# Patient Record
Sex: Female | Born: 1965
Health system: Southern US, Community
[De-identification: ages and names within clinical notes are randomized; demographics above are authoritative.]

## PROBLEM LIST (undated history)

## (undated) DIAGNOSIS — I10 Essential (primary) hypertension: Secondary | ICD-10-CM

## (undated) DIAGNOSIS — R7989 Other specified abnormal findings of blood chemistry: Secondary | ICD-10-CM

## (undated) DIAGNOSIS — J45909 Unspecified asthma, uncomplicated: Secondary | ICD-10-CM

## (undated) DIAGNOSIS — E7800 Pure hypercholesterolemia, unspecified: Secondary | ICD-10-CM

## (undated) DIAGNOSIS — I351 Nonrheumatic aortic (valve) insufficiency: Secondary | ICD-10-CM

## (undated) DIAGNOSIS — E119 Type 2 diabetes mellitus without complications: Secondary | ICD-10-CM

## (undated) DIAGNOSIS — Z95810 Presence of automatic (implantable) cardiac defibrillator: Secondary | ICD-10-CM

## (undated) DIAGNOSIS — Z950 Presence of cardiac pacemaker: Secondary | ICD-10-CM

## (undated) DIAGNOSIS — R778 Other specified abnormalities of plasma proteins: Secondary | ICD-10-CM

## (undated) HISTORY — PX: INSERT / REPLACE / REMOVE PACEMAKER: SUR710

## (undated) HISTORY — DX: Other specified abnormal findings of blood chemistry: R79.89

## (undated) HISTORY — DX: Other specified abnormalities of plasma proteins: R77.8

## (undated) HISTORY — PX: APPENDECTOMY: SHX54

## (undated) HISTORY — PX: PACEMAKER IMPLANT: EP1218

## (undated) HISTORY — PX: SINUS EXPLORATION: SHX5214

## (undated) HISTORY — PX: TUBAL LIGATION: SHX77

---
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
## 2006-09-13 DIAGNOSIS — I10 Essential (primary) hypertension: Secondary | ICD-10-CM | POA: Insufficient documentation

## 2006-09-13 DIAGNOSIS — J45909 Unspecified asthma, uncomplicated: Secondary | ICD-10-CM

## 2006-09-13 HISTORY — DX: Unspecified asthma, uncomplicated: J45.909

## 2012-10-10 ENCOUNTER — Emergency Department (HOSPITAL_COMMUNITY): Payer: Medicaid - Out of State

## 2012-10-10 ENCOUNTER — Emergency Department (HOSPITAL_COMMUNITY)
Admission: EM | Admit: 2012-10-10 | Discharge: 2012-10-10 | Disposition: A | Discharge status: Home / Self Care | Payer: Medicaid - Out of State | Attending: Emergency Medicine | Admitting: Emergency Medicine

## 2012-10-10 ENCOUNTER — Encounter (HOSPITAL_COMMUNITY): Payer: Self-pay | Admitting: Emergency Medicine

## 2012-10-10 DIAGNOSIS — IMO0002 Reserved for concepts with insufficient information to code with codable children: Secondary | ICD-10-CM

## 2012-10-10 DIAGNOSIS — Y9289 Other specified places as the place of occurrence of the external cause: Secondary | ICD-10-CM | POA: Insufficient documentation

## 2012-10-10 DIAGNOSIS — Z79899 Other long term (current) drug therapy: Secondary | ICD-10-CM | POA: Insufficient documentation

## 2012-10-10 DIAGNOSIS — Z8679 Personal history of other diseases of the circulatory system: Secondary | ICD-10-CM | POA: Insufficient documentation

## 2012-10-10 DIAGNOSIS — E7800 Pure hypercholesterolemia, unspecified: Secondary | ICD-10-CM | POA: Insufficient documentation

## 2012-10-10 DIAGNOSIS — I10 Essential (primary) hypertension: Secondary | ICD-10-CM | POA: Insufficient documentation

## 2012-10-10 DIAGNOSIS — Z791 Long term (current) use of non-steroidal anti-inflammatories (NSAID): Secondary | ICD-10-CM | POA: Insufficient documentation

## 2012-10-10 DIAGNOSIS — Z7982 Long term (current) use of aspirin: Secondary | ICD-10-CM | POA: Insufficient documentation

## 2012-10-10 DIAGNOSIS — Y93G1 Activity, food preparation and clean up: Secondary | ICD-10-CM | POA: Insufficient documentation

## 2012-10-10 DIAGNOSIS — I351 Nonrheumatic aortic (valve) insufficiency: Secondary | ICD-10-CM | POA: Insufficient documentation

## 2012-10-10 DIAGNOSIS — S61409A Unspecified open wound of unspecified hand, initial encounter: Secondary | ICD-10-CM | POA: Insufficient documentation

## 2012-10-10 DIAGNOSIS — W268XXA Contact with other sharp object(s), not elsewhere classified, initial encounter: Secondary | ICD-10-CM | POA: Insufficient documentation

## 2012-10-10 DIAGNOSIS — E119 Type 2 diabetes mellitus without complications: Secondary | ICD-10-CM | POA: Insufficient documentation

## 2012-10-10 HISTORY — DX: Type 2 diabetes mellitus without complications: E11.9

## 2012-10-10 HISTORY — DX: Nonrheumatic aortic (valve) insufficiency: I35.1

## 2012-10-10 HISTORY — DX: Pure hypercholesterolemia, unspecified: E78.00

## 2012-10-10 HISTORY — DX: Essential (primary) hypertension: I10

## 2012-10-10 NOTE — ED Provider Notes (Signed)
History    This chart was scribed for Jamse Mead, PA working with Janice Norrie, MD by ED Scribe, Leeroy Cha. This patient was seen in room TR11C/TR11C and the patient's care was started at 7:00 PM.   CSN: [id number]  Arrival date & time 10/10/12  1622   None     Chief Complaint  Patient presents with  . Extremity Laceration    (Consider location/radiation/quality/duration/timing/severity/associated sxs/prior treatment) HPI HPI Comments: Lisa Poole is a 47 y.o. female with h/o DM, HTN, and aortic insufficiency who presents to the Emergency Department complaining of moderate constant pain due to a laceration earlier today. Pt states that she was washing dishes in the sink and pulled her hand out and noticed a laceration on her left hand that she received from a broken glass. Bleeding is currently controlled in the ED. She reports a mild tingling sensation with throbbing pain. She gives the pain a 8/10 as of now. Pt takes aspirin (81mg ) daily. Pt had a previous surgery on her right hand and is left handed. Pt denies numbness in her left hand, fever, chills, cough, nausea, vomiting, diarrhea, SOB, weakness, and any other associated symptoms. Pt's Tetanus is utd and she is originally is from [place]. Here due to a sick family member.   Past Medical History  Diagnosis Date  . Hypertension   . Diabetes mellitus without complication   . Hypercholesteremia   . Aortic insufficiency     History reviewed. No pertinent past surgical history.  History reviewed. No pertinent family history.  History  Substance Use Topics  . Smoking status: Never Smoker   . Smokeless tobacco: Not on file  . Alcohol Use: Yes     Comment: occ    OB History   Grav Para Term Preterm Abortions TAB SAB Ect Mult Living                  Review of Systems  Constitutional: Negative for fever and chills.  Musculoskeletal: Negative for arthralgias.  Skin: Positive for wound.  Neurological: Negative for  dizziness and numbness.  All other systems reviewed and are negative.    Allergies  Lisinopril  Home Medications   Current Outpatient Rx  Name  Route  Sig  Dispense  Refill  . amitriptyline (ELAVIL) 50 MG tablet   Oral   Take 50 mg by mouth at bedtime.         Marland Kitchen aspirin EC 81 MG tablet   Oral   Take 81 mg by mouth daily.         . hydrochlorothiazide (HYDRODIURIL) 25 MG tablet   Oral   Take 25 mg by mouth daily.         Marland Kitchen losartan (COZAAR) 25 MG tablet   Oral   Take 25 mg by mouth daily.         Marland Kitchen lovastatin (MEVACOR) 20 MG tablet   Oral   Take 20 mg by mouth at bedtime.         . meloxicam (MOBIC) 15 MG tablet   Oral   Take 15 mg by mouth daily.         . metoprolol (LOPRESSOR) 50 MG tablet   Oral   Take 50 mg by mouth 2 (two) times daily.         Marland Kitchen venlafaxine (EFFEXOR) 100 MG tablet   Oral   Take 300 mg by mouth every morning.  BP 122/92  Pulse 76  Temp(Src) 98 F (36.7 C) (Oral)  Resp 18  SpO2 96%  Physical Exam  Nursing note and vitals reviewed. Constitutional: She is oriented to person, place, and time. She appears well-developed and well-nourished. No distress.  HENT:  Head: Normocephalic and atraumatic.  Mouth/Throat: Oropharynx is clear and moist. No oropharyngeal exudate.  Eyes: Conjunctivae and EOM are normal. Pupils are equal, round, and reactive to light.  Neck: Normal range of motion. Neck supple. No tracheal deviation present.  Cardiovascular: Normal rate, regular rhythm, normal heart sounds and intact distal pulses.  Exam reveals no gallop.   No murmur heard. Pulmonary/Chest: Effort normal and breath sounds normal. No respiratory distress. She has no wheezes. She has no rales.  Abdominal: Soft. Bowel sounds are normal. There is no tenderness.  Musculoskeletal: Normal range of motion. She exhibits tenderness.  Lymphadenopathy:    She has no cervical adenopathy.  Neurological: She is alert and oriented to  person, place, and time. She has normal reflexes. No cranial nerve deficit. She exhibits normal muscle tone. Coordination normal.  Sensation intact with dull and sharp sensations.  Skin: Skin is warm and dry.  U-shaped laceration to the base of left pinky finger on dorsal aspect of left hand  Psychiatric: She has a normal mood and affect. Her behavior is normal.    ED Course  Procedures (including critical care time) DIAGNOSTIC STUDIES: Oxygen Saturation is 96% on room air, adequate by my interpretation.    COORDINATION OF CARE: 2:20 AM Discussed ED treatment with pt and pt agrees.   LACERATION REPAIR Performed by: Jamse Mead Authorized by: Jamse Mead Consent: Verbal consent obtained. Risks and benefits: risks, benefits and alternatives were discussed Consent given by: patient Patient identity confirmed: provided demographic data Prepped and Draped in normal sterile fashion Wound explored  Laceration Location: lateral aspect of left hand bas eof pinky  Laceration Length: 2cm U-shaped  No Foreign Bodies seen or palpated  Anesthesia: local infiltration  Local anesthetic: lidocaine 2% without epinephrine  Anesthetic total: 6 ml  Irrigation method: syringe Amount of cleaning: standard  Skin closure: approximate  Number of sutures: 7  Technique: single interrupted, 4-0 ethilon  Patient tolerance: Patient tolerated the procedure well with no immediate complications.   Labs Reviewed - No data to display Dg Hand Complete Left  10/10/2012   *RADIOLOGY REPORT*  Clinical Data: Laceration of the medial surface of the hand.  LEFT HAND - COMPLETE 3+ VIEW  Comparison: None.  Findings: No radiopaque foreign body is identified.  No fracture. Alignment of the left hand is anatomic.  Visualized carpal bones appear normal.  Bandages present over the ulnar aspect of the fifth MCP joint.  IMPRESSION: No acute osseous abnormality or radiopaque foreign body.   Original Report  Authenticated By: Dereck Ligas, M.D.     1. Laceration       MDM  I personally performed the services described in this documentation, which was scribed in my presence. The recorded information has been reviewed and is accurate.  No neurovascular damage noted. Strength 5/5 to left hand and fingers. Full ROM to the left hand - mild discomfort with motion to the left pinky secondary to pain. Patient tolerate procedure well. Good hemostasis. Negative foreign bodies noted. Tendon and deep tendon involvement not noted. Patient stable, afebrile. Discharged patient. Discussed wound care. Discussed removal of sutures in 7-8 days. Referred to hand surgeon. Discussed with patient to monitor symptoms and if symptoms are to worsen  or change to report back to the ED. Patient agreed to plan of care, understood, all questions answered.    [employer], PA-C 10/11/12 0221

## 2012-10-10 NOTE — ED Notes (Signed)
Pt c/o left hand laceration from glass; bleeding controlled

## 2012-10-11 NOTE — ED Provider Notes (Signed)
Medical screening examination/treatment/procedure(s) were performed by non-physician practitioner and as supervising physician I was immediately available for consultation/collaboration. Rolland Porter, MD, Abram Sander   Janice Norrie, MD 10/11/12 1240

## 2012-10-15 ENCOUNTER — Encounter (HOSPITAL_COMMUNITY): Payer: Self-pay | Admitting: Adult Health

## 2012-10-15 ENCOUNTER — Emergency Department (HOSPITAL_COMMUNITY)
Admission: EM | Admit: 2012-10-15 | Discharge: 2012-10-15 | Disposition: A | Discharge status: Home / Self Care | Payer: Medicaid - Out of State | Attending: Emergency Medicine | Admitting: Emergency Medicine

## 2012-10-15 DIAGNOSIS — E119 Type 2 diabetes mellitus without complications: Secondary | ICD-10-CM | POA: Insufficient documentation

## 2012-10-15 DIAGNOSIS — Z79899 Other long term (current) drug therapy: Secondary | ICD-10-CM | POA: Insufficient documentation

## 2012-10-15 DIAGNOSIS — R209 Unspecified disturbances of skin sensation: Secondary | ICD-10-CM | POA: Insufficient documentation

## 2012-10-15 DIAGNOSIS — Z4802 Encounter for removal of sutures: Secondary | ICD-10-CM

## 2012-10-15 DIAGNOSIS — Z7982 Long term (current) use of aspirin: Secondary | ICD-10-CM | POA: Insufficient documentation

## 2012-10-15 DIAGNOSIS — Z8679 Personal history of other diseases of the circulatory system: Secondary | ICD-10-CM | POA: Insufficient documentation

## 2012-10-15 DIAGNOSIS — Z4801 Encounter for change or removal of surgical wound dressing: Secondary | ICD-10-CM | POA: Insufficient documentation

## 2012-10-15 DIAGNOSIS — I10 Essential (primary) hypertension: Secondary | ICD-10-CM | POA: Insufficient documentation

## 2012-10-15 DIAGNOSIS — E7800 Pure hypercholesterolemia, unspecified: Secondary | ICD-10-CM | POA: Insufficient documentation

## 2012-10-15 DIAGNOSIS — Z791 Long term (current) use of non-steroidal anti-inflammatories (NSAID): Secondary | ICD-10-CM | POA: Insufficient documentation

## 2012-10-15 NOTE — ED Provider Notes (Signed)
History  This chart was scribed for non-physician practitioner Abigail Butts, PA-C working with Johnna Acosta, MD, by Truddie Coco, ED Scribe. This patient was seen in room TR08C/TR08C and the patient's care was started at 5:30 PM   CSN: [id number]  Arrival date & time 10/15/12  1648   First MD Initiated Contact with Patient 10/15/12 1715      Chief Complaint  Patient presents with  . Hand Pain     The history is provided by the patient. No language interpreter was used.  HPI Comments: Lisa Poole is a 47 y.o. female who presents to the Emergency Department complaining of left fifth finger numbness and pain that started two days ago which she reports she feels when touching the finger and bending the finger.  Pt had sutures put in place to the base of the left fifth finger five days ago after she cut the hand on a piece of glass.  She denies constant tingling.  Pt has not followed up with a hand specialist because she is visiting from out of town.    Past Medical History  Diagnosis Date  . Hypertension   . Diabetes mellitus without complication   . Hypercholesteremia   . Aortic insufficiency     History reviewed. No pertinent past surgical history.  History reviewed. No pertinent family history.  History  Substance Use Topics  . Smoking status: Never Smoker   . Smokeless tobacco: Not on file  . Alcohol Use: Yes     Comment: occ    OB History   Grav Para Term Preterm Abortions TAB SAB Ect Mult Living                  Review of Systems  Skin:       Sutures in place to dorsal left hand, base of fifth finger.   Neurological: Positive for numbness (left fifth finger).  All other systems reviewed and are negative.    Allergies  Lisinopril  Home Medications   Current Outpatient Rx  Name  Route  Sig  Dispense  Refill  . amitriptyline (ELAVIL) 50 MG tablet   Oral   Take 50 mg by mouth at bedtime.         Marland Kitchen aspirin EC 81 MG tablet   Oral   Take  81 mg by mouth daily.         . hydrochlorothiazide (HYDRODIURIL) 25 MG tablet   Oral   Take 25 mg by mouth daily.         Marland Kitchen losartan (COZAAR) 25 MG tablet   Oral   Take 25 mg by mouth daily.         Marland Kitchen lovastatin (MEVACOR) 20 MG tablet   Oral   Take 20 mg by mouth at bedtime.         . meloxicam (MOBIC) 15 MG tablet   Oral   Take 15 mg by mouth daily.         . metoprolol (LOPRESSOR) 50 MG tablet   Oral   Take 50 mg by mouth 2 (two) times daily.         Marland Kitchen venlafaxine (EFFEXOR) 100 MG tablet   Oral   Take 300 mg by mouth every morning.           BP 143/92  Pulse 77  Temp(Src) 97.8 F (36.6 C) (Oral)  Resp 16  SpO2 98%  Physical Exam  Nursing note and vitals reviewed. Constitutional: She appears  well-developed and well-nourished. No distress.  HENT:  Head: Normocephalic and atraumatic.  Mouth/Throat: Oropharynx is clear and moist. No oropharyngeal exudate.  Eyes: Conjunctivae are normal. No scleral icterus.  Neck: Normal range of motion. Neck supple.  Cardiovascular: Normal rate, regular rhythm and intact distal pulses.   Pulmonary/Chest: Effort normal and breath sounds normal. No respiratory distress. She has no wheezes.  Abdominal: Soft. Bowel sounds are normal. She exhibits no mass. There is no tenderness. There is no rebound and no guarding.  Musculoskeletal: Normal range of motion. She exhibits no edema.  Neurological: She is alert.  Speech is clear and goal oriented Moves extremities without ataxia Sensation intact to the distal left pinky finger.   Skin: Skin is warm and dry. She is not diaphoretic.  Well healed 2 cm U-shaped laceration.  No erythema or drainage.  Minimal pain to palpation.    Psychiatric: She has a normal mood and affect.    ED Course  Procedures  DIAGNOSTIC STUDIES: Oxygen Saturation is 98% on room air, normal by my interpretation.    COORDINATION OF CARE:  5:33 PM Discussed course of care with pt which includes  suture removal.  Pt understands and agrees.   SUTURE REMOVAL Performed by: Abigail Butts, PA-C Authorized by: Abigail Butts, PA-C Consent: Verbal consent obtained. Consent given by: patient Required items: required blood products, implants, devices, and special equipment available  Time out: Immediately prior to procedure a "time out" was called to verify the correct patient, procedure, equipment, support staff and site/side marked as required. Location: dorsal left hand base of fifth finger Wound Appearance: well healing  Sutures Removed: 7 sutures  Post-removal: no erythema or drainage.  Patient tolerance: Patient tolerated the procedure well with no immediate complications.   Labs Reviewed - No data to display  No results found.   1. Visit for suture removal       MDM  Lisa Poole presents for staple/suture removal and wound check as above. Procedure tolerated well. Vitals normal, no signs of infection. Scar minimization & return precautions given at dc. I have also discussed reasons to return immediately to the ER.  Patient expresses understanding and agrees with plan.  I personally performed the services described in this documentation, which was scribed in my presence. The recorded information has been reviewed and is accurate.        Jarrett Soho Kasyn Stouffer, PA-C 10/15/12 1736

## 2012-10-15 NOTE — ED Notes (Signed)
Presents with left pinky numbness/pain. Pt had stitches placed on June 2nd from injury on left knuckle of pinky. The finger pain began 2 days ago. Pain is described as shooting and numbness and began Tuesday. Touch makes pain worse. CMS intact

## 2012-10-16 NOTE — ED Provider Notes (Signed)
  Medical screening examination/treatment/procedure(s) were performed by non-physician practitioner and as supervising physician I was immediately available for consultation/collaboration.    Johnna Acosta, MD 10/16/12 2013

## 2013-08-01 DIAGNOSIS — M19019 Primary osteoarthritis, unspecified shoulder: Secondary | ICD-10-CM | POA: Insufficient documentation

## 2014-05-10 ENCOUNTER — Encounter (HOSPITAL_COMMUNITY): Payer: Self-pay | Admitting: *Deleted

## 2014-05-10 ENCOUNTER — Emergency Department (HOSPITAL_COMMUNITY)
Admission: EM | Admit: 2014-05-10 | Discharge: 2014-05-10 | Disposition: A | Discharge status: Home / Self Care | Payer: Medicare Other | Attending: Emergency Medicine | Admitting: Emergency Medicine

## 2014-05-10 DIAGNOSIS — Z7982 Long term (current) use of aspirin: Secondary | ICD-10-CM | POA: Diagnosis not present

## 2014-05-10 DIAGNOSIS — Z79899 Other long term (current) drug therapy: Secondary | ICD-10-CM | POA: Diagnosis not present

## 2014-05-10 DIAGNOSIS — E119 Type 2 diabetes mellitus without complications: Secondary | ICD-10-CM | POA: Diagnosis not present

## 2014-05-10 DIAGNOSIS — Y9389 Activity, other specified: Secondary | ICD-10-CM | POA: Diagnosis not present

## 2014-05-10 DIAGNOSIS — Z791 Long term (current) use of non-steroidal anti-inflammatories (NSAID): Secondary | ICD-10-CM | POA: Insufficient documentation

## 2014-05-10 DIAGNOSIS — E780 Pure hypercholesterolemia: Secondary | ICD-10-CM | POA: Diagnosis not present

## 2014-05-10 DIAGNOSIS — Y9289 Other specified places as the place of occurrence of the external cause: Secondary | ICD-10-CM | POA: Diagnosis not present

## 2014-05-10 DIAGNOSIS — R600 Localized edema: Secondary | ICD-10-CM | POA: Diagnosis present

## 2014-05-10 DIAGNOSIS — S030XXA Dislocation of jaw, initial encounter: Secondary | ICD-10-CM | POA: Diagnosis not present

## 2014-05-10 DIAGNOSIS — S0300XA Dislocation of jaw, unspecified side, initial encounter: Secondary | ICD-10-CM

## 2014-05-10 DIAGNOSIS — Y998 Other external cause status: Secondary | ICD-10-CM | POA: Insufficient documentation

## 2014-05-10 DIAGNOSIS — I10 Essential (primary) hypertension: Secondary | ICD-10-CM | POA: Diagnosis not present

## 2014-05-10 DIAGNOSIS — X58XXXA Exposure to other specified factors, initial encounter: Secondary | ICD-10-CM | POA: Insufficient documentation

## 2014-05-10 NOTE — ED Notes (Signed)
Pt states that she has had rt sided jaw swelling radiating to her ear for 7 days; pt c/o tenderness to palpation but denies any other discomfort; pt states that she thought something may have bitten her but has not seen a bite mark; pt concerned that has persisted for 7 days

## 2014-05-10 NOTE — ED Provider Notes (Signed)
CSN: [id number]     Arrival date & time 05/10/14  1955 History  This chart was scribed for non-physician practitioner, Charlann Lange, PA-C,working with Mirna Mires, MD, by Marlowe Kays, ED Scribe. This patient was seen in room [place] and the patient's care was started at 8:33 PM.  Chief Complaint  Patient presents with  . Facial Swelling   The history is provided by the patient. No language interpreter was used.    HPI Comments:  Lisa Poole is a 48 y.o. obese female who presents to the Emergency Department complaining of right-sided facial swelling that began approximately 7 days ago. She reports associated swelling around her right ear as well. She states opening her jaw causes moderate pain but is unpainful other than that. Pt has not done anything to treat her symptoms besides her normal Meloxicam she takes for joint pain. Denies alleviating factors. Denies fever, chills, dental problems, nausea or vomiting. Denies trauma, injury or fall. PMH of HTN, DM and hypercholesteremia.  Past Medical History  Diagnosis Date  . Hypertension   . Diabetes mellitus without complication   . Hypercholesteremia   . Aortic insufficiency    Past Surgical History  Procedure Laterality Date  . Tubal ligation    . Appendectomy    . Sinus exploration     No family history on file. History  Substance Use Topics  . Smoking status: Never Smoker   . Smokeless tobacco: Not on file  . Alcohol Use: Yes     Comment: occ   OB History    No data available     Review of Systems  Constitutional: Negative for fever and chills.  HENT: Positive for facial swelling. Negative for dental problem.   Gastrointestinal: Negative for nausea and vomiting.  All other systems reviewed and are negative.   Allergies  Lisinopril and Lotensin  Home Medications   Prior to Admission medications   Medication Sig Start Date End Date Taking? Authorizing Provider  amitriptyline (ELAVIL) 50 MG tablet Take  50 mg by mouth at bedtime.    Historical Provider, MD  aspirin EC 81 MG tablet Take 81 mg by mouth daily.    Historical Provider, MD  hydrochlorothiazide (HYDRODIURIL) 25 MG tablet Take 25 mg by mouth daily.    Historical Provider, MD  losartan (COZAAR) 25 MG tablet Take 25 mg by mouth daily.    Historical Provider, MD  lovastatin (MEVACOR) 20 MG tablet Take 20 mg by mouth at bedtime.    Historical Provider, MD  meloxicam (MOBIC) 15 MG tablet Take 15 mg by mouth daily.    Historical Provider, MD  metoprolol (LOPRESSOR) 50 MG tablet Take 50 mg by mouth 2 (two) times daily.    Historical Provider, MD  venlafaxine (EFFEXOR) 100 MG tablet Take 300 mg by mouth every morning.    Historical Provider, MD   Triage Vitals: BP 142/89 mmHg  Pulse 78  Temp(Src) 98.3 F (36.8 C) (Oral)  Resp 18  Ht 5' 0.5" (1.537 m)  Wt 214 lb (97.07 kg)  BMI 41.09 kg/m2  SpO2 96%  LMP 04/29/2014 Physical Exam  Constitutional: She is oriented to person, place, and time. She appears well-developed and well-nourished.  HENT:  Head: Normocephalic and atraumatic.  Right TMJ swelling without redness. Joint tender. No malocclusion. Right upper and lower molars absent, no intraoral tenderness. TM's clear bilaterally.   Eyes: EOM are normal.  Neck: Normal range of motion.  Cardiovascular: Normal rate.   Pulmonary/Chest: Effort normal.  Musculoskeletal: Normal range of motion.  Lymphadenopathy:    She has no cervical adenopathy.  Neurological: She is alert and oriented to person, place, and time.  Skin: Skin is warm and dry.  Psychiatric: She has a normal mood and affect. Her behavior is normal.  Nursing note and vitals reviewed.   ED Course  Procedures (including critical care time) DIAGNOSTIC STUDIES: Oxygen Saturation is 96% on RA, adequate by my interpretation.   COORDINATION OF CARE: 8:36 PM- Advised pt to apply warm compresses and continue Meloxicam. Will give referral to oral surgeon. Pt verbalizes  understanding and agrees to plan.  Medications - No data to display  Labs Review Labs Reviewed - No data to display  Imaging Review No results found.   EKG Interpretation None      MDM   Final diagnoses:  None    1. TMJ syndrome  Uncomplicated TMJ. No evidence of infection. No dental issues. Already taking max dose Mobic. Encouraged oral surgery follow up, warm compresses.   I personally performed the services described in this documentation, which was scribed in my presence. The recorded information has been reviewed and is accurate.    Dewaine Oats, PA-C 05/10/14 2051  Mirna Mires, MD 05/10/14 2142

## 2014-05-10 NOTE — Discharge Instructions (Signed)
Heat Therapy Heat therapy can help ease sore, stiff, injured, and tight muscles and joints. Heat relaxes your muscles, which may help ease your pain.  RISKS AND COMPLICATIONS If you have any of the following conditions, do not use heat therapy unless your health care provider has approved:  Poor circulation.  Healing wounds or scarred skin in the area being treated.  Diabetes, heart disease, or high blood pressure.  Not being able to feel (numbness) the area being treated.  Unusual swelling of the area being treated.  Active infections.  Blood clots.  Cancer.  Inability to communicate pain. This may include young children and people who have problems with their brain function (dementia).  Pregnancy. Heat therapy should only be used on old, pre-existing, or long-lasting (chronic) injuries. Do not use heat therapy on new injuries unless directed by your health care provider. HOW TO USE HEAT THERAPY There are several different kinds of heat therapy, including:  Moist heat pack.  Warm water bath.  Hot water bottle.  Electric heating pad.  Heated gel pack.  Heated wrap.  Electric heating pad. Use the heat therapy method suggested by your health care provider. Follow your health care provider's instructions on when and how to use heat therapy. GENERAL HEAT THERAPY RECOMMENDATIONS  Do not sleep while using heat therapy. Only use heat therapy while you are awake.  Your skin may turn pink while using heat therapy. Do not use heat therapy if your skin turns red.  Do not use heat therapy if you have new pain.  High heat or long exposure to heat can cause burns. Be careful when using heat therapy to avoid burning your skin.  Do not use heat therapy on areas of your skin that are already irritated, such as with a rash or sunburn. SEEK MEDICAL CARE IF:  You have blisters, redness, swelling, or numbness.  You have new pain.  Your pain is worse. MAKE SURE  YOU:  Understand these instructions.  Will watch your condition.  Will get help right away if you are not doing well or get worse. Document Released: 07/20/2011 Document Revised: 09/11/2013 Document Reviewed: 06/20/2013 [facility] Patient Information 2015 [place], [place]. This information is not intended to replace advice given to you by your health care provider. Make sure you discuss any questions you have with your health care provider. Temporomandibular Problems  Temporomandibular joint (TMJ) dysfunction means there are problems with the joint between your jaw and your skull. This is a joint lined by cartilage like other joints in your body but also has a small disc in the joint which keeps the bones from rubbing on each other. These joints are like other joints and can get inflamed (sore) from arthritis and other problems. When this joint gets sore, it can cause headaches and pain in the jaw and the face. CAUSES  Usually the arthritic types of problems are caused by soreness in the joint. Soreness in the joint can also be caused by overuse. This may come from grinding your teeth. It may also come from mis-alignment in the joint. DIAGNOSIS Diagnosis of this condition can often be made by history and exam. Sometimes your caregiver may need X-rays or an MRI scan to determine the exact cause. It may be necessary to see your dentist to determine if your teeth and jaws are lined up correctly. TREATMENT  Most of the time this problem is not serious; however, sometimes it can persist (become chronic). When this happens medications that will cut  down on inflammation (soreness) help. Sometimes a shot of cortisone into the joint will be helpful. If your teeth are not aligned it may help for your dentist to make a splint for your mouth that can help this problem. If no physical problems can be found, the problem may come from tension. If tension is found to be the cause, biofeedback or relaxation techniques  may be helpful. HOME CARE INSTRUCTIONS   Later in the day, applications of ice packs may be helpful. Ice can be used in a plastic bag with a towel around it to prevent frostbite to skin. This may be used about every 2 hours for 20 to 30 minutes, as needed while awake, or as directed by your caregiver.  Only take over-the-counter or prescription medicines for pain, discomfort, or fever as directed by your caregiver.  If physical therapy was prescribed, follow your caregiver's directions.  Wear mouth appliances as directed if they were given. Document Released: 01/20/2001 Document Revised: 07/20/2011 Document Reviewed: 04/29/2008 [facility] Patient Information 2015 [place], [place]. This information is not intended to replace advice given to you by your health care provider. Make sure you discuss any questions you have with your health care provider.

## 2014-12-04 ENCOUNTER — Other Ambulatory Visit: Payer: Self-pay

## 2014-12-04 DIAGNOSIS — Z1231 Encounter for screening mammogram for malignant neoplasm of breast: Secondary | ICD-10-CM

## 2014-12-06 ENCOUNTER — Other Ambulatory Visit: Payer: Self-pay | Admitting: Internal Medicine

## 2014-12-06 DIAGNOSIS — E2839 Other primary ovarian failure: Secondary | ICD-10-CM

## 2014-12-13 ENCOUNTER — Ambulatory Visit
Admission: RE | Admit: 2014-12-13 | Discharge: 2014-12-13 | Disposition: A | Discharge status: Home / Self Care | Payer: Medicare Other | Source: Ambulatory Visit

## 2014-12-13 DIAGNOSIS — Z1231 Encounter for screening mammogram for malignant neoplasm of breast: Secondary | ICD-10-CM

## 2015-05-12 HISTORY — PX: ROTATOR CUFF REPAIR: SHX139

## 2015-12-30 ENCOUNTER — Emergency Department (HOSPITAL_COMMUNITY)
Admission: EM | Admit: 2015-12-30 | Discharge: 2015-12-30 | Disposition: A | Discharge status: Home / Self Care | Payer: Medicare Other | Attending: Emergency Medicine | Admitting: Emergency Medicine

## 2015-12-30 ENCOUNTER — Encounter (HOSPITAL_COMMUNITY): Payer: Self-pay | Admitting: *Deleted

## 2015-12-30 DIAGNOSIS — I10 Essential (primary) hypertension: Secondary | ICD-10-CM | POA: Insufficient documentation

## 2015-12-30 DIAGNOSIS — N760 Acute vaginitis: Secondary | ICD-10-CM | POA: Insufficient documentation

## 2015-12-30 DIAGNOSIS — Z79899 Other long term (current) drug therapy: Secondary | ICD-10-CM | POA: Diagnosis not present

## 2015-12-30 DIAGNOSIS — E119 Type 2 diabetes mellitus without complications: Secondary | ICD-10-CM | POA: Insufficient documentation

## 2015-12-30 DIAGNOSIS — N898 Other specified noninflammatory disorders of vagina: Secondary | ICD-10-CM | POA: Diagnosis present

## 2015-12-30 DIAGNOSIS — Z7982 Long term (current) use of aspirin: Secondary | ICD-10-CM | POA: Insufficient documentation

## 2015-12-30 LAB — URINALYSIS, ROUTINE W REFLEX MICROSCOPIC
Bilirubin Urine: NEGATIVE
Glucose, UA: >1000 mg/dL — AB
KETONES UR: NEGATIVE mg/dL
NITRITE: NEGATIVE
PH: 5.5 (ref 5.0–8.0)
PROTEIN: NEGATIVE mg/dL
Specific Gravity, Urine: 1.036 — ABNORMAL HIGH (ref 1.005–1.030)

## 2015-12-30 LAB — URINE MICROSCOPIC-ADD ON

## 2015-12-30 LAB — WET PREP, GENITAL
Clue Cells Wet Prep HPF POC: NONE SEEN
Sperm: NONE SEEN
Trich, Wet Prep: NONE SEEN
Yeast Wet Prep HPF POC: NONE SEEN

## 2015-12-30 MED ORDER — FLUCONAZOLE 100 MG PO TABS
150.0000 mg | ORAL_TABLET | Freq: Once | ORAL | Status: AC
  Administered 2015-12-30: 150 mg via ORAL
  Filled 2015-12-30: qty 2

## 2015-12-30 MED ORDER — FLUCONAZOLE 150 MG PO TABS: 150.0000 mg | ORAL_TABLET | ORAL | 0 refills | Status: AC

## 2015-12-30 NOTE — Discharge Instructions (Signed)
Please take the medicine prescribed on Thursday and Sunday if not better. See the [facility] doctor as requested for optimal care - [facility].

## 2015-12-30 NOTE — ED Provider Notes (Signed)
[facility] DEPT Provider Note   CSN: [id number] Arrival date & time: 12/30/15  1303     History   Chief Complaint Chief Complaint  Patient presents with  . Vaginal Itching    HPI Lisa Poole is a 50 y.o. female.  HPI Pt with DM hx comes in with soreness in the vaginal region. Reports that she has constant discomfort in the vaginal region with burning with urination. Pt denies any vaginal discharge, bleeding. No hx of similar pain in the past. No one is accepting her insurance, so she hasnt been able to see a Gyne. Symptoms present for 1 week. No n/v/f/c.  Past Medical History:  Diagnosis Date  . Aortic insufficiency   . Diabetes mellitus without complication ([place])   . Hypercholesteremia   . Hypertension     Patient Active Problem List   Diagnosis Date Noted  . Aortic insufficiency     Past Surgical History:  Procedure Laterality Date  . APPENDECTOMY    . SINUS EXPLORATION    . TUBAL LIGATION      OB History    No data available       Home Medications    Prior to Admission medications   Medication Sig Start Date End Date Taking? Authorizing Provider  aspirin EC 81 MG tablet Take 81 mg by mouth every evening.    Yes Historical Provider, MD  gabapentin (NEURONTIN) 300 MG capsule Take 300 mg by mouth 2 (two) times daily. 10/29/15  Yes Historical Provider, MD  INVOKAMET XR 50-500 MG TB24 Take 1 tablet by mouth 2 (two) times daily. 12/12/15  Yes Historical Provider, MD  losartan-hydrochlorothiazide (HYZAAR) 100-12.5 MG tablet Take 1 tablet by mouth every morning. 12/23/15  Yes Historical Provider, MD  lovastatin (MEVACOR) 20 MG tablet Take 20 mg by mouth at bedtime.   Yes Historical Provider, MD  meloxicam (MOBIC) 15 MG tablet Take 15 mg by mouth daily.   Yes Historical Provider, MD  metoprolol (LOPRESSOR) 50 MG tablet Take 50 mg by mouth 2 (two) times daily.   Yes Historical Provider, MD  OVER THE COUNTER MEDICATION Take 2 tablets by mouth daily. Amberen for  menopause symptoms   Yes Historical Provider, MD  venlafaxine XR (EFFEXOR-XR) 150 MG 24 hr capsule Take 300 mg by mouth every morning. 12/12/15  Yes Historical Provider, MD  fluconazole (DIFLUCAN) 150 MG tablet Take 1 tablet (150 mg total) by mouth every 3 (three) days. 12/30/15 01/01/16  Varney Biles, MD    Family History History reviewed. No pertinent family history.  Social History Social History  Substance Use Topics  . Smoking status: Never Smoker  . Smokeless tobacco: Not on file  . Alcohol use Yes     Comment: occ     Allergies   Ace inhibitors; Benazepril; Lisinopril; Lotensin [benazepril hcl]; and Sulfa antibiotics   Review of Systems Review of Systems  ROS 10 Systems reviewed and are negative for acute change except as noted in the HPI.     Physical Exam Updated Vital Signs BP 110/72   Pulse 64   Temp 97.6 F (36.4 C) (Oral)   Resp 18   SpO2 99%   Physical Exam  Constitutional: She is oriented to person, place, and time. She appears well-developed.  HENT:  Head: Normocephalic and atraumatic.  Eyes: EOM are normal.  Neck: Normal range of motion. Neck supple.  Cardiovascular: Normal rate.   Pulmonary/Chest: Effort normal.  Abdominal: Bowel sounds are normal.  Genitourinary:  Genitourinary Comments:  Pt has vaginal irritation/erythema and tenderness to examination. There is some white discharge appreciated.  Neurological: She is alert and oriented to person, place, and time.  Skin: Skin is warm and dry.  Nursing note and vitals reviewed.    ED Treatments / Results  Labs (all labs ordered are listed, but only abnormal results are displayed) Labs Reviewed  WET PREP, GENITAL - Abnormal; Notable for the following:       Result Value   WBC, Wet Prep HPF POC MANY (*)    All other components within normal limits  URINALYSIS, ROUTINE W REFLEX MICROSCOPIC (NOT AT [facility]) - Abnormal; Notable for the following:    APPearance HAZY (*)    Specific Gravity,  Urine 1.036 (*)    Glucose, UA >1000 (*)    Hgb urine dipstick TRACE (*)    Leukocytes, UA SMALL (*)    All other components within normal limits  URINE MICROSCOPIC-ADD ON - Abnormal; Notable for the following:    Squamous Epithelial / LPF 6-30 (*)    Bacteria, UA FEW (*)    All other components within normal limits  URINE CULTURE    EKG  EKG Interpretation None       Radiology No results found.  Procedures Procedures (including critical care time)  Medications Ordered in ED Medications  fluconazole (DIFLUCAN) tablet 150 mg (not administered)     Initial Impression / Assessment and Plan / ED Course  I have reviewed the triage vital signs and the nursing notes.  Pertinent labs & imaging results that were available during my care of the patient were reviewed by me and considered in my medical decision making (see chart for details).  Clinical Course   Pt with vaginal itching and soreness. Likely candida vaginitis given the aggressive manifestation and discharge. Atrophic vaginitis also possible. Will tx with diflucan and advise Gyne f/u for optimal care.  Final Clinical Impressions(s) / ED Diagnoses   Final diagnoses:  Vaginitis and vulvovaginitis    New Prescriptions New Prescriptions   FLUCONAZOLE (DIFLUCAN) 150 MG TABLET    Take 1 tablet (150 mg total) by mouth every 3 (three) days.     Varney Biles, MD 12/30/15 [phone]

## 2015-12-30 NOTE — ED Triage Notes (Signed)
Pt reports vaginal itching and irritation for several days.

## 2015-12-31 LAB — URINE CULTURE

## 2016-01-08 ENCOUNTER — Other Ambulatory Visit: Payer: Self-pay | Admitting: Nurse Practitioner

## 2016-01-08 DIAGNOSIS — Z1231 Encounter for screening mammogram for malignant neoplasm of breast: Secondary | ICD-10-CM

## 2016-01-24 ENCOUNTER — Ambulatory Visit
Admission: RE | Admit: 2016-01-24 | Discharge: 2016-01-24 | Disposition: A | Discharge status: Home / Self Care | Payer: Medicare Other | Source: Ambulatory Visit | Attending: Nurse Practitioner | Admitting: Nurse Practitioner

## 2016-01-24 DIAGNOSIS — Z1231 Encounter for screening mammogram for malignant neoplasm of breast: Secondary | ICD-10-CM

## 2016-03-02 ENCOUNTER — Encounter: Payer: Self-pay | Admitting: Obstetrics & Gynecology

## 2016-03-02 ENCOUNTER — Ambulatory Visit (INDEPENDENT_AMBULATORY_CARE_PROVIDER_SITE_OTHER): Payer: Medicare Other | Admitting: Obstetrics & Gynecology

## 2016-03-02 ENCOUNTER — Other Ambulatory Visit (HOSPITAL_COMMUNITY)
Admission: RE | Admit: 2016-03-02 | Discharge: 2016-03-02 | Disposition: A | Discharge status: Home / Self Care | Payer: Medicare Other | Source: Ambulatory Visit | Attending: Obstetrics & Gynecology | Admitting: Obstetrics & Gynecology

## 2016-03-02 VITALS — BP 124/77 | HR 67 | Ht 60.0 in | Wt 211.0 lb

## 2016-03-02 DIAGNOSIS — Z23 Encounter for immunization: Secondary | ICD-10-CM

## 2016-03-02 DIAGNOSIS — R300 Dysuria: Secondary | ICD-10-CM | POA: Diagnosis not present

## 2016-03-02 DIAGNOSIS — Z124 Encounter for screening for malignant neoplasm of cervix: Secondary | ICD-10-CM

## 2016-03-02 DIAGNOSIS — Z01419 Encounter for gynecological examination (general) (routine) without abnormal findings: Secondary | ICD-10-CM | POA: Diagnosis present

## 2016-03-02 DIAGNOSIS — Z113 Encounter for screening for infections with a predominantly sexual mode of transmission: Secondary | ICD-10-CM | POA: Diagnosis present

## 2016-03-02 DIAGNOSIS — Z1151 Encounter for screening for human papillomavirus (HPV): Secondary | ICD-10-CM | POA: Insufficient documentation

## 2016-03-02 DIAGNOSIS — N852 Hypertrophy of uterus: Secondary | ICD-10-CM

## 2016-03-02 LAB — HEPATITIS C ANTIBODY: HCV AB: NEGATIVE

## 2016-03-02 LAB — HIV ANTIBODY (ROUTINE TESTING W REFLEX): HIV 1&2 Ab, 4th Generation: NONREACTIVE

## 2016-03-02 LAB — HEPATITIS B SURFACE ANTIGEN: HEP B S AG: NEGATIVE

## 2016-03-02 NOTE — Progress Notes (Signed)
Subjective:     KAYLI BEAL is a 50 y.o. female here for a routine exam.  LMP Mar 2017.  Current complaints: Pt reports c/o itching and burning in the vaginal area.  .     Gynecologic History Patient's last menstrual period was 07/25/2015 (exact date). Contraception: tubal ligation Last Pap: 4 years prev. Results were: normal. H/o abnormal PAP with HPV >20 years prev. Pt s/p cryo. No problems since that time Last mammogram: 9/15/. Results were: normal  Obstetric History OB History  Gravida Para Term Preterm AB Living  5 4 1 3 1 4   SAB TAB Ectopic Multiple Live Births  1 0 0 0 4    # Outcome Date GA Lbr Len/2nd Weight Sex Delivery Anes PTL Lv  5 SAB           4 Preterm           3 Preterm           2 Preterm           1 Term              The following portions of the patient's history were reviewed and updated as appropriate: allergies, current medications, past family history, past medical history, past social history, past surgical history and problem list.  Review of Systems Pertinent items are noted in HPI.    Objective:   BP 124/77   Pulse 67   Ht 5' (1.524 m)   Wt 211 lb (95.7 kg)   LMP 07/25/2015 (Exact Date)   BMI 41.21 kg/m  General Appearance:    Alert, cooperative, no distress, appears stated age  Head:    Normocephalic, without obvious abnormality, atraumatic  Eyes:    conjunctiva/corneas clear, EOM's intact, both eyes  Ears:    Normal external ear canals, both ears  Nose:   Nares normal, septum midline, mucosa normal, no drainage    or sinus tenderness  Throat:   Lips, mucosa, and tongue normal; teeth and gums normal  Neck:   Supple, symmetrical, trachea midline, no adenopathy;    thyroid:  no enlargement/tenderness/nodules  Back:     Symmetric, no curvature, ROM normal, no CVA tenderness  Lungs:     Clear to auscultation bilaterally, respirations unlabored  Chest Wall:    No tenderness or deformity   Heart:    Regular rate and rhythm, S1 and S2  normal, no murmur, rub   or gallop  Breast Exam:    No tenderness, masses, or nipple abnormality  Abdomen:     Soft, non-tender, bowel sounds active all four quadrants,    no masses, no organomegaly  Genitalia:    Normal female without lesion, discharge or tenderness     Extremities:   Extremities normal, atraumatic, no cyanosis or edema  Pulses:   2+ and symmetric all extremities  Skin:   Skin color, texture, turgor normal, no rashes or lesions     Assessment:    Healthy female exam.   NO GYN complaints. Requests STI screen   Plan:    Follow up in: 1 year.   f/u sooner prn F/u PAP with hrHPV and cx Labs: HIV, RPR, Hep B & C  Colin Ellers L. Harraway-Smith, M.D., Cherlynn June

## 2016-03-02 NOTE — Patient Instructions (Signed)
Name of Product Description Perfume- Free Paraben-Free Glycerin-Free PH-balanced Cost per month  Hyalo-GYN Gel in Tampon applicator containing Hydeal-D, a natural source of moisture. Http://www.hyalogyn.com Yes No Yes No $25  Replens Gel in tampon applicator, Clings to vaginal lining to keep it moist Yes Yes No Yes $15-32  K-Y Liquibeads Suppository that melts in the vagina Yes Yes No No $18-36  Neogyn Cream Cream to soothe vulvar dryness and pain, contains cutaneous lysate, a healing ingredient; not internal moisturizer but may help with irritation on the vulvar  LinkMoves.fr Yes Yes No No $39   Vaginal Moisturizers

## 2016-03-02 NOTE — Progress Notes (Signed)
Gyn

## 2016-03-03 LAB — CYTOLOGY - PAP
Adequacy: ABSENT
Diagnosis: NEGATIVE
HPV: NOT DETECTED

## 2016-03-03 LAB — URINE CULTURE

## 2016-03-03 LAB — RPR

## 2016-03-03 LAB — GC/CHLAMYDIA PROBE AMP (~~LOC~~) NOT AT ARMC
Chlamydia: NEGATIVE
NEISSERIA GONORRHEA: NEGATIVE

## 2016-03-12 ENCOUNTER — Ambulatory Visit (HOSPITAL_COMMUNITY): Payer: Medicare Other | Attending: Obstetrics & Gynecology

## 2016-03-20 ENCOUNTER — Ambulatory Visit (HOSPITAL_COMMUNITY)
Admission: RE | Admit: 2016-03-20 | Discharge: 2016-03-20 | Disposition: A | Discharge status: Home / Self Care | Payer: Medicare Other | Source: Ambulatory Visit | Attending: Obstetrics & Gynecology | Admitting: Obstetrics & Gynecology

## 2016-03-20 DIAGNOSIS — N852 Hypertrophy of uterus: Secondary | ICD-10-CM | POA: Diagnosis not present

## 2016-03-20 DIAGNOSIS — Z01419 Encounter for gynecological examination (general) (routine) without abnormal findings: Secondary | ICD-10-CM

## 2016-06-08 ENCOUNTER — Other Ambulatory Visit (HOSPITAL_COMMUNITY)
Admission: RE | Admit: 2016-06-08 | Discharge: 2016-06-08 | Disposition: A | Discharge status: Home / Self Care | Payer: Medicare Other | Source: Ambulatory Visit | Attending: Family Medicine | Admitting: Family Medicine

## 2016-06-08 ENCOUNTER — Ambulatory Visit: Payer: Medicare Other | Admitting: *Deleted

## 2016-06-08 DIAGNOSIS — N898 Other specified noninflammatory disorders of vagina: Secondary | ICD-10-CM

## 2016-06-08 MED ORDER — FLUCONAZOLE 150 MG PO TABS: 150.0000 mg | ORAL_TABLET | Freq: Once | ORAL | 0 refills | Status: AC

## 2016-06-09 LAB — CERVICOVAGINAL ANCILLARY ONLY
BACTERIAL VAGINITIS: NEGATIVE
CANDIDA VAGINITIS: POSITIVE — AB
TRICH (WINDOWPATH): NEGATIVE

## 2016-06-11 ENCOUNTER — Telehealth: Payer: Self-pay | Admitting: *Deleted

## 2016-06-11 MED ORDER — FLUCONAZOLE 150 MG PO TABS: 150.0000 mg | ORAL_TABLET | Freq: Once | ORAL | 0 refills | Status: AC

## 2016-06-11 NOTE — Telephone Encounter (Signed)
Message received from Dr. Gala Romney to treat pt with Diflucan per protocol. (wet prep results showed + yeast)  Rx e-prescribed. Message left on pt's voice mail stating that a prescription has been sent top her pharmacy. She may call back if she has questions.

## 2017-05-20 DIAGNOSIS — M75101 Unspecified rotator cuff tear or rupture of right shoulder, not specified as traumatic: Secondary | ICD-10-CM | POA: Insufficient documentation

## 2017-06-04 ENCOUNTER — Emergency Department (HOSPITAL_COMMUNITY): Payer: Medicare Other

## 2017-06-04 ENCOUNTER — Other Ambulatory Visit: Payer: Self-pay

## 2017-06-04 ENCOUNTER — Encounter (HOSPITAL_COMMUNITY): Payer: Self-pay

## 2017-06-04 ENCOUNTER — Emergency Department (HOSPITAL_COMMUNITY)
Admission: EM | Admit: 2017-06-04 | Discharge: 2017-06-05 | Disposition: A | Discharge status: Home / Self Care | Payer: Medicare Other | Attending: Emergency Medicine | Admitting: Emergency Medicine

## 2017-06-04 DIAGNOSIS — I10 Essential (primary) hypertension: Secondary | ICD-10-CM | POA: Diagnosis not present

## 2017-06-04 DIAGNOSIS — E119 Type 2 diabetes mellitus without complications: Secondary | ICD-10-CM | POA: Insufficient documentation

## 2017-06-04 DIAGNOSIS — Z7982 Long term (current) use of aspirin: Secondary | ICD-10-CM | POA: Diagnosis not present

## 2017-06-04 DIAGNOSIS — R1032 Left lower quadrant pain: Secondary | ICD-10-CM | POA: Diagnosis not present

## 2017-06-04 DIAGNOSIS — Z79899 Other long term (current) drug therapy: Secondary | ICD-10-CM | POA: Diagnosis not present

## 2017-06-04 LAB — CBC
HCT: 48.6 % — ABNORMAL HIGH (ref 36.0–46.0)
Hemoglobin: 16.1 g/dL — ABNORMAL HIGH (ref 12.0–15.0)
MCH: 28.1 pg (ref 26.0–34.0)
MCHC: 33.1 g/dL (ref 30.0–36.0)
MCV: 84.8 fL (ref 78.0–100.0)
Platelets: 335 10*3/uL (ref 150–400)
RBC: 5.73 MIL/uL — ABNORMAL HIGH (ref 3.87–5.11)
RDW: 13.7 % (ref 11.5–15.5)
WBC: 13.5 10*3/uL — ABNORMAL HIGH (ref 4.0–10.5)

## 2017-06-04 LAB — COMPREHENSIVE METABOLIC PANEL
ALBUMIN: 3.6 g/dL (ref 3.5–5.0)
ALT: 21 U/L (ref 14–54)
AST: 42 U/L — AB (ref 15–41)
Alkaline Phosphatase: 38 U/L (ref 38–126)
Anion gap: 12 (ref 5–15)
BILIRUBIN TOTAL: 2.4 mg/dL — AB (ref 0.3–1.2)
BUN: 18 mg/dL (ref 6–20)
CO2: 25 mmol/L (ref 22–32)
Calcium: 8.9 mg/dL (ref 8.9–10.3)
Chloride: 98 mmol/L — ABNORMAL LOW (ref 101–111)
Creatinine, Ser: 0.81 mg/dL (ref 0.44–1.00)
GFR calc Af Amer: >60 mL/min (ref >60)
GFR calc non Af Amer: >60 mL/min (ref >60)
GLUCOSE: 132 mg/dL — AB (ref 65–99)
POTASSIUM: 4.6 mmol/L (ref 3.5–5.1)
Sodium: 135 mmol/L (ref 135–145)
Total Protein: 5.7 g/dL — ABNORMAL LOW (ref 6.5–8.1)

## 2017-06-04 MED ORDER — ONDANSETRON HCL 4 MG/2ML IJ SOLN
4.0000 mg | Freq: Once | INTRAMUSCULAR | Status: AC
  Administered 2017-06-04: 4 mg via INTRAVENOUS
  Filled 2017-06-04: qty 2

## 2017-06-04 MED ORDER — HYDROMORPHONE HCL 1 MG/ML IJ SOLN
1.0000 mg | Freq: Once | INTRAMUSCULAR | Status: AC
  Administered 2017-06-04: 1 mg via INTRAVENOUS
  Filled 2017-06-04: qty 1

## 2017-06-04 MED ORDER — IOPAMIDOL (ISOVUE-300) INJECTION 61%
INTRAVENOUS | Status: AC
  Administered 2017-06-05: 100 mL
  Filled 2017-06-04: qty 100

## 2017-06-04 NOTE — ED Notes (Signed)
Pt remains in waiting room. Updated on wait for treatment room. 

## 2017-06-04 NOTE — ED Notes (Signed)
cHARGE rn AWARE
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
## 2017-06-04 NOTE — ED Notes (Signed)
ED Provider at bedside. 
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
## 2017-06-04 NOTE — ED Notes (Signed)
Provider at bedside
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
## 2017-06-04 NOTE — ED Notes (Addendum)
CT asking about pregnancy test. Pt listed as post menopausal in chart, CT notified

## 2017-06-05 ENCOUNTER — Emergency Department (HOSPITAL_COMMUNITY): Payer: Medicare Other

## 2017-06-05 DIAGNOSIS — R1032 Left lower quadrant pain: Secondary | ICD-10-CM | POA: Diagnosis not present

## 2017-06-05 MED ORDER — HYDROMORPHONE HCL 1 MG/ML IJ SOLN
1.0000 mg | Freq: Once | INTRAMUSCULAR | Status: AC
  Administered 2017-06-05: 1 mg via INTRAVENOUS
  Filled 2017-06-05: qty 1

## 2017-06-05 MED ORDER — CYCLOBENZAPRINE HCL 10 MG PO TABS: 10.0000 mg | ORAL_TABLET | Freq: Two times a day (BID) | ORAL | 0 refills | Status: DC | PRN

## 2017-06-05 NOTE — ED Notes (Signed)
Unable to obtain e-sign d/t equipment malfunction.  D/c instructions, medications, follow up and return precautions reviewed w/ pt.  Pt verbalized understanding.

## 2017-10-20 DIAGNOSIS — M25562 Pain in left knee: Secondary | ICD-10-CM | POA: Insufficient documentation

## 2017-11-10 ENCOUNTER — Ambulatory Visit: Payer: Self-pay | Admitting: Orthopedic Surgery

## 2017-11-14 ENCOUNTER — Emergency Department (HOSPITAL_COMMUNITY)
Admission: EM | Admit: 2017-11-14 | Discharge: 2017-11-14 | Disposition: A | Discharge status: Home / Self Care | Payer: Medicare Other | Attending: Emergency Medicine | Admitting: Emergency Medicine

## 2017-11-14 ENCOUNTER — Encounter (HOSPITAL_COMMUNITY): Payer: Self-pay | Admitting: Emergency Medicine

## 2017-11-14 DIAGNOSIS — E119 Type 2 diabetes mellitus without complications: Secondary | ICD-10-CM | POA: Diagnosis not present

## 2017-11-14 DIAGNOSIS — N6310 Unspecified lump in the right breast, unspecified quadrant: Secondary | ICD-10-CM | POA: Diagnosis not present

## 2017-11-14 DIAGNOSIS — Z7982 Long term (current) use of aspirin: Secondary | ICD-10-CM | POA: Insufficient documentation

## 2017-11-14 DIAGNOSIS — I10 Essential (primary) hypertension: Secondary | ICD-10-CM | POA: Diagnosis not present

## 2017-11-14 DIAGNOSIS — Z79899 Other long term (current) drug therapy: Secondary | ICD-10-CM | POA: Insufficient documentation

## 2017-11-14 NOTE — ED Provider Notes (Signed)
[facility] DEPT Provider Note   CSN: [id number] Arrival date & time: 11/14/17  1248     History   Chief Complaint Chief Complaint  Patient presents with  . breast knot    HPI Lisa Poole is a 52 y.o. female.  The history is provided by the patient. No language interpreter was used.     52 year old female with history of diabetes presenting for evaluation of breast nodule.  Patient noticed a painful lump to her right breast 4 days ago while she was showering.  She described the pain as a constant sharp sensation worse with palpation, nonradiating and rates it 6 out of 10.  She denies any associated fever, chills, shortness of breath, productive cough, nipple discharge, rash or any recent injury.  No prior history of cancer.  No abnormal weight changes, night sweats.  No history of tobacco abuse.  No specific treatment tried.  Past Medical History:  Diagnosis Date  . Aortic insufficiency   . Diabetes mellitus without complication ([place])   . Hypercholesteremia   . Hypertension     Patient Active Problem List   Diagnosis Date Noted  . Aortic insufficiency     Past Surgical History:  Procedure Laterality Date  . APPENDECTOMY    . SINUS EXPLORATION    . TUBAL LIGATION       OB History    Gravida  5   Para  4   Term  1   Preterm  3   AB  1   Living  4     SAB  1   TAB  0   Ectopic  0   Multiple  0   Live Births  4            Home Medications    Prior to Admission medications   Medication Sig Start Date End Date Taking? Authorizing Provider  aspirin EC 81 MG tablet Take 81 mg by mouth every evening.     [provider]  cyclobenzaprine (FLEXERIL) 10 MG tablet Take 1 tablet (10 mg total) by mouth 2 (two) times daily as needed for muscle spasms. 06/05/17   Montine Circle, PA-C  DULoxetine (CYMBALTA) 30 MG capsule Take 30 mg by mouth daily. 05/15/17   [provider]  HYDROcodone-acetaminophen  (NORCO) 10-325 MG tablet Take 1 tablet by mouth 4 (four) times daily. 05/15/17   [provider]  INVOKAMET XR 50-500 MG TB24 Take 1 tablet by mouth 2 (two) times daily. 12/12/15   [provider]  losartan-hydrochlorothiazide (HYZAAR) 100-12.5 MG tablet Take 1 tablet by mouth every morning. 12/23/15   [provider]  lovastatin (MEVACOR) 20 MG tablet Take 20 mg by mouth at bedtime.    [provider]  metoprolol (LOPRESSOR) 50 MG tablet Take 50 mg by mouth 2 (two) times daily.    [provider]    Family History No family history on file.  Social History Social History   Tobacco Use  . Smoking status: Never Smoker  . Smokeless tobacco: Never Used  Substance Use Topics  . Alcohol use: No  . Drug use: No     Allergies   Ace inhibitors; Benazepril; Lisinopril; Lotensin [benazepril hcl]; and Sulfa antibiotics   Review of Systems Review of Systems  Constitutional: Negative for fever.  Skin: Negative for rash.  Neurological: Negative for numbness.     Physical Exam Updated Vital Signs BP 139/81 (BP Location: Left Arm)   Pulse 77  Temp 98.5 F (36.9 C) (Oral)   Resp 17   LMP 07/25/2015 (Exact Date)   SpO2 98%   Physical Exam  Constitutional: She appears well-developed and well-nourished. No distress.  HENT:  Head: Atraumatic.  Eyes: Conjunctivae are normal.  Neck: Neck supple.  Cardiovascular: Normal rate and regular rhythm.  Pulmonary/Chest: Effort normal and breath sounds normal.    Neurological: She is alert.  Skin: No rash noted.  Psychiatric: She has a normal mood and affect.  Nursing note and vitals reviewed.    ED Treatments / Results  Labs (all labs ordered are listed, but only abnormal results are displayed) Labs Reviewed - No data to display  EKG None  Radiology No results found.  Procedures Procedures (including critical care time)  Medications Ordered in ED Medications - No data to  display   Initial Impression / Assessment and Plan / ED Course  I have reviewed the triage vital signs and the nursing notes.  Pertinent labs & imaging results that were available during my care of the patient were reviewed by me and considered in my medical decision making (see chart for details).     BP 139/81 (BP Location: Left Arm)   Pulse 77   Temp 98.5 F (36.9 C) (Oral)   Resp 17   LMP 07/25/2015 (Exact Date)   SpO2 98%    Final Clinical Impressions(s) / ED Diagnoses   Final diagnoses:  Mass of breast, right    ED Discharge Orders    None     1:10 PM Patient noticed a painful lump to her right breast.  There is a roughly 1 cm subcutaneous mobile nodule noted to the right breast.  No evidence of abscess.  Differential diagnosis include fibrocystic disease, cyst, fibroadenoma, fat necrosis, breast cancer.  Patient without any systemic complaint.  Will give referral to outpatient breast ultrasound for further evaluation.  Return precautions discussed.   Domenic Moras, PA-C 11/14/17 [place], MD 11/15/17 [phone]

## 2017-11-14 NOTE — Discharge Instructions (Addendum)
Please call and follow up at the [facility] tomorrow for an Ultrasound of your right breast for further evaluation of your symptom.

## 2017-11-14 NOTE — ED Triage Notes (Signed)
Pt reports that 4 days ago she noticed knot I right breast that is painful. Denies any drainage. Reports hasnt notified her PCP.

## 2017-11-19 ENCOUNTER — Other Ambulatory Visit: Payer: Self-pay | Admitting: Physician Assistant

## 2017-11-19 DIAGNOSIS — N63 Unspecified lump in unspecified breast: Secondary | ICD-10-CM

## 2017-11-24 NOTE — H&P (Signed)
TOTAL KNEE ADMISSION H&P  Patient is being admitted for left total knee arthroplasty.  Subjective:  Chief Complaint:left knee pain.  HPI: Lisa Poole, 52 y.o. female, has a history of pain and functional disability in the left knee due to arthritis and has failed non-surgical conservative treatments for greater than 12 weeks to includecorticosteriod injections, viscosupplementation injections, use of assistive devices and weight reduction as appropriate.  Onset of symptoms was gradual, starting 2 years ago with gradually worsening course since that time. The patient noted no past surgery on the left knee(s).  Patient currently rates pain in the left knee(s) at 7 out of 10 with activity. Patient has worsening of pain with activity and weight bearing, pain with passive range of motion and joint swelling.  Patient has evidence of subchondral sclerosis, periarticular osteophytes and joint space narrowing by imaging studies. There is no active infection.  Patient Active Problem List   Diagnosis Date Noted  . Aortic insufficiency    Past Medical History:  Diagnosis Date  . Aortic insufficiency   . Diabetes mellitus without complication ([place])   . Hypercholesteremia   . Hypertension     Past Surgical History:  Procedure Laterality Date  . APPENDECTOMY    . SINUS EXPLORATION    . TUBAL LIGATION      No current facility-administered medications for this encounter.    Current Outpatient Medications  Medication Sig Dispense Refill Last Dose  . aspirin EC 81 MG tablet Take 81 mg by mouth every evening.    06/04/2017 at Unknown time  . cyclobenzaprine (FLEXERIL) 10 MG tablet Take 1 tablet (10 mg total) by mouth 2 (two) times daily as needed for muscle spasms. 20 tablet 0   . DULoxetine (CYMBALTA) 30 MG capsule Take 30 mg by mouth daily.  1 06/04/2017 at Unknown time  . HYDROcodone-acetaminophen (NORCO) 10-325 MG tablet Take 1 tablet by mouth 4 (four) times daily.  0 06/04/2017 at Unknown time  .  INVOKAMET XR 50-500 MG TB24 Take 1 tablet by mouth 2 (two) times daily.   06/04/2017 at Unknown time  . losartan-hydrochlorothiazide (HYZAAR) 100-12.5 MG tablet Take 1 tablet by mouth every morning.   06/04/2017 at Unknown time  . lovastatin (MEVACOR) 20 MG tablet Take 20 mg by mouth at bedtime.   06/04/2017 at Unknown time  . metoprolol (LOPRESSOR) 50 MG tablet Take 50 mg by mouth 2 (two) times daily.   06/04/2017 at 0800   Allergies  Allergen Reactions  . Ace Inhibitors Other (See Comments)    Flu like symptoms  . Benazepril Other (See Comments)    Flu like symptoms  . Lisinopril Other (See Comments)    Flu like symptoms  . Lotensin [Benazepril Hcl] Other (See Comments)    Flu like symptoms  . Sulfa Antibiotics Nausea And Vomiting    Social History   Tobacco Use  . Smoking status: Never Smoker  . Smokeless tobacco: Never Used  Substance Use Topics  . Alcohol use: No    No family history on file.   Review of Systems  Constitutional: Negative.   HENT: Negative.   Eyes: Negative.   Respiratory: Negative.   Cardiovascular: Negative.   Gastrointestinal: Negative.   Genitourinary: Negative.   Musculoskeletal: Positive for joint pain.  Skin: Negative.   Neurological: Negative.   Endo/Heme/Allergies: Negative.   Psychiatric/Behavioral: Negative.     Objective:  Physical Exam  Constitutional: She is oriented to person, place, and time. She appears well-developed.  HENT:  Head: Normocephalic.  Eyes: EOM are normal.  Neck: Normal range of motion.  Cardiovascular: Normal rate and intact distal pulses.  Respiratory: Effort normal.  GI: Soft.  Genitourinary:  Genitourinary Comments: Deferred  Musculoskeletal:  Knee pain. Knee is stable. LLE grossly n/v intact.   Neurological: She is alert and oriented to person, place, and time.  Skin: Skin is warm and dry.  Psychiatric: Her behavior is normal.    Vital signs in last 24 hours: BP: ()/()  Arterial Line BP: ()/()    Labs:   Estimated body mass index is 41.4 kg/m as calculated from the following:   Height as of 06/04/17: 5' (1.524 m).   Weight as of 06/04/17: 96.2 kg (212 lb).   Imaging Review Plain radiographs demonstrate moderate degenerative joint disease of the left knee(s). The overall alignment ismild varus. The bone quality appears to be excellent for age and reported activity level.   Preoperative templating of the joint replacement has been completed, documented, and submitted to the Operating Room personnel in order to optimize intra-operative equipment management.   Anticipated LOS equal to or greater than 2 midnights due to - Age 22 and older with one or more of the following:  - Obesity  - Expected need for hospital services (PT, OT, Nursing) required for safe  discharge  - Anticipated need for postoperative skilled nursing care or inpatient rehab  - Active co-morbidities: None OR   - Unanticipated findings during/Post Surgery: None  - Patient is a high risk of re-admission due to: None     Assessment/Plan:  End stage arthritis, left knee   The patient history, physical examination, clinical judgment of the provider and imaging studies are consistent with end stage degenerative joint disease of the left knee(s) and total knee arthroplasty is deemed medically necessary. The treatment options including medical management, injection therapy arthroscopy and arthroplasty were discussed at length. The risks and benefits of total knee arthroplasty were presented and reviewed. The risks due to aseptic loosening, infection, stiffness, patella tracking problems, thromboembolic complications and other imponderables were discussed. The patient acknowledged the explanation, agreed to proceed with the plan and consent was signed. Patient is being admitted for inpatient treatment for surgery, pain control, PT, OT, prophylactic antibiotics, VTE prophylaxis, progressive ambulation and ADL's and  discharge planning. The patient is planning to be discharged home with home health services

## 2017-11-25 ENCOUNTER — Ambulatory Visit
Admission: RE | Admit: 2017-11-25 | Discharge: 2017-11-25 | Disposition: A | Discharge status: Home / Self Care | Payer: Medicare Other | Source: Ambulatory Visit | Attending: Physician Assistant | Admitting: Physician Assistant

## 2017-11-25 DIAGNOSIS — N63 Unspecified lump in unspecified breast: Secondary | ICD-10-CM

## 2017-12-03 NOTE — Progress Notes (Signed)
06-06-17 (Epic) EKG  04-28-17 ECHO on chart

## 2017-12-03 NOTE — Patient Instructions (Addendum)
Lisa Poole  12/03/2017   Your procedure is scheduled on: 12-10-17   Report to [facility] Main  Entrance    Report to Admitting at 10:45 AM    Call this number if you have problems the morning of surgery [phone]   Remember: Do not eat food or drink liquids :After Midnight. You may have a Clear Liquid Diet from Midnight until 7:15 AM. After 7:15 AM, nothing until after surgery.      CLEAR LIQUID DIET   Foods Allowed                                                                     Foods Excluded  Coffee and tea, regular and decaf                             liquids that you cannot  Plain Jell-O in any flavor                                             see through such as: Fruit ices (not with fruit pulp)                                     milk, soups, orange juice  Iced Popsicles                                    All solid food Carbonated beverages, regular and diet                                    Cranberry, grape and apple juices Sports drinks like Gatorade Lightly seasoned clear broth or consume(fat free) Sugar, honey syrup  Sample Menu Breakfast                                Lunch                                     Supper Cranberry juice                    Beef broth                            Chicken broth Jell-O                                     Grape juice  Apple juice Coffee or tea                        Jell-O                                      Popsicle                                                Coffee or tea                        Coffee or tea  _____________________________________________________________________    Take these medicines the morning of surgery with A SIP OF WATER: Gabapentin (Neurontin), and Metoprolol (Lopressor)                                You may not have any metal on your body including hair pins and              piercings  Do not wear jewelry, make-up, lotions, powders  or perfumes, deodorant             Do not wear nail polish.  Do not shave  48 hours prior to surgery.                 Do not bring valuables to the hospital. [place].  Contacts, dentures or bridgework may not be worn into surgery.  Leave suitcase in the car. After surgery it may be brought to your room.      Special Instructions: N/A              Please read over the following fact sheets you were given: _____________________________________________________________________  How to Manage Your Diabetes Before and After Surgery  Why is it important to control my blood sugar before and after surgery? . Improving blood sugar levels before and after surgery helps healing and can limit problems. . A way of improving blood sugar control is eating a healthy diet by: o  Eating less sugar and carbohydrates o  Increasing activity/exercise o  Talking with your doctor about reaching your blood sugar goals . High blood sugars (greater than 180 mg/dL) can raise your risk of infections and slow your recovery, so you will need to focus on controlling your diabetes during the weeks before surgery. . Make sure that the doctor who takes care of your diabetes knows about your planned surgery including the date and location.  How do I manage my blood sugar before surgery? . Check your blood sugar at least 4 times a day, starting 2 days before surgery, to make sure that the level is not too high or low. o Check your blood sugar the morning of your surgery when you wake up and every 2 hours until you get to the Short Stay unit. . If your blood sugar is less than 70 mg/dL, you will need to treat for low blood sugar: o Do not take insulin. o Treat a low blood sugar (less than 70 mg/dL) with  cup of clear juice (cranberry  or apple), 4 glucose tablets, OR glucose gel. o Recheck blood sugar in 15 minutes after treatment (to make sure it is greater than 70  mg/dL). If your blood sugar is not greater than 70 mg/dL on recheck, call [phone] for further instructions. . Report your blood sugar to the short stay nurse when you get to Short Stay.  . If you are admitted to the hospital after surgery: o Your blood sugar will be checked by the staff and you will probably be given insulin after surgery (instead of oral diabetes medicines) to make sure you have good blood sugar levels. o The goal for blood sugar control after surgery is 80-180 mg/dL.   WHAT DO I DO ABOUT MY DIABETES MEDICATION?  Marland Kitchen Do not take oral diabetes medicines (pills) the morning of surgery.  . THE DAY BEFORE SURGERY, take you usual dose of Iv[place]amet XR                    [place] - Preparing for Surgery Before surgery, you can play an important role.  Because skin is not sterile, your skin needs to be as free of germs as possible.  You can reduce the number of germs on your skin by washing with CHG (chlorahexidine gluconate) soap before surgery.  CHG is an antiseptic cleaner which kills germs and bonds with the skin to continue killing germs even after washing. Please DO NOT use if you have an allergy to CHG or antibacterial soaps.  If your skin becomes reddened/irritated stop using the CHG and inform your nurse when you arrive at Short Stay. Do not shave (including legs and underarms) for at least 48 hours prior to the first CHG shower.  You may shave your face/neck. Please follow these instructions carefully:  1.  Shower with CHG Soap the night before surgery and the  morning of Surgery.  2.  If you choose to wash your hair, wash your hair first as usual with your  normal  shampoo.  3.  After you shampoo, rinse your hair and body thoroughly to remove the  shampoo.                           4.  Use CHG as you would any other liquid soap.  You can apply chg directly  to the skin and wash                       Gently with a scrungie or clean washcloth.  5.  Apply the CHG  Soap to your body ONLY FROM THE NECK DOWN.   Do not use on face/ open                           Wound or open sores. Avoid contact with eyes, ears mouth and genitals (private parts).                       Wash face,  Genitals (private parts) with your normal soap.             6.  Wash thoroughly, paying special attention to the area where your surgery  will be performed.  7.  Thoroughly rinse your body with warm water from the neck down.  8.  DO NOT shower/wash with your normal soap after using and rinsing off  the CHG Soap.  9.  Pat yourself dry with a clean towel.            10.  Wear clean pajamas.            11.  Place clean sheets on your bed the night of your first shower and do not  sleep with pets. Day of Surgery : Do not apply any lotions/deodorants the morning of surgery.  Please wear clean clothes to the hospital/surgery center.  FAILURE TO FOLLOW THESE INSTRUCTIONS MAY RESULT IN THE CANCELLATION OF YOUR SURGERY PATIENT SIGNATURE_________________________________  NURSE SIGNATURE__________________________________  ________________________________________________________________________   Adam Phenix  An incentive spirometer is a tool that can help keep your lungs clear and active. This tool measures how well you are filling your lungs with each breath. Taking long deep breaths may help reverse or decrease the chance of developing breathing (pulmonary) problems (especially infection) following:  A long period of time when you are unable to move or be active. BEFORE THE PROCEDURE   If the spirometer includes an indicator to show your best effort, your nurse or respiratory therapist will set it to a desired goal.  If possible, sit up straight or lean slightly forward. Try not to slouch.  Hold the incentive spirometer in an upright position. INSTRUCTIONS FOR USE  1. Sit on the edge of your bed if possible, or sit up as far as you can in bed or on a  chair. 2. Hold the incentive spirometer in an upright position. 3. Breathe out normally. 4. Place the mouthpiece in your mouth and seal your lips tightly around it. 5. Breathe in slowly and as deeply as possible, raising the piston or the ball toward the top of the column. 6. Hold your breath for 3-5 seconds or for as long as possible. Allow the piston or ball to fall to the bottom of the column. 7. Remove the mouthpiece from your mouth and breathe out normally. 8. Rest for a few seconds and repeat Steps 1 through 7 at least 10 times every 1-2 hours when you are awake. Take your time and take a few normal breaths between deep breaths. 9. The spirometer may include an indicator to show your best effort. Use the indicator as a goal to work toward during each repetition. 10. After each set of 10 deep breaths, practice coughing to be sure your lungs are clear. If you have an incision (the cut made at the time of surgery), support your incision when coughing by placing a pillow or rolled up towels firmly against it. Once you are able to get out of bed, walk around indoors and cough well. You may stop using the incentive spirometer when instructed by your caregiver.  RISKS AND COMPLICATIONS  Take your time so you do not get dizzy or light-headed.  If you are in pain, you may need to take or ask for pain medication before doing incentive spirometry. It is harder to take a deep breath if you are having pain. AFTER USE  Rest and breathe slowly and easily.  It can be helpful to keep track of a log of your progress. Your caregiver can provide you with a simple table to help with this. If you are using the spirometer at home, follow these instructions: [place] IF:   You are having difficultly using the spirometer.  You have trouble using the spirometer as often as instructed.  Your pain medication is not giving enough relief while using the spirometer.  You develop  fever of 100.5 F  (38.1 C) or higher. SEEK IMMEDIATE MEDICAL CARE IF:   You cough up bloody sputum that had not been present before.  You develop fever of 102 F (38.9 C) or greater.  You develop worsening pain at or near the incision site. MAKE SURE YOU:   Understand these instructions.  Will watch your condition.  Will get help right away if you are not doing well or get worse. Document Released: 09/07/2006 Document Revised: 07/20/2011 Document Reviewed: 11/08/2006 Exit[employer] Patient Information 2014 Exit[employer], [place].   ________________________________________________________________________  WHAT IS A BLOOD TRANSFUSION? Blood Transfusion Information  A transfusion is the replacement of blood or some of its parts. Blood is made up of multiple cells which provide different functions.  Red blood cells carry oxygen and are used for blood loss replacement.  White blood cells fight against infection.  Platelets control bleeding.  Plasma helps clot blood.  Other blood products are available for specialized needs, such as hemophilia or other clotting disorders. BEFORE THE TRANSFUSION  Who gives blood for transfusions?   Healthy volunteers who are fully evaluated to make sure their blood is safe. This is blood bank blood. Transfusion therapy is the safest it has ever been in the practice of medicine. Before blood is taken from a donor, a complete history is taken to make sure that person has no history of diseases nor engages in risky social behavior (examples are intravenous drug use or sexual activity with multiple partners). The donor's travel history is screened to minimize risk of transmitting infections, such as malaria. The donated blood is tested for signs of infectious diseases, such as HIV and hepatitis. The blood is then tested to be sure it is compatible with you in order to minimize the chance of a transfusion reaction. If you or a relative donates blood, this is often done in anticipation  of surgery and is not appropriate for emergency situations. It takes many days to process the donated blood. RISKS AND COMPLICATIONS Although transfusion therapy is very safe and saves many lives, the main dangers of transfusion include:   Getting an infectious disease.  Developing a transfusion reaction. This is an allergic reaction to something in the blood you were given. Every precaution is taken to prevent this. The decision to have a blood transfusion has been considered carefully by your caregiver before blood is given. Blood is not given unless the benefits outweigh the risks. AFTER THE TRANSFUSION  Right after receiving a blood transfusion, you will usually feel much better and more energetic. This is especially true if your red blood cells have gotten low (anemic). The transfusion raises the level of the red blood cells which carry oxygen, and this usually causes an energy increase.  The nurse administering the transfusion will monitor you carefully for complications. HOME CARE INSTRUCTIONS  No special instructions are needed after a transfusion. You may find your energy is better. Speak with your caregiver about any limitations on activity for underlying diseases you may have. SEEK MEDICAL CARE IF:   Your condition is not improving after your transfusion.  You develop redness or irritation at the intravenous (IV) site. SEEK IMMEDIATE MEDICAL CARE IF:  Any of the following symptoms occur over the next 12 hours:  Shaking chills.  You have a temperature by mouth above 102 F (38.9 C), not controlled by medicine.  Chest, back, or muscle pain.  People around you feel you are not acting correctly or are confused.  Shortness of breath  or difficulty breathing.  Dizziness and fainting.  You get a rash or develop hives.  You have a decrease in urine output.  Your urine turns a dark color or changes to pink, red, or brown. Any of the following symptoms occur over the next 10  days:  You have a temperature by mouth above 102 F (38.9 C), not controlled by medicine.  Shortness of breath.  Weakness after normal activity.  The white part of the eye turns yellow (jaundice).  You have a decrease in the amount of urine or are urinating less often.  Your urine turns a dark color or changes to pink, red, or brown. Document Released: 04/24/2000 Document Revised: 07/20/2011 Document Reviewed: 12/12/2007 [facility] Patient Information 2014 [place], [place].  _______________________________________________________________________

## 2017-12-07 ENCOUNTER — Encounter (HOSPITAL_COMMUNITY): Payer: Self-pay

## 2017-12-07 ENCOUNTER — Other Ambulatory Visit: Payer: Self-pay

## 2017-12-07 ENCOUNTER — Encounter (HOSPITAL_COMMUNITY)
Admission: RE | Admit: 2017-12-07 | Discharge: 2017-12-07 | Disposition: A | Discharge status: Home / Self Care | Payer: Medicare Other | Source: Ambulatory Visit | Attending: Specialist | Admitting: Specialist

## 2017-12-07 DIAGNOSIS — Z01812 Encounter for preprocedural laboratory examination: Secondary | ICD-10-CM | POA: Insufficient documentation

## 2017-12-07 DIAGNOSIS — M1712 Unilateral primary osteoarthritis, left knee: Secondary | ICD-10-CM | POA: Insufficient documentation

## 2017-12-07 LAB — CBC
HEMATOCRIT: 42.9 % (ref 36.0–46.0)
Hemoglobin: 13.6 g/dL (ref 12.0–15.0)
MCH: 27.6 pg (ref 26.0–34.0)
MCHC: 31.7 g/dL (ref 30.0–36.0)
MCV: 87.2 fL (ref 78.0–100.0)
PLATELETS: 372 10*3/uL (ref 150–400)
RBC: 4.92 MIL/uL (ref 3.87–5.11)
RDW: 13.5 % (ref 11.5–15.5)
WBC: 6.4 10*3/uL (ref 4.0–10.5)

## 2017-12-07 LAB — SURGICAL PCR SCREEN
MRSA, PCR: NEGATIVE
Staphylococcus aureus: NEGATIVE

## 2017-12-07 LAB — GLUCOSE, CAPILLARY: GLUCOSE-CAPILLARY: 120 mg/dL — AB (ref 70–99)

## 2017-12-07 LAB — BASIC METABOLIC PANEL
Anion gap: 8 (ref 5–15)
BUN: 20 mg/dL (ref 6–20)
CO2: 32 mmol/L (ref 22–32)
CREATININE: 0.87 mg/dL (ref 0.44–1.00)
Calcium: 9.8 mg/dL (ref 8.9–10.3)
Chloride: 104 mmol/L (ref 98–111)
GFR calc Af Amer: >60 mL/min (ref >60)
GLUCOSE: 130 mg/dL — AB (ref 70–99)
POTASSIUM: 4.2 mmol/L (ref 3.5–5.1)
Sodium: 144 mmol/L (ref 135–145)

## 2017-12-07 LAB — URINALYSIS, ROUTINE W REFLEX MICROSCOPIC
BILIRUBIN URINE: NEGATIVE
Glucose, UA: >=500 mg/dL — AB
Hgb urine dipstick: NEGATIVE
KETONES UR: NEGATIVE mg/dL
Nitrite: NEGATIVE
PH: 5 (ref 5.0–8.0)
Protein, ur: NEGATIVE mg/dL
Specific Gravity, Urine: 1.024 (ref 1.005–1.030)

## 2017-12-07 LAB — HEMOGLOBIN A1C
Hgb A1c MFr Bld: 6.4 % — ABNORMAL HIGH (ref 4.8–5.6)
Mean Plasma Glucose: 136.98 mg/dL

## 2017-12-07 LAB — APTT: APTT: 27 s (ref 24–36)

## 2017-12-07 LAB — ABO/RH: ABO/RH(D): AB POS

## 2017-12-07 LAB — PROTIME-INR
INR: 1.02
Prothrombin Time: 13.3 seconds (ref 11.4–15.2)

## 2017-12-07 NOTE — Progress Notes (Signed)
12-07-17 UA result routed to Dr. Theda Sers for review.

## 2017-12-10 ENCOUNTER — Inpatient Hospital Stay: Admit: 2017-12-10 | Payer: Medicare Other | Admitting: Specialist

## 2017-12-10 ENCOUNTER — Encounter (HOSPITAL_COMMUNITY)
Admission: RE | Disposition: A | Discharge status: Home / Self Care | Payer: Self-pay | Source: Ambulatory Visit | Attending: Specialist

## 2017-12-10 ENCOUNTER — Inpatient Hospital Stay (HOSPITAL_COMMUNITY): Payer: Medicare Other | Admitting: Anesthesiology

## 2017-12-10 ENCOUNTER — Other Ambulatory Visit: Payer: Self-pay

## 2017-12-10 ENCOUNTER — Encounter (HOSPITAL_COMMUNITY): Payer: Self-pay | Admitting: Anesthesiology

## 2017-12-10 ENCOUNTER — Inpatient Hospital Stay (HOSPITAL_COMMUNITY)
Admission: RE | Admit: 2017-12-10 | Discharge: 2017-12-13 | DRG: 470 | Disposition: A | Discharge status: Home / Self Care | Payer: Medicare Other | Source: Ambulatory Visit | Attending: Specialist | Admitting: Specialist

## 2017-12-10 DIAGNOSIS — E7800 Pure hypercholesterolemia, unspecified: Secondary | ICD-10-CM | POA: Diagnosis present

## 2017-12-10 DIAGNOSIS — M1712 Unilateral primary osteoarthritis, left knee: Secondary | ICD-10-CM

## 2017-12-10 DIAGNOSIS — Z882 Allergy status to sulfonamides status: Secondary | ICD-10-CM | POA: Diagnosis not present

## 2017-12-10 DIAGNOSIS — I351 Nonrheumatic aortic (valve) insufficiency: Secondary | ICD-10-CM | POA: Diagnosis present

## 2017-12-10 DIAGNOSIS — Z7982 Long term (current) use of aspirin: Secondary | ICD-10-CM | POA: Diagnosis not present

## 2017-12-10 DIAGNOSIS — Z9851 Tubal ligation status: Secondary | ICD-10-CM | POA: Diagnosis not present

## 2017-12-10 DIAGNOSIS — Z888 Allergy status to other drugs, medicaments and biological substances status: Secondary | ICD-10-CM

## 2017-12-10 DIAGNOSIS — E119 Type 2 diabetes mellitus without complications: Secondary | ICD-10-CM | POA: Diagnosis present

## 2017-12-10 DIAGNOSIS — I10 Essential (primary) hypertension: Secondary | ICD-10-CM | POA: Diagnosis present

## 2017-12-10 DIAGNOSIS — Z96659 Presence of unspecified artificial knee joint: Secondary | ICD-10-CM

## 2017-12-10 HISTORY — PX: TOTAL KNEE ARTHROPLASTY: SHX125

## 2017-12-10 HISTORY — DX: Unilateral primary osteoarthritis, left knee: M17.12

## 2017-12-10 LAB — TYPE AND SCREEN
ABO/RH(D): AB POS
Antibody Screen: NEGATIVE

## 2017-12-10 LAB — GLUCOSE, CAPILLARY
GLUCOSE-CAPILLARY: 101 mg/dL — AB (ref 70–99)
Glucose-Capillary: 133 mg/dL — ABNORMAL HIGH (ref 70–99)
Glucose-Capillary: 221 mg/dL — ABNORMAL HIGH (ref 70–99)

## 2017-12-10 SURGERY — ARTHROPLASTY, KNEE, TOTAL
Anesthesia: Spinal | Site: Knee | Laterality: Left

## 2017-12-10 MED ORDER — PROPOFOL 10 MG/ML IV BOLUS
INTRAVENOUS | Status: AC
  Filled 2017-12-10: qty 20

## 2017-12-10 MED ORDER — LOSARTAN POTASSIUM 50 MG PO TABS
100.0000 mg | ORAL_TABLET | Freq: Every day | ORAL | Status: DC
  Administered 2017-12-10 – 2017-12-13 (×4): 100 mg via ORAL
  Filled 2017-12-10 (×4): qty 2

## 2017-12-10 MED ORDER — HYDROMORPHONE HCL 1 MG/ML IJ SOLN: 0.2500 mg | INTRAMUSCULAR | Status: DC | PRN

## 2017-12-10 MED ORDER — ASPIRIN EC 325 MG PO TBEC: 325.0000 mg | DELAYED_RELEASE_TABLET | Freq: Two times a day (BID) | ORAL | 0 refills | Status: AC

## 2017-12-10 MED ORDER — LOSARTAN POTASSIUM-HCTZ 100-12.5 MG PO TABS: 1.0000 | ORAL_TABLET | ORAL | Status: DC

## 2017-12-10 MED ORDER — TRANEXAMIC ACID 1000 MG/10ML IV SOLN
1000.0000 mg | INTRAVENOUS | Status: AC
  Administered 2017-12-10: 1000 mg via INTRAVENOUS

## 2017-12-10 MED ORDER — ONDANSETRON HCL 4 MG/2ML IJ SOLN
INTRAMUSCULAR | Status: DC | PRN
  Administered 2017-12-10: 4 mg via INTRAVENOUS

## 2017-12-10 MED ORDER — ESMOLOL HCL 100 MG/10ML IV SOLN
INTRAVENOUS | Status: AC
  Filled 2017-12-10: qty 10

## 2017-12-10 MED ORDER — SODIUM CHLORIDE 0.9 % IR SOLN
Status: DC | PRN
  Administered 2017-12-10: 2000 mL

## 2017-12-10 MED ORDER — METOCLOPRAMIDE HCL 5 MG/ML IJ SOLN: 10.0000 mg | Freq: Once | INTRAMUSCULAR | Status: DC | PRN

## 2017-12-10 MED ORDER — ENOXAPARIN SODIUM 30 MG/0.3ML ~~LOC~~ SOLN
30.0000 mg | Freq: Two times a day (BID) | SUBCUTANEOUS | Status: DC
  Administered 2017-12-11 – 2017-12-13 (×5): 30 mg via SUBCUTANEOUS
  Filled 2017-12-10 (×5): qty 0.3

## 2017-12-10 MED ORDER — DEXAMETHASONE SODIUM PHOSPHATE 10 MG/ML IJ SOLN
10.0000 mg | Freq: Once | INTRAMUSCULAR | Status: AC
  Administered 2017-12-11: 10 mg via INTRAVENOUS
  Filled 2017-12-10: qty 1

## 2017-12-10 MED ORDER — CHLORHEXIDINE GLUCONATE 4 % EX LIQD: 60.0000 mL | Freq: Once | CUTANEOUS | Status: DC

## 2017-12-10 MED ORDER — METHOCARBAMOL 500 MG PO TABS
500.0000 mg | ORAL_TABLET | Freq: Four times a day (QID) | ORAL | Status: DC | PRN
  Administered 2017-12-10 – 2017-12-13 (×8): 500 mg via ORAL
  Filled 2017-12-10 (×8): qty 1

## 2017-12-10 MED ORDER — PHENOL 1.4 % MT LIQD
1.0000 | OROMUCOSAL | Status: DC | PRN
  Filled 2017-12-10: qty 177

## 2017-12-10 MED ORDER — DOCUSATE SODIUM 100 MG PO CAPS
100.0000 mg | ORAL_CAPSULE | Freq: Two times a day (BID) | ORAL | Status: DC
  Administered 2017-12-10 – 2017-12-13 (×4): 100 mg via ORAL
  Filled 2017-12-10 (×5): qty 1

## 2017-12-10 MED ORDER — ONDANSETRON HCL 4 MG/2ML IJ SOLN: 4.0000 mg | Freq: Four times a day (QID) | INTRAMUSCULAR | Status: DC | PRN

## 2017-12-10 MED ORDER — PROPOFOL 10 MG/ML IV BOLUS
INTRAVENOUS | Status: AC
  Filled 2017-12-10: qty 60

## 2017-12-10 MED ORDER — HYDROCHLOROTHIAZIDE 12.5 MG PO CAPS
12.5000 mg | ORAL_CAPSULE | Freq: Every day | ORAL | Status: DC
  Administered 2017-12-10 – 2017-12-13 (×4): 12.5 mg via ORAL
  Filled 2017-12-10 (×4): qty 1

## 2017-12-10 MED ORDER — MAGNESIUM CITRATE PO SOLN: 1.0000 | Freq: Once | ORAL | Status: DC | PRN

## 2017-12-10 MED ORDER — ACETAMINOPHEN 325 MG PO TABS
325.0000 mg | ORAL_TABLET | Freq: Four times a day (QID) | ORAL | Status: DC | PRN
  Administered 2017-12-13: 650 mg via ORAL
  Filled 2017-12-10: qty 2

## 2017-12-10 MED ORDER — ROPIVACAINE HCL 7.5 MG/ML IJ SOLN
INTRAMUSCULAR | Status: DC | PRN
  Administered 2017-12-10: 20 mL via PERINEURAL

## 2017-12-10 MED ORDER — DEXTROSE 5 % IV SOLN
500.0000 mg | Freq: Four times a day (QID) | INTRAVENOUS | Status: DC | PRN
  Filled 2017-12-10: qty 5

## 2017-12-10 MED ORDER — OXYCODONE HCL 5 MG PO TABS
10.0000 mg | ORAL_TABLET | ORAL | Status: DC | PRN
  Administered 2017-12-11 (×3): 10 mg via ORAL
  Administered 2017-12-12: 15 mg via ORAL
  Administered 2017-12-12: 10 mg via ORAL
  Administered 2017-12-12 – 2017-12-13 (×4): 15 mg via ORAL
  Filled 2017-12-10: qty 2
  Filled 2017-12-10 (×3): qty 3
  Filled 2017-12-10: qty 2
  Filled 2017-12-10 (×2): qty 3

## 2017-12-10 MED ORDER — ONDANSETRON HCL 4 MG PO TABS: 4.0000 mg | ORAL_TABLET | Freq: Four times a day (QID) | ORAL | Status: DC | PRN

## 2017-12-10 MED ORDER — OXYCODONE HCL 5 MG PO TABS
5.0000 mg | ORAL_TABLET | ORAL | Status: DC | PRN
  Administered 2017-12-10: 5 mg via ORAL
  Administered 2017-12-11: 10 mg via ORAL
  Filled 2017-12-10: qty 1
  Filled 2017-12-10 (×3): qty 2

## 2017-12-10 MED ORDER — STERILE WATER FOR IRRIGATION IR SOLN
Status: DC | PRN
  Administered 2017-12-10: 2000 mL

## 2017-12-10 MED ORDER — BUPIVACAINE IN DEXTROSE 0.75-8.25 % IT SOLN
INTRATHECAL | Status: DC | PRN
  Administered 2017-12-10: 1.6 mL via INTRATHECAL

## 2017-12-10 MED ORDER — DIPHENHYDRAMINE HCL 12.5 MG/5ML PO ELIX: 12.5000 mg | ORAL_SOLUTION | ORAL | Status: DC | PRN

## 2017-12-10 MED ORDER — HYDROMORPHONE HCL 1 MG/ML IJ SOLN
0.5000 mg | INTRAMUSCULAR | Status: DC | PRN
  Administered 2017-12-10 – 2017-12-11 (×4): 1 mg via INTRAVENOUS
  Filled 2017-12-10 (×4): qty 1

## 2017-12-10 MED ORDER — FENTANYL CITRATE (PF) 100 MCG/2ML IJ SOLN
50.0000 ug | INTRAMUSCULAR | Status: DC | PRN
  Administered 2017-12-10: 100 ug via INTRAVENOUS
  Filled 2017-12-10: qty 2

## 2017-12-10 MED ORDER — DEXAMETHASONE SODIUM PHOSPHATE 10 MG/ML IJ SOLN
INTRAMUSCULAR | Status: DC | PRN
  Administered 2017-12-10: 10 mg via INTRAVENOUS

## 2017-12-10 MED ORDER — METFORMIN HCL ER 500 MG PO TB24
500.0000 mg | ORAL_TABLET | Freq: Two times a day (BID) | ORAL | Status: DC
  Administered 2017-12-11: 500 mg via ORAL
  Filled 2017-12-10: qty 1

## 2017-12-10 MED ORDER — SODIUM CHLORIDE 0.9 % IV SOLN
INTRAVENOUS | Status: DC | PRN
  Administered 2017-12-10: 25 ug/min via INTRAVENOUS

## 2017-12-10 MED ORDER — ALUM & MAG HYDROXIDE-SIMETH 200-200-20 MG/5ML PO SUSP
30.0000 mL | ORAL | Status: DC | PRN
Start: 2017-12-10 — End: 2017-12-13

## 2017-12-10 MED ORDER — PRAVASTATIN SODIUM 20 MG PO TABS
10.0000 mg | ORAL_TABLET | Freq: Every day | ORAL | Status: DC
  Administered 2017-12-11 – 2017-12-12 (×2): 10 mg via ORAL
  Filled 2017-12-10 (×4): qty 1

## 2017-12-10 MED ORDER — KETOROLAC TROMETHAMINE 30 MG/ML IJ SOLN
INTRAMUSCULAR | Status: AC
  Filled 2017-12-10: qty 1

## 2017-12-10 MED ORDER — BUPIVACAINE-EPINEPHRINE 0.25% -1:200000 IJ SOLN
INTRAMUSCULAR | Status: DC | PRN
  Administered 2017-12-10: 30 mL

## 2017-12-10 MED ORDER — CANAGLIFLOZIN 100 MG PO TABS
50.0000 mg | ORAL_TABLET | Freq: Two times a day (BID) | ORAL | Status: DC
  Administered 2017-12-11: 100 mg via ORAL
  Filled 2017-12-10 (×2): qty 1

## 2017-12-10 MED ORDER — SODIUM CHLORIDE 0.9 % IJ SOLN
INTRAMUSCULAR | Status: DC | PRN
  Administered 2017-12-10: 29 mL

## 2017-12-10 MED ORDER — TRAZODONE HCL 100 MG PO TABS
100.0000 mg | ORAL_TABLET | Freq: Every day | ORAL | Status: DC
  Administered 2017-12-10 – 2017-12-12 (×3): 100 mg via ORAL
  Filled 2017-12-10 (×3): qty 1

## 2017-12-10 MED ORDER — ACETAMINOPHEN 500 MG PO TABS
1000.0000 mg | ORAL_TABLET | Freq: Four times a day (QID) | ORAL | Status: AC
  Administered 2017-12-10 – 2017-12-11 (×4): 1000 mg via ORAL
  Filled 2017-12-10 (×4): qty 2

## 2017-12-10 MED ORDER — METOCLOPRAMIDE HCL 5 MG PO TABS: 5.0000 mg | ORAL_TABLET | Freq: Three times a day (TID) | ORAL | Status: DC | PRN

## 2017-12-10 MED ORDER — PROPOFOL 10 MG/ML IV BOLUS
INTRAVENOUS | Status: AC
  Filled 2017-12-10: qty 40

## 2017-12-10 MED ORDER — BISACODYL 5 MG PO TBEC: 5.0000 mg | DELAYED_RELEASE_TABLET | Freq: Every day | ORAL | Status: DC | PRN

## 2017-12-10 MED ORDER — PROPOFOL 10 MG/ML IV BOLUS
INTRAVENOUS | Status: DC | PRN
  Administered 2017-12-10 (×2): 10 mg via INTRAVENOUS
  Administered 2017-12-10 (×2): 20 mg via INTRAVENOUS

## 2017-12-10 MED ORDER — CEFAZOLIN SODIUM-DEXTROSE 2-4 GM/100ML-% IV SOLN
2.0000 g | Freq: Four times a day (QID) | INTRAVENOUS | Status: AC
  Administered 2017-12-10 – 2017-12-11 (×2): 2 g via INTRAVENOUS
  Filled 2017-12-10 (×2): qty 100

## 2017-12-10 MED ORDER — MEPERIDINE HCL 50 MG/ML IJ SOLN: 6.2500 mg | INTRAMUSCULAR | Status: DC | PRN

## 2017-12-10 MED ORDER — MENTHOL 3 MG MT LOZG: 1.0000 | LOZENGE | OROMUCOSAL | Status: DC | PRN

## 2017-12-10 MED ORDER — KETOROLAC TROMETHAMINE 30 MG/ML IJ SOLN
INTRAMUSCULAR | Status: DC | PRN
  Administered 2017-12-10: 30 mg

## 2017-12-10 MED ORDER — POLYETHYLENE GLYCOL 3350 17 G PO PACK: 17.0000 g | PACK | Freq: Every day | ORAL | Status: DC | PRN

## 2017-12-10 MED ORDER — MIDAZOLAM HCL 2 MG/2ML IJ SOLN
1.0000 mg | INTRAMUSCULAR | Status: DC | PRN
  Administered 2017-12-10: 2 mg via INTRAVENOUS
  Filled 2017-12-10: qty 2

## 2017-12-10 MED ORDER — METOCLOPRAMIDE HCL 5 MG/ML IJ SOLN: 5.0000 mg | Freq: Three times a day (TID) | INTRAMUSCULAR | Status: DC | PRN

## 2017-12-10 MED ORDER — BUPIVACAINE-EPINEPHRINE (PF) 0.25% -1:200000 IJ SOLN
INTRAMUSCULAR | Status: AC
  Filled 2017-12-10: qty 30

## 2017-12-10 MED ORDER — SUGAMMADEX SODIUM 500 MG/5ML IV SOLN
INTRAVENOUS | Status: AC
  Filled 2017-12-10: qty 5

## 2017-12-10 MED ORDER — SODIUM CHLORIDE 0.9 % IV SOLN
INTRAVENOUS | Status: DC
  Administered 2017-12-10: 19:00:00 via INTRAVENOUS

## 2017-12-10 MED ORDER — LACTATED RINGERS IV SOLN
INTRAVENOUS | Status: AC
  Administered 2017-12-10: 1000 mL via INTRAVENOUS

## 2017-12-10 MED ORDER — CEFAZOLIN SODIUM-DEXTROSE 2-4 GM/100ML-% IV SOLN
2.0000 g | INTRAVENOUS | Status: AC
  Administered 2017-12-10: 2 g via INTRAVENOUS
  Filled 2017-12-10: qty 100

## 2017-12-10 MED ORDER — GABAPENTIN 300 MG PO CAPS
300.0000 mg | ORAL_CAPSULE | Freq: Two times a day (BID) | ORAL | Status: DC
  Administered 2017-12-10 – 2017-12-13 (×6): 300 mg via ORAL
  Filled 2017-12-10 (×6): qty 1

## 2017-12-10 MED ORDER — OXYCODONE HCL 5 MG PO TABS: 5.0000 mg | ORAL_TABLET | ORAL | 0 refills | Status: DC | PRN

## 2017-12-10 MED ORDER — METOPROLOL TARTRATE 50 MG PO TABS
50.0000 mg | ORAL_TABLET | Freq: Two times a day (BID) | ORAL | Status: DC
  Administered 2017-12-10 – 2017-12-13 (×5): 50 mg via ORAL
  Filled 2017-12-10 (×6): qty 1

## 2017-12-10 MED ORDER — 0.9 % SODIUM CHLORIDE (POUR BTL) OPTIME
TOPICAL | Status: DC | PRN
  Administered 2017-12-10: 1000 mL

## 2017-12-10 MED ORDER — SODIUM CHLORIDE 0.9 % IJ SOLN
INTRAMUSCULAR | Status: AC
  Filled 2017-12-10: qty 50

## 2017-12-10 MED ORDER — FERROUS SULFATE 325 (65 FE) MG PO TABS
325.0000 mg | ORAL_TABLET | Freq: Three times a day (TID) | ORAL | Status: DC
  Administered 2017-12-10 – 2017-12-13 (×7): 325 mg via ORAL
  Filled 2017-12-10 (×7): qty 1

## 2017-12-10 MED ORDER — PROPOFOL 500 MG/50ML IV EMUL
INTRAVENOUS | Status: DC | PRN
  Administered 2017-12-10: 125 ug/kg/min via INTRAVENOUS

## 2017-12-10 MED ORDER — TRANEXAMIC ACID 1000 MG/10ML IV SOLN
INTRAVENOUS | Status: AC
  Filled 2017-12-10: qty 10

## 2017-12-10 MED ORDER — METHOCARBAMOL 500 MG PO TABS: 500.0000 mg | ORAL_TABLET | Freq: Three times a day (TID) | ORAL | 1 refills | Status: DC | PRN

## 2017-12-10 MED ORDER — CANAGLIFLOZIN-METFORMIN HCL ER 50-500 MG PO TB24: 1.0000 | ORAL_TABLET | Freq: Two times a day (BID) | ORAL | Status: DC

## 2017-12-10 SURGICAL SUPPLY — 61 items
BAG ZIPLOCK 12X15 (MISCELLANEOUS) ×6 IMPLANT
BANDAGE ACE 4X5 VEL STRL LF (GAUZE/BANDAGES/DRESSINGS) ×3 IMPLANT
BANDAGE ACE 6X5 VEL STRL LF (GAUZE/BANDAGES/DRESSINGS) ×3 IMPLANT
BLADE SAG 18X100X1.27 (BLADE) ×3 IMPLANT
BLADE SAW SGTL 13.0X1.19X90.0M (BLADE) ×3 IMPLANT
BOWL SMART MIX CTS (DISPOSABLE) ×3 IMPLANT
CEMENT HV SMART SET (Cement) ×6 IMPLANT
CEMENT TIBIA MBT SIZE 2.5 (Knees) ×1 IMPLANT
COVER SURGICAL LIGHT HANDLE (MISCELLANEOUS) ×3 IMPLANT
CUFF TOURN SGL QUICK 34 (TOURNIQUET CUFF) ×2
CUFF TRNQT CYL 34X4X40X1 (TOURNIQUET CUFF) ×1 IMPLANT
DECANTER SPIKE VIAL GLASS SM (MISCELLANEOUS) ×3 IMPLANT
DERMABOND ADVANCED (GAUZE/BANDAGES/DRESSINGS) ×2
DERMABOND ADVANCED .7 DNX12 (GAUZE/BANDAGES/DRESSINGS) ×1 IMPLANT
DRAPE U-SHAPE 47X51 STRL (DRAPES) ×3 IMPLANT
DRESSING AQUACEL AG SP 3.5X10 (GAUZE/BANDAGES/DRESSINGS) ×1 IMPLANT
DRSG AQUACEL AG SP 3.5X10 (GAUZE/BANDAGES/DRESSINGS) ×3
DRSG TEGADERM 4X4.75 (GAUZE/BANDAGES/DRESSINGS) ×3 IMPLANT
DURAPREP 26ML APPLICATOR (WOUND CARE) ×6 IMPLANT
ELECT REM PT RETURN 15FT ADLT (MISCELLANEOUS) ×3 IMPLANT
EVACUATOR 1/8 PVC DRAIN (DRAIN) ×3 IMPLANT
GAUZE SPONGE 2X2 8PLY STRL LF (GAUZE/BANDAGES/DRESSINGS) ×1 IMPLANT
GLOVE BIO SURGEON STRL SZ7.5 (GLOVE) ×6 IMPLANT
GLOVE BIOGEL PI IND STRL 7.5 (GLOVE) ×5 IMPLANT
GLOVE BIOGEL PI IND STRL 8 (GLOVE) ×2 IMPLANT
GLOVE BIOGEL PI INDICATOR 7.5 (GLOVE) ×10
GLOVE BIOGEL PI INDICATOR 8 (GLOVE) ×4
GLOVE ECLIPSE 8.0 STRL XLNG CF (GLOVE) ×6 IMPLANT
GLOVE SURG ORTHO 9.0 STRL STRW (GLOVE) ×3 IMPLANT
GLOVE SURG SS PI 7.0 STRL IVOR (GLOVE) ×3 IMPLANT
GOWN STRL REUS W/ TWL XL LVL3 (GOWN DISPOSABLE) ×2 IMPLANT
GOWN STRL REUS W/TWL XL LVL3 (GOWN DISPOSABLE) ×10 IMPLANT
HANDPIECE INTERPULSE COAX TIP (DISPOSABLE) ×2
HOLDER FOLEY CATH W/STRAP (MISCELLANEOUS) ×3 IMPLANT
IMMOBILIZER KNEE 20 (SOFTGOODS) ×3
IMMOBILIZER KNEE 20 THIGH 36 (SOFTGOODS) ×1 IMPLANT
IMPL FEMUR SIGMA LT PS SZ 3 (Knees) ×1 IMPLANT
IMPLANT FEMUR SIGMA LT PS SZ 3 (Knees) ×3 IMPLANT
INSERT TIBIAL PFC SIG SZ3 10MM (Knees) ×3 IMPLANT
PACK TOTAL KNEE CUSTOM (KITS) ×3 IMPLANT
PATELLA DOME PFC 32MM (Knees) ×3 IMPLANT
POSITIONER SURGICAL ARM (MISCELLANEOUS) ×3 IMPLANT
SET HNDPC FAN SPRY TIP SCT (DISPOSABLE) ×1 IMPLANT
SET PAD KNEE POSITIONER (MISCELLANEOUS) ×3 IMPLANT
SPONGE GAUZE 2X2 STER 10/PKG (GAUZE/BANDAGES/DRESSINGS) ×2
SPONGE LAP 18X18 RF (DISPOSABLE) IMPLANT
SPONGE SURGIFOAM ABS GEL 100 (HEMOSTASIS) ×3 IMPLANT
STOCKINETTE 6  STRL (DRAPES) ×2
STOCKINETTE 6 STRL (DRAPES) ×1 IMPLANT
SUT MNCRL AB 3-0 PS2 18 (SUTURE) ×3 IMPLANT
SUT VIC AB 1 CT1 27 (SUTURE) ×8
SUT VIC AB 1 CT1 27XBRD ANTBC (SUTURE) ×4 IMPLANT
SUT VIC AB 2-0 CT1 27 (SUTURE) ×4
SUT VIC AB 2-0 CT1 TAPERPNT 27 (SUTURE) ×2 IMPLANT
SUT VLOC 180 0 24IN GS25 (SUTURE) ×3 IMPLANT
SYR 50ML LL SCALE MARK (SYRINGE) ×3 IMPLANT
TAPE STRIPS DRAPE STRL (GAUZE/BANDAGES/DRESSINGS) ×3 IMPLANT
TIBIA MBT CEMENT SIZE 2.5 (Knees) ×3 IMPLANT
TRAY FOLEY MTR SLVR 14FR STAT (SET/KITS/TRAYS/PACK) ×3 IMPLANT
WRAP KNEE MAXI GEL POST OP (GAUZE/BANDAGES/DRESSINGS) ×3 IMPLANT
YANKAUER SUCT BULB TIP 10FT TU (MISCELLANEOUS) ×3 IMPLANT

## 2017-12-10 NOTE — Transfer of Care (Signed)
Immediate Anesthesia Transfer of Care Note  Patient: Lisa Poole  Procedure(s) Performed: LEFT TOTAL KNEE ARTHROPLASTY (Left Knee)  Patient Location: PACU  Anesthesia Type:Spinal  Level of Consciousness: awake and alert   Airway & Oxygen Therapy: Patient Spontanous Breathing and Patient connected to nasal cannula oxygen  Post-op Assessment: Report given to RN and Post -op Vital signs reviewed and stable  Post vital signs: Reviewed and stable  Last Vitals:  Vitals Value Taken Time  BP 88/57 12/10/2017  4:39 PM  Temp    Pulse 86 12/10/2017  4:41 PM  Resp 18 12/10/2017  4:41 PM  SpO2 93 % 12/10/2017  4:41 PM  Vitals shown include unvalidated device data.  Last Pain:  Vitals:   12/10/17 1215  TempSrc:   PainSc: 0-No pain      Patients Stated Pain Goal: 5 (35/39/12 2583)  Complications: No apparent anesthesia complications

## 2017-12-10 NOTE — Interval H&P Note (Signed)
History and Physical Interval Note:  12/10/2017 1:22 PM  Lisa Poole  has presented today for surgery, with the diagnosis of Left knee osteoarthritis  The various methods of treatment have been discussed with the patient and family. After consideration of risks, benefits and other options for treatment, the patient has consented to  Procedure(s): LEFT TOTAL KNEE ARTHROPLASTY (Left) as a surgical intervention .  The patient's history has been reviewed, patient examined, no change in status, stable for surgery.  I have reviewed the patient's chart and labs.  Questions were answered to the patient's satisfaction.     Emmanuelle Hibbitts ANDREW

## 2017-12-10 NOTE — Anesthesia Procedure Notes (Signed)
Anesthesia Regional Block: Adductor canal block   Pre-Anesthetic Checklist: ,, timeout performed, Correct Patient, Correct Site, Correct Laterality, Correct Procedure, Correct Position, site marked, Risks and benefits discussed,  Surgical consent,  Pre-op evaluation,  At surgeon's request and post-op pain management  Laterality: Left  Prep: chloraprep       Needles:  Injection technique: Single-shot  Needle Type: Echogenic Stimulator Needle     Needle Length: 9cm  Needle Gauge: 21   Needle insertion depth: 8 cm   Additional Needles:   Procedures:,,,, ultrasound used (permanent image in chart),,,,  Narrative:  Start time: 12/10/2017 12:05 PM End time: 12/10/2017 12:10 PM Injection made incrementally with aspirations every 5 mL.  Performed by: Personally  Anesthesiologist: Josephine Igo, MD  Additional Notes: Timeout performed. Patient sedated. Relevant anatomy ID'd using [place]. Incremental 2-29ml injection of LA with frequent aspiration. Patient tolerated procedure well.       Left Adductor Canal Block

## 2017-12-10 NOTE — Op Note (Signed)
DATE OF SURGERY:  12/10/2017  TIME: 4:22 PM  PATIENT NAME:  Lisa Poole    AGE: 52 y.o.   PRE-OPERATIVE DIAGNOSIS:  Left knee osteoarthritis  POST-OPERATIVE DIAGNOSIS:  Left knee osteoarthritis  PROCEDURE:  Procedure(s): LEFT TOTAL KNEE ARTHROPLASTY  SURGEON:  Evalynne Locurto ANDREW  ASSISTANT:  Bryson Stilwell, PA-C, present and scrubbed throughout the case, critical for assistance with exposure, retraction, instrumentation, and closure.  OPERATIVE IMPLANTS: Depuy PFC Sigma Rotating Platform.  Femur size 3, Tibia size 21/2, Patella size 32 3-peg oval button, with a 10 mm polyethylene insert.   PREOPERATIVE INDICATIONS:   Lisa Poole is a 52 y.o. year old female with end stage bone on bone arthritis of the knee who failed conservative treatment and elected for Total Knee Arthroplasty.   The risks, benefits, and alternatives were discussed at length including but not limited to the risks of infection, bleeding, nerve injury, stiffness, blood clots, the need for revision surgery, cardiopulmonary complications, among others, and they were willing to proceed.  OPERATIVE DESCRIPTION:  The patient was brought to the operative room and placed in a supine position.  Spinal anesthesia was administered.  IV antibiotics were given.  The lower extremity was prepped and draped in the usual sterile fashion.  Time out was performed.  The leg was elevated and exsanguinated and the tourniquet was inflated.  Anterior quadriceps tendon splitting approach was performed.  The patella was retracted and osteophytes were removed.  The anterior horn of the medial and lateral meniscus was removed and cruciate ligaments resected.   The distal femur was opened with the drill and the intramedullary distal femoral cutting jig was utilized, set at 5 degrees resecting 10 mm off the distal femur.  Care was taken to protect the collateral ligaments.  The distal femoral sizing jig was applied, taking care to  avoid notching.  Then the 4-in-1 cutting jig was applied and the anterior and posterior femur was cut, along with the chamfer cuts.    Then the extramedullary tibial cutting jig was utilized making the appropriate cut using the anterior tibial crest as a reference building in appropriate posterior slope.  Care was taken during the cut to protect the medial and collateral ligaments.  The proximal tibia was removed along with the posterior horns of the menisci.   The posterior medial femoral osteophytes and posterior lateral femoral osteophytes were removed.    The flexion gap was then measured and was symmetric with the extension gap, measured at 10.  I completed the distal femoral preparation using the appropriate jig to prepare the box.  The patella was then measured, and cut with the saw.    The proximal tibia sized and prepared accordingly with the reamer and the punch, and then all components were trialed with the trial insert.  The knee was found to have excellent balance and full motion.    The above named components were then cemented into place and all excess cement was removed.  The trial polyethylene component was in place during cementation, and then was exchanged for the real polyethylene component.    The knee was easily taken through a range of motion and the patella tracked well and the knee irrigated copiously and the parapatellar and subcutaneous tissue closed with vicryl, and monocryl with steri strips for the skin.  The arthrotomy was closed at 90 of flexion. The wounds were dressed with sterile gauze and the tourniquet released and the patient was awakened and returned to the PACU  in stable and satisfactory condition.  There were no complications.  Total tourniquet time was 90 minutes.

## 2017-12-10 NOTE — Progress Notes (Signed)
AssistedDr. Foster with left, ultrasound guided, adductor canal block. Side rails up, monitors on throughout procedure. See vital signs in flow sheet. Tolerated Procedure well.  

## 2017-12-10 NOTE — Anesthesia Procedure Notes (Signed)
Spinal  Patient location during procedure: OR Start time: 12/10/2017 2:21 PM Staffing Anesthesiologist: Josephine Igo, MD Performed: anesthesiologist  Preanesthetic Checklist Completed: patient identified, site marked, surgical consent, pre-op evaluation, timeout performed, IV checked, risks and benefits discussed and monitors and equipment checked Spinal Block Patient position: sitting Prep: site prepped and draped and DuraPrep Patient monitoring: heart rate, cardiac monitor, continuous pulse ox and blood pressure Approach: midline Location: L3-4 Injection technique: single-shot Needle Needle type: Pencan  Needle gauge: 24 G Needle length: 9 cm Needle insertion depth: 7 cm Assessment Sensory level: T4 Additional Notes Patient tolerated procedure well. Adequate sensory level.

## 2017-12-10 NOTE — Anesthesia Preprocedure Evaluation (Signed)
Anesthesia Evaluation  Patient identified by MRN, date of birth, ID band Patient awake    Reviewed: Allergy & Precautions, NPO status , Patient's Chart, lab work & pertinent test results, reviewed documented beta blocker date and time   Airway Mallampati: III  TM Distance: >3 FB Neck ROM: Full    Dental no notable dental hx. (+) Teeth Intact   Pulmonary neg pulmonary ROS,    Pulmonary exam normal breath sounds clear to auscultation       Cardiovascular hypertension, Pt. on medications and Pt. on home beta blockers + Valvular Problems/Murmurs AI  Rhythm:Regular Rate:Bradycardia + Diastolic murmurs    Neuro/Psych negative neurological ROS  negative psych ROS   GI/Hepatic   Endo/Other  diabetes, Well Controlled, Type 2, Oral Hypoglycemic AgentsMorbid obesity  Renal/GU   negative genitourinary   Musculoskeletal  (+) Arthritis , Osteoarthritis,  OA left knee   Abdominal   Peds  Hematology   Anesthesia Other Findings   Reproductive/Obstetrics                             Anesthesia Physical Anesthesia Plan  ASA: III  Anesthesia Plan: Spinal   Post-op Pain Management:  Regional for Post-op pain   Induction:   PONV Risk Score and Plan: 3 and Midazolam, Scopolamine patch - Pre-op, Ondansetron, Propofol infusion and Treatment may vary due to age or medical condition  Airway Management Planned: Natural Airway, Nasal Cannula and Simple Face Mask  Additional Equipment:   Intra-op Plan:   Post-operative Plan:   Informed Consent: I have reviewed the patients History and Physical, chart, labs and discussed the procedure including the risks, benefits and alternatives for the proposed anesthesia with the patient or authorized representative who has indicated his/her understanding and acceptance.   Dental advisory given  Plan Discussed with: CRNA and Surgeon  Anesthesia Plan Comments:          Anesthesia Quick Evaluation

## 2017-12-10 NOTE — Anesthesia Postprocedure Evaluation (Signed)
Anesthesia Post Note  Patient: Lisa Poole  Procedure(s) Performed: LEFT TOTAL KNEE ARTHROPLASTY (Left Knee)     Patient location during evaluation: PACU Anesthesia Type: Spinal Level of consciousness: oriented and awake and alert Pain management: pain level controlled Vital Signs Assessment: post-procedure vital signs reviewed and stable Respiratory status: spontaneous breathing, respiratory function stable, patient connected to nasal cannula oxygen and nonlabored ventilation Cardiovascular status: blood pressure returned to baseline and stable Postop Assessment: no headache, no backache, no apparent nausea or vomiting, patient able to bend at knees and spinal receding Anesthetic complications: no    Last Vitals:  Vitals:   12/10/17 1818 12/10/17 1930  BP: (!) 105/58 116/72  Pulse: 75 67  Resp: 14 16  Temp: 36.8 C 36.5 C  SpO2: 98% 100%    Last Pain:  Vitals:   12/10/17 1930  TempSrc: Oral  PainSc:                  Gotti Alwin A.

## 2017-12-11 LAB — CBC
HCT: 42.8 % (ref 36.0–46.0)
Hemoglobin: 13.8 g/dL (ref 12.0–15.0)
MCH: 27.4 pg (ref 26.0–34.0)
MCHC: 32.2 g/dL (ref 30.0–36.0)
MCV: 85.1 fL (ref 78.0–100.0)
PLATELETS: 345 10*3/uL (ref 150–400)
RBC: 5.03 MIL/uL (ref 3.87–5.11)
RDW: 13.2 % (ref 11.5–15.5)
WBC: 11.2 10*3/uL — AB (ref 4.0–10.5)

## 2017-12-11 LAB — BASIC METABOLIC PANEL
ANION GAP: 10 (ref 5–15)
BUN: 18 mg/dL (ref 6–20)
CALCIUM: 9.3 mg/dL (ref 8.9–10.3)
CO2: 27 mmol/L (ref 22–32)
CREATININE: 0.78 mg/dL (ref 0.44–1.00)
Chloride: 103 mmol/L (ref 98–111)
Glucose, Bld: 203 mg/dL — ABNORMAL HIGH (ref 70–99)
Potassium: 4.5 mmol/L (ref 3.5–5.1)
Sodium: 140 mmol/L (ref 135–145)

## 2017-12-11 LAB — GLUCOSE, CAPILLARY
GLUCOSE-CAPILLARY: 170 mg/dL — AB (ref 70–99)
Glucose-Capillary: 209 mg/dL — ABNORMAL HIGH (ref 70–99)

## 2017-12-11 MED ORDER — HYDROMORPHONE HCL 2 MG PO TABS
2.0000 mg | ORAL_TABLET | ORAL | Status: AC | PRN
  Administered 2017-12-11 – 2017-12-12 (×3): 2 mg via ORAL
  Filled 2017-12-11 (×3): qty 1

## 2017-12-11 MED ORDER — METFORMIN HCL ER 500 MG PO TB24
500.0000 mg | ORAL_TABLET | Freq: Two times a day (BID) | ORAL | Status: DC
  Administered 2017-12-11 – 2017-12-13 (×4): 500 mg via ORAL
  Filled 2017-12-11 (×4): qty 1

## 2017-12-11 MED ORDER — CANAGLIFLOZIN 100 MG PO TABS
100.0000 mg | ORAL_TABLET | Freq: Every day | ORAL | Status: DC
  Administered 2017-12-12 – 2017-12-13 (×2): 100 mg via ORAL
  Filled 2017-12-11 (×2): qty 1

## 2017-12-11 NOTE — Evaluation (Signed)
Physical Therapy Evaluation Patient Details Name: Lisa Poole MRN: [id number] DOB: 1966/04/20 Today's Date: 12/11/2017   History of Present Illness  Patient is a 52 y/o female admitted for L TKA.  Clinical Impression  Patient presents with decreased mobility due to pain and limited AROM L knee.  Feel she will benefit from skilled PT in the acute setting and follow up PT at d/c.      Follow Up Recommendations Follow surgeon's recommendation for DC plan and follow-up therapies    Equipment Recommendations  Rolling walker with 5" wheels    Recommendations for Other Services       Precautions / Restrictions Precautions Precautions: Fall;Knee Required Braces or Orthoses: Knee Immobilizer - Left Restrictions Weight Bearing Restrictions: No      Mobility  Bed Mobility               General bed mobility comments: up OOB  Transfers Overall transfer level: Needs assistance Equipment used: Rolling walker (2 wheeled) Transfers: Sit to/from Stand Sit to Stand: Min guard            Ambulation/Gait Ambulation/Gait assistance: Supervision;Min guard Gait Distance (Feet): 180 Feet Assistive device: Rolling walker (2 wheeled) Gait Pattern/deviations: Step-through pattern;Decreased stride length     General Gait Details: steady with KI stiff leg gait and mild antalgia  Stairs            Wheelchair Mobility    Modified Rankin (Stroke Patients Only)       Balance Overall balance assessment: Mild deficits observed, not formally tested             Standing balance comment: washed hands no UE support at sink                             Pertinent Vitals/Pain Pain Assessment: 0-10 Pain Score: 8  Pain Location: L knee Pain Descriptors / Indicators: Discomfort;Sore Pain Intervention(s): Monitored during session;Repositioned;Ice applied    Home Living Family/patient expects to be discharged to:: Private residence Living Arrangements:  Spouse/significant other Available Help at Discharge: Family Type of Home: House Home Access: Stairs to enter Entrance Stairs-Rails: Right Entrance Stairs-Number of Steps: 3 Home Layout: One level Home Equipment: None      Prior Function Level of Independence: Independent         Comments: working as a Restaurant manager, fast food        Extremity/Trunk Assessment   Upper Extremity Assessment Upper Extremity Assessment: Overall WFL for tasks assessed    Lower Extremity Assessment Lower Extremity Assessment: LLE deficits/detail LLE Deficits / Details: AAROM about -15 to 31, positive SLR with quad lag       Communication   Communication: No difficulties  Cognition Arousal/Alertness: Awake/alert Behavior During Therapy: WFL for tasks assessed/performed Overall Cognitive Status: Within Functional Limits for tasks assessed                                        General Comments      Exercises Total Joint Exercises Ankle Circles/Pumps: AROM;10 reps;Both;Supine Quad Sets: AROM;10 reps;Left;Supine Short Arc Quad: AROM;10 reps;Left;Supine Heel Slides: AAROM;10 reps;Left;Supine Hip ABduction/ADduction: AROM;10 reps;Left;Supine Straight Leg Raises: AROM;10 reps;Left;Supine   Assessment/Plan    PT Assessment Patient needs continued PT services  PT Problem List Decreased strength;Decreased range of motion;Decreased balance;Pain;Decreased knowledge of use of  DME;Decreased mobility       PT Treatment Interventions DME instruction;Therapeutic activities;Gait training;Therapeutic exercise;Patient/family education;Functional mobility training;Stair training    PT Goals (Current goals can be found in the Care Plan section)  Acute Rehab PT Goals Patient Stated Goal: to return to independent PT Goal Formulation: With patient Time For Goal Achievement: 12/15/17 Potential to Achieve Goals: Good    Frequency 7X/week   Barriers to discharge         Co-evaluation               AM-PAC PT "6 Clicks" Daily Activity  Outcome Measure Difficulty turning over in bed (including adjusting bedclothes, sheets and blankets)?: A Little Difficulty moving from lying on back to sitting on the side of the bed? : A Little Difficulty sitting down on and standing up from a chair with arms (e.g., wheelchair, bedside commode, etc,.)?: A Little Help needed moving to and from a bed to chair (including a wheelchair)?: A Little Help needed walking in hospital room?: A Little Help needed climbing 3-5 steps with a railing? : A Little 6 Click Score: 18    End of Session Equipment Utilized During Treatment: Gait belt;Left knee immobilizer Activity Tolerance: Patient tolerated treatment well Patient left: in bed;with call bell/phone within reach   PT Visit Diagnosis: Difficulty in walking, not elsewhere classified (R26.2);Pain Pain - Right/Left: Left Pain - part of body: Knee    Time: 6004-5997 PT Time Calculation (min) (ACUTE ONLY): 29 min   Charges:   PT Evaluation $PT Eval Low Complexity: 1 Low PT Treatments $Gait Training: 8-22 mins        [place], [place] [phone] 12/11/2017   Reginia Naas 12/11/2017, 11:05 AM

## 2017-12-11 NOTE — Plan of Care (Signed)
Patient struggling with pain management throughout night - Oxycodone 10 mg and Dilaudid IV 1.0 mg

## 2017-12-11 NOTE — Progress Notes (Signed)
   Subjective: 1 Day Post-Op Procedure(s) (LRB): LEFT TOTAL KNEE ARTHROPLASTY (Left)  Recheck left knee s/p total knee athroplasty C/o mild soreness/pain this morning Otherwise doing fairly well Denies any new symptoms or issues Patient reports pain as mild.  Objective:   VITALS:   Vitals:   12/11/17 0200 12/11/17 0631  BP: (!) 100/57 107/65  Pulse: 77 64  Resp: 16 16  Temp: 97.7 F (36.5 C) (!) 97.5 F (36.4 C)  SpO2: 96% 97%    Left knee dressing intact Drain pulled nv intact distally No rashes or edema distally  LABS Recent Labs    12/11/17 0554  HGB 13.8  HCT 42.8  WBC 11.2*  PLT 345    Recent Labs    12/11/17 0554  NA 140  K 4.5  BUN 18  CREATININE 0.78  GLUCOSE 203*     Assessment/Plan: 1 Day Post-Op Procedure(s) (LRB): LEFT TOTAL KNEE ARTHROPLASTY (Left) PT/OT Pain management Possible d/c tomorrow depending on therapy Pulmonary toilet    Brad Luna [place], MPAS [facility] is now [employer]  [place] [address], [place], [place], [place] [address] Phone: [phone] [email or web address] Facebook  [employer]

## 2017-12-11 NOTE — Progress Notes (Signed)
Physical Therapy Treatment Patient Details Name: Lisa Poole MRN: [id number] DOB: 01/10/1966 Today's Date: 12/11/2017    History of Present Illness Patient is a 52 y/o female admitted for L TKA.    PT Comments    Patient progressing with mobility and therex.  Able to negotiate steps appropriate for home entry.  Issued HEP for home and reviewed with patient.  Will need follow up PT at d/c.  Follow Up Recommendations  Follow surgeon's recommendation for DC plan and follow-up therapies     Equipment Recommendations  Rolling walker with 5" wheels    Recommendations for Other Services       Precautions / Restrictions Precautions Precautions: Fall;Knee Required Braces or Orthoses: Knee Immobilizer - Left    Mobility  Bed Mobility Overal bed mobility: Modified Independent             General bed mobility comments: up OOB  Transfers Overall transfer level: Needs assistance Equipment used: Rolling walker (2 wheeled) Transfers: Sit to/from Stand Sit to Stand: Supervision            Ambulation/Gait Ambulation/Gait assistance: Supervision;Min guard Gait Distance (Feet): 150 Feet Assistive device: Rolling walker (2 wheeled) Gait Pattern/deviations: Step-through pattern     General Gait Details: cues for heel strike and step through pattern without KI   Stairs Stairs: Yes Stairs assistance: Min assist Stair Management: Step to pattern;Sideways;One rail Left Number of Stairs: 3 General stair comments: cues for technique, sequence   Wheelchair Mobility    Modified Rankin (Stroke Patients Only)       Balance Overall balance assessment: Mild deficits observed, not formally tested             Standing balance comment: washed hands no UE support at sink                            Cognition Arousal/Alertness: Awake/alert Behavior During Therapy: WFL for tasks assessed/performed Overall Cognitive Status: Within Functional Limits for tasks  assessed                                        Exercises Total Joint Exercises Ankle Circles/Pumps: AROM;10 reps;Both;Supine Quad Sets: AROM;10 reps;Left;Supine Short Arc Quad: AROM;10 reps;Left;Supine Heel Slides: AAROM;10 reps;Left;Supine Hip ABduction/ADduction: AROM;10 reps;Left;Supine Straight Leg Raises: AROM;10 reps;Left;Supine    General Comments        Pertinent Vitals/Pain Pain Assessment: Faces Pain Score: 8  Faces Pain Scale: Hurts little more Pain Location: L knee Pain Descriptors / Indicators: Discomfort;Sore Pain Intervention(s): Monitored during session;Repositioned;Ice applied    Home Living Family/patient expects to be discharged to:: Private residence Living Arrangements: Spouse/significant other Available Help at Discharge: Family Type of Home: House Home Access: Stairs to enter Entrance Stairs-Rails: Right Home Layout: One level Home Equipment: None      Prior Function Level of Independence: Independent      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 : A Little Difficulty sitting down on and standing up from a chair with arms (e.g., wheelchair, bedside commode, etc,.)?: A Little Help needed moving to and from a bed to chair (including a wheelchair)?: A Little Help needed walking in hospital room?: A Little Help  needed climbing 3-5 steps with a railing? : A Little 6 Click Score: 18    End of Session Equipment Utilized During Treatment: Gait belt Activity Tolerance: Patient tolerated treatment well Patient left: with call bell/phone within reach;in bed;with family/visitor present   PT Visit Diagnosis: Difficulty in walking, not elsewhere classified (R26.2);Pain Pain - Right/Left: Left Pain - part of body: Knee     Time: 1350-1413 PT Time Calculation (min) (ACUTE ONLY): 23 min  Charges:  $Gait Training: 8-22 mins $Therapeutic Exercise: 8-22 mins                     [place], [place] [phone] 12/11/2017    Reginia Naas 12/11/2017, 2:52 PM

## 2017-12-11 NOTE — Care Management Note (Signed)
Case Management Note  Patient Details  Name: Lisa Poole MRN: [id number] Date of Birth: 1965/06/11  Subjective/Objective:  52 yo F s/p L TKA                 Action/Plan: HH and DME   Expected Discharge Date:  12/10/17               Expected Discharge Plan:  [place]  In-House Referral:     Discharge planning Services  CM Consult  Post Acute Care Choice:  Home Health Choice offered to:  Patient, Spouse  DME Arranged:  3-N-1, Walker rolling DME Agency:  [place]:  RN [place] Agency:  [facility] (formerly [facility])  Status of Service:  Completed, signed off  If discussed at [employer], dates discussed:    Additional Comments: Met with pt and husband. D/C plan is to return home with the support of her husband. She needs a RW and a 3-in-1 BSC. Referral made to [facility] prior to surgery and pt agrees with agency. Contacted Reggie at [facility] for DME referral.  Norina Buzzard, RN 12/11/2017, 1:58 PM

## 2017-12-12 LAB — CBC
HCT: 37.8 % (ref 36.0–46.0)
Hemoglobin: 12.2 g/dL (ref 12.0–15.0)
MCH: 27.7 pg (ref 26.0–34.0)
MCHC: 32.3 g/dL (ref 30.0–36.0)
MCV: 85.9 fL (ref 78.0–100.0)
PLATELETS: 344 10*3/uL (ref 150–400)
RBC: 4.4 MIL/uL (ref 3.87–5.11)
RDW: 13.5 % (ref 11.5–15.5)
WBC: 12.1 10*3/uL — ABNORMAL HIGH (ref 4.0–10.5)

## 2017-12-12 LAB — GLUCOSE, CAPILLARY
GLUCOSE-CAPILLARY: 120 mg/dL — AB (ref 70–99)
GLUCOSE-CAPILLARY: 131 mg/dL — AB (ref 70–99)
GLUCOSE-CAPILLARY: 130 mg/dL — AB (ref 70–99)
Glucose-Capillary: 121 mg/dL — ABNORMAL HIGH (ref 70–99)

## 2017-12-12 MED ORDER — HYDROMORPHONE HCL 1 MG/ML IJ SOLN
1.0000 mg | INTRAMUSCULAR | Status: DC | PRN
  Administered 2017-12-12 – 2017-12-13 (×4): 2 mg via INTRAVENOUS
  Filled 2017-12-12 (×5): qty 2

## 2017-12-12 NOTE — Progress Notes (Signed)
PT Cancellation Note  Patient Details Name: Lisa Poole MRN: [id number] DOB: 1966-01-05   Cancelled Treatment:    Reason Eval/Treat Not Completed: Patient declined, no reason specified; more comfortable and resting this pm.  States she will walk later on her own (family in room).  Reviewed activity progression and need for walker until progressing to cane with HHPT.     Reginia Naas 12/12/2017, 2:48 PM Magda Kiel, [place] 12/12/2017

## 2017-12-12 NOTE — Progress Notes (Signed)
   Subjective: 2 Days Post-Op Procedure(s) (LRB): LEFT TOTAL KNEE ARTHROPLASTY (Left)  Pt had a rough day with pain yesterday but has improved Ready for d/c home today Denies any new symptoms or issues Patient reports pain as moderate.  Objective:   VITALS:   Vitals:   12/11/17 2142 12/12/17 0546  BP: 118/84 112/67  Pulse: 67 65  Resp:  16  Temp: 97.9 F (36.6 C) 97.7 F (36.5 C)  SpO2: 98% 99%    Left knee incision healing well nv intact distally No rashes or edema  Good rom   LABS Recent Labs    12/11/17 0554 12/12/17 0408  HGB 13.8 12.2  HCT 42.8 37.8  WBC 11.2* 12.1*  PLT 345 344    Recent Labs    12/11/17 0554  NA 140  K 4.5  BUN 18  CREATININE 0.78  GLUCOSE 203*     Assessment/Plan: 2 Days Post-Op Procedure(s) (LRB): LEFT TOTAL KNEE ARTHROPLASTY (Left) D/c home today after therapy F/u in the office in 2 weeks Continue PT/OT Pain management    Kathrynn Speed, [place] is now [facility]  [place] [address], [place], [place], [place] [address] Phone: [phone] [email or web address] Facebook  [employer]

## 2017-12-12 NOTE — Progress Notes (Signed)
Physical Therapy Treatment Patient Details Name: Lisa Poole MRN: [id number] DOB: [birth year] Today's Date: 12/12/2017    History of Present Illness Patient is a 52 y/o female admitted for L TKA.    PT Comments    Educated in using stool for car transfer into SUV.  Feel she is continuing to progress despite significant increase in pain this morning.  Will check back later today for continued progress if tolerates.   Follow Up Recommendations  Follow surgeon's recommendation for DC plan and follow-up therapies;Home health PT     Equipment Recommendations  Rolling walker with 5" wheels    Recommendations for Other Services       Precautions / Restrictions Precautions Precautions: Fall;Knee Required Braces or Orthoses: Knee Immobilizer - Left    Mobility  Bed Mobility Overal bed mobility: Modified Independent                Transfers Overall transfer level: Modified independent Equipment used: Rolling walker (2 wheeled) Transfers: Sit to/from Stand Sit to Stand: Modified independent (Device/Increase time)            Ambulation/Gait Ambulation/Gait assistance: Supervision;Min guard Gait Distance (Feet): 100 Feet Assistive device: Rolling walker (2 wheeled) Gait Pattern/deviations: Decreased stride length;Step-to pattern;Antalgic     General Gait Details: heavy UE support due to painful knee   Stairs             Wheelchair Mobility    Modified Rankin (Stroke Patients Only)       Balance Overall balance assessment: Mild deficits observed, not formally tested                                          Cognition Arousal/Alertness: Awake/alert Behavior During Therapy: WFL for tasks assessed/performed Overall Cognitive Status: Within Functional Limits for tasks assessed                                        Exercises Total Joint Exercises Heel Slides: AAROM;10 reps;Left;Seated Long Arc Quad: AROM;10  reps;Left;Seated Goniometric ROM: 15-60 grossly    General Comments        Pertinent Vitals/Pain Faces Pain Scale: Hurts whole lot Pain Location: L knee Pain Descriptors / Indicators: Sore;Crying;Guarding;Grimacing Pain Intervention(s): Monitored during session;Repositioned;Ice applied;Premedicated before session    Home Living                      Prior Function            PT Goals (current goals can now be found in the care plan section) Progress towards PT goals: Progressing toward goals    Frequency    7X/week      PT Plan Current plan remains appropriate    Co-evaluation              AM-PAC PT "6 Clicks" Daily Activity  Outcome Measure  Difficulty turning over in bed (including adjusting bedclothes, sheets and blankets)?: A Little Difficulty moving from lying on back to sitting on the side of the bed? : A Little Difficulty sitting down on and standing up from a chair with arms (e.g., wheelchair, bedside commode, etc,.)?: A Little Help needed moving to and from a bed to chair (including a wheelchair)?: A Little Help needed walking in hospital room?: A Little  Help needed climbing 3-5 steps with a railing? : A Little 6 Click Score: 18    End of Session Equipment Utilized During Treatment: Gait belt Activity Tolerance: Patient limited by pain Patient left: with call bell/phone within reach;in bed   PT Visit Diagnosis: Difficulty in walking, not elsewhere classified (R26.2);Pain Pain - Right/Left: Left Pain - part of body: Knee     Time: 0940-1000 PT Time Calculation (min) (ACUTE ONLY): 20 min  Charges:  $Gait Training: 8-22 mins                     [place], Lisa Poole [phone] 12/12/2017    Lisa Poole 12/12/2017, 11:55 AM

## 2017-12-12 NOTE — Plan of Care (Signed)
Plan of care discussed with patient. Patient having uncontrollable pain today. Multimodal pain approach discussed, as patient was refusing ice. ACE wrap removed. Old drainage noted on mepilex.

## 2017-12-12 NOTE — Discharge Summary (Signed)
Orthopedic Discharge Summary        Physician Discharge Summary  Patient ID: Lisa Poole MRN: [id number] DOB/AGE: 08/20/1965 52 y.o.  Admit date: 12/10/2017 Discharge date: 12/12/2017   Procedures:  Procedure(s) (LRB): LEFT TOTAL KNEE ARTHROPLASTY (Left)  Attending Physician:  Dr. Theda Sers  Admission Diagnoses:   Left knee end stage osteoarthritis  Discharge Diagnoses:  Left knee end stage osteoarthritis   Past Medical History:  Diagnosis Date  . Aortic insufficiency   . Diabetes mellitus without complication ([place])   . Hypercholesteremia   . Hypertension     PCP: Milford Cage, PA   Discharged Condition: good  Hospital Course:  Patient underwent the above stated procedure on 12/10/2017. Patient tolerated the procedure well and brought to the recovery room in good condition and subsequently to the floor. Patient had an uncomplicated hospital course and was stable for discharge.   Disposition: Discharge disposition: 01-Home or Self Care      with follow up in 2 weeks     Discharge Instructions    Call MD / Call 911   Complete by:  As directed    If you experience chest pain or shortness of breath, CALL 911 and be transported to the hospital emergency room.  If you develope a fever above 101 F, pus (white drainage) or increased drainage or redness at the wound, or calf pain, call your surgeon's office.   Call MD / Call 911   Complete by:  As directed    If you experience chest pain or shortness of breath, CALL 911 and be transported to the hospital emergency room.  If you develope a fever above 101 F, pus (white drainage) or increased drainage or redness at the wound, or calf pain, call your surgeon's office.   Constipation Prevention   Complete by:  As directed    Drink plenty of fluids.  Prune juice may be helpful.  You may use a stool softener, such as Colace (over the counter) 100 mg twice a day.  Use MiraLax (over the counter) for constipation as needed.     Constipation Prevention   Complete by:  As directed    Drink plenty of fluids.  Prune juice may be helpful.  You may use a stool softener, such as Colace (over the counter) 100 mg twice a day.  Use MiraLax (over the counter) for constipation as needed.   Diet - low sodium heart healthy   Complete by:  As directed    Diet - low sodium heart healthy   Complete by:  As directed    Discharge instructions   Complete by:  As directed    INSTRUCTIONS AFTER JOINT REPLACEMENT   Remove items at home which could result in a fall. This includes throw rugs or furniture in walking pathways ICE to the affected joint every three hours while awake for 30 minutes at a time, for at least the first 3-5 days, and then as needed for pain and swelling.  Continue to use ice for pain and swelling. You may notice swelling that will progress down to the foot and ankle.  This is normal after surgery.  Elevate your leg when you are not up walking on it.   Continue to use the breathing machine you got in the hospital (incentive spirometer) which will help keep your temperature down.  It is common for your temperature to cycle up and down following surgery, especially at night when you are not up moving around and  exerting yourself.  The breathing machine keeps your lungs expanded and your temperature down.   DIET:  As you were doing prior to hospitalization, we recommend a well-balanced diet.  DRESSING / WOUND CARE / SHOWERING  Keep the surgical dressing until follow up.  The dressing is water proof, so you can shower without any extra covering.  IF THE DRESSING FALLS OFF or the wound gets wet inside, change the dressing with sterile gauze.  Please use good hand washing techniques before changing the dressing.  Do not use any lotions or creams on the incision until instructed by your surgeon.    ACTIVITY  Increase activity slowly as tolerated, but follow the weight bearing instructions below.   No driving for 6 weeks  or until further direction given by your physician.  You cannot drive while taking narcotics.  No lifting or carrying greater than 10 lbs. until further directed by your surgeon. Avoid periods of inactivity such as sitting longer than an hour when not asleep. This helps prevent blood clots.  You may return to work once you are authorized by your doctor.     WEIGHT BEARING   Weight bearing as tolerated with assist device (walker, cane, etc) as directed, use it as long as suggested by your surgeon or therapist, typically at least 4-6 weeks.   EXERCISES  Results after joint replacement surgery are often greatly improved when you follow the exercise, range of motion and muscle strengthening exercises prescribed by your doctor. Safety measures are also important to protect the joint from further injury. Any time any of these exercises cause you to have increased pain or swelling, decrease what you are doing until you are comfortable again and then slowly increase them. If you have problems or questions, call your caregiver or physical therapist for advice.   Rehabilitation is important following a joint replacement. After just a few days of immobilization, the muscles of the leg can become weakened and shrink (atrophy).  These exercises are designed to build up the tone and strength of the thigh and leg muscles and to improve motion. Often times heat used for twenty to thirty minutes before working out will loosen up your tissues and help with improving the range of motion but do not use heat for the first two weeks following surgery (sometimes heat can increase post-operative swelling).   These exercises can be done on a training (exercise) mat, on the floor, on a table or on a bed. Use whatever works the best and is most comfortable for you.    Use music or television while you are exercising so that the exercises are a pleasant break in your day. This will make your life better with the exercises  acting as a break in your routine that you can look forward to.   Perform all exercises about fifteen times, three times per day or as directed.  You should exercise both the operative leg and the other leg as well.   Exercises include:   Quad Sets - Tighten up the muscle on the front of the thigh (Quad) and hold for 5-10 seconds.   Straight Leg Raises - With your knee straight (if you were given a brace, keep it on), lift the leg to 60 degrees, hold for 3 seconds, and slowly lower the leg.  Perform this exercise against resistance later as your leg gets stronger.  Leg Slides: Lying on your back, slowly slide your foot toward your buttocks, bending your knee up off  the floor (only go as far as is comfortable). Then slowly slide your foot back down until your leg is flat on the floor again.  Angel Wings: Lying on your back spread your legs to the side as far apart as you can without causing discomfort.  Hamstring Strength:  Lying on your back, push your heel against the floor with your leg straight by tightening up the muscles of your buttocks.  Repeat, but this time bend your knee to a comfortable angle, and push your heel against the floor.  You may put a pillow under the heel to make it more comfortable if necessary.   A rehabilitation program following joint replacement surgery can speed recovery and prevent re-injury in the future due to weakened muscles. Contact your doctor or a physical therapist for more information on knee rehabilitation.    CONSTIPATION  Constipation is defined medically as fewer than three stools per week and severe constipation as less than one stool per week.  Even if you have a regular bowel pattern at home, your normal regimen is likely to be disrupted due to multiple reasons following surgery.  Combination of anesthesia, postoperative narcotics, change in appetite and fluid intake all can affect your bowels.   YOU MUST use at least one of the following options; they  are listed in order of increasing strength to get the job done.  They are all available over the counter, and you may need to use some, POSSIBLY even all of these options:    Drink plenty of fluids (prune juice may be helpful) and high fiber foods Colace 100 mg by mouth twice a day  Senokot for constipation as directed and as needed Dulcolax (bisacodyl), take with full glass of water  Miralax (polyethylene glycol) once or twice a day as needed.  If you have tried all these things and are unable to have a bowel movement in the first 3-4 days after surgery call either your surgeon or your primary doctor.    If you experience loose stools or diarrhea, hold the medications until you stool forms back up.  If your symptoms do not get better within 1 week or if they get worse, check with your doctor.  If you experience "the worst abdominal pain ever" or develop nausea or vomiting, please contact the office immediately for further recommendations for treatment.   ITCHING:  If you experience itching with your medications, try taking only a single pain pill, or even half a pain pill at a time.  You can also use Benadryl over the counter for itching or also to help with sleep.   TED HOSE STOCKINGS:  Use stockings on both legs until for at least 2 weeks or as directed by physician office. They may be removed at night for sleeping.  MEDICATIONS:  See your medication summary on the "After Visit Summary" that nursing will review with you.  You may have some home medications which will be placed on hold until you complete the course of blood thinner medication.  It is important for you to complete the blood thinner medication as prescribed.  PRECAUTIONS:  If you experience chest pain or shortness of breath - call 911 immediately for transfer to the hospital emergency department.   If you develop a fever greater that 101 F, purulent drainage from wound, increased redness or drainage from wound, foul odor from the  wound/dressing, or calf pain - CONTACT YOUR SURGEON.  FOLLOW-UP APPOINTMENTS:  If you do not already have a post-op appointment, please call the office for an appointment to be seen by your surgeon.  Guidelines for how soon to be seen are listed in your "After Visit Summary", but are typically between 1-4 weeks after surgery.  OTHER INSTRUCTIONS:   Knee Replacement:  Do not place pillow under knee, focus on keeping the knee straight while resting. CPM instructions: 0-90 degrees, 2 hours in the morning, 2 hours in the afternoon, and 2 hours in the evening. Place foam block, curve side up under heel at all times except when in CPM or when walking.  DO NOT modify, tear, cut, or change the foam block in any way.  MAKE SURE YOU:  Understand these instructions.  Get help right away if you are not doing well or get worse.    Thank you for letting us be a part of your medical care team.  It is a privilege we respect greatly.  We hope these instructions will help you stay on track for a fast and full recovery!   Increase activity slowly as tolerated   Complete by:  As directed    Increase activity slowly as tolerated   Complete by:  As directed       Allergies as of 12/12/2017      Reactions   Ace Inhibitors Other (See Comments)   Flu like symptoms   Benazepril Other (See Comments)   Flu like symptoms   Lisinopril Other (See Comments)   Flu like symptoms   Lotensin [benazepril Hcl] Other (See Comments)   Flu like symptoms   Sulfa Antibiotics Nausea And Vomiting      Medication List    TAKE these medications   aspirin EC 325 MG tablet Take 1 tablet (325 mg total) by mouth 2 (two) times daily. What changed:    medication strength  how much to take  when to take this   gabapentin 300 MG capsule Commonly known as:  NEURONTIN Take 300 mg by mouth 2 (two) times daily.   HYDROcodone-acetaminophen 10-325 MG tablet Commonly known as:   NORCO Take 1 tablet by mouth every 6 (six) hours.   INVOKAMET XR 50-500 MG Tb24 Generic drug:  Canagliflozin-metFORMIN HCl ER Take 1 tablet by mouth 2 (two) times daily.   losartan-hydrochlorothiazide 100-12.5 MG tablet Commonly known as:  HYZAAR Take 1 tablet by mouth every morning.   lovastatin 10 MG tablet Commonly known as:  MEVACOR Take 10 mg by mouth at bedtime.   methocarbamol 500 MG tablet Commonly known as:  ROBAXIN Take 1 tablet (500 mg total) by mouth every 8 (eight) hours as needed for muscle spasms.   metoprolol tartrate 50 MG tablet Commonly known as:  LOPRESSOR Take 50 mg by mouth 2 (two) times daily.   oxyCODONE 5 MG immediate release tablet Commonly known as:  ROXICODONE Take 1 tablet (5 mg total) by mouth every 4 (four) hours as needed for breakthrough pain.   traZODone 100 MG tablet Commonly known as:  DESYREL Take 100 mg by mouth at bedtime.            Durable Medical Equipment  (From admission, onward)        Start     Ordered   12/11/17 1105  For home use only DME Walker rolling  Once    Question:  Patient needs a walker to treat with the following condition  Answer:  Surgery, elective   12/11/17 1107  12/11/17 1105  For home use only DME Bedside commode  Once    Question:  Patient needs a bedside commode to treat with the following condition  Answer:  Surgery, elective   12/11/17 1107        Signed: Ventura Bruns 12/12/2017, 7:05 AM  [facility] now [employer] [facility] [address], [place], [place], [place] [address] Phone: [facility]

## 2017-12-13 ENCOUNTER — Encounter (HOSPITAL_COMMUNITY): Payer: Self-pay | Admitting: Specialist

## 2017-12-13 LAB — CBC
HCT: 40 % (ref 36.0–46.0)
HEMOGLOBIN: 12.8 g/dL (ref 12.0–15.0)
MCH: 27.5 pg (ref 26.0–34.0)
MCHC: 32 g/dL (ref 30.0–36.0)
MCV: 86 fL (ref 78.0–100.0)
PLATELETS: 346 10*3/uL (ref 150–400)
RBC: 4.65 MIL/uL (ref 3.87–5.11)
RDW: 13.9 % (ref 11.5–15.5)
WBC: 10.7 10*3/uL — ABNORMAL HIGH (ref 4.0–10.5)

## 2017-12-13 LAB — GLUCOSE, CAPILLARY: GLUCOSE-CAPILLARY: 148 mg/dL — AB (ref 70–99)

## 2017-12-13 NOTE — Progress Notes (Signed)
Physical Therapy Treatment Patient Details Name: Lisa Poole MRN: [id number] DOB: 08/03/1965 Today's Date: 12/13/2017    History of Present Illness Patient is a 52 y/o female admitted for L TKA.    PT Comments    POD # 3 Spouse present during session   Assisted with getting OOB, amb in hallway, stairs and TE's following HEP handout.     Follow Up Recommendations  Follow surgeon's recommendation for DC plan and follow-up therapies;Home health PT     Equipment Recommendations  Rolling walker with 5" wheels    Recommendations for Other Services       Precautions / Restrictions Precautions Precautions: Fall;Knee Precaution Comments: did not use KI this session Restrictions Weight Bearing Restrictions: No Other Position/Activity Restrictions: WBAT    Mobility  Bed Mobility Overal bed mobility: Needs Assistance Bed Mobility: Supine to Sit;Sit to Supine       Sit to supine: Min assist   General bed mobility comments: assist for L LE  Transfers Overall transfer level: Needs assistance Equipment used: Rolling walker (2 wheeled) Transfers: Sit to/from [facility] Sit to Stand: Supervision;Min guard Stand pivot transfers: Supervision;Min guard       General transfer comment: instructed spouse on safe handling and how to assist L LE   Ambulation/Gait Ambulation/Gait assistance: Supervision Gait Distance (Feet): 145 Feet Assistive device: Rolling walker (2 wheeled) Gait Pattern/deviations: Decreased stride length;Step-to pattern;Antalgic Gait velocity: decreased   General Gait Details: safety with turns   Stairs Stairs: Yes   Stair Management: Step to pattern;Sideways;One rail Left Number of Stairs: 3 General stair comments: <25% VC's on proper tech performed with spouse   Wheelchair Mobility    Modified Rankin (Stroke Patients Only)       Balance                                            Cognition  Arousal/Alertness: Awake/alert Behavior During Therapy: WFL for tasks assessed/performed Overall Cognitive Status: Within Functional Limits for tasks assessed                                        Exercises   Total Knee Replacement TE's 10 reps B LE ankle pumps 10 reps towel squeezes 10 reps knee presses 10 reps heel slides  10 reps SAQ's 10 reps SLR's 10 reps ABD Followed by ICE     General Comments        Pertinent Vitals/Pain Pain Assessment: Faces Pain Score: 6  Pain Location: L knee Pain Descriptors / Indicators: Sore;Tender;Operative site guarding Pain Intervention(s): Monitored during session;Patient requesting pain meds-RN notified;Premedicated before session;Ice applied    Home Living                      Prior Function            PT Goals (current goals can now be found in the care plan section)      Frequency    7X/week      PT Plan Current plan remains appropriate    Co-evaluation              AM-PAC PT "6 Clicks" Daily Activity  Outcome Measure  Difficulty turning over in bed (including adjusting bedclothes, sheets and blankets)?: A Little Difficulty  moving from lying on back to sitting on the side of the bed? : A Little Difficulty sitting down on and standing up from a chair with arms (e.g., wheelchair, bedside commode, etc,.)?: A Little Help needed moving to and from a bed to chair (including a wheelchair)?: A Little Help needed walking in hospital room?: A Little Help needed climbing 3-5 steps with a railing? : A Little 6 Click Score: 18    End of Session   Activity Tolerance: Patient tolerated treatment well Patient left: in bed Nurse Communication: (pt ready for D/C to home) PT Visit Diagnosis: Difficulty in walking, not elsewhere classified (R26.2);Pain Pain - Right/Left: Left Pain - part of body: Knee     Time: 6606-3016 PT Time Calculation (min) (ACUTE ONLY): 38 min  Charges:  $Gait  Training: 8-22 mins $Therapeutic Exercise: 8-22 mins $Therapeutic Activity: 8-22 mins                     Rica Koyanagi  PTA [facility]  Acute  Rehab Pager      [phone]

## 2017-12-13 NOTE — Progress Notes (Signed)
Subjective: 3 Days Post-Op Procedure(s) (LRB): LEFT TOTAL KNEE ARTHROPLASTY (Left) Patient reports pain as improved from yesterday. Tolerating PO's well. Positive BM. Progress with PT.Denies SOB, CP, or claf pain. Reports she is ready for D/c.  Objective: Vital signs in last 24 hours: Temp:  [98.3 F (36.8 C)-100.1 F (37.8 C)] 99.6 F (37.6 C) (08/05 0550) Pulse Rate:  [72-90] 90 (08/05 0550) Resp:  [15-17] 17 (08/05 0550) BP: (107-133)/(63-83) 120/65 (08/05 0550) SpO2:  [91 %-99 %] 91 % (08/05 0550)  Intake/Output from previous day: 08/04 0701 - 08/05 0700 In: 740 [P.O.:720; I.V.:20] Out: 3200 [Urine:3200] Intake/Output this shift: No intake/output data recorded.  Recent Labs    12/11/17 0554 12/12/17 0408 12/13/17 0416  HGB 13.8 12.2 12.8   Recent Labs    12/12/17 0408 12/13/17 0416  WBC 12.1* 10.7*  RBC 4.40 4.65  HCT 37.8 40.0  PLT 344 346   Recent Labs    12/11/17 0554  NA 140  K 4.5  CL 103  CO2 27  BUN 18  CREATININE 0.78  GLUCOSE 203*  CALCIUM 9.3   No results for input(s): LABPT, INR in the last 72 hours.  Alert and oriented x3. RRR, Lungs clear, BS x4. Left Calf soft and non tender. L knee dressing C/D/I. No DVT signs. No signs of infection or compartment syndrome. LLE grossly neurovascularly intact.   Anticipated LOS equal to or greater than 2 midnights due to - Age 8 and older with one or more of the following:  - Obesity  - Expected need for hospital services (PT, OT, Nursing) required for safe  discharge  - Anticipated need for postoperative skilled nursing care or inpatient rehab  - Active co-morbidities: Chronic pain requiring opiods OR   - Unanticipated findings during/Post Surgery: None  - Patient is a high risk of re-admission due to: None   Assessment/Plan: 3 Days Post-Op Procedure(s) (LRB): LEFT TOTAL KNEE ARTHROPLASTY (Left) Advance diet Up with therapy Discharge home with home health  Change Aquacel dressing F/u in  office.  Follow Instruction ASA 325mg  BID     Mairim Bade L 12/13/2017, 7:47 AM

## 2018-07-30 ENCOUNTER — Emergency Department (HOSPITAL_COMMUNITY): Payer: Medicare Other

## 2018-07-30 ENCOUNTER — Encounter (HOSPITAL_COMMUNITY): Payer: Self-pay

## 2018-07-30 ENCOUNTER — Emergency Department (HOSPITAL_COMMUNITY)
Admission: EM | Admit: 2018-07-30 | Discharge: 2018-07-30 | Disposition: A | Discharge status: Home / Self Care | Payer: Medicare Other | Attending: Emergency Medicine | Admitting: Emergency Medicine

## 2018-07-30 ENCOUNTER — Other Ambulatory Visit: Payer: Self-pay

## 2018-07-30 DIAGNOSIS — R0789 Other chest pain: Secondary | ICD-10-CM | POA: Insufficient documentation

## 2018-07-30 DIAGNOSIS — E119 Type 2 diabetes mellitus without complications: Secondary | ICD-10-CM | POA: Insufficient documentation

## 2018-07-30 DIAGNOSIS — R0602 Shortness of breath: Secondary | ICD-10-CM

## 2018-07-30 DIAGNOSIS — J40 Bronchitis, not specified as acute or chronic: Secondary | ICD-10-CM | POA: Diagnosis not present

## 2018-07-30 DIAGNOSIS — Z96652 Presence of left artificial knee joint: Secondary | ICD-10-CM | POA: Diagnosis not present

## 2018-07-30 DIAGNOSIS — I10 Essential (primary) hypertension: Secondary | ICD-10-CM | POA: Insufficient documentation

## 2018-07-30 DIAGNOSIS — Z7984 Long term (current) use of oral hypoglycemic drugs: Secondary | ICD-10-CM | POA: Insufficient documentation

## 2018-07-30 DIAGNOSIS — Z79899 Other long term (current) drug therapy: Secondary | ICD-10-CM | POA: Diagnosis not present

## 2018-07-30 DIAGNOSIS — E876 Hypokalemia: Secondary | ICD-10-CM | POA: Insufficient documentation

## 2018-07-30 LAB — BASIC METABOLIC PANEL
Anion gap: 10 (ref 5–15)
BUN: 21 mg/dL — AB (ref 6–20)
CO2: 22 mmol/L (ref 22–32)
Calcium: 9.9 mg/dL (ref 8.9–10.3)
Chloride: 109 mmol/L (ref 98–111)
Creatinine, Ser: 0.73 mg/dL (ref 0.44–1.00)
GFR calc Af Amer: >60 mL/min (ref >60)
Glucose, Bld: 151 mg/dL — ABNORMAL HIGH (ref 70–99)
Potassium: 3 mmol/L — ABNORMAL LOW (ref 3.5–5.1)
Sodium: 141 mmol/L (ref 135–145)

## 2018-07-30 LAB — CBC
HCT: 44.8 % (ref 36.0–46.0)
Hemoglobin: 14.1 g/dL (ref 12.0–15.0)
MCH: 26.6 pg (ref 26.0–34.0)
MCHC: 31.5 g/dL (ref 30.0–36.0)
MCV: 84.4 fL (ref 80.0–100.0)
Platelets: 371 10*3/uL (ref 150–400)
RBC: 5.31 MIL/uL — ABNORMAL HIGH (ref 3.87–5.11)
RDW: 13.2 % (ref 11.5–15.5)
WBC: 12.2 10*3/uL — ABNORMAL HIGH (ref 4.0–10.5)
nRBC: 0 % (ref 0.0–0.2)

## 2018-07-30 LAB — I-STAT TROPONIN, ED
Troponin i, poc: 0.05 ng/mL (ref 0.00–0.08)
Troponin i, poc: 0.07 ng/mL (ref 0.00–0.08)

## 2018-07-30 LAB — I-STAT BETA HCG BLOOD, ED (MC, WL, AP ONLY): I-stat hCG, quantitative: <5 m[IU]/mL (ref <5)

## 2018-07-30 LAB — CBG MONITORING, ED: Glucose-Capillary: 87 mg/dL (ref 70–99)

## 2018-07-30 MED ORDER — LORAZEPAM 2 MG/ML IJ SOLN
1.0000 mg | Freq: Once | INTRAMUSCULAR | Status: AC
  Administered 2018-07-30: 1 mg via INTRAVENOUS
  Filled 2018-07-30: qty 1

## 2018-07-30 MED ORDER — IOHEXOL 350 MG/ML SOLN
100.0000 mL | Freq: Once | INTRAVENOUS | Status: AC | PRN
  Administered 2018-07-30: 100 mL via INTRAVENOUS

## 2018-07-30 MED ORDER — ALBUTEROL SULFATE HFA 108 (90 BASE) MCG/ACT IN AERS: 1.0000 | INHALATION_SPRAY | Freq: Four times a day (QID) | RESPIRATORY_TRACT | 0 refills | Status: AC | PRN

## 2018-07-30 MED ORDER — SODIUM CHLORIDE 0.9% FLUSH
3.0000 mL | Freq: Once | INTRAVENOUS | Status: AC
  Administered 2018-07-30: 3 mL via INTRAVENOUS

## 2018-07-30 MED ORDER — POTASSIUM CHLORIDE CRYS ER 20 MEQ PO TBCR
40.0000 meq | EXTENDED_RELEASE_TABLET | Freq: Once | ORAL | Status: AC
  Administered 2018-07-30: 40 meq via ORAL
  Filled 2018-07-30: qty 2

## 2018-07-30 MED ORDER — SODIUM CHLORIDE (PF) 0.9 % IJ SOLN
INTRAMUSCULAR | Status: AC
  Filled 2018-07-30: qty 50

## 2018-07-30 MED ORDER — ALBUTEROL SULFATE (2.5 MG/3ML) 0.083% IN NEBU
5.0000 mg | INHALATION_SOLUTION | Freq: Once | RESPIRATORY_TRACT | Status: AC
  Administered 2018-07-30: 5 mg via RESPIRATORY_TRACT
  Filled 2018-07-30: qty 6

## 2018-07-30 NOTE — ED Provider Notes (Signed)
[facility] DEPT Provider Note   CSN: [id number] Arrival date & time: 07/30/18  1228    History   Chief Complaint Chief Complaint  Patient presents with  . Chest Pain  . Shortness of Breath    HPI Lisa Poole is a 53 y.o. female.     Patient with a history of aortic insufficiency.  No history of any coronary artery disease.  Patient states that she has been having kind of left-sided chest pain intermittently and shortness of breath but the 2 do not necessarily go hand-in-hand.  Been ongoing for 4 days.  The chest pain is left-sided chest and definitely less than 15 minutes in duration.  Actually more like just a few minutes to seconds.  Shortness of breath is also intermittent.  No leg swelling.  She states that when she gets the shortness of breath can as a flushing feeling up into her neck area.  Patient is been told that the aortic insufficiency is mild.  She also has a history of diabetes high cholesterol and hypertension but no family history of premature coronary disease.  Patient without any upper respiratory symptoms or any flulike symptoms or fever.  No known coronavirus contacts or any travel to any high risk areas.     Past Medical History:  Diagnosis Date  . Aortic insufficiency   . Diabetes mellitus without complication ([place])   . Hypercholesteremia   . Hypertension     Patient Active Problem List   Diagnosis Date Noted  . Primary osteoarthritis of left knee 12/10/2017  . S/P knee replacement 12/10/2017  . Aortic insufficiency     Past Surgical History:  Procedure Laterality Date  . APPENDECTOMY    . ROTATOR CUFF REPAIR Bilateral 2017  . SINUS EXPLORATION    . TOTAL KNEE ARTHROPLASTY Left 12/10/2017   Procedure: LEFT TOTAL KNEE ARTHROPLASTY;  Surgeon: Sydnee Cabal, MD;  Location: [facility];  Service: Orthopedics;  Laterality: Left;  Adductor Block  . TUBAL LIGATION       OB History    Gravida  5   Para  4   Term  1   Preterm  3   AB  1   Living  4     SAB  1   TAB  0   Ectopic  0   Multiple  0   Live Births  4            Home Medications    Prior to Admission medications   Medication Sig Start Date End Date Taking? Authorizing Provider  gabapentin (NEURONTIN) 300 MG capsule Take 300 mg by mouth 2 (two) times daily.   Yes [provider]  HYDROcodone-acetaminophen (NORCO) 10-325 MG tablet Take 1 tablet by mouth every 6 (six) hours as needed for moderate pain or severe pain.    Yes [provider]  INVOKAMET XR 50-500 MG TB24 Take 1 tablet by mouth 2 (two) times daily. 12/12/15  Yes [provider]  losartan-hydrochlorothiazide (HYZAAR) 100-12.5 MG tablet Take 1 tablet by mouth every morning. 12/23/15  Yes [provider]  lovastatin (MEVACOR) 10 MG tablet Take 10 mg by mouth at bedtime.    Yes [provider]  metoprolol (LOPRESSOR) 50 MG tablet Take 50 mg by mouth 2 (two) times daily.   Yes [provider]  naproxen (NAPROSYN) 500 MG tablet Take 500 mg by mouth 3 (three) times daily.   Yes [provider]  methocarbamol (ROBAXIN) 500 MG  tablet Take 1 tablet (500 mg total) by mouth every 8 (eight) hours as needed for muscle spasms. Patient not taking: Reported on 07/30/2018 12/10/17   Wyatt Portela L, PA-C  oxyCODONE (ROXICODONE) 5 MG immediate release tablet Take 1 tablet (5 mg total) by mouth every 4 (four) hours as needed for breakthrough pain. Patient not taking: Reported on 07/30/2018 12/10/17 12/10/18  Lajean Manes, PA-C    Family History No family history on file.  Social History Social History   Tobacco Use  . Smoking status: Never Smoker  . Smokeless tobacco: Never Used  Substance Use Topics  . Alcohol use: No  . Drug use: No     Allergies   Ace inhibitors; Benazepril; Lisinopril; Lotensin [benazepril hcl]; and Sulfa antibiotics   Review of Systems Review of Systems  Constitutional: Negative for  chills and fever.  HENT: Negative for rhinorrhea and sore throat.   Eyes: Negative for visual disturbance.  Respiratory: Positive for shortness of breath. Negative for cough.   Cardiovascular: Positive for chest pain. Negative for leg swelling.  Gastrointestinal: Negative for abdominal pain, diarrhea, nausea and vomiting.  Genitourinary: Negative for dysuria.  Musculoskeletal: Negative for back pain and neck pain.  Skin: Negative for rash.  Neurological: Negative for dizziness, light-headedness and headaches.  Hematological: Does not bruise/bleed easily.  Psychiatric/Behavioral: Negative for confusion.     Physical Exam Updated Vital Signs BP 105/89   Pulse 61   Temp 97.6 F (36.4 C) (Oral)   Resp 20   Ht 1.524 m (5')   Wt 79.4 kg   LMP 07/25/2015 (Exact Date)   SpO2 98%   BMI 34.18 kg/m   Physical Exam Vitals signs and nursing note reviewed.  Constitutional:      General: She is not in acute distress.    Appearance: Normal appearance. She is well-developed.  HENT:     Head: Normocephalic and atraumatic.     Mouth/Throat:     Mouth: Mucous membranes are moist.  Eyes:     Extraocular Movements: Extraocular movements intact.     Conjunctiva/sclera: Conjunctivae normal.     Pupils: Pupils are equal, round, and reactive to light.  Neck:     Musculoskeletal: Neck supple.  Cardiovascular:     Rate and Rhythm: Normal rate and regular rhythm.     Heart sounds: No murmur.  Pulmonary:     Effort: Pulmonary effort is normal. No respiratory distress.     Breath sounds: Normal breath sounds.  Abdominal:     General: Bowel sounds are normal.     Palpations: Abdomen is soft.     Tenderness: There is no abdominal tenderness.  Musculoskeletal: Normal range of motion.        General: No swelling.  Skin:    General: Skin is warm and dry.     Capillary Refill: Capillary refill takes less than 2 seconds.  Neurological:     General: No focal deficit present.     Mental Status:  She is alert and oriented to person, place, and time.      ED Treatments / Results  Labs (all labs ordered are listed, but only abnormal results are displayed) Labs Reviewed  BASIC METABOLIC PANEL - Abnormal; Notable for the following components:      Result Value   Potassium 3.0 (*)    Glucose, Bld 151 (*)    BUN 21 (*)    All other components within normal limits  CBC - Abnormal; Notable for the following  components:   WBC 12.2 (*)    RBC 5.31 (*)    All other components within normal limits  I-STAT TROPONIN, ED  I-STAT BETA HCG BLOOD, ED ([facility], [facility], [facility] ONLY)  I-STAT TROPONIN, ED    EKG EKG Interpretation  Date/Time:  Saturday July 30 2018 12:34:03 EDT Ventricular Rate:  68 PR Interval:    QRS Duration: 131 QT Interval:  431 QTC Calculation: 459 R Axis:   20 Text Interpretation:  Sinus rhythm Right bundle branch block Confirmed by Fredia Sorrow [phone]) on 07/30/2018 1:13:48 PM   Radiology Dg Chest 2 View  Result Date: 07/30/2018 CLINICAL DATA:  Left-sided chest pain and shortness-of-breath. Aortic insufficiency. EXAM: CHEST - 2 VIEW COMPARISON:  Chest CT 06/05/2017 FINDINGS: Lungs are adequately inflated without consolidation or effusion. Cardiomediastinal silhouette, bones and soft tissues are normal. IMPRESSION: No active cardiopulmonary disease. Electronically Signed   By: Marin Olp M.D.   On: 07/30/2018 13:52    Procedures Procedures (including critical care time)  Medications Ordered in ED Medications  sodium chloride flush (NS) 0.9 % injection 3 mL (3 mLs Intravenous Given 07/30/18 1604)     Initial Impression / Assessment and Plan / ED Course  I have reviewed the triage vital signs and the nursing notes.  Pertinent labs & imaging results that were available during my care of the patient were reviewed by me and considered in my medical decision making (see chart for details).        Patient chest pattern clinically is atypical.  Initial troponin  was negative.  Repeat troponin being done just to make sure since she has been having recurrent chest pain although brief is not an unstable angina picture.  If second troponin is negative then patient can follow back up with her cardiologist at [facility] who follows her for the aortic insufficiency.  Patient does have a lot of cardiac risk factors but since the pain is so brief and has been going on for 4 days I feel if 2 troponins are negative she can be discharged home with precautions to return for chest pain starting to last 15 minutes or longer.  In addition she has had these spells or shortness of breath not hypoxic not tachycardic did give patient option for d-dimer but she wanted to go right to the CT Angie of chest so it has been ordered.  This is to rule out pulmonary embolism.  If negative and repeat troponin is negative patient can be discharged home with follow-up with her cardiologist.  Final Clinical Impressions(s) / ED Diagnoses   Final diagnoses:  Atypical chest pain  Shortness of breath    ED Discharge Orders    None       Fredia Sorrow, MD 07/30/18 [phone]

## 2018-07-30 NOTE — ED Notes (Signed)
RN notified that patient's ISTAT troponin value has increased from 0.05 at 13:19

## 2018-07-30 NOTE — ED Notes (Signed)
Pt reported that she was starting to get a headache, feeling like her blood sugar may be low.  This RN checked CBG, 87.  Pt encouraged to let staff know if she began to feel further symptoms such as jittery, diaphoretic, light headed.  Will continue to monitor.

## 2018-07-30 NOTE — ED Notes (Signed)
Patient given discharge teaching and verbalized understanding. Patient ambulated out of ED with a steady gait. 

## 2018-07-30 NOTE — ED Notes (Signed)
Pt provided ham sandwich, apple juice per her request.  MD aware.

## 2018-07-30 NOTE — ED Provider Notes (Signed)
  Physical Exam  BP 113/71   Pulse 66   Temp 97.6 F (36.4 C) (Oral)   Resp 13   Ht 5' (1.524 m)   Wt 79.4 kg   LMP 07/25/2015 (Exact Date)   SpO2 100%   BMI 34.18 kg/m   Physical Exam  ED Course/Procedures     Procedures  MDM  Care assumed at 4 pm for subjective SOB. No recent travel. Sign out pending CTA chest, delta trop.   7:21 PM Delta trop neg. CTA unremarkable. K 3.0, supplemented. Given ativan for anxiety and albuterol and felt better. Likely bronchitis vs anxiety. Will dc home with albuterol prn.      Drenda Freeze, MD 07/30/18 [place]

## 2018-07-30 NOTE — ED Triage Notes (Signed)
Pt states that she has a hx of aortic insufficiency. Pt states that she has been having left sided chest pain and can't catch her breath. Pt states this has been happening for 4 days, from 5-35 seconds.

## 2018-07-30 NOTE — Discharge Instructions (Addendum)
Take albuterol as needed for shortness of breath.   Also make an appointment to follow-up with your cardiologist at [facility].  Today's work-up without any acute cardiac findings or acute pulmonary findings.  Return for new or worse symptoms to include worse shortness of breath or for chest pain lasting 15 minutes or longer.

## 2018-09-26 ENCOUNTER — Ambulatory Visit: Payer: Medicare Other | Admitting: Obstetrics & Gynecology

## 2018-09-27 ENCOUNTER — Inpatient Hospital Stay (HOSPITAL_COMMUNITY)
Admission: EM | Admit: 2018-09-27 | Discharge: 2018-09-28 | DRG: 282 | Disposition: A | Discharge status: Home / Self Care | Payer: Medicare Other | Attending: Cardiovascular Disease | Admitting: Cardiovascular Disease

## 2018-09-27 ENCOUNTER — Other Ambulatory Visit (HOSPITAL_COMMUNITY): Payer: Medicare Other

## 2018-09-27 ENCOUNTER — Encounter (HOSPITAL_COMMUNITY): Payer: Self-pay | Admitting: Emergency Medicine

## 2018-09-27 ENCOUNTER — Other Ambulatory Visit: Payer: Self-pay

## 2018-09-27 ENCOUNTER — Emergency Department (HOSPITAL_COMMUNITY): Payer: Medicare Other

## 2018-09-27 DIAGNOSIS — E785 Hyperlipidemia, unspecified: Secondary | ICD-10-CM | POA: Diagnosis not present

## 2018-09-27 DIAGNOSIS — Z79899 Other long term (current) drug therapy: Secondary | ICD-10-CM

## 2018-09-27 DIAGNOSIS — E7800 Pure hypercholesterolemia, unspecified: Secondary | ICD-10-CM | POA: Diagnosis present

## 2018-09-27 DIAGNOSIS — Z888 Allergy status to other drugs, medicaments and biological substances status: Secondary | ICD-10-CM | POA: Diagnosis not present

## 2018-09-27 DIAGNOSIS — R0600 Dyspnea, unspecified: Secondary | ICD-10-CM | POA: Diagnosis present

## 2018-09-27 DIAGNOSIS — Z87891 Personal history of nicotine dependence: Secondary | ICD-10-CM | POA: Diagnosis not present

## 2018-09-27 DIAGNOSIS — Z96652 Presence of left artificial knee joint: Secondary | ICD-10-CM | POA: Diagnosis present

## 2018-09-27 DIAGNOSIS — E119 Type 2 diabetes mellitus without complications: Secondary | ICD-10-CM | POA: Diagnosis present

## 2018-09-27 DIAGNOSIS — I361 Nonrheumatic tricuspid (valve) insufficiency: Secondary | ICD-10-CM | POA: Diagnosis not present

## 2018-09-27 DIAGNOSIS — I21A1 Myocardial infarction type 2: Secondary | ICD-10-CM | POA: Diagnosis not present

## 2018-09-27 DIAGNOSIS — I351 Nonrheumatic aortic (valve) insufficiency: Secondary | ICD-10-CM | POA: Diagnosis not present

## 2018-09-27 DIAGNOSIS — E786 Lipoprotein deficiency: Secondary | ICD-10-CM | POA: Diagnosis not present

## 2018-09-27 DIAGNOSIS — R7989 Other specified abnormal findings of blood chemistry: Secondary | ICD-10-CM

## 2018-09-27 DIAGNOSIS — Z1159 Encounter for screening for other viral diseases: Secondary | ICD-10-CM | POA: Diagnosis not present

## 2018-09-27 DIAGNOSIS — I10 Essential (primary) hypertension: Secondary | ICD-10-CM | POA: Diagnosis present

## 2018-09-27 DIAGNOSIS — Z7982 Long term (current) use of aspirin: Secondary | ICD-10-CM

## 2018-09-27 DIAGNOSIS — F419 Anxiety disorder, unspecified: Secondary | ICD-10-CM | POA: Diagnosis not present

## 2018-09-27 DIAGNOSIS — I4510 Unspecified right bundle-branch block: Secondary | ICD-10-CM | POA: Diagnosis present

## 2018-09-27 DIAGNOSIS — I214 Non-ST elevation (NSTEMI) myocardial infarction: Secondary | ICD-10-CM | POA: Diagnosis present

## 2018-09-27 DIAGNOSIS — E1169 Type 2 diabetes mellitus with other specified complication: Secondary | ICD-10-CM | POA: Diagnosis not present

## 2018-09-27 DIAGNOSIS — Z882 Allergy status to sulfonamides status: Secondary | ICD-10-CM | POA: Diagnosis not present

## 2018-09-27 DIAGNOSIS — R0789 Other chest pain: Secondary | ICD-10-CM

## 2018-09-27 DIAGNOSIS — Z7984 Long term (current) use of oral hypoglycemic drugs: Secondary | ICD-10-CM | POA: Diagnosis not present

## 2018-09-27 DIAGNOSIS — R778 Other specified abnormalities of plasma proteins: Secondary | ICD-10-CM

## 2018-09-27 LAB — BASIC METABOLIC PANEL
Anion gap: 9 (ref 5–15)
BUN: 17 mg/dL (ref 6–20)
CO2: 24 mmol/L (ref 22–32)
Calcium: 9.5 mg/dL (ref 8.9–10.3)
Chloride: 105 mmol/L (ref 98–111)
Creatinine, Ser: 0.68 mg/dL (ref 0.44–1.00)
GFR calc Af Amer: >60 mL/min (ref >60)
GFR calc non Af Amer: >60 mL/min (ref >60)
Glucose, Bld: 125 mg/dL — ABNORMAL HIGH (ref 70–99)
Potassium: 3.9 mmol/L (ref 3.5–5.1)
Sodium: 138 mmol/L (ref 135–145)

## 2018-09-27 LAB — TROPONIN I
Troponin I: 0.09 ng/mL (ref <0.03)
Troponin I: 0.1 ng/mL (ref <0.03)
Troponin I: 0.12 ng/mL (ref <0.03)

## 2018-09-27 LAB — PROTIME-INR
INR: 1 (ref 0.8–1.2)
Prothrombin Time: 13.4 seconds (ref 11.4–15.2)

## 2018-09-27 LAB — CBC WITH DIFFERENTIAL/PLATELET
Abs Immature Granulocytes: 0.01 10*3/uL (ref 0.00–0.07)
Basophils Absolute: 0 10*3/uL (ref 0.0–0.1)
Basophils Relative: 1 %
Eosinophils Absolute: 0.2 10*3/uL (ref 0.0–0.5)
Eosinophils Relative: 3 %
HCT: 45.8 % (ref 36.0–46.0)
Hemoglobin: 14.7 g/dL (ref 12.0–15.0)
Immature Granulocytes: 0 %
Lymphocytes Relative: 27 %
Lymphs Abs: 1.5 10*3/uL (ref 0.7–4.0)
MCH: 27.5 pg (ref 26.0–34.0)
MCHC: 32.1 g/dL (ref 30.0–36.0)
MCV: 85.6 fL (ref 80.0–100.0)
Monocytes Absolute: 0.5 10*3/uL (ref 0.1–1.0)
Monocytes Relative: 8 %
Neutro Abs: 3.5 10*3/uL (ref 1.7–7.7)
Neutrophils Relative %: 61 %
Platelets: 354 10*3/uL (ref 150–400)
RBC: 5.35 MIL/uL — ABNORMAL HIGH (ref 3.87–5.11)
RDW: 13.6 % (ref 11.5–15.5)
WBC: 5.7 10*3/uL (ref 4.0–10.5)
nRBC: 0 % (ref 0.0–0.2)

## 2018-09-27 LAB — HEMOGLOBIN A1C
Hgb A1c MFr Bld: 6.2 % — ABNORMAL HIGH (ref 4.8–5.6)
Mean Plasma Glucose: 131.24 mg/dL

## 2018-09-27 LAB — SARS CORONAVIRUS 2 BY RT PCR (HOSPITAL ORDER, PERFORMED IN ~~LOC~~ HOSPITAL LAB): SARS Coronavirus 2: NEGATIVE

## 2018-09-27 LAB — APTT: aPTT: 26 seconds (ref 24–36)

## 2018-09-27 LAB — D-DIMER, QUANTITATIVE: D-Dimer, Quant: 0.47 ug/mL-FEU (ref 0.00–0.50)

## 2018-09-27 LAB — GLUCOSE, CAPILLARY
Glucose-Capillary: 97 mg/dL (ref 70–99)
Glucose-Capillary: 85 mg/dL (ref 70–99)
Glucose-Capillary: 111 mg/dL — ABNORMAL HIGH (ref 70–99)

## 2018-09-27 LAB — BRAIN NATRIURETIC PEPTIDE: B Natriuretic Peptide: 36.2 pg/mL (ref 0.0–100.0)

## 2018-09-27 MED ORDER — HYDROCODONE-ACETAMINOPHEN 10-325 MG PO TABS
1.0000 | ORAL_TABLET | Freq: Four times a day (QID) | ORAL | Status: DC | PRN
  Administered 2018-09-27 – 2018-09-28 (×2): 1 via ORAL
  Filled 2018-09-27 (×2): qty 1

## 2018-09-27 MED ORDER — ACETAMINOPHEN 325 MG PO TABS: 650.0000 mg | ORAL_TABLET | ORAL | Status: DC | PRN

## 2018-09-27 MED ORDER — METOPROLOL TARTRATE 50 MG PO TABS
50.0000 mg | ORAL_TABLET | Freq: Two times a day (BID) | ORAL | Status: DC
  Administered 2018-09-27 (×2): 50 mg via ORAL
  Filled 2018-09-27: qty 1
  Filled 2018-09-27 (×2): qty 2

## 2018-09-27 MED ORDER — SODIUM CHLORIDE 0.9% FLUSH
3.0000 mL | Freq: Two times a day (BID) | INTRAVENOUS | Status: DC
  Administered 2018-09-28: 3 mL via INTRAVENOUS

## 2018-09-27 MED ORDER — ASPIRIN 81 MG PO CHEW
324.0000 mg | CHEWABLE_TABLET | Freq: Once | ORAL | Status: AC
  Administered 2018-09-27: 12:00:00 324 mg via ORAL
  Filled 2018-09-27: qty 4

## 2018-09-27 MED ORDER — HEPARIN (PORCINE) 25000 UT/250ML-% IV SOLN
950.0000 [IU]/h | INTRAVENOUS | Status: DC
  Administered 2018-09-27 (×2): 750 [IU]/h via INTRAVENOUS
  Filled 2018-09-27: qty 250

## 2018-09-27 MED ORDER — SODIUM CHLORIDE 0.9 % IV SOLN: 250.0000 mL | INTRAVENOUS | Status: DC | PRN

## 2018-09-27 MED ORDER — NITROGLYCERIN 0.4 MG SL SUBL: 0.4000 mg | SUBLINGUAL_TABLET | SUBLINGUAL | Status: DC | PRN

## 2018-09-27 MED ORDER — ATORVASTATIN CALCIUM 40 MG PO TABS
40.0000 mg | ORAL_TABLET | Freq: Every day | ORAL | Status: DC
  Administered 2018-09-27 – 2018-09-28 (×2): 40 mg via ORAL
  Filled 2018-09-27 (×2): qty 1

## 2018-09-27 MED ORDER — ASPIRIN EC 81 MG PO TBEC: 81.0000 mg | DELAYED_RELEASE_TABLET | Freq: Every day | ORAL | Status: DC

## 2018-09-27 MED ORDER — SODIUM CHLORIDE 0.9 % WEIGHT BASED INFUSION
3.0000 mL/kg/h | INTRAVENOUS | Status: DC
  Administered 2018-09-28: 3 mL/kg/h via INTRAVENOUS

## 2018-09-27 MED ORDER — HEPARIN BOLUS VIA INFUSION
2500.0000 [IU] | Freq: Once | INTRAVENOUS | Status: AC
  Administered 2018-09-27: 2500 [IU] via INTRAVENOUS
  Filled 2018-09-27: qty 2500

## 2018-09-27 MED ORDER — ASPIRIN 81 MG PO CHEW
81.0000 mg | CHEWABLE_TABLET | ORAL | Status: AC
  Administered 2018-09-28: 81 mg via ORAL
  Filled 2018-09-27: qty 1

## 2018-09-27 MED ORDER — ONDANSETRON HCL 4 MG/2ML IJ SOLN: 4.0000 mg | Freq: Four times a day (QID) | INTRAMUSCULAR | Status: DC | PRN

## 2018-09-27 MED ORDER — SODIUM CHLORIDE 0.9% FLUSH: 3.0000 mL | INTRAVENOUS | Status: DC | PRN

## 2018-09-27 MED ORDER — NITROGLYCERIN 0.4 MG SL SUBL
0.4000 mg | SUBLINGUAL_TABLET | SUBLINGUAL | Status: DC | PRN
  Administered 2018-09-27: 0.4 mg via SUBLINGUAL
  Filled 2018-09-27: qty 1

## 2018-09-27 MED ORDER — ASPIRIN 300 MG RE SUPP: 300.0000 mg | RECTAL | Status: DC

## 2018-09-27 MED ORDER — SODIUM CHLORIDE 0.9 % WEIGHT BASED INFUSION: 1.0000 mL/kg/h | INTRAVENOUS | Status: DC

## 2018-09-27 MED ORDER — ASPIRIN 81 MG PO CHEW: 324.0000 mg | CHEWABLE_TABLET | ORAL | Status: DC

## 2018-09-27 NOTE — Progress Notes (Signed)
Patient scheduled for Cath tomorrow, I asked the RN Clair Gulling to have the night shift place IV team consult for tonight or first thing in the morning prior to the cath so the second IV can be placed.

## 2018-09-27 NOTE — ED Notes (Signed)
CRITICAL VALUE STICKER  CRITICAL VALUE: Troponin 0.09  RECEIVER (on-site recipient of call): Bartholome Bill, RN  [place] NOTIFIED: 09/27/18 1127  MESSENGER (representative from lab): Murvin Donning  MD NOTIFIED: MD Tyrone Nine  TIME OF NOTIFICATION: 1130  RESPONSE: Awaiting orders

## 2018-09-27 NOTE — ED Provider Notes (Signed)
[facility] DEPT Provider Note   CSN: [id number] Arrival date & time: 09/27/18  0950    History   Chief Complaint Chief Complaint  Patient presents with   Shortness of Breath    HPI Lisa Poole is a 53 y.o. female.     53 yo F with a chief complaint of shortness of breath.  This is been an ongoing issue for her.  Going on for the last few months at least.  She states that it is typically on exertion but now has occurred even at rest.  She denies chest pain or pressure with this.  Has been seen in the ED prior for this and had negative troponins and was discharged home.  She is followed up with a cardiologist and it was deemed to not be cardiac in etiology.  She denies history of MI has a history of hypertension hyperlipidemia and diabetes denies smoking or family history of MI.  She denies history of PE or DVT denies unilateral lower extremity edema denies hemoptysis denies recent surgery or immobilization denies recent prolonged travel denies estrogen use denies history of cancer.  The history is provided by the patient.  Shortness of Breath  Severity:  Moderate Onset quality:  Gradual Duration:  4 months Timing:  Constant Progression:  Worsening Chronicity:  New Relieved by:  Nothing Worsened by:  Nothing Ineffective treatments:  None tried Associated symptoms: no chest pain, no cough, no fever, no headaches, no vomiting and no wheezing     Past Medical History:  Diagnosis Date   Aortic insufficiency    Diabetes mellitus without complication ([place])    Hypercholesteremia    Hypertension     Patient Active Problem List   Diagnosis Date Noted   NSTEMI (non-ST elevated myocardial infarction) ([place]) 09/27/2018   Primary osteoarthritis of left knee 12/10/2017   S/P knee replacement 12/10/2017   Aortic insufficiency     Past Surgical History:  Procedure Laterality Date   APPENDECTOMY     ROTATOR CUFF REPAIR Bilateral 2017    SINUS EXPLORATION     TOTAL KNEE ARTHROPLASTY Left 12/10/2017   Procedure: LEFT TOTAL KNEE ARTHROPLASTY;  Surgeon: Sydnee Cabal, MD;  Location: [facility];  Service: Orthopedics;  Laterality: Left;  Adductor Block   TUBAL LIGATION       OB History    Gravida  5   Para  4   Term  1   Preterm  3   AB  1   Living  4     SAB  1   TAB  0   Ectopic  0   Multiple  0   Live Births  4            Home Medications    Prior to Admission medications   Medication Sig Start Date End Date Taking? Authorizing Provider  aspirin 81 MG chewable tablet Chew 81 mg by mouth daily.   Yes [provider]  gabapentin (NEURONTIN) 300 MG capsule Take 300 mg by mouth 2 (two) times daily.   Yes [provider]  HYDROcodone-acetaminophen (NORCO) 10-325 MG tablet Take 1 tablet by mouth every 6 (six) hours as needed for moderate pain or severe pain.    Yes [provider]  INVOKAMET XR 50-500 MG TB24 Take 1 tablet by mouth 2 (two) times daily. 12/12/15  Yes [provider]  losartan-hydrochlorothiazide (HYZAAR) 100-12.5 MG tablet Take 1 tablet by mouth every morning. 12/23/15  Yes [provider]  lovastatin (MEVACOR) 10 MG tablet Take 10 mg by mouth at bedtime.    Yes [provider]  metoprolol (LOPRESSOR) 50 MG tablet Take 50 mg by mouth 2 (two) times daily.   Yes [provider]  VITAMIN D PO Take 1 tablet by mouth daily.   Yes [provider]  albuterol (PROVENTIL HFA;VENTOLIN HFA) 108 (90 Base) MCG/ACT inhaler Inhale 1-2 puffs into the lungs every 6 (six) hours as needed for wheezing or shortness of breath. Patient not taking: Reported on 09/27/2018 07/30/18   Drenda Freeze, MD  methocarbamol (ROBAXIN) 500 MG tablet Take 1 tablet (500 mg total) by mouth every 8 (eight) hours as needed for muscle spasms. Patient not taking: Reported on 07/30/2018 12/10/17   Wyatt Portela L, PA-C  oxyCODONE (ROXICODONE) 5 MG immediate  release tablet Take 1 tablet (5 mg total) by mouth every 4 (four) hours as needed for breakthrough pain. Patient not taking: Reported on 07/30/2018 12/10/17 12/10/18  Lajean Manes, PA-C    Family History No family history on file.  Social History Social History   Tobacco Use   Smoking status: Never Smoker   Smokeless tobacco: Never Used  Substance Use Topics   Alcohol use: No   Drug use: No     Allergies   Ace inhibitors; Benazepril; Lisinopril; Lotensin [benazepril hcl]; and Sulfa antibiotics   Review of Systems Review of Systems  Constitutional: Negative for chills and fever.  HENT: Negative for congestion and rhinorrhea.   Eyes: Negative for redness and visual disturbance.  Respiratory: Positive for shortness of breath. Negative for cough and wheezing.   Cardiovascular: Negative for chest pain and palpitations.  Gastrointestinal: Negative for nausea and vomiting.  Genitourinary: Negative for dysuria and urgency.  Musculoskeletal: Negative for arthralgias and myalgias.  Skin: Negative for pallor and wound.  Neurological: Negative for dizziness and headaches.     Physical Exam Updated Vital Signs BP 115/69    Pulse 67    Temp 98.1 F (36.7 C) (Oral)    Resp 13    Ht 5' (1.524 m)    Wt 78.9 kg    LMP 07/25/2015 (Exact Date)    SpO2 100%    BMI 33.98 kg/m   Physical Exam Vitals signs and nursing note reviewed.  Constitutional:      General: She is not in acute distress.    Appearance: She is well-developed. She is not diaphoretic.  HENT:     Head: Normocephalic and atraumatic.     Comments: Swollen turbinates, posterior nasal drip, no noted sinus ttp, tm normal bilaterally.   Eyes:     Pupils: Pupils are equal, round, and reactive to light.  Neck:     Musculoskeletal: Normal range of motion and neck supple.  Cardiovascular:     Rate and Rhythm: Normal rate and regular rhythm.     Heart sounds: No murmur. No friction rub. No gallop.   Pulmonary:      Effort: Pulmonary effort is normal.     Breath sounds: No wheezing or rales.  Abdominal:     General: There is no distension.     Palpations: Abdomen is soft.     Tenderness: There is no abdominal tenderness.  Musculoskeletal:        General: No tenderness.  Skin:    General: Skin is warm and dry.  Neurological:     Mental Status: She is alert and oriented to person, place, and time.  Psychiatric:  Behavior: Behavior normal.      ED Treatments / Results  Labs (all labs ordered are listed, but only abnormal results are displayed) Labs Reviewed  TROPONIN I - Abnormal; Notable for the following components:      Result Value   Troponin I 0.09 (*)    All other components within normal limits  CBC WITH DIFFERENTIAL/PLATELET - Abnormal; Notable for the following components:   RBC 5.35 (*)    All other components within normal limits  BASIC METABOLIC PANEL - Abnormal; Notable for the following components:   Glucose, Bld 125 (*)    All other components within normal limits  SARS CORONAVIRUS 2 (HOSPITAL ORDER, [facility])  D-DIMER, QUANTITATIVE (NOT AT [facility])  PROTIME-INR  APTT    EKG EKG Interpretation  Date/Time:  Tuesday Sep 27 2018 10:00:43 EDT Ventricular Rate:  67 PR Interval:    QRS Duration: 126 QT Interval:  445 QTC Calculation: 470 R Axis:   58 Text Interpretation:  Sinus rhythm Right bundle branch block Baseline wander in lead(s) V1 No significant change since last tracing Confirmed by Deno Etienne [phone]) on 09/27/2018 10:04:04 AM   Radiology Dg Chest 2 View  Result Date: 09/27/2018 CLINICAL DATA:  Shortness of breath EXAM: CHEST - 2 VIEW COMPARISON:  07/30/2018 FINDINGS: Cardiac shadows within normal limits. The lungs are well aerated bilaterally. No focal infiltrate or sizable effusion is seen. No acute bony abnormality is noted. IMPRESSION: No acute abnormality noted. Electronically Signed   By: Inez Catalina M.D.   On: 09/27/2018  11:00    Procedures Procedures (including critical care time)  Medications Ordered in ED Medications  nitroGLYCERIN (NITROSTAT) SL tablet 0.4 mg (0.4 mg Sublingual Given 09/27/18 1139)  heparin bolus via infusion 2,500 Units (has no administration in time range)  heparin ADULT infusion 100 units/mL (25000 units/244mL sodium chloride 0.45%) (has no administration in time range)  aspirin chewable tablet 324 mg (324 mg Oral Given 09/27/18 1139)     Initial Impression / Assessment and Plan / ED Course  I have reviewed the triage vital signs and the nursing notes.  Pertinent labs & imaging results that were available during my care of the patient were reviewed by me and considered in my medical decision making (see chart for details).        53 yo F with a chief complaint of shortness of breath.  On my exam the patient is well-appearing and nontoxic she has had no difficulty breathing and satting percent on room air.  She is lying in complete sentences.  She is clear lung sounds bilaterally.  She has some signs of a URI.  I wonder if this is URI associated shortness of breath.  Will obtain a laboratory evaluation chest x-ray EKG.  A  Ddimer negative.  Trop + at 0.09.  The patient had seen her cardiologist at [facility], last had a stress test in 2018 that was reportedly unremarkable but had some mild aortic insufficiency.  To clarify the patient's symptoms she states that she has had no chest pressure or pain but feels a flushing feeling in her chest when she exerts herself that goes up into her face.  Associated with some increased shortness of breath.  Is having some symptoms at rest.  I will start the patient on heparin give aspirin.  Will discuss with cardiologist.  I discussed the case with Dr. Angelena Form.  He recommended that the patient be admitted over to [facility]  and will be evaluated by the cardiologist at that time.  CRITICAL CARE Performed by: Cecilio Asper   Total critical care time: 35 minutes  Critical care time was exclusive of separately billable procedures and treating other patients.  Critical care was necessary to treat or prevent imminent or life-threatening deterioration.  Critical care was time spent personally by me on the following activities: development of treatment plan with patient and/or surrogate as well as nursing, discussions with consultants, evaluation of patient's response to treatment, examination of patient, obtaining history from patient or surrogate, ordering and performing treatments and interventions, ordering and review of laboratory studies, ordering and review of radiographic studies, pulse oximetry and re-evaluation of patient's condition.  The patients results and plan were reviewed and discussed.   Any x-rays performed were independently reviewed by myself.   Differential diagnosis were considered with the presenting HPI.  Medications  nitroGLYCERIN (NITROSTAT) SL tablet 0.4 mg (0.4 mg Sublingual Given 09/27/18 1139)  heparin bolus via infusion 2,500 Units (has no administration in time range)  heparin ADULT infusion 100 units/mL (25000 units/270mL sodium chloride 0.45%) (has no administration in time range)  aspirin chewable tablet 324 mg (324 mg Oral Given 09/27/18 1139)    Vitals:   09/27/18 1001 09/27/18 1100 09/27/18 1139 09/27/18 1146  BP: 113/74 115/69    Pulse: 66 67    Resp: 17 13    Temp:      TempSrc:      SpO2: 100% 100%    Weight:   78.9 kg 78.9 kg  Height:   5' (1.524 m) 5' (1.524 m)    Final diagnoses:  NSTEMI (non-ST elevated myocardial infarction) [facility])    Admission/ observation were discussed with the admitting physician, patient and/or family and they are comfortable with the plan.   Final Clinical Impressions(s) / ED Diagnoses   Final diagnoses:  NSTEMI (non-ST elevated myocardial infarction) [facility])    ED Discharge Orders    None       Deno Etienne, DO 09/27/18  1211

## 2018-09-27 NOTE — H&P (Signed)
The patient has been seen in conjunction with Kathyrn Drown, NP-C. All aspects of care have been considered and discussed. The patient has been personally interviewed, examined, and all clinical data has been reviewed.   53 year old female with a several month history of chest discomfort and dyspnea.  Symptoms initially started as exertional chest tightness and dyspnea.  She was seen in the emergency room evaluated and found not to have a myocardial infarction.  She was referred back to her primary physician/cardiologist Dr. Sydnee Levans where a repeat echocardiogram was done about which she has no details.  Since March she has been suffering more with a near continuous sensation of tightness in the chest and dyspnea.  Came to the emergency room on this occasion at the instruction of her primary physician.  Risk factors include hyperlipidemia, type 2 diabetes mellitus, hypertension.  Has a history of aortic insufficiency without much detail available to [place].  Exam does not reveal a clinically audible aortic regurgitation murmur that I can hear.  Pulse pressure is certainly not suggestive of significant AR.  There is no rub on physical exam.  ECG is abnormal with poor R wave progression/incomplete right bundle.  This was present in March 2020.  Cardiac troponin I markers are mildly elevated.  We will perform coronary angiography tomorrow assuming no surprises are found on 2D Doppler echocardiography that would explain her current presentation.    Cardiology History and Physical:   Patient ID: Lisa Poole; [id number]; 1965/11/24   Admit date: 09/27/2018 Date of Consult: 09/27/2018  Primary Care Provider: Milford Cage, PA Primary Cardiologist: New to [facility]   Patient Profile:   Lisa Poole is a 53 y.o. female with a hx of HTN, HLD, DMII and recent dx of aortic insufficiency who is being seen today for the evaluation of SOB and NSTEMI.  History of Present Illness:   Lisa Poole is a  53yo F with a hx as stated above who initally presented to [facility] on 09/27/2018 with complaints of worsening exertional and resting SOB which has been progressive for the last several months. She denies chest pain or other anginal symptoms. She denies palpitations, LE swelling, abnormal weight gain or syncope. She reports orthopnea symptoms and mild dizziness. She has no personal or family hx of CAD. She has a remote hx of tobacco use and has risk factors that include DMII, HTN and HLD . She was previously seen in the ED on 07/30/2018 for similar symptoms of SOB and atypical chest pain. Troponin levels were negative and CTA was unremarkable. She was given ativan for anxiety and albuterol with symptoms improvement and she was discharged home. Her symptoms were thought to be related to bronchitis and/or anxiety. She was followed up with Dr. Claudie Leach with [facility] in which an echocardiogram was performed and she was told that her AI was stable and no other concerns were discussed. I am unable to find this record in Epic.   On ED arrival today, EKG showed NSR with RBBB and non-specific T wave abnormalities with no acute changes, similar to prior tracings. Troponin levels were found to be elevated at 0.05>0.07>0.09. She was placed on Hep gtt per EDP. CXR with no acute abnormality. Plan was for the patient to be transferred to [facility] for possible further ischemic evaluation.   Per chart review, care was previously obtained at [facility] in [place], [place]. It appears that she underwent a stress echocardiogram 05/30/2007 which showed no myocardial ischemia. Otherwise most of  her follow up care seems to be for osteoarthritis, DMII, and HTN.   Past Medical History:  Diagnosis Date  . Aortic insufficiency   . Diabetes mellitus without complication ([place])   . Hypercholesteremia   . Hypertension    Past Surgical History:  Procedure Laterality Date  . APPENDECTOMY    . ROTATOR CUFF REPAIR Bilateral 2017   . SINUS EXPLORATION    . TOTAL KNEE ARTHROPLASTY Left 12/10/2017   Procedure: LEFT TOTAL KNEE ARTHROPLASTY;  Surgeon: Sydnee Cabal, MD;  Location: [facility];  Service: Orthopedics;  Laterality: Left;  Adductor Block  . TUBAL LIGATION       Prior to Admission medications   Medication Sig Start Date End Date Taking? Authorizing Provider  aspirin 81 MG chewable tablet Chew 81 mg by mouth daily.   Yes [provider]  gabapentin (NEURONTIN) 300 MG capsule Take 300 mg by mouth 2 (two) times daily.   Yes [provider]  HYDROcodone-acetaminophen (NORCO) 10-325 MG tablet Take 1 tablet by mouth every 6 (six) hours as needed for moderate pain or severe pain.    Yes [provider]  INVOKAMET XR 50-500 MG TB24 Take 1 tablet by mouth 2 (two) times daily. 12/12/15  Yes [provider]  losartan-hydrochlorothiazide (HYZAAR) 100-12.5 MG tablet Take 1 tablet by mouth every morning. 12/23/15  Yes [provider]  lovastatin (MEVACOR) 10 MG tablet Take 10 mg by mouth at bedtime.    Yes [provider]  metoprolol (LOPRESSOR) 50 MG tablet Take 50 mg by mouth 2 (two) times daily.   Yes [provider]  VITAMIN D PO Take 1 tablet by mouth daily.   Yes [provider]  albuterol (PROVENTIL HFA;VENTOLIN HFA) 108 (90 Base) MCG/ACT inhaler Inhale 1-2 puffs into the lungs every 6 (six) hours as needed for wheezing or shortness of breath. Patient not taking: Reported on 09/27/2018 07/30/18   Drenda Freeze, MD  methocarbamol (ROBAXIN) 500 MG tablet Take 1 tablet (500 mg total) by mouth every 8 (eight) hours as needed for muscle spasms. Patient not taking: Reported on 07/30/2018 12/10/17   Wyatt Portela L, PA-C  oxyCODONE (ROXICODONE) 5 MG immediate release tablet Take 1 tablet (5 mg total) by mouth every 4 (four) hours as needed for breakthrough pain. Patient not taking: Reported on 07/30/2018 12/10/17 12/10/18  Lajean Manes, PA-C    Inpatient  Medications: Scheduled Meds:  Continuous Infusions: . heparin 750 Units/hr (09/27/18 1222)   PRN Meds: nitroGLYCERIN  Allergies:    Allergies  Allergen Reactions  . Ace Inhibitors Other (See Comments)    Flu like symptoms  . Benazepril Other (See Comments)    Flu like symptoms  . Lisinopril Other (See Comments)    Flu like symptoms  . Lotensin [Benazepril Hcl] Other (See Comments)    Flu like symptoms  . Sulfa Antibiotics Nausea And Vomiting    Social History:   Social History   Socioeconomic History  . Marital status: Married    Spouse name: Not on file  . Number of children: Not on file  . Years of education: Not on file  . Highest education level: Not on file  Occupational History  . Not on file  Social Needs  . Financial resource strain: Not on file  . Food insecurity:    Worry: Not on file    Inability: Not on file  . Transportation needs:    Medical: Not on file    Non-medical: Not on  file  Tobacco Use  . Smoking status: Never Smoker  . Smokeless tobacco: Never Used  Substance and Sexual Activity  . Alcohol use: No  . Drug use: No  . Sexual activity: Yes    Birth control/protection: Post-menopausal  Lifestyle  . Physical activity:    Days per week: Not on file    Minutes per session: Not on file  . Stress: Not on file  Relationships  . Social connections:    Talks on phone: Not on file    Gets together: Not on file    Attends religious service: Not on file    Active member of club or organization: Not on file    Attends meetings of clubs or organizations: Not on file    Relationship status: Not on file  . Intimate partner violence:    Fear of current or ex partner: Not on file    Emotionally abused: Not on file    Physically abused: Not on file    Forced sexual activity: Not on file  Other Topics Concern  . Not on file  Social History Narrative  . Not on file    Family History:   No family history on file. Family Status:  No family  status information on file.   ROS:  Please see the history of present illness.  All other ROS reviewed and negative.     Physical Exam/Data:   Vitals:   09/27/18 1100 09/27/18 1139 09/27/18 1146 09/27/18 1200  BP: 115/69   112/75  Pulse: 67   91  Resp: 13   (!) 27  Temp:      TempSrc:      SpO2: 100%   98%  Weight:  78.9 kg 78.9 kg   Height:  5' (1.524 m) 5' (1.524 m)    No intake or output data in the 24 hours ending 09/27/18 1239 Filed Weights   09/27/18 1139 09/27/18 1146  Weight: 78.9 kg 78.9 kg   Body mass index is 33.98 kg/m.   General:  Well nourished, well developed, in no acute distress HEENT: normal Lymph: no adenopathy Neck: no JVD Endocrine:  No thryomegaly Vascular: No carotid bruits; 4/4 extremity pulses 2+, without bruits  Cardiac:  normal S1, S2; RRR; no murmur  Lungs:  clear to auscultation bilaterally, no wheezing, rhonchi or rales  Abd: soft, nontender, no hepatomegaly  Ext: no edema Musculoskeletal:  No deformities, BUE and BLE strength normal and equal Skin: warm and dry  Neuro:  CNs 2-12 intact, no focal abnormalities noted Psych:  Normal affect   EKG:  The EKG was personally reviewed and demonstrates:  09/27/2018 NSR with RBBB and non-specific T wave abnormalities with no acute ischemic changes  Telemetry:  Telemetry was personally reviewed and demonstrates:  NSR  Relevant CV Studies:  Stress echocardiogram 05/2007:  RESTING DATA- The patient's baseline electrocardiogram showed normal sinus rhythm. The patients resting blood pressure was normal.  STRESS DATA- Patients target heart rate is 177 beats per minute. The patient exercised for 7 minutes using the Bruce protocol. The patient reached a maximum heart rate of 155 beats per minute. The patient achieved 88 % of maximum predicted heart rate. The METS achieved was 10.1. Exercise was stopped due to fatigue. The patient's blood pressure response to stress was normal. No significant  ST segment changes with exercise. No significant arrhythmias were seen. The patient did not have chest pain. No arrhythmias seen on electrocardiogram during recovery.  ELECTROCARDIOGRAPHIC SUMMARY- This was a  negative electrocardiogram for myocardial ischemia. The patient's fitness classification was above average. Typical angina was not provoked. No arrhythmias were noted.  Laboratory Data:  Chemistry Recent Labs  Lab 09/27/18 1005  NA 138  K 3.9  CL 105  CO2 24  GLUCOSE 125*  BUN 17  CREATININE 0.68  CALCIUM 9.5  GFRNONAA >60  GFRAA >60  ANIONGAP 9    Total Protein  Date Value Ref Range Status  06/04/2017 5.7 (L) 6.5 - 8.1 g/dL Final   Albumin  Date Value Ref Range Status  06/04/2017 3.6 3.5 - 5.0 g/dL Final   AST  Date Value Ref Range Status  06/04/2017 42 (H) 15 - 41 U/L Final   ALT  Date Value Ref Range Status  06/04/2017 21 14 - 54 U/L Final   Alkaline Phosphatase  Date Value Ref Range Status  06/04/2017 38 38 - 126 U/L Final   Total Bilirubin  Date Value Ref Range Status  06/04/2017 2.4 (H) 0.3 - 1.2 mg/dL Final   Hematology Recent Labs  Lab 09/27/18 1005  WBC 5.7  RBC 5.35*  HGB 14.7  HCT 45.8  MCV 85.6  MCH 27.5  MCHC 32.1  RDW 13.6  PLT 354   Cardiac Enzymes Recent Labs  Lab 09/27/18 1005  TROPONINI 0.09*   No results for input(s): TROPIPOC in the last 168 hours.  BNPNo results for input(s): BNP, PROBNP in the last 168 hours.  DDimer  Recent Labs  Lab 09/27/18 1016  DDIMER 0.47   TSH: No results found for: TSH Lipids:No results found for: CHOL, HDL, LDLCALC, LDLDIRECT, TRIG, CHOLHDL HgbA1c: Lab Results  Component Value Date   HGBA1C 6.4 (H) 12/07/2017   Radiology/Studies:  Dg Chest 2 View  Result Date: 09/27/2018 CLINICAL DATA:  Shortness of breath EXAM: CHEST - 2 VIEW COMPARISON:  07/30/2018 FINDINGS: Cardiac shadows within normal limits. The lungs are well aerated bilaterally. No focal infiltrate or sizable  effusion is seen. No acute bony abnormality is noted. IMPRESSION: No acute abnormality noted. Electronically Signed   By: Inez Catalina M.D.   On: 09/27/2018 11:00   Assessment and Plan:   1. NSTEMI: -Pt presented to with complaints of worsening exertional and resting SOB which has been progressive for the last several months. She denies chest pain or other anginal symptoms>>has reports of orthopnea symptoms and mild dizziness. She has no personal or family hx of CAD. She has a remote hx of tobacco use and has risk factors for CAD that include DMII, HTN and HLD  -Previously seen in the ED on 07/30/2018 for similar symptoms of SOB and atypical chest pain. Troponin levels were negative and CTA was unremarkable. She was given ativan for anxiety and albuterol with symptoms improvement and she was discharged home. Her symptoms were thought to be related to bronchitis and/or anxiety. She was followed up with Dr. Claudie Leach with [facility] in which an echocardiogram was performed and she was told that her AI was stable and no other concerns were discussed. I am unable to find this record in Epic.  -Troponin found to be elevated at 0.05>0.07>0.09 -EKG with NSR and RBBB without acute changes -CXR without acute cardiopulmonary changes -Hep gtt per pharmacy, ASA 324 given  -Will obtain echocardiogram given presenting symptoms to assess LV function and aortic valve -Further testing based on echo results. Likely will need further ischemic work up with coronary CT given stable creatinine and baseline HR in the mid 60's  -Would also obtain  CTA to r/o PE  -Continue ASA -On home losartan-HCTZ 100-12.5>>will hold in the setting of low normal BP -Continue metoprolol 50mg  BID, atorvastatin  -Will obtain more recent HbA1c and lipid panel for risk stratification -Continue to trend troponin, strict I&O -NPO for now   2. HTN: -Stable, 111/89>112/75>115/69 -Continue metoprolol 50 mg BID, hold home  losartan/HCTZ 100/12.5 in the setting of low normal BP -Creatinine stable   3. HLD: -No recent lab value>>will obtain lipid panel  -Continue statin given elevated risk for CAD  4. DM2: -Managed per PCP  -Last HbA1c, 6.4 on 12/07/2017 -Will obtain more recent lab value  -SSI for glucose control while inpatient status  5. Aortic insufficiency: -Per patient report, she has known AI dating back to 2009 -Most recent echocardiogram was performed after ED presentation and discharge for atypical chest pain>>pt was told that it was stable -Unable to find records -Obtain echocardiogram    For questions or updates, please contact [place] Please consult [email or web address] for contact info under Cardiology/STEMI.   SignedKathyrn Drown NP-C [place] Pager: [phone] 09/27/2018 12:39 PM

## 2018-09-27 NOTE — ED Triage Notes (Addendum)
Pt c/o SOB for past couple months. Reports that having it all the time now.

## 2018-09-27 NOTE — Progress Notes (Signed)
ANTICOAGULATION CONSULT NOTE - Initial Consult  Pharmacy Consult for heparin Indication: chest pain/ACS  Allergies  Allergen Reactions  . Ace Inhibitors Other (See Comments)    Flu like symptoms  . Benazepril Other (See Comments)    Flu like symptoms  . Lisinopril Other (See Comments)    Flu like symptoms  . Lotensin [Benazepril Hcl] Other (See Comments)    Flu like symptoms  . Sulfa Antibiotics Nausea And Vomiting    Patient Measurements: Height: 5' (152.4 cm) Weight: 174 lb (78.9 kg) IBW/kg (Calculated) : 45.5 Heparin Dosing Weight: 63 kg  Vital Signs: Temp: 98.1 F (36.7 C) (05/19 0959) Temp Source: Oral (05/19 0959) BP: 112/75 (05/19 1200) Pulse Rate: 91 (05/19 1200)  Labs: Recent Labs    09/27/18 1005 09/27/18 1016  HGB 14.7  --   HCT 45.8  --   PLT 354  --   APTT  --  26  LABPROT  --  13.4  INR  --  1.0  CREATININE 0.68  --   TROPONINI 0.09*  --     Estimated Creatinine Clearance: 74.7 mL/min (by C-G formula based on SCr of 0.68 mg/dL).   Medical History: Past Medical History:  Diagnosis Date  . Aortic insufficiency   . Diabetes mellitus without complication ([place])   . Hypercholesteremia   . Hypertension     Assessment: Pharmacy consulted to dose/monitor heparin drip for ACS in this 53 year old female presenting with SOB. Per med rec, patient was not taking anticoagulants PTA.   Baseline labs: -INR 1 -Hgb 14.7, Plt 354 -aPTT 26 -Troponin 0.09 -SCr 0.7  Goal of Therapy:  Heparin level 0.3-0.7 units/ml Monitor platelets by anticoagulation protocol: Yes   Plan:  -Give 2500 units bolus x 1 -Start heparin infusion at 750 units/hr (12 units/kg/hr) -Check anti-Xa level in 6 hours and daily while on heparin -Continue to monitor H&H and platelets -Monitor for signs/symptoms of bleeding  Lenis Noon, PharmD 09/27/2018,12:22 PM

## 2018-09-27 NOTE — ED Notes (Signed)
ED TO INPATIENT HANDOFF REPORT  ED Nurse Name and Phone #: Tana Coast Name/Age/Gender Lisa Poole 53 y.o. female Room/Bed: WA18/WA18  Code Status   Code Status: Prior  Home/SNF/Other Home Patient oriented to: self, place, time and situation Is this baseline? Yes   Triage Complete: Triage complete  Chief Complaint short of breath   Triage Note Pt c/o SOB for pas couple months. Reports that having it all the time now.    Allergies Allergies  Allergen Reactions  . Ace Inhibitors Other (See Comments)    Flu like symptoms  . Benazepril Other (See Comments)    Flu like symptoms  . Lisinopril Other (See Comments)    Flu like symptoms  . Lotensin [Benazepril Hcl] Other (See Comments)    Flu like symptoms  . Sulfa Antibiotics Nausea And Vomiting    Level of Care/Admitting Diagnosis ED Disposition    ED Disposition Condition Comment   Admit  Hospital Area: [facility] [[id number]]  Level of Care: Telemetry Cardiac [[id number]]  Covid Evaluation: Screening Protocol (No Symptoms)  Diagnosis: NSTEMI (non-ST elevated myocardial infarction) [facility]) [[id number]]  Admitting Physician: Burnell Blanks [[id number]]  Attending Physician: Lauree Chandler D [[id number]]  Estimated length of stay: past midnight tomorrow  Certification:: I certify this patient will need inpatient services for at least 2 midnights  PT Class (Do Not Modify): Inpatient [101]  PT Acc Code (Do Not Modify): Private [1]       B Medical/Surgery History Past Medical History:  Diagnosis Date  . Aortic insufficiency   . Diabetes mellitus without complication ([place])   . Hypercholesteremia   . Hypertension    Past Surgical History:  Procedure Laterality Date  . APPENDECTOMY    . ROTATOR CUFF REPAIR Bilateral 2017  . SINUS EXPLORATION    . TOTAL KNEE ARTHROPLASTY Left 12/10/2017   Procedure: LEFT TOTAL KNEE ARTHROPLASTY;  Surgeon: Sydnee Cabal, MD;  Location: [facility];  Service: Orthopedics;   Laterality: Left;  Adductor Block  . TUBAL LIGATION       A IV Location/Drains/Wounds Patient Lines/Drains/Airways Status   Active Line/Drains/Airways    Name:   Placement date:   Placement time:   Site:   Days:   Peripheral IV 09/27/18 Right Antecubital   09/27/18    1220    Antecubital   less than 1   Incision (Closed) 12/10/17 Knee Left   12/10/17    1617     291   Wound 10/15/12 Laceration Finger (Comment which one) Left   10/15/12    1702    Finger (Comment which one)   2173          Intake/Output Last 24 hours No intake or output data in the 24 hours ending 09/27/18 1242  Labs/Imaging Results for orders placed or performed during the hospital encounter of 09/27/18 (from the past 48 hour(s))  Troponin I - ONCE - STAT     Status: Abnormal   Collection Time: 09/27/18 10:05 AM  Result Value Ref Range   Troponin I 0.09 (HH) <0.03 ng/mL    Comment: CRITICAL RESULT CALLED TO, READ BACK BY AND VERIFIED WITH: K.SIMPSON RN AT 8841 ON 09/27/2018 BY S.VANHOORNE Performed at [facility], [place] [address], [place], [place] [address]   CBC with Differential     Status: Abnormal   Collection Time: 09/27/18 10:05 AM  Result Value Ref Range   WBC 5.7 4.0 - 10.5 K/uL   RBC 5.35 (H) 3.87 -  5.11 MIL/uL   Hemoglobin 14.7 12.0 - 15.0 g/dL   HCT 45.8 36.0 - 46.0 %   MCV 85.6 80.0 - 100.0 fL   MCH 27.5 26.0 - 34.0 pg   MCHC 32.1 30.0 - 36.0 g/dL   RDW 13.6 11.5 - 15.5 %   Platelets 354 150 - 400 K/uL   nRBC 0.0 0.0 - 0.2 %   Neutrophils Relative % 61 %   Neutro Abs 3.5 1.7 - 7.7 K/uL   Lymphocytes Relative 27 %   Lymphs Abs 1.5 0.7 - 4.0 K/uL   Monocytes Relative 8 %   Monocytes Absolute 0.5 0.1 - 1.0 K/uL   Eosinophils Relative 3 %   Eosinophils Absolute 0.2 0.0 - 0.5 K/uL   Basophils Relative 1 %   Basophils Absolute 0.0 0.0 - 0.1 K/uL   Immature Granulocytes 0 %   Abs Immature Granulocytes 0.01 0.00 - 0.07 K/uL    Comment: Performed at [facility], [place] [address], [place], [place] [address]  Basic metabolic panel     Status: Abnormal   Collection Time: 09/27/18 10:05 AM  Result Value Ref Range   Sodium 138 135 - 145 mmol/L   Potassium 3.9 3.5 - 5.1 mmol/L   Chloride 105 98 - 111 mmol/L   CO2 24 22 - 32 mmol/L   Glucose, Bld 125 (H) 70 - 99 mg/dL   BUN 17 6 - 20 mg/dL   Creatinine, Ser 0.68 0.44 - 1.00 mg/dL   Calcium 9.5 8.9 - 10.3 mg/dL   GFR calc non Af Amer >60 >60 mL/min   GFR calc Af Amer >60 >60 mL/min   Anion gap 9 5 - 15    Comment: Performed at [facility], [place] [address] [place], [place] [address]  D-dimer, quantitative (not at [facility])     Status: None   Collection Time: 09/27/18 10:16 AM  Result Value Ref Range   D-Dimer, Quant 0.47 0.00 - 0.50 ug/mL-FEU    Comment: (NOTE) At the manufacturer cut-off of 0.50 ug/mL FEU, this assay has been documented to exclude PE with a sensitivity and negative predictive value of 97 to 99%.  At this time, this assay has not been approved by the FDA to exclude DVT/VTE. Results should be correlated with clinical presentation. Performed at [facility], [place] [address], [place], [place] [address]   Protime-INR     Status: None   Collection Time: 09/27/18 10:16 AM  Result Value Ref Range   Prothrombin Time 13.4 11.4 - 15.2 seconds   INR 1.0 0.8 - 1.2    Comment: (NOTE) INR goal varies based on device and disease states. Performed at [facility], [place] [address], [place], [place] [address]   APTT     Status: None   Collection Time: 09/27/18 10:16 AM  Result Value Ref Range   aPTT 26 24 - 36 seconds    Comment: Performed at [facility], [place] [address] [place], [place] [address]   Dg Chest 2 View  Result Date: 09/27/2018 CLINICAL DATA:  Shortness of breath EXAM: CHEST - 2 VIEW COMPARISON:  07/30/2018 FINDINGS: Cardiac shadows within normal limits. The lungs are well aerated bilaterally. No  focal infiltrate or sizable effusion is seen. No acute bony abnormality is noted. IMPRESSION: No acute abnormality noted. Electronically Signed   By: Inez Catalina M.D.   On: 09/27/2018 11:00    Pending Labs Unresulted Labs (From admission, onward)  Start     Ordered   09/28/18 0500  CBC  Daily,   R     09/27/18 1226   09/27/18 1830  Heparin level (unfractionated)  Once-Timed,   R     09/27/18 1226   09/27/18 1135  SARS Coronavirus 2 (CEPHEID - Performed in [place] hospital lab), Hosp Order  (Asymptomatic Patients Labs)  Once,   R    Question:  Rule Out  Answer:  Yes   09/27/18 1134          Vitals/Pain Today's Vitals   09/27/18 1100 09/27/18 1139 09/27/18 1146 09/27/18 1200  BP: 115/69   112/75  Pulse: 67   91  Resp: 13   (!) 27  Temp:      TempSrc:      SpO2: 100%   98%  Weight:  78.9 kg 78.9 kg   Height:  5' (1.524 m) 5' (1.524 m)   PainSc:        Isolation Precautions No active isolations  Medications Medications  nitroGLYCERIN (NITROSTAT) SL tablet 0.4 mg (0.4 mg Sublingual Given 09/27/18 1139)  heparin ADULT infusion 100 units/mL (25000 units/225mL sodium chloride 0.45%) (750 Units/hr Intravenous New Bag/Given 09/27/18 1222)  aspirin chewable tablet 324 mg (324 mg Oral Given 09/27/18 1139)  heparin bolus via infusion 2,500 Units (2,500 Units Intravenous Bolus from Bag 09/27/18 1221)    Mobility walks Low fall risk   Focused Assessments Elevated Troponin   R Recommendations: See Admitting Provider Note  Report given to:   Additional Notes: .

## 2018-09-28 ENCOUNTER — Inpatient Hospital Stay (HOSPITAL_COMMUNITY): Payer: Medicare Other

## 2018-09-28 ENCOUNTER — Encounter (HOSPITAL_COMMUNITY)
Admission: EM | Disposition: A | Discharge status: Home / Self Care | Payer: Self-pay | Source: Home / Self Care | Attending: Cardiovascular Disease

## 2018-09-28 ENCOUNTER — Encounter (HOSPITAL_COMMUNITY): Payer: Self-pay | Admitting: Cardiovascular Disease

## 2018-09-28 DIAGNOSIS — I361 Nonrheumatic tricuspid (valve) insufficiency: Secondary | ICD-10-CM

## 2018-09-28 DIAGNOSIS — I351 Nonrheumatic aortic (valve) insufficiency: Secondary | ICD-10-CM

## 2018-09-28 DIAGNOSIS — R7989 Other specified abnormal findings of blood chemistry: Secondary | ICD-10-CM

## 2018-09-28 DIAGNOSIS — R778 Other specified abnormalities of plasma proteins: Secondary | ICD-10-CM

## 2018-09-28 DIAGNOSIS — R0789 Other chest pain: Secondary | ICD-10-CM

## 2018-09-28 HISTORY — PX: LEFT HEART CATH AND CORONARY ANGIOGRAPHY: CATH118249

## 2018-09-28 LAB — GLUCOSE, CAPILLARY
Glucose-Capillary: 127 mg/dL — ABNORMAL HIGH (ref 70–99)
Glucose-Capillary: 105 mg/dL — ABNORMAL HIGH (ref 70–99)
Glucose-Capillary: 170 mg/dL — ABNORMAL HIGH (ref 70–99)
Glucose-Capillary: 143 mg/dL — ABNORMAL HIGH (ref 70–99)

## 2018-09-28 LAB — CREATININE, SERUM
Creatinine, Ser: 0.7 mg/dL (ref 0.44–1.00)
GFR calc Af Amer: >60 mL/min
GFR calc non Af Amer: >60 mL/min

## 2018-09-28 LAB — CBC
HCT: 42.6 % (ref 36.0–46.0)
Hemoglobin: 13.8 g/dL (ref 12.0–15.0)
MCH: 27 pg (ref 26.0–34.0)
MCHC: 32.4 g/dL (ref 30.0–36.0)
MCV: 83.4 fL (ref 80.0–100.0)
Platelets: 305 10*3/uL (ref 150–400)
RBC: 5.11 MIL/uL (ref 3.87–5.11)
RDW: 13.7 % (ref 11.5–15.5)
WBC: 6.3 10*3/uL (ref 4.0–10.5)
nRBC: 0 % (ref 0.0–0.2)

## 2018-09-28 LAB — LIPID PANEL
Cholesterol: 178 mg/dL (ref 0–200)
HDL: 71 mg/dL (ref >40)
LDL Cholesterol: 89 mg/dL (ref 0–99)
Total CHOL/HDL Ratio: 2.5 RATIO
Triglycerides: 91 mg/dL (ref <150)
VLDL: 18 mg/dL (ref 0–40)

## 2018-09-28 LAB — TROPONIN I: Troponin I: 0.15 ng/mL (ref <0.03)

## 2018-09-28 LAB — ECHOCARDIOGRAM COMPLETE
Height: 60 in
Weight: 2816 oz

## 2018-09-28 LAB — HEPARIN LEVEL (UNFRACTIONATED)
Heparin Unfractionated: <0.1 IU/mL — ABNORMAL LOW (ref 0.30–0.70)
Heparin Unfractionated: 0.5 IU/mL (ref 0.30–0.70)

## 2018-09-28 LAB — HIV ANTIBODY (ROUTINE TESTING W REFLEX): HIV Screen 4th Generation wRfx: NONREACTIVE

## 2018-09-28 SURGERY — LEFT HEART CATH AND CORONARY ANGIOGRAPHY
Anesthesia: LOCAL

## 2018-09-28 MED ORDER — ONDANSETRON HCL 4 MG/2ML IJ SOLN: 4.0000 mg | Freq: Four times a day (QID) | INTRAMUSCULAR | Status: DC | PRN

## 2018-09-28 MED ORDER — IOHEXOL 350 MG/ML SOLN
INTRAVENOUS | Status: DC | PRN
  Administered 2018-09-28: 80 mL via INTRA_ARTERIAL

## 2018-09-28 MED ORDER — METOPROLOL TARTRATE 25 MG PO TABS
25.0000 mg | ORAL_TABLET | Freq: Two times a day (BID) | ORAL | Status: DC
  Administered 2018-09-28: 15:00:00 25 mg via ORAL
  Filled 2018-09-28: qty 1

## 2018-09-28 MED ORDER — SODIUM CHLORIDE 0.9% FLUSH: 3.0000 mL | Freq: Two times a day (BID) | INTRAVENOUS | Status: DC

## 2018-09-28 MED ORDER — MIDAZOLAM HCL 2 MG/2ML IJ SOLN
INTRAMUSCULAR | Status: AC
  Filled 2018-09-28: qty 2

## 2018-09-28 MED ORDER — FENTANYL CITRATE (PF) 100 MCG/2ML IJ SOLN
INTRAMUSCULAR | Status: AC
  Filled 2018-09-28: qty 2

## 2018-09-28 MED ORDER — FENTANYL CITRATE (PF) 100 MCG/2ML IJ SOLN
INTRAMUSCULAR | Status: DC | PRN
  Administered 2018-09-28: 25 ug via INTRAVENOUS
  Administered 2018-09-28: 50 ug via INTRAVENOUS

## 2018-09-28 MED ORDER — LABETALOL HCL 5 MG/ML IV SOLN: 10.0000 mg | INTRAVENOUS | Status: AC | PRN

## 2018-09-28 MED ORDER — MIDAZOLAM HCL 2 MG/2ML IJ SOLN
INTRAMUSCULAR | Status: DC | PRN
  Administered 2018-09-28: 2 mg via INTRAVENOUS

## 2018-09-28 MED ORDER — SODIUM CHLORIDE 0.9% FLUSH: 3.0000 mL | INTRAVENOUS | Status: DC | PRN

## 2018-09-28 MED ORDER — LOSARTAN POTASSIUM 25 MG PO TABS
12.5000 mg | ORAL_TABLET | Freq: Every day | ORAL | Status: DC
  Administered 2018-09-28: 12.5 mg via ORAL
  Filled 2018-09-28: qty 1

## 2018-09-28 MED ORDER — METOPROLOL TARTRATE 50 MG PO TABS: 25.0000 mg | ORAL_TABLET | Freq: Two times a day (BID) | ORAL | Status: DC

## 2018-09-28 MED ORDER — METOPROLOL TARTRATE 25 MG PO TABS: 25.0000 mg | ORAL_TABLET | Freq: Two times a day (BID) | ORAL | Status: DC

## 2018-09-28 MED ORDER — HEPARIN (PORCINE) IN NACL 1000-0.9 UT/500ML-% IV SOLN
INTRAVENOUS | Status: DC | PRN
  Administered 2018-09-28 (×2): 500 mL

## 2018-09-28 MED ORDER — HEPARIN SODIUM (PORCINE) 5000 UNIT/ML IJ SOLN: 5000.0000 [IU] | Freq: Three times a day (TID) | INTRAMUSCULAR | Status: DC

## 2018-09-28 MED ORDER — SODIUM CHLORIDE 0.9 % IV SOLN: INTRAVENOUS | Status: AC

## 2018-09-28 MED ORDER — HYDRALAZINE HCL 20 MG/ML IJ SOLN: 10.0000 mg | INTRAMUSCULAR | Status: AC | PRN

## 2018-09-28 MED ORDER — LIDOCAINE HCL (PF) 1 % IJ SOLN
INTRAMUSCULAR | Status: DC | PRN
  Administered 2018-09-28: 10 mL

## 2018-09-28 MED ORDER — HEPARIN (PORCINE) IN NACL 1000-0.9 UT/500ML-% IV SOLN
INTRAVENOUS | Status: AC
  Filled 2018-09-28: qty 1000

## 2018-09-28 MED ORDER — ACETAMINOPHEN 325 MG PO TABS: 650.0000 mg | ORAL_TABLET | ORAL | Status: DC | PRN

## 2018-09-28 MED ORDER — LOSARTAN POTASSIUM 25 MG PO TABS: 12.5000 mg | ORAL_TABLET | Freq: Every day | ORAL | 1 refills | Status: DC

## 2018-09-28 MED ORDER — LIDOCAINE HCL (PF) 1 % IJ SOLN
INTRAMUSCULAR | Status: AC
  Filled 2018-09-28: qty 30

## 2018-09-28 MED ORDER — SODIUM CHLORIDE 0.9 % IV SOLN: 250.0000 mL | INTRAVENOUS | Status: DC | PRN

## 2018-09-28 MED FILL — LOSARTAN POTASSIUM 25 MG TA: 25 | 30 days supply | Qty: 15 | Fill #0 | Status: TO

## 2018-09-28 SURGICAL SUPPLY — 10 items
CATH INFINITI 5FR MULTPACK ANG (CATHETERS) ×2 IMPLANT
COVER DOME SNAP 22 D (MISCELLANEOUS) ×2 IMPLANT
KIT HEART LEFT (KITS) ×2 IMPLANT
PACK CARDIAC CATHETERIZATION (CUSTOM PROCEDURE TRAY) ×2 IMPLANT
SHEATH PINNACLE 5F 10CM (SHEATH) ×2 IMPLANT
SHEATH PROBE COVER 6X72 (BAG) ×2 IMPLANT
SYR MEDRAD MARK 7 150ML (SYRINGE) ×2 IMPLANT
TRANSDUCER W/STOPCOCK (MISCELLANEOUS) ×2 IMPLANT
TUBING CIL FLEX 10 FLL-RA (TUBING) ×2 IMPLANT
WIRE EMERALD 3MM-J .035X150CM (WIRE) ×2 IMPLANT

## 2018-09-28 NOTE — Progress Notes (Signed)
  Echocardiogram 2D Echocardiogram has been performed.  Lisa Poole 09/28/2018, 2:48 PM

## 2018-09-28 NOTE — Discharge Summary (Addendum)
The patient has been seen in conjunction with Lisa Bellis, NP. All aspects of care have been considered and discussed. The patient has been personally interviewed, examined, and all clinical data has been reviewed.   Please see my earlier note.  Plan to wean and DC beta blocker. Continue ARB. May need amlodipine if BP increases.  Changes in therapy are to accomodate for the possibility of microvascular dysfunction.   Discharge Summary    Patient ID: Lisa Poole,  MRN: [id number], DOB/AGE: 54-Dec-1967 54 y.o.  Admit date: 09/27/2018 Discharge date: 09/28/2018  Primary Care Provider: [place] Cage Primary Cardiologist: Dr. Tamala Julian  Discharge Diagnoses    Active Problems:   NSTEMI (non-ST elevated myocardial infarction) [facility])   Elevated troponin level   Chest discomfort   Allergies Allergies  Allergen Reactions  . Ace Inhibitors Other (See Comments)    Flu like symptoms  . Benazepril Other (See Comments)    Flu like symptoms  . Lisinopril Other (See Comments)    Flu like symptoms  . Lotensin [Benazepril Hcl] Other (See Comments)    Flu like symptoms  . Sulfa Antibiotics Nausea And Vomiting    Diagnostic Studies/Procedures    Cath: 09/28/18   Prox LAD to Mid LAD lesion is 5% stenosed.  The left ventricular ejection fraction is 50-55% by visual estimate.  The left ventricular systolic function is normal.   Normal global LV contractility without focal segmental wall motion abnormalities.  Estimated EF 50 to 55%.  LVEDP 12 mmHg.  No significant coronary obstructive disease.  There is minimal systolic bridging of 5 to 10% in the mid LAD.  The left circumflex vessel is normal.  The RCA is a dominant vessel with a superior takeoff and is normal.  RECOMMENDATION: Optimal medical therapy for the patient with a history of hypertension, hyperlipidemia, and diabetes mellitus.  Ideal blood pressure less than 120/80 with stage I hypertension beginning at 130/80.   The patient is scheduled to undergo an echo Doppler study for further evaluation of reported aortic insufficiency.  TTE: 09/28/18  IMPRESSIONS    1. The left ventricle has normal systolic function with an ejection fraction of 60-65%. The cavity size was normal. There is mild concentric left ventricular hypertrophy. Left ventricular diastolic Doppler parameters are consistent with  pseudonormalization. Indeterminate filling pressures.  2. The right ventricle has normal systolic function. The cavity was normal. There is no increase in right ventricular wall thickness.  3. No evidence of mitral valve stenosis.  4. The aortic valve is tricuspid. Mild thickening of the aortic valve. Mild calcification of the aortic valve. Aortic valve regurgitation is mild to moderate by color flow Doppler. No stenosis of the aortic valve.  5. The ascending aorta is normal in size and structure. _____________   History of Present Illness     53 y.o. female with a hx of HTN, HLD, DMII and recent dx of aortic insufficiency who was seen for the evaluation of SOB and NSTEMI.  Lisa Poole is a 53yo F with a hx as stated above who initally presented to [facility] on 09/27/2018 with complaints of worsening exertional and resting SOB which has been progressive for the last several months. She denied chest pain or other anginal symptoms. She denied palpitations, LE swelling, abnormal weight gain or syncope. She reported orthopnea symptoms and mild dizziness. She has no personal or family hx of CAD. She has a remote hx of tobacco use and had risk factors that included DMII,  HTN and HLD . She was previously seen in the ED on 07/30/2018 for similar symptoms of SOB and atypical chest pain. Troponin levels were negative and CTA was unremarkable. She was given ativan for anxiety and albuterol with symptoms improvement and she was discharged home. Her symptoms were thought to be related to bronchitis and/or anxiety. She was followed up with  Dr. Claudie Leach with [facility] in which an echocardiogram was performed and she was told that her AI was stable and no other concerns were discussed. Unable to find this record in Epic.   On ED arrival, EKG showed NSR with RBBB and non-specific T wave abnormalities with no acute changes, similar to prior tracings. Troponin levels were found to be elevated at 0.05>0.07>0.09. She was placed on Hep gtt per EDP. CXR with no acute abnormality. Plan was for the patient to be transferred to [facility] for possible further ischemic evaluation.   Per chart review, care was previously obtained at [facility] in [place], [place]. It appearred that she underwent a stress echocardiogram 05/30/2007 which showed no myocardial ischemia. Otherwise most of her follow up care seems to be for osteoarthritis, DMII, and HTN.   Given symptoms and risk factors, she was admitted with plans to undergo cardiac cath. On admission her ARB/HCTZ combo was held 2/2 to hypotension and continued on BB therapy.   Hospital Course     Consultants: none  Troponin peaked at 0.15. Underwent cardiac cath noted above with no significant CAD, normal LV function without segmental WMA. EF estimated at 50-55%. LVEDP 78mmHg. LDL noted at 89. Follow up echo showed normal EF with no significant valvular dysfunction. Decision was made to wean from her metoprolol over the next several weeks and restart losartan at lower dose at 12.5mg  daily without HCTZ. Right groin site stable and able to ambulate prior to discharge.    Lisa Poole was seen by Dr. Tamala Julian and determined stable for discharge home. Follow up in the office has been arranged. Medications are listed below.   _____________  Discharge Vitals Blood pressure 106/68, pulse 65, temperature 98.6 F (37 C), temperature source Oral, resp. rate 18, height 5' (1.524 m), weight 79.8 kg, last menstrual period 07/25/2015, SpO2 99 %.  Filed Weights   09/27/18 1146 09/27/18 1435 09/28/18  0539  Weight: 78.9 kg 79.8 kg 79.8 kg    Labs & Radiologic Studies    CBC Recent Labs    09/27/18 1005 09/28/18 0228  WBC 5.7 6.3  NEUTROABS 3.5  --   HGB 14.7 13.8  HCT 45.8 42.6  MCV 85.6 83.4  PLT 354 161   Basic Metabolic Panel Recent Labs    09/27/18 1005 09/28/18 0616  NA 138  --   K 3.9  --   CL 105  --   CO2 24  --   GLUCOSE 125*  --   BUN 17  --   CREATININE 0.68 0.70  CALCIUM 9.5  --    Liver Function Tests No results for input(s): AST, ALT, ALKPHOS, BILITOT, PROT, ALBUMIN in the last 72 hours. No results for input(s): LIPASE, AMYLASE in the last 72 hours. Cardiac Enzymes Recent Labs    09/27/18 1431 09/27/18 2001 09/28/18 0228  TROPONINI 0.10* 0.12* 0.15*   BNP Invalid input(s): POCBNP D-Dimer Recent Labs    09/27/18 1016  DDIMER 0.47   Hemoglobin A1C Recent Labs    09/27/18 1431  HGBA1C 6.2*   Fasting Lipid Panel Recent Labs    09/28/18  0228  CHOL 178  HDL 71  LDLCALC 89  TRIG 91  CHOLHDL 2.5   Thyroid Function Tests No results for input(s): TSH, T4TOTAL, T3FREE, THYROIDAB in the last 72 hours.  Invalid input(s): FREET3 _____________  Dg Chest 2 View  Result Date: 09/27/2018 CLINICAL DATA:  Shortness of breath EXAM: CHEST - 2 VIEW COMPARISON:  07/30/2018 FINDINGS: Cardiac shadows within normal limits. The lungs are well aerated bilaterally. No focal infiltrate or sizable effusion is seen. No acute bony abnormality is noted. IMPRESSION: No acute abnormality noted. Electronically Signed   By: Inez Catalina M.D.   On: 09/27/2018 11:00   Disposition   Pt is being discharged home today in good condition.  Follow-up Plans & Appointments    Follow-up Information    Franchot Mimes, MD Follow up.   Specialty:  Family Medicine Why:  appt within 2-3 weeks Contact information: [address] [facility] [place] [address] [phone]          Discharge Instructions    Call MD for:  redness, tenderness,  or signs of infection (pain, swelling, redness, odor or green/yellow discharge around incision site)   Complete by:  As directed    Diet - low sodium heart healthy   Complete by:  As directed    Discharge instructions   Complete by:  As directed    Groin Site Care Refer to this sheet in the next few weeks. These instructions provide you with information on caring for yourself after your procedure. Your caregiver may also give you more specific instructions. Your treatment has been planned according to current medical practices, but problems sometimes occur. Call your caregiver if you have any problems or questions after your procedure. HOME CARE INSTRUCTIONS You may shower 24 hours after the procedure. Remove the bandage (dressing) and gently wash the site with plain soap and water. Gently pat the site dry.  Do not apply powder or lotion to the site.  Do not sit in a bathtub, swimming pool, or whirlpool for 5 to 7 days.  No bending, squatting, or lifting anything over 10 pounds (4.5 kg) as directed by your caregiver.  Inspect the site at least twice daily.  Do not drive home if you are discharged the same day of the procedure. Have someone else drive you.  You may drive 24 hours after the procedure unless otherwise instructed by your caregiver.  What to expect: Any bruising will usually fade within 1 to 2 weeks.  Blood that collects in the tissue (hematoma) may be painful to the touch. It should usually decrease in size and tenderness within 1 to 2 weeks.  SEEK IMMEDIATE MEDICAL CARE IF: You have unusual pain at the groin site or down the affected leg.  You have redness, warmth, swelling, or pain at the groin site.  You have drainage (other than a small amount of blood on the dressing).  You have chills.  You have a fever or persistent symptoms for more than 72 hours.  You have a fever and your symptoms suddenly get worse.  Your leg becomes pale, cool, tingly, or numb.  You have heavy  bleeding from the site. Hold pressure on the site. .  Please slowly reduce your metoprolol dose as instructed by MD. We have also reduced your losartan dose to 12.5mg  daily. You will need to stop your losartan/HCTZ combination pill.   Increase activity slowly   Complete by:  As directed  Discharge Medications     Medication List    STOP taking these medications   losartan-hydrochlorothiazide 100-12.5 MG tablet Commonly known as:  HYZAAR   oxyCODONE 5 MG immediate release tablet Commonly known as:  Roxicodone     TAKE these medications   albuterol 108 (90 Base) MCG/ACT inhaler Commonly known as:  VENTOLIN HFA Inhale 1-2 puffs into the lungs every 6 (six) hours as needed for wheezing or shortness of breath.   aspirin 81 MG chewable tablet Chew 81 mg by mouth daily.   gabapentin 300 MG capsule Commonly known as:  NEURONTIN Take 300 mg by mouth 2 (two) times daily.   HYDROcodone-acetaminophen 10-325 MG tablet Commonly known as:  NORCO Take 1 tablet by mouth every 6 (six) hours as needed for moderate pain or severe pain.   Invokamet XR 50-500 MG Tb24 Generic drug:  Canagliflozin-metFORMIN HCl ER Take 1 tablet by mouth 2 (two) times daily.   losartan 25 MG tablet Commonly known as:  COZAAR Take 0.5 tablets (12.5 mg total) by mouth daily. Start taking on:  Sep 29, 2018   lovastatin 10 MG tablet Commonly known as:  MEVACOR Take 10 mg by mouth at bedtime.   methocarbamol 500 MG tablet Commonly known as:  Robaxin Take 1 tablet (500 mg total) by mouth every 8 (eight) hours as needed for muscle spasms.   metoprolol tartrate 50 MG tablet Commonly known as:  LOPRESSOR Take 0.5 tablets (25 mg total) by mouth 2 (two) times daily. What changed:  how much to take   VITAMIN D PO Take 1 tablet by mouth daily.        Acute coronary syndrome (MI, NSTEMI, STEMI, etc) this admission?:  No.  The elevated Troponin was due to the acute medical illness or demand ischemia.      Outstanding Labs/Studies   N/a  Duration of Discharge Encounter   Greater than 30 minutes including physician time.  Signed, Lisa Bellis NP-C 09/28/2018, 5:05 PM

## 2018-09-28 NOTE — Progress Notes (Addendum)
Progress Note  Patient Name: Lisa Poole Date of Encounter: 09/28/2018  Primary Cardiologist: No primary care provider on file.   Subjective   No chest pain overnight.  Inpatient Medications    Scheduled Meds: . [MAR Hold] aspirin EC  81 mg Oral Daily  . [MAR Hold] atorvastatin  40 mg Oral q1800  . [MAR Hold] metoprolol tartrate  50 mg Oral BID  . [MAR Hold] sodium chloride flush  3 mL Intravenous Q12H   Continuous Infusions: . sodium chloride    . sodium chloride 1 mL/kg/hr (09/28/18 0521)  . heparin Stopped (09/28/18 0757)   PRN Meds: sodium chloride, [MAR Hold] acetaminophen, [MAR Hold] HYDROcodone-acetaminophen, [MAR Hold] nitroGLYCERIN, [MAR Hold] ondansetron (ZOFRAN) IV, sodium chloride flush   Vital Signs    Vitals:   09/27/18 2118 09/27/18 2130 09/28/18 0524 09/28/18 0539  BP: 113/72 112/78 (!) 110/58   Pulse: (!) 57 63 (!) 59   Resp:      Temp:      TempSrc:      SpO2: 100%  99%   Weight:    79.8 kg  Height:        Intake/Output Summary (Last 24 hours) at 09/28/2018 0807 Last data filed at 09/28/2018 0600 Gross per 24 hour  Intake 647.39 ml  Output -  Net 647.39 ml   Last 3 Weights 09/28/2018 09/27/2018 09/27/2018  Weight (lbs) 176 lb 175 lb 14.4 oz 174 lb  Weight (kg) 79.833 kg 79.788 kg 78.926 kg      Telemetry    NSR - Personally Reviewed  ECG    SB and RBBB with poor RWP - Personally Reviewed  Physical Exam  Stable GEN: No acute distress.   Neck: No JVD Cardiac: RRR, no murmurs, rubs, or gallops. Decreased radial pulses. Femoral cath site okay. Respiratory: Clear to auscultation bilaterally. GI: Soft, nontender, non-distended  MS: No edema; No deformity. Neuro:  Nonfocal  Psych: Normal affect   Labs    Chemistry Recent Labs  Lab 09/27/18 1005  NA 138  K 3.9  CL 105  CO2 24  GLUCOSE 125*  BUN 17  CREATININE 0.68  CALCIUM 9.5  GFRNONAA >60  GFRAA >60  ANIONGAP 9     Hematology Recent Labs  Lab 09/27/18 1005  09/28/18 0228  WBC 5.7 6.3  RBC 5.35* 5.11  HGB 14.7 13.8  HCT 45.8 42.6  MCV 85.6 83.4  MCH 27.5 27.0  MCHC 32.1 32.4  RDW 13.6 13.7  PLT 354 305    Cardiac Enzymes Recent Labs  Lab 09/27/18 1005 09/27/18 1431 09/27/18 2001 09/28/18 0228  TROPONINI 0.09* 0.10* 0.12* 0.15*   No results for input(s): TROPIPOC in the last 168 hours.   BNP Recent Labs  Lab 09/27/18 1431  BNP 36.2     DDimer  Recent Labs  Lab 09/27/18 1016  DDIMER 0.47     Radiology    Dg Chest 2 View  Result Date: 09/27/2018 CLINICAL DATA:  Shortness of breath EXAM: CHEST - 2 VIEW COMPARISON:  07/30/2018 FINDINGS: Cardiac shadows within normal limits. The lungs are well aerated bilaterally. No focal infiltrate or sizable effusion is seen. No acute bony abnormality is noted. IMPRESSION: No acute abnormality noted. Electronically Signed   By: Inez Catalina M.D.   On: 09/27/2018 11:00    Cardiac Studies   ECHOCARDIOGRAM: not yet performed.There is a question of Aortic regurgitation CARDIAC CATH 09/28/18: Cath demonstrated absence of focal disease. Vessel caliber is generally smaller  than normal. RCA arises anterior from the right sinus.   Patient Profile     53 y.o. female with a hx of HTN, HLD, DMII and recent dx of aortic insufficiency who is being seen today for the evaluation of SOB and NSTEMI.  Assessment & Plan    1. NSTEMI: Slow upward trending TI. No obvious dissection, WMA, or embolus to account for discomfort or TI elevation. Could have microvascular disease/MINOCA. And/or INOCA. Recommend we wean bb off. Start ARB since diabetic and observational data suggest benefit in Minoca/Inoca  Syndromes. 2. Probably home late today if groin okay. 3. Aortic regurgitation: Needs echo today to assess AV and LV size. 4. Hyperlipidemia: Achieve preventive target 5. DM II: Achieve Hgb A1C < 7  Likely home later today after echo if no surprises.  For questions or updates, please contact [place] Please consult [email or web address] for contact info under        Signed, Sinclair Grooms, MD  09/28/2018, 8:07 AM

## 2018-09-28 NOTE — Discharge Instructions (Signed)
Femoral Site Care °This sheet gives you information about how to care for yourself after your procedure. Your health care provider may also give you more specific instructions. If you have problems or questions, contact your health care provider. °What can I expect after the procedure? °After the procedure, it is common to have: °· Bruising that usually fades within 1-2 weeks. °· Tenderness at the site. °Follow these instructions at home: °Wound care °· Follow instructions from your health care provider about how to take care of your insertion site. Make sure you: °? Wash your hands with soap and water before you change your bandage (dressing). If soap and water are not available, use hand sanitizer. °? Change your dressing as told by your health care provider. °? Leave stitches (sutures), skin glue, or adhesive strips in place. These skin closures may need to stay in place for 2 weeks or longer. If adhesive strip edges start to loosen and curl up, you may trim the loose edges. Do not remove adhesive strips completely unless your health care provider tells you to do that. °· Do not take baths, swim, or use a hot tub until your health care provider approves. °· You may shower 24-48 hours after the procedure or as told by your health care provider. °? Gently wash the site with plain soap and water. °? Pat the area dry with a clean towel. °? Do not rub the site. This may cause bleeding. °· Do not apply powder or lotion to the site. Keep the site clean and dry. °· Check your femoral site every day for signs of infection. Check for: °? Redness, swelling, or pain. °? Fluid or blood. °? Warmth. °? Pus or a bad smell. °Activity °· For the first 2-3 days after your procedure, or as long as directed: °? Avoid climbing stairs as much as possible. °? Do not squat. °· Do not lift anything that is heavier than 10 lb (4.5 kg), or the limit that you are told, until your health care provider says that it is safe. °· Rest as  directed. °? Avoid sitting for a long time without moving. Get up to take short walks every 1-2 hours. °· Do not drive for 24 hours if you were given a medicine to help you relax (sedative). °General instructions °· Take over-the-counter and prescription medicines only as told by your health care provider. °· Keep all follow-up visits as told by your health care provider. This is important. °Contact a health care provider if you have: °· A fever or chills. °· You have redness, swelling, or pain around your insertion site. °Get help right away if: °· The catheter insertion area swells very fast. °· You pass out. °· You suddenly start to sweat or your skin gets clammy. °· The catheter insertion area is bleeding, and the bleeding does not stop when you hold steady pressure on the area. °· The area near or just beyond the catheter insertion site becomes pale, cool, tingly, or numb. °These symptoms may represent a serious problem that is an emergency. Do not wait to see if the symptoms will go away. Get medical help right away. Call your local emergency services (911 in the [place] Do not drive yourself to the hospital. °Summary °· After the procedure, it is common to have bruising that usually fades within 1-2 weeks. °· Check your femoral site every day for signs of infection. °· Do not lift anything that is heavier than 10 lb (4.5 kg), or the   limit that you are told, until your health care provider says that it is safe. This information is not intended to replace advice given to you by your health care provider. Make sure you discuss any questions you have with your health care provider. Document Released: 12/29/2013 Document Revised: 05/10/2017 Document Reviewed: 05/10/2017 Elsevier Interactive Patient Education  2019 [employer].    Heart-Healthy Eating Plan Heart-healthy meal planning includes:  Eating less unhealthy fats.  Eating more healthy fats.  Making other changes in your diet. Talk with your  doctor or a diet specialist (dietitian) to create an eating plan that is right for you. What is my plan? Your doctor may recommend an eating plan that includes:  Total fat: ______% or less of total calories a day.  Saturated fat: ______% or less of total calories a day.  Cholesterol: less than _________mg a day. What are tips for following this plan? Cooking Avoid frying your food. Try to bake, boil, grill, or broil it instead. You can also reduce fat by:  Removing the skin from poultry.  Removing all visible fats from meats.  Steaming vegetables in water or broth. Meal planning   At meals, divide your plate into four equal parts: ? Fill one-half of your plate with vegetables and green salads. ? Fill one-fourth of your plate with whole grains. ? Fill one-fourth of your plate with lean protein foods.  Eat 4-5 servings of vegetables per day. A serving of vegetables is: ? 1 cup of raw or cooked vegetables. ? 2 cups of raw leafy greens.  Eat 4-5 servings of fruit per day. A serving of fruit is: ? 1 medium whole fruit. ?  cup of dried fruit. ?  cup of fresh, frozen, or canned fruit. ?  cup of 100% fruit juice.  Eat more foods that have soluble fiber. These are apples, broccoli, carrots, beans, peas, and barley. Try to get 20-30 g of fiber per day.  Eat 4-5 servings of nuts, legumes, and seeds per week: ? 1 serving of dried beans or legumes equals  cup after being cooked. ? 1 serving of nuts is  cup. ? 1 serving of seeds equals 1 tablespoon. General information  Eat more home-cooked food. Eat less restaurant, buffet, and fast food.  Limit or avoid alcohol.  Limit foods that are high in starch and sugar.  Avoid fried foods.  Lose weight if you are overweight.  Keep track of how much salt (sodium) you eat. This is important if you have high blood pressure. Ask your doctor to tell you more about this.  Try to add vegetarian meals each week. Fats  Choose healthy  fats. These include olive oil and canola oil, flaxseeds, walnuts, almonds, and seeds.  Eat more omega-3 fats. These include salmon, mackerel, sardines, tuna, flaxseed oil, and ground flaxseeds. Try to eat fish at least 2 times each week.  Check food labels. Avoid foods with trans fats or high amounts of saturated fat.  Limit saturated fats. ? These are often found in animal products, such as meats, butter, and cream. ? These are also found in plant foods, such as palm oil, palm kernel oil, and coconut oil.  Avoid foods with partially hydrogenated oils in them. These have trans fats. Examples are stick margarine, some tub margarines, cookies, crackers, and other baked goods. What foods can I eat? Fruits All fresh, canned (in natural juice), or frozen fruits. Vegetables Fresh or frozen vegetables (raw, steamed, roasted, or grilled). Green salads. Grains  Most grains. Choose whole wheat and whole grains most of the time. Rice and pasta, including brown rice and pastas made with whole wheat. Meats and other proteins Lean, well-trimmed beef, veal, pork, and lamb. Chicken and [place] without skin. All fish and shellfish. Wild duck, rabbit, pheasant, and venison. Egg whites or low-cholesterol egg substitutes. Dried beans, peas, lentils, and tofu. Seeds and most nuts. Dairy Low-fat or nonfat cheeses, including ricotta and mozzarella. Skim or 1% milk that is liquid, powdered, or evaporated. Buttermilk that is made with low-fat milk. Nonfat or low-fat yogurt. Fats and oils Non-hydrogenated (trans-free) margarines. Vegetable oils, including soybean, sesame, sunflower, olive, peanut, safflower, corn, canola, and cottonseed. Salad dressings or mayonnaise made with a vegetable oil. Beverages Mineral water. Coffee and tea. Diet carbonated beverages. Sweets and desserts Sherbet, gelatin, and fruit ice. Small amounts of dark chocolate. Limit all sweets and desserts. Seasonings and condiments All  seasonings and condiments. The items listed above may not be a complete list of foods and drinks you can eat. Contact a dietitian for more options. What foods should I avoid? Fruits Canned fruit in heavy syrup. Fruit in cream or butter sauce. Fried fruit. Limit coconut. Vegetables Vegetables cooked in cheese, cream, or butter sauce. Fried vegetables. Grains Breads that are made with saturated or trans fats, oils, or whole milk. Croissants. Sweet rolls. Donuts. High-fat crackers, such as cheese crackers. Meats and other proteins Fatty meats, such as hot dogs, ribs, sausage, bacon, rib-eye roast or steak. High-fat deli meats, such as salami and bologna. Caviar. Domestic duck and goose. Organ meats, such as liver. Dairy Cream, sour cream, cream cheese, and creamed cottage cheese. Whole-milk cheeses. Whole or 2% milk that is liquid, evaporated, or condensed. Whole buttermilk. Cream sauce or high-fat cheese sauce. Yogurt that is made from whole milk. Fats and oils Meat fat, or shortening. Cocoa butter, hydrogenated oils, palm oil, coconut oil, palm kernel oil. Solid fats and shortenings, including bacon fat, salt pork, lard, and butter. Nondairy cream substitutes. Salad dressings with cheese or sour cream. Beverages Regular sodas and juice drinks with added sugar. Sweets and desserts Frosting. Pudding. Cookies. Cakes. Pies. Milk chocolate or white chocolate. Buttered syrups. Full-fat ice cream or ice cream drinks. The items listed above may not be a complete list of foods and drinks to avoid. Contact a dietitian for more information. Summary  Heart-healthy meal planning includes eating less unhealthy fats, eating more healthy fats, and making other changes in your diet.  Eat a balanced diet. This includes fruits and vegetables, low-fat or nonfat dairy, lean protein, nuts and legumes, whole grains, and heart-healthy oils and fats. This information is not intended to replace advice given to you by  your health care provider. Make sure you discuss any questions you have with your health care provider. Document Released: 10/27/2011 Document Revised: 06/04/2017 Document Reviewed: 06/04/2017 Elsevier Interactive Patient Education  2019 [employer].  Diabetes Mellitus and Nutrition, Adult When you have diabetes (diabetes mellitus), it is very important to have healthy eating habits because your blood sugar (glucose) levels are greatly affected by what you eat and drink. Eating healthy foods in the appropriate amounts, at about the same times every day, can help you:  Control your blood glucose.  Lower your risk of heart disease.  Improve your blood pressure.  Reach or maintain a healthy weight. Every person with diabetes is different, and each person has different needs for a meal plan. Your health care provider may recommend that you work with  a diet and nutrition specialist (dietitian) to make a meal plan that is best for you. Your meal plan may vary depending on factors such as:  The calories you need.  The medicines you take.  Your weight.  Your blood glucose, blood pressure, and cholesterol levels.  Your activity level.  Other health conditions you have, such as heart or kidney disease. How do carbohydrates affect me? Carbohydrates, also called carbs, affect your blood glucose level more than any other type of food. Eating carbs naturally raises the amount of glucose in your blood. Carb counting is a method for keeping track of how many carbs you eat. Counting carbs is important to keep your blood glucose at a healthy level, especially if you use insulin or take certain oral diabetes medicines. It is important to know how many carbs you can safely have in each meal. This is different for every person. Your dietitian can help you calculate how many carbs you should have at each meal and for each snack. Foods that contain carbs include:  Bread, cereal, rice, pasta, and  crackers.  Potatoes and corn.  Peas, beans, and lentils.  Milk and yogurt.  Fruit and juice.  Desserts, such as cakes, cookies, ice cream, and candy. How does alcohol affect me? Alcohol can cause a sudden decrease in blood glucose (hypoglycemia), especially if you use insulin or take certain oral diabetes medicines. Hypoglycemia can be a life-threatening condition. Symptoms of hypoglycemia (sleepiness, dizziness, and confusion) are similar to symptoms of having too much alcohol. If your health care provider says that alcohol is safe for you, follow these guidelines:  Limit alcohol intake to no more than 1 drink per day for nonpregnant women and 2 drinks per day for men. One drink equals 12 oz of beer, 5 oz of wine, or 1 oz of hard liquor.  Do not drink on an empty stomach.  Keep yourself hydrated with water, diet soda, or unsweetened iced tea.  Keep in mind that regular soda, juice, and other mixers may contain a lot of sugar and must be counted as carbs. What are tips for following this plan?  Reading food labels  Start by checking the serving size on the "Nutrition Facts" label of packaged foods and drinks. The amount of calories, carbs, fats, and other nutrients listed on the label is based on one serving of the item. Many items contain more than one serving per package.  Check the total grams (g) of carbs in one serving. You can calculate the number of servings of carbs in one serving by dividing the total carbs by 15. For example, if a food has 30 g of total carbs, it would be equal to 2 servings of carbs.  Check the number of grams (g) of saturated and trans fats in one serving. Choose foods that have low or no amount of these fats.  Check the number of milligrams (mg) of salt (sodium) in one serving. Most people should limit total sodium intake to less than 2,300 mg per day.  Always check the nutrition information of foods labeled as "low-fat" or "nonfat". These foods may be  higher in added sugar or refined carbs and should be avoided.  Talk to your dietitian to identify your daily goals for nutrients listed on the label. Shopping  Avoid buying canned, premade, or processed foods. These foods tend to be high in fat, sodium, and added sugar.  Shop around the outside edge of the grocery store. This includes fresh fruits  and vegetables, bulk grains, fresh meats, and fresh dairy. Cooking  Use low-heat cooking methods, such as baking, instead of high-heat cooking methods like deep frying.  Cook using healthy oils, such as olive, canola, or sunflower oil.  Avoid cooking with butter, cream, or high-fat meats. Meal planning  Eat meals and snacks regularly, preferably at the same times every day. Avoid going long periods of time without eating.  Eat foods high in fiber, such as fresh fruits, vegetables, beans, and whole grains. Talk to your dietitian about how many servings of carbs you can eat at each meal.  Eat 4-6 ounces (oz) of lean protein each day, such as lean meat, chicken, fish, eggs, or tofu. One oz of lean protein is equal to: ? 1 oz of meat, chicken, or fish. ? 1 egg. ?  cup of tofu.  Eat some foods each day that contain healthy fats, such as avocado, nuts, seeds, and fish. Lifestyle  Check your blood glucose regularly.  Exercise regularly as told by your health care provider. This may include: ? 150 minutes of moderate-intensity or vigorous-intensity exercise each week. This could be brisk walking, biking, or water aerobics. ? Stretching and doing strength exercises, such as yoga or weightlifting, at least 2 times a week.  Take medicines as told by your health care provider.  Do not use any products that contain nicotine or tobacco, such as cigarettes and e-cigarettes. If you need help quitting, ask your health care provider.  Work with a Social worker or diabetes educator to identify strategies to manage stress and any emotional and social  challenges. Questions to ask a health care provider  Do I need to meet with a diabetes educator?  Do I need to meet with a dietitian?  What number can I call if I have questions?  When are the best times to check my blood glucose? Where to find more information:  American Diabetes Association: diabetes.org  Academy of Nutrition and Dietetics: www.eatright.[employer] of Diabetes and Digestive and Kidney Diseases (NIH): [employer] Summary  A healthy meal plan will help you control your blood glucose and maintain a healthy lifestyle.  Working with a diet and nutrition specialist (dietitian) can help you make a meal plan that is best for you.  Keep in mind that carbohydrates (carbs) and alcohol have immediate effects on your blood glucose levels. It is important to count carbs and to use alcohol carefully. This information is not intended to replace advice given to you by your health care provider. Make sure you discuss any questions you have with your health care provider. Document Released: 01/22/2005 Document Revised: 11/25/2016 Document Reviewed: 06/01/2016 Elsevier Interactive Patient Education  2019 [employer].

## 2018-09-28 NOTE — Interval H&P Note (Signed)
Cath Lab Visit (complete for each Cath Lab visit)  Clinical Evaluation Leading to the Procedure:   ACS: No.  Non-ACS:    Anginal Classification: CCS III  Anti-ischemic medical therapy: Minimal Therapy (1 class of medications)  Non-Invasive Test Results: No non-invasive testing performed  Prior CABG: No previous CABG      History and Physical Interval Note:  09/28/2018 8:36 AM  Lisa Poole  has presented today for surgery, with the diagnosis of n stemi.  The various methods of treatment have been discussed with the patient and family. After consideration of risks, benefits and other options for treatment, the patient has consented to  Procedure(s): LEFT HEART CATH AND CORONARY ANGIOGRAPHY (N/A) as a surgical intervention.  The patient's history has been reviewed, patient examined, no change in status, stable for surgery.  I have reviewed the patient's chart and labs.  Questions were answered to the patient's satisfaction.     Shelva Majestic

## 2018-09-28 NOTE — Progress Notes (Signed)
[place] for heparin Indication: chest pain/ACS  Allergies  Allergen Reactions  . Ace Inhibitors Other (See Comments)    Flu like symptoms  . Benazepril Other (See Comments)    Flu like symptoms  . Lisinopril Other (See Comments)    Flu like symptoms  . Lotensin [Benazepril Hcl] Other (See Comments)    Flu like symptoms  . Sulfa Antibiotics Nausea And Vomiting    Patient Measurements: Height: 5' (152.4 cm) Weight: 175 lb 14.4 oz (79.8 kg) IBW/kg (Calculated) : 45.5 Heparin Dosing Weight: 63 kg  Vital Signs: Temp: 98 F (36.7 C) (05/19 2117) Temp Source: Oral (05/19 2117) BP: 112/78 (05/19 2130) Pulse Rate: 63 (05/19 2130)  Labs: Recent Labs    09/27/18 1005 09/27/18 1016 09/27/18 1431 09/27/18 2001 09/27/18 2140  HGB 14.7  --   --   --   --   HCT 45.8  --   --   --   --   PLT 354  --   --   --   --   APTT  --  26  --   --   --   LABPROT  --  13.4  --   --   --   INR  --  1.0  --   --   --   HEPARINUNFRC  --   --   --   --  <0.10*  CREATININE 0.68  --   --   --   --   TROPONINI 0.09*  --  0.10* 0.12*  --     Estimated Creatinine Clearance: 75.1 mL/min (by C-G formula based on SCr of 0.68 mg/dL).   Medical History: Past Medical History:  Diagnosis Date  . Aortic insufficiency   . Diabetes mellitus without complication ([place])   . Hypercholesteremia   . Hypertension     Assessment: Pharmacy consulted to dose/monitor heparin drip for ACS in this 53 year old female presenting with SOB. Per med rec, patient was not taking anticoagulants PTA.   Initial heparin level undetectable, RN confirmed rate and no infusion issues  Goal of Therapy:  Heparin level 0.3-0.7 units/ml Monitor platelets by anticoagulation protocol: Yes   Plan:  Increase heparin gtt to 950 units/hr F/u 6 hour heparin level  Bertis Ruddy, PharmD Clinical Pharmacist Please check AMION for all [place] numbers 09/28/2018 12:11 AM

## 2018-09-28 NOTE — H&P (View-Only) (Signed)
Progress Note  Patient Name: Lisa Poole Date of Encounter: 09/28/2018  Primary Cardiologist: No primary care provider on file.   Subjective   No chest pain overnight.  Inpatient Medications    Scheduled Meds: . [MAR Hold] aspirin EC  81 mg Oral Daily  . [MAR Hold] atorvastatin  40 mg Oral q1800  . [MAR Hold] metoprolol tartrate  50 mg Oral BID  . [MAR Hold] sodium chloride flush  3 mL Intravenous Q12H   Continuous Infusions: . sodium chloride    . sodium chloride 1 mL/kg/hr (09/28/18 0521)  . heparin Stopped (09/28/18 0757)   PRN Meds: sodium chloride, [MAR Hold] acetaminophen, [MAR Hold] HYDROcodone-acetaminophen, [MAR Hold] nitroGLYCERIN, [MAR Hold] ondansetron (ZOFRAN) IV, sodium chloride flush   Vital Signs    Vitals:   09/27/18 2118 09/27/18 2130 09/28/18 0524 09/28/18 0539  BP: 113/72 112/78 (!) 110/58   Pulse: (!) 57 63 (!) 59   Resp:      Temp:      TempSrc:      SpO2: 100%  99%   Weight:    79.8 kg  Height:        Intake/Output Summary (Last 24 hours) at 09/28/2018 0807 Last data filed at 09/28/2018 0600 Gross per 24 hour  Intake 647.39 ml  Output -  Net 647.39 ml   Last 3 Weights 09/28/2018 09/27/2018 09/27/2018  Weight (lbs) 176 lb 175 lb 14.4 oz 174 lb  Weight (kg) 79.833 kg 79.788 kg 78.926 kg      Telemetry    NSR - Personally Reviewed  ECG    SB and RBBB with poor RWP - Personally Reviewed  Physical Exam  Stable GEN: No acute distress.   Neck: No JVD Cardiac: RRR, no murmurs, rubs, or gallops.  Respiratory: Clear to auscultation bilaterally. GI: Soft, nontender, non-distended  MS: No edema; No deformity. Neuro:  Nonfocal  Psych: Normal affect   Labs    Chemistry Recent Labs  Lab 09/27/18 1005  NA 138  K 3.9  CL 105  CO2 24  GLUCOSE 125*  BUN 17  CREATININE 0.68  CALCIUM 9.5  GFRNONAA >60  GFRAA >60  ANIONGAP 9     Hematology Recent Labs  Lab 09/27/18 1005 09/28/18 0228  WBC 5.7 6.3  RBC 5.35* 5.11  HGB  14.7 13.8  HCT 45.8 42.6  MCV 85.6 83.4  MCH 27.5 27.0  MCHC 32.1 32.4  RDW 13.6 13.7  PLT 354 305    Cardiac Enzymes Recent Labs  Lab 09/27/18 1005 09/27/18 1431 09/27/18 2001 09/28/18 0228  TROPONINI 0.09* 0.10* 0.12* 0.15*   No results for input(s): TROPIPOC in the last 168 hours.   BNP Recent Labs  Lab 09/27/18 1431  BNP 36.2     DDimer  Recent Labs  Lab 09/27/18 1016  DDIMER 0.47     Radiology    Dg Chest 2 View  Result Date: 09/27/2018 CLINICAL DATA:  Shortness of breath EXAM: CHEST - 2 VIEW COMPARISON:  07/30/2018 FINDINGS: Cardiac shadows within normal limits. The lungs are well aerated bilaterally. No focal infiltrate or sizable effusion is seen. No acute bony abnormality is noted. IMPRESSION: No acute abnormality noted. Electronically Signed   By: Inez Catalina M.D.   On: 09/27/2018 11:00    Cardiac Studies   ECHOCARDIOGRAM: not yet performed.There is a question of Aortic regurgitation  Patient Profile     53 y.o. female with a hx of HTN, HLD, DMII and recent dx of  aortic insufficiency who is being seen today for the evaluation of SOB and NSTEMI.  Assessment & Plan    1. NSTEMI: Slow upward trending TI, Coronary angio today to r/o CAD 2. Aortic regurgitation: Needs echo today to assess AV and LV size. 3. Hyperlipidemia: Achieve preventive target 4. DM II: Achieve Hgb A1C < 7  For questions or updates, please contact [place] Please consult [email or web address] for contact info under        Signed, Sinclair Grooms, MD  09/28/2018, 8:07 AM

## 2018-09-28 NOTE — Progress Notes (Signed)
Site area: Right groin a 5 french sheath was removed by Luberta Mutter RN  Site Prior to Removal:  Level 0  Pressure Applied For 20 MINUTES    Bedrest Beginning at 1005am  Manual:   Yes.    Patient Status During Pull: Stable  Post Pull Groin Site:  Level 0  Post Pull Instructions Given:  Yes.    Post Pull Pulses Present:  Yes.    Dressing Applied:  Yes.    Comments:  VS remain stable

## 2018-11-15 ENCOUNTER — Encounter: Payer: Self-pay | Admitting: *Deleted

## 2018-12-06 ENCOUNTER — Ambulatory Visit: Payer: Medicare Other | Admitting: Family Medicine

## 2018-12-12 ENCOUNTER — Other Ambulatory Visit (HOSPITAL_COMMUNITY)
Admission: RE | Admit: 2018-12-12 | Discharge: 2018-12-12 | Disposition: A | Discharge status: Home / Self Care | Payer: Medicare Other | Source: Ambulatory Visit | Attending: Family Medicine | Admitting: Family Medicine

## 2018-12-12 ENCOUNTER — Encounter: Payer: Self-pay | Admitting: Family Medicine

## 2018-12-12 ENCOUNTER — Ambulatory Visit (INDEPENDENT_AMBULATORY_CARE_PROVIDER_SITE_OTHER): Payer: Medicare Other | Admitting: Family Medicine

## 2018-12-12 ENCOUNTER — Other Ambulatory Visit: Payer: Self-pay

## 2018-12-12 VITALS — BP 128/84 | HR 84 | Wt 176.0 lb

## 2018-12-12 DIAGNOSIS — Z01419 Encounter for gynecological examination (general) (routine) without abnormal findings: Secondary | ICD-10-CM | POA: Insufficient documentation

## 2018-12-12 DIAGNOSIS — N8110 Cystocele, unspecified: Secondary | ICD-10-CM

## 2018-12-12 DIAGNOSIS — Z124 Encounter for screening for malignant neoplasm of cervix: Secondary | ICD-10-CM

## 2018-12-12 HISTORY — DX: Cystocele, unspecified: N81.10

## 2018-12-12 NOTE — Progress Notes (Signed)
  Subjective:     Lisa Poole is a 53 y.o. female and is here for a comprehensive physical exam. The patient reports problems - bulge between her legs.. Needs pap, and feels like something is coming out. Feels it when it is down. Has lost 150 lbs without trying. Unclear etiology. SVD x 4 largest baby 8 lb 10 oz. Denies incontinence. Menopause x 4 years. No bleeding noted Patient admitted with NSTEMI 09/2018 though her cath was essentially normal, w/ normal EF, moderate AR without Stenosis.  The following portions of the patient's history were reviewed and updated as appropriate: allergies, current medications, past family history, past medical history, past social history, past surgical history and problem list.  Review of Systems Pertinent items noted in HPI and remainder of comprehensive ROS otherwise negative.   Objective:    BP 128/84   Pulse 84   Wt 176 lb (79.8 kg)   LMP 07/25/2015 (Exact Date)   BMI 34.37 kg/m  General appearance: alert, cooperative and appears stated age Head: Normocephalic, without obvious abnormality, atraumatic Neck: no adenopathy, supple, symmetrical, trachea midline and thyroid not enlarged, symmetric, no tenderness/mass/nodules Lungs: clear to auscultation bilaterally Breasts: normal appearance, no masses or tenderness Heart: regular rate and rhythm, S1, S2 normal, no murmur, click, rub or gallop Abdomen: soft, non-tender; bowel sounds normal; no masses,  no organomegaly Pelvic: cervix normal in appearance, external genitalia normal, no adnexal masses or tenderness, no cervical motion tenderness, uterus normal size, shape, and consistency and good uterine support, large cystocele noted Extremities: Homans sign is negative, no sign of DVT Pulses: 2+ and symmetric Skin: Skin color, texture, turgor normal. No rashes or lesions Lymph nodes: Cervical, supraclavicular, and axillary nodes normal. Neurologic: Grossly normal    Assessment:     GYN female  exam.  Cystocele     Plan:   Problem List Items Addressed This Visit      Unprioritized   Cystocele without uterine prolapse    Given other medical history, will refer to UroGYN. Discussed her heart as possible contraindication for surgical treatment. She is still sexually active, and does not want a pessary.      Relevant Orders   Ambulatory referral to Urogynecology    Other Visit Diagnoses    Encounter for gynecological examination without abnormal finding    -  Primary   Relevant Orders   Cytology - PAP( [place])   Screening for malignant neoplasm of cervix         Return in 1 year (on 12/12/2019) for a CPE, in person.    See After Visit Summary for Counseling Recommendations

## 2018-12-12 NOTE — Assessment & Plan Note (Signed)
Given other medical history, will refer to Lisa Poole. Discussed her heart as possible contraindication for surgical treatment. She is still sexually active, and does not want a pessary.

## 2018-12-12 NOTE — Patient Instructions (Signed)

## 2018-12-15 LAB — CYTOLOGY - PAP
Adequacy: ABSENT
Diagnosis: NEGATIVE
HPV: NOT DETECTED

## 2018-12-23 ENCOUNTER — Emergency Department (HOSPITAL_COMMUNITY): Payer: Medicare Other

## 2018-12-23 ENCOUNTER — Other Ambulatory Visit: Payer: Self-pay

## 2018-12-23 ENCOUNTER — Emergency Department (HOSPITAL_COMMUNITY)
Admission: EM | Admit: 2018-12-23 | Discharge: 2018-12-23 | Disposition: A | Discharge status: Home / Self Care | Payer: Medicare Other | Attending: Emergency Medicine | Admitting: Emergency Medicine

## 2018-12-23 DIAGNOSIS — Z882 Allergy status to sulfonamides status: Secondary | ICD-10-CM | POA: Insufficient documentation

## 2018-12-23 DIAGNOSIS — K5732 Diverticulitis of large intestine without perforation or abscess without bleeding: Secondary | ICD-10-CM | POA: Diagnosis not present

## 2018-12-23 DIAGNOSIS — Z7984 Long term (current) use of oral hypoglycemic drugs: Secondary | ICD-10-CM | POA: Insufficient documentation

## 2018-12-23 DIAGNOSIS — E782 Mixed hyperlipidemia: Secondary | ICD-10-CM | POA: Insufficient documentation

## 2018-12-23 DIAGNOSIS — Z9861 Coronary angioplasty status: Secondary | ICD-10-CM | POA: Insufficient documentation

## 2018-12-23 DIAGNOSIS — Z881 Allergy status to other antibiotic agents status: Secondary | ICD-10-CM | POA: Insufficient documentation

## 2018-12-23 DIAGNOSIS — J45909 Unspecified asthma, uncomplicated: Secondary | ICD-10-CM | POA: Insufficient documentation

## 2018-12-23 DIAGNOSIS — Z79899 Other long term (current) drug therapy: Secondary | ICD-10-CM | POA: Insufficient documentation

## 2018-12-23 DIAGNOSIS — I10 Essential (primary) hypertension: Secondary | ICD-10-CM | POA: Insufficient documentation

## 2018-12-23 DIAGNOSIS — E119 Type 2 diabetes mellitus without complications: Secondary | ICD-10-CM | POA: Insufficient documentation

## 2018-12-23 DIAGNOSIS — Z7982 Long term (current) use of aspirin: Secondary | ICD-10-CM | POA: Diagnosis not present

## 2018-12-23 DIAGNOSIS — R1032 Left lower quadrant pain: Secondary | ICD-10-CM | POA: Insufficient documentation

## 2018-12-23 DIAGNOSIS — I252 Old myocardial infarction: Secondary | ICD-10-CM | POA: Diagnosis not present

## 2018-12-23 DIAGNOSIS — R1031 Right lower quadrant pain: Secondary | ICD-10-CM | POA: Diagnosis present

## 2018-12-23 LAB — COMPREHENSIVE METABOLIC PANEL
ALT: 18 U/L (ref 0–44)
AST: 23 U/L (ref 15–41)
Albumin: 3.8 g/dL (ref 3.5–5.0)
Alkaline Phosphatase: 56 U/L (ref 38–126)
Anion gap: 12 (ref 5–15)
BUN: 13 mg/dL (ref 6–20)
CO2: 26 mmol/L (ref 22–32)
Calcium: 9.8 mg/dL (ref 8.9–10.3)
Chloride: 102 mmol/L (ref 98–111)
Creatinine, Ser: 1.08 mg/dL — ABNORMAL HIGH (ref 0.44–1.00)
GFR calc Af Amer: >60 mL/min (ref >60)
GFR calc non Af Amer: 58 mL/min — ABNORMAL LOW (ref >60)
Glucose, Bld: 212 mg/dL — ABNORMAL HIGH (ref 70–99)
Potassium: 3.9 mmol/L (ref 3.5–5.1)
Sodium: 140 mmol/L (ref 135–145)
Total Bilirubin: 1.1 mg/dL (ref 0.3–1.2)
Total Protein: 7 g/dL (ref 6.5–8.1)

## 2018-12-23 LAB — URINALYSIS, ROUTINE W REFLEX MICROSCOPIC
Bacteria, UA: NONE SEEN
Bilirubin Urine: NEGATIVE
Glucose, UA: >=500 mg/dL — AB
Hgb urine dipstick: NEGATIVE
Ketones, ur: NEGATIVE mg/dL
Leukocytes,Ua: NEGATIVE
Nitrite: NEGATIVE
Protein, ur: NEGATIVE mg/dL
Specific Gravity, Urine: 1.028 (ref 1.005–1.030)
pH: 6 (ref 5.0–8.0)

## 2018-12-23 LAB — CBC
HCT: 48.5 % — ABNORMAL HIGH (ref 36.0–46.0)
Hemoglobin: 15.2 g/dL — ABNORMAL HIGH (ref 12.0–15.0)
MCH: 26.5 pg (ref 26.0–34.0)
MCHC: 31.3 g/dL (ref 30.0–36.0)
MCV: 84.5 fL (ref 80.0–100.0)
Platelets: 395 10*3/uL (ref 150–400)
RBC: 5.74 MIL/uL — ABNORMAL HIGH (ref 3.87–5.11)
RDW: 13.2 % (ref 11.5–15.5)
WBC: 7 10*3/uL (ref 4.0–10.5)
nRBC: 0 % (ref 0.0–0.2)

## 2018-12-23 LAB — LIPASE, BLOOD: Lipase: 39 U/L (ref 11–51)

## 2018-12-23 MED ORDER — METRONIDAZOLE 500 MG PO TABS
500.0000 mg | ORAL_TABLET | Freq: Once | ORAL | Status: AC
  Administered 2018-12-23: 500 mg via ORAL
  Filled 2018-12-23: qty 1

## 2018-12-23 MED ORDER — CIPROFLOXACIN HCL 500 MG PO TABS
500.0000 mg | ORAL_TABLET | Freq: Once | ORAL | Status: AC
  Administered 2018-12-23: 500 mg via ORAL
  Filled 2018-12-23: qty 1

## 2018-12-23 MED ORDER — OXYCODONE-ACETAMINOPHEN 5-325 MG PO TABS
1.0000 | ORAL_TABLET | Freq: Once | ORAL | Status: AC
  Administered 2018-12-23: 1 via ORAL
  Filled 2018-12-23: qty 1

## 2018-12-23 MED ORDER — CIPROFLOXACIN HCL 500 MG PO TABS: 500.0000 mg | ORAL_TABLET | Freq: Two times a day (BID) | ORAL | 0 refills | Status: DC

## 2018-12-23 MED ORDER — METRONIDAZOLE 500 MG PO TABS: 500.0000 mg | ORAL_TABLET | Freq: Two times a day (BID) | ORAL | 0 refills | Status: DC

## 2018-12-23 MED ORDER — IOHEXOL 300 MG/ML  SOLN
100.0000 mL | Freq: Once | INTRAMUSCULAR | Status: AC | PRN
  Administered 2018-12-23: 100 mL via INTRAVENOUS

## 2018-12-23 MED ORDER — MORPHINE SULFATE (PF) 4 MG/ML IV SOLN
4.0000 mg | Freq: Once | INTRAVENOUS | Status: AC
  Administered 2018-12-23: 20:00:00 4 mg via INTRAVENOUS
  Filled 2018-12-23: qty 1

## 2018-12-23 MED ORDER — ONDANSETRON HCL 4 MG/2ML IJ SOLN
4.0000 mg | Freq: Once | INTRAMUSCULAR | Status: AC
  Administered 2018-12-23: 4 mg via INTRAVENOUS
  Filled 2018-12-23: qty 2

## 2018-12-23 MED ORDER — SODIUM CHLORIDE 0.9% FLUSH: 3.0000 mL | Freq: Once | INTRAVENOUS | Status: DC

## 2018-12-23 MED ORDER — MORPHINE SULFATE (PF) 4 MG/ML IV SOLN
4.0000 mg | Freq: Once | INTRAVENOUS | Status: AC
  Administered 2018-12-23: 4 mg via INTRAVENOUS
  Filled 2018-12-23: qty 1

## 2018-12-23 NOTE — ED Notes (Signed)
Called CT to inquire about long wait for CT scan

## 2018-12-23 NOTE — ED Triage Notes (Signed)
Pt reports sudden onset of of lower abd pain with radiation into pelvic area. No n/v/d. Pt has hx of bladder prolapse.

## 2018-12-23 NOTE — ED Provider Notes (Signed)
Care assumed from PA [place] at shift change, please see his note for full details, but in brief Lisa Poole is a 53 y.o. female with a history of diverticulitis who presents to the ED for evaluation of left lower quadrant abdominal pain.  Labs show mild hyperglycemia of 212, which should improve with IV fluids otherwise reassuring with no leukocytosis.  Suspect diverticulitis episode again, vitals are stable.  Plan: Follow-up on CT results, if uncomplicated diverticulitis anticipate discharge with antibiotics and pain medication.  Labs Reviewed  COMPREHENSIVE METABOLIC PANEL - Abnormal; Notable for the following components:      Result Value   Glucose, Bld 212 (*)    Creatinine, Ser 1.08 (*)    GFR calc non Af Amer 58 (*)    All other components within normal limits  CBC - Abnormal; Notable for the following components:   RBC 5.74 (*)    Hemoglobin 15.2 (*)    HCT 48.5 (*)    All other components within normal limits  URINALYSIS, ROUTINE W REFLEX MICROSCOPIC - Abnormal; Notable for the following components:   Glucose, UA >=500 (*)    All other components within normal limits  LIPASE, BLOOD   Ct Abdomen Pelvis W Contrast  Result Date: 12/23/2018 CLINICAL DATA:  Abdominal pain lower abdominal pain EXAM: CT ABDOMEN AND PELVIS WITH CONTRAST TECHNIQUE: Multidetector CT imaging of the abdomen and pelvis was performed using the standard protocol following bolus administration of intravenous contrast. CONTRAST:  173mL OMNIPAQUE IOHEXOL 300 MG/ML  SOLN COMPARISON:  CT 06/05/2017 FINDINGS: Lower chest: Lung bases demonstrate no acute consolidation or pleural effusion. Heart size is normal Hepatobiliary: Hepatic steatosis. No calcified gallstone or biliary dilatation Pancreas: Unremarkable. No pancreatic ductal dilatation or surrounding inflammatory changes. Spleen: Normal in size without focal abnormality. Adrenals/Urinary Tract: Adrenal glands are unremarkable. Kidneys are normal, without renal  calculi, focal lesion, or hydronephrosis. Bladder is unremarkable. Stomach/Bowel: The stomach is nonenlarged. No dilated small bowel. Prior appendectomy. Wall thickening with adjacent edema at the sigmoid colon, consistent with acute diverticular disease. Scattered diverticula elsewhere throughout the colon. Vascular/Lymphatic: Mild aortic atherosclerosis. No aneurysm. No significantly enlarged lymph nodes Reproductive: Uterus and bilateral adnexa are unremarkable. Other: Negative for free air or free fluid Musculoskeletal: Prominent right greater than left SI joint sclerosis. No fracture IMPRESSION: 1. Findings consistent with acute sigmoid colon diverticulitis. Negative for perforation or abscess 2. Hepatic steatosis Electronically Signed   By: Donavan Foil M.D.   On: 12/23/2018 22:43    MDM  53 year old female with CT showing uncomplicated acute sigmoid diverticulitis, no perforation or abscess.  Pain has been well controlled here in the ED, vital stable, will give first p.o. doses of antibiotics here in the ED and discharge home.  Patient is already prescribed hydrocodone which she is comfortable using at home for pain.  Strict return precautions discussed.  PCP follow-up recommended.  Patient expresses understanding and agreement with plan.  Discharged home in good condition.  Final diagnoses:  Sigmoid diverticulitis    ED Discharge Orders         Ordered    ciprofloxacin (CIPRO) 500 MG tablet  2 times daily     12/23/18 2307    metroNIDAZOLE (FLAGYL) 500 MG tablet  2 times daily     12/23/18 2307            Jacqlyn Larsen, PA-C 12/23/18 2309    Maudie Flakes, MD 12/24/18 [phone]

## 2018-12-23 NOTE — ED Provider Notes (Signed)
[facility] EMERGENCY DEPARTMENT Provider Note   CSN: [id number] Arrival date & time: 12/23/18  1203     History   Chief Complaint Chief Complaint  Patient presents with  . Abdominal Pain    HPI Lisa Poole is a 54 y.o. female.     The history is provided by the patient and medical records. No language interpreter was used.  Abdominal Pain    53 year old female with significant history of CAD, diabetes, hypertension, asthma presenting for evaluation of abdominal pain.  Patient reports since last night she developed gradual onset of progressive worsening lower abdominal pain.  Described pain as an achy sensation that wraps around her lower abdomen, persistent, worsening with sitting or with movement.  Pain is moderate in severity, keeping her up.  She notes mild nausea.  No report of fever chills no chest pain shortness of breath no dysuria hematuria no constipation or diarrhea.  She has remote history of diverticulitis.  She has had prior appendectomy.  She currently rates the pain as 8 out of 10.  She has had a colonoscopy in the past.  Past Medical History:  Diagnosis Date  . Aortic insufficiency   . Asthma 09/13/2006  . Diabetes mellitus without complication ([place])   . Hypercholesteremia   . Hypertension     Patient Active Problem List   Diagnosis Date Noted  . Cystocele without uterine prolapse 12/12/2018  . Elevated troponin level   . Chest discomfort   . NSTEMI (non-ST elevated myocardial infarction) ([place]) 09/27/2018  . Primary osteoarthritis of left knee 12/10/2017  . S/P knee replacement 12/10/2017  . Acromioclavicular arthrosis 08/01/2013  . Aortic insufficiency   . Dry eye syndrome 11/25/2007  . Asthma 09/13/2006  . Diabetes mellitus ([place]) 09/13/2006  . HTN (hypertension) 09/13/2006    Past Surgical History:  Procedure Laterality Date  . APPENDECTOMY    . LEFT HEART CATH AND CORONARY ANGIOGRAPHY N/A 09/28/2018   Procedure: LEFT HEART  CATH AND CORONARY ANGIOGRAPHY;  Surgeon: Troy Sine, MD;  Location: [facility];  Service: Cardiovascular;  Laterality: N/A;  . ROTATOR CUFF REPAIR Bilateral 2017  . SINUS EXPLORATION    . TOTAL KNEE ARTHROPLASTY Left 12/10/2017   Procedure: LEFT TOTAL KNEE ARTHROPLASTY;  Surgeon: Sydnee Cabal, MD;  Location: [facility];  Service: Orthopedics;  Laterality: Left;  Adductor Block  . TUBAL LIGATION       OB History    Gravida  5   Para  4   Term  1   Preterm  3   AB  1   Living  4     SAB  1   TAB  0   Ectopic  0   Multiple  0   Live Births  4            Home Medications    Prior to Admission medications   Medication Sig Start Date End Date Taking? Authorizing Provider  albuterol (PROVENTIL HFA;VENTOLIN HFA) 108 (90 Base) MCG/ACT inhaler Inhale 1-2 puffs into the lungs every 6 (six) hours as needed for wheezing or shortness of breath. 07/30/18   Drenda Freeze, MD  aspirin 81 MG chewable tablet Chew 81 mg by mouth daily.    [provider]  gabapentin (NEURONTIN) 300 MG capsule Take 600 mg by mouth 2 (two) times daily.     [provider]  hydrochlorothiazide (MICROZIDE) 12.5 MG capsule Take 12.5 mg by mouth daily.    [provider]  HYDROcodone-acetaminophen (NORCO) 10-325 MG tablet Take 1 tablet by mouth every 6 (six) hours as needed for moderate pain or severe pain.     [provider]  INVOKAMET XR 50-500 MG TB24 Take 1 tablet by mouth 2 (two) times daily. 12/12/15   [provider]  losartan (COZAAR) 25 MG tablet Take 0.5 tablets (12.5 mg total) by mouth daily. 09/29/18   Cheryln Manly, NP  lovastatin (MEVACOR) 10 MG tablet Take 10 mg by mouth at bedtime.     [provider]  VITAMIN D PO Take 1 tablet by mouth daily.    [provider]    Family History Family History  Problem Relation Age of Onset  . Colon cancer Maternal Grandmother   . Alzheimer's disease Mother     Social  History Social History   Tobacco Use  . Smoking status: Never Smoker  . Smokeless tobacco: Never Used  Substance Use Topics  . Alcohol use: No  . Drug use: No     Allergies   Ace inhibitors, Augmentin [amoxicillin-pot clavulanate], Benazepril, Lisinopril, Lotensin [benazepril hcl], and Sulfa antibiotics   Review of Systems Review of Systems  Gastrointestinal: Positive for abdominal pain.  All other systems reviewed and are negative.    Physical Exam Updated Vital Signs BP (!) 152/84 (BP Location: Right Arm)   Pulse 92   Temp 98.8 F (37.1 C) (Oral)   Resp 20   LMP 07/25/2015 (Exact Date)   SpO2 99%   Physical Exam Vitals signs and nursing note reviewed.  Constitutional:      General: She is not in acute distress.    Appearance: She is well-developed.  HENT:     Head: Atraumatic.  Eyes:     Conjunctiva/sclera: Conjunctivae normal.  Neck:     Musculoskeletal: Neck supple.  Cardiovascular:     Rate and Rhythm: Normal rate and regular rhythm.  Pulmonary:     Effort: Pulmonary effort is normal.     Breath sounds: Normal breath sounds. No wheezing, rhonchi or rales.  Abdominal:     General: Abdomen is flat. Bowel sounds are normal.     Tenderness: There is abdominal tenderness in the suprapubic area and left lower quadrant. There is no right CVA tenderness or left CVA tenderness. Negative signs include Murphy's sign, Rovsing's sign and McBurney's sign.  Skin:    Findings: No rash.  Neurological:     Mental Status: She is alert.      ED Treatments / Results  Labs (all labs ordered are listed, but only abnormal results are displayed) Labs Reviewed  COMPREHENSIVE METABOLIC PANEL - Abnormal; Notable for the following components:      Result Value   Glucose, Bld 212 (*)    Creatinine, Ser 1.08 (*)    GFR calc non Af Amer 58 (*)    All other components within normal limits  CBC - Abnormal; Notable for the following components:   RBC 5.74 (*)    Hemoglobin  15.2 (*)    HCT 48.5 (*)    All other components within normal limits  URINALYSIS, ROUTINE W REFLEX MICROSCOPIC - Abnormal; Notable for the following components:   Glucose, UA >=500 (*)    All other components within normal limits  LIPASE, BLOOD    EKG None  Radiology No results found.  Procedures Procedures (including critical care time)  Medications Ordered in ED Medications  sodium chloride flush (NS) 0.9 % injection 3 mL (has no  administration in time range)     Initial Impression / Assessment and Plan / ED Course  I have reviewed the triage vital signs and the nursing notes.  Pertinent labs & imaging results that were available during my care of the patient were reviewed by me and considered in my medical decision making (see chart for details).        BP (!) 152/84 (BP Location: Right Arm)   Pulse 92   Temp 98.8 F (37.1 C) (Oral)   Resp 20   LMP 07/25/2015 (Exact Date)   SpO2 99%    Final Clinical Impressions(s) / ED Diagnoses   Final diagnoses:  None    ED Discharge Orders    None     3:54 PM Patient here with lower abdominal pain radiates to her lower back.  History of diverticular disease in the past therefore she would benefit from abdominal pelvis CT scan for further evaluation.  She has had prior appendectomy.  History of vaginal prolapse but denies any dysuria or vaginal pain.  6:51 PM Pt resting comfortably.  She is currently awaits abd/pelvis CT scan.  Pt sign out to oncoming provider who will f/u on CT result, reasssess and determine disposition.    Domenic Moras, PA-C 12/23/18 Yevette Edwards    Veryl Speak, MD 12/23/18 Sharilyn Sites

## 2018-12-23 NOTE — Discharge Instructions (Signed)
Please take entire course of antibiotics as directed.  I would also recommend using a daily stool softener such as MiraLAX to help keep regular bowel movements.  Your CT scan showed diverticulitis without any complication, in some cases despite antibiotics patients can still develop abscess or perforation, if you develop fevers, worsening abdominal pain, persistent vomiting or any other new or concerning symptoms please return to the ED for reevaluation otherwise please follow-up with your primary care or GI doctor.

## 2018-12-23 NOTE — ED Notes (Signed)
Pt transported to CT ?

## 2019-01-01 ENCOUNTER — Other Ambulatory Visit: Payer: Self-pay | Admitting: Cardiology

## 2019-02-10 ENCOUNTER — Encounter (HOSPITAL_COMMUNITY): Payer: Self-pay | Admitting: *Deleted

## 2019-02-10 ENCOUNTER — Emergency Department (HOSPITAL_COMMUNITY): Payer: Medicare Other

## 2019-02-10 ENCOUNTER — Inpatient Hospital Stay (HOSPITAL_COMMUNITY)
Admission: EM | Admit: 2019-02-10 | Discharge: 2019-02-14 | DRG: 244 | Disposition: A | Discharge status: Home / Self Care | Payer: Medicare Other | Attending: Internal Medicine | Admitting: Internal Medicine

## 2019-02-10 ENCOUNTER — Other Ambulatory Visit: Payer: Self-pay

## 2019-02-10 DIAGNOSIS — Z79899 Other long term (current) drug therapy: Secondary | ICD-10-CM

## 2019-02-10 DIAGNOSIS — I459 Conduction disorder, unspecified: Secondary | ICD-10-CM

## 2019-02-10 DIAGNOSIS — I252 Old myocardial infarction: Secondary | ICD-10-CM

## 2019-02-10 DIAGNOSIS — Z79891 Long term (current) use of opiate analgesic: Secondary | ICD-10-CM

## 2019-02-10 DIAGNOSIS — Z7982 Long term (current) use of aspirin: Secondary | ICD-10-CM

## 2019-02-10 DIAGNOSIS — Z950 Presence of cardiac pacemaker: Secondary | ICD-10-CM

## 2019-02-10 DIAGNOSIS — Z800 Family history of malignant neoplasm of digestive organs: Secondary | ICD-10-CM

## 2019-02-10 DIAGNOSIS — I214 Non-ST elevation (NSTEMI) myocardial infarction: Secondary | ICD-10-CM | POA: Diagnosis not present

## 2019-02-10 DIAGNOSIS — I4510 Unspecified right bundle-branch block: Secondary | ICD-10-CM | POA: Diagnosis present

## 2019-02-10 DIAGNOSIS — E876 Hypokalemia: Secondary | ICD-10-CM | POA: Diagnosis present

## 2019-02-10 DIAGNOSIS — I442 Atrioventricular block, complete: Principal | ICD-10-CM | POA: Diagnosis present

## 2019-02-10 DIAGNOSIS — Z885 Allergy status to narcotic agent status: Secondary | ICD-10-CM

## 2019-02-10 DIAGNOSIS — J45909 Unspecified asthma, uncomplicated: Secondary | ICD-10-CM | POA: Diagnosis present

## 2019-02-10 DIAGNOSIS — Z888 Allergy status to other drugs, medicaments and biological substances status: Secondary | ICD-10-CM

## 2019-02-10 DIAGNOSIS — Z882 Allergy status to sulfonamides status: Secondary | ICD-10-CM

## 2019-02-10 DIAGNOSIS — M545 Low back pain: Secondary | ICD-10-CM | POA: Diagnosis not present

## 2019-02-10 DIAGNOSIS — Z96652 Presence of left artificial knee joint: Secondary | ICD-10-CM | POA: Diagnosis present

## 2019-02-10 DIAGNOSIS — I351 Nonrheumatic aortic (valve) insufficiency: Secondary | ICD-10-CM | POA: Diagnosis present

## 2019-02-10 DIAGNOSIS — Z820 Family history of epilepsy and other diseases of the nervous system: Secondary | ICD-10-CM

## 2019-02-10 DIAGNOSIS — I10 Essential (primary) hypertension: Secondary | ICD-10-CM | POA: Diagnosis present

## 2019-02-10 DIAGNOSIS — E119 Type 2 diabetes mellitus without complications: Secondary | ICD-10-CM | POA: Diagnosis present

## 2019-02-10 DIAGNOSIS — E7800 Pure hypercholesterolemia, unspecified: Secondary | ICD-10-CM | POA: Diagnosis present

## 2019-02-10 DIAGNOSIS — Z20828 Contact with and (suspected) exposure to other viral communicable diseases: Secondary | ICD-10-CM | POA: Diagnosis present

## 2019-02-10 DIAGNOSIS — E785 Hyperlipidemia, unspecified: Secondary | ICD-10-CM | POA: Diagnosis present

## 2019-02-10 DIAGNOSIS — Z23 Encounter for immunization: Secondary | ICD-10-CM

## 2019-02-10 LAB — BASIC METABOLIC PANEL
Anion gap: 11 (ref 5–15)
BUN: 19 mg/dL (ref 6–20)
CO2: 22 mmol/L (ref 22–32)
Calcium: 9.8 mg/dL (ref 8.9–10.3)
Chloride: 106 mmol/L (ref 98–111)
Creatinine, Ser: 0.9 mg/dL (ref 0.44–1.00)
GFR calc Af Amer: >60 mL/min (ref >60)
GFR calc non Af Amer: >60 mL/min (ref >60)
Glucose, Bld: 118 mg/dL — ABNORMAL HIGH (ref 70–99)
Potassium: 3.7 mmol/L (ref 3.5–5.1)
Sodium: 139 mmol/L (ref 135–145)

## 2019-02-10 LAB — CBC
HCT: 43.9 % (ref 36.0–46.0)
Hemoglobin: 14.1 g/dL (ref 12.0–15.0)
MCH: 27.1 pg (ref 26.0–34.0)
MCHC: 32.1 g/dL (ref 30.0–36.0)
MCV: 84.4 fL (ref 80.0–100.0)
Platelets: 308 10*3/uL (ref 150–400)
RBC: 5.2 MIL/uL — ABNORMAL HIGH (ref 3.87–5.11)
RDW: 15.2 % (ref 11.5–15.5)
WBC: 6.3 10*3/uL (ref 4.0–10.5)
nRBC: 0 % (ref 0.0–0.2)

## 2019-02-10 LAB — TROPONIN I (HIGH SENSITIVITY)
Troponin I (High Sensitivity): 104 ng/L (ref <18)
Troponin I (High Sensitivity): 113 ng/L (ref <18)

## 2019-02-10 LAB — I-STAT BETA HCG BLOOD, ED (MC, WL, AP ONLY): I-stat hCG, quantitative: <5 m[IU]/mL (ref <5)

## 2019-02-10 MED ORDER — HEPARIN BOLUS VIA INFUSION
3500.0000 [IU] | Freq: Once | INTRAVENOUS | Status: AC
  Administered 2019-02-11: 01:00:00 3500 [IU] via INTRAVENOUS
  Filled 2019-02-10: qty 3500

## 2019-02-10 MED ORDER — NITROGLYCERIN IN D5W 200-5 MCG/ML-% IV SOLN
0.0000 ug/min | INTRAVENOUS | Status: DC
  Administered 2019-02-10: 23:00:00 5 ug/min via INTRAVENOUS
  Filled 2019-02-10: qty 250

## 2019-02-10 MED ORDER — SODIUM CHLORIDE 0.9% FLUSH
3.0000 mL | Freq: Once | INTRAVENOUS | Status: AC
  Administered 2019-02-10: 23:00:00 3 mL via INTRAVENOUS

## 2019-02-10 MED ORDER — MORPHINE SULFATE (PF) 2 MG/ML IV SOLN: 2.0000 mg | Freq: Once | INTRAVENOUS | Status: DC

## 2019-02-10 MED ORDER — HEPARIN (PORCINE) 25000 UT/250ML-% IV SOLN
900.0000 [IU]/h | INTRAVENOUS | Status: DC
  Administered 2019-02-11 (×2): 750 [IU]/h via INTRAVENOUS
  Filled 2019-02-10 (×2): qty 250

## 2019-02-10 NOTE — H&P (Signed)
CARDIOLOGY H&P  HPI: Lisa Poole is a 53 y.o. female w history of chest pain, MINOCA, DM, HTN, aortic insufficiency presenting with chest pain.    The patient presents today describing approximately 1 to 2 weeks of worsening chest discomfort.  She describes a sensation of pressure in her chest as if someone is pressing on her central and left chest.  The discomfort has been happening now every day for the past 1 to 2 weeks and has been getting worse.  Today, her symptoms are worse than they have been in the past.  She also describes new back pain which she believes starts in her chest and radiates through to the left side of her back.  She has never had anything like this before.  She denies any falls or trauma.  Movement does not worsen the pain in her back, however it does make the pressure sensation in her chest worse.  She states that even walking a few steps or bending over makes her chest pressure worse.  Patient denies any recent changes to her medications.  She denies any recent alcohol or illicit substance use.  In the emergency department, the patient was found to be normotensive with a heart rate of around 50 and normal oxygen saturation on room air.  An initial ECG obtained revealed complete heart block with a junctional escape rhythm at 50 bpm and her previously known right bundle branch block.  No acute ST or T wave abnormalities were noted.  A right-sided ECG was performed which similarly did not reveal any evidence of acute ischemia.  An initial high-sensitivity troponin came back elevated at 113.  She was started on a low-dose nitroglycerin drip and a heparin drip and was admitted for further management.  Of note, the patient has had multiple prior presentations for chest pain.  She was previously seen in the ED in March 2020 for similar symptoms of chest pain and shortness of breath.  Her troponin was negative at that time and a PE protocol CT angiogram was negative.  She then  returned in May 2020 describing similar symptoms of chest pain and shortness of breath.  This time however she had a positive troponin which peaked at 0.15.  Her ECG showed normal sinus rhythm with a right bundle branch block but no other acute abnormalities.  An echocardiogram was obtained and revealed a normal ejection fraction without wall motion abnormalities and with mild to moderate aortic insufficiency.  She additionally underwent coronary angiography which revealed a normal LVEDP and no evidence of coronary disease.  She states that she was not given a definitive diagnosis for the cause for her symptoms.  Her symptoms however abated for the next several months until her current presentation.   Review of Systems:     Cardiac Review of Systems: {Y] = yes [ ]  = no  Chest Pain [Y]  Resting SOB [   ] Exertional SOB  [Y]  Orthopnea [  ]   Pedal Edema [   ]    Palpitations [  ] Syncope  [  ]   Presyncope [   ]  General Review of Systems: [Y] = yes [  ]=no Constitional: recent weight change [  ]; anorexia [  ]; fatigue [  ]; nausea [  ]; night sweats [  ]; fever [  ]; or chills [  ];  Dental: poor dentition[  ];   Eye : blurred vision [  ]; diplopia [   ]; vision changes [  ];  Amaurosis fugax[  ]; Resp: cough [  ];  wheezing[  ];  hemoptysis[  ]; shortness of breath[  ]; paroxysmal nocturnal dyspnea[  ]; dyspnea on exertion[  ]; or orthopnea[  ];  GI:  gallstones[  ], vomiting[  ];  dysphagia[  ]; melena[  ];  hematochezia [  ]; heartburn[  ];   GU: kidney stones [  ]; hematuria[  ];   dysuria [  ];  nocturia[  ];               Skin: rash [  ], swelling[  ];, hair loss[  ];  peripheral edema[  ];  or itching[  ]; Musculosketetal: myalgias[  ];  joint swelling[  ];  joint erythema[  ];  joint pain[  ];  back pain[  ];  Heme/Lymph: bruising[  ];  bleeding[  ];  anemia[  ];  Neuro: TIA[  ];  headaches[  ];  stroke[  ];  vertigo[  ];   seizures[  ];   paresthesias[  ];  difficulty walking[  ];  Psych:depression[  ]; anxiety[  ];  Endocrine: diabetes[  ];  thyroid dysfunction[  ];  Other:  Past Medical History:  Diagnosis Date   Aortic insufficiency    Asthma 09/13/2006   Diabetes mellitus without complication ([place])    Hypercholesteremia    Hypertension     Prior to Admission medications   Medication Sig Start Date End Date Taking? Authorizing Provider  albuterol (PROVENTIL HFA;VENTOLIN HFA) 108 (90 Base) MCG/ACT inhaler Inhale 1-2 puffs into the lungs every 6 (six) hours as needed for wheezing or shortness of breath. 07/30/18  Yes Drenda Freeze, MD  aspirin 81 MG chewable tablet Chew 81 mg by mouth at bedtime.    Yes [provider]  Cholecalciferol (VITAMIN D-3) 25 MCG (1000 UT) CAPS Take 1,000-2,000 Units by mouth daily.   Yes [provider]  gabapentin (NEURONTIN) 600 MG tablet Take 600 mg by mouth 2 (two) times daily.   Yes [provider]  hydrochlorothiazide (MICROZIDE) 12.5 MG capsule Take 12.5 mg by mouth daily.   Yes [provider]  HYDROcodone-acetaminophen (NORCO) 10-325 MG tablet Take 1 tablet by mouth 2 (two) times daily.    Yes [provider]  losartan (COZAAR) 25 MG tablet Take 0.5 tablets (12.5 mg total) by mouth daily. 09/29/18  Yes Reino Bellis B, NP  lovastatin (MEVACOR) 10 MG tablet Take 10 mg by mouth at bedtime.    Yes [provider]  ciprofloxacin (CIPRO) 500 MG tablet Take 1 tablet (500 mg total) by mouth 2 (two) times daily. One po bid x 7 days Patient not taking: Reported on 02/10/2019 12/23/18   Jacqlyn Larsen, PA-C  INVOKAMET XR 50-500 MG TB24 Take 1 tablet by mouth 2 (two) times daily. 12/12/15   [provider]  metroNIDAZOLE (FLAGYL) 500 MG tablet Take 1 tablet (500 mg total) by mouth 2 (two) times daily. One po bid x 7 days Patient not taking: Reported on 02/10/2019 12/23/18   Jacqlyn Larsen, PA-C      Allergies   Allergen Reactions   Lisinopril Other (See Comments) and Cough    Flu like symptoms, also    Ace Inhibitors Other (See Comments)    Flu-like symptoms   Augmentin [Amoxicillin-Pot Clavulanate] Nausea And Vomiting  Benazepril Other (See Comments)    Flu like symptoms   Lotensin [Benazepril Hcl] Other (See Comments)    Flu like symptoms   Oxycodone-Acetaminophen Nausea And Vomiting   Sulfa Antibiotics Nausea And Vomiting    Social History   Socioeconomic History   Marital status: Married    Spouse name: Not on file   Number of children: Not on file   Years of education: Not on file   Highest education level: Not on file  Occupational History   Not on file  Social Needs   Financial resource strain: Not on file   Food insecurity    Worry: Not on file    Inability: Not on file   Transportation needs    Medical: Not on file    Non-medical: Not on file  Tobacco Use   Smoking status: Never Smoker   Smokeless tobacco: Never Used  Substance and Sexual Activity   Alcohol use: No   Drug use: No   Sexual activity: Yes    Birth control/protection: Post-menopausal  Lifestyle   Physical activity    Days per week: Not on file    Minutes per session: Not on file   Stress: Not on file  Relationships   Social connections    Talks on phone: Not on file    Gets together: Not on file    Attends religious service: Not on file    Active member of club or organization: Not on file    Attends meetings of clubs or organizations: Not on file    Relationship status: Not on file   Intimate partner violence    Fear of current or ex partner: Not on file    Emotionally abused: Not on file    Physically abused: Not on file    Forced sexual activity: Not on file  Other Topics Concern   Not on file  Social History Narrative   Not on file    Family History  Problem Relation Age of Onset   Colon cancer Maternal Grandmother    Alzheimer's disease Mother      PHYSICAL EXAM: Vitals:   02/10/19 2250 02/10/19 2300  BP: 120/87 (!) 137/117  Pulse: 73 (!) 51  Resp: 18 16  Temp:    SpO2: 100% 100%   General:  Anxious. No respiratory difficulty HEENT: normal Neck: supple. no JVD. Carotids 2+ bilat; no bruits. No lymphadenopathy or thryomegaly appreciated. Cor: PMI nondisplaced. Bradycardia. Regular rhythm. No appreciable rubs, gallops or murmurs. Lungs: clear Abdomen: soft, nontender, nondistended. No hepatosplenomegaly. No bruits or masses. Good bowel sounds. Extremities: no cyanosis, clubbing, rash, edema Neuro: alert & oriented x 3, cranial nerves grossly intact. moves all 4 extremities w/o difficulty. Affect pleasant.  ECG: initial ECG w sinus rate 90bpm, ventricular rate 50bpm, AV dissociation, complete heart block, right axis deviation, anteroseptal Q waves, low precordial lead voltage, minimal nonspecific ST/T wave abnormalities  Right sided ECG: similar to above without any evidence of R sided infarct pattern  Results for orders placed or performed during the hospital encounter of 02/10/19 (from the past 24 hour(s))  Basic metabolic panel     Status: Abnormal   Collection Time: 02/10/19  8:51 PM  Result Value Ref Range   Sodium 139 135 - 145 mmol/L   Potassium 3.7 3.5 - 5.1 mmol/L   Chloride 106 98 - 111 mmol/L   CO2 22 22 - 32 mmol/L   Glucose, Bld 118 (H) 70 - 99 mg/dL   BUN 19  6 - 20 mg/dL   Creatinine, Ser 0.90 0.44 - 1.00 mg/dL   Calcium 9.8 8.9 - 10.3 mg/dL   GFR calc non Af Amer >60 >60 mL/min   GFR calc Af Amer >60 >60 mL/min   Anion gap 11 5 - 15  CBC     Status: Abnormal   Collection Time: 02/10/19  8:51 PM  Result Value Ref Range   WBC 6.3 4.0 - 10.5 K/uL   RBC 5.20 (H) 3.87 - 5.11 MIL/uL   Hemoglobin 14.1 12.0 - 15.0 g/dL   HCT 43.9 36.0 - 46.0 %   MCV 84.4 80.0 - 100.0 fL   MCH 27.1 26.0 - 34.0 pg   MCHC 32.1 30.0 - 36.0 g/dL   RDW 15.2 11.5 - 15.5 %   Platelets 308 150 - 400 K/uL   nRBC 0.0 0.0 - 0.2 %   Troponin I (High Sensitivity)     Status: Abnormal   Collection Time: 02/10/19  8:51 PM  Result Value Ref Range   Troponin I (High Sensitivity) 104 (HH) <18 ng/L  I-Stat beta hCG blood, ED     Status: None   Collection Time: 02/10/19  8:57 PM  Result Value Ref Range   I-stat hCG, quantitative <5.0 <5 mIU/mL   Comment 3           Dg Chest 2 View  Result Date: 02/10/2019 CLINICAL DATA:  Chest pain, hyperventilation EXAM: CHEST - 2 VIEW COMPARISON:  09/27/2018 FINDINGS: The heart size and mediastinal contours are within normal limits. Both lungs are clear. Disc degenerative disease of the thoracic spine. IMPRESSION: No acute abnormality of the lungs. Electronically Signed   By: Eddie Candle M.D.   On: 02/10/2019 21:30    ASSESSMENT: In summary, Lisa Poole is a 53 y.o. female w history of chest pain, MINOCA, DM, HTN, aortic insufficiency presenting with chest pain, found to have elevated troponin and new complete heart block.   Regarding the patient's elevated troponin, it is unusual that she is now presenting with similar symptoms to what she experienced several months ago at the time of her cardiac catheterization, which revealed no coronary artery disease.  It seems unlikely that she has developed an obstructive coronary lesion within the past 5 months, however it is possible that she has experienced rupture of a previously unrecognized nonobstructive plaque causing her symptoms.  Perhaps more likely, her symptoms may be due to coronary vasospasm.  Although microvascular dysfunction may cause chest discomfort and even MINOCA, it seems unlikely that her symptoms would improve over the past several months and then suddenly worsen over the past 1 to 2 weeks.  For treatment of her chest pain and elevated troponin, I have started her on a heparin drip, which may be able to be discontinued tomorrow pending the progression of her symptoms and her troponin trend.  Another somewhat unusual finding is  her new diagnosis of complete heart block.  Fortunately she has what appears to be a stable junctional escape rhythm at a rate of approximately 50 bpm and a normal blood pressure.  She thus has no indication for atropine or transvenous pacing.  As to the etiology of her heart block, she is quite young for common degenerative conduction system disease.  To my knowledge she has no tick exposures and has not recently had any signs or symptoms consistent with myocarditis.  If other infiltrative etiologies are suspected, a cardiac MRI may be warranted.  Alternatively, one may envision a  scenario in which she is experiencing coronary vasospasm as described above which has caused an ischemic insult to her AV node.  This would hopefully improve once her symptoms resolve.  PLAN/DISCUSSION: #) NSTEMI: last cardiac cath in May 2020 with no coronary disease.  Diagnostics: - repeat troponin q6h x 2 - echo in AM - check lipids, [employer]: - ASA 324mg  then 81mg  daily - heparin drip for ACS per pharmacy protocol - cont home lovastatin - cont home losartan, HCTZ - aggressive nitroglycerin treatment for presumed coronary vasospasm - consider starting calcium channel blocker or long acting nitrate in AM for possible coronary vasopasm  #) Complete heart block: currently with stable junctional escape of 50bpm - monitor for now - atropine for hypotension - may consider cardiac MRI to rule out invasive etiologies - EP eval in AM  #) Back pain: given new onset, association with chest pain, and lack of association with movement, recommended CTA to rule out aortic dissection - CTA of aorta to rule out dissection  #) DM - cont home canagliflozin and metformin  #) HTN - home meds as per above  Marcie Mowers, MD Cardiology Fellow, PGY-7

## 2019-02-10 NOTE — ED Provider Notes (Signed)
[facility] EMERGENCY DEPARTMENT Provider Note   CSN: [id number] Arrival date & time: 02/10/19  2028     History   Chief Complaint Chief Complaint  Patient presents with  . Chest Pain    HPI LEKECIA UFFORD is a 53 y.o. female.     Patient is a 53 year old female with a history of diabetes, hypertension and hyperlipidemia who presents with chest pain.  She started having some heaviness across her chest about 3 to 4 hours prior to arrival.  She says it radiates to her back.  It is worse with exertion.  She has associated shortness of breath.  She was admitted in May of this year for an end STEMI and had a cardiac catheterization which showed fairly normal coronary arteries.     Past Medical History:  Diagnosis Date  . Aortic insufficiency   . Asthma 09/13/2006  . Diabetes mellitus without complication ([place])   . Hypercholesteremia   . Hypertension     Patient Active Problem List   Diagnosis Date Noted  . Cystocele without uterine prolapse 12/12/2018  . Elevated troponin level   . Chest discomfort   . NSTEMI (non-ST elevated myocardial infarction) ([place]) 09/27/2018  . Primary osteoarthritis of left knee 12/10/2017  . S/P knee replacement 12/10/2017  . Acromioclavicular arthrosis 08/01/2013  . Aortic insufficiency   . Dry eye syndrome 11/25/2007  . Asthma 09/13/2006  . Diabetes mellitus ([place]) 09/13/2006  . HTN (hypertension) 09/13/2006    Past Surgical History:  Procedure Laterality Date  . APPENDECTOMY    . LEFT HEART CATH AND CORONARY ANGIOGRAPHY N/A 09/28/2018   Procedure: LEFT HEART CATH AND CORONARY ANGIOGRAPHY;  Surgeon: Troy Sine, MD;  Location: [facility];  Service: Cardiovascular;  Laterality: N/A;  . ROTATOR CUFF REPAIR Bilateral 2017  . SINUS EXPLORATION    . TOTAL KNEE ARTHROPLASTY Left 12/10/2017   Procedure: LEFT TOTAL KNEE ARTHROPLASTY;  Surgeon: Sydnee Cabal, MD;  Location: [facility];  Service: Orthopedics;  Laterality:  Left;  Adductor Block  . TUBAL LIGATION       OB History    Gravida  5   Para  4   Term  1   Preterm  3   AB  1   Living  4     SAB  1   TAB  0   Ectopic  0   Multiple  0   Live Births  4            Home Medications    Prior to Admission medications   Medication Sig Start Date End Date Taking? Authorizing Provider  albuterol (PROVENTIL HFA;VENTOLIN HFA) 108 (90 Base) MCG/ACT inhaler Inhale 1-2 puffs into the lungs every 6 (six) hours as needed for wheezing or shortness of breath. 07/30/18  Yes Drenda Freeze, MD  aspirin 81 MG chewable tablet Chew 81 mg by mouth at bedtime.    Yes [provider]  Cholecalciferol (VITAMIN D-3) 25 MCG (1000 UT) CAPS Take 1,000-2,000 Units by mouth daily.   Yes [provider]  gabapentin (NEURONTIN) 600 MG tablet Take 600 mg by mouth 2 (two) times daily.   Yes [provider]  hydrochlorothiazide (MICROZIDE) 12.5 MG capsule Take 12.5 mg by mouth daily.   Yes [provider]  HYDROcodone-acetaminophen (NORCO) 10-325 MG tablet Take 1 tablet by mouth 2 (two) times daily.    Yes [provider]  losartan (COZAAR) 25 MG tablet Take 0.5 tablets (12.5  mg total) by mouth daily. 09/29/18  Yes Reino Bellis B, NP  lovastatin (MEVACOR) 10 MG tablet Take 10 mg by mouth at bedtime.    Yes [provider]  ciprofloxacin (CIPRO) 500 MG tablet Take 1 tablet (500 mg total) by mouth 2 (two) times daily. One po bid x 7 days Patient not taking: Reported on 02/10/2019 12/23/18   Jacqlyn Larsen, PA-C  INVOKAMET XR 50-500 MG TB24 Take 1 tablet by mouth 2 (two) times daily. 12/12/15   [provider]  metroNIDAZOLE (FLAGYL) 500 MG tablet Take 1 tablet (500 mg total) by mouth 2 (two) times daily. One po bid x 7 days Patient not taking: Reported on 02/10/2019 12/23/18   Jacqlyn Larsen, PA-C    Family History Family History  Problem Relation Age of Onset  . Colon cancer Maternal Grandmother    . Alzheimer's disease Mother     Social History Social History   Tobacco Use  . Smoking status: Never Smoker  . Smokeless tobacco: Never Used  Substance Use Topics  . Alcohol use: No  . Drug use: No     Allergies   Lisinopril, Ace inhibitors, Augmentin [amoxicillin-pot clavulanate], Benazepril, Lotensin [benazepril hcl], Oxycodone-acetaminophen, and Sulfa antibiotics   Review of Systems Review of Systems  Constitutional: Negative for chills, diaphoresis, fatigue and fever.  HENT: Negative for congestion, rhinorrhea and sneezing.   Eyes: Negative.   Respiratory: Positive for chest tightness and shortness of breath. Negative for cough.   Cardiovascular: Positive for chest pain. Negative for leg swelling.  Gastrointestinal: Negative for abdominal pain, blood in stool, diarrhea, nausea and vomiting.  Genitourinary: Negative for difficulty urinating, flank pain, frequency and hematuria.  Musculoskeletal: Negative for arthralgias and back pain.  Skin: Negative for rash.  Neurological: Negative for dizziness, speech difficulty, weakness, numbness and headaches.     Physical Exam Updated Vital Signs BP (!) 137/117   Pulse (!) 51   Temp 98.2 F (36.8 C)   Resp 16   Ht 5' (1.524 m)   Wt 78.9 kg   LMP 07/25/2015 (Exact Date)   SpO2 100%   BMI 33.98 kg/m   Physical Exam Constitutional:      Appearance: She is well-developed.     Comments: Looks uncomfortable  HENT:     Head: Normocephalic and atraumatic.  Eyes:     Pupils: Pupils are equal, round, and reactive to light.  Neck:     Musculoskeletal: Normal range of motion and neck supple.  Cardiovascular:     Rate and Rhythm: Regular rhythm. Bradycardia present.     Heart sounds: Normal heart sounds.  Pulmonary:     Effort: Pulmonary effort is normal. No respiratory distress.     Breath sounds: Normal breath sounds. No wheezing or rales.  Chest:     Chest wall: No tenderness.  Abdominal:     General: Bowel sounds  are normal.     Palpations: Abdomen is soft.     Tenderness: There is no abdominal tenderness. There is no guarding or rebound.  Musculoskeletal: Normal range of motion.  Lymphadenopathy:     Cervical: No cervical adenopathy.  Skin:    General: Skin is warm and dry.     Findings: No rash.  Neurological:     Mental Status: She is alert and oriented to person, place, and time.      ED Treatments / Results  Labs (all labs ordered are listed, but only abnormal results are displayed) Labs Reviewed  BASIC METABOLIC PANEL - Abnormal; Notable for the following components:      Result Value   Glucose, Bld 118 (*)    All other components within normal limits  CBC - Abnormal; Notable for the following components:   RBC 5.20 (*)    All other components within normal limits  TROPONIN I (HIGH SENSITIVITY) - Abnormal; Notable for the following components:   Troponin I (High Sensitivity) 104 (*)    All other components within normal limits  TROPONIN I (HIGH SENSITIVITY) - Abnormal; Notable for the following components:   Troponin I (High Sensitivity) 113 (*)    All other components within normal limits  SARS CORONAVIRUS 2 (HOSPITAL ORDER, [name])  HEPARIN LEVEL (UNFRACTIONATED)  I-STAT BETA HCG BLOOD, ED ([facility], [facility], [facility] ONLY)    EKG EKG Interpretation  Date/Time:  Friday February 10 2019 23:48:16 EDT Ventricular Rate:  50 PR Interval:    QRS Duration: 143 QT Interval:  501 QTC Calculation: 457 R Axis:   134 Text Interpretation:  Complete AV block with wide QRS complex Nonspecific intraventricular conduction delay RIGHT SIDE EKG Confirmed by Malvin Johns [phone]) on 02/10/2019 11:52:54 PM   Radiology Dg Chest 2 View  Result Date: 02/10/2019 CLINICAL DATA:  Chest pain, hyperventilation EXAM: CHEST - 2 VIEW COMPARISON:  09/27/2018 FINDINGS: The heart size and mediastinal contours are within normal limits. Both lungs are clear. Disc degenerative disease of  the thoracic spine. IMPRESSION: No acute abnormality of the lungs. Electronically Signed   By: Eddie Candle M.D.   On: 02/10/2019 21:30    Procedures Procedures (including critical care time)  Medications Ordered in ED Medications  nitroGLYCERIN 50 mg in dextrose 5 % 250 mL (0.2 mg/mL) infusion (5 mcg/min Intravenous New Bag/Given 02/10/19 2321)  heparin bolus via infusion 3,500 Units (has no administration in time range)  heparin ADULT infusion 100 units/mL (25000 units/257mL sodium chloride 0.45%) (has no administration in time range)  sodium chloride flush (NS) 0.9 % injection 3 mL (3 mLs Intravenous Given 02/10/19 2258)     Initial Impression / Assessment and Plan / ED Course  I have reviewed the triage vital signs and the nursing notes.  Pertinent labs & imaging results that were available during my care of the patient were reviewed by me and considered in my medical decision making (see chart for details).        22:55 EKG was obtained which shows complete heart block.  I do not see any ST elevation.  I spoke with cardiology who will come see the patient.  He is okay starting nitroglycerin and recommends heparin.  Her blood pressure has been stable.  Her troponin is elevated.  Her other labs are non-concerning.  Chest x-ray is clear.  Patient is to be admitted to cardiology.  We will go ahead and do a CT scan of her chest to rule out aortic dissection given that her pain does radiate to her back.  This is still pending and will be followed up by Dr. Christy Gentles.  I have asked to hold off on the heparin until after her CT scan is done.  CRITICAL CARE Performed by: Malvin Johns Total critical care time: 45 minutes Critical care time was exclusive of separately billable procedures and treating other patients. Critical care was necessary to treat or prevent imminent or life-threatening deterioration. Critical care was time spent personally by me on the following activities: development  of treatment plan with patient and/or surrogate  as well as nursing, discussions with consultants, evaluation of patient's response to treatment, examination of patient, obtaining history from patient or surrogate, ordering and performing treatments and interventions, ordering and review of laboratory studies, ordering and review of radiographic studies, pulse oximetry and re-evaluation of patient's condition.   Final Clinical Impressions(s) / ED Diagnoses   Final diagnoses:  None    ED Discharge Orders    None       Malvin Johns, MD 02/11/19 0004

## 2019-02-10 NOTE — ED Triage Notes (Signed)
The pt is c/o chest pain and sob for several days  She is currently being worked up for the same  She is currently hyperventilating

## 2019-02-10 NOTE — Progress Notes (Signed)
ANTICOAGULATION CONSULT NOTE - Initial Consult  Pharmacy Consult for heparin Indication: chest pain/ACS  Allergies  Allergen Reactions  . Lisinopril Other (See Comments) and Cough    Flu like symptoms, also   . Ace Inhibitors Other (See Comments)    Flu-like symptoms  . Augmentin [Amoxicillin-Pot Clavulanate] Nausea And Vomiting  . Benazepril Other (See Comments)    Flu like symptoms  . Lotensin [Benazepril Hcl] Other (See Comments)    Flu like symptoms  . Oxycodone-Acetaminophen Nausea And Vomiting  . Sulfa Antibiotics Nausea And Vomiting    Patient Measurements: Height: 5' (152.4 cm) Weight: 174 lb (78.9 kg) IBW/kg (Calculated) : 45.5 Heparin Dosing Weight: 63.5 kg  Vital Signs: Temp: 98.2 F (36.8 C) (10/02 2045) BP: 137/117 (10/02 2300) Pulse Rate: 51 (10/02 2300)  Labs: Recent Labs    02/10/19 2051  HGB 14.1  HCT 43.9  PLT 308  CREATININE 0.90  TROPONINIHS 104*    Estimated Creatinine Clearance: 66.4 mL/min (by C-G formula based on SCr of 0.9 mg/dL).   Medical History: Past Medical History:  Diagnosis Date  . Aortic insufficiency   . Asthma 09/13/2006  . Diabetes mellitus without complication ([place])   . Hypercholesteremia   . Hypertension     Medications:  See medication history  Assessment: 53 yo lady with CP to start heparin.  She was not on anticoagulation PTA. Goal of Therapy:  Heparin level 0.3-0.7 units/ml Monitor platelets by anticoagulation protocol: Yes   Plan:  Heparin 3500 unit bolus and drip at 750 units/hr Check heparin level 6 hours after start Daily HL and CBC while on heparin Monitor for bleeding complications  Magali Bray Poteet 02/10/2019,11:40 PM

## 2019-02-11 ENCOUNTER — Encounter (HOSPITAL_COMMUNITY): Payer: Self-pay | Admitting: Radiology

## 2019-02-11 ENCOUNTER — Emergency Department (HOSPITAL_COMMUNITY): Payer: Medicare Other

## 2019-02-11 DIAGNOSIS — Z96652 Presence of left artificial knee joint: Secondary | ICD-10-CM | POA: Diagnosis present

## 2019-02-11 DIAGNOSIS — E119 Type 2 diabetes mellitus without complications: Secondary | ICD-10-CM | POA: Diagnosis present

## 2019-02-11 DIAGNOSIS — Z79891 Long term (current) use of opiate analgesic: Secondary | ICD-10-CM | POA: Diagnosis not present

## 2019-02-11 DIAGNOSIS — Z7982 Long term (current) use of aspirin: Secondary | ICD-10-CM | POA: Diagnosis not present

## 2019-02-11 DIAGNOSIS — Z23 Encounter for immunization: Secondary | ICD-10-CM | POA: Diagnosis present

## 2019-02-11 DIAGNOSIS — Z800 Family history of malignant neoplasm of digestive organs: Secondary | ICD-10-CM | POA: Diagnosis not present

## 2019-02-11 DIAGNOSIS — Z882 Allergy status to sulfonamides status: Secondary | ICD-10-CM | POA: Diagnosis not present

## 2019-02-11 DIAGNOSIS — J45909 Unspecified asthma, uncomplicated: Secondary | ICD-10-CM | POA: Diagnosis present

## 2019-02-11 DIAGNOSIS — I351 Nonrheumatic aortic (valve) insufficiency: Secondary | ICD-10-CM | POA: Diagnosis present

## 2019-02-11 DIAGNOSIS — E7800 Pure hypercholesterolemia, unspecified: Secondary | ICD-10-CM | POA: Diagnosis present

## 2019-02-11 DIAGNOSIS — I442 Atrioventricular block, complete: Principal | ICD-10-CM | POA: Diagnosis present

## 2019-02-11 DIAGNOSIS — I10 Essential (primary) hypertension: Secondary | ICD-10-CM | POA: Diagnosis present

## 2019-02-11 DIAGNOSIS — Z79899 Other long term (current) drug therapy: Secondary | ICD-10-CM | POA: Diagnosis not present

## 2019-02-11 DIAGNOSIS — I4510 Unspecified right bundle-branch block: Secondary | ICD-10-CM | POA: Diagnosis present

## 2019-02-11 DIAGNOSIS — Z888 Allergy status to other drugs, medicaments and biological substances status: Secondary | ICD-10-CM | POA: Diagnosis not present

## 2019-02-11 DIAGNOSIS — Z820 Family history of epilepsy and other diseases of the nervous system: Secondary | ICD-10-CM | POA: Diagnosis not present

## 2019-02-11 DIAGNOSIS — E876 Hypokalemia: Secondary | ICD-10-CM | POA: Diagnosis present

## 2019-02-11 DIAGNOSIS — I252 Old myocardial infarction: Secondary | ICD-10-CM | POA: Diagnosis not present

## 2019-02-11 DIAGNOSIS — I459 Conduction disorder, unspecified: Secondary | ICD-10-CM | POA: Diagnosis present

## 2019-02-11 DIAGNOSIS — Z20828 Contact with and (suspected) exposure to other viral communicable diseases: Secondary | ICD-10-CM | POA: Diagnosis present

## 2019-02-11 DIAGNOSIS — E785 Hyperlipidemia, unspecified: Secondary | ICD-10-CM | POA: Diagnosis present

## 2019-02-11 DIAGNOSIS — Z885 Allergy status to narcotic agent status: Secondary | ICD-10-CM | POA: Diagnosis not present

## 2019-02-11 HISTORY — DX: Atrioventricular block, complete: I44.2

## 2019-02-11 LAB — CBC
HCT: 43.1 % (ref 36.0–46.0)
Hemoglobin: 13.6 g/dL (ref 12.0–15.0)
MCH: 26.5 pg (ref 26.0–34.0)
MCHC: 31.6 g/dL (ref 30.0–36.0)
MCV: 83.9 fL (ref 80.0–100.0)
Platelets: 290 10*3/uL (ref 150–400)
RBC: 5.14 MIL/uL — ABNORMAL HIGH (ref 3.87–5.11)
RDW: 15.4 % (ref 11.5–15.5)
WBC: 5.9 10*3/uL (ref 4.0–10.5)
nRBC: 0 % (ref 0.0–0.2)

## 2019-02-11 LAB — BASIC METABOLIC PANEL
Anion gap: 9 (ref 5–15)
BUN: 16 mg/dL (ref 6–20)
CO2: 21 mmol/L — ABNORMAL LOW (ref 22–32)
Calcium: 8.5 mg/dL — ABNORMAL LOW (ref 8.9–10.3)
Chloride: 107 mmol/L (ref 98–111)
Creatinine, Ser: 0.77 mg/dL (ref 0.44–1.00)
GFR calc Af Amer: >60 mL/min (ref >60)
GFR calc non Af Amer: >60 mL/min (ref >60)
Glucose, Bld: 134 mg/dL — ABNORMAL HIGH (ref 70–99)
Potassium: 3.3 mmol/L — ABNORMAL LOW (ref 3.5–5.1)
Sodium: 137 mmol/L (ref 135–145)

## 2019-02-11 LAB — SARS CORONAVIRUS 2 BY RT PCR (HOSPITAL ORDER, PERFORMED IN ~~LOC~~ HOSPITAL LAB): SARS Coronavirus 2: NEGATIVE

## 2019-02-11 LAB — GLUCOSE, CAPILLARY: Glucose-Capillary: 111 mg/dL — ABNORMAL HIGH (ref 70–99)

## 2019-02-11 LAB — MRSA PCR SCREENING: MRSA by PCR: NEGATIVE

## 2019-02-11 LAB — HEPARIN LEVEL (UNFRACTIONATED)
Heparin Unfractionated: 0.34 IU/mL (ref 0.30–0.70)
Heparin Unfractionated: 0.26 IU/mL — ABNORMAL LOW (ref 0.30–0.70)

## 2019-02-11 MED ORDER — GABAPENTIN 600 MG PO TABS
600.0000 mg | ORAL_TABLET | Freq: Two times a day (BID) | ORAL | Status: DC
  Administered 2019-02-11 – 2019-02-14 (×6): 600 mg via ORAL
  Filled 2019-02-11 (×7): qty 1

## 2019-02-11 MED ORDER — IOHEXOL 350 MG/ML SOLN
100.0000 mL | Freq: Once | INTRAVENOUS | Status: AC | PRN
  Administered 2019-02-11: 100 mL via INTRAVENOUS

## 2019-02-11 MED ORDER — ONDANSETRON HCL 4 MG/2ML IJ SOLN: 4.0000 mg | Freq: Four times a day (QID) | INTRAMUSCULAR | Status: DC | PRN

## 2019-02-11 MED ORDER — LOSARTAN POTASSIUM 25 MG PO TABS
12.5000 mg | ORAL_TABLET | Freq: Every day | ORAL | Status: DC
  Administered 2019-02-11 – 2019-02-14 (×3): 12.5 mg via ORAL
  Filled 2019-02-11 (×4): qty 1

## 2019-02-11 MED ORDER — NITROGLYCERIN 0.4 MG SL SUBL: 0.4000 mg | SUBLINGUAL_TABLET | SUBLINGUAL | Status: DC | PRN

## 2019-02-11 MED ORDER — ASPIRIN 81 MG PO CHEW
81.0000 mg | CHEWABLE_TABLET | Freq: Every day | ORAL | Status: DC
  Administered 2019-02-11 – 2019-02-13 (×3): 81 mg via ORAL
  Filled 2019-02-11 (×3): qty 1

## 2019-02-11 MED ORDER — VITAMIN D3 25 MCG (1000 UNIT) PO TABS
1000.0000 [IU] | ORAL_TABLET | Freq: Every day | ORAL | Status: DC
  Administered 2019-02-12 – 2019-02-14 (×2): 1000 [IU] via ORAL
  Filled 2019-02-11 (×7): qty 1

## 2019-02-11 MED ORDER — METFORMIN HCL 500 MG PO TABS
500.0000 mg | ORAL_TABLET | Freq: Two times a day (BID) | ORAL | Status: DC
  Administered 2019-02-11 – 2019-02-14 (×5): 500 mg via ORAL
  Filled 2019-02-11 (×5): qty 1

## 2019-02-11 MED ORDER — CANAGLIFLOZIN-METFORMIN HCL ER 50-500 MG PO TB24: 1.0000 | ORAL_TABLET | Freq: Two times a day (BID) | ORAL | Status: DC

## 2019-02-11 MED ORDER — INFLUENZA VAC SPLIT QUAD 0.5 ML IM SUSY
0.5000 mL | PREFILLED_SYRINGE | INTRAMUSCULAR | Status: AC
  Administered 2019-02-12: 09:00:00 0.5 mL via INTRAMUSCULAR
  Filled 2019-02-11: qty 0.5

## 2019-02-11 MED ORDER — HYDROCHLOROTHIAZIDE 12.5 MG PO CAPS
12.5000 mg | ORAL_CAPSULE | Freq: Every day | ORAL | Status: DC
  Administered 2019-02-11 – 2019-02-14 (×3): 12.5 mg via ORAL
  Filled 2019-02-11 (×4): qty 1

## 2019-02-11 MED ORDER — ALBUTEROL SULFATE (2.5 MG/3ML) 0.083% IN NEBU: 3.0000 mL | INHALATION_SOLUTION | Freq: Four times a day (QID) | RESPIRATORY_TRACT | Status: DC | PRN

## 2019-02-11 MED ORDER — HYDROCODONE-ACETAMINOPHEN 10-325 MG PO TABS
1.0000 | ORAL_TABLET | Freq: Two times a day (BID) | ORAL | Status: DC
  Administered 2019-02-11 – 2019-02-14 (×8): 1 via ORAL
  Filled 2019-02-11 (×9): qty 1

## 2019-02-11 MED ORDER — POTASSIUM CHLORIDE CRYS ER 20 MEQ PO TBCR
40.0000 meq | EXTENDED_RELEASE_TABLET | Freq: Once | ORAL | Status: AC
  Administered 2019-02-11: 11:00:00 40 meq via ORAL
  Filled 2019-02-11: qty 2

## 2019-02-11 NOTE — ED Provider Notes (Signed)
CT dissection study is negative. Patient will be admitted to the cardiology service. BP (!) 147/80 (BP Location: Right Arm)   Pulse (!) 48   Temp 98.2 F (36.8 C) (Oral)   Resp 20   Ht 1.524 m (5')   Wt 78.9 kg   LMP 07/25/2015 (Exact Date)   SpO2 92%   BMI 33.98 kg/m     Ripley Fraise, MD 02/11/19 0114

## 2019-02-11 NOTE — ED Notes (Signed)
Ordered breakfast tray  

## 2019-02-11 NOTE — ED Notes (Addendum)
ED TO INPATIENT HANDOFF REPORT  ED Nurse Name and Phone #: Thurmond Butts [place] Name/Age/Gender Lisa Poole 53 y.o. female Room/Bed: 011C/011C  Code Status   Code Status: Prior  Home/SNF/Other Home Patient oriented to: self, place, time and situation Is this baseline? Yes   Triage Complete: Triage complete  Chief Complaint chest pain and sob  Triage Note The pt is c/o chest pain and sob for several days  She is currently being worked up for the same  She is currently hyperventilating     Allergies Allergies  Allergen Reactions  . Lisinopril Other (See Comments) and Cough    Flu like symptoms, also   . Ace Inhibitors Other (See Comments)    Flu-like symptoms  . Augmentin [Amoxicillin-Pot Clavulanate] Nausea And Vomiting  . Benazepril Other (See Comments)    Flu like symptoms  . Lotensin [Benazepril Hcl] Other (See Comments)    Flu like symptoms  . Oxycodone-Acetaminophen Nausea And Vomiting  . Sulfa Antibiotics Nausea And Vomiting    Level of Care/Admitting Diagnosis ED Disposition    ED Disposition Condition [facility] Area: [place] [100100]  Level of Care: Progressive [102]  Covid Evaluation: Confirmed COVID Negative  Diagnosis: [facility] block [id number]  Admitting Physician: Deneen Harts  Attending Physician: Constance Haw [phone]  Estimated length of stay: past midnight tomorrow  Certification:: I certify this patient will need inpatient services for at least 2 midnights  PT Class (Do Not Modify): Inpatient [101]  PT Acc Code (Do Not Modify): Private [1]       B Medical/Surgery History Past Medical History:  Diagnosis Date  . Aortic insufficiency   . Asthma 09/13/2006  . Diabetes mellitus without complication ([place])   . Hypercholesteremia   . Hypertension    Past Surgical History:  Procedure Laterality Date  . APPENDECTOMY    . LEFT HEART CATH AND CORONARY ANGIOGRAPHY N/A  09/28/2018   Procedure: LEFT HEART CATH AND CORONARY ANGIOGRAPHY;  Surgeon: Troy Sine, MD;  Location: [facility];  Service: Cardiovascular;  Laterality: N/A;  . ROTATOR CUFF REPAIR Bilateral 2017  . SINUS EXPLORATION    . TOTAL KNEE ARTHROPLASTY Left 12/10/2017   Procedure: LEFT TOTAL KNEE ARTHROPLASTY;  Surgeon: Sydnee Cabal, MD;  Location: [facility];  Service: Orthopedics;  Laterality: Left;  Adductor Block  . TUBAL LIGATION       A IV Location/Drains/Wounds Patient Lines/Drains/Airways Status   Active Line/Drains/Airways    Name:   Placement date:   Placement time:   Site:   Days:   Peripheral IV 02/10/19 Left Forearm   02/10/19    2304    Forearm   1   Peripheral IV 02/11/19 Right Forearm   02/11/19    0005    Forearm   less than 1   Incision (Closed) 12/10/17 Knee Left   12/10/17    1617     428   Wound 10/15/12 Laceration Finger (Comment which one) Left   10/15/12    1702    Finger (Comment which one)   2310          Intake/Output Last 24 hours No intake or output data in the 24 hours ending 02/11/19 0935  Labs/Imaging Results for orders placed or performed during the hospital encounter of 02/10/19 (from the past 48 hour(s))  Basic metabolic panel     Status: Abnormal   Collection Time: 02/10/19  8:51  PM  Result Value Ref Range   Sodium 139 135 - 145 mmol/L   Potassium 3.7 3.5 - 5.1 mmol/L   Chloride 106 98 - 111 mmol/L   CO2 22 22 - 32 mmol/L   Glucose, Bld 118 (H) 70 - 99 mg/dL   BUN 19 6 - 20 mg/dL   Creatinine, Ser 0.90 0.44 - 1.00 mg/dL   Calcium 9.8 8.9 - 10.3 mg/dL   GFR calc non Af Amer >60 >60 mL/min   GFR calc Af Amer >60 >60 mL/min   Anion gap 11 5 - 15    Comment: Performed at [facility] [address], [place], [place] [address]  CBC     Status: Abnormal   Collection Time: 02/10/19  8:51 PM  Result Value Ref Range   WBC 6.3 4.0 - 10.5 K/uL   RBC 5.20 (H) 3.87 - 5.11 MIL/uL   Hemoglobin 14.1 12.0 - 15.0 g/dL   HCT 43.9 36.0 - 46.0  %   MCV 84.4 80.0 - 100.0 fL   MCH 27.1 26.0 - 34.0 pg   MCHC 32.1 30.0 - 36.0 g/dL   RDW 15.2 11.5 - 15.5 %   Platelets 308 150 - 400 K/uL   nRBC 0.0 0.0 - 0.2 %    Comment: Performed at [facility], [place] [address] [place], [place] [address]  Troponin I (High Sensitivity)     Status: Abnormal   Collection Time: 02/10/19  8:51 PM  Result Value Ref Range   Troponin I (High Sensitivity) 104 (HH) <18 ng/L    Comment: CRITICAL RESULT CALLED TO, READ BACK BY AND VERIFIED WITH: NEWNAM,K RN 02/10/2019 2142 JORDANS (NOTE) Elevated high sensitivity troponin I (hsTnI) values and significant  changes across serial measurements may suggest ACS but many other  chronic and acute conditions are known to elevate hsTnI results.  Refer to the Links section for chest pain algorithms and additional  guidance. Performed at [facility], [place] [address] [place], [place] [address]   I-Stat beta hCG blood, ED     Status: None   Collection Time: 02/10/19  8:57 PM  Result Value Ref Range   I-stat hCG, quantitative <5.0 <5 mIU/mL   Comment 3            Comment:   GEST. AGE      CONC.  (mIU/mL)   <=1 WEEK        5 - 50     2 WEEKS       50 - 500     3 WEEKS       100 - 10,000     4 WEEKS     1,000 - 30,000        FEMALE AND NON-PREGNANT FEMALE:     LESS THAN 5 mIU/mL   Troponin I (High Sensitivity)     Status: Abnormal   Collection Time: 02/10/19 10:49 PM  Result Value Ref Range   Troponin I (High Sensitivity) 113 (HH) <18 ng/L    Comment: CRITICAL VALUE NOTED.  VALUE IS CONSISTENT WITH PREVIOUSLY REPORTED AND CALLED VALUE. (NOTE) Elevated high sensitivity troponin I (hsTnI) values and significant  changes across serial measurements may suggest ACS but many other  chronic and acute conditions are known to elevate hsTnI results.  Refer to the Links section for chest pain algorithms and additional  guidance. Performed at [facility], [place] [address], [place], [place] [address]  SARS Coronavirus 2 [facility] order, Performed in [facility] hospital lab) Nasopharyngeal Nasopharyngeal Swab     Status: None   Collection Time: 02/11/19 12:08 AM   Specimen: Nasopharyngeal Swab  Result Value Ref Range   SARS Coronavirus 2 NEGATIVE NEGATIVE    Comment: (NOTE) If result is NEGATIVE SARS-CoV-2 target nucleic acids are NOT DETECTED. The SARS-CoV-2 RNA is generally detectable in upper and lower  respiratory specimens during the acute phase of infection. The lowest  concentration of SARS-CoV-2 viral copies this assay can detect is 250  copies / mL. A negative result does not preclude SARS-CoV-2 infection  and should not be used as the sole basis for treatment or other  patient management decisions.  A negative result may occur with  improper specimen collection / handling, submission of specimen other  than nasopharyngeal swab, presence of viral mutation(s) within the  areas targeted by this assay, and inadequate number of viral copies  (<250 copies / mL). A negative result must be combined with clinical  observations, patient history, and epidemiological information. If result is POSITIVE SARS-CoV-2 target nucleic acids are DETECTED. The SARS-CoV-2 RNA is generally detectable in upper and lower  respiratory specimens dur ing the acute phase of infection.  Positive  results are indicative of active infection with SARS-CoV-2.  Clinical  correlation with patient history and other diagnostic information is  necessary to determine patient infection status.  Positive results do  not rule out bacterial infection or co-infection with other viruses. If result is PRESUMPTIVE POSTIVE SARS-CoV-2 nucleic acids MAY BE PRESENT.   A presumptive positive result was obtained on the submitted specimen  and confirmed on repeat testing.  While 2019 novel coronavirus  (SARS-CoV-2) nucleic acids may be present in the submitted sample  additional confirmatory testing may be necessary for  epidemiological  and / or clinical management purposes  to differentiate between  SARS-CoV-2 and other Sarbecovirus currently known to infect humans.  If clinically indicated additional testing with an alternate test  methodology [phone]) is advised. The SARS-CoV-2 RNA is generally  detectable in upper and lower respiratory sp ecimens during the acute  phase of infection. The expected result is Negative. Fact Sheet for Patients:  StrictlyIdeas.no Fact Sheet for Healthcare Providers: BankingDealers.co.za This test is not yet approved or cleared by the [place] FDA and has been authorized for detection and/or diagnosis of SARS-CoV-2 by FDA under an Emergency Use Authorization (EUA).  This EUA will remain in effect (meaning this test can be used) for the duration of the COVID-19 declaration under Section 564(b)(1) of the Act, 21 U.S.C. section 360bbb-3(b)(1), unless the authorization is terminated or revoked sooner. Performed at [facility], [place] [address] [place], [place] [address]   Heparin level (unfractionated)     Status: None   Collection Time: 02/11/19  5:02 AM  Result Value Ref Range   Heparin Unfractionated 0.34 0.30 - 0.70 IU/mL    Comment: (NOTE) If heparin results are below expected values, and patient dosage has  been confirmed, suggest follow up testing of antithrombin III levels. Performed at [facility], [place] [address] [place], [place] [id number]   Basic metabolic panel     Status: Abnormal   Collection Time: 02/11/19  5:02 AM  Result Value Ref Range   Sodium 137 135 - 145 mmol/L   Potassium 3.3 (L) 3.5 - 5.1 mmol/L   Chloride 107 98 - 111 mmol/L   CO2 21 (L) 22 - 32 mmol/L  Glucose, Bld 134 (H) 70 - 99 mg/dL   BUN 16 6 - 20 mg/dL   Creatinine, Ser 0.77 0.44 - 1.00 mg/dL   Calcium 8.5 (L) 8.9 - 10.3 mg/dL   GFR calc non Af Amer >60 >60 mL/min   GFR calc Af Amer >60 >60 mL/min   Anion gap 9 5  - 15    Comment: Performed at [facility] [address] [place], [place] [address]   Dg Chest 2 View  Result Date: 02/10/2019 CLINICAL DATA:  Chest pain, hyperventilation EXAM: CHEST - 2 VIEW COMPARISON:  09/27/2018 FINDINGS: The heart size and mediastinal contours are within normal limits. Both lungs are clear. Disc degenerative disease of the thoracic spine. IMPRESSION: No acute abnormality of the lungs. Electronically Signed   By: Eddie Candle M.D.   On: 02/10/2019 21:30   Ct Angio Chest/abd/pel For Dissection W And/or W/wo  Result Date: 02/11/2019 CLINICAL DATA:  Chest pain, shortness of breath EXAM: CT ANGIOGRAPHY CHEST, ABDOMEN AND PELVIS TECHNIQUE: Multidetector CT imaging through the chest, abdomen and pelvis was performed using the standard protocol during bolus administration of intravenous contrast. Multiplanar reconstructed images and MIPs were obtained and reviewed to evaluate the vascular anatomy. CONTRAST:  114mL OMNIPAQUE IOHEXOL 350 MG/ML SOLN COMPARISON:  December 23, 2018 FINDINGS: CTA CHEST FINDINGS Cardiovascular: --Heart: The heart size is normal.  There is nopericardial effusion. --Aorta: The course and caliber of the thoracic aorta are normal. There is no aortic atherosclerotic calcification. Precontrast images show no aortic intramural hematoma. There is no blood pool, dissection or penetrating ulcer demonstrated on arterial phase postcontrast imaging. There is a conventional 3 vessel aortic arch branching pattern. The proximal arch vessels are widely patent. --Pulmonary Arteries: Contrast timing is optimized for preferential opacification of the aorta. Within that limitation, normal central pulmonary arteries. Mediastinum/Nodes: Calcified left hilar lymph nodes are seen. Scattered small paratracheal lymph nodes are noted. The visualized thyroid and thoracic esophageal course are unremarkable. Lungs/Pleura: No pulmonary nodules or masses. No pleural effusion or pneumothorax.  No focal airspace consolidation. No focal pleural abnormality. Musculoskeletal: No chest wall abnormality. No acute osseous findings. Review of the MIP images confirms the above findings. CTA ABDOMEN AND PELVIS FINDINGS VASCULAR Aorta:Normal caliber aorta without aneurysm, dissection, vasculitis or hemodynamically significant stenosis. There is no aortic atherosclerosis. Celiac: No aneurysm, dissection or hemodynamically significant stenosis. Normal branching pattern SMA: Widely patent without dissection or stenosis. Renals: Single renal arteries bilaterally. No aneurysm, dissection, stenosis or evidence of fibromuscular dysplasia. IMA: Patent without abnormality. Inflow: No aneurysm, stenosis or dissection. Veins: Normal course and caliber of the major veins. Assessment is otherwise limited by the arterial dominant contrast phase. Review of the MIP images confirms the above findings. NON-VASCULAR Hepatobiliary: Coarse calcifications in the anterior left liver lobe. Normal hepatic contours and density. No visible biliary dilatation. Normal gallbladder. Pancreas: Normal contours without ductal dilatation. No peripancreatic fluid collection. Spleen: Splenic calcifications are seen. Normal arterial phase splenic enhancement pattern. Adrenals/Urinary Tract: --Adrenal glands: Normal. --Right kidney/ureter: No hydronephrosis or perinephric stranding. No nephrolithiasis. No obstructing ureteral stones. --Left kidney/ureter: No hydronephrosis or perinephric stranding. No nephrolithiasis. No obstructing ureteral stones. --Urinary bladder: Unremarkable. Stomach/Bowel: --Stomach/Duodenum: No hiatal hernia or other gastric abnormality. Normal duodenal course and caliber. --Small bowel: No dilatation or inflammation. --Colon: Colonic diverticula is noted without diverticulitis. Lymphatic:  No abdominal or pelvic lymphadenopathy. Reproductive: No free fluid in the pelvis. Musculoskeletal. Again noted are bilateral sacroiliac  sclerotic changes, right greater than left with  vacuum phenomena. Other: None. Review of the MIP images confirms the above findings. IMPRESSION: No acute  aortic abnormality. No acute intrathoracic pathology. Findings suggestive of granulomatous disease within the left hilum, spleen, and liver. Sigmoid colonic diverticulosis without diverticulitis. Electronically Signed   By: Prudencio Pair M.D.   On: 02/11/2019 01:04    Pending Labs Unresulted Labs (From admission, onward)    Start     Ordered   02/12/19 0500  Heparin level (unfractionated)  Daily,   R     02/10/19 2356   02/12/19 0500  CBC  Daily,   R     02/10/19 2356   02/11/19 1200  Heparin level (unfractionated)  Once-Timed,   STAT     02/11/19 0924   Signed and Held  CBC  Tomorrow morning,   R     Signed and Held          Vitals/Pain Today's Vitals   02/11/19 0700 02/11/19 0715 02/11/19 0730 02/11/19 0800  BP: (!) 103/57 100/67 102/72 96/63  Pulse: (!) 45 (!) 46 (!) 45 (!) 46  Resp: 16 14 15 17   Temp:      TempSrc:      SpO2: 98% 99% 99% 98%  Weight:      Height:      PainSc:        Isolation Precautions No active isolations  Medications Medications  nitroGLYCERIN 50 mg in dextrose 5 % 250 mL (0.2 mg/mL) infusion (5 mcg/min Intravenous New Bag/Given 02/10/19 2321)  heparin ADULT infusion 100 units/mL (25000 units/231mL sodium chloride 0.45%) (750 Units/hr Intravenous New Bag/Given 02/11/19 0055)  HYDROcodone-acetaminophen (NORCO) 10-325 MG per tablet 1 tablet (1 tablet Oral Given 02/11/19 0240)  sodium chloride flush (NS) 0.9 % injection 3 mL (3 mLs Intravenous Given 02/10/19 2258)  heparin bolus via infusion 3,500 Units (3,500 Units Intravenous Bolus from Bag 02/11/19 0057)  iohexol (OMNIPAQUE) 350 MG/ML injection 100 mL (100 mLs Intravenous Contrast Given 02/11/19 0027)    Mobility walks Low fall risk   Focused Assessments    R Recommendations: See Admitting Provider Note  Report given to: Luz Lex  RN  Additional Notes:

## 2019-02-11 NOTE — Consult Note (Signed)
Cardiology Consultation:   Patient ID: Lisa Poole MRN: [id number]; DOB: November 09, 1965  Admit date: 02/10/2019 Date of Consult: 02/11/2019  Primary Care Provider: Milford Cage, PA Primary Cardiologist: Tamala Julian Primary Electrophysiologist:  None    Patient Profile:   Lisa Poole is a 53 y.o. female with a hx of aortic insufficiency, diabetes, hyperlipidemia, hypertension who is being seen today for the evaluation of heart block at the request of Lanna Poche.  History of Present Illness:   Lisa Poole she has been having 1 to 2 weeks of worsening chest discomfort.  She describes a sensation as a pressure in her chest that is pressing on her left central chest.  This is been occurring every day for the last 1 to 2 weeks and has been getting worse.  Her symptoms now are worse than they have been in the past.  She is also been having new back pain which starts in her chest and radiates to the left side of her back.  She has never had anything like this before.  In the emergency room, she was found to be normotensive with a heart rate around 50 and normal oxygen saturations.  ECG showed complete heart block with a junctional escape rhythm at 50 bpm.  High-sensitivity troponin came back at 113.  She was started on nitroglycerin and heparin.  She has had multiple prior admissions for chest pain.  She was seen March 2020 with a negative troponin at the time.  She returned to May 2020 describing similar symptoms.  She had a left heart catheterization which was unrevealing and an echo that she showed a normal ejection fraction and mild to moderate aortic insufficiency.  Her chest pain abated for the next several months until her current presentation.  Heart Pathway Score:     Past Medical History:  Diagnosis Date   Aortic insufficiency    Asthma 09/13/2006   Diabetes mellitus without complication ([place])    Hypercholesteremia    Hypertension     Past Surgical History:  Procedure  Laterality Date   APPENDECTOMY     LEFT HEART CATH AND CORONARY ANGIOGRAPHY N/A 09/28/2018   Procedure: LEFT HEART CATH AND CORONARY ANGIOGRAPHY;  Surgeon: Troy Sine, MD;  Location: [facility];  Service: Cardiovascular;  Laterality: N/A;   ROTATOR CUFF REPAIR Bilateral 2017   SINUS EXPLORATION     TOTAL KNEE ARTHROPLASTY Left 12/10/2017   Procedure: LEFT TOTAL KNEE ARTHROPLASTY;  Surgeon: Sydnee Cabal, MD;  Location: [facility];  Service: Orthopedics;  Laterality: Left;  Adductor Block   TUBAL LIGATION       Home Medications:  Prior to Admission medications   Medication Sig Start Date End Date Taking? Authorizing Provider  albuterol (PROVENTIL HFA;VENTOLIN HFA) 108 (90 Base) MCG/ACT inhaler Inhale 1-2 puffs into the lungs every 6 (six) hours as needed for wheezing or shortness of breath. 07/30/18  Yes Drenda Freeze, MD  aspirin 81 MG chewable tablet Chew 81 mg by mouth at bedtime.    Yes [provider]  Cholecalciferol (VITAMIN D-3) 25 MCG (1000 UT) CAPS Take 1,000-2,000 Units by mouth daily.   Yes [provider]  gabapentin (NEURONTIN) 600 MG tablet Take 600 mg by mouth 2 (two) times daily.   Yes [provider]  hydrochlorothiazide (MICROZIDE) 12.5 MG capsule Take 12.5 mg by mouth daily.   Yes [provider]  HYDROcodone-acetaminophen (NORCO) 10-325 MG tablet Take 1 tablet by mouth 2 (two) times daily.  Yes [provider]  losartan (COZAAR) 25 MG tablet Take 0.5 tablets (12.5 mg total) by mouth daily. 09/29/18  Yes Reino Bellis B, NP  lovastatin (MEVACOR) 10 MG tablet Take 10 mg by mouth at bedtime.    Yes [provider]  ciprofloxacin (CIPRO) 500 MG tablet Take 1 tablet (500 mg total) by mouth 2 (two) times daily. One po bid x 7 days Patient not taking: Reported on 02/10/2019 12/23/18   Jacqlyn Larsen, PA-C  INVOKAMET XR 50-500 MG TB24 Take 1 tablet by mouth 2 (two) times daily. 12/12/15   [provider]  metroNIDAZOLE (FLAGYL) 500 MG tablet Take 1 tablet (500 mg total) by mouth 2 (two) times daily. One po bid x 7 days Patient not taking: Reported on 02/10/2019 12/23/18   Jacqlyn Larsen, PA-C    Inpatient Medications: Scheduled Meds:  HYDROcodone-acetaminophen  1 tablet Oral BID   Continuous Infusions:  heparin 750 Units/hr (02/11/19 0055)   nitroGLYCERIN 5 mcg/min (02/10/19 2321)   PRN Meds:   Allergies:    Allergies  Allergen Reactions   Lisinopril Other (See Comments) and Cough    Flu like symptoms, also    Ace Inhibitors Other (See Comments)    Flu-like symptoms   Augmentin [Amoxicillin-Pot Clavulanate] Nausea And Vomiting   Benazepril Other (See Comments)    Flu like symptoms   Lotensin [Benazepril Hcl] Other (See Comments)    Flu like symptoms   Oxycodone-Acetaminophen Nausea And Vomiting   Sulfa Antibiotics Nausea And Vomiting    Social History:   Social History   Socioeconomic History   Marital status: Married    Spouse name: Not on file   Number of children: Not on file   Years of education: Not on file   Highest education level: Not on file  Occupational History   Not on file  Social Needs   Financial resource strain: Not on file   Food insecurity    Worry: Not on file    Inability: Not on file   Transportation needs    Medical: Not on file    Non-medical: Not on file  Tobacco Use   Smoking status: Never Smoker   Smokeless tobacco: Never Used  Substance and Sexual Activity   Alcohol use: No   Drug use: No   Sexual activity: Yes    Birth control/protection: Post-menopausal  Lifestyle   Physical activity    Days per week: Not on file    Minutes per session: Not on file   Stress: Not on file  Relationships   Social connections    Talks on phone: Not on file    Gets together: Not on file    Attends religious service: Not on file    Active member of club or organization: Not on file    Attends meetings of clubs or  organizations: Not on file    Relationship status: Not on file   Intimate partner violence    Fear of current or ex partner: Not on file    Emotionally abused: Not on file    Physically abused: Not on file    Forced sexual activity: Not on file  Other Topics Concern   Not on file  Social History Narrative   Not on file    Family History:    Family History  Problem Relation Age of Onset   Colon cancer Maternal Grandmother    Alzheimer's disease Mother      ROS:  Please see  the history of present illness.   All other ROS reviewed and negative.     Physical Exam/Data:   Vitals:   02/11/19 0700 02/11/19 0715 02/11/19 0730 02/11/19 0800  BP: (!) 103/57 100/67 102/72 96/63  Pulse: (!) 45 (!) 46 (!) 45 (!) 46  Resp: 16 14 15 17   Temp:      TempSrc:      SpO2: 98% 99% 99% 98%  Weight:      Height:       No intake or output data in the 24 hours ending 02/11/19 0903 Last 3 Weights 02/11/2019 02/10/2019 12/12/2018  Weight (lbs) 174 lb 174 lb 176 lb  Weight (kg) 78.926 kg 78.926 kg 79.833 kg     Body mass index is 33.98 kg/m.  General:  Well nourished, well developed, in no acute distress HEENT: normal Lymph: no adenopathy Neck: no JVD Endocrine:  No thryomegaly Vascular: No carotid bruits; FA pulses 2+ bilaterally without bruits  Cardiac: Bradycardic, no murmurs Lungs:  clear to auscultation bilaterally, no wheezing, rhonchi or rales  Abd: soft, nontender, no hepatomegaly  Ext: no edema Musculoskeletal:  No deformities, BUE and BLE strength normal and equal Skin: warm and dry  Neuro:  CNs 2-12 intact, no focal abnormalities noted Psych:  Normal affect   EKG:  The EKG was personally reviewed and demonstrates: Sinus rhythm, junctional escape, complete heart block, right bundle branch block Telemetry:  Telemetry was personally reviewed and demonstrates: Complete heart block, right bundle branch block  Relevant CV Studies: TTE 09/28/18  1. The left ventricle has  normal systolic function with an ejection fraction of 60-65%. The cavity size was normal. There is mild concentric left ventricular hypertrophy. Left ventricular diastolic Doppler parameters are consistent with  pseudonormalization. Indeterminate filling pressures.  2. The right ventricle has normal systolic function. The cavity was normal. There is no increase in right ventricular wall thickness.  3. No evidence of mitral valve stenosis.  4. The aortic valve is tricuspid. Mild thickening of the aortic valve. Mild calcification of the aortic valve. Aortic valve regurgitation is mild to moderate by color flow Doppler. No stenosis of the aortic valve.  5. The ascending aorta is normal in size and structure.  LHC 09/28/18  Prox LAD to Mid LAD lesion is 5% stenosed.  The left ventricular ejection fraction is 50-55% by visual estimate.  The left ventricular systolic function is normal.  Laboratory Data:  High Sensitivity Troponin:   Recent Labs  Lab 02/10/19 2051 02/10/19 2249  TROPONINIHS 104* 113*     Chemistry Recent Labs  Lab 02/10/19 2051 02/11/19 0502  NA 139 137  K 3.7 3.3*  CL 106 107  CO2 22 21*  GLUCOSE 118* 134*  BUN 19 16  CREATININE 0.90 0.77  CALCIUM 9.8 8.5*  GFRNONAA >60 >60  GFRAA >60 >60  ANIONGAP 11 9    No results for input(s): PROT, ALBUMIN, AST, ALT, ALKPHOS, BILITOT in the last 168 hours. Hematology Recent Labs  Lab 02/10/19 2051  WBC 6.3  RBC 5.20*  HGB 14.1  HCT 43.9  MCV 84.4  MCH 27.1  MCHC 32.1  RDW 15.2  PLT 308   BNPNo results for input(s): BNP, PROBNP in the last 168 hours.  DDimer No results for input(s): DDIMER in the last 168 hours.   Radiology/Studies:  Dg Chest 2 View  Result Date: 02/10/2019 CLINICAL DATA:  Chest pain, hyperventilation EXAM: CHEST - 2 VIEW COMPARISON:  09/27/2018 FINDINGS: The heart size and  mediastinal contours are within normal limits. Both lungs are clear. Disc degenerative disease of the thoracic spine.  IMPRESSION: No acute abnormality of the lungs. Electronically Signed   By: Eddie Candle M.D.   On: 02/10/2019 21:30   Ct Angio Chest/abd/pel For Dissection W And/or W/wo  Result Date: 02/11/2019 CLINICAL DATA:  Chest pain, shortness of breath EXAM: CT ANGIOGRAPHY CHEST, ABDOMEN AND PELVIS TECHNIQUE: Multidetector CT imaging through the chest, abdomen and pelvis was performed using the standard protocol during bolus administration of intravenous contrast. Multiplanar reconstructed images and MIPs were obtained and reviewed to evaluate the vascular anatomy. CONTRAST:  143mL OMNIPAQUE IOHEXOL 350 MG/ML SOLN COMPARISON:  December 23, 2018 FINDINGS: CTA CHEST FINDINGS Cardiovascular: --Heart: The heart size is normal.  There is nopericardial effusion. --Aorta: The course and caliber of the thoracic aorta are normal. There is no aortic atherosclerotic calcification. Precontrast images show no aortic intramural hematoma. There is no blood pool, dissection or penetrating ulcer demonstrated on arterial phase postcontrast imaging. There is a conventional 3 vessel aortic arch branching pattern. The proximal arch vessels are widely patent. --Pulmonary Arteries: Contrast timing is optimized for preferential opacification of the aorta. Within that limitation, normal central pulmonary arteries. Mediastinum/Nodes: Calcified left hilar lymph nodes are seen. Scattered small paratracheal lymph nodes are noted. The visualized thyroid and thoracic esophageal course are unremarkable. Lungs/Pleura: No pulmonary nodules or masses. No pleural effusion or pneumothorax. No focal airspace consolidation. No focal pleural abnormality. Musculoskeletal: No chest wall abnormality. No acute osseous findings. Review of the MIP images confirms the above findings. CTA ABDOMEN AND PELVIS FINDINGS VASCULAR Aorta:Normal caliber aorta without aneurysm, dissection, vasculitis or hemodynamically significant stenosis. There is no aortic atherosclerosis.  Celiac: No aneurysm, dissection or hemodynamically significant stenosis. Normal branching pattern SMA: Widely patent without dissection or stenosis. Renals: Single renal arteries bilaterally. No aneurysm, dissection, stenosis or evidence of fibromuscular dysplasia. IMA: Patent without abnormality. Inflow: No aneurysm, stenosis or dissection. Veins: Normal course and caliber of the major veins. Assessment is otherwise limited by the arterial dominant contrast phase. Review of the MIP images confirms the above findings. NON-VASCULAR Hepatobiliary: Coarse calcifications in the anterior left liver lobe. Normal hepatic contours and density. No visible biliary dilatation. Normal gallbladder. Pancreas: Normal contours without ductal dilatation. No peripancreatic fluid collection. Spleen: Splenic calcifications are seen. Normal arterial phase splenic enhancement pattern. Adrenals/Urinary Tract: --Adrenal glands: Normal. --Right kidney/ureter: No hydronephrosis or perinephric stranding. No nephrolithiasis. No obstructing ureteral stones. --Left kidney/ureter: No hydronephrosis or perinephric stranding. No nephrolithiasis. No obstructing ureteral stones. --Urinary bladder: Unremarkable. Stomach/Bowel: --Stomach/Duodenum: No hiatal hernia or other gastric abnormality. Normal duodenal course and caliber. --Small bowel: No dilatation or inflammation. --Colon: Colonic diverticula is noted without diverticulitis. Lymphatic:  No abdominal or pelvic lymphadenopathy. Reproductive: No free fluid in the pelvis. Musculoskeletal. Again noted are bilateral sacroiliac sclerotic changes, right greater than left with vacuum phenomena. Other: None. Review of the MIP images confirms the above findings. IMPRESSION: No acute  aortic abnormality. No acute intrathoracic pathology. Findings suggestive of granulomatous disease within the left hilum, spleen, and liver. Sigmoid colonic diverticulosis without diverticulitis. Electronically Signed   By:  Prudencio Pair M.D.   On: 02/11/2019 01:04    Assessment and Plan:   1. Complete heart block: Symptoms are similar to the feeling that she had several months ago the time of a normal heart catheterization.  Troponins are mildly elevated, but would continue to monitor without further work-up.  Due to her complete heart block, I do  feel that she Jeziel Hoffmann likely need pacemaker implant.  She does appear to have a junctional escape.  I do feel like she needs a pacemaker implant.  Unfortunately she has had breakfast this morning and thus we Adilynne Fitzwater hold off until Monday. 2. Hypertension: Currently well controlled 3. Diabetes: Continue home medications.   For questions or updates, please contact [place] Please consult [email or web address] for contact info under     Signed, Carizma Dunsworth Meredith Leeds, MD  02/11/2019 9:03 AM

## 2019-02-11 NOTE — ED Notes (Signed)
Patient transported to CT 

## 2019-02-11 NOTE — Plan of Care (Signed)

## 2019-02-11 NOTE — Progress Notes (Signed)
ANTICOAGULATION CONSULT NOTE  Pharmacy Consult for heparin Indication: chest pain/ACS   Patient Measurements: Height: 5' (152.4 cm) Weight: 181 lb 3.5 oz (82.2 kg) IBW/kg (Calculated) : 45.5 Heparin Dosing Weight: 63.5 kg  Vital Signs: Temp: 98.4 F (36.9 C) (10/03 1034) Temp Source: Oral (10/03 1034) BP: 103/72 (10/03 0945) Pulse Rate: 47 (10/03 0945)  Labs: Recent Labs    02/10/19 2051 02/10/19 2249 02/11/19 0502  HGB 14.1  --   --   HCT 43.9  --   --   PLT 308  --   --   HEPARINUNFRC  --   --  0.34  CREATININE 0.90  --  0.77  TROPONINIHS 104* 113*  --     Estimated Creatinine Clearance: 76.4 mL/min (by C-G formula based on SCr of 0.77 mg/dL).   Medical History: Past Medical History:  Diagnosis Date  . Aortic insufficiency   . Asthma 09/13/2006  . Diabetes mellitus without complication ([place])   . Hypercholesteremia   . Hypertension      Assessment: 53 yo lady with chest pain on heparin for rule out ACS. Troponin was positive. Found to be in complete heart block, cardiology is recommending a pacemaker. The initial heparin level from this morning was therapeutic.    Goal of Therapy:  Heparin level 0.3-0.7 units/ml Monitor platelets by anticoagulation protocol: Yes    Plan:  Continue heparin at 750 units/hr Check confirmatory level this afternoon Daily HL and CBC while on heparin Monitor for bleeding    Harvel Quale 02/11/2019,10:43 AM

## 2019-02-11 NOTE — Progress Notes (Signed)
New pt admission from ED. Pt brought to the floor in stable condition. Vitals taken. Initial Assessment done. All immediate pertinent needs to patient addressed. Patient Guide given to patient. Important safety instructions relating to hospitalization reviewed with patient. Patient verbalized understanding. Will continue to monitor pt.  Amauris Debois, RN 

## 2019-02-12 LAB — CBC
HCT: 40.5 % (ref 36.0–46.0)
Hemoglobin: 12.9 g/dL (ref 12.0–15.0)
MCH: 26.9 pg (ref 26.0–34.0)
MCHC: 31.9 g/dL (ref 30.0–36.0)
MCV: 84.6 fL (ref 80.0–100.0)
Platelets: 263 10*3/uL (ref 150–400)
RBC: 4.79 MIL/uL (ref 3.87–5.11)
RDW: 15.5 % (ref 11.5–15.5)
WBC: 5.1 10*3/uL (ref 4.0–10.5)
nRBC: 0 % (ref 0.0–0.2)

## 2019-02-12 LAB — HEPARIN LEVEL (UNFRACTIONATED): Heparin Unfractionated: 0.19 IU/mL — ABNORMAL LOW (ref 0.30–0.70)

## 2019-02-12 LAB — SURGICAL PCR SCREEN
MRSA, PCR: NEGATIVE
Staphylococcus aureus: NEGATIVE

## 2019-02-12 MED ORDER — VANCOMYCIN HCL IN DEXTROSE 1-5 GM/200ML-% IV SOLN
1000.0000 mg | INTRAVENOUS | Status: DC
  Filled 2019-02-12: qty 200

## 2019-02-12 MED ORDER — SODIUM CHLORIDE 0.9 % IV SOLN
80.0000 mg | INTRAVENOUS | Status: DC
  Filled 2019-02-12 (×2): qty 2

## 2019-02-12 MED ORDER — SODIUM CHLORIDE 0.9 % IV SOLN
INTRAVENOUS | Status: DC
  Administered 2019-02-13: 06:00:00 via INTRAVENOUS

## 2019-02-12 MED ORDER — VANCOMYCIN HCL IN DEXTROSE 1-5 GM/200ML-% IV SOLN
1000.0000 mg | INTRAVENOUS | Status: AC
  Administered 2019-02-13: 09:00:00 1000 mg via INTRAVENOUS
  Filled 2019-02-12: qty 200

## 2019-02-12 MED ORDER — POTASSIUM CHLORIDE CRYS ER 20 MEQ PO TBCR
60.0000 meq | EXTENDED_RELEASE_TABLET | Freq: Once | ORAL | Status: AC
  Administered 2019-02-12: 22:00:00 60 meq via ORAL
  Filled 2019-02-12: qty 3

## 2019-02-12 MED ORDER — SODIUM CHLORIDE 0.9 % IV SOLN
INTRAVENOUS | Status: AC
  Administered 2019-02-13: 11:00:00
  Filled 2019-02-12 (×2): qty 2

## 2019-02-12 NOTE — Progress Notes (Signed)
EKG CRITICAL VALUE     12 lead EKG performed.  Critical value noted.  Eather Colas, RN notified.   Malachi Bonds, CCT 02/12/2019 7:55 AM

## 2019-02-12 NOTE — Progress Notes (Signed)
Progress Note  Patient Name: Lisa Poole Date of Encounter: 02/12/2019  Primary Cardiologist: Tamala Julian   Subjective   Currently feeling well without chest pain or shortness of breath.  Her chest pain only occurs when she exerts herself.  She feels well at rest.  Inpatient Medications    Scheduled Meds: . aspirin  81 mg Oral QHS  . cholecalciferol  1,000 Units Oral Daily  . gabapentin  600 mg Oral BID  . hydrochlorothiazide  12.5 mg Oral Daily  . HYDROcodone-acetaminophen  1 tablet Oral BID  . influenza vac split quadrivalent PF  0.5 mL Intramuscular Tomorrow-1000  . losartan  12.5 mg Oral Daily  . metFORMIN  500 mg Oral BID WC   Continuous Infusions: . heparin 900 Units/hr (02/12/19 0403)  . nitroGLYCERIN 5 mcg/min (02/10/19 2321)   PRN Meds: albuterol, nitroGLYCERIN, ondansetron (ZOFRAN) IV   Vital Signs    Vitals:   02/11/19 2300 02/12/19 0410 02/12/19 0418 02/12/19 0731  BP: 120/68 (!) 89/73 97/63 117/79  Pulse: (!) 45 (!) 44 (!) 44 (!) 46  Resp: 12 12 12 15   Temp: 97.9 F (36.6 C) (!) 97.5 F (36.4 C)    TempSrc: Oral Axillary  Oral  SpO2: 93% 96% 96% 97%  Weight:      Height:        Intake/Output Summary (Last 24 hours) at 02/12/2019 0750 Last data filed at 02/11/2019 1730 Gross per 24 hour  Intake 480 ml  Output 225 ml  Net 255 ml   Last 3 Weights 02/11/2019 02/11/2019 02/10/2019  Weight (lbs) 181 lb 3.5 oz 174 lb 174 lb  Weight (kg) 82.2 kg 78.926 kg 78.926 kg      Telemetry    Sinus rhythm with complete heart block- Personally Reviewed  ECG    Sinus rhythm, complete heart block, junctional escape- Personally Reviewed  Physical Exam   GEN: No acute distress.   Neck: No JVD Cardiac:  Bradycardic, no murmurs, rubs, or gallops.  Respiratory: Clear to auscultation bilaterally. GI: Soft, nontender, non-distended  MS: No edema; No deformity. Neuro:  Nonfocal  Psych: Normal affect   Labs    High Sensitivity Troponin:   Recent Labs  Lab  02/10/19 2051 02/10/19 2249  TROPONINIHS 104* 113*      Chemistry Recent Labs  Lab 02/10/19 2051 02/11/19 0502  NA 139 137  K 3.7 3.3*  CL 106 107  CO2 22 21*  GLUCOSE 118* 134*  BUN 19 16  CREATININE 0.90 0.77  CALCIUM 9.8 8.5*  GFRNONAA >60 >60  GFRAA >60 >60  ANIONGAP 11 9     Hematology Recent Labs  Lab 02/10/19 2051 02/11/19 1217 02/12/19 0243  WBC 6.3 5.9 5.1  RBC 5.20* 5.14* 4.79  HGB 14.1 13.6 12.9  HCT 43.9 43.1 40.5  MCV 84.4 83.9 84.6  MCH 27.1 26.5 26.9  MCHC 32.1 31.6 31.9  RDW 15.2 15.4 15.5  PLT 308 290 263    BNPNo results for input(s): BNP, PROBNP in the last 168 hours.   DDimer No results for input(s): DDIMER in the last 168 hours.   Radiology    Dg Chest 2 View  Result Date: 02/10/2019 CLINICAL DATA:  Chest pain, hyperventilation EXAM: CHEST - 2 VIEW COMPARISON:  09/27/2018 FINDINGS: The heart size and mediastinal contours are within normal limits. Both lungs are clear. Disc degenerative disease of the thoracic spine. IMPRESSION: No acute abnormality of the lungs. Electronically Signed   By: Dorna Bloom.D.  On: 02/10/2019 21:30   Ct Angio Chest/abd/pel For Dissection W And/or W/wo  Result Date: 02/11/2019 CLINICAL DATA:  Chest pain, shortness of breath EXAM: CT ANGIOGRAPHY CHEST, ABDOMEN AND PELVIS TECHNIQUE: Multidetector CT imaging through the chest, abdomen and pelvis was performed using the standard protocol during bolus administration of intravenous contrast. Multiplanar reconstructed images and MIPs were obtained and reviewed to evaluate the vascular anatomy. CONTRAST:  165mL OMNIPAQUE IOHEXOL 350 MG/ML SOLN COMPARISON:  December 23, 2018 FINDINGS: CTA CHEST FINDINGS Cardiovascular: --Heart: The heart size is normal.  There is nopericardial effusion. --Aorta: The course and caliber of the thoracic aorta are normal. There is no aortic atherosclerotic calcification. Precontrast images show no aortic intramural hematoma. There is no blood  pool, dissection or penetrating ulcer demonstrated on arterial phase postcontrast imaging. There is a conventional 3 vessel aortic arch branching pattern. The proximal arch vessels are widely patent. --Pulmonary Arteries: Contrast timing is optimized for preferential opacification of the aorta. Within that limitation, normal central pulmonary arteries. Mediastinum/Nodes: Calcified left hilar lymph nodes are seen. Scattered small paratracheal lymph nodes are noted. The visualized thyroid and thoracic esophageal course are unremarkable. Lungs/Pleura: No pulmonary nodules or masses. No pleural effusion or pneumothorax. No focal airspace consolidation. No focal pleural abnormality. Musculoskeletal: No chest wall abnormality. No acute osseous findings. Review of the MIP images confirms the above findings. CTA ABDOMEN AND PELVIS FINDINGS VASCULAR Aorta:Normal caliber aorta without aneurysm, dissection, vasculitis or hemodynamically significant stenosis. There is no aortic atherosclerosis. Celiac: No aneurysm, dissection or hemodynamically significant stenosis. Normal branching pattern SMA: Widely patent without dissection or stenosis. Renals: Single renal arteries bilaterally. No aneurysm, dissection, stenosis or evidence of fibromuscular dysplasia. IMA: Patent without abnormality. Inflow: No aneurysm, stenosis or dissection. Veins: Normal course and caliber of the major veins. Assessment is otherwise limited by the arterial dominant contrast phase. Review of the MIP images confirms the above findings. NON-VASCULAR Hepatobiliary: Coarse calcifications in the anterior left liver lobe. Normal hepatic contours and density. No visible biliary dilatation. Normal gallbladder. Pancreas: Normal contours without ductal dilatation. No peripancreatic fluid collection. Spleen: Splenic calcifications are seen. Normal arterial phase splenic enhancement pattern. Adrenals/Urinary Tract: --Adrenal glands: Normal. --Right kidney/ureter: No  hydronephrosis or perinephric stranding. No nephrolithiasis. No obstructing ureteral stones. --Left kidney/ureter: No hydronephrosis or perinephric stranding. No nephrolithiasis. No obstructing ureteral stones. --Urinary bladder: Unremarkable. Stomach/Bowel: --Stomach/Duodenum: No hiatal hernia or other gastric abnormality. Normal duodenal course and caliber. --Small bowel: No dilatation or inflammation. --Colon: Colonic diverticula is noted without diverticulitis. Lymphatic:  No abdominal or pelvic lymphadenopathy. Reproductive: No free fluid in the pelvis. Musculoskeletal. Again noted are bilateral sacroiliac sclerotic changes, right greater than left with vacuum phenomena. Other: None. Review of the MIP images confirms the above findings. IMPRESSION: No acute  aortic abnormality. No acute intrathoracic pathology. Findings suggestive of granulomatous disease within the left hilum, spleen, and liver. Sigmoid colonic diverticulosis without diverticulitis. Electronically Signed   By: Prudencio Pair M.D.   On: 02/11/2019 01:04    Cardiac Studies   TTE 09/28/18  1. The left ventricle has normal systolic function with an ejection fraction of 60-65%. The cavity size was normal. There is mild concentric left ventricular hypertrophy. Left ventricular diastolic Doppler parameters are consistent with  pseudonormalization. Indeterminate filling pressures.  2. The right ventricle has normal systolic function. The cavity was normal. There is no increase in right ventricular wall thickness.  3. No evidence of mitral valve stenosis.  4. The aortic valve is tricuspid. Mild thickening of  the aortic valve. Mild calcification of the aortic valve. Aortic valve regurgitation is mild to moderate by color flow Doppler. No stenosis of the aortic valve.  5. The ascending aorta is normal in size and structure.  Patient Profile     53 y.o. female with a history of aortic insufficiency, diabetes, hyperlipidemia, hypertension who  presented to the hospital with chest pain, found to have complete heart block  Assessment & Plan    1.  Complete heart block: No rate controlling medications at this time.  She remains in complete heart block through the weekend.  We Melvina Pangelinan thus require pacemaker implant.  We Audria Takeshita plan for tomorrow.  She did have some chest pain and has been on a heparin drip as well as nitroglycerin drip.  I do feel that her chest pain is due to her complete heart block.  We Tarick Parenteau stop these medications.  2.  Hypertension: Currently well controlled  3.  Diabetes: Continue home medications  For questions or updates, please contact [place] Please consult [email or web address] for contact info under        Signed, Kanitra Purifoy Meredith Leeds, MD  02/12/2019, 7:50 AM

## 2019-02-12 NOTE — Progress Notes (Signed)
ANTICOAGULATION CONSULT NOTE  Pharmacy Consult for heparin Indication: chest pain/ACS   Patient Measurements: Height: 5' (152.4 cm) Weight: 181 lb 3.5 oz (82.2 kg) IBW/kg (Calculated) : 45.5 Heparin Dosing Weight: 63.5 kg  Vital Signs: Temp: 97.9 F (36.6 C) (10/03 2300) Temp Source: Oral (10/03 2300) BP: 120/68 (10/03 2300) Pulse Rate: 45 (10/03 2300)  Labs: Recent Labs    02/10/19 2051 02/10/19 2249 02/11/19 0502 02/11/19 1217 02/12/19 0243  HGB 14.1  --   --  13.6 12.9  HCT 43.9  --   --  43.1 40.5  PLT 308  --   --  290 263  HEPARINUNFRC  --   --  0.34 0.26* 0.19*  CREATININE 0.90  --  0.77  --   --   TROPONINIHS 104* 113*  --   --   --     Estimated Creatinine Clearance: 76.4 mL/min (by C-G formula based on SCr of 0.77 mg/dL).   Medical History: Past Medical History:  Diagnosis Date  . Aortic insufficiency   . Asthma 09/13/2006  . Diabetes mellitus without complication ([place])   . Hypercholesteremia   . Hypertension    Assessment: 53 yo lady with chest pain on heparin for rule out ACS. Troponin was positive. Found to be in complete heart block, cardiology is recommending a pacemaker. No AC PTA.  Heparin level came back subtherapeutic this morning at 0.19, on 750 units/hr. Hgb 12.9, plt 263. No s/sx of bleeding or infusion issues.   Goal of Therapy:  Heparin level 0.3-0.7 units/ml Monitor platelets by anticoagulation protocol: Yes  Plan:  Increase heparin infusion to 900 units/hr Order 6 hr heparin level Daily HL and CBC while on heparin Monitor for bleeding   Antonietta Jewel, PharmD, BCCCP Clinical Pharmacist   Please check AMION for all [place] phone numbers After 10:00 PM, call [place] [phone] 02/12/2019,3:57 AM

## 2019-02-12 NOTE — Plan of Care (Signed)
  Problem: Education: Goal: Knowledge of General Education information will improve Description: Including pain rating scale, medication(s)/side effects and non-pharmacologic comfort measures 02/12/2019 1709 by Neil Crouch, RN Outcome: Progressing 02/12/2019 1709 by Neil Crouch, RN Outcome: Progressing   Problem: Health Behavior/Discharge Planning: Goal: Ability to manage health-related needs will improve 02/12/2019 1709 by Neil Crouch, RN Outcome: Progressing 02/12/2019 1709 by Neil Crouch, RN Outcome: Progressing   Problem: Clinical Measurements: Goal: Ability to maintain clinical measurements within normal limits will improve 02/12/2019 1709 by Neil Crouch, RN Outcome: Progressing 02/12/2019 1709 by Neil Crouch, RN Outcome: Progressing Goal: Will remain free from infection 02/12/2019 1709 by Neil Crouch, RN Outcome: Progressing 02/12/2019 1709 by Neil Crouch, RN Outcome: Progressing Goal: Diagnostic test results will improve 02/12/2019 1709 by Neil Crouch, RN Outcome: Progressing 02/12/2019 1709 by Neil Crouch, RN Outcome: Progressing Goal: Respiratory complications will improve 02/12/2019 1709 by Neil Crouch, RN Outcome: Progressing 02/12/2019 1709 by Neil Crouch, RN Outcome: Progressing Goal: Cardiovascular complication will be avoided 02/12/2019 1709 by Neil Crouch, RN Outcome: Progressing 02/12/2019 1709 by Neil Crouch, RN Outcome: Progressing   Problem: Activity: Goal: Risk for activity intolerance will decrease 02/12/2019 1709 by Neil Crouch, RN Outcome: Progressing 02/12/2019 1709 by Neil Crouch, RN Outcome: Progressing   Problem: Nutrition: Goal: Adequate nutrition will be maintained 02/12/2019 1709 by Neil Crouch, RN Outcome: Progressing 02/12/2019 1709 by Neil Crouch, RN Outcome: Progressing   Problem: Coping: Goal: Level of anxiety will decrease 02/12/2019 1709 by Neil Crouch, RN Outcome: Progressing 02/12/2019 1709 by Neil Crouch,  RN Outcome: Progressing   Problem: Elimination: Goal: Will not experience complications related to bowel motility 02/12/2019 1709 by Neil Crouch, RN Outcome: Progressing 02/12/2019 1709 by Neil Crouch, RN Outcome: Progressing Goal: Will not experience complications related to urinary retention 02/12/2019 1709 by Neil Crouch, RN Outcome: Progressing 02/12/2019 1709 by Neil Crouch, RN Outcome: Progressing   Problem: Pain Managment: Goal: General experience of comfort will improve 02/12/2019 1709 by Neil Crouch, RN Outcome: Progressing 02/12/2019 1709 by Neil Crouch, RN Outcome: Progressing   Problem: Safety: Goal: Ability to remain free from injury will improve 02/12/2019 1709 by Neil Crouch, RN Outcome: Progressing 02/12/2019 1709 by Neil Crouch, RN Outcome: Progressing   Problem: Skin Integrity: Goal: Risk for impaired skin integrity will decrease 02/12/2019 1709 by Neil Crouch, RN Outcome: Progressing 02/12/2019 1709 by Neil Crouch, RN Outcome: Progressing

## 2019-02-12 NOTE — Progress Notes (Addendum)
Pt is ambulating in a hallway without chest pain and distress, plan to perform pacemaker placement tomorrow, consent been taken, surgical PCR swab been sent to lab, pt's husband is in bed side and is aware about the situation, heparin drip and nitro drip off since early am with order of Dr Curt Bears, MD paged regarding her recent K lab result, waiting for order, will continue to monitor the patient  Palma Holter, RN

## 2019-02-13 ENCOUNTER — Inpatient Hospital Stay (HOSPITAL_COMMUNITY)
Admission: EM | Disposition: A | Discharge status: Home / Self Care | Payer: Self-pay | Source: Home / Self Care | Attending: Cardiology

## 2019-02-13 ENCOUNTER — Encounter (HOSPITAL_COMMUNITY): Payer: Self-pay | Admitting: Internal Medicine

## 2019-02-13 DIAGNOSIS — I442 Atrioventricular block, complete: Secondary | ICD-10-CM

## 2019-02-13 HISTORY — PX: PACEMAKER IMPLANT: EP1218

## 2019-02-13 LAB — BASIC METABOLIC PANEL
Anion gap: 7 (ref 5–15)
BUN: 14 mg/dL (ref 6–20)
CO2: 27 mmol/L (ref 22–32)
Calcium: 9.4 mg/dL (ref 8.9–10.3)
Chloride: 103 mmol/L (ref 98–111)
Creatinine, Ser: 0.84 mg/dL (ref 0.44–1.00)
GFR calc Af Amer: >60 mL/min (ref >60)
GFR calc non Af Amer: >60 mL/min (ref >60)
Glucose, Bld: 144 mg/dL — ABNORMAL HIGH (ref 70–99)
Potassium: 4.4 mmol/L (ref 3.5–5.1)
Sodium: 137 mmol/L (ref 135–145)

## 2019-02-13 LAB — CBC
HCT: 42.5 % (ref 36.0–46.0)
Hemoglobin: 13.6 g/dL (ref 12.0–15.0)
MCH: 27 pg (ref 26.0–34.0)
MCHC: 32 g/dL (ref 30.0–36.0)
MCV: 84.3 fL (ref 80.0–100.0)
Platelets: 300 10*3/uL (ref 150–400)
RBC: 5.04 MIL/uL (ref 3.87–5.11)
RDW: 15.4 % (ref 11.5–15.5)
WBC: 4.9 10*3/uL (ref 4.0–10.5)
nRBC: 0 % (ref 0.0–0.2)

## 2019-02-13 LAB — GLUCOSE, CAPILLARY: Glucose-Capillary: 96 mg/dL (ref 70–99)

## 2019-02-13 SURGERY — PACEMAKER IMPLANT

## 2019-02-13 MED ORDER — FENTANYL CITRATE (PF) 100 MCG/2ML IJ SOLN
INTRAMUSCULAR | Status: DC | PRN
  Administered 2019-02-13: 25 ug via INTRAVENOUS
  Administered 2019-02-13: 12.5 ug via INTRAVENOUS

## 2019-02-13 MED ORDER — ONDANSETRON HCL 4 MG/2ML IJ SOLN: 4.0000 mg | Freq: Four times a day (QID) | INTRAMUSCULAR | Status: DC | PRN

## 2019-02-13 MED ORDER — LIDOCAINE HCL 1 % IJ SOLN
INTRAMUSCULAR | Status: AC
  Filled 2019-02-13: qty 60

## 2019-02-13 MED ORDER — SODIUM CHLORIDE 0.9 % IV SOLN
INTRAVENOUS | Status: AC
  Filled 2019-02-13: qty 2

## 2019-02-13 MED ORDER — HEPARIN (PORCINE) IN NACL 1000-0.9 UT/500ML-% IV SOLN
INTRAVENOUS | Status: DC | PRN
  Administered 2019-02-13: 500 mL

## 2019-02-13 MED ORDER — ACETAMINOPHEN 325 MG PO TABS
325.0000 mg | ORAL_TABLET | ORAL | Status: DC | PRN
  Administered 2019-02-13: 14:00:00 650 mg via ORAL
  Filled 2019-02-13: qty 2

## 2019-02-13 MED ORDER — MIDAZOLAM HCL 5 MG/5ML IJ SOLN
INTRAMUSCULAR | Status: AC
  Filled 2019-02-13: qty 5

## 2019-02-13 MED ORDER — FENTANYL CITRATE (PF) 100 MCG/2ML IJ SOLN
INTRAMUSCULAR | Status: AC
  Filled 2019-02-13: qty 2

## 2019-02-13 MED ORDER — MIDAZOLAM HCL 5 MG/5ML IJ SOLN
INTRAMUSCULAR | Status: DC | PRN
  Administered 2019-02-13: 1 mg via INTRAVENOUS
  Administered 2019-02-13: 2 mg via INTRAVENOUS

## 2019-02-13 MED ORDER — LIDOCAINE HCL (PF) 1 % IJ SOLN
INTRAMUSCULAR | Status: DC | PRN
  Administered 2019-02-13: 10:00:00 60 mL

## 2019-02-13 MED ORDER — LIDOCAINE HCL 1 % IJ SOLN
INTRAMUSCULAR | Status: AC
  Filled 2019-02-13: qty 20

## 2019-02-13 MED ORDER — HEPARIN (PORCINE) IN NACL 1000-0.9 UT/500ML-% IV SOLN
INTRAVENOUS | Status: AC
  Filled 2019-02-13: qty 500

## 2019-02-13 MED ORDER — VANCOMYCIN HCL IN DEXTROSE 1-5 GM/200ML-% IV SOLN
1000.0000 mg | Freq: Two times a day (BID) | INTRAVENOUS | Status: AC
  Administered 2019-02-13: 1000 mg via INTRAVENOUS
  Filled 2019-02-13: qty 200

## 2019-02-13 MED ORDER — VANCOMYCIN HCL IN DEXTROSE 1-5 GM/200ML-% IV SOLN
INTRAVENOUS | Status: AC
  Filled 2019-02-13: qty 200

## 2019-02-13 SURGICAL SUPPLY — 12 items
CABLE SURGICAL S-101-97-12 (CABLE) ×5 IMPLANT
CATH RIGHTSITE C315HIS02 (CATHETERS) ×2 IMPLANT
IPG PACE AZUR XT DR MRI W1DR01 (Pacemaker) IMPLANT
LEAD CAPSURE NOVUS 5076-52CM (Lead) ×2 IMPLANT
LEAD SELECT SECURE 3830 383069 (Lead) IMPLANT
PACE AZURE XT DR MRI W1DR01 (Pacemaker) ×3 IMPLANT
PAD PRO RADIOLUCENT 2001M-C (PAD) ×3 IMPLANT
SELECT SECURE 3830 383069 (Lead) ×3 IMPLANT
SHEATH 7FR PRELUDE SNAP 13 (SHEATH) ×4 IMPLANT
SLITTER 6232ADJ (MISCELLANEOUS) ×2 IMPLANT
TRAY PACEMAKER INSERTION (PACKS) ×3 IMPLANT
WIRE HI TORQ VERSACORE-J 145CM (WIRE) ×2 IMPLANT

## 2019-02-13 NOTE — Progress Notes (Addendum)
Electrophysiology Rounding Note  Patient Name: Lisa Poole Date of Encounter: 02/13/2019  Primary Cardiologist: No primary care provider on file. Electrophysiologist: None   Subjective   Feeling OK this am. Risks/benefits of procedure discussed earlier by Dr. Lovena Le.   Inpatient Medications    Scheduled Meds: . aspirin  81 mg Oral QHS  . cholecalciferol  1,000 Units Oral Daily  . gabapentin  600 mg Oral BID  . gentamicin irrigation   Irrigation To SS-Surg  . hydrochlorothiazide  12.5 mg Oral Daily  . HYDROcodone-acetaminophen  1 tablet Oral BID  . losartan  12.5 mg Oral Daily  . metFORMIN  500 mg Oral BID WC   Continuous Infusions: . sodium chloride 50 mL/hr at 02/13/19 0613  . vancomycin     PRN Meds: albuterol, nitroGLYCERIN, ondansetron (ZOFRAN) IV   Vital Signs    Vitals:   02/12/19 2329 02/13/19 0402 02/13/19 0753 02/13/19 0802  BP: 112/69 105/62  127/73  Pulse: (!) 46 (!) 40 (!) 40 (!) 57  Resp: 14 14 17 18   Temp: 97.9 F (36.6 C) 97.8 F (36.6 C) 97.6 F (36.4 C) (!) 97.5 F (36.4 C)  TempSrc: Oral Oral Oral Oral  SpO2: 99% 98% 98% 100%  Weight:      Height:        Intake/Output Summary (Last 24 hours) at 02/13/2019 0807 Last data filed at 02/12/2019 2200 Gross per 24 hour  Intake 660 ml  Output 550 ml  Net 110 ml   Filed Weights   02/10/19 2046 02/11/19 0058 02/11/19 1034  Weight: 78.9 kg 78.9 kg 82.2 kg    Physical Exam    GEN- The patient is well appearing, alert and oriented x 3 today.   Head- normocephalic, atraumatic Eyes-  Sclera clear, conjunctiva pink Ears- hearing intact Oropharynx- clear Neck- supple Lungs- Clear to ausculation bilaterally, normal work of breathing Heart- Regular rate and rhythm, no murmurs, rubs or gallops GI- soft, NT, ND, + BS Extremities- no clubbing, cyanosis, or edema Skin- no rash or lesion Psych- euthymic mood, full affect Neuro- strength and sensation are intact  Labs    CBC Recent Labs    02/12/19 0243 02/13/19 0238  WBC 5.1 4.9  HGB 12.9 13.6  HCT 40.5 42.5  MCV 84.6 84.3  PLT 263 [id number]   Basic Metabolic Panel Recent Labs    02/11/19 0502 02/13/19 0238  NA 137 137  K 3.3* 4.4  CL 107 103  CO2 21* 27  GLUCOSE 134* 144*  BUN 16 14  CREATININE 0.77 0.84  CALCIUM 8.5* 9.4   Liver Function Tests No results for input(s): AST, ALT, ALKPHOS, BILITOT, PROT, ALBUMIN in the last 72 hours. No results for input(s): LIPASE, AMYLASE in the last 72 hours. Cardiac Enzymes No results for input(s): CKTOTAL, CKMB, CKMBINDEX, TROPONINI in the last 72 hours.   Telemetry    NSR with complete heart block in 40s (personally reviewed)  Radiology    No results found.  Patient Profile     Lisa Poole is a 53 y.o. female with a past medical history significant for Aortic insuff, DM2, HLD, HTN.  she was admitted for chest pain and found to have complete heart block.   Assessment & Plan    1. CHB No AV nodal blockade on board, with continued CHB Will plan for PPM this morning.  Discussed risks and benefits with Dr. Lovena Le. No further questions at this time.   2. HTN Continue current regimen  3. DM2 Continue home regimen  4. Hypokalemia Supped and stable.   For questions or updates, please contact [place] Please consult [email or web address] for contact info under Cardiology/STEMI.  Signed, Shirley Friar, PA-C  02/13/2019, 8:07 AM   EP Attending  Patient seen and examined. Agree with the findings as above. The patient has persistent CHB, and will undergo PPM insertion. I have discussed the indications/risks/benefits/goals/expectations of the procedure and she wishes to proceed.  Mikle Bosworth.D.

## 2019-02-13 NOTE — H&P (View-Only) (Signed)
Electrophysiology Rounding Note  Patient Name: Lisa Poole Date of Encounter: 02/13/2019  Primary Cardiologist: No primary care provider on file. Electrophysiologist: None   Subjective   Feeling OK this am. Risks/benefits of procedure discussed earlier by Dr. Lovena Le.   Inpatient Medications    Scheduled Meds: . aspirin  81 mg Oral QHS  . cholecalciferol  1,000 Units Oral Daily  . gabapentin  600 mg Oral BID  . gentamicin irrigation   Irrigation To SS-Surg  . hydrochlorothiazide  12.5 mg Oral Daily  . HYDROcodone-acetaminophen  1 tablet Oral BID  . losartan  12.5 mg Oral Daily  . metFORMIN  500 mg Oral BID WC   Continuous Infusions: . sodium chloride 50 mL/hr at 02/13/19 0613  . vancomycin     PRN Meds: albuterol, nitroGLYCERIN, ondansetron (ZOFRAN) IV   Vital Signs    Vitals:   02/12/19 2329 02/13/19 0402 02/13/19 0753 02/13/19 0802  BP: 112/69 105/62  127/73  Pulse: (!) 46 (!) 40 (!) 40 (!) 57  Resp: 14 14 17 18   Temp: 97.9 F (36.6 C) 97.8 F (36.6 C) 97.6 F (36.4 C) (!) 97.5 F (36.4 C)  TempSrc: Oral Oral Oral Oral  SpO2: 99% 98% 98% 100%  Weight:      Height:        Intake/Output Summary (Last 24 hours) at 02/13/2019 0807 Last data filed at 02/12/2019 2200 Gross per 24 hour  Intake 660 ml  Output 550 ml  Net 110 ml   Filed Weights   02/10/19 2046 02/11/19 0058 02/11/19 1034  Weight: 78.9 kg 78.9 kg 82.2 kg    Physical Exam    GEN- The patient is well appearing, alert and oriented x 3 today.   Head- normocephalic, atraumatic Eyes-  Sclera clear, conjunctiva pink Ears- hearing intact Oropharynx- clear Neck- supple Lungs- Clear to ausculation bilaterally, normal work of breathing Heart- Regular rate and rhythm, no murmurs, rubs or gallops GI- soft, NT, ND, + BS Extremities- no clubbing, cyanosis, or edema Skin- no rash or lesion Psych- euthymic mood, full affect Neuro- strength and sensation are intact  Labs    CBC Recent Labs    02/12/19 0243 02/13/19 0238  WBC 5.1 4.9  HGB 12.9 13.6  HCT 40.5 42.5  MCV 84.6 84.3  PLT 263 [id number]   Basic Metabolic Panel Recent Labs    02/11/19 0502 02/13/19 0238  NA 137 137  K 3.3* 4.4  CL 107 103  CO2 21* 27  GLUCOSE 134* 144*  BUN 16 14  CREATININE 0.77 0.84  CALCIUM 8.5* 9.4   Liver Function Tests No results for input(s): AST, ALT, ALKPHOS, BILITOT, PROT, ALBUMIN in the last 72 hours. No results for input(s): LIPASE, AMYLASE in the last 72 hours. Cardiac Enzymes No results for input(s): CKTOTAL, CKMB, CKMBINDEX, TROPONINI in the last 72 hours.   Telemetry    NSR with complete heart block in 40s (personally reviewed)  Radiology    No results found.  Patient Profile     Lisa Poole is a 53 y.o. female with a past medical history significant for Aortic insuff, DM2, HLD, HTN.  she was admitted for chest pain and found to have complete heart block.   Assessment & Plan    1. CHB No AV nodal blockade on board, with continued CHB Will plan for PPM this morning.  Discussed risks and benefits with Dr. Lovena Le. No further questions at this time.   2. HTN Continue current regimen  3. DM2 Continue home regimen  4. Hypokalemia Supped and stable.   For questions or updates, please contact [place] Please consult [email or web address] for contact info under Cardiology/STEMI.  Signed, Shirley Friar, PA-C  02/13/2019, 8:07 AM   EP Attending  Patient seen and examined. Agree with the findings as above. The patient has persistent CHB, and will undergo PPM insertion. I have discussed the indications/risks/benefits/goals/expectations of the procedure and she wishes to proceed.  Mikle Bosworth.D.

## 2019-02-13 NOTE — Plan of Care (Signed)

## 2019-02-13 NOTE — Progress Notes (Signed)
Orthopedic Tech Progress Note Patient Details:  Lisa Poole Jun 27, 1965 [id number] Patient already has arm sling. Patient ID: [name], female   DOB: 06-30-1965, 53 y.o.   MRN: [id number]   [name] 02/13/2019, 1:03 PM

## 2019-02-13 NOTE — Interval H&P Note (Signed)
History and Physical Interval Note:  02/13/2019 9:42 AM  Lisa Poole  has presented today for surgery, with the diagnosis of heart block.  The various methods of treatment have been discussed with the patient and family. After consideration of risks, benefits and other options for treatment, the patient has consented to  Procedure(s): PACEMAKER IMPLANT (N/A) as a surgical intervention.  The patient's history has been reviewed, patient examined, no change in status, stable for surgery.  I have reviewed the patient's chart and labs.  Questions were answered to the patient's satisfaction.     Cristopher Peru

## 2019-02-13 NOTE — Discharge Instructions (Signed)
After Your Pacemaker   You have a Medtronic Pacemaker   Do not lift your arm above shoulder height for 1 week after your procedure. After 7 days, you may progress as below.     Monday February 20, 2019  Tuesday February 21, 2019 Wednesday February 22, 2019 Thursday February 23, 2019    Do not lift, push, pull, or carry anything over 10 pounds with the affected arm until 6 weeks (Monday March 27, 2019) after your procedure.    Do not drive until your wound check, or until instructed by your healthcare provider that you are safe to do so.    Monitor your pacemaker site for redness, swelling, and drainage. Call the device clinic at [phone] if you experience these symptoms or fever/chills.   If your incision is sealed with Steri-strips or staples. You may shower 7 days after your procedure and wash your incision with soap and water as long as it is healed. If your incision is closed with Dermabond/Surgical glue. You may shower 1 day after your pacemaker implant and wash your incision with soap and water. Avoid lotions, ointments, or perfumes over your incision until it is well-healed.   You may use a hot tub or a pool AFTER your wound check appointment if the incision is completely closed.   Your Pacemaker may be MRI compatible. We will discuss this at your first follow up/wound check. .    Remote monitoring is used to monitor your pacemaker from home. This monitoring is scheduled every 91 days by our office. It allows [place] to keep an eye on the functioning of your device to ensure it is working properly. You will routinely see your Electrophysiologist annually (more often if necessary).    Pacemaker Implantation, Care After This sheet gives you information about how to care for yourself after your procedure. Your health care provider may also give you more specific instructions. If you have problems or questions, contact your health care provider. What can I expect after the  procedure? After the procedure, it is common to have:  Mild pain.  Slight bruising.  Some swelling over the incision.  A slight bump over the skin where the device was placed. Sometimes, it is possible to feel the device under the skin. This is normal.  You should received your Pacemaker ID card within 4-8 weeks. Follow these instructions at home: Medicines  Take over-the-counter and prescription medicines only as told by your health care provider.  If you were prescribed an antibiotic medicine, take it as told by your health care provider. Do not stop taking the antibiotic even if you start to feel better. Wound care     Do not remove the bandage on your chest until directed to do so by your health care provider.  After your bandage is removed, you may see pieces of tape called skin adhesive strips over the area where the cut was made (incision site). Let them fall off on their own.  Check the incision site every day to make sure it is not infected, bleeding, or starting to pull apart.  Do not use lotions or ointments near the incision site unless directed to do so.  Keep the incision area clean and dry for 7 days after the procedure or as directed by your health care provider. It takes several weeks for the incision site to completely heal.  Do not take baths, swim, or use a hot tub for 7-10 days or as otherwise directed by your  health care provider. Activity  Do not drive or use heavy machinery while taking prescription pain medicine.  Do not drive for 24 hours if you were given a medicine to help you relax (sedative).  Check with your health care provider before you start to drive or play sports.  Avoid sudden jerking, pulling, or chopping movements that pull your upper arm far away from your body. Avoid these movements for at least 6 weeks or as long as told by your health care provider.  Do not lift your upper arm above your shoulders for at least 6 weeks or as long  as told by your health care provider. This means no tennis, golf, or swimming.  You may go back to work when your health care provider says it is okay. Pacemaker care  You may be shown how to transfer data from your pacemaker through the phone to your health care provider.  Always let all health care providers know about your pacemaker before you have any medical procedures or tests.  Wear a medical ID bracelet or necklace stating that you have a pacemaker. Carry a pacemaker ID card with you at all times.  Your pacemaker battery will last for 5-15 years. Routine checks by your health care provider will let the health care provider know when the battery is starting to run down. The pacemaker will need to be replaced when the battery starts to run down.  Do not use amateur Chief of Staff. Other electrical devices are safe to use, including power tools, lawn mowers, and speakers. If you are unsure of whether something is safe to use, ask your health care provider.  When using your cell phone, hold it to the ear opposite the pacemaker. Do not leave your cell phone in a pocket over the pacemaker.  Avoid places or objects that have a strong electric or magnetic field, including: ? Airport Herbalist. When at the airport, let officials know that you have a pacemaker. ? Power plants. ? Large electrical generators. ? Radiofrequency transmission towers, such as cell phone and radio towers. General instructions  Weigh yourself every day. If you suddenly gain weight, fluid may be building up in your body.  Keep all follow-up visits as told by your health care provider. This is important. Contact a health care provider if:  You gain weight suddenly.  Your legs or feet swell.  It feels like your heart is fluttering or skipping beats (heart palpitations).  You have chills or a fever.  You have more redness, swelling, or pain around your incisions.  You have  more fluid or blood coming from your incisions.  Your incisions feel warm to the touch.  You have pus or a bad smell coming from your incisions. Get help right away if:  You have chest pain.  You have trouble breathing or are short of breath.  You become extremely tired.  You are light-headed or you faint. This information is not intended to replace advice given to you by your health care provider. Make sure you discuss any questions you have with your health care provider.

## 2019-02-13 NOTE — Discharge Summary (Addendum)
ELECTROPHYSIOLOGY PROCEDURE DISCHARGE SUMMARY    Patient ID: Lisa Poole,  MRN: [id number], DOB/AGE: 54/29/1967 54 y.o.  Admit date: 02/10/2019 Discharge date: 02/14/19   Primary Care Physician: Milford Cage, PA  Primary Cardiologist: No primary care provider on file.  Electrophysiologist: None  Primary Discharge Diagnosis:  Symptomatic bradycardia status post pacemaker implantation this admission  Secondary Discharge Diagnosis:  HTN DM2 Hypokalemia  Allergies  Allergen Reactions  . Lisinopril Other (See Comments) and Cough    Flu like symptoms, also   . Ace Inhibitors Other (See Comments)    Flu-like symptoms  . Augmentin [Amoxicillin-Pot Clavulanate] Nausea And Vomiting  . Benazepril Other (See Comments)    Flu like symptoms  . Lotensin [Benazepril Hcl] Other (See Comments)    Flu like symptoms  . Oxycodone-Acetaminophen Nausea And Vomiting  . Sulfa Antibiotics Nausea And Vomiting     Procedures This Admission:  1.  Implantation of a Medtronic dual chamber PPM on 02/13/2019 by Dr. Lovena Le.  The patient received a Medtronic model number [id number] PPM with model number 5076 right atrial lead and 3830 right ventricular lead. There were no immediate post procedure complications. 2.  CXR on 02/14/19  demonstrated no pneumothorax status post device implantation.   Brief HPI: Lisa Poole is a 53 y.o. female was referred to electrophysiology in the outpatient setting for consideration of PPM implantation.  Past medical history includes above.  The patient has had symptomatic bradycardia without reversible causes identified.  Risks, benefits, and alternatives to PPM implantation were reviewed with the patient who wished to proceed.   Hospital Course:  The patient was admitted and underwent implantation of a Medtronic dual chamber PPM with details as outlined above.  She was monitored on telemetry overnight which demonstrated appropriate pacing.  Left chest was  without hematoma or ecchymosis.  The device was interrogated and found to be functioning normally.  CXR was obtained and demonstrated no pneumothorax status post device implantation.  Wound care, arm mobility, and restrictions were reviewed with the patient.  The patient was examined and considered stable for discharge to home.    Physical Exam: Vitals:   02/13/19 1630 02/13/19 2057 02/13/19 2356 02/14/19 0348  BP: 134/78 118/60 120/65 118/82  Pulse: 86 78 74 71  Resp: (!) 22 (!) 32 16 10  Temp:  98 F (36.7 C) 98.1 F (36.7 C) 97.9 F (36.6 C)  TempSrc:  Oral Oral Oral  SpO2: 97% 97% 98% 97%  Weight:      Height:        GEN- The patient is well appearing, alert and oriented x 3 today.   HEENT: normocephalic, atraumatic; sclera clear, conjunctiva pink; hearing intact; oropharynx clear; neck supple, no JVP Lymph- no cervical lymphadenopathy Lungs- Clear to ausculation bilaterally, normal work of breathing.  No wheezes, rales, rhonchi Heart- Regular rate and rhythm, no murmurs, rubs or gallops, PMI not laterally displaced GI- soft, non-tender, non-distended, bowel sounds present, no hepatosplenomegaly Extremities- no clubbing, cyanosis, or edema; DP/PT/radial pulses 2+ bilaterally MS- no significant deformity or atrophy Skin- warm and dry, no rash or lesion, left chest without hematoma/ecchymosis Psych- euthymic mood, full affect Neuro- strength and sensation are intact   Labs:   Lab Results  Component Value Date   WBC 4.9 02/13/2019   HGB 13.6 02/13/2019   HCT 42.5 02/13/2019   MCV 84.3 02/13/2019   PLT 300 02/13/2019    Recent Labs  Lab 02/13/19 0238  NA 137  K 4.4  CL 103  CO2 27  BUN 14  CREATININE 0.84  CALCIUM 9.4  GLUCOSE 144*    Discharge Medications:  Allergies as of 02/14/2019      Reactions   Lisinopril Other (See Comments), Cough   Flu like symptoms, also   Ace Inhibitors Other (See Comments)   Flu-like symptoms   Augmentin [amoxicillin-pot  Clavulanate] Nausea And Vomiting   Benazepril Other (See Comments)   Flu like symptoms   Lotensin [benazepril Hcl] Other (See Comments)   Flu like symptoms   Oxycodone-acetaminophen Nausea And Vomiting   Sulfa Antibiotics Nausea And Vomiting      Medication List    TAKE these medications   acetaminophen 325 MG tablet Commonly known as: TYLENOL Take 1-2 tablets (325-650 mg total) by mouth every 4 (four) hours as needed for mild pain.   albuterol 108 (90 Base) MCG/ACT inhaler Commonly known as: VENTOLIN HFA Inhale 1-2 puffs into the lungs every 6 (six) hours as needed for wheezing or shortness of breath.   aspirin 81 MG chewable tablet Chew 81 mg by mouth at bedtime.   ciprofloxacin 500 MG tablet Commonly known as: Cipro Take 1 tablet (500 mg total) by mouth 2 (two) times daily. One po bid x 7 days   gabapentin 600 MG tablet Commonly known as: NEURONTIN Take 600 mg by mouth 2 (two) times daily.   hydrochlorothiazide 12.5 MG capsule Commonly known as: MICROZIDE Take 12.5 mg by mouth daily.   HYDROcodone-acetaminophen 10-325 MG tablet Commonly known as: NORCO Take 1 tablet by mouth 2 (two) times daily.   Invokamet XR 50-500 MG Tb24 Generic drug: Canagliflozin-metFORMIN HCl ER Take 1 tablet by mouth 2 (two) times daily.   losartan 25 MG tablet Commonly known as: COZAAR Take 0.5 tablets (12.5 mg total) by mouth daily.   lovastatin 10 MG tablet Commonly known as: MEVACOR Take 10 mg by mouth at bedtime.   meloxicam 15 MG tablet Commonly known as: MOBIC Take 15 mg by mouth daily.   metroNIDAZOLE 500 MG tablet Commonly known as: Flagyl Take 1 tablet (500 mg total) by mouth 2 (two) times daily. One po bid x 7 days   Narcan 4 MG/0.1ML Liqd nasal spray kit Generic drug: naloxone Place 1 spray into the nose as directed.   Vitamin D-3 25 MCG (1000 UT) Caps Take 1,000-2,000 Units by mouth daily.       Disposition:   Follow-up Information    Evans Lance, MD  Follow up on 05/18/2019.   Specialty: Cardiology Why: at 230 pm for 3 month pacemaker follow up Contact information: [address] [place]  [address] [phone]        [facility] Follow up on 02/23/2019.   Why: at 0930 for pacemaker wound check Contact information: [place] [address] [phone]          Duration of Discharge Encounter: Greater than 30 minutes including physician time.  Signed, Shirley Friar, PA-C  02/14/2019 7:30 AM  EP Attending  Patient seen and examined. PPM interrogation was performed under my direct supervision demonstrates normal DDD PM function. She will be dischareged home with usual followup.   Mikle Bosworth.D.

## 2019-02-14 ENCOUNTER — Inpatient Hospital Stay (HOSPITAL_COMMUNITY): Payer: Medicare Other

## 2019-02-14 LAB — GLUCOSE, CAPILLARY: Glucose-Capillary: 113 mg/dL — ABNORMAL HIGH (ref 70–99)

## 2019-02-14 MED ORDER — ACETAMINOPHEN 325 MG PO TABS: 325.0000 mg | ORAL_TABLET | ORAL | Status: DC | PRN

## 2019-02-14 MED FILL — Lidocaine HCl Local Inj 1%: INTRAMUSCULAR | Qty: 60 | Status: AC

## 2019-02-14 MED FILL — Lidocaine HCl Local Inj 1%: INTRAMUSCULAR | Qty: 20 | Status: AC

## 2019-02-14 NOTE — Plan of Care (Signed)

## 2019-02-14 NOTE — Progress Notes (Signed)
Transport here to take patient down to xray.

## 2019-02-14 NOTE — Plan of Care (Signed)
  Problem: Education: Goal: Knowledge of General Education information will improve Description: Including pain rating scale, medication(s)/side effects and non-pharmacologic comfort measures 02/14/2019 1020 by Shanon Ace, RN Outcome: Completed/Met 02/14/2019 0955 by Shanon Ace, RN Outcome: Adequate for Discharge   Problem: Health Behavior/Discharge Planning: Goal: Ability to manage health-related needs will improve 02/14/2019 1020 by Shanon Ace, RN Outcome: Completed/Met 02/14/2019 0955 by Shanon Ace, RN Outcome: Adequate for Discharge   Problem: Clinical Measurements: Goal: Ability to maintain clinical measurements within normal limits will improve 02/14/2019 1020 by Shanon Ace, RN Outcome: Completed/Met 02/14/2019 0955 by Shanon Ace, RN Outcome: Adequate for Discharge Goal: Will remain free from infection 02/14/2019 1020 by Shanon Ace, RN Outcome: Completed/Met 02/14/2019 0955 by Shanon Ace, RN Outcome: Adequate for Discharge Goal: Diagnostic test results will improve 02/14/2019 1020 by Shanon Ace, RN Outcome: Completed/Met 02/14/2019 0955 by Shanon Ace, RN Outcome: Adequate for Discharge Goal: Respiratory complications will improve 02/14/2019 1020 by Shanon Ace, RN Outcome: Completed/Met 02/14/2019 0955 by Shanon Ace, RN Outcome: Adequate for Discharge Goal: Cardiovascular complication will be avoided 02/14/2019 1020 by Shanon Ace, RN Outcome: Completed/Met 02/14/2019 0955 by Shanon Ace, RN Outcome: Adequate for Discharge   Problem: Activity: Goal: Risk for activity intolerance will decrease 02/14/2019 1020 by Shanon Ace, RN Outcome: Completed/Met 02/14/2019 0955 by Shanon Ace, RN Outcome: Adequate for Discharge   Problem: Nutrition: Goal: Adequate nutrition will be maintained 02/14/2019 1020 by Shanon Ace, RN Outcome: Completed/Met 02/14/2019 0955 by Shanon Ace, RN Outcome: Adequate for Discharge   Problem:  Coping: Goal: Level of anxiety will decrease 02/14/2019 1020 by Shanon Ace, RN Outcome: Completed/Met 02/14/2019 0955 by Shanon Ace, RN Outcome: Adequate for Discharge   Problem: Elimination: Goal: Will not experience complications related to bowel motility 02/14/2019 1020 by Shanon Ace, RN Outcome: Completed/Met 02/14/2019 0955 by Shanon Ace, RN Outcome: Adequate for Discharge Goal: Will not experience complications related to urinary retention 02/14/2019 1020 by Shanon Ace, RN Outcome: Completed/Met 02/14/2019 0955 by Shanon Ace, RN Outcome: Adequate for Discharge   Problem: Pain Managment: Goal: General experience of comfort will improve 02/14/2019 1020 by Shanon Ace, RN Outcome: Completed/Met 02/14/2019 0955 by Shanon Ace, RN Outcome: Adequate for Discharge   Problem: Safety: Goal: Ability to remain free from injury will improve 02/14/2019 1020 by Shanon Ace, RN Outcome: Completed/Met 02/14/2019 0955 by Shanon Ace, RN Outcome: Adequate for Discharge   Problem: Skin Integrity: Goal: Risk for impaired skin integrity will decrease 02/14/2019 1020 by Shanon Ace, RN Outcome: Completed/Met 02/14/2019 0955 by Shanon Ace, RN Outcome: Adequate for Discharge

## 2019-02-14 NOTE — Progress Notes (Signed)
Pt to be discharged home with spouse, has her AVS and instructions for home with f/u appointment.  Pt to be taken to front of hospital via W/C

## 2019-02-15 DIAGNOSIS — N812 Incomplete uterovaginal prolapse: Secondary | ICD-10-CM | POA: Insufficient documentation

## 2019-02-21 ENCOUNTER — Telehealth: Payer: Self-pay | Admitting: Internal Medicine

## 2019-02-21 NOTE — Telephone Encounter (Signed)
Explained to patient that family members will not be allowed to accompany her to her wound check appointment due to Covid-19 protocols. Concerned about spouse being educated on wound care after steri-strips removed. Explained that no wound care other than soap and water will be needed. If she requests we will call her spouse and place him on speaker phone for the appointment.

## 2019-02-21 NOTE — Telephone Encounter (Signed)
New Message:    Pt have an appt on Thursday(02-23-19) for a wound check. She says her husband will need to come in with her for her apptt,, he is helping to take care of her wound.

## 2019-02-23 ENCOUNTER — Other Ambulatory Visit: Payer: Self-pay

## 2019-02-23 ENCOUNTER — Ambulatory Visit (INDEPENDENT_AMBULATORY_CARE_PROVIDER_SITE_OTHER): Payer: Medicare Other | Admitting: Student

## 2019-02-23 ENCOUNTER — Encounter: Payer: Self-pay | Admitting: *Deleted

## 2019-02-23 DIAGNOSIS — I442 Atrioventricular block, complete: Secondary | ICD-10-CM | POA: Diagnosis not present

## 2019-02-23 LAB — CUP PACEART INCLINIC DEVICE CHECK
Battery Remaining Longevity: 136 mo
Battery Voltage: 3.2 V
Brady Statistic AP VP Percent: 0.96 %
Brady Statistic AP VS Percent: 0 %
Brady Statistic AS VP Percent: 98.47 %
Brady Statistic AS VS Percent: 0.56 %
Brady Statistic RA Percent Paced: 1.05 %
Brady Statistic RV Percent Paced: 99.44 %
Date Time Interrogation Session: 20201015100359
Implantable Lead Implant Date: 20201005
Implantable Lead Implant Date: 20201005
Implantable Lead Location: 753859
Implantable Lead Location: 753860
Implantable Lead Model: 3830
Implantable Lead Model: 5076
Implantable Pulse Generator Implant Date: 20201005
Lead Channel Impedance Value: 304 Ohm
Lead Channel Impedance Value: 361 Ohm
Lead Channel Impedance Value: 494 Ohm
Lead Channel Impedance Value: 532 Ohm
Lead Channel Pacing Threshold Amplitude: 0.625 V
Lead Channel Pacing Threshold Amplitude: 0.75 V
Lead Channel Pacing Threshold Pulse Width: 0.4 ms
Lead Channel Pacing Threshold Pulse Width: 0.4 ms
Lead Channel Sensing Intrinsic Amplitude: 3 mV
Lead Channel Sensing Intrinsic Amplitude: 3.5 mV
Lead Channel Sensing Intrinsic Amplitude: 7.875 mV
Lead Channel Sensing Intrinsic Amplitude: 8.25 mV
Lead Channel Setting Pacing Amplitude: 3.5 V
Lead Channel Setting Pacing Amplitude: 3.5 V
Lead Channel Setting Pacing Pulse Width: 0.4 ms
Lead Channel Setting Sensing Sensitivity: 4 mV

## 2019-02-23 NOTE — Progress Notes (Signed)
Wound check appointment. Steri-strips removed. Wound without redness or edema. Incision edges approximated, wound well healed. Normal device function. Thresholds, sensing, and impedances consistent with implant measurements. Device programmed at 3.5V/auto capture programmed on for extra safety margin until 3 month visit. Histogram distribution appropriate for patient and level of activity. 1 AF, false, far-field oversensing falling in blanking. Atrial sensitivity decreased to 0.45 mV. Patient educated about wound care, arm mobility, lifting restrictions. ROV in 3 months with Dr. Lovena Le.

## 2019-03-06 ENCOUNTER — Telehealth: Payer: Self-pay | Admitting: Internal Medicine

## 2019-03-06 ENCOUNTER — Encounter: Payer: Self-pay | Admitting: Student

## 2019-03-06 NOTE — Telephone Encounter (Signed)
Lisa Poole will you be in the office tomorrow to sign the note?

## 2019-03-06 NOTE — Telephone Encounter (Signed)
Letter updated and placed in chart.  Pt can pick up at office at her convenience. I am unavailable to sign.   Legrand Como [address]" Bowen, PA-C  03/06/2019 11:54 AM

## 2019-03-06 NOTE — Telephone Encounter (Signed)
New Message:   Pt says she needs another letter for work. The letter needs to state that pt can return to work with being able able to lift up to 10 lbs until 03-28-19. Pt after 03-28-19, she will have no restrictions.e

## 2019-03-06 NOTE — Telephone Encounter (Signed)
I left a message for the pt that we do have an update letter for her, though just need the provider to sign the letter. Will call for pt to pick up letter once provider has signed the letter.

## 2019-03-07 ENCOUNTER — Telehealth: Payer: Self-pay | Admitting: *Deleted

## 2019-03-08 NOTE — Telephone Encounter (Signed)
Follow Up:     Pt is calling, concerning her letter for work.

## 2019-03-08 NOTE — Telephone Encounter (Signed)
Pt called and is aware letter is ready for her to pick up. She states she will either stop by today or tomorrow morning. I stated to the pt I will place her letter at our front desk. I told her that I will place with our screening table downstairs incase she does not come in today. Pt thanked me for the call and my help.

## 2019-03-08 NOTE — Telephone Encounter (Signed)
I s/w pt and explained that Lisa Poole, [facility] is in the hospital this week . I said that I will see if possiblty that he may run over to the office quick to sign work note. I statd that just as soon as I can get him to sign the work note I will call her to come pick up the note. Pt thanked me for the call back.

## 2019-03-21 ENCOUNTER — Encounter: Payer: Self-pay | Admitting: Internal Medicine

## 2019-03-21 ENCOUNTER — Other Ambulatory Visit: Payer: Self-pay

## 2019-03-21 ENCOUNTER — Ambulatory Visit (INDEPENDENT_AMBULATORY_CARE_PROVIDER_SITE_OTHER): Payer: Medicare Other | Admitting: Student

## 2019-03-21 DIAGNOSIS — R000 Tachycardia, unspecified: Secondary | ICD-10-CM

## 2019-03-21 DIAGNOSIS — I471 Supraventricular tachycardia: Secondary | ICD-10-CM

## 2019-03-21 DIAGNOSIS — I442 Atrioventricular block, complete: Secondary | ICD-10-CM

## 2019-03-21 LAB — CUP PACEART INCLINIC DEVICE CHECK
Battery Remaining Longevity: 132 mo
Battery Voltage: 3.19 V
Brady Statistic AP VP Percent: 1.16 %
Brady Statistic AP VS Percent: 0.01 %
Brady Statistic AS VP Percent: 98.5 %
Brady Statistic AS VS Percent: 0.33 %
Brady Statistic RA Percent Paced: 1.25 %
Brady Statistic RV Percent Paced: 99.66 %
Date Time Interrogation Session: 20201110113739
Implantable Lead Implant Date: 20201005
Implantable Lead Implant Date: 20201005
Implantable Lead Location: 753859
Implantable Lead Location: 753860
Implantable Lead Model: 3830
Implantable Lead Model: 5076
Implantable Pulse Generator Implant Date: 20201005
Lead Channel Impedance Value: 304 Ohm
Lead Channel Impedance Value: 342 Ohm
Lead Channel Impedance Value: 437 Ohm
Lead Channel Impedance Value: 456 Ohm
Lead Channel Pacing Threshold Amplitude: 0.5 V
Lead Channel Pacing Threshold Amplitude: 0.875 V
Lead Channel Pacing Threshold Pulse Width: 0.4 ms
Lead Channel Pacing Threshold Pulse Width: 0.4 ms
Lead Channel Sensing Intrinsic Amplitude: 2.25 mV
Lead Channel Sensing Intrinsic Amplitude: 2.5 mV
Lead Channel Sensing Intrinsic Amplitude: 8 mV
Lead Channel Sensing Intrinsic Amplitude: 8.25 mV
Lead Channel Setting Pacing Amplitude: 3.5 V
Lead Channel Setting Pacing Amplitude: 3.5 V
Lead Channel Setting Pacing Pulse Width: 0.4 ms
Lead Channel Setting Sensing Sensitivity: 4 mV

## 2019-03-21 MED ORDER — METOPROLOL TARTRATE 25 MG PO TABS: 25.0000 mg | ORAL_TABLET | Freq: Two times a day (BID) | ORAL | 3 refills | Status: DC

## 2019-03-21 NOTE — Progress Notes (Signed)
Pacemaker check in clinic. Pt seen today for palpitations and SOB. Thresholds and impedances consistent with previous measurements. Pt having tachycardia with poor tracking with atrial beats falling into refractory period. Discussed with Medtronic and Dr. Lovena Le. Pt Max track rate increased to 140 bpm.  Pt PVARP lengthened to 360 ms with 1:1 tracking. PVARP then decreased to 250 ms, then finally turned back onto AUTO. Pt continued to have 1:1 tracking. Atrial NIPS performed with Dr. Lovena Le, but rhythm did not break, suggesting "sinus" tachycardia. started on low dose lopressor. Mode switches available showed examples of above and occasional ? atrial tach.  Device programmed at appropriate safety margins. Estimated longevity 11 yr. Patient enrolled in remote follow-up. Follow up with Dr. Lovena Le as scheduled in January.

## 2019-03-21 NOTE — Telephone Encounter (Signed)
Error

## 2019-04-05 ENCOUNTER — Telehealth: Payer: Self-pay | Admitting: *Deleted

## 2019-04-05 NOTE — Telephone Encounter (Signed)
   [facility] Pre-operative Risk Assessment    Request for surgical clearance:  1. What type of surgery is being performed? DENTAL CLEANING, X-RAYS, (POSSIBLITIES: FILLINGS, EXTRACTION, ROOT CANAL)  2. When is this surgery scheduled? TBD   3. What type of clearance is required (medical clearance vs. Pharmacy clearance to hold med vs. Both)? MEDICAL  4. Are there any medications that need to be held prior to surgery and how long? ASA; ASKING IF PT NEEDS SBE   5. Practice name and name of physician performing surgery? ERIC SADLER, DDS   6. What is your office phone number [phone]    7.   What is your office fax number [phone]  8.   Anesthesia type (None, local, MAC, general) ? LOCAL, NITROUS OXIDE   Lisa Poole 04/05/2019, 9:39 AM  _________________________________________________________________   (provider comments below)

## 2019-04-10 NOTE — Telephone Encounter (Signed)
   Primary Cardiologist: No primary care provider on file.  Chart reviewed as part of pre-operative protocol coverage. Simple dental extractions are considered low risk procedures per guidelines and generally do not require any specific cardiac clearance. It is also generally accepted that for simple extractions and dental cleanings, there is no need to interrupt blood thinner therapy.   The patient has a pacemaker for bradycardia. She had no obstructive CAD by cardiac cath in 09/2018. She had normal LV function and no significant valvular disease by echo in 09/2018.   If aspirin is requested to be held for certain procedures like oral surgery or multiple extractions (more than 2), this would need to be requested at the time. Clinical status can change and we cannot provide recommendations for the future.   SBE prophylaxis is not required for the patient.  I will route this recommendation to the requesting party via Epic fax function and remove from pre-op pool.  Please call with questions.  Daune Perch, NP 04/10/2019, 9:13 AM

## 2019-04-27 ENCOUNTER — Telehealth: Payer: Self-pay | Admitting: Internal Medicine

## 2019-04-27 NOTE — Telephone Encounter (Signed)
Pt c/o of Chest Pain: STAT if CP now or developed within 24 hours  1. Are you having CP right now? No sharp pain at the moment  2. Are you experiencing any other symptoms (ex. SOB, nausea, vomiting, sweating)? no  3. How long have you been experiencing CP? 3 days or so  4. Is your CP continuous or coming and going? Sharp pain coming and going but a dull pain that stays constant  5. Have you taken Nitroglycerin? no ? Patient states she had her pace maker removed and that is where the pain feels like it is originating from. I offered to make an appt with Tommye Standard but she would like to speak to a nurse first.

## 2019-04-27 NOTE — Telephone Encounter (Signed)
Spoke with patient. Pain started a couple of weeks ago. No fevers, chills, drainage, warmth. Transmission reviewed and normal.  Appt made for wound check next Tuesday per patient request.  Chanetta Marshall, NP 04/27/2019 4:32 PM

## 2019-04-27 NOTE — Telephone Encounter (Signed)
The pt states she has some chest pain around her ppm site. She states it is not a sharp pain right now but she still feel pain. She has some SOB at night when she lay down. Pt denies pain in her arms or neck. She denies feeling nauseated. She states when she feels the sharp pain when she stop and sit down it stop being sharp but just a constant pain. I had the pt send a transmission. Transmission received. I told her I will have the nurse review it and give her a call back.

## 2019-05-02 ENCOUNTER — Other Ambulatory Visit: Payer: Self-pay

## 2019-05-02 ENCOUNTER — Ambulatory Visit (INDEPENDENT_AMBULATORY_CARE_PROVIDER_SITE_OTHER): Payer: Medicare Other | Admitting: Student

## 2019-05-02 DIAGNOSIS — I442 Atrioventricular block, complete: Secondary | ICD-10-CM | POA: Diagnosis not present

## 2019-05-02 DIAGNOSIS — I471 Supraventricular tachycardia: Secondary | ICD-10-CM

## 2019-05-02 LAB — CUP PACEART INCLINIC DEVICE CHECK
Battery Remaining Longevity: 131 mo
Battery Voltage: 3.16 V
Brady Statistic AP VP Percent: 13.04 %
Brady Statistic AP VS Percent: 0.01 %
Brady Statistic AS VP Percent: 86.17 %
Brady Statistic AS VS Percent: 0.78 %
Brady Statistic RA Percent Paced: 13.32 %
Brady Statistic RV Percent Paced: 99.21 %
Date Time Interrogation Session: 20201222125224
Implantable Lead Implant Date: 20201005
Implantable Lead Implant Date: 20201005
Implantable Lead Location: 753859
Implantable Lead Location: 753860
Implantable Lead Model: 3830
Implantable Lead Model: 5076
Implantable Pulse Generator Implant Date: 20201005
Lead Channel Impedance Value: 304 Ohm
Lead Channel Impedance Value: 399 Ohm
Lead Channel Impedance Value: 456 Ohm
Lead Channel Impedance Value: 551 Ohm
Lead Channel Pacing Threshold Amplitude: 0.625 V
Lead Channel Pacing Threshold Amplitude: 0.875 V
Lead Channel Pacing Threshold Pulse Width: 0.4 ms
Lead Channel Pacing Threshold Pulse Width: 0.4 ms
Lead Channel Sensing Intrinsic Amplitude: 2.875 mV
Lead Channel Sensing Intrinsic Amplitude: 3.5 mV
Lead Channel Sensing Intrinsic Amplitude: 9 mV
Lead Channel Sensing Intrinsic Amplitude: 9.5 mV
Lead Channel Setting Pacing Amplitude: 3.5 V
Lead Channel Setting Pacing Amplitude: 3.5 V
Lead Channel Setting Pacing Pulse Width: 0.4 ms
Lead Channel Setting Sensing Sensitivity: 4 mV

## 2019-05-02 NOTE — Progress Notes (Signed)
Pacemaker check in clinic. Pt has been having an "aching" sensation around her pacemaker. Site stable. No inflammatory changes, swelling, ecchymosis, edema, erythema, or tenderness. Normal device function. Thresholds, sensing, impedances consistent with previous measurements. Device programmed to maximize longevity. 1 mode switch that appears AT. Just over 1 minute long. Overall burden low. Device programmed at appropriate safety margins. Histogram distribution appropriate for patient activity level. Estimated longevity 10 yr, 11 mo. Patient enrolled in remote follow-up, Has appointment with Dr. Lovena Le next month for 91 day check.

## 2019-05-09 ENCOUNTER — Encounter: Payer: Self-pay | Admitting: *Deleted

## 2019-05-18 ENCOUNTER — Ambulatory Visit (INDEPENDENT_AMBULATORY_CARE_PROVIDER_SITE_OTHER): Payer: Medicare Other | Admitting: Internal Medicine

## 2019-05-18 ENCOUNTER — Encounter: Payer: Self-pay | Admitting: Internal Medicine

## 2019-05-18 ENCOUNTER — Other Ambulatory Visit: Payer: Self-pay

## 2019-05-18 VITALS — BP 118/84 | HR 69 | Ht 60.0 in | Wt 178.6 lb

## 2019-05-18 DIAGNOSIS — Z950 Presence of cardiac pacemaker: Secondary | ICD-10-CM | POA: Insufficient documentation

## 2019-05-18 DIAGNOSIS — I442 Atrioventricular block, complete: Secondary | ICD-10-CM

## 2019-05-18 NOTE — Progress Notes (Signed)
HPI Lisa Poole returns today for followup of her PPM. She is a pleasant 54 yo woman with CHB, s/p PPM insertion. She has had some atrial arrhythmias. She denies palpitations, sob or chest pain. No edema.  Allergies  Allergen Reactions  . Lisinopril Other (See Comments) and Cough    Flu like symptoms, also   . Ace Inhibitors Other (See Comments)    Flu-like symptoms  . Augmentin [Amoxicillin-Pot Clavulanate] Nausea And Vomiting  . Benazepril Other (See Comments)    Flu like symptoms  . Lotensin [Benazepril Hcl] Other (See Comments)    Flu like symptoms  . Oxycodone-Acetaminophen Nausea And Vomiting  . Sulfa Antibiotics Nausea And Vomiting     Current Outpatient Medications  Medication Sig Dispense Refill  . acetaminophen (TYLENOL) 325 MG tablet Take 1-2 tablets (325-650 mg total) by mouth every 4 (four) hours as needed for mild pain.    Marland Kitchen albuterol (PROVENTIL HFA;VENTOLIN HFA) 108 (90 Base) MCG/ACT inhaler Inhale 1-2 puffs into the lungs every 6 (six) hours as needed for wheezing or shortness of breath. 1 Inhaler 0  . aspirin 81 MG chewable tablet Chew 81 mg by mouth at bedtime.     . Cholecalciferol (VITAMIN D-3) 25 MCG (1000 UT) CAPS Take 1,000-2,000 Units by mouth daily.    Marland Kitchen gabapentin (NEURONTIN) 600 MG tablet Take 600 mg by mouth 2 (two) times daily.    . hydrochlorothiazide (MICROZIDE) 12.5 MG capsule Take 12.5 mg by mouth daily.    Marland Kitchen HYDROcodone-acetaminophen (NORCO) 10-325 MG tablet Take 1 tablet by mouth 2 (two) times daily.     . INVOKAMET XR 50-500 MG TB24 Take 1 tablet by mouth 2 (two) times daily.    Marland Kitchen losartan (COZAAR) 25 MG tablet Take 0.5 tablets (12.5 mg total) by mouth daily. 30 tablet 1  . lovastatin (MEVACOR) 10 MG tablet Take 10 mg by mouth at bedtime.     . meloxicam (MOBIC) 15 MG tablet Take 15 mg by mouth daily.    . metoprolol tartrate (LOPRESSOR) 25 MG tablet Take 1 tablet (25 mg total) by mouth 2 (two) times daily. 60 tablet 3  . naloxone  (NARCAN) 4 MG/0.1ML LIQD nasal spray kit Place 1 spray into the nose as directed.    . venlafaxine XR (EFFEXOR-XR) 150 MG 24 hr capsule Take 150 mg by mouth daily.     No current facility-administered medications for this visit.     Past Medical History:  Diagnosis Date  . Aortic insufficiency   . Asthma 09/13/2006  . Complete heart block ([place]) 02/11/2019  . Cystocele without uterine prolapse 12/12/2018  . Diabetes mellitus without complication ([place])   . Elevated troponin level   . Hypercholesteremia   . Hypertension   . Primary osteoarthritis of left knee 12/10/2017    ROS:   All systems reviewed and negative except as noted in the HPI.   Past Surgical History:  Procedure Laterality Date  . APPENDECTOMY    . LEFT HEART CATH AND CORONARY ANGIOGRAPHY N/A 09/28/2018   Procedure: LEFT HEART CATH AND CORONARY ANGIOGRAPHY;  Surgeon: Troy Sine, MD;  Location: [facility];  Service: Cardiovascular;  Laterality: N/A;  . PACEMAKER IMPLANT N/A 02/13/2019   Procedure: PACEMAKER IMPLANT;  Surgeon: Evans Lance, MD;  Location: [place] [facility];  Service: Cardiovascular;  Laterality: N/A;  . ROTATOR CUFF REPAIR Bilateral 2017  . SINUS EXPLORATION    . TOTAL KNEE ARTHROPLASTY Left 12/10/2017  Procedure: LEFT TOTAL KNEE ARTHROPLASTY;  Surgeon: Sydnee Cabal, MD;  Location: [facility];  Service: Orthopedics;  Laterality: Left;  Adductor Block  . TUBAL LIGATION       Family History  Problem Relation Age of Onset  . Colon cancer Maternal Grandmother   . Alzheimer's disease Mother      Social History   Socioeconomic History  . Marital status: Married    Spouse name: Not on file  . Number of children: Not on file  . Years of education: Not on file  . Highest education level: Not on file  Occupational History  . Not on file  Tobacco Use  . Smoking status: Never Smoker  . Smokeless tobacco: Never Used  Substance and Sexual Activity  . Alcohol use: No  . Drug use: No  .  Sexual activity: Yes    Birth control/protection: Post-menopausal  Other Topics Concern  . Not on file  Social History Narrative  . Not on file   Social Determinants of Health   Financial Resource Strain:   . Difficulty of Paying Living Expenses: Not on file  Food Insecurity:   . Worried About Charity fundraiser in the Last Year: Not on file  . Ran Out of Food in the Last Year: Not on file  Transportation Needs:   . Lack of Transportation (Medical): Not on file  . Lack of Transportation (Non-Medical): Not on file  Physical Activity:   . Days of Exercise per Week: Not on file  . Minutes of Exercise per Session: Not on file  Stress:   . Feeling of Stress : Not on file  Social Connections:   . Frequency of Communication with Friends and Family: Not on file  . Frequency of Social Gatherings with Friends and Family: Not on file  . Attends Religious Services: Not on file  . Active Member of Clubs or Organizations: Not on file  . Attends Archivist Meetings: Not on file  . Marital Status: Not on file  Intimate Partner Violence:   . Fear of Current or Ex-Partner: Not on file  . Emotionally Abused: Not on file  . Physically Abused: Not on file  . Sexually Abused: Not on file     BP 118/84   Pulse 69   Ht 5' (1.524 m)   Wt 178 lb 9.6 oz (81 kg)   LMP 07/25/2015 (Exact Date)   SpO2 97%   BMI 34.88 kg/m   Physical Exam:  Well appearing overweight middle aged woman, NAD HEENT: Unremarkable Neck:  6 cm JVD, no thyromegally Lymphatics:  No adenopathy Back:  No CVA tenderness Lungs:  Clear with no wheezes; well healed PM incision HEART:  Regular rate rhythm, no murmurs, no rubs, no clicks Abd:  soft, positive bowel sounds, no organomegally, no rebound, no guarding Ext:  2 plus pulses, no edema, no cyanosis, no clubbing Skin:  No rashes no nodules Neuro:  CN II through XII intact, motor grossly intact  EKG - nsr with non-selective his bundle pacing  DEVICE    Normal device function.  See PaceArt for details.   Assess/Plan: 1. CHB - she has no escape today. She is asymptomatic,s/p PPM insertion. 2. PPM - her medtronic DDD PM is working normally. We will recheck in several months. 3. Atrial arrhythmias - she has had brief episodes of atrial fib. She will continue her current meds. I will hold off on systemic anti-coagulation at this point.   Mikle Bosworth.D.

## 2019-05-18 NOTE — Patient Instructions (Signed)
Medication Instructions:  Your physician recommends that you continue on your current medications as directed. Please refer to the Current Medication list given to you today.  Labwork: None ordered.  Testing/Procedures: None ordered.  Follow-Up: Your physician wants you to follow-up in: one year with Dr. Lovena Le.  You will receive a reminder letter in the mail two months in advance. If you don't receive a letter, please call our office to schedule the follow-up appointment.  Remote monitoring is used to monitor your Pacemaker from home. This monitoring reduces the number of office visits required to check your device to one time per year. It allows [place] to keep an eye on the functioning of your device to ensure it is working properly. You are scheduled for a device check from home on 08/18/2019. You may send your transmission at any time that day. If you have a wireless device, the transmission will be sent automatically. After your physician reviews your transmission, you will receive a postcard with your next transmission date.  Any Other Special Instructions Will Be Listed Below (If Applicable).  If you need a refill on your cardiac medications before your next appointment, please call your pharmacy.

## 2019-06-02 NOTE — Addendum Note (Signed)
Addended by: Rose Phi on: 06/02/2019 04:55 PM   Modules accepted: Orders

## 2019-06-21 ENCOUNTER — Telehealth: Payer: Self-pay | Admitting: Internal Medicine

## 2019-06-21 NOTE — Telephone Encounter (Signed)
I spoke to the patient who is calling because she is having active CP, SOB and "profuse" sweating.    I recommended that she call EMS and go to the ED for further evaluation.  She verbalized understanding.

## 2019-06-21 NOTE — Telephone Encounter (Signed)
Pt c/o of Chest Pain: STAT if CP now or developed within 24 hours  1. Are you having CP right now? No  2. Are you experiencing any other symptoms (ex. SOB, nausea, vomiting, sweating)? Lightheadedness, SOB  3. How long have you been experiencing CP? About 1 hour  4. Is your CP continuous or coming and going? Coming and going  5. Have you taken Nitroglycerin? No    ?

## 2019-07-17 ENCOUNTER — Other Ambulatory Visit: Payer: Self-pay | Admitting: Student

## 2019-07-17 DIAGNOSIS — R000 Tachycardia, unspecified: Secondary | ICD-10-CM

## 2019-07-17 NOTE — Telephone Encounter (Signed)
Not a chf clinic pt

## 2019-07-17 NOTE — Telephone Encounter (Signed)
Not a chf clinic pt.

## 2019-08-15 ENCOUNTER — Emergency Department (HOSPITAL_COMMUNITY)
Admission: EM | Admit: 2019-08-15 | Discharge: 2019-08-15 | Disposition: A | Discharge status: Home / Self Care | Payer: Medicare Other | Attending: Emergency Medicine | Admitting: Emergency Medicine

## 2019-08-15 ENCOUNTER — Other Ambulatory Visit: Payer: Self-pay

## 2019-08-15 ENCOUNTER — Emergency Department (HOSPITAL_COMMUNITY): Payer: Medicare Other

## 2019-08-15 ENCOUNTER — Encounter (HOSPITAL_COMMUNITY): Payer: Self-pay | Admitting: Pediatrics

## 2019-08-15 DIAGNOSIS — R05 Cough: Secondary | ICD-10-CM | POA: Insufficient documentation

## 2019-08-15 DIAGNOSIS — R200 Anesthesia of skin: Secondary | ICD-10-CM | POA: Diagnosis not present

## 2019-08-15 DIAGNOSIS — Z5321 Procedure and treatment not carried out due to patient leaving prior to being seen by health care provider: Secondary | ICD-10-CM | POA: Insufficient documentation

## 2019-08-15 LAB — COMPREHENSIVE METABOLIC PANEL
ALT: 20 U/L (ref 0–44)
AST: 32 U/L (ref 15–41)
Albumin: 3.7 g/dL (ref 3.5–5.0)
Alkaline Phosphatase: 63 U/L (ref 38–126)
Anion gap: 15 (ref 5–15)
BUN: 12 mg/dL (ref 6–20)
CO2: 22 mmol/L (ref 22–32)
Calcium: 9 mg/dL (ref 8.9–10.3)
Chloride: 99 mmol/L (ref 98–111)
Creatinine, Ser: 0.92 mg/dL (ref 0.44–1.00)
GFR calc Af Amer: >60 mL/min (ref >60)
GFR calc non Af Amer: >60 mL/min (ref >60)
Glucose, Bld: 89 mg/dL (ref 70–99)
Potassium: 3.5 mmol/L (ref 3.5–5.1)
Sodium: 136 mmol/L (ref 135–145)
Total Bilirubin: 1.3 mg/dL — ABNORMAL HIGH (ref 0.3–1.2)
Total Protein: 7.2 g/dL (ref 6.5–8.1)

## 2019-08-15 LAB — CBC WITH DIFFERENTIAL/PLATELET
Abs Immature Granulocytes: 0.02 10*3/uL (ref 0.00–0.07)
Basophils Absolute: 0 10*3/uL (ref 0.0–0.1)
Basophils Relative: 0 %
Eosinophils Absolute: 0 10*3/uL (ref 0.0–0.5)
Eosinophils Relative: 0 %
HCT: 51 % — ABNORMAL HIGH (ref 36.0–46.0)
Hemoglobin: 16.3 g/dL — ABNORMAL HIGH (ref 12.0–15.0)
Immature Granulocytes: 0 %
Lymphocytes Relative: 31 %
Lymphs Abs: 2 10*3/uL (ref 0.7–4.0)
MCH: 27.9 pg (ref 26.0–34.0)
MCHC: 32 g/dL (ref 30.0–36.0)
MCV: 87.3 fL (ref 80.0–100.0)
Monocytes Absolute: 0.6 10*3/uL (ref 0.1–1.0)
Monocytes Relative: 10 %
Neutro Abs: 3.7 10*3/uL (ref 1.7–7.7)
Neutrophils Relative %: 59 %
Platelets: 183 10*3/uL (ref 150–400)
RBC: 5.84 MIL/uL — ABNORMAL HIGH (ref 3.87–5.11)
RDW: 13.9 % (ref 11.5–15.5)
WBC: 6.3 10*3/uL (ref 4.0–10.5)
nRBC: 0 % (ref 0.0–0.2)

## 2019-08-15 NOTE — ED Notes (Signed)
Patient did not answer.  

## 2019-08-15 NOTE — ED Notes (Signed)
Pt called several times, no answer, marked LWBS

## 2019-08-15 NOTE — ED Notes (Signed)
Pt complaining of bilateral hand numbness and burning. No weakness, unilateral deficits, or other nuero complaints. Charge aware

## 2019-08-15 NOTE — ED Triage Notes (Signed)
Pt added, "I feel dehydrated and haven't had anything to eat / drink for two days because it makes me nauseous".

## 2019-08-15 NOTE — ED Triage Notes (Signed)
C/O upper respiratory symptoms for couple of weeks; stated recently tested negative for Covid in the last month.

## 2019-08-16 ENCOUNTER — Emergency Department (HOSPITAL_COMMUNITY): Payer: Medicare Other

## 2019-08-16 ENCOUNTER — Other Ambulatory Visit: Payer: Self-pay

## 2019-08-16 ENCOUNTER — Emergency Department (HOSPITAL_COMMUNITY)
Admission: EM | Admit: 2019-08-16 | Discharge: 2019-08-16 | Disposition: A | Discharge status: Home / Self Care | Payer: Medicare Other | Attending: Emergency Medicine | Admitting: Emergency Medicine

## 2019-08-16 ENCOUNTER — Encounter (HOSPITAL_COMMUNITY): Payer: Self-pay | Admitting: Student

## 2019-08-16 DIAGNOSIS — Z950 Presence of cardiac pacemaker: Secondary | ICD-10-CM | POA: Diagnosis not present

## 2019-08-16 DIAGNOSIS — J45909 Unspecified asthma, uncomplicated: Secondary | ICD-10-CM | POA: Diagnosis not present

## 2019-08-16 DIAGNOSIS — U071 COVID-19: Secondary | ICD-10-CM | POA: Insufficient documentation

## 2019-08-16 DIAGNOSIS — Z20822 Contact with and (suspected) exposure to covid-19: Secondary | ICD-10-CM

## 2019-08-16 DIAGNOSIS — I10 Essential (primary) hypertension: Secondary | ICD-10-CM | POA: Diagnosis not present

## 2019-08-16 DIAGNOSIS — Z96652 Presence of left artificial knee joint: Secondary | ICD-10-CM | POA: Insufficient documentation

## 2019-08-16 DIAGNOSIS — R509 Fever, unspecified: Secondary | ICD-10-CM | POA: Diagnosis present

## 2019-08-16 DIAGNOSIS — E119 Type 2 diabetes mellitus without complications: Secondary | ICD-10-CM | POA: Diagnosis not present

## 2019-08-16 LAB — CBC
HCT: 50.1 % — ABNORMAL HIGH (ref 36.0–46.0)
Hemoglobin: 16 g/dL — ABNORMAL HIGH (ref 12.0–15.0)
MCH: 27.3 pg (ref 26.0–34.0)
MCHC: 31.9 g/dL (ref 30.0–36.0)
MCV: 85.3 fL (ref 80.0–100.0)
Platelets: 182 10*3/uL (ref 150–400)
RBC: 5.87 MIL/uL — ABNORMAL HIGH (ref 3.87–5.11)
RDW: 14 % (ref 11.5–15.5)
WBC: 5.4 10*3/uL (ref 4.0–10.5)
nRBC: 0 % (ref 0.0–0.2)

## 2019-08-16 LAB — COMPREHENSIVE METABOLIC PANEL
ALT: 19 U/L (ref 0–44)
AST: 38 U/L (ref 15–41)
Albumin: 3.8 g/dL (ref 3.5–5.0)
Alkaline Phosphatase: 55 U/L (ref 38–126)
Anion gap: 13 (ref 5–15)
BUN: 22 mg/dL — ABNORMAL HIGH (ref 6–20)
CO2: 23 mmol/L (ref 22–32)
Calcium: 8.9 mg/dL (ref 8.9–10.3)
Chloride: 98 mmol/L (ref 98–111)
Creatinine, Ser: 0.83 mg/dL (ref 0.44–1.00)
GFR calc Af Amer: >60 mL/min (ref >60)
GFR calc non Af Amer: >60 mL/min (ref >60)
Glucose, Bld: 89 mg/dL (ref 70–99)
Potassium: 3.7 mmol/L (ref 3.5–5.1)
Sodium: 134 mmol/L — ABNORMAL LOW (ref 135–145)
Total Bilirubin: 1.2 mg/dL (ref 0.3–1.2)
Total Protein: 7.2 g/dL (ref 6.5–8.1)

## 2019-08-16 LAB — SARS CORONAVIRUS 2 (TAT 6-24 HRS): SARS Coronavirus 2: POSITIVE — AB

## 2019-08-16 MED ORDER — SODIUM CHLORIDE 0.9 % IV BOLUS
1000.0000 mL | Freq: Once | INTRAVENOUS | Status: AC
  Administered 2019-08-16: 09:00:00 1000 mL via INTRAVENOUS

## 2019-08-16 MED ORDER — DIPHENHYDRAMINE HCL 50 MG/ML IJ SOLN
25.0000 mg | Freq: Once | INTRAMUSCULAR | Status: AC
  Administered 2019-08-16: 25 mg via INTRAVENOUS
  Filled 2019-08-16: qty 1

## 2019-08-16 MED ORDER — PROCHLORPERAZINE EDISYLATE 10 MG/2ML IJ SOLN
10.0000 mg | Freq: Once | INTRAMUSCULAR | Status: AC
  Administered 2019-08-16: 10 mg via INTRAVENOUS
  Filled 2019-08-16: qty 2

## 2019-08-16 MED ORDER — ACETAMINOPHEN 500 MG PO TABS
1000.0000 mg | ORAL_TABLET | Freq: Once | ORAL | Status: AC
  Administered 2019-08-16: 1000 mg via ORAL
  Filled 2019-08-16: qty 2

## 2019-08-16 NOTE — ED Provider Notes (Signed)
[facility] Emergency Department Provider Note MRN:  [id number]  Arrival date & time: 08/16/19     Chief Complaint   Covid exposure History of Present Illness   Lisa Poole is a 54 y.o. year-old female with a history of complete heart block status post pacemaker, diabetes presenting to the ED with chief complaint of fever.  Patient explains that her coworker seem to be sick while at work for a few weeks.  This coworker recently tested positive for coronavirus.  Patient has been experiencing flu or cold-like symptoms for over 2 weeks.  Fever, cough, generalized weakness, malaise, nasal congestion, sore throat, body aches.  She denies shortness of breath, no chest pain.  Dull frontal headache.  No nausea or vomiting, mild diarrhea.  In general feels terrible.  Has had little to no food for about 4 days, low appetite.  Review of Systems  A complete 10 system review of systems was obtained and all systems are negative except as noted in the HPI and PMH.   Patient's Health History    Past Medical History:  Diagnosis Date  . Aortic insufficiency   . Asthma 09/13/2006  . Complete heart block ([place]) 02/11/2019  . Cystocele without uterine prolapse 12/12/2018  . Diabetes mellitus without complication ([place])   . Elevated troponin level   . Hypercholesteremia   . Hypertension   . Primary osteoarthritis of left knee 12/10/2017    Past Surgical History:  Procedure Laterality Date  . APPENDECTOMY    . LEFT HEART CATH AND CORONARY ANGIOGRAPHY N/A 09/28/2018   Procedure: LEFT HEART CATH AND CORONARY ANGIOGRAPHY;  Surgeon: Troy Sine, MD;  Location: [place] [facility];  Service: Cardiovascular;  Laterality: N/A;  . PACEMAKER IMPLANT N/A 02/13/2019   Procedure: PACEMAKER IMPLANT;  Surgeon: Evans Lance, MD;  Location: [place] [facility];  Service: Cardiovascular;  Laterality: N/A;  . PACEMAKER IMPLANT    . ROTATOR CUFF REPAIR Bilateral 2017  . SINUS EXPLORATION    .  TOTAL KNEE ARTHROPLASTY Left 12/10/2017   Procedure: LEFT TOTAL KNEE ARTHROPLASTY;  Surgeon: Sydnee Cabal, MD;  Location: [facility];  Service: Orthopedics;  Laterality: Left;  Adductor Block  . TUBAL LIGATION      Family History  Problem Relation Age of Onset  . Colon cancer Maternal Grandmother   . Alzheimer's disease Mother     Social History   Socioeconomic History  . Marital status: Married    Spouse name: Not on file  . Number of children: Not on file  . Years of education: Not on file  . Highest education level: Not on file  Occupational History  . Not on file  Tobacco Use  . Smoking status: Never Smoker  . Smokeless tobacco: Never Used  Substance and Sexual Activity  . Alcohol use: No  . Drug use: No  . Sexual activity: Yes    Birth control/protection: Post-menopausal  Other Topics Concern  . Not on file  Social History Narrative  . Not on file   Social Determinants of Health   Financial Resource Strain:   . Difficulty of Paying Living Expenses:   Food Insecurity:   . Worried About Charity fundraiser in the Last Year:   . Arboriculturist in the Last Year:   Transportation Needs:   . Film/video editor (Medical):   Marland Kitchen Lack of Transportation (Non-Medical):   Physical Activity:   . Days of Exercise per Week:   .  Minutes of Exercise per Session:   Stress:   . Feeling of Stress :   Social Connections:   . Frequency of Communication with Friends and Family:   . Frequency of Social Gatherings with Friends and Family:   . Attends Religious Services:   . Active Member of Clubs or Organizations:   . Attends Archivist Meetings:   Marland Kitchen Marital Status:   Intimate Partner Violence:   . Fear of Current or Ex-Partner:   . Emotionally Abused:   Marland Kitchen Physically Abused:   . Sexually Abused:      Physical Exam   Vitals:   08/16/19 0827  BP: 109/69  Pulse: 64  Resp: 15  Temp: 98.8 F (37.1 C)  SpO2: 97%    CONSTITUTIONAL: Well-appearing, NAD, appears  tired NEURO:  Alert and oriented x 3, no focal deficits EYES:  eyes equal and reactive ENT/NECK:  no LAD, no JVD CARDIO: Regular rate, well-perfused, normal S1 and S2 PULM:  CTAB no wheezing or rhonchi GI/GU:  normal bowel sounds, non-distended, non-tender MSK/SPINE:  No gross deformities, no edema SKIN:  no rash, atraumatic PSYCH:  Appropriate speech and behavior  *Additional and/or pertinent findings included in MDM below  Diagnostic and Interventional Summary    EKG Interpretation  Date/Time:  Wednesday August 16 2019 09:00:52 EDT Ventricular Rate:  60 PR Interval:    QRS Duration: 130 QT Interval:  472 QTC Calculation: 472 R Axis:   50 Text Interpretation: Sinus rhythm Prolonged PR interval Nonspecific intraventricular conduction delay Probable lateral infarct, age indeterminate Confirmed by Gerlene Fee [phone]) on 08/16/2019 9:16:55 AM      Labs Reviewed  CBC - Abnormal; Notable for the following components:      Result Value   RBC 5.87 (*)    Hemoglobin 16.0 (*)    HCT 50.1 (*)    All other components within normal limits  COMPREHENSIVE METABOLIC PANEL - Abnormal; Notable for the following components:   Sodium 134 (*)    BUN 22 (*)    All other components within normal limits  SARS CORONAVIRUS 2 (TAT 6-24 HRS)  HCG, SERUM, QUALITATIVE    DG Chest Port 1 View  Final Result      Medications  sodium chloride 0.9 % bolus 1,000 mL (1,000 mLs Intravenous Bolus from Bag 08/16/19 0904)  acetaminophen (TYLENOL) tablet 1,000 mg (1,000 mg Oral Given 08/16/19 0906)  diphenhydrAMINE (BENADRYL) injection 25 mg (25 mg Intravenous Given 08/16/19 0905)  prochlorperazine (COMPAZINE) injection 10 mg (10 mg Intravenous Given 08/16/19 6045)     Procedures  /  Critical Care Procedures  ED Course and Medical Decision Making  I have reviewed the triage vital signs, the nursing notes, and pertinent available records from the EMR.  Listed above are laboratory and imaging tests that I  personally ordered, reviewed, and interpreted and then considered in my medical decision making (see below for details).      Symptoms highly suggestive of viral illness, likely COVID-19 given her exposure.  Given her comorbidities and her lack of p.o. intake, will obtain screening labs to exclude electrolyte disturbance.  Work-up is reassuring, patient is feeling better, continues to have no hypoxia, no increased work of breathing, chest x-ray is clear.  She is appropriate for discharge with continued home quarantine and symptomatic management.  Barth Kirks. Sedonia Small, [place] [email or web address]  Final Clinical Impressions(s) / ED Diagnoses     ICD-10-CM   1. Suspected COVID-19  virus infection  F12.527     ED Discharge Orders    None       Discharge Instructions Discussed with and Provided to Patient:     Discharge Instructions     You were evaluated in the Emergency Department and after careful evaluation, we did not find any emergent condition requiring admission or further testing in the hospital.  Your exam/testing today is overall reassuring.  Your symptoms seem to be due to a viral illness, possibly the coronavirus.  We have tested you for the coronavirus here in the Emergency Department.  Please isolate or quarantine at home until you receive a negative test result.  If positive, we recommend continued home quarantine per [facility] recommendations.   Please return to the Emergency Department if you experience any worsening of your condition.  We encourage you to follow up with a primary care provider.  Thank you for allowing [place] to be a part of your care.       Maudie Flakes, MD 08/16/19 1013

## 2019-08-16 NOTE — ED Notes (Addendum)
Patient works at [employer] and her boss recently tested positive for [place].  She reports intermittent fevers, chills, nasal congestion, non-productive cough, and body aches. She reports symptoms are ongoing for 3 weeks.  Patient is ambulatory in triage.

## 2019-08-16 NOTE — Discharge Instructions (Signed)
You were evaluated in the Emergency Department and after careful evaluation, we did not find any emergent condition requiring admission or further testing in the hospital.  Your exam/testing today is overall reassuring.  Your symptoms seem to be due to a viral illness, possibly the coronavirus.  We have tested you for the coronavirus here in the Emergency Department.  Please isolate or quarantine at home until you receive a negative test result.  If positive, we recommend continued home quarantine per [facility] recommendations.   Please return to the Emergency Department if you experience any worsening of your condition.  We encourage you to follow up with a primary care provider.  Thank you for allowing [place] to be a part of your care.

## 2019-08-16 NOTE — ED Notes (Signed)
MD at bedside. 

## 2019-08-17 ENCOUNTER — Telehealth: Payer: Self-pay | Admitting: Physician Assistant

## 2019-08-17 NOTE — Telephone Encounter (Signed)
Called to discuss with [name] about Covid symptoms and the use of bamlanivimab/etesevimab or casirivimab/imdevimab, a monoclonal antibody infusion for those with mild to moderate Covid symptoms and at a high risk of hospitalization.    Pt does not qualify for infusion therapy as pt's symptoms first presented > 10 days prior to timing of infusion. Symptoms tier reviewed as well as criteria for ending isolation. Preventative practices reviewed. Patient verbalized understanding     Patient Active Problem List   Diagnosis Date Noted  . Pacemaker 05/18/2019  . Uterovaginal prolapse, incomplete 02/15/2019  . Complete heart block ([place]) 02/11/2019  . Cystocele without uterine prolapse 12/12/2018  . Elevated troponin level   . Chest discomfort   . NSTEMI (non-ST elevated myocardial infarction) ([place]) 09/27/2018  . Primary osteoarthritis of left knee 12/10/2017  . S/P knee replacement 12/10/2017  . Pain in left knee 10/20/2017  . Tear of right rotator cuff 05/20/2017  . Acromioclavicular arthrosis 08/01/2013  . Aortic insufficiency   . Dry eye syndrome 11/25/2007  . Asthma 09/13/2006  . Diabetes mellitus ([place]) 09/13/2006  . HTN (hypertension) 09/13/2006    Angelena Form PA-C

## 2019-08-18 ENCOUNTER — Ambulatory Visit (INDEPENDENT_AMBULATORY_CARE_PROVIDER_SITE_OTHER): Payer: Medicare Other | Admitting: *Deleted

## 2019-08-18 DIAGNOSIS — Z950 Presence of cardiac pacemaker: Secondary | ICD-10-CM | POA: Diagnosis not present

## 2019-08-18 LAB — CUP PACEART REMOTE DEVICE CHECK
Battery Remaining Longevity: 126 mo
Battery Voltage: 3.09 V
Brady Statistic AP VP Percent: 20.23 %
Brady Statistic AP VS Percent: 0.08 %
Brady Statistic AS VP Percent: 74.32 %
Brady Statistic AS VS Percent: 5.38 %
Brady Statistic RA Percent Paced: 22.43 %
Brady Statistic RV Percent Paced: 94.55 %
Date Time Interrogation Session: 20210409051312
Implantable Lead Implant Date: 20201005
Implantable Lead Implant Date: 20201005
Implantable Lead Location: 753859
Implantable Lead Location: 753860
Implantable Lead Model: 3830
Implantable Lead Model: 5076
Implantable Pulse Generator Implant Date: 20201005
Lead Channel Impedance Value: 285 Ohm
Lead Channel Impedance Value: 380 Ohm
Lead Channel Impedance Value: 399 Ohm
Lead Channel Impedance Value: 513 Ohm
Lead Channel Pacing Threshold Amplitude: 0.625 V
Lead Channel Pacing Threshold Amplitude: 1 V
Lead Channel Pacing Threshold Pulse Width: 0.4 ms
Lead Channel Pacing Threshold Pulse Width: 0.4 ms
Lead Channel Sensing Intrinsic Amplitude: 2.75 mV
Lead Channel Sensing Intrinsic Amplitude: 2.75 mV
Lead Channel Sensing Intrinsic Amplitude: 5.625 mV
Lead Channel Sensing Intrinsic Amplitude: 5.625 mV
Lead Channel Setting Pacing Amplitude: 1.5 V
Lead Channel Setting Pacing Amplitude: 2.5 V
Lead Channel Setting Pacing Pulse Width: 0.4 ms
Lead Channel Setting Sensing Sensitivity: 4 mV

## 2019-08-18 NOTE — Progress Notes (Signed)
PPM Remote  

## 2019-10-20 ENCOUNTER — Emergency Department (HOSPITAL_COMMUNITY)
Admission: EM | Admit: 2019-10-20 | Discharge: 2019-10-20 | Disposition: A | Discharge status: Home / Self Care | Payer: Medicare Other | Attending: Emergency Medicine | Admitting: Emergency Medicine

## 2019-10-20 ENCOUNTER — Other Ambulatory Visit: Payer: Self-pay

## 2019-10-20 ENCOUNTER — Encounter (HOSPITAL_COMMUNITY): Payer: Self-pay | Admitting: *Deleted

## 2019-10-20 DIAGNOSIS — J45909 Unspecified asthma, uncomplicated: Secondary | ICD-10-CM | POA: Diagnosis not present

## 2019-10-20 DIAGNOSIS — E119 Type 2 diabetes mellitus without complications: Secondary | ICD-10-CM | POA: Insufficient documentation

## 2019-10-20 DIAGNOSIS — R112 Nausea with vomiting, unspecified: Secondary | ICD-10-CM | POA: Diagnosis not present

## 2019-10-20 DIAGNOSIS — Z7984 Long term (current) use of oral hypoglycemic drugs: Secondary | ICD-10-CM | POA: Diagnosis not present

## 2019-10-20 DIAGNOSIS — Z79899 Other long term (current) drug therapy: Secondary | ICD-10-CM | POA: Insufficient documentation

## 2019-10-20 DIAGNOSIS — Z96652 Presence of left artificial knee joint: Secondary | ICD-10-CM | POA: Diagnosis not present

## 2019-10-20 DIAGNOSIS — Z7982 Long term (current) use of aspirin: Secondary | ICD-10-CM | POA: Diagnosis not present

## 2019-10-20 DIAGNOSIS — I10 Essential (primary) hypertension: Secondary | ICD-10-CM | POA: Insufficient documentation

## 2019-10-20 DIAGNOSIS — Z888 Allergy status to other drugs, medicaments and biological substances status: Secondary | ICD-10-CM | POA: Insufficient documentation

## 2019-10-20 DIAGNOSIS — Z79891 Long term (current) use of opiate analgesic: Secondary | ICD-10-CM | POA: Diagnosis not present

## 2019-10-20 DIAGNOSIS — R109 Unspecified abdominal pain: Secondary | ICD-10-CM | POA: Diagnosis not present

## 2019-10-20 DIAGNOSIS — R1110 Vomiting, unspecified: Secondary | ICD-10-CM | POA: Diagnosis present

## 2019-10-20 LAB — COMPREHENSIVE METABOLIC PANEL
ALT: 33 U/L (ref 0–44)
AST: 30 U/L (ref 15–41)
Albumin: 3.9 g/dL (ref 3.5–5.0)
Alkaline Phosphatase: 58 U/L (ref 38–126)
Anion gap: 13 (ref 5–15)
BUN: 29 mg/dL — ABNORMAL HIGH (ref 6–20)
CO2: 26 mmol/L (ref 22–32)
Calcium: 9.1 mg/dL (ref 8.9–10.3)
Chloride: 104 mmol/L (ref 98–111)
Creatinine, Ser: 1.1 mg/dL — ABNORMAL HIGH (ref 0.44–1.00)
GFR calc Af Amer: >60 mL/min (ref >60)
GFR calc non Af Amer: 56 mL/min — ABNORMAL LOW (ref >60)
Glucose, Bld: 115 mg/dL — ABNORMAL HIGH (ref 70–99)
Potassium: 4.1 mmol/L (ref 3.5–5.1)
Sodium: 143 mmol/L (ref 135–145)
Total Bilirubin: 1.2 mg/dL (ref 0.3–1.2)
Total Protein: 6.7 g/dL (ref 6.5–8.1)

## 2019-10-20 LAB — CBC WITH DIFFERENTIAL/PLATELET
Abs Immature Granulocytes: 0.01 10*3/uL (ref 0.00–0.07)
Basophils Absolute: 0.1 10*3/uL (ref 0.0–0.1)
Basophils Relative: 1 %
Eosinophils Absolute: 0.2 10*3/uL (ref 0.0–0.5)
Eosinophils Relative: 3 %
HCT: 43.9 % (ref 36.0–46.0)
Hemoglobin: 13.5 g/dL (ref 12.0–15.0)
Immature Granulocytes: 0 %
Lymphocytes Relative: 21 %
Lymphs Abs: 1.5 10*3/uL (ref 0.7–4.0)
MCH: 28.6 pg (ref 26.0–34.0)
MCHC: 30.8 g/dL (ref 30.0–36.0)
MCV: 93 fL (ref 80.0–100.0)
Monocytes Absolute: 0.4 10*3/uL (ref 0.1–1.0)
Monocytes Relative: 7 %
Neutro Abs: 4.6 10*3/uL (ref 1.7–7.7)
Neutrophils Relative %: 68 %
Platelets: 331 10*3/uL (ref 150–400)
RBC: 4.72 MIL/uL (ref 3.87–5.11)
RDW: 16.6 % — ABNORMAL HIGH (ref 11.5–15.5)
WBC: 6.8 10*3/uL (ref 4.0–10.5)
nRBC: 0 % (ref 0.0–0.2)

## 2019-10-20 LAB — LIPASE, BLOOD: Lipase: 31 U/L (ref 11–51)

## 2019-10-20 MED ORDER — ONDANSETRON 4 MG PO TBDP: 4.0000 mg | ORAL_TABLET | Freq: Three times a day (TID) | ORAL | 0 refills | Status: DC | PRN

## 2019-10-20 NOTE — ED Provider Notes (Signed)
[facility] DEPT Provider Note   CSN: [id number] Arrival date & time: 10/20/19  2951     History Chief Complaint  Patient presents with  . Emesis  . Fatigue    Lisa Poole is a 54 y.o. female.  Presents to ER with concern for episode of emesis.  Patient reports over the past 2 months she has had increased fatigue generally, has occasional episodes of vomiting, states vomit happens sometimes a couple times a day, sometimes only every other day.  Has been followed by gastroenterology, has endoscopy scheduled for 6/21.  States this morning she had one of her episodes of vomiting and noted small black appearing material.  No blood.  No ongoing nausea.  Did have some mild abdominal discomfort when she had the episode of vomiting, no ongoing abdominal pain.  No constipation or diarrhea.  Is not currently on any medication by GI.  [name].  Did not eat anything this morning, had a sandwich for dinner last night.  HPI     Past Medical History:  Diagnosis Date  . Aortic insufficiency   . Asthma 09/13/2006  . Complete heart block ([place]) 02/11/2019  . Cystocele without uterine prolapse 12/12/2018  . Diabetes mellitus without complication ([place])   . Elevated troponin level   . Hypercholesteremia   . Hypertension   . Primary osteoarthritis of left knee 12/10/2017    Patient Active Problem List   Diagnosis Date Noted  . Pacemaker 05/18/2019  . Uterovaginal prolapse, incomplete 02/15/2019  . Complete heart block ([place]) 02/11/2019  . Cystocele without uterine prolapse 12/12/2018  . Elevated troponin level   . Chest discomfort   . NSTEMI (non-ST elevated myocardial infarction) ([place]) 09/27/2018  . Primary osteoarthritis of left knee 12/10/2017  . S/P knee replacement 12/10/2017  . Pain in left knee 10/20/2017  . Tear of right rotator cuff 05/20/2017  . Acromioclavicular arthrosis 08/01/2013  . Aortic insufficiency   . Dry eye syndrome 11/25/2007  . Asthma  09/13/2006  . Diabetes mellitus ([place]) 09/13/2006  . HTN (hypertension) 09/13/2006    Past Surgical History:  Procedure Laterality Date  . APPENDECTOMY    . LEFT HEART CATH AND CORONARY ANGIOGRAPHY N/A 09/28/2018   Procedure: LEFT HEART CATH AND CORONARY ANGIOGRAPHY;  Surgeon: Troy Sine, MD;  Location: [facility];  Service: Cardiovascular;  Laterality: N/A;  . PACEMAKER IMPLANT N/A 02/13/2019   Procedure: PACEMAKER IMPLANT;  Surgeon: Evans Lance, MD;  Location: [facility];  Service: Cardiovascular;  Laterality: N/A;  . PACEMAKER IMPLANT    . ROTATOR CUFF REPAIR Bilateral 2017  . SINUS EXPLORATION    . TOTAL KNEE ARTHROPLASTY Left 12/10/2017   Procedure: LEFT TOTAL KNEE ARTHROPLASTY;  Surgeon: Sydnee Cabal, MD;  Location: [facility];  Service: Orthopedics;  Laterality: Left;  Adductor Block  . TUBAL LIGATION       OB History    Gravida  5   Para  4   Term  1   Preterm  3   AB  1   Living  4     SAB  1   TAB  0   Ectopic  0   Multiple  0   Live Births  4           Family History  Problem Relation Age of Onset  . Colon cancer Maternal Grandmother   . Alzheimer's disease Mother     Social History   Tobacco Use  .  Smoking status: Never Smoker  . Smokeless tobacco: Never Used  Vaping Use  . Vaping Use: Never used  Substance Use Topics  . Alcohol use: No  . Drug use: No    Home Medications Prior to Admission medications   Medication Sig Start Date End Date Taking? Authorizing Provider  acetaminophen (TYLENOL) 325 MG tablet Take 1-2 tablets (325-650 mg total) by mouth every 4 (four) hours as needed for mild pain. 02/14/19  Yes Shirley Friar, PA-C  albuterol (PROVENTIL HFA;VENTOLIN HFA) 108 (90 Base) MCG/ACT inhaler Inhale 1-2 puffs into the lungs every 6 (six) hours as needed for wheezing or shortness of breath. 07/30/18  Yes Drenda Freeze, MD  aspirin 81 MG chewable tablet Chew 81 mg by mouth at bedtime.    Yes  [provider]  Cholecalciferol (VITAMIN D-3) 25 MCG (1000 UT) CAPS Take 1,000-2,000 Units by mouth daily.   Yes [provider]  gabapentin (NEURONTIN) 600 MG tablet Take 600 mg by mouth 2 (two) times daily.   Yes [provider]  hydrochlorothiazide (HYDRODIURIL) 25 MG tablet Take 25 mg by mouth daily. 09/25/19  Yes [provider]  HYDROcodone-acetaminophen (NORCO) 10-325 MG tablet Take 1 tablet by mouth 2 (two) times daily.    Yes [provider]  losartan (COZAAR) 25 MG tablet Take 0.5 tablets (12.5 mg total) by mouth daily. 09/29/18  Yes Reino Bellis B, NP  lovastatin (MEVACOR) 10 MG tablet Take 10 mg by mouth at bedtime.    Yes [provider]  meloxicam (MOBIC) 15 MG tablet Take 15 mg by mouth daily. 02/05/19  Yes [provider]  metFORMIN (GLUCOPHAGE) 500 MG tablet Take 500 mg by mouth 2 (two) times daily. 07/26/19  Yes [provider]  metoprolol tartrate (LOPRESSOR) 25 MG tablet TAKE 1 TABLET BY MOUTH TWICE DAILY Patient taking differently: Take 25 mg by mouth 2 (two) times daily.  07/17/19  Yes Shirley Friar, PA-C  naloxone [facility]) 4 MG/0.1ML LIQD nasal spray kit Place 1 spray into the nose as directed.   Yes [provider]  venlafaxine XR (EFFEXOR-XR) 150 MG 24 hr capsule Take 150 mg by mouth daily. 05/07/19  Yes [provider]  ondansetron (ZOFRAN ODT) 4 MG disintegrating tablet Take 1 tablet (4 mg total) by mouth every 8 (eight) hours as needed for nausea or vomiting. 10/20/19   Lucrezia Starch, MD    Allergies    Lisinopril, Ace inhibitors, Augmentin [amoxicillin-pot clavulanate], Benazepril, Lotensin [benazepril hcl], Oxycodone-acetaminophen, and Sulfa antibiotics  Review of Systems   Review of Systems  Constitutional: Negative for chills and fever.  HENT: Negative for ear pain and sore throat.   Eyes: Negative for pain and visual disturbance.  Respiratory: Negative for  cough and shortness of breath.   Cardiovascular: Negative for chest pain and palpitations.  Gastrointestinal: Positive for abdominal pain, nausea and vomiting.  Genitourinary: Negative for dysuria and hematuria.  Musculoskeletal: Negative for arthralgias and back pain.  Skin: Negative for color change and rash.  Neurological: Negative for seizures and syncope.  All other systems reviewed and are negative.   Physical Exam Updated Vital Signs BP 118/80 (BP Location: Left Arm)   Pulse 80   Temp 97.8 F (36.6 C) (Oral)   Resp 18   Ht 5' (1.524 m)   Wt 79.8 kg   LMP 07/25/2015 (Exact Date)   SpO2 98%   BMI 34.37 kg/m   Physical Exam Vitals and nursing note reviewed.  Constitutional:  General: She is not in acute distress.    Appearance: She is well-developed.  HENT:     Head: Normocephalic and atraumatic.  Eyes:     Conjunctiva/sclera: Conjunctivae normal.  Cardiovascular:     Rate and Rhythm: Normal rate and regular rhythm.     Heart sounds: No murmur heard.   Pulmonary:     Effort: Pulmonary effort is normal. No respiratory distress.     Breath sounds: Normal breath sounds.  Abdominal:     Palpations: Abdomen is soft.     Tenderness: There is no abdominal tenderness.  Musculoskeletal:        General: No deformity or signs of injury.     Cervical back: Neck supple.  Skin:    General: Skin is warm and dry.  Neurological:     General: No focal deficit present.     Mental Status: She is alert and oriented to person, place, and time.  Psychiatric:        Mood and Affect: Mood normal.        Behavior: Behavior normal.     ED Results / Procedures / Treatments   Labs (all labs ordered are listed, but only abnormal results are displayed) Labs Reviewed  CBC WITH DIFFERENTIAL/PLATELET - Abnormal; Notable for the following components:      Result Value   RDW 16.6 (*)    All other components within normal limits  COMPREHENSIVE METABOLIC PANEL - Abnormal; Notable  for the following components:   Glucose, Bld 115 (*)    BUN 29 (*)    Creatinine, Ser 1.10 (*)    GFR calc non Af Amer 56 (*)    All other components within normal limits  LIPASE, BLOOD    EKG None  Radiology No results found.  Procedures Procedures (including critical care time)  Medications Ordered in ED Medications - No data to display  ED Course  I have reviewed the triage vital signs and the nursing notes.  Pertinent labs & imaging results that were available during my care of the patient were reviewed by me and considered in my medical decision making (see chart for details).    MDM Rules/Calculators/A&P                          54 year old lady presents to ER after she had an episode of emesis this morning.  She has no ongoing symptoms in ER, is well-appearing with no abdominal tenderness.  Lab work was grossly normal.  Seems like symptoms have been going on for quite some time, she is already established GI who is planning for outpatient endoscopy.  Given her reassuring work-up, reassuring exam, do not feel patient requires additional work-up today.  She is tolerating p.o. without difficulty and remains asymptomatic.  Will discharge home, recommend follow-up with GI.    After the discussed management above, the patient was determined to be safe for discharge.  The patient was in agreement with this plan and all questions regarding their care were answered.  ED return precautions were discussed and the patient will return to the ED with any significant worsening of condition.    Final Clinical Impression(s) / ED Diagnoses Final diagnoses:  Non-intractable vomiting with nausea, unspecified vomiting type    Rx / DC Orders ED Discharge Orders         Ordered    ondansetron (ZOFRAN ODT) 4 MG disintegrating tablet  Every 8 hours PRN  Discontinue  Reprint     10/20/19 1126           Lucrezia Starch, MD 10/20/19 [phone]

## 2019-10-20 NOTE — Discharge Instructions (Signed)
Please follow-up with your gastroenterologist regarding the episode of vomiting that you experience today.  If you develop worsening vomiting, abdominal pain or other new concerning symptom, please return to ER for reassessment.

## 2019-10-20 NOTE — ED Triage Notes (Signed)
Pt vomited at work and and was concerned about something she vomited up. She has had nausea, emesis for the past month, fatigue for more than 2 months. Pt does not vomit everyday. Pt has pain in her upper abdomen for more than a month. She has endoscopy scheduled for 6/21.

## 2019-11-17 ENCOUNTER — Ambulatory Visit (INDEPENDENT_AMBULATORY_CARE_PROVIDER_SITE_OTHER): Payer: Medicare Other | Admitting: *Deleted

## 2019-11-17 DIAGNOSIS — I442 Atrioventricular block, complete: Secondary | ICD-10-CM

## 2019-11-17 LAB — CUP PACEART REMOTE DEVICE CHECK
Battery Remaining Longevity: 124 mo
Battery Voltage: 3.04 V
Brady Statistic AP VP Percent: 8.58 %
Brady Statistic AP VS Percent: 0.1 %
Brady Statistic AS VP Percent: 76.84 %
Brady Statistic AS VS Percent: 14.48 %
Brady Statistic RA Percent Paced: 11.92 %
Brady Statistic RV Percent Paced: 85.42 %
Date Time Interrogation Session: 20210708225240
Implantable Lead Implant Date: 20201005
Implantable Lead Implant Date: 20201005
Implantable Lead Location: 753859
Implantable Lead Location: 753860
Implantable Lead Model: 3830
Implantable Lead Model: 5076
Implantable Pulse Generator Implant Date: 20201005
Lead Channel Impedance Value: 247 Ohm
Lead Channel Impedance Value: 266 Ohm
Lead Channel Impedance Value: 399 Ohm
Lead Channel Impedance Value: 475 Ohm
Lead Channel Pacing Threshold Amplitude: 1 V
Lead Channel Pacing Threshold Amplitude: 1 V
Lead Channel Pacing Threshold Pulse Width: 0.4 ms
Lead Channel Pacing Threshold Pulse Width: 0.4 ms
Lead Channel Sensing Intrinsic Amplitude: 2.5 mV
Lead Channel Sensing Intrinsic Amplitude: 2.5 mV
Lead Channel Sensing Intrinsic Amplitude: 9.75 mV
Lead Channel Sensing Intrinsic Amplitude: 9.75 mV
Lead Channel Setting Pacing Amplitude: 2 V
Lead Channel Setting Pacing Amplitude: 2.5 V
Lead Channel Setting Pacing Pulse Width: 0.4 ms
Lead Channel Setting Sensing Sensitivity: 4 mV

## 2019-11-20 ENCOUNTER — Telehealth: Payer: Self-pay | Admitting: Internal Medicine

## 2019-11-20 NOTE — Telephone Encounter (Signed)
I did not need this encounter, pt had said nurse. She need to talk to a technician in the [facility]. Edwena Blow took the call, when transferred to the [facility].

## 2019-11-21 NOTE — Progress Notes (Signed)
Remote pacemaker transmission.   

## 2019-11-26 ENCOUNTER — Inpatient Hospital Stay (HOSPITAL_COMMUNITY)
Admission: EM | Admit: 2019-11-26 | Discharge: 2019-12-08 | DRG: 222 | Disposition: A | Discharge status: Home / Self Care | Payer: Medicare Other | Attending: Cardiology | Admitting: Cardiology

## 2019-11-26 ENCOUNTER — Other Ambulatory Visit: Payer: Self-pay

## 2019-11-26 ENCOUNTER — Emergency Department (HOSPITAL_COMMUNITY): Payer: Medicare Other

## 2019-11-26 ENCOUNTER — Encounter (HOSPITAL_COMMUNITY): Payer: Self-pay | Admitting: Emergency Medicine

## 2019-11-26 DIAGNOSIS — E1122 Type 2 diabetes mellitus with diabetic chronic kidney disease: Secondary | ICD-10-CM | POA: Diagnosis present

## 2019-11-26 DIAGNOSIS — N179 Acute kidney failure, unspecified: Secondary | ICD-10-CM | POA: Diagnosis present

## 2019-11-26 DIAGNOSIS — I4589 Other specified conduction disorders: Secondary | ICD-10-CM | POA: Diagnosis present

## 2019-11-26 DIAGNOSIS — I130 Hypertensive heart and chronic kidney disease with heart failure and stage 1 through stage 4 chronic kidney disease, or unspecified chronic kidney disease: Principal | ICD-10-CM | POA: Diagnosis present

## 2019-11-26 DIAGNOSIS — Z9889 Other specified postprocedural states: Secondary | ICD-10-CM

## 2019-11-26 DIAGNOSIS — L7632 Postprocedural hematoma of skin and subcutaneous tissue following other procedure: Secondary | ICD-10-CM | POA: Diagnosis not present

## 2019-11-26 DIAGNOSIS — I5023 Acute on chronic systolic (congestive) heart failure: Secondary | ICD-10-CM | POA: Diagnosis present

## 2019-11-26 DIAGNOSIS — R61 Generalized hyperhidrosis: Secondary | ICD-10-CM | POA: Diagnosis present

## 2019-11-26 DIAGNOSIS — E669 Obesity, unspecified: Secondary | ICD-10-CM | POA: Diagnosis present

## 2019-11-26 DIAGNOSIS — Z7982 Long term (current) use of aspirin: Secondary | ICD-10-CM

## 2019-11-26 DIAGNOSIS — Z820 Family history of epilepsy and other diseases of the nervous system: Secondary | ICD-10-CM

## 2019-11-26 DIAGNOSIS — J9601 Acute respiratory failure with hypoxia: Secondary | ICD-10-CM | POA: Diagnosis not present

## 2019-11-26 DIAGNOSIS — E872 Acidosis: Secondary | ICD-10-CM | POA: Diagnosis not present

## 2019-11-26 DIAGNOSIS — E1142 Type 2 diabetes mellitus with diabetic polyneuropathy: Secondary | ICD-10-CM | POA: Diagnosis present

## 2019-11-26 DIAGNOSIS — R630 Anorexia: Secondary | ICD-10-CM | POA: Diagnosis present

## 2019-11-26 DIAGNOSIS — E1165 Type 2 diabetes mellitus with hyperglycemia: Secondary | ICD-10-CM | POA: Diagnosis present

## 2019-11-26 DIAGNOSIS — Z20822 Contact with and (suspected) exposure to covid-19: Secondary | ICD-10-CM | POA: Diagnosis present

## 2019-11-26 DIAGNOSIS — Z885 Allergy status to narcotic agent status: Secondary | ICD-10-CM

## 2019-11-26 DIAGNOSIS — Z780 Asymptomatic menopausal state: Secondary | ICD-10-CM

## 2019-11-26 DIAGNOSIS — Y838 Other surgical procedures as the cause of abnormal reaction of the patient, or of later complication, without mention of misadventure at the time of the procedure: Secondary | ICD-10-CM | POA: Diagnosis not present

## 2019-11-26 DIAGNOSIS — Z79899 Other long term (current) drug therapy: Secondary | ICD-10-CM

## 2019-11-26 DIAGNOSIS — I442 Atrioventricular block, complete: Secondary | ICD-10-CM | POA: Diagnosis not present

## 2019-11-26 DIAGNOSIS — I2721 Secondary pulmonary arterial hypertension: Secondary | ICD-10-CM | POA: Diagnosis present

## 2019-11-26 DIAGNOSIS — I5043 Acute on chronic combined systolic (congestive) and diastolic (congestive) heart failure: Secondary | ICD-10-CM | POA: Diagnosis not present

## 2019-11-26 DIAGNOSIS — I420 Dilated cardiomyopathy: Secondary | ICD-10-CM | POA: Diagnosis present

## 2019-11-26 DIAGNOSIS — Z4659 Encounter for fitting and adjustment of other gastrointestinal appliance and device: Secondary | ICD-10-CM

## 2019-11-26 DIAGNOSIS — E785 Hyperlipidemia, unspecified: Secondary | ICD-10-CM | POA: Diagnosis present

## 2019-11-26 DIAGNOSIS — Z95810 Presence of automatic (implantable) cardiac defibrillator: Secondary | ICD-10-CM

## 2019-11-26 DIAGNOSIS — I400 Infective myocarditis: Secondary | ICD-10-CM | POA: Diagnosis present

## 2019-11-26 DIAGNOSIS — Z7984 Long term (current) use of oral hypoglycemic drugs: Secondary | ICD-10-CM

## 2019-11-26 DIAGNOSIS — J989 Respiratory disorder, unspecified: Secondary | ICD-10-CM

## 2019-11-26 DIAGNOSIS — Z8616 Personal history of COVID-19: Secondary | ICD-10-CM

## 2019-11-26 DIAGNOSIS — Z888 Allergy status to other drugs, medicaments and biological substances status: Secondary | ICD-10-CM | POA: Diagnosis not present

## 2019-11-26 DIAGNOSIS — Z950 Presence of cardiac pacemaker: Secondary | ICD-10-CM | POA: Diagnosis not present

## 2019-11-26 DIAGNOSIS — I480 Paroxysmal atrial fibrillation: Secondary | ICD-10-CM | POA: Diagnosis present

## 2019-11-26 DIAGNOSIS — Z800 Family history of malignant neoplasm of digestive organs: Secondary | ICD-10-CM

## 2019-11-26 DIAGNOSIS — D8685 Sarcoid myocarditis: Secondary | ICD-10-CM | POA: Diagnosis present

## 2019-11-26 DIAGNOSIS — R570 Cardiogenic shock: Secondary | ICD-10-CM | POA: Diagnosis not present

## 2019-11-26 DIAGNOSIS — I083 Combined rheumatic disorders of mitral, aortic and tricuspid valves: Secondary | ICD-10-CM | POA: Diagnosis present

## 2019-11-26 DIAGNOSIS — I428 Other cardiomyopathies: Secondary | ICD-10-CM | POA: Diagnosis not present

## 2019-11-26 DIAGNOSIS — J45909 Unspecified asthma, uncomplicated: Secondary | ICD-10-CM | POA: Diagnosis present

## 2019-11-26 DIAGNOSIS — R0600 Dyspnea, unspecified: Secondary | ICD-10-CM | POA: Diagnosis not present

## 2019-11-26 DIAGNOSIS — T462X5A Adverse effect of other antidysrhythmic drugs, initial encounter: Secondary | ICD-10-CM | POA: Diagnosis not present

## 2019-11-26 DIAGNOSIS — Z882 Allergy status to sulfonamides status: Secondary | ICD-10-CM | POA: Diagnosis not present

## 2019-11-26 DIAGNOSIS — I429 Cardiomyopathy, unspecified: Secondary | ICD-10-CM | POA: Diagnosis not present

## 2019-11-26 DIAGNOSIS — I5021 Acute systolic (congestive) heart failure: Secondary | ICD-10-CM | POA: Diagnosis not present

## 2019-11-26 DIAGNOSIS — B3324 Viral cardiomyopathy: Secondary | ICD-10-CM | POA: Diagnosis present

## 2019-11-26 DIAGNOSIS — I509 Heart failure, unspecified: Secondary | ICD-10-CM

## 2019-11-26 DIAGNOSIS — N184 Chronic kidney disease, stage 4 (severe): Secondary | ICD-10-CM | POA: Diagnosis present

## 2019-11-26 DIAGNOSIS — I472 Ventricular tachycardia: Secondary | ICD-10-CM | POA: Diagnosis present

## 2019-11-26 DIAGNOSIS — Z96652 Presence of left artificial knee joint: Secondary | ICD-10-CM | POA: Diagnosis present

## 2019-11-26 DIAGNOSIS — I5022 Chronic systolic (congestive) heart failure: Secondary | ICD-10-CM | POA: Diagnosis not present

## 2019-11-26 DIAGNOSIS — B9729 Other coronavirus as the cause of diseases classified elsewhere: Secondary | ICD-10-CM | POA: Diagnosis present

## 2019-11-26 DIAGNOSIS — Z6838 Body mass index (BMI) 38.0-38.9, adult: Secondary | ICD-10-CM

## 2019-11-26 DIAGNOSIS — I10 Essential (primary) hypertension: Secondary | ICD-10-CM | POA: Diagnosis present

## 2019-11-26 DIAGNOSIS — E876 Hypokalemia: Secondary | ICD-10-CM | POA: Diagnosis present

## 2019-11-26 DIAGNOSIS — R0602 Shortness of breath: Secondary | ICD-10-CM

## 2019-11-26 DIAGNOSIS — I2510 Atherosclerotic heart disease of native coronary artery without angina pectoris: Secondary | ICD-10-CM | POA: Diagnosis present

## 2019-11-26 DIAGNOSIS — I252 Old myocardial infarction: Secondary | ICD-10-CM

## 2019-11-26 LAB — BASIC METABOLIC PANEL
Anion gap: 11 (ref 5–15)
BUN: 24 mg/dL — ABNORMAL HIGH (ref 6–20)
CO2: 27 mmol/L (ref 22–32)
Calcium: 9 mg/dL (ref 8.9–10.3)
Chloride: 103 mmol/L (ref 98–111)
Creatinine, Ser: 1.37 mg/dL — ABNORMAL HIGH (ref 0.44–1.00)
GFR calc Af Amer: 50 mL/min — ABNORMAL LOW (ref >60)
GFR calc non Af Amer: 43 mL/min — ABNORMAL LOW (ref >60)
Glucose, Bld: 160 mg/dL — ABNORMAL HIGH (ref 70–99)
Potassium: 4.4 mmol/L (ref 3.5–5.1)
Sodium: 141 mmol/L (ref 135–145)

## 2019-11-26 LAB — CBC
HCT: 46.1 % — ABNORMAL HIGH (ref 36.0–46.0)
Hemoglobin: 13.8 g/dL (ref 12.0–15.0)
MCH: 27 pg (ref 26.0–34.0)
MCHC: 29.9 g/dL — ABNORMAL LOW (ref 30.0–36.0)
MCV: 90.2 fL (ref 80.0–100.0)
Platelets: 311 10*3/uL (ref 150–400)
RBC: 5.11 MIL/uL (ref 3.87–5.11)
RDW: 15.6 % — ABNORMAL HIGH (ref 11.5–15.5)
WBC: 8.3 10*3/uL (ref 4.0–10.5)
nRBC: 0 % (ref 0.0–0.2)

## 2019-11-26 LAB — MAGNESIUM: Magnesium: 1.7 mg/dL (ref 1.7–2.4)

## 2019-11-26 LAB — SARS CORONAVIRUS 2 BY RT PCR (HOSPITAL ORDER, PERFORMED IN ~~LOC~~ HOSPITAL LAB): SARS Coronavirus 2: NEGATIVE

## 2019-11-26 LAB — TROPONIN I (HIGH SENSITIVITY)
Troponin I (High Sensitivity): 32 ng/L — ABNORMAL HIGH (ref <18)
Troponin I (High Sensitivity): 52 ng/L — ABNORMAL HIGH (ref <18)

## 2019-11-26 LAB — PHOSPHORUS: Phosphorus: 3.5 mg/dL (ref 2.5–4.6)

## 2019-11-26 LAB — BRAIN NATRIURETIC PEPTIDE: B Natriuretic Peptide: 1258.1 pg/mL — ABNORMAL HIGH (ref 0.0–100.0)

## 2019-11-26 MED ORDER — SODIUM CHLORIDE 0.9% FLUSH: 3.0000 mL | Freq: Once | INTRAVENOUS | Status: DC

## 2019-11-26 MED ORDER — VENLAFAXINE HCL ER 150 MG PO CP24
150.0000 mg | ORAL_CAPSULE | Freq: Every day | ORAL | Status: DC
  Administered 2019-11-28 – 2019-12-01 (×4): 150 mg via ORAL
  Filled 2019-11-26 (×6): qty 1

## 2019-11-26 MED ORDER — IPRATROPIUM-ALBUTEROL 0.5-2.5 (3) MG/3ML IN SOLN
3.0000 mL | Freq: Once | RESPIRATORY_TRACT | Status: AC
  Administered 2019-11-26: 3 mL via RESPIRATORY_TRACT
  Filled 2019-11-26: qty 3

## 2019-11-26 MED ORDER — ENOXAPARIN SODIUM 40 MG/0.4ML ~~LOC~~ SOLN
40.0000 mg | Freq: Every day | SUBCUTANEOUS | Status: DC
  Administered 2019-11-26 – 2019-11-30 (×5): 40 mg via SUBCUTANEOUS
  Filled 2019-11-26 (×5): qty 0.4

## 2019-11-26 MED ORDER — SODIUM CHLORIDE 0.9% FLUSH: 3.0000 mL | INTRAVENOUS | Status: DC | PRN

## 2019-11-26 MED ORDER — HYDROCODONE-ACETAMINOPHEN 10-325 MG PO TABS
1.0000 | ORAL_TABLET | Freq: Four times a day (QID) | ORAL | Status: DC
  Administered 2019-11-27 – 2019-12-01 (×18): 1 via ORAL
  Filled 2019-11-26 (×19): qty 1

## 2019-11-26 MED ORDER — FUROSEMIDE 10 MG/ML IJ SOLN
40.0000 mg | Freq: Once | INTRAMUSCULAR | Status: AC
  Administered 2019-11-26: 40 mg via INTRAVENOUS
  Filled 2019-11-26: qty 4

## 2019-11-26 MED ORDER — ASPIRIN EC 81 MG PO TBEC
81.0000 mg | DELAYED_RELEASE_TABLET | Freq: Every day | ORAL | Status: DC
  Administered 2019-11-27 – 2019-12-01 (×5): 81 mg via ORAL
  Filled 2019-11-26 (×6): qty 1

## 2019-11-26 MED ORDER — SODIUM CHLORIDE 0.9 % IV SOLN: 250.0000 mL | INTRAVENOUS | Status: DC | PRN

## 2019-11-26 MED ORDER — ACETAMINOPHEN 325 MG PO TABS
650.0000 mg | ORAL_TABLET | ORAL | Status: DC | PRN
  Administered 2019-12-03 – 2019-12-06 (×5): 650 mg via ORAL
  Filled 2019-11-26 (×5): qty 2

## 2019-11-26 MED ORDER — SODIUM CHLORIDE 0.9% FLUSH
3.0000 mL | Freq: Two times a day (BID) | INTRAVENOUS | Status: DC
  Administered 2019-11-27 – 2019-11-30 (×6): 3 mL via INTRAVENOUS

## 2019-11-26 MED ORDER — IPRATROPIUM-ALBUTEROL 0.5-2.5 (3) MG/3ML IN SOLN: 3.0000 mL | Freq: Four times a day (QID) | RESPIRATORY_TRACT | Status: DC | PRN

## 2019-11-26 NOTE — H&P (Signed)
Date: 11/27/2019               Patient Name:  Lisa Poole MRN: [id number]  DOB: [birth year] Age / Sex: 54 y.o., female   PCP: [place] Cage, PA         Medical Service: Internal Medicine Teaching Service         Attending Physician: Dr. Sid Falcon, MD    First Contact: Dr. Carroll Sage Pager: [phone]  Second Contact: Dr. Nathanial Rancher Pager: [phone]       After Hours (After 5p/  First Contact Pager: [phone]  weekends / holidays): Second Contact Pager: [phone]   Chief Complaint: Shortness of breath  History of Present Illness:  Lisa Poole is a 54 year old female with past medical history significant for NSTEMI, complete heart block s/p pacemaker, DM, HTN, and HLD who presented to [facility] on 11/26/19 with shortness of breath.  Per patient, she states for the past couple weeks she has had on and off worsening shortness of breath. She reports that her shortness of breath worsened with laying down flat and improved with sitting up. Over the past three days, she began to develop swelling in her bilateral lower extremities which she reports has never happened in the past. She checked her weight and noticed that she had an approximate 12 pound weight gain since last week. She has not been eating or drinking as much during this time due to a lack of appetite and abdominal fullness. This morning, she developed chest tightness and nonproductive cough so she desired to come in for evaluation. She denies history of heart failure. Otherwise she denies fever, chills, congestion, rhinorrhea, chest pain, nausea/vomiting/diarrhea, leg pain.   On arrival to the ED, she was afebrile, hypertensive, tachycardic, tachypneic and satting at 94% on room air. CBC unremarkable; BMP notable for Cr 1.37; BNP 1,258; troponin 32. She received 23m lasix and was started on BiPAP. She was admitted to the Internal Medicine Teaching Service for further evaluation and  treatment.  Meds: No current facility-administered medications on file prior to encounter.   Current Outpatient Medications on File Prior to Encounter  Medication Sig Dispense Refill  . aspirin EC 81 MG tablet Take 81 mg by mouth daily. Swallow whole.    . Biotin 10 MG CAPS Take 10 mg by mouth daily.    . cetirizine (ZYRTEC) 10 MG tablet Take 10 mg by mouth daily.    . Cholecalciferol (VITAMIN D-3) 25 MCG (1000 UT) CAPS Take 1,000 Units by mouth daily.     .Marland Kitchengabapentin (NEURONTIN) 600 MG tablet Take 600 mg by mouth 2 (two) times daily.    . hydrochlorothiazide (HYDRODIURIL) 25 MG tablet Take 25 mg by mouth daily.    .Marland KitchenHYDROcodone-acetaminophen (NORCO) 10-325 MG tablet Take 1 tablet by mouth 4 (four) times daily.     .Marland Kitchenlosartan (COZAAR) 25 MG tablet Take 0.5 tablets (12.5 mg total) by mouth daily. 30 tablet 1  . lovastatin (MEVACOR) 10 MG tablet Take 10 mg by mouth at bedtime.     . meloxicam (MOBIC) 15 MG tablet Take 15 mg by mouth daily.    . metFORMIN (GLUCOPHAGE) 500 MG tablet Take 500 mg by mouth 2 (two) times daily.    . metoprolol tartrate (LOPRESSOR) 25 MG tablet TAKE 1 TABLET BY MOUTH TWICE DAILY (Patient taking differently: Take 25 mg by mouth 2 (two) times daily. ) 60 tablet 8  . Multiple Vitamins-Minerals (  CENTRUM SILVER 50+WOMEN) TABS Take 1 tablet by mouth daily.    . naloxone (NARCAN) 4 MG/0.1ML LIQD nasal spray kit Place 1 spray into the nose as directed.    Marland Kitchen omeprazole (PRILOSEC) 40 MG capsule Take 40 mg by mouth daily.    . ondansetron (ZOFRAN ODT) 4 MG disintegrating tablet Take 1 tablet (4 mg total) by mouth every 8 (eight) hours as needed for nausea or vomiting. 20 tablet 0  . venlafaxine XR (EFFEXOR-XR) 150 MG 24 hr capsule Take 150 mg by mouth daily.    Marland Kitchen acetaminophen (TYLENOL) 325 MG tablet Take 1-2 tablets (325-650 mg total) by mouth every 4 (four) hours as needed for mild pain. (Patient not taking: Reported on 11/26/2019)    . albuterol (PROVENTIL HFA;VENTOLIN HFA)  108 (90 Base) MCG/ACT inhaler Inhale 1-2 puffs into the lungs every 6 (six) hours as needed for wheezing or shortness of breath. 1 Inhaler 0   Allergies: Allergies as of 11/26/2019 - Review Complete 11/26/2019  Allergen Reaction Noted  . Lisinopril Other (See Comments) and Cough 10/10/2012  . Ace inhibitors Other (See Comments) 12/30/2015  . Augmentin [amoxicillin-pot clavulanate] Nausea And Vomiting 12/12/2018  . Benazepril Other (See Comments)   . Lotensin [benazepril hcl] Other (See Comments) 05/10/2014  . Oxycodone-acetaminophen Nausea And Vomiting 02/10/2019  . Sulfa antibiotics Nausea And Vomiting 12/30/2015   Past Medical History:  Diagnosis Date  . Aortic insufficiency   . Asthma 09/13/2006  . Complete heart block ([place]) 02/11/2019  . Cystocele without uterine prolapse 12/12/2018  . Diabetes mellitus without complication ([place])   . Elevated troponin level   . Hypercholesteremia   . Hypertension   . Primary osteoarthritis of left knee 12/10/2017   Family History: Family History  Problem Relation Age of Onset  . Colon cancer Maternal Grandmother   . Alzheimer's disease Mother    Denies family history of CAD, CHF or arrhythmia.  Social History:  -Lives at home with husband -No sick contacts -Denies tobacco, alcohol, illicit drug use  Review of Systems: A complete ROS was negative except as per HPI.  Physical Exam: Blood pressure (!) 135/110, pulse (!) 119, temperature 98.4 F (36.9 C), temperature source Oral, resp. rate 14, height 5' (1.524 m), weight 83.9 kg, last menstrual period 07/25/2015, SpO2 97 %. Physical Exam Constitutional:      Appearance: She is obese. She is ill-appearing.  HENT:     Head: Normocephalic and atraumatic.  Eyes:     Extraocular Movements: Extraocular movements intact.     Pupils: Pupils are equal, round, and reactive to light.  Neck:     Vascular: JVD present.  Cardiovascular:     Rate and Rhythm: Regular rhythm. Tachycardia present.      Pulses: Normal pulses.     Heart sounds: Normal heart sounds. No murmur heard.  No gallop.   Pulmonary:     Breath sounds: Examination of the right-upper field reveals rhonchi. Examination of the left-upper field reveals rhonchi. Examination of the right-middle field reveals rhonchi. Examination of the left-middle field reveals rhonchi. Examination of the right-lower field reveals rhonchi. Examination of the left-lower field reveals rhonchi. Rhonchi present.  Abdominal:     General: Bowel sounds are normal.     Palpations: Abdomen is soft.     Comments: Distended.  Musculoskeletal:        General: Normal range of motion.     Cervical back: Normal range of motion and neck supple.     Right lower leg:  Edema present.     Left lower leg: Edema present.     Comments: 2+ pitting edema to above the knee bilaterally  Skin:    General: Skin is warm and dry.  Neurological:     General: No focal deficit present.     Mental Status: She is oriented to person, place, and time.  Psychiatric:        Mood and Affect: Mood is anxious.        Behavior: Behavior normal.    Labs: BMP: Glu 160, BUN 24, Cr 1.37 CBC: unremarkable BNP: 1,258.1 Mg: 1.7 Phos: 3.5 Troponin: 32 COVID: Negative  EKG: Atrial-sensed ventricular-paced rhythm with prolonged AV conduction. Rate increased compared to prior. CXR: Findings concerning for developing pulmonary edema versus an atypical infectious process in the appropriate clinical setting.  Assessment & Plan by Problem: Lisa Poole is a 54 year old female with past medical history significant for NSTEMI, complete heart block s/p pacemaker, DM, HTN, and HLD who presented to [facility] Emergency Department on 11/26/19 with shortness of breath found to have acute decompensated heart failure. Active Problems:   Acute decompensated heart failure (HCC)  #Acute decompensated heart failure #Hypoxia Patient with subacute shortness of breath, bilateral lower  extremity pitting edema, abdominal fullness, nonproductive cough, and hypoxia. She is volume overloaded on physical exam. BNP elevated. CXR reveals pulmonary edema. Previous echocardiogram one year prior showed normal ejection fraction with mild concentric hypertrophy, but no systolic or diastolic dysfunction. PLAN: -Echocardiogram -Will likely need ischemic workup, consider consulting cardiology in the morning -Lasix 77m IV -Daily weights -Strict I's and O's. -Holding HCTZ -Holding metoprolol -Holding losartan -[place] O2  #Troponinemia Troponin of 25 on arrival. Repeat troponin 52. No ST elevation or depression on EKG, likely due to demand ischemia.  PLAN: -Trend troponins  #Acute kidney injury Baseline Cr of 0.8. Today Cr of 1.37. Likely secondary to acute decompensated heart failure. PLAN: -Holding home losartan -BMP in AM  #HTN PLAN: -Holding home losartan in setting of AKI -Holding home metoprolol  #Diabetes mellitus Last A1c - 6.2 (09/27/2018) PLAN: -HbA1c -SSI -Holding home metformin  #Hyperlipidemia PLAN: -ravastatin 189mdaily  #Neuropathy PLAN: -Continue home gabapentin -Continue home Norco  Code status: Full code Diet: 2g sodium DVT PPx: Lovenox 4083mubcutaneous  Dispo: Admit patient to Inpatient with expected length of stay greater than 2 midnights.  Signed: JohPaulla DollyD 11/27/2019, 12:30 AM  Pager: [phone] 5pm on weekdays and 1pm on weekends: On Call pager: [phone]

## 2019-11-26 NOTE — ED Provider Notes (Signed)
[facility] EMERGENCY DEPARTMENT Provider Note   CSN: [id number] Arrival date & time: 11/26/19  1846     History Chief Complaint  Patient presents with  . Shortness of Breath  . Cough    Lisa Poole is a 54 y.o. female w PMHx significant for DM, HTN, HLD who presents with SOB.  Patient states shortness of breath slowly worsening over the past 3 to 4 days with acute worsening today since awakening at 430 this morning.  Patient endorses cough without fever, congestion, rhinorrhea.  Patient denies prior CHF history, endorses taking HCTZ does not take Lasix.  Patient endorses worsening swelling to bilateral lower extremities over past 2 to 3 days with 12 pound weight gain in the last week.  Patient denies chest pain.  Patient states she has pacemaker for "bottom of her heart does not work correctly", chart review shows history of complete heart block.  HPI     Past Medical History:  Diagnosis Date  . Aortic insufficiency   . Asthma 09/13/2006  . Complete heart block ([place]) 02/11/2019  . Cystocele without uterine prolapse 12/12/2018  . Diabetes mellitus without complication ([place])   . Elevated troponin level   . Hypercholesteremia   . Hypertension   . Primary osteoarthritis of left knee 12/10/2017    Patient Active Problem List   Diagnosis Date Noted  . Acute decompensated heart failure ([place]) 11/26/2019  . Pacemaker 05/18/2019  . Uterovaginal prolapse, incomplete 02/15/2019  . Complete heart block ([place]) 02/11/2019  . Cystocele without uterine prolapse 12/12/2018  . Elevated troponin level   . Chest discomfort   . NSTEMI (non-ST elevated myocardial infarction) ([place]) 09/27/2018  . Primary osteoarthritis of left knee 12/10/2017  . S/P knee replacement 12/10/2017  . Pain in left knee 10/20/2017  . Tear of right rotator cuff 05/20/2017  . Acromioclavicular arthrosis 08/01/2013  . Aortic insufficiency   . Dry eye syndrome 11/25/2007  . Asthma 09/13/2006  .  Diabetes mellitus ([place]) 09/13/2006  . HTN (hypertension) 09/13/2006    Past Surgical History:  Procedure Laterality Date  . APPENDECTOMY    . LEFT HEART CATH AND CORONARY ANGIOGRAPHY N/A 09/28/2018   Procedure: LEFT HEART CATH AND CORONARY ANGIOGRAPHY;  Surgeon: Troy Sine, MD;  Location: [facility];  Service: Cardiovascular;  Laterality: N/A;  . PACEMAKER IMPLANT N/A 02/13/2019   Procedure: PACEMAKER IMPLANT;  Surgeon: Evans Lance, MD;  Location: [facility];  Service: Cardiovascular;  Laterality: N/A;  . PACEMAKER IMPLANT    . ROTATOR CUFF REPAIR Bilateral 2017  . SINUS EXPLORATION    . TOTAL KNEE ARTHROPLASTY Left 12/10/2017   Procedure: LEFT TOTAL KNEE ARTHROPLASTY;  Surgeon: Sydnee Cabal, MD;  Location: [facility];  Service: Orthopedics;  Laterality: Left;  Adductor Block  . TUBAL LIGATION       OB History    Gravida  5   Para  4   Term  1   Preterm  3   AB  1   Living  4     SAB  1   TAB  0   Ectopic  0   Multiple  0   Live Births  4           Family History  Problem Relation Age of Onset  . Colon cancer Maternal Grandmother   . Alzheimer's disease Mother     Social History   Tobacco Use  . Smoking status: Never Smoker  . Smokeless  tobacco: Never Used  Vaping Use  . Vaping Use: Never used  Substance Use Topics  . Alcohol use: No  . Drug use: No    Home Medications Prior to Admission medications   Medication Sig Start Date End Date Taking? Authorizing Provider  aspirin EC 81 MG tablet Take 81 mg by mouth daily. Swallow whole.   Yes [provider]  Biotin 10 MG CAPS Take 10 mg by mouth daily.   Yes [provider]  cetirizine (ZYRTEC) 10 MG tablet Take 10 mg by mouth daily.   Yes [provider]  Cholecalciferol (VITAMIN D-3) 25 MCG (1000 UT) CAPS Take 1,000 Units by mouth daily.    Yes [provider]  gabapentin (NEURONTIN) 600 MG tablet Take 600 mg by mouth 2 (two) times daily.    Yes [provider]  hydrochlorothiazide (HYDRODIURIL) 25 MG tablet Take 25 mg by mouth daily. 09/25/19  Yes [provider]  HYDROcodone-acetaminophen (NORCO) 10-325 MG tablet Take 1 tablet by mouth 4 (four) times daily.    Yes [provider]  losartan (COZAAR) 25 MG tablet Take 0.5 tablets (12.5 mg total) by mouth daily. 09/29/18  Yes Reino Bellis B, NP  lovastatin (MEVACOR) 10 MG tablet Take 10 mg by mouth at bedtime.    Yes [provider]  meloxicam (MOBIC) 15 MG tablet Take 15 mg by mouth daily. 02/05/19  Yes [provider]  metFORMIN (GLUCOPHAGE) 500 MG tablet Take 500 mg by mouth 2 (two) times daily. 07/26/19  Yes [provider]  metoprolol tartrate (LOPRESSOR) 25 MG tablet TAKE 1 TABLET BY MOUTH TWICE DAILY Patient taking differently: Take 25 mg by mouth 2 (two) times daily.  07/17/19  Yes Shirley Friar, PA-C  Multiple Vitamins-Minerals (CENTRUM SILVER 50+WOMEN) TABS Take 1 tablet by mouth daily.   Yes [provider]  naloxone (NARCAN) 4 MG/0.1ML LIQD nasal spray kit Place 1 spray into the nose as directed.   Yes [provider]  omeprazole (PRILOSEC) 40 MG capsule Take 40 mg by mouth daily.   Yes [provider]  ondansetron (ZOFRAN ODT) 4 MG disintegrating tablet Take 1 tablet (4 mg total) by mouth every 8 (eight) hours as needed for nausea or vomiting. 10/20/19  Yes Lucrezia Starch, MD  venlafaxine XR (EFFEXOR-XR) 150 MG 24 hr capsule Take 150 mg by mouth daily. 05/07/19  Yes [provider]  acetaminophen (TYLENOL) 325 MG tablet Take 1-2 tablets (325-650 mg total) by mouth every 4 (four) hours as needed for mild pain. Patient not taking: Reported on 11/26/2019 02/14/19   Shirley Friar, PA-C  albuterol (PROVENTIL HFA;VENTOLIN HFA) 108 [phone] Base) MCG/ACT inhaler Inhale 1-2 puffs into the lungs every 6 (six) hours as needed for wheezing or shortness of breath. 07/30/18   Drenda Freeze, MD    Allergies    Lisinopril, Ace inhibitors, Augmentin [amoxicillin-pot clavulanate], Benazepril, Lotensin [benazepril hcl], Oxycodone-acetaminophen, and Sulfa antibiotics  Review of Systems   Review of Systems  Constitutional: Positive for diaphoresis and unexpected weight change. Negative for chills and fever.       Pt w 12lb weight gain in last wk.    HENT: Negative for congestion, rhinorrhea and sore throat.   Eyes: Negative for redness and visual disturbance.  Respiratory: Positive for cough, shortness of breath and wheezing.   Cardiovascular: Positive for leg swelling. Negative for chest pain.       Worsening b/l LEE over last 2-3 days  Gastrointestinal:  Negative for abdominal pain, nausea and vomiting.  Genitourinary: Negative for dysuria and hematuria.  Musculoskeletal: Positive for back pain and neck pain.       Endorses R sided back pain  Skin: Negative for color change, rash and wound.  Neurological: Positive for headaches. Negative for dizziness.  Psychiatric/Behavioral: Negative.     Physical Exam Updated Vital Signs BP (!) 135/110   Pulse (!) 119   Temp 98.4 F (36.9 C) (Oral)   Resp 14   Ht 5' (1.524 m)   Wt 83.9 kg   LMP 07/25/2015 (Exact Date)   SpO2 100%   BMI 36.13 kg/m   Physical Exam Vitals and nursing note reviewed.  Constitutional:      General: She is in acute distress.     Appearance: She is obese. She is ill-appearing. She is not toxic-appearing or diaphoretic.  HENT:     Head: Normocephalic and atraumatic.  Eyes:     Extraocular Movements: Extraocular movements intact.     Pupils: Pupils are equal, round, and reactive to light.  Cardiovascular:     Rate and Rhythm: Regular rhythm. Tachycardia present.     Heart sounds: No murmur heard.   Pulmonary:     Effort: Tachypnea present.     Breath sounds: Examination of the right-upper field reveals wheezing. Examination of the left-upper field reveals wheezing. Examination of the  right-middle field reveals wheezing and rhonchi. Examination of the left-middle field reveals wheezing and rhonchi. Examination of the right-lower field reveals rhonchi. Examination of the left-lower field reveals rhonchi. Wheezing and rhonchi present.  Chest:     Chest wall: No tenderness.  Abdominal:     Palpations: Abdomen is soft.     Tenderness: There is no abdominal tenderness. There is no guarding or rebound.  Musculoskeletal:     Right lower leg: Edema present.     Left lower leg: Edema present.  Skin:    General: Skin is warm.     Findings: No rash.  Neurological:     General: No focal deficit present.     Mental Status: She is alert and oriented to person, place, and time.  Psychiatric:        Mood and Affect: Mood normal.        Behavior: Behavior normal.     ED Results / Procedures / Treatments   Labs (all labs ordered are listed, but only abnormal results are displayed) Labs Reviewed  BASIC METABOLIC PANEL - Abnormal; Notable for the following components:      Result Value   Glucose, Bld 160 (*)    BUN 24 (*)    Creatinine, Ser 1.37 (*)    GFR calc non Af Amer 43 (*)    GFR calc Af Amer 50 (*)    All other components within normal limits  CBC - Abnormal; Notable for the following components:   HCT 46.1 (*)    MCHC 29.9 (*)    RDW 15.6 (*)    All other components within normal limits  BRAIN NATRIURETIC PEPTIDE - Abnormal; Notable for the following components:   B Natriuretic Peptide 1,258.1 (*)    All other components within normal limits  TROPONIN I (HIGH SENSITIVITY) - Abnormal; Notable for the following components:   Troponin I (High Sensitivity) 32 (*)    All other components within normal limits  TROPONIN I (HIGH SENSITIVITY) - Abnormal; Notable for the following components:   Troponin I (High Sensitivity) 52 (*)    All  other components within normal limits  SARS CORONAVIRUS 2 BY RT PCR (HOSPITAL ORDER, [facility])  MAGNESIUM   PHOSPHORUS  HIV ANTIBODY (ROUTINE TESTING W REFLEX)  BASIC METABOLIC PANEL    EKG EKG Interpretation  Date/Time:  Sunday November 26 2019 18:51:07 EDT Ventricular Rate:  129 PR Interval:    QRS Duration: 98 QT Interval:  360 QTC Calculation: 527 R Axis:   134 Text Interpretation: Atrial-sensed ventricular-paced rhythm with prolonged AV conduction Abnormal ECG agree, rate increased compared to previous. Confirmed by Charlesetta Shanks [phone]) on 11/26/2019 8:02:53 PM   Radiology DG Chest Portable 1 View  Result Date: 11/26/2019 CLINICAL DATA:  Shortness of breath. EXAM: PORTABLE CHEST 1 VIEW COMPARISON:  08/16/2019 FINDINGS: There are prominent interstitial lung markings bilaterally. There is scattered hazy airspace opacities. The heart size is enlarged. Aortic calcifications are noted. There is a dual chamber left-sided pacemaker in place. There may be small bilateral pleural effusions. There is no pneumothorax. No acute osseous abnormality. Calcified lymph nodes are noted. IMPRESSION: Findings concerning for developing pulmonary edema versus an atypical infectious process in the appropriate clinical setting. Electronically Signed   By: Constance Holster M.D.   On: 11/26/2019 20:15    Procedures Procedures (including critical care time)  Medications Ordered in ED Medications  sodium chloride flush (NS) 0.9 % injection 3 mL (3 mLs Intravenous Not Given 11/26/19 1937)  sodium chloride flush (NS) 0.9 % injection 3 mL (has no administration in time range)  sodium chloride flush (NS) 0.9 % injection 3 mL (has no administration in time range)  0.9 %  sodium chloride infusion (has no administration in time range)  acetaminophen (TYLENOL) tablet 650 mg (has no administration in time range)  enoxaparin (LOVENOX) injection 40 mg (has no administration in time range)  furosemide (LASIX) injection 40 mg (40 mg Intravenous Given 11/26/19 2020)  ipratropium-albuterol (DUONEB) 0.5-2.5 (3) MG/3ML  nebulizer solution 3 mL (3 mLs Nebulization Given 11/26/19 2023)  furosemide (LASIX) injection 40 mg (40 mg Intravenous Given 11/26/19 2232)    ED Course  I have reviewed the triage vital signs and the nursing notes.  Pertinent labs & imaging results that were available during my care of the patient were reviewed by me and considered in my medical decision making (see chart for details).    MDM Rules/Calculators/A&P                          Medical Decision Making: SAMMANTHA MEHLHAFF is a 54 y.o. female who presented to the ED today with SOB.  Patient with acute worsening of shortness of breath at rest and with exercise since early this morning.  Patient states slow gradual worsening shortness of breath over the past 3 to 4 days.  Worsening lower extremity edema bilaterally for past 2 to 3 days, weight gain of approximately 12 pounds over the past week.  No past medical history of underlying lung disease including COPD, asthma and no history of CHF. On exam pt afebrile, however tachycardic to HR 100s, Tachypneic with new O2 requirement of 2L [place]. (Pt with SpO2 in triage 89% on rm air).  On exam patient with tachypnea, obvious increased work of breathing.  Patient with crackles to bibasilar lung bases, wheezing throughout.  No cardiac murmur auscultated.  Patient with 2+ pitting edema bilaterally to lower extremities..   In the setting of recent weight gain, worsening lower extremity weakness, crackles b/l on exam  w CXR significant and prominent interstitial lung markings, concern for new CHF diagnosis with exacerbation. In setting of wheezing on exam, obstructive lung disease exacerbation considered however no history of obstructive lung disease, presentation more consistent with volume overload/CHF.  Due to work patient breathing, BiPAP initiated.  Will give duo nebs as well for wheezing. Lasix 62m IV given. ACS considered however no acute ischemic changes on EKG, no chest pain verbalized in history that  sounds typical for ACS. Infectious pneumonia secondary to bacterial versus viral URI could be etiology of symptoms, however patient afebrile, wo leukocytosis, CXR findings more consistent with volume overload in clinical setting. EKG was reviewed and significant for Atrial-sensed ventricular-paced rhythm with prolonged AV conduction. Due to marked tachycardia with Pacer spikes cardiology was consulted for EKG review. Cardiology reviewed EKG and patient medical record and determined that EKG consistent withy sinus tachycardia with AV delay and did not show concerning features for pacemaker dysfunction. Confirmed pacemaker originally placed for heart block. Labs support dx of CHF w exacerbation with BNP 1,258. BMP significant for slight elevation in Cr from prior to 1.37 (1.10 1 mo prior). Initial troponin mildly elevated to 32, in setting of presentation do not believe elevation consistent with ACS. Due to new CHF diagnosis with clinical volume overload requiring Bipap will admit for continued work up and management. Pt admitted to Internal Medicine Service.    Final Clinical Impression(s) / ED Diagnoses Final diagnoses:  Acute on chronic congestive heart failure, unspecified heart failure type ([place]  Shortness of breath    Rx / DC Orders ED Discharge Orders    None       PKennyth Lose MD 11/26/19 [id number]   PCharlesetta Shanks MD 11/28/19 1102

## 2019-11-26 NOTE — ED Triage Notes (Signed)
Pt to triage via [facility] from home.  Reports intermittent SOB since this morning with non-productive cough, headache, and body aches.  Denies fever.  Negative pre-procedural COVID test last week.  Lung sounds clear.  90-93% on room air.  96-98% on 4 liters [place].  18g LAC.

## 2019-11-26 NOTE — ED Notes (Signed)
Pt RA O2 sat 89-90%. Pt placed on 4L via [place] and SpO2 now 94%.

## 2019-11-27 ENCOUNTER — Inpatient Hospital Stay (HOSPITAL_COMMUNITY): Payer: Medicare Other

## 2019-11-27 DIAGNOSIS — I5022 Chronic systolic (congestive) heart failure: Secondary | ICD-10-CM

## 2019-11-27 DIAGNOSIS — R0602 Shortness of breath: Secondary | ICD-10-CM | POA: Insufficient documentation

## 2019-11-27 DIAGNOSIS — I472 Ventricular tachycardia: Secondary | ICD-10-CM

## 2019-11-27 DIAGNOSIS — I509 Heart failure, unspecified: Secondary | ICD-10-CM | POA: Diagnosis not present

## 2019-11-27 DIAGNOSIS — I429 Cardiomyopathy, unspecified: Secondary | ICD-10-CM

## 2019-11-27 LAB — BASIC METABOLIC PANEL
Anion gap: 11 (ref 5–15)
Anion gap: 14 (ref 5–15)
BUN: 22 mg/dL — ABNORMAL HIGH (ref 6–20)
BUN: 24 mg/dL — ABNORMAL HIGH (ref 6–20)
CO2: 29 mmol/L (ref 22–32)
CO2: 27 mmol/L (ref 22–32)
Calcium: 8.8 mg/dL — ABNORMAL LOW (ref 8.9–10.3)
Calcium: 8.8 mg/dL — ABNORMAL LOW (ref 8.9–10.3)
Chloride: 101 mmol/L (ref 98–111)
Chloride: 98 mmol/L (ref 98–111)
Creatinine, Ser: 1.18 mg/dL — ABNORMAL HIGH (ref 0.44–1.00)
Creatinine, Ser: 1.85 mg/dL — ABNORMAL HIGH (ref 0.44–1.00)
GFR calc Af Amer: >60 mL/min (ref >60)
GFR calc Af Amer: 35 mL/min — ABNORMAL LOW (ref >60)
GFR calc non Af Amer: 52 mL/min — ABNORMAL LOW (ref >60)
GFR calc non Af Amer: 30 mL/min — ABNORMAL LOW (ref >60)
Glucose, Bld: 130 mg/dL — ABNORMAL HIGH (ref 70–99)
Glucose, Bld: 134 mg/dL — ABNORMAL HIGH (ref 70–99)
Potassium: 3.2 mmol/L — ABNORMAL LOW (ref 3.5–5.1)
Potassium: 4 mmol/L (ref 3.5–5.1)
Sodium: 141 mmol/L (ref 135–145)
Sodium: 139 mmol/L (ref 135–145)

## 2019-11-27 LAB — TROPONIN I (HIGH SENSITIVITY): Troponin I (High Sensitivity): 67 ng/L — ABNORMAL HIGH (ref <18)

## 2019-11-27 LAB — URINALYSIS, ROUTINE W REFLEX MICROSCOPIC
Bilirubin Urine: NEGATIVE
Glucose, UA: NEGATIVE mg/dL
Hgb urine dipstick: NEGATIVE
Ketones, ur: NEGATIVE mg/dL
Leukocytes,Ua: NEGATIVE
Nitrite: NEGATIVE
Protein, ur: NEGATIVE mg/dL
Specific Gravity, Urine: 1.006 (ref 1.005–1.030)
pH: 6 (ref 5.0–8.0)

## 2019-11-27 LAB — HIV ANTIBODY (ROUTINE TESTING W REFLEX): HIV Screen 4th Generation wRfx: NONREACTIVE

## 2019-11-27 LAB — ECHOCARDIOGRAM COMPLETE
Height: 60 in
S' Lateral: 5 cm
Weight: 2960 oz

## 2019-11-27 LAB — MAGNESIUM: Magnesium: 2.3 mg/dL (ref 1.7–2.4)

## 2019-11-27 LAB — HEMOGLOBIN A1C
Hgb A1c MFr Bld: 7.1 % — ABNORMAL HIGH (ref 4.8–5.6)
Mean Plasma Glucose: 157.07 mg/dL

## 2019-11-27 LAB — PHOSPHORUS: Phosphorus: 3.9 mg/dL (ref 2.5–4.6)

## 2019-11-27 LAB — CBG MONITORING, ED
Glucose-Capillary: 142 mg/dL — ABNORMAL HIGH (ref 70–99)
Glucose-Capillary: 256 mg/dL — ABNORMAL HIGH (ref 70–99)
Glucose-Capillary: 177 mg/dL — ABNORMAL HIGH (ref 70–99)

## 2019-11-27 MED ORDER — SODIUM CHLORIDE 0.9% FLUSH: 10.0000 mL | INTRAVENOUS | Status: DC | PRN

## 2019-11-27 MED ORDER — FUROSEMIDE 10 MG/ML IJ SOLN
8.0000 mg/h | INTRAVENOUS | Status: DC
  Filled 2019-11-27: qty 25

## 2019-11-27 MED ORDER — PERFLUTREN LIPID MICROSPHERE
1.0000 mL | INTRAVENOUS | Status: AC | PRN
  Administered 2019-11-27: 4 mL via INTRAVENOUS
  Filled 2019-11-27: qty 10

## 2019-11-27 MED ORDER — SODIUM CHLORIDE 0.9 % IV SOLN
INTRAVENOUS | Status: DC | PRN
  Administered 2019-12-02 – 2019-12-04 (×2): 10 mL/h via INTRA_ARTERIAL

## 2019-11-27 MED ORDER — MAGNESIUM SULFATE 2 GM/50ML IV SOLN
2.0000 g | Freq: Once | INTRAVENOUS | Status: AC
  Administered 2019-11-27: 2 g via INTRAVENOUS
  Filled 2019-11-27: qty 50

## 2019-11-27 MED ORDER — FUROSEMIDE 10 MG/ML IJ SOLN
80.0000 mg | Freq: Once | INTRAMUSCULAR | Status: DC
  Filled 2019-11-27: qty 8

## 2019-11-27 MED ORDER — INSULIN ASPART 100 UNIT/ML ~~LOC~~ SOLN
0.0000 [IU] | Freq: Three times a day (TID) | SUBCUTANEOUS | Status: DC
  Administered 2019-11-27: 2 [IU] via SUBCUTANEOUS
  Administered 2019-11-27: 8 [IU] via SUBCUTANEOUS
  Administered 2019-11-27: 3 [IU] via SUBCUTANEOUS
  Administered 2019-11-28: 2 [IU] via SUBCUTANEOUS
  Administered 2019-11-28: 3 [IU] via SUBCUTANEOUS
  Administered 2019-11-28: 8 [IU] via SUBCUTANEOUS
  Administered 2019-11-29: 3 [IU] via SUBCUTANEOUS
  Administered 2019-11-29 (×2): 2 [IU] via SUBCUTANEOUS
  Administered 2019-11-30 – 2019-12-01 (×4): 3 [IU] via SUBCUTANEOUS
  Administered 2019-12-01: 2 [IU] via SUBCUTANEOUS
  Administered 2019-12-01: 3 [IU] via SUBCUTANEOUS
  Administered 2019-12-02: 5 [IU] via SUBCUTANEOUS
  Administered 2019-12-02: 2 [IU] via SUBCUTANEOUS

## 2019-11-27 MED ORDER — POTASSIUM CHLORIDE 20 MEQ PO PACK
40.0000 meq | PACK | Freq: Once | ORAL | Status: AC
  Administered 2019-11-27: 40 meq via ORAL
  Filled 2019-11-27: qty 2

## 2019-11-27 MED ORDER — METOPROLOL TARTRATE 5 MG/5ML IV SOLN: 5.0000 mg | Freq: Once | INTRAVENOUS | Status: DC

## 2019-11-27 MED ORDER — POTASSIUM CHLORIDE 10 MEQ/100ML IV SOLN
10.0000 meq | INTRAVENOUS | Status: AC
  Administered 2019-11-27 (×4): 10 meq via INTRAVENOUS
  Filled 2019-11-27 (×3): qty 100

## 2019-11-27 MED ORDER — METOPROLOL TARTRATE 25 MG PO TABS
25.0000 mg | ORAL_TABLET | Freq: Two times a day (BID) | ORAL | Status: DC
  Filled 2019-11-27: qty 1

## 2019-11-27 MED ORDER — PRAVASTATIN SODIUM 10 MG PO TABS
10.0000 mg | ORAL_TABLET | Freq: Every day | ORAL | Status: DC
  Administered 2019-11-27 – 2019-12-01 (×5): 10 mg via ORAL
  Filled 2019-11-27 (×5): qty 1

## 2019-11-27 MED ORDER — AMIODARONE HCL IN DEXTROSE 360-4.14 MG/200ML-% IV SOLN
60.0000 mg/h | INTRAVENOUS | Status: DC
  Administered 2019-11-27 (×2): 60 mg/h via INTRAVENOUS
  Filled 2019-11-27: qty 200

## 2019-11-27 MED ORDER — CHLORHEXIDINE GLUCONATE CLOTH 2 % EX PADS
6.0000 | MEDICATED_PAD | Freq: Every day | CUTANEOUS | Status: DC
  Administered 2019-11-28 – 2019-12-07 (×7): 6 via TOPICAL

## 2019-11-27 MED ORDER — AMIODARONE LOAD VIA INFUSION
150.0000 mg | Freq: Once | INTRAVENOUS | Status: AC
  Filled 2019-11-27: qty 83.34

## 2019-11-27 MED ORDER — GABAPENTIN 300 MG PO CAPS
600.0000 mg | ORAL_CAPSULE | Freq: Two times a day (BID) | ORAL | Status: DC
  Administered 2019-11-27 – 2019-12-01 (×11): 600 mg via ORAL
  Filled 2019-11-27 (×13): qty 2

## 2019-11-27 MED ORDER — AMIODARONE HCL IN DEXTROSE 360-4.14 MG/200ML-% IV SOLN
INTRAVENOUS | Status: AC
  Administered 2019-11-27: 150 mg via INTRAVENOUS
  Filled 2019-11-27: qty 200

## 2019-11-27 MED ORDER — METOPROLOL TARTRATE 25 MG PO TABS: 12.5000 mg | ORAL_TABLET | Freq: Two times a day (BID) | ORAL | Status: DC

## 2019-11-27 MED ORDER — METOPROLOL TARTRATE 25 MG PO TABS
12.5000 mg | ORAL_TABLET | Freq: Two times a day (BID) | ORAL | Status: DC
  Administered 2019-11-27: 12.5 mg via ORAL
  Filled 2019-11-27: qty 1

## 2019-11-27 MED ORDER — METOPROLOL TARTRATE 5 MG/5ML IV SOLN
INTRAVENOUS | Status: AC
  Filled 2019-11-27: qty 5

## 2019-11-27 MED ORDER — METOPROLOL TARTRATE 25 MG PO TABS: 12.5000 mg | ORAL_TABLET | Freq: Once | ORAL | Status: DC

## 2019-11-27 MED ORDER — SODIUM CHLORIDE 0.9% FLUSH
10.0000 mL | Freq: Two times a day (BID) | INTRAVENOUS | Status: DC
  Administered 2019-11-27 – 2019-12-08 (×13): 10 mL

## 2019-11-27 MED ORDER — AMIODARONE HCL IN DEXTROSE 360-4.14 MG/200ML-% IV SOLN
30.0000 mg/h | INTRAVENOUS | Status: DC
  Administered 2019-11-27 – 2019-11-28 (×2): 30 mg/h via INTRAVENOUS
  Filled 2019-11-27 (×3): qty 200

## 2019-11-27 NOTE — ED Notes (Addendum)
Nurse checked on patient. Pt stated, "she is fine, feels much better".

## 2019-11-27 NOTE — ED Notes (Signed)
Pt provided extra pillow & 2 hot packs to aid with pain mgmt, holding off on pain medication administration at this time due to BP

## 2019-11-27 NOTE — ED Notes (Signed)
Attending Team at bedside

## 2019-11-27 NOTE — ED Notes (Signed)
Lunch Trays Ordered @ 1101. 

## 2019-11-27 NOTE — Consult Note (Addendum)
ELECTROPHYSIOLOGY CONSULT NOTE    Patient ID: Lisa Poole MRN: [id number], DOB/AGE: 01/26/1966 54 y.o.  Admit date: 11/26/2019 Date of Consult: 11/27/2019  Primary Physician: Milford Cage, PA Primary Cardiologist: No primary care provider on file.  Electrophysiologist: Dr. Lovena Le   Referring Provider: Dr. Aundra Dubin  Patient Profile: Lisa Poole is a 54 y.o. female with a history of CHB s/p Medtronic dual chamber PPM 02/2019, SCAF, aortic insuffiency, HTN, HLD, T2DM, and COVID infection 08/2019 who is being seen today for the evaluation of Wide complex tachycardia at the request of Dr. Aundra Dubin .  HPI:  Lisa Poole is a 54 y.o. female with medical history as above.   Pt presented to [facility] with CP 02/2019 (no significant CAD on [facility] 09/2018) and found to be in CHB. She underwent PPM (Medtronic) implant at that time.   She has had SCAF, but has not been on [place] up to this point.   Pt had + COVID 08/2019 but did not require hospitalization.   Pt presented to [facility] 11/26/2019 with progressive dyspnea, intermittent CP, orthopnea, PND, and LE edema over several weeks with acute worsening over the last 2 days.   Pertinent labs on admission include BNP 1258, K 4.2, Mg 1.7, Cr 1.37. CXR w/ developing edema. HS troponing 32>>52>>67. Started on IV amiodarone in ED for [facility] felt to be VT. K 3.2 this am in setting of diuresis.      She has been started on lasix gtt at 8 mg/hr.  Repeat echo showed EF now down to 20-25% w regional WMA (anterior, septal, and lateral). Moderate to severe TR with elevated RVSP. Moderate MR. No LV thrombus noted, RV function felt to be normal.   (EF was 60-65% 09/2018)  She is currently feeling fatigued. She is not SOB at rest, but remains so with exertion. She is slightly diaphoretic and lightheaded. She denies chest pain   Past Medical History:  Diagnosis Date  . Aortic insufficiency   . Asthma 09/13/2006  . Complete heart block ([place]) 02/11/2019  . Cystocele without  uterine prolapse 12/12/2018  . Diabetes mellitus without complication ([place])   . Elevated troponin level   . Hypercholesteremia   . Hypertension   . Primary osteoarthritis of left knee 12/10/2017     Surgical History:  Past Surgical History:  Procedure Laterality Date  . APPENDECTOMY    . LEFT HEART CATH AND CORONARY ANGIOGRAPHY N/A 09/28/2018   Procedure: LEFT HEART CATH AND CORONARY ANGIOGRAPHY;  Surgeon: Troy Sine, MD;  Location: [place] CV LAB;  Service: Cardiovascular;  Laterality: N/A;  . PACEMAKER IMPLANT N/A 02/13/2019   Procedure: PACEMAKER IMPLANT;  Surgeon: Evans Lance, MD;  Location: [place] CV LAB;  Service: Cardiovascular;  Laterality: N/A;  . PACEMAKER IMPLANT    . ROTATOR CUFF REPAIR Bilateral 2017  . SINUS EXPLORATION    . TOTAL KNEE ARTHROPLASTY Left 12/10/2017   Procedure: LEFT TOTAL KNEE ARTHROPLASTY;  Surgeon: Sydnee Cabal, MD;  Location: [facility] ORS;  Service: Orthopedics;  Laterality: Left;  Adductor Block  . TUBAL LIGATION       (Not in a hospital admission)   Inpatient Medications:  . aspirin EC  81 mg Oral Daily  . enoxaparin (LOVENOX) injection  40 mg Subcutaneous QHS  . gabapentin  600 mg Oral BID  . HYDROcodone-acetaminophen  1 tablet Oral QID  . insulin aspart  0-15 Units Subcutaneous TID WC  . pravastatin  10 mg Oral q1800  .  sodium chloride flush  10-40 mL Intracatheter Q12H  . sodium chloride flush  3 mL Intravenous Once  . sodium chloride flush  3 mL Intravenous Q12H  . venlafaxine XR  150 mg Oral Daily    Allergies:  Allergies  Allergen Reactions  . Lisinopril Other (See Comments) and Cough    Flu like symptoms, also   . Ace Inhibitors Other (See Comments)    Flu-like symptoms  . Augmentin [Amoxicillin-Pot Clavulanate] Nausea And Vomiting  . Benazepril Other (See Comments)    Flu like symptoms  . Lotensin [Benazepril Hcl] Other (See Comments)    Flu like symptoms  . Oxycodone-Acetaminophen Nausea And Vomiting  . Sulfa  Antibiotics Nausea And Vomiting    Social History   Socioeconomic History  . Marital status: Married    Spouse name: Not on file  . Number of children: Not on file  . Years of education: Not on file  . Highest education level: Not on file  Occupational History  . Not on file  Tobacco Use  . Smoking status: Never Smoker  . Smokeless tobacco: Never Used  Vaping Use  . Vaping Use: Never used  Substance and Sexual Activity  . Alcohol use: No  . Drug use: No  . Sexual activity: Yes    Birth control/protection: Post-menopausal  Other Topics Concern  . Not on file  Social History Narrative  . Not on file   Social Determinants of Health   Financial Resource Strain:   . Difficulty of Paying Living Expenses:   Food Insecurity:   . Worried About Charity fundraiser in the Last Year:   . Arboriculturist in the Last Year:   Transportation Needs:   . Film/video editor (Medical):   Marland Kitchen Lack of Transportation (Non-Medical):   Physical Activity:   . Days of Exercise per Week:   . Minutes of Exercise per Session:   Stress:   . Feeling of Stress :   Social Connections:   . Frequency of Communication with Friends and Family:   . Frequency of Social Gatherings with Friends and Family:   . Attends Religious Services:   . Active Member of Clubs or Organizations:   . Attends Archivist Meetings:   Marland Kitchen Marital Status:   Intimate Partner Violence:   . Fear of Current or Ex-Partner:   . Emotionally Abused:   Marland Kitchen Physically Abused:   . Sexually Abused:      Family History  Problem Relation Age of Onset  . Colon cancer Maternal Grandmother   . Alzheimer's disease Mother      Review of Systems: All other systems reviewed and are otherwise negative except as noted above.  Physical Exam: Vitals:   11/27/19 1452 11/27/19 1453 11/27/19 1454 11/27/19 1455  BP:      Pulse: 76 79 (!) 108 80  Resp: 16 18 (!) 25 11  Temp:      TempSrc:      SpO2: 100% 100% 98% 100%    Weight:      Height:        GEN- The patient is ill appearing and somewhat diaphoretic. alert and oriented x 3 today.   HEENT: normocephalic, atraumatic; sclera clear, conjunctiva pink; hearing intact; oropharynx clear; neck supple. JVP elevated to jaw. Lungs- Clear to ausculation bilaterally.  No wheezes, rales, rhonchi Heart- Regular rate and rhythm, no murmurs, rubs or gallops GI- soft, non-tender, non-distended, bowel sounds present Extremities- Cool to the touch  with 1-2+ edema.  MS- no significant deformity or atrophy Skin- warm and dry, no rash or lesion Psych- flat but appropriate affect.  Neuro- strength and sensation are intact  Labs:   Lab Results  Component Value Date   WBC 8.3 11/26/2019   HGB 13.8 11/26/2019   HCT 46.1 (H) 11/26/2019   MCV 90.2 11/26/2019   PLT 311 11/26/2019    Recent Labs  Lab 11/27/19 0402  NA 141  K 3.2*  CL 101  CO2 29  BUN 22*  CREATININE 1.18*  CALCIUM 8.8*  GLUCOSE 130*      Radiology/Studies: DG Chest Portable 1 View  Result Date: 11/26/2019 CLINICAL DATA:  Shortness of breath. EXAM: PORTABLE CHEST 1 VIEW COMPARISON:  08/16/2019 FINDINGS: There are prominent interstitial lung markings bilaterally. There is scattered hazy airspace opacities. The heart size is enlarged. Aortic calcifications are noted. There is a dual chamber left-sided pacemaker in place. There may be small bilateral pleural effusions. There is no pneumothorax. No acute osseous abnormality. Calcified lymph nodes are noted. IMPRESSION: Findings concerning for developing pulmonary edema versus an atypical infectious process in the appropriate clinical setting. Electronically Signed   By: Constance Holster M.D.   On: 11/26/2019 20:15   ECHOCARDIOGRAM COMPLETE  Result Date: 11/27/2019    ECHOCARDIOGRAM REPORT   Patient Name:   Lisa Poole Date of Exam: 11/27/2019 Medical Rec #:  [id number]      Height:       60.0 in Accession #:    [id number]     Weight:       185.0  lb Date of Birth:  25-Sep-1965       BSA:          1.806 m Patient Age:    42 years       BP:           131/107 mmHg Patient Gender: F              HR:           120 bpm. Exam Location:  Inpatient Procedure: 2D Echo, Color Doppler, Cardiac Doppler and Intracardiac            Opacification Agent STAT ECHO Indications:    I50.9* Heart failure (unspecified)  History:        Patient has prior history of Echocardiogram examinations, most                 recent 09/28/2018. Pacemaker; Risk Factors:Hypertension, Diabetes                 and Dyslipidemia. COVID+ on 08/16/19.  Sonographer:    Raquel Sarna Senior RDCS Referring Phys: [phone] [facility] PFEIFFER  Sonographer Comments: Definity used to assess for thrombus IMPRESSIONS  1. Left ventricular ejection fraction, by estimation, is 20 to 25%. The left ventricle has severely decreased function. The left ventricle demonstrates regional wall motion abnormalities (see scoring diagram/findings for description). The left ventricular internal cavity size was mildly dilated. Left ventricular diastolic parameters are indeterminate.  2. Right ventricular systolic function is normal. The right ventricular size is mildly enlarged. There is moderately elevated pulmonary artery systolic pressure. The estimated right ventricular systolic pressure is 95.0 mmHg.  3. Left atrial size was mildly dilated.  4. Right atrial size was moderately dilated.  5. The mitral valve is normal in structure. Moderate mitral valve regurgitation.  6. Tricuspid valve regurgitation is moderate to severe.  7. The aortic valve is grossly normal. Aortic valve  regurgitation is mild.  8. The inferior vena cava is normal in size with <50% respiratory variability, suggesting right atrial pressure of 8 mmHg. Comparison(s): Changes from prior study are noted. Conclusion(s)/Recommendation(s): EF severely reduced compared to prior studies. Wall motion abnormalities, most prominent in anterior, septal, and lateral walls. Moderate to  severe TR with elevated RVSP. Moderate MR. LV thrombus excluded by definity contrast. Brief VT during the exam. FINDINGS  Left Ventricle: Left ventricular ejection fraction, by estimation, is 20 to 25%. The left ventricle has severely decreased function. The left ventricle demonstrates regional wall motion abnormalities. Definity contrast agent was given IV to delineate the left ventricular endocardial borders. The left ventricular internal cavity size was mildly dilated. There is no left ventricular hypertrophy. Left ventricular diastolic parameters are indeterminate.  LV Wall Scoring: The entire anterior wall, antero-lateral wall, entire anterior septum, and entire apex are akinetic. The inferior wall, posterior wall, mid inferoseptal segment, and basal inferoseptal segment are hypokinetic. Right Ventricle: The right ventricular size is mildly enlarged. Right vetricular wall thickness was not assessed. Right ventricular systolic function is normal. There is moderately elevated pulmonary artery systolic pressure. The tricuspid regurgitant velocity is 3.16 m/s, and with an assumed right atrial pressure of 8 mmHg, the estimated right ventricular systolic pressure is 66.4 mmHg. Left Atrium: Left atrial size was mildly dilated. Right Atrium: Right atrial size was moderately dilated. Pericardium: There is no evidence of pericardial effusion. Mitral Valve: The mitral valve is normal in structure. Moderate mitral valve regurgitation. Tricuspid Valve: The tricuspid valve is normal in structure. Tricuspid valve regurgitation is moderate to severe. Aortic Valve: The aortic valve is grossly normal.. There is mild thickening and mild calcification of the aortic valve. Aortic valve regurgitation is mild. There is mild thickening of the aortic valve. There is mild calcification of the aortic valve. Pulmonic Valve: The pulmonic valve was not well visualized. Pulmonic valve regurgitation is not visualized. No evidence of pulmonic  stenosis. Aorta: The aortic root and ascending aorta are structurally normal, with no evidence of dilitation. Venous: The inferior vena cava is normal in size with less than 50% respiratory variability, suggesting right atrial pressure of 8 mmHg. IAS/Shunts: The atrial septum is grossly normal. Additional Comments: A pacer wire is visualized in the right atrium and right ventricle.  LEFT VENTRICLE PLAX 2D LVIDd:         5.80 cm LVIDs:         5.00 cm LV PW:         0.90 cm LV IVS:        0.70 cm LVOT diam:     1.80 cm LV SV:         23 LV SV Index:   13 LVOT Area:     2.54 cm  RIGHT VENTRICLE RV S prime:     12.10 cm/s TAPSE (M-mode): 2.5 cm LEFT ATRIUM             Index       RIGHT ATRIUM           Index LA diam:        4.40 cm 2.44 cm/m  RA Area:     22.10 cm LA Vol (A2C):   78.4 ml 43.42 ml/m RA Volume:   69.20 ml  38.32 ml/m LA Vol (A4C):   56.8 ml 31.45 ml/m LA Biplane Vol: 66.8 ml 36.99 ml/m  AORTIC VALVE LVOT Vmax:   59.75 cm/s LVOT Vmean:  43.100 cm/s LVOT VTI:  0.090 m  AORTA Ao Root diam: 2.60 cm TRICUSPID VALVE TR Peak grad:   39.9 mmHg TR Vmax:        316.00 cm/s  SHUNTS Systemic VTI:  0.09 m Systemic Diam: 1.80 cm Buford Dresser MD Electronically signed by Buford Dresser MD Signature Date/Time: 11/27/2019/10:28:38 AM    Final    CUP PACEART REMOTE DEVICE CHECK  Result Date: 11/17/2019 Scheduled remote reviewed. Normal device function.  There were 102 VT-NS and VT-Monitor episodes. The fastest was 207 bpm and the longest was 2 min 10 sec. Routing to triage. Next remote 91 days. Felisa Bonier, RN, MSN   EKG: on arrival shows V pacing at 108. Subsequent EKGs show a wide complex tachycardia in the 160-170 range  (personally reviewed)  TELEMETRY: V pacing with runs of VT in 150-170 range (she does not have in-tact A-V conduction) (personally reviewed)  DEVICE HISTORY: MDT dual chamber PPM implanted 02/2019 for CHB/ symptomatic bradycardia  Assessment/Plan: 1.  WCT New. Recent  device report does show NSVT and VT.  This has been undercounted by her device, with VT monitor rate set to > 170. We lowered to 150 today. EF newly reduced to 20-25% as below Plan for optimization and then [facility] per Dr. Aundra Dubin Continue amiodarone. Rebolused this afternoon with recurrent VT.  Pt may benefit from cMRI. Her device is MRI compatible and this was placed 02/2019. Will need to avoid BB for now in setting of acute decompensated CHF.  2. CHB s/p MDT DDD pacer Stable device function  3. Acute systolic CHF,  EF down to 20-25% from 60-65% last may [facility] 09/2018 without significant CAD, but now with new low EF plan repeat per Dr. Aundra Dubin once stable. She remains volume overloaded and on lasix gtt at 8 mg/hr.  With arrhythmias, goal K >3.9 and Mg >1.9. AHF team following DDx for newly reduced EF includes CAD (though cath without significant CAD in 09/2018), Chronic RV pacing, or post-viral from COVID infection in 08/2019  4. Hypokalemia 3.2 this am in the setting of diuresis. She received 40 meq po and 10 meq x 4 IV (total of 80 meq)  AHF team managing CHF. Pt is acutely ill. EP will follow along.   For questions or updates, please contact [place] Please consult [email or web address] for contact info under Cardiology/STEMI.  Jacalyn Lefevre, PA-C  11/27/2019 3:18 PM  Ventricular tachycardia-recurrent-variable morphologies-sustained  Cardiomyopathy-new  Congestive heart failure-acute/chronic/systolic  Complete heart block  Pacemaker-left bundle branch block area pacing  Hypokalemia  Covid infection 4/21    The the patient has new biventricular dysfunction with congestive heart failure.  Acute decompensation sounds like associated with recurrent tachypalpitations consistent by electrocardiogram with ventricular tachycardia (VA dissociation).  In this regard worth noting that there are multiple morphologies.  Currently on amiodarone appropriately.  Patient  has a significant increase in ventricular ectopy burden over the last 6 months.  This raises the possibility that the ectopy is responsible for the cardiomyopathy; could also be the opposite is true.    She underwent left bundle branch area pacing.  This is being utilized as an alternative to CRT; the QRS duration in her case is 138 ms.  Is not clear to me whether pacing in this situation could also be responsible for cardiomyopathy although the answer may be moot.  With LV dysfunction and sustained ventricular tachycardia probably appropriate to consider ICD upgrade.  This would require removal or capping of the right ventricular/left BBB area pacing lead  and insertion of an ICD lead and at that point in LV lead.  She has had gradually progressing symptoms over the last 4+ weeks of heart failure with orthopnea and dyspnea on exertion and peripheral edema.  The final event as noted above may have been the VT.  This makes Covid involvement of her heart muscle a possible explanation.  Another issue unresolved is why she had complete heart block is a 54 year old lady anyway.  It raises the possibility of things like sarcoid and other infiltrative disease..  For now 1- agree with amiodarone 2-grateful to heart failure service for managing the acute decompensation 3-we will discuss with Dr. Elliot Cousin next steps related to device upgrade

## 2019-11-27 NOTE — Progress Notes (Addendum)
  Amiodarone Drug - Drug Interaction Consult Note  Recommendations: -Watch for muscle pain/weakness with patient on statin. -Monitor for increased risk of bradycardia, AV block and myocardial depression on beta blocker. -Watch for increased risk of cardiotoxicity on Lasix if hypokalemia occurs.  -Watch QTc - venlafaxine has rare (<1%, postmarketing) reports. Communicated with MD - last QTc up to 552 this morning. Dr. Matilde Sprang to repeat EKG.  Amiodarone is metabolized by the cytochrome P450 system and therefore has the potential to cause many drug interactions. Amiodarone has an average plasma half-life of 50 days (range 20 to 100 days).   There is potential for drug interactions to occur several weeks or months after stopping treatment and the onset of drug interactions may be slow after initiating amiodarone.   [x]  Statins: Increased risk of myopathy. Simvastatin- restrict dose to 20mg  daily. Other statins: counsel patients to report any muscle pain or weakness immediately. *Pravastatin  []  Anticoagulants: Amiodarone can increase anticoagulant effect. Consider warfarin dose reduction. Patients should be monitored closely and the dose of anticoagulant altered accordingly, remembering that amiodarone levels take several weeks to stabilize.  []  Antiepileptics: Amiodarone can increase plasma concentration of phenytoin, the dose should be reduced. Note that small changes in phenytoin dose can result in large changes in levels. Monitor patient and counsel on signs of toxicity.  [x]  Beta blockers: increased risk of bradycardia, AV block and myocardial depression. Sotalol - avoid concomitant use. *Lopressor  []   Calcium channel blockers (diltiazem and verapamil): increased risk of bradycardia, AV block and myocardial depression.  []   Cyclosporine: Amiodarone increases levels of cyclosporine. Reduced dose of cyclosporine is recommended.  []  Digoxin dose should be halved when amiodarone is  started.  [x]  Diuretics: increased risk of cardiotoxicity if hypokalemia occurs. *Lasix - replacing K and Mag this morning  []  Oral hypoglycemic agents (glyburide, glipizide, glimepiride): increased risk of hypoglycemia. Patient's glucose levels should be monitored closely when initiating amiodarone therapy.   [x]  Drugs that prolong the QT interval:  Torsades de pointes risk may be increased with concurrent use - avoid if possible.  Monitor QTc, also keep magnesium/potassium WNL if concurrent therapy can't be avoided. Marland Kitchen Antibiotics: e.g. fluoroquinolones, erythromycin. . Antiarrhythmics: e.g. quinidine, procainamide, disopyramide, sotalol. . Antipsychotics: e.g. phenothiazines, haloperidol.  . Lithium, tricyclic antidepressants, and methadone.   *Venlafaxine - prolonged QTc risk (rare, <1% postmarketing AE)  Thank Sandi Mariscal Dohlen  11/27/2019 8:28 AM

## 2019-11-27 NOTE — Progress Notes (Signed)
Placed pt on nasal cannula, Pt tolerating well at this time. Pt stated breathing is much better. BiPAP at bedside if pt needs it .

## 2019-11-27 NOTE — Progress Notes (Addendum)
Subjective: HD#1   Overnight: Admitted  During rounds this morning,  Lisa Poole complained of "feeling off." Moments before our arrival, nursing staff had reported that she had several runs of V-Tach. Upon arriving in the room, she continued to have intermittent runs of V-Tach for which she was started on amiodarone bolus and infusion. She did complained of chest tightness during these episode but never lost pulse or became unconscious. Once amiodarone was started, her HR improved and I did not observe any further episodes of VT.  She reports that after her Covid diagnosis she experiences intermittent dyspnea however her symptoms began to worsen about 4 weeks ago.  Objective:  Vital signs in last 24 hours: Vitals:   11/27/19 1003 11/27/19 1004 11/27/19 1005 11/27/19 1006  BP:      Pulse: 95 94 92 97  Resp: 11 12 10 20   Temp:      TempSrc:      SpO2: 99% 99% 99% 99%  Weight:      Height:       Const: In mild to moderate distress Resp: Difficult to assess due to intermittently acutely distressed CV: Intermittent ventricular tachycardia with heart rate up to 170s-180s, no murmurs appreciated Ext: Lower extremities are cool to the touch  Assessment/Plan:  Principal Problem:   Acute decompensated heart failure (HCC) Active Problems:   HTN (hypertension)   Complete heart block [facility])   Pacemaker  Lisa Poole is a very pleasant woman with medical history significant for prior Covid infection, NSTEMI, complete heart block status post pacemaker, diabetes mellitus, hypertension and hyperlipidemia who presented with progressively worsening shortness of breath here for management of acute decompensated heart failure   #New onset acute decompensated heart failure (EF 20-25% 2021) #Nonischemic cardiomyopathy #?Post-COVID Cardiomyopathy Appears to be in distress this morning when she complained of chest pressure each time she experienced intermittent VT.  Her lower extremities were  cool to touch.  She had soft blood pressures however MAP was unremarkable.  Echocardiogram today showed an ejection fraction of 20-25% with regional wall motion abnormalities compared to prior echocardiogram in 2020 which showed an LVEF of 60 to 65%. -Discontinue IV Lasix 80 mg BID and start Lasix infusion at 8 mg/hour -Appreciate cardiology recommendations -Daily weights -Strict I's and O's -Keep potassium above 4 and magnesium above 2 -Supplemental oxygen to keep SPO2 above 88% -Continue low-dose Lopressor 12.5 mg twice daily to prevent rebound tachycardia   #Intermittent ventricular tachycardia She had several episodes of intermittent ventricular tachycardia with heart rate up to 170s-180s that improved with amiodarone.  Most likely secondary to her structural heart disease in addition to her electrolyte derangements.  Improved with amiodarone bolus and infusion -Continue amiodarone infusion -Monitor QTc closely -Replace electrolytes   #NSTEMI (less likely type I) Troponin level 32>> 52>> 67.  Several EKGs have been obtained and if you have them shows ST depression in the lateral leads -Serial EKGs Serial troponin   #Acute kidney injury #Cardiorenal syndrome sCr 1.1<<1.3 (Baseline 0.8) -Continue to monitor   #Hypertension -Hold losartan -Resume metoprolol at low-dose 12.5 mg twice daily   #Diabetes mellitus -Continue SSI -We will benefit from GLP-1 agonist or SGLT2 antagonist at discharge   #Hyperlipidemia -Continue pravastatin   #Peripheral neuropathy -Continue gabapentin, Norco   FEN: Low-salt diet VTE ppx: Subcutaneous Lovenox CODE STATUS: Full code  Prior to Admission Living Arrangement: Home Anticipated Discharge Location: Home Barriers to Discharge: Treatment of CHF Dispo: Anticipated discharge in approximately 2-3 day(s).  Lisa Rosenthal, MD 11/27/2019, 10:47 AM Pager: [phone] Internal Medicine Teaching Service After 5pm on weekdays and 1pm  on weekends: On Call pager: [phone]

## 2019-11-27 NOTE — Consult Note (Addendum)
Advanced Heart Failure Team Consult Note   Primary Physician: Milford Cage, PA PCP-Cardiologist:  Dr. Lovena Le  Reason for Consultation: New cardiomyopathy/ acute systolic heart failure   HPI:    Lisa Poole is seen today for evaluation of new cardiomyopathy/ acute systolic heart failure at the request of Dr. Daryll Drown, Internal Medicine.   54 y/o female w/ h/o HTN, HLD, T2DM, CHB s/p PPM implant 02/2019 followed by Dr. Lovena Le, PAF (not on a/c), aortic insufficiency and recent COVID infection in April 2021.   In May 2020, she underwent w/u for SOB and mildly elevated troponin I  (0.09>>0.10>>0.12>>0.15). LHC 09/2018 showed no significant coronary disease.There was minimal systolic bridging of 5 to 10% in the mid LAD. The left circumflex vessel wss normal. The RCA is a dominant vessel with a superior takeoff and was normal. Echo 09/2018. LVEF 60-65%, mild LVH, RV normal. Mild- mod AI. No stenosis.   In 02/2019, she presented w/ chest pain and found to be in CHB and underwent PPM inplant ([employer]). She is followed by Dr. Lovena Le. EP notes outline detection of PAF but episodes have been very brief, thus she has not bee on anticoagulation.   In April 2021, she was diagnosed w/ COVID but did not require hospitalization. She has not been vaccinated.   She now presents to the [facility] w/ complaint of progressive dyspnea, intermittent CP, orthopnea, PND and LEE. Symptoms present for several weeks but worsened over the last 2 days, progressing to symptoms at rest.   In ED, found to be in acute CHF. BNP 1,258 . CXR w/ developing edema. HS troponin 32>>52>>67. EKG and tele w/ WCT, felt to be VT. Rates in the 160s-170s. BP has been soft but stable. Started on IV amiodarone in ED. Initial K 4.2>>3.2 today. Mg 1.7. SCr 1.37>>1.18. She has been given a dose of IV Lasix in the ED w/ orders to start lasix gtt at 8 mg/hr.   Echo has been repeated and shows new drop in LVEF, down to 20-25% w/ regional  wall motion abnormalties,  most prominent in anterior, septal, and lateral walls. Moderate to severe TR with elevated RVSP. Moderate MR. LV thrombus excluded by definity contrast. RV systolic function normal.   She is currently CP free. No resting dyspnea currently. Mildly diaphoretic. NSR on tele currently w/ frequent PVCs.      Echo 11/27/19 1. Left ventricular ejection fraction, by estimation, is 20 to 25%. The  left ventricle has severely decreased function. The left ventricle  demonstrates regional wall motion abnormalities (see scoring  diagram/findings for description). The left  ventricular internal cavity size was mildly dilated. Left ventricular  diastolic parameters are indeterminate.  2. Right ventricular systolic function is normal. The right ventricular  size is mildly enlarged. There is moderately elevated pulmonary artery  systolic pressure. The estimated right ventricular systolic pressure is  48.2 mmHg.  3. Left atrial size was mildly dilated.  4. Right atrial size was moderately dilated.  5. The mitral valve is normal in structure. Moderate mitral valve  regurgitation.  6. Tricuspid valve regurgitation is moderate to severe.  7. The aortic valve is grossly normal. Aortic valve regurgitation is  mild.  8. The inferior vena cava is normal in size with <50% respiratory  variability, suggesting right atrial pressure of 8 mmHg.   Comparison(s): Changes from prior study are noted.   Conclusion(s)/Recommendation(s): EF severely reduced compared to prior  studies. Wall motion abnormalities, most prominent in anterior, septal,  and lateral walls. Moderate to severe TR with elevated RVSP. Moderate MR.  LV thrombus excluded by definity  contrast. Brief VT during the exam.   Review of Systems: [y] = yes, _0  = no   . General: Weight gain _1 ; Weight loss _2 ; Anorexia _3 ; Fatigue [Y ]; Fever _4 ; Chills _5 ; Weakness _6   . Cardiac: Chest pain/pressure [ Y];  Resting SOB [ Y]; Exertional SOB [ Y]; Orthopnea [ Y]; Pedal Edema [ Y]; Palpitations [Y ]; Syncope _7 ; Presyncope _8 ; Paroxysmal nocturnal dyspnea[ Y]  . Pulmonary: Cough _9 ; Wheezing_10 ; Hemoptysis_11 ; Sputum _12 ; Snoring _13   . GI: Vomiting_14 ; Dysphagia_15 ; Melena_16 ; Hematochezia _17 ; Heartburn_18 ; Abdominal pain _19 ; Constipation _20 ; Diarrhea _21 ; BRBPR _22   . GU: Hematuria_23 ; Dysuria _24 ; Nocturia_25   . Vascular: Pain in legs with walking _26 ; Pain in feet with lying flat _27 ; Non-healing sores _28 ; Stroke _29 ; TIA _30 ; Slurred speech _31 ;  . Neuro: Headaches_32 ; Vertigo_33 ; Seizures_34 ; Paresthesias_35 ;Blurred vision _36 ; Diplopia _37 ; Vision changes _38   . Ortho/Skin: Arthritis _39 ; Joint pain _40 ; Muscle pain _41 ; Joint swelling _42 ; Back Pain _43 ; Rash _44   . Psych: Depression_45 ; Anxiety_46   . Heme: Bleeding problems _47 ; Clotting disorders _48 ; Anemia _49   . Endocrine: Diabetes _50 ; Thyroid dysfunction_51   Home Medications Prior to Admission medications   Medication Sig Start Date End Date Taking? Authorizing Provider  aspirin EC 81 MG tablet Take 81 mg by mouth daily. Swallow whole.   Yes [provider]  Biotin 10 MG CAPS Take 10 mg by mouth daily.   Yes [provider]  cetirizine (ZYRTEC) 10 MG tablet Take 10 mg by mouth daily.   Yes [provider]  Cholecalciferol (VITAMIN D-3) 25 MCG (1000 UT) CAPS Take 1,000 Units by mouth daily.    Yes [provider]  gabapentin (NEURONTIN) 600 MG tablet Take 600 mg by mouth 2 (two) times daily.   Yes [provider]  hydrochlorothiazide (HYDRODIURIL) 25 MG tablet Take 25 mg by mouth daily. 09/25/19  Yes [provider]  HYDROcodone-acetaminophen (NORCO) 10-325 MG tablet Take 1 tablet by mouth 4 (four) times daily.    Yes [provider]  losartan (COZAAR) 25 MG tablet Take 0.5 tablets (12.5 mg total) by mouth daily. 09/29/18  Yes Reino Bellis B, NP  lovastatin  (MEVACOR) 10 MG tablet Take 10 mg by mouth at bedtime.    Yes [provider]  meloxicam (MOBIC) 15 MG tablet Take 15 mg by mouth daily. 02/05/19  Yes [provider]  metFORMIN (GLUCOPHAGE) 500 MG tablet Take 500 mg by mouth 2 (two) times daily. 07/26/19  Yes [provider]  metoprolol tartrate (LOPRESSOR) 25 MG tablet TAKE 1 TABLET BY MOUTH TWICE DAILY Patient taking differently: Take 25 mg by mouth 2 (two) times daily.  07/17/19  Yes Shirley Friar, PA-C  Multiple Vitamins-Minerals (CENTRUM SILVER 50+WOMEN) TABS Take 1 tablet by mouth daily.   Yes [provider]  naloxone (NARCAN) 4 MG/0.1ML LIQD nasal spray kit Place 1 spray into the nose as directed.   Yes [provider]  omeprazole (PRILOSEC) 40 MG capsule Take 40 mg by mouth daily.   Yes [provider]  ondansetron (ZOFRAN ODT) 4 MG disintegrating  tablet Take 1 tablet (4 mg total) by mouth every 8 (eight) hours as needed for nausea or vomiting. 10/20/19  Yes Lucrezia Starch, MD  venlafaxine XR (EFFEXOR-XR) 150 MG 24 hr capsule Take 150 mg by mouth daily. 05/07/19  Yes [provider]  acetaminophen (TYLENOL) 325 MG tablet Take 1-2 tablets (325-650 mg total) by mouth every 4 (four) hours as needed for mild pain. Patient not taking: Reported on 11/26/2019 02/14/19   Shirley Friar, PA-C  albuterol (PROVENTIL HFA;VENTOLIN HFA) 108 [phone] Base) MCG/ACT inhaler Inhale 1-2 puffs into the lungs every 6 (six) hours as needed for wheezing or shortness of breath. 07/30/18   Drenda Freeze, MD    Past Medical History: Past Medical History:  Diagnosis Date  . Aortic insufficiency   . Asthma 09/13/2006  . Complete heart block ([place]) 02/11/2019  . Cystocele without uterine prolapse 12/12/2018  . Diabetes mellitus without complication ([place])   . Elevated troponin level   . Hypercholesteremia   . Hypertension   . Primary osteoarthritis of left knee 12/10/2017    Past Surgical  History: Past Surgical History:  Procedure Laterality Date  . APPENDECTOMY    . LEFT HEART CATH AND CORONARY ANGIOGRAPHY N/A 09/28/2018   Procedure: LEFT HEART CATH AND CORONARY ANGIOGRAPHY;  Surgeon: Troy Sine, MD;  Location: [facility];  Service: Cardiovascular;  Laterality: N/A;  . PACEMAKER IMPLANT N/A 02/13/2019   Procedure: PACEMAKER IMPLANT;  Surgeon: Evans Lance, MD;  Location: [facility];  Service: Cardiovascular;  Laterality: N/A;  . PACEMAKER IMPLANT    . ROTATOR CUFF REPAIR Bilateral 2017  . SINUS EXPLORATION    . TOTAL KNEE ARTHROPLASTY Left 12/10/2017   Procedure: LEFT TOTAL KNEE ARTHROPLASTY;  Surgeon: Sydnee Cabal, MD;  Location: [facility];  Service: Orthopedics;  Laterality: Left;  Adductor Block  . TUBAL LIGATION      Family History: Family History  Problem Relation Age of Onset  . Colon cancer Maternal Grandmother   . Alzheimer's disease Mother     Social History: Social History   Socioeconomic History  . Marital status: Married    Spouse name: Not on file  . Number of children: Not on file  . Years of education: Not on file  . Highest education level: Not on file  Occupational History  . Not on file  Tobacco Use  . Smoking status: Never Smoker  . Smokeless tobacco: Never Used  Vaping Use  . Vaping Use: Never used  Substance and Sexual Activity  . Alcohol use: No  . Drug use: No  . Sexual activity: Yes    Birth control/protection: Post-menopausal  Other Topics Concern  . Not on file  Social History Narrative  . Not on file   Social Determinants of Health   Financial Resource Strain:   . Difficulty of Paying Living Expenses:   Food Insecurity:   . Worried About Charity fundraiser in the Last Year:   . Arboriculturist in the Last Year:   Transportation Needs:   . Film/video editor (Medical):   Marland Kitchen Lack of Transportation (Non-Medical):   Physical Activity:   . Days of Exercise per Week:   . Minutes of Exercise per  Session:   Stress:   . Feeling of Stress :   Social Connections:   . Frequency of Communication with Friends and Family:   . Frequency of Social Gatherings with Friends and Family:   . Attends  Religious Services:   . Active Member of Clubs or Organizations:   . Attends Archivist Meetings:   Marland Kitchen Marital Status:     Allergies:  Allergies  Allergen Reactions  . Lisinopril Other (See Comments) and Cough    Flu like symptoms, also   . Ace Inhibitors Other (See Comments)    Flu-like symptoms  . Augmentin [Amoxicillin-Pot Clavulanate] Nausea And Vomiting  . Benazepril Other (See Comments)    Flu like symptoms  . Lotensin [Benazepril Hcl] Other (See Comments)    Flu like symptoms  . Oxycodone-Acetaminophen Nausea And Vomiting  . Sulfa Antibiotics Nausea And Vomiting    Objective:    Vital Signs:   Temp:  [98.4 F (36.9 C)] 98.4 F (36.9 C) (07/18 1850) Pulse Rate:  [25-182] 97 (07/19 1006) Resp:  [0-30] 20 (07/19 1006) BP: (86-148)/(69-131) 108/88 (07/19 1000) SpO2:  [73 %-100 %] 99 % (07/19 1006) FiO2 (%):  [40 %] 40 % (07/18 2029) Weight:  [83.9 kg] 83.9 kg (07/18 1850)    Weight change: Filed Weights   11/26/19 1850  Weight: 83.9 kg    Intake/Output:   Intake/Output Summary (Last 24 hours) at 11/27/2019 1158 Last data filed at 11/26/2019 2327 Gross per 24 hour  Intake --  Output 1200 ml  Net -1200 ml      Physical Exam    General:  fatigue appearing, mildly diaphoretic No resp difficulty HEENT: normal Neck: supple. JVP elevated to jawline . Carotids 2+ bilat; no bruits. No lymphadenopathy or thyromegaly appreciated. Cor: PMI nondisplaced. Irregular rhythm (PVCs). No rubs, gallops or murmurs. Lungs: clear Abdomen: obese, soft, nontender, nondistended. No hepatosplenomegaly. No bruits or masses. Good bowel sounds. Extremities: no cyanosis, clubbing, rash, edema Neuro: alert & orientedx3, cranial nerves grossly intact. moves all 4 extremities w/o  difficulty. Affect pleasant   Telemetry   Runs of WCT 160s-170s. Currently NSR w/ frequent PVCs   EKG    WCT 166 bpm   Labs   Basic Metabolic Panel: Recent Labs  Lab 11/26/19 1908 11/26/19 2012 11/27/19 0402  NA 141  --  141  K 4.4  --  3.2*  CL 103  --  101  CO2 27  --  29  GLUCOSE 160*  --  130*  BUN 24*  --  22*  CREATININE 1.37*  --  1.18*  CALCIUM 9.0  --  8.8*  MG  --  1.7  --   PHOS  --  3.5  --     Liver Function Tests: No results for input(s): AST, ALT, ALKPHOS, BILITOT, PROT, ALBUMIN in the last 168 hours. No results for input(s): LIPASE, AMYLASE in the last 168 hours. No results for input(s): AMMONIA in the last 168 hours.  CBC: Recent Labs  Lab 11/26/19 1908  WBC 8.3  HGB 13.8  HCT 46.1*  MCV 90.2  PLT 311    Cardiac Enzymes: No results for input(s): CKTOTAL, CKMB, CKMBINDEX, TROPONINI in the last 168 hours.  BNP: BNP (last 3 results) Recent Labs    11/26/19 2011  BNP 1,258.1*    ProBNP (last 3 results) No results for input(s): PROBNP in the last 8760 hours.   CBG: Recent Labs  Lab 11/27/19 0712  GLUCAP 142*    Coagulation Studies: No results for input(s): LABPROT, INR in the last 72 hours.   Imaging   DG Chest Portable 1 View  Result Date: 11/26/2019 CLINICAL DATA:  Shortness of breath. EXAM: PORTABLE CHEST 1 VIEW COMPARISON:  08/16/2019 FINDINGS: There are prominent interstitial lung markings bilaterally. There is scattered hazy airspace opacities. The heart size is enlarged. Aortic calcifications are noted. There is a dual chamber left-sided pacemaker in place. There may be small bilateral pleural effusions. There is no pneumothorax. No acute osseous abnormality. Calcified lymph nodes are noted. IMPRESSION: Findings concerning for developing pulmonary edema versus an atypical infectious process in the appropriate clinical setting. Electronically Signed   By: Constance Holster M.D.   On: 11/26/2019 20:15   ECHOCARDIOGRAM  COMPLETE  Result Date: 11/27/2019    ECHOCARDIOGRAM REPORT   Patient Name:   Lisa Poole Date of Exam: 11/27/2019 Medical Rec #:  [id number]      Height:       60.0 in Accession #:    [id number]     Weight:       185.0 lb Date of Birth:  1965/11/09       BSA:          1.806 m Patient Age:    34 years       BP:           131/107 mmHg Patient Gender: F              HR:           120 bpm. Exam Location:  Inpatient Procedure: 2D Echo, Color Doppler, Cardiac Doppler and Intracardiac            Opacification Agent STAT ECHO Indications:    I50.9* Heart failure (unspecified)  History:        Patient has prior history of Echocardiogram examinations, most                 recent 09/28/2018. Pacemaker; Risk Factors:Hypertension, Diabetes                 and Dyslipidemia. COVID+ on 08/16/19.  Sonographer:    Raquel Sarna Senior RDCS Referring Phys: [phone] [facility] PFEIFFER  Sonographer Comments: Definity used to assess for thrombus IMPRESSIONS  1. Left ventricular ejection fraction, by estimation, is 20 to 25%. The left ventricle has severely decreased function. The left ventricle demonstrates regional wall motion abnormalities (see scoring diagram/findings for description). The left ventricular internal cavity size was mildly dilated. Left ventricular diastolic parameters are indeterminate.  2. Right ventricular systolic function is normal. The right ventricular size is mildly enlarged. There is moderately elevated pulmonary artery systolic pressure. The estimated right ventricular systolic pressure is 81.8 mmHg.  3. Left atrial size was mildly dilated.  4. Right atrial size was moderately dilated.  5. The mitral valve is normal in structure. Moderate mitral valve regurgitation.  6. Tricuspid valve regurgitation is moderate to severe.  7. The aortic valve is grossly normal. Aortic valve regurgitation is mild.  8. The inferior vena cava is normal in size with <50% respiratory variability, suggesting right atrial pressure of 8 mmHg.  Comparison(s): Changes from prior study are noted. Conclusion(s)/Recommendation(s): EF severely reduced compared to prior studies. Wall motion abnormalities, most prominent in anterior, septal, and lateral walls. Moderate to severe TR with elevated RVSP. Moderate MR. LV thrombus excluded by definity contrast. Brief VT during the exam. FINDINGS  Left Ventricle: Left ventricular ejection fraction, by estimation, is 20 to 25%. The left ventricle has severely decreased function. The left ventricle demonstrates regional wall motion abnormalities. Definity contrast agent was given IV to delineate the left ventricular endocardial borders. The left ventricular internal cavity size was mildly dilated. There is no left ventricular  hypertrophy. Left ventricular diastolic parameters are indeterminate.  LV Wall Scoring: The entire anterior wall, antero-lateral wall, entire anterior septum, and entire apex are akinetic. The inferior wall, posterior wall, mid inferoseptal segment, and basal inferoseptal segment are hypokinetic. Right Ventricle: The right ventricular size is mildly enlarged. Right vetricular wall thickness was not assessed. Right ventricular systolic function is normal. There is moderately elevated pulmonary artery systolic pressure. The tricuspid regurgitant velocity is 3.16 m/s, and with an assumed right atrial pressure of 8 mmHg, the estimated right ventricular systolic pressure is 12.8 mmHg. Left Atrium: Left atrial size was mildly dilated. Right Atrium: Right atrial size was moderately dilated. Pericardium: There is no evidence of pericardial effusion. Mitral Valve: The mitral valve is normal in structure. Moderate mitral valve regurgitation. Tricuspid Valve: The tricuspid valve is normal in structure. Tricuspid valve regurgitation is moderate to severe. Aortic Valve: The aortic valve is grossly normal.. There is mild thickening and mild calcification of the aortic valve. Aortic valve regurgitation is mild.  There is mild thickening of the aortic valve. There is mild calcification of the aortic valve. Pulmonic Valve: The pulmonic valve was not well visualized. Pulmonic valve regurgitation is not visualized. No evidence of pulmonic stenosis. Aorta: The aortic root and ascending aorta are structurally normal, with no evidence of dilitation. Venous: The inferior vena cava is normal in size with less than 50% respiratory variability, suggesting right atrial pressure of 8 mmHg. IAS/Shunts: The atrial septum is grossly normal. Additional Comments: A pacer wire is visualized in the right atrium and right ventricle.  LEFT VENTRICLE PLAX 2D LVIDd:         5.80 cm LVIDs:         5.00 cm LV PW:         0.90 cm LV IVS:        0.70 cm LVOT diam:     1.80 cm LV SV:         23 LV SV Index:   13 LVOT Area:     2.54 cm  RIGHT VENTRICLE RV S prime:     12.10 cm/s TAPSE (M-mode): 2.5 cm LEFT ATRIUM             Index       RIGHT ATRIUM           Index LA diam:        4.40 cm 2.44 cm/m  RA Area:     22.10 cm LA Vol (A2C):   78.4 ml 43.42 ml/m RA Volume:   69.20 ml  38.32 ml/m LA Vol (A4C):   56.8 ml 31.45 ml/m LA Biplane Vol: 66.8 ml 36.99 ml/m  AORTIC VALVE LVOT Vmax:   59.75 cm/s LVOT Vmean:  43.100 cm/s LVOT VTI:    0.090 m  AORTA Ao Root diam: 2.60 cm TRICUSPID VALVE TR Peak grad:   39.9 mmHg TR Vmax:        316.00 cm/s  SHUNTS Systemic VTI:  0.09 m Systemic Diam: 1.80 cm Buford Dresser MD Electronically signed by Buford Dresser MD Signature Date/Time: 11/27/2019/10:28:38 AM    Final       Medications:     Current Medications: . aspirin EC  81 mg Oral Daily  . enoxaparin (LOVENOX) injection  40 mg Subcutaneous QHS  . gabapentin  600 mg Oral BID  . HYDROcodone-acetaminophen  1 tablet Oral QID  . insulin aspart  0-15 Units Subcutaneous TID WC  . metoprolol tartrate  12.5 mg Oral BID  .  pravastatin  10 mg Oral q1800  . sodium chloride flush  10-40 mL Intracatheter Q12H  . sodium chloride flush  3 mL  Intravenous Once  . sodium chloride flush  3 mL Intravenous Q12H  . venlafaxine XR  150 mg Oral Daily     Infusions: . sodium chloride    . amiodarone 60 mg/hr (11/27/19 1110)   Followed by  . amiodarone    . furosemide (LASIX) infusion    . magnesium sulfate bolus IVPB 2 g (11/27/19 1113)  . potassium chloride       Assessment/Plan   1. New Cardiomyopathy/ Systolic Heart Failure - Prior Echo 09/2018: LVEF 60-65%. RV normal  - Echo this admit: LVEF severely reduced, 20-25%. New anterior, septal, and lateral WMA. RV ok  - Had fairly recent [facility] 09/2018 that showed no significant coronary disease. There was minimal systolic bridging of 5 to 10% in the mid LAD. The left circumflex vessel was normal. The RCA is a dominant vessel with a superior takeoff and was normal. HS troponin this admit 32>>52>>67  - with new drop in LVEF w/ RWMAs and VT, repeat LHC warranted - also ? Post viral CM/ myocarditis (tested + for COVID in 08/2018) - check CRP, ESR and plan cMRI  - ? tachy mediated CM  - ? High burden RV pacing % - will interrogate PPM  - continue IV Lasix for diuresis - needs repeat R/LHC  - initiate GDMT when BP allows (would change metoprolol tartrate to succinate or carvedilol)   2. WCT - rates in the 160s-170s, now NSR after IV amio bolus  - continue amio gtt at 60 mg/hr  - interrogate PPM - will ask EP to evaluate (followed by Dr. Lovena Le) - already has PPM for CHB. With low EF/ VT, may need eventual upgrade to ICD - keep K >4.0 and Mg >2.0  - needs R/LHC and cMRI   3. AKI - baseline SCr 0.8-1.0 - 1.4 on admit, down to 1.2 today - monitor w/ diuresis   4. Hypokalemia/ Hypomagnesemia  - K 3.2, Mg 1.7  - IV K and Mg ordered  - keep K > 4.0 and Mg >2.0  given WCT - monitor on diuretics     Length of Stay: 1  Brittainy Simmons, PA-C  11/27/2019, 11:58 AM  Advanced Heart Failure Team Pager [phone] (M-F; [place])  Please contact [facility] for night-coverage  after hours (4p -7a ) and weekends on [email or web address]  Patient seen with PA, agree with the above note.   Patient was admitted with a couple weeks of increasing dyspnea as well as palpitations/rapid HR.  In 10/20, she was admitted with complete heart block and had MDT PPM placed. Cardiac cath in 5/20 had shown no significant CAD, and echo in 5/20 showed EF 60-65%.  No definite etiology for CHB was found.  She did ok after that until 4/21 when she got COVID-19.  She was not admitted.  She improved from this, but then over the last several weeks developed progressive dyspnea along with episodes of chest pressure and palpitations so came to the ER.  CXR showed CHF, BNP was elevated.  HS-TnI was mildly elevated with no trend.  Echo was done, showing EF newly decreased to 20-25% with septal-lateral dyssynchrony and septal severe hypokinesis, normal RV, moderate TR, moderate MR.  She got Lasix 40 mg IV in the ER with good response.   In the past, she has been noted to have short runs  of VT (by device interrogation).  Today in the ER, she had a run of WCT in the 160s-170s that appears to have been VT (AV dissociation).  However, she also has runs of WCT with rate around 150 that may be atrial tracking by PPM (?atrial tachycardia or fluttter).  Tachy episodes correspond to palpitations/chest discomfort.  Of note, at baseline she appears to be > 90% RV paced.   General: NAD Neck: JVP 12-14 cm, no thyromegaly or thyroid nodule.  Lungs: Mild crackles at bases. CV: Nondisplaced PMI.  Heart regular S1/S2, no S3/S4, no murmur.  1+ ankle edema.  No carotid bruit.  Normal pedal pulses.  Abdomen: Soft, nontender, no hepatosplenomegaly, no distention.  Skin: Intact without lesions or rashes.  Neurologic: Alert and oriented x 3.  Psych: Normal affect. Extremities: No clubbing or cyanosis.  HEENT: Normal.   1. Acute systolic CHF: Echo this admission with EF newly decreased to 20-25% with septal-lateral dyssynchrony and  septal severe hypokinesis, normal RV, moderate TR, moderate MR. Echo in 5/20 prior to PPM placement showed EF 60-65%.  Possible causes of cardiomyopathy include chronic RV pacing, ?CAD (has RFs), ?viral myocarditis (relatively recent COVID-19 infection).  No ETOH/drugs.  She has had increased dyspnea/palpitations x several weeks.  On exam, she is volume overloaded and CXR suggests pulmonary edema.  - Lasix 8 mg/hr ordered by primary service, agree with this. Replace K and Mg.  - No BP room at this time to start goal directed medical therapy, will reassess.  - Will ask EP to see to help with rhythm and also to consider CRT upgrade.  - Will plan RHC/LHC when more diuresed and arrhythmias controlled (?Wednesday).  Need to rule out coronary disease (has DM, HTN) as cause of cardiomyopathy. - If cath is negative, her device is MRI compatible, can do cardiac MRI to look for evidence of myocarditis.   2. Wide complex tachycardia: Patient has been noted to have runs of VT on device interrogation in the past.  In the ER, she has had periodic runs of WCT, ?2 types => Rate around 150 with similar morphology to sinus rhythm/RV pacing, and rate 160s-170s with AV dissociation.  The former may be AT or AFL with RV tracking, the later is likely VT.  These episodes correspond to her chest pain/palpitations.   - Replace K and Mg aggressively.  - She has been started on amiodarone, will bolus now given prolonged run and keep at 60 mg/hr for now.  - Will ask EP to assess.  3. H/o CHB: Cause is uncertain.  Cath in 5/20 prior to PPM showed no significant disease.   Should go to CCU with frequent arrhythmias.   Loralie Champagne 11/27/2019 3:40 PM

## 2019-11-27 NOTE — ED Notes (Signed)
SDU Breakfast Ordered 

## 2019-11-27 NOTE — Progress Notes (Addendum)
Paged by patient's RN for hypotension with manual BP of 52/39.   Went to bedside to evaluate patient who's only current acute complaint is nausea. RN states symptom began after hypotension. She denies any chest pain or palpitations. She endorses improvements in her SOB.   On examination, patient appears uncomfortable due to nausea. She is perfusing all extremities. By this time, patient's BP had improved to 92/74 without intervention yet.   Assessment/Plan:  Concerned her transient hypotension may have been secondary to Amiodarone gtt, currently running at 60 mg/hr. Her HR is currently stable at 72 without evidence of VT. If hypotension reoccurs, would consider decreasing Amiodarone to 30 mg/hr. Other hypotension causing medications including her home Norco have been held.   Cardiology fellow also contacted by RN and will evaluate.   Dr. Jose Persia Internal Medicine PGY-2  Pager: [phone] After 5pm on weekdays and 1pm on weekends: On Call pager [phone]  11/27/2019, 11:02 PM

## 2019-11-27 NOTE — ED Notes (Signed)
Patient states she feels like she needs urinate but cannot  Will bladder scan and proceed with protocol

## 2019-11-27 NOTE — ED Notes (Addendum)
Pt. C/o of experiencing SOB, vital stable, Nurse elevated HOB and will continue to monitor.

## 2019-11-27 NOTE — ED Provider Notes (Signed)
8:06 AM I was called to bedside as the patient had short runs of ventricular tachycardia. On exam the patient is awake and alert, notes that she has some mild discomfort.  On monitor the patient has intermittent A. fib.  Reviewing patient's echocardiogram, and process, on review there are periods of fibrillation, though at the time of evaluation patient had appropriate contractility.  I discussed her case with her internal medicine admitting team, page is out to cardiology for additional evaluation.   Carmin Muskrat, MD 11/27/19 [phone]

## 2019-11-27 NOTE — ED Notes (Signed)
Difficulty accessing IV IV team consulted

## 2019-11-27 NOTE — Progress Notes (Addendum)
Cardiology Progress Note  Lisa Poole was seen by Drs. Aundra Dubin and Elwood earlier today in the ED for ventricular tachycardia and acute decompensated heart failure. Echo showed an LVEF of 20-25%, RV relatively preserved without evidence of acute strain, though RVSP 48 and there is mod-severe TR. She was given Lasix IV in the ED and initiated on amiodarone due to recurrent VT.   She arrived to the ICU within the last hour and on arrival was reportedly persistently hypotensive with pressures from 88E-28M systolic. Since starting amiodarone in the ED her blood pressure has been significantly lower with intermittent hypotension. I was paged by her RN to evaluate.  She reports that her dyspnea has improved, but does persist. Main complaint at this time is nausea for the last hour. RN reports that she had to be cathed in the ED for urinary retention with 900 mL immediately drained. Urine output has been minimal since then. She is currently on amiodarone 60mg /hr with lasix ggt scheduled to start as well.   On exam BP is now up to 80-90s/70s with notably low pulse pressure. Has maintained NSR since arrival to ICU without any significant ectopy. She is alert, mildly nauseated but in no significant distress. Laying comfortably at about 30 degrees with JVP to the mandible, only trace lower extremity edema. Extremities are cool and she is marginally perfused. Cr is now up to 1.85 from 1.2.   Impression: VT burden while in the ED is a little unclear but she is now maintaining sinus rhythm and adequately suppressed. She also does not seem to be tolerating the amiodarone well from a hemodynamic standpoint and we can try de-escalating. Perfusion is marginal and pulse pressure is narrow, overall concerning for developing CS - given improvement in dyspnea with diuresis and maintenance of SR now, this may be related to the amiodarone. No fever or leukocytosis to suggest sepsis, but will monitor closely. Her AKI is likely  primarily post-obstructive and she certainly has a bladder stretch injury at this point. Still hypervolemic and will require additional diuresis, but would like to obtain more reliable hemodynamic monitoring (cuff pressure has been variable) and place foley first.   Recommendations: - decreased amiodarone to 30mg /hr - Hold beta blocker for now - RT to place arterial line - foley catheter placement as above  - check lactate and CMP - Will hold off on inotrope/pressor for now - Pending above interventions, will plan to resume IV Lasix - will start with 80mg  and add infusion if needed.   Addendum: poor left radial arterial flow, initial attempts at arterial line placement unsuccessful. Foley catheter placed and Lasix 80mg  IV given with minimal urine output. BP still somewhat variable but overall persistently low-normal with narrow pulse pressure. Still nauseated. Lactate came back at 5, Cr still trending up. Still in sinus rhythm without any VT. Will cautiously start dopamine - other inotrope options are limited due to BP and VT. Repeat Lasix bolus for total 160mg  and start infusion. Will hold off on arterial line placement for now.

## 2019-11-27 NOTE — ED Notes (Signed)
Attending Team  at bedside

## 2019-11-27 NOTE — Progress Notes (Signed)
Echocardiogram 2D Echocardiogram has been performed.  Lisa Poole 11/27/2019, 8:51 AM   Dr Harrell Gave notified of stat

## 2019-11-27 NOTE — ED Notes (Addendum)
Cardiology at bedside 150mg  bolus amiodarone given per verbal order. Continuous Rate dose changed to 60mg  per verbal order from Cardiologist

## 2019-11-28 ENCOUNTER — Inpatient Hospital Stay: Payer: Self-pay

## 2019-11-28 DIAGNOSIS — N179 Acute kidney failure, unspecified: Secondary | ICD-10-CM | POA: Diagnosis not present

## 2019-11-28 DIAGNOSIS — R0600 Dyspnea, unspecified: Secondary | ICD-10-CM

## 2019-11-28 DIAGNOSIS — I5043 Acute on chronic combined systolic (congestive) and diastolic (congestive) heart failure: Secondary | ICD-10-CM

## 2019-11-28 LAB — COMPREHENSIVE METABOLIC PANEL
ALT: 26 U/L (ref 0–44)
ALT: 50 U/L — ABNORMAL HIGH (ref 0–44)
AST: 55 U/L — ABNORMAL HIGH (ref 15–41)
AST: 103 U/L — ABNORMAL HIGH (ref 15–41)
Albumin: 3.2 g/dL — ABNORMAL LOW (ref 3.5–5.0)
Albumin: 3.3 g/dL — ABNORMAL LOW (ref 3.5–5.0)
Alkaline Phosphatase: 83 U/L (ref 38–126)
Alkaline Phosphatase: 85 U/L (ref 38–126)
Anion gap: 17 — ABNORMAL HIGH (ref 5–15)
Anion gap: 17 — ABNORMAL HIGH (ref 5–15)
BUN: 26 mg/dL — ABNORMAL HIGH (ref 6–20)
BUN: 28 mg/dL — ABNORMAL HIGH (ref 6–20)
CO2: 24 mmol/L (ref 22–32)
CO2: 26 mmol/L (ref 22–32)
Calcium: 9.1 mg/dL (ref 8.9–10.3)
Calcium: 9.3 mg/dL (ref 8.9–10.3)
Chloride: 96 mmol/L — ABNORMAL LOW (ref 98–111)
Chloride: 95 mmol/L — ABNORMAL LOW (ref 98–111)
Creatinine, Ser: 2.26 mg/dL — ABNORMAL HIGH (ref 0.44–1.00)
Creatinine, Ser: 2.16 mg/dL — ABNORMAL HIGH (ref 0.44–1.00)
GFR calc Af Amer: 27 mL/min — ABNORMAL LOW (ref >60)
GFR calc Af Amer: 29 mL/min — ABNORMAL LOW (ref >60)
GFR calc non Af Amer: 24 mL/min — ABNORMAL LOW (ref >60)
GFR calc non Af Amer: 25 mL/min — ABNORMAL LOW (ref >60)
Glucose, Bld: 128 mg/dL — ABNORMAL HIGH (ref 70–99)
Glucose, Bld: 139 mg/dL — ABNORMAL HIGH (ref 70–99)
Potassium: 4.6 mmol/L (ref 3.5–5.1)
Potassium: 4.6 mmol/L (ref 3.5–5.1)
Sodium: 137 mmol/L (ref 135–145)
Sodium: 138 mmol/L (ref 135–145)
Total Bilirubin: 2.8 mg/dL — ABNORMAL HIGH (ref 0.3–1.2)
Total Bilirubin: 3.4 mg/dL — ABNORMAL HIGH (ref 0.3–1.2)
Total Protein: 6.5 g/dL (ref 6.5–8.1)
Total Protein: 6.6 g/dL (ref 6.5–8.1)

## 2019-11-28 LAB — LACTIC ACID, PLASMA
Lactic Acid, Venous: 5 mmol/L (ref 0.5–1.9)
Lactic Acid, Venous: 4 mmol/L (ref 0.5–1.9)
Lactic Acid, Venous: 2.5 mmol/L (ref 0.5–1.9)
Lactic Acid, Venous: 2.1 mmol/L (ref 0.5–1.9)
Lactic Acid, Venous: 2.2 mmol/L (ref 0.5–1.9)

## 2019-11-28 LAB — COOXEMETRY PANEL
Carboxyhemoglobin: 0.9 % (ref 0.5–1.5)
Carboxyhemoglobin: 1.3 % (ref 0.5–1.5)
Carboxyhemoglobin: 1.1 % (ref 0.5–1.5)
Methemoglobin: 0.6 % (ref 0.0–1.5)
Methemoglobin: 1 % (ref 0.0–1.5)
Methemoglobin: 0.6 % (ref 0.0–1.5)
O2 Saturation: 64.3 %
O2 Saturation: 52.9 %
O2 Saturation: 63.8 %
Total hemoglobin: 14 g/dL (ref 12.0–16.0)
Total hemoglobin: 13.4 g/dL (ref 12.0–16.0)
Total hemoglobin: 12.5 g/dL (ref 12.0–16.0)

## 2019-11-28 LAB — GLUCOSE, CAPILLARY
Glucose-Capillary: 146 mg/dL — ABNORMAL HIGH (ref 70–99)
Glucose-Capillary: 270 mg/dL — ABNORMAL HIGH (ref 70–99)
Glucose-Capillary: 156 mg/dL — ABNORMAL HIGH (ref 70–99)
Glucose-Capillary: 149 mg/dL — ABNORMAL HIGH (ref 70–99)

## 2019-11-28 LAB — CBC
HCT: 48.3 % — ABNORMAL HIGH (ref 36.0–46.0)
Hemoglobin: 14.8 g/dL (ref 12.0–15.0)
MCH: 26.6 pg (ref 26.0–34.0)
MCHC: 30.6 g/dL (ref 30.0–36.0)
MCV: 86.9 fL (ref 80.0–100.0)
Platelets: 194 10*3/uL (ref 150–400)
RBC: 5.56 MIL/uL — ABNORMAL HIGH (ref 3.87–5.11)
RDW: 15.7 % — ABNORMAL HIGH (ref 11.5–15.5)
WBC: 10.5 10*3/uL (ref 4.0–10.5)
nRBC: 0.2 % (ref 0.0–0.2)

## 2019-11-28 LAB — BASIC METABOLIC PANEL
Anion gap: 12 (ref 5–15)
Anion gap: 9 (ref 5–15)
BUN: 34 mg/dL — ABNORMAL HIGH (ref 6–20)
BUN: 37 mg/dL — ABNORMAL HIGH (ref 6–20)
CO2: 29 mmol/L (ref 22–32)
CO2: 30 mmol/L (ref 22–32)
Calcium: 8.8 mg/dL — ABNORMAL LOW (ref 8.9–10.3)
Calcium: 8.4 mg/dL — ABNORMAL LOW (ref 8.9–10.3)
Chloride: 96 mmol/L — ABNORMAL LOW (ref 98–111)
Chloride: 98 mmol/L (ref 98–111)
Creatinine, Ser: 2.1 mg/dL — ABNORMAL HIGH (ref 0.44–1.00)
Creatinine, Ser: 1.95 mg/dL — ABNORMAL HIGH (ref 0.44–1.00)
GFR calc Af Amer: 30 mL/min — ABNORMAL LOW (ref >60)
GFR calc Af Amer: 33 mL/min — ABNORMAL LOW (ref >60)
GFR calc non Af Amer: 26 mL/min — ABNORMAL LOW (ref >60)
GFR calc non Af Amer: 28 mL/min — ABNORMAL LOW (ref >60)
Glucose, Bld: 206 mg/dL — ABNORMAL HIGH (ref 70–99)
Glucose, Bld: 199 mg/dL — ABNORMAL HIGH (ref 70–99)
Potassium: 3.5 mmol/L (ref 3.5–5.1)
Potassium: 3.6 mmol/L (ref 3.5–5.1)
Sodium: 137 mmol/L (ref 135–145)
Sodium: 137 mmol/L (ref 135–145)

## 2019-11-28 LAB — PHOSPHORUS: Phosphorus: 4.4 mg/dL (ref 2.5–4.6)

## 2019-11-28 LAB — HEMOGLOBIN A1C
Hgb A1c MFr Bld: 7.2 % — ABNORMAL HIGH (ref 4.8–5.6)
Mean Plasma Glucose: 159.94 mg/dL

## 2019-11-28 LAB — MAGNESIUM
Magnesium: 1.9 mg/dL (ref 1.7–2.4)
Magnesium: 2.2 mg/dL (ref 1.7–2.4)

## 2019-11-28 LAB — MRSA PCR SCREENING: MRSA by PCR: NEGATIVE

## 2019-11-28 MED ORDER — FUROSEMIDE 10 MG/ML IJ SOLN
INTRAMUSCULAR | Status: AC
  Administered 2019-11-28: 80 mg via INTRAVENOUS
  Filled 2019-11-28: qty 4

## 2019-11-28 MED ORDER — FUROSEMIDE 10 MG/ML IJ SOLN
40.0000 mg | Freq: Once | INTRAMUSCULAR | Status: AC
  Administered 2019-11-28: 40 mg via INTRAVENOUS
  Filled 2019-11-28: qty 4

## 2019-11-28 MED ORDER — DOPAMINE-DEXTROSE 3.2-5 MG/ML-% IV SOLN
5.0000 ug/kg/min | INTRAVENOUS | Status: DC
  Administered 2019-11-28: 5 ug/kg/min via INTRAVENOUS
  Filled 2019-11-28: qty 250

## 2019-11-28 MED ORDER — AMIODARONE HCL 200 MG PO TABS
400.0000 mg | ORAL_TABLET | Freq: Three times a day (TID) | ORAL | Status: DC
  Administered 2019-11-28 (×3): 400 mg via ORAL
  Filled 2019-11-28 (×3): qty 2

## 2019-11-28 MED ORDER — LIDOCAINE HCL (PF) 1 % IJ SOLN
INTRAMUSCULAR | Status: AC
  Administered 2019-11-27: 5 mL via INTRADERMAL
  Filled 2019-11-28: qty 5

## 2019-11-28 MED ORDER — MAGNESIUM SULFATE 2 GM/50ML IV SOLN
2.0000 g | Freq: Once | INTRAVENOUS | Status: AC
  Administered 2019-11-28: 2 g via INTRAVENOUS
  Filled 2019-11-28: qty 50

## 2019-11-28 MED ORDER — NOREPINEPHRINE 4 MG/250ML-% IV SOLN
0.0000 ug/min | INTRAVENOUS | Status: DC
  Administered 2019-11-28: 2 ug/min via INTRAVENOUS
  Filled 2019-11-28 (×2): qty 250

## 2019-11-28 MED ORDER — LIDOCAINE HCL 1 % IJ SOLN: 5.0000 mL | Freq: Once | INTRAMUSCULAR | Status: AC

## 2019-11-28 MED ORDER — POTASSIUM CHLORIDE CRYS ER 20 MEQ PO TBCR
40.0000 meq | EXTENDED_RELEASE_TABLET | Freq: Once | ORAL | Status: AC
  Administered 2019-11-28: 40 meq via ORAL
  Filled 2019-11-28: qty 2

## 2019-11-28 MED ORDER — POTASSIUM CHLORIDE 10 MEQ/50ML IV SOLN
10.0000 meq | INTRAVENOUS | Status: AC
  Administered 2019-11-28 – 2019-11-29 (×3): 10 meq via INTRAVENOUS
  Filled 2019-11-28 (×3): qty 50

## 2019-11-28 MED ORDER — FUROSEMIDE 10 MG/ML IJ SOLN
80.0000 mg | Freq: Once | INTRAMUSCULAR | Status: AC
  Filled 2019-11-28: qty 8

## 2019-11-28 MED ORDER — FUROSEMIDE 10 MG/ML IJ SOLN
15.0000 mg/h | INTRAVENOUS | Status: DC
  Administered 2019-11-28 – 2019-11-29 (×2): 10 mg/h via INTRAVENOUS
  Administered 2019-11-30: 12 mg/h via INTRAVENOUS
  Administered 2019-12-01 – 2019-12-03 (×3): 15 mg/h via INTRAVENOUS
  Filled 2019-11-28 (×7): qty 25

## 2019-11-28 MED ORDER — FUROSEMIDE 10 MG/ML IJ SOLN
80.0000 mg | Freq: Once | INTRAMUSCULAR | Status: AC
  Administered 2019-11-28: 80 mg via INTRAVENOUS
  Filled 2019-11-28: qty 8

## 2019-11-28 MED ORDER — MILRINONE LACTATE IN DEXTROSE 20-5 MG/100ML-% IV SOLN
0.1250 ug/kg/min | INTRAVENOUS | Status: DC
  Administered 2019-11-28 – 2019-11-29 (×2): 0.125 ug/kg/min via INTRAVENOUS
  Filled 2019-11-28 (×3): qty 100

## 2019-11-28 NOTE — Progress Notes (Signed)
[facility] Progress Note Patient Name: Lisa Poole DOB: Nov 04, 1965 MRN: [id number]   Date of Service  11/28/2019  HPI/Events of Note  Hypokalemia - K+ = 3.6 and Creatinine = 1.95.  eICU Interventions  Will replace K+.      Intervention Category Major Interventions: Electrolyte abnormality - evaluation and management  [name] 11/28/2019, 11:29 PM

## 2019-11-28 NOTE — Progress Notes (Signed)
Peripherally Inserted Central Catheter Placement  The IV Nurse has discussed with the patient and/or persons authorized to consent for the patient, the purpose of this procedure and the potential benefits and risks involved with this procedure.  The benefits include less needle sticks, lab draws from the catheter, and the patient may be discharged home with the catheter. Risks include, but not limited to, infection, bleeding, blood clot (thrombus formation), and puncture of an artery; nerve damage and irregular heartbeat and possibility to perform a PICC exchange if needed/ordered by physician.  Alternatives to this procedure were also discussed.  Bard Power PICC patient education guide, fact sheet on infection prevention and patient information card has been provided to patient /or left at bedside.    PICC Placement Documentation  PICC Double Lumen 66/06/00 PICC Right Basilic 37 cm 0 cm (Active)  Indication for Insertion or Continuance of Line Prolonged intravenous therapies 11/28/19 1212  Exposed Catheter (cm) 0 cm 11/28/19 1212  Site Assessment Clean;Dry;Intact 11/28/19 1212  Lumen #1 Status Flushed;Blood return noted;Saline locked 11/28/19 1212  Lumen #2 Status Flushed;Blood return noted;Saline locked 11/28/19 1212  Dressing Type Transparent 11/28/19 1212  Dressing Status Clean;Dry;Intact;Antimicrobial disc in place 11/28/19 1212  Dressing Change Due 12/05/19 11/28/19 1212       Scotty Court 11/28/2019, 12:18 PM

## 2019-11-28 NOTE — Progress Notes (Signed)
°  Date: 11/28/2019  Patient name: Lisa Poole  Medical record number: [id number]  Date of birth: 1965-07-09        I have seen and evaluated this patient and I have discussed the plan of care with the house staff. Please see Dr. Jackson Latino note for complete details. I concur with his findings and plan.  Patient will be on PCCM/ICU team going forward given acuity of care needed.  We will take over care once she is transferred out of the ICU and/or no longer requiring pressor support.   Sid Falcon, MD 11/28/2019, 4:56 PM

## 2019-11-28 NOTE — Progress Notes (Signed)
Pt. Currently on room air, no distressed noted, does not wear at home.

## 2019-11-28 NOTE — Progress Notes (Addendum)
Advanced Heart Failure Rounding Note  PCP-Cardiologist: No primary care provider on file.   Subjective:    Developed CGS overnight w/ hypotension and narrow pulse pressure, w/ subsequent AKI, SCr 1.18>>1.85>>2.26. Lactic acid increased from 2.1>>5.0.   Diuresis subsequently limited. Unable to start lasix gtt given hypotension.  Started on dopamine 5 mcg. BP improved, allowing 2 doses of IV Lasix, 80 mg, given at 0130 and 0330.  SCr trending down slightly, 2.26>>2.16. -1.6L in UOP charted.   Remains on amiodarone gtt at 30 mg/hr. Rhythm improved. Continues w/ brief runs of NSVT and PVCs but no sustained tachyrhythmias.   Feels tired. States she had a "rough night". Currently no resting dyspnea.    Objective:   Weight Range: 90.1 kg Body mass index is 38.79 kg/m.   Vital Signs:   Temp:  [97.6 F (36.4 C)] 97.6 F (36.4 C) (07/19 2348) Pulse Rate:  [25-182] 89 (07/20 0700) Resp:  [8-32] 18 (07/20 0700) BP: (52-140)/(19-108) 117/98 (07/20 0700) SpO2:  [76 %-100 %] 99 % (07/20 0700) Weight:  [90.1 kg] 90.1 kg (07/20 0700)    Weight change: Filed Weights   11/26/19 1850 11/28/19 0700  Weight: 83.9 kg 90.1 kg    Intake/Output:   Intake/Output Summary (Last 24 hours) at 11/28/2019 0717 Last data filed at 11/28/2019 0600 Gross per 24 hour  Intake 387.32 ml  Output 1620 ml  Net -1232.68 ml      Physical Exam    General:  fatigue appearing, moderately obese. No resp difficulty HEENT: Normal Neck: Supple. Thick neck, JVP assessment difficult . Carotids 2+ bilat; no bruits. No lymphadenopathy or thyromegaly appreciated. Cor: PMI nondisplaced. Regular rate & rhythm, occasional PVCs. No rubs, gallops or murmurs. Lungs: Clear Abdomen: Soft, nontender, nondistended. No hepatosplenomegaly. No bruits or masses. Good bowel sounds. Extremities: No cyanosis, clubbing, rash, edema Neuro: Alert & orientedx3, cranial nerves grossly intact. moves all 4 extremities w/o  difficulty. Affect pleasant   Telemetry   NSR currently, NSR 90s w/ occasional brief runs of NSVT and PVCs. No sustained tachyrhythmias overnight.   EKG    Atrial-sensed ventricular-paced rhythm with prolonged AV conduction with Premature atrial complexes with abberant conduction, 83 bpm   Labs    CBC Recent Labs    11/26/19 1908 11/28/19 0612  WBC 8.3 10.5  HGB 13.8 14.8  HCT 46.1* 48.3*  MCV 90.2 86.9  PLT 311 161   Basic Metabolic Panel Recent Labs    11/27/19 1927 11/27/19 1927 11/28/19 0215 11/28/19 0535  NA 139   < > 137 138  K 4.0   < > 4.6 4.6  CL 98   < > 96* 95*  CO2 27   < > 24 26  GLUCOSE 134*   < > 128* 139*  BUN 24*   < > 26* 28*  CREATININE 1.85*   < > 2.26* 2.16*  CALCIUM 8.8*   < > 9.1 9.3  MG 2.3  --   --  1.9  PHOS 3.9  --   --  4.4   < > = values in this interval not displayed.   Liver Function Tests Recent Labs    11/28/19 0215 11/28/19 0535  AST 55* 103*  ALT 26 50*  ALKPHOS 83 85  BILITOT 2.8* 3.4*  PROT 6.5 6.6  ALBUMIN 3.2* 3.3*   No results for input(s): LIPASE, AMYLASE in the last 72 hours. Cardiac Enzymes No results for input(s): CKTOTAL, CKMB, CKMBINDEX, TROPONINI in the last 72  hours.  BNP: BNP (last 3 results) Recent Labs    11/26/19 2011  BNP 1,258.1*    ProBNP (last 3 results) No results for input(s): PROBNP in the last 8760 hours.   D-Dimer No results for input(s): DDIMER in the last 72 hours. Hemoglobin A1C Recent Labs    11/27/19 0402  HGBA1C 7.1*   Fasting Lipid Panel No results for input(s): CHOL, HDL, LDLCALC, TRIG, CHOLHDL, LDLDIRECT in the last 72 hours. Thyroid Function Tests No results for input(s): TSH, T4TOTAL, T3FREE, THYROIDAB in the last 72 hours.  Invalid input(s): FREET3  Other results:   Imaging    ECHOCARDIOGRAM COMPLETE  Result Date: 11/27/2019    ECHOCARDIOGRAM REPORT   Patient Name:   Lisa Poole Date of Exam: 11/27/2019 Medical Rec #:  [id number]      Height:        60.0 in Accession #:    [id number]     Weight:       185.0 lb Date of Birth:  10/18/1965       BSA:          1.806 m Patient Age:    54 years       BP:           131/107 mmHg Patient Gender: F              HR:           120 bpm. Exam Location:  Inpatient Procedure: 2D Echo, Color Doppler, Cardiac Doppler and Intracardiac            Opacification Agent STAT ECHO Indications:    I50.9* Heart failure (unspecified)  History:        Patient has prior history of Echocardiogram examinations, most                 recent 09/28/2018. Pacemaker; Risk Factors:Hypertension, Diabetes                 and Dyslipidemia. COVID+ on 08/16/19.  Sonographer:    Raquel Sarna Senior RDCS Referring Phys: [phone] [facility] PFEIFFER  Sonographer Comments: Definity used to assess for thrombus IMPRESSIONS  1. Left ventricular ejection fraction, by estimation, is 20 to 25%. The left ventricle has severely decreased function. The left ventricle demonstrates regional wall motion abnormalities (see scoring diagram/findings for description). The left ventricular internal cavity size was mildly dilated. Left ventricular diastolic parameters are indeterminate.  2. Right ventricular systolic function is normal. The right ventricular size is mildly enlarged. There is moderately elevated pulmonary artery systolic pressure. The estimated right ventricular systolic pressure is 70.6 mmHg.  3. Left atrial size was mildly dilated.  4. Right atrial size was moderately dilated.  5. The mitral valve is normal in structure. Moderate mitral valve regurgitation.  6. Tricuspid valve regurgitation is moderate to severe.  7. The aortic valve is grossly normal. Aortic valve regurgitation is mild.  8. The inferior vena cava is normal in size with <50% respiratory variability, suggesting right atrial pressure of 8 mmHg. Comparison(s): Changes from prior study are noted. Conclusion(s)/Recommendation(s): EF severely reduced compared to prior studies. Wall motion abnormalities, most  prominent in anterior, septal, and lateral walls. Moderate to severe TR with elevated RVSP. Moderate MR. LV thrombus excluded by definity contrast. Brief VT during the exam. FINDINGS  Left Ventricle: Left ventricular ejection fraction, by estimation, is 20 to 25%. The left ventricle has severely decreased function. The left ventricle demonstrates regional wall motion abnormalities. Definity contrast agent was given  IV to delineate the left ventricular endocardial borders. The left ventricular internal cavity size was mildly dilated. There is no left ventricular hypertrophy. Left ventricular diastolic parameters are indeterminate.  LV Wall Scoring: The entire anterior wall, antero-lateral wall, entire anterior septum, and entire apex are akinetic. The inferior wall, posterior wall, mid inferoseptal segment, and basal inferoseptal segment are hypokinetic. Right Ventricle: The right ventricular size is mildly enlarged. Right vetricular wall thickness was not assessed. Right ventricular systolic function is normal. There is moderately elevated pulmonary artery systolic pressure. The tricuspid regurgitant velocity is 3.16 m/s, and with an assumed right atrial pressure of 8 mmHg, the estimated right ventricular systolic pressure is 37.1 mmHg. Left Atrium: Left atrial size was mildly dilated. Right Atrium: Right atrial size was moderately dilated. Pericardium: There is no evidence of pericardial effusion. Mitral Valve: The mitral valve is normal in structure. Moderate mitral valve regurgitation. Tricuspid Valve: The tricuspid valve is normal in structure. Tricuspid valve regurgitation is moderate to severe. Aortic Valve: The aortic valve is grossly normal.. There is mild thickening and mild calcification of the aortic valve. Aortic valve regurgitation is mild. There is mild thickening of the aortic valve. There is mild calcification of the aortic valve. Pulmonic Valve: The pulmonic valve was not well visualized. Pulmonic  valve regurgitation is not visualized. No evidence of pulmonic stenosis. Aorta: The aortic root and ascending aorta are structurally normal, with no evidence of dilitation. Venous: The inferior vena cava is normal in size with less than 50% respiratory variability, suggesting right atrial pressure of 8 mmHg. IAS/Shunts: The atrial septum is grossly normal. Additional Comments: A pacer wire is visualized in the right atrium and right ventricle.  LEFT VENTRICLE PLAX 2D LVIDd:         5.80 cm LVIDs:         5.00 cm LV PW:         0.90 cm LV IVS:        0.70 cm LVOT diam:     1.80 cm LV SV:         23 LV SV Index:   13 LVOT Area:     2.54 cm  RIGHT VENTRICLE RV S prime:     12.10 cm/s TAPSE (M-mode): 2.5 cm LEFT ATRIUM             Index       RIGHT ATRIUM           Index LA diam:        4.40 cm 2.44 cm/m  RA Area:     22.10 cm LA Vol (A2C):   78.4 ml 43.42 ml/m RA Volume:   69.20 ml  38.32 ml/m LA Vol (A4C):   56.8 ml 31.45 ml/m LA Biplane Vol: 66.8 ml 36.99 ml/m  AORTIC VALVE LVOT Vmax:   59.75 cm/s LVOT Vmean:  43.100 cm/s LVOT VTI:    0.090 m  AORTA Ao Root diam: 2.60 cm TRICUSPID VALVE TR Peak grad:   39.9 mmHg TR Vmax:        316.00 cm/s  SHUNTS Systemic VTI:  0.09 m Systemic Diam: 1.80 cm Buford Dresser MD Electronically signed by Buford Dresser MD Signature Date/Time: 11/27/2019/10:28:38 AM    Final       Medications:     Scheduled Medications: . aspirin EC  81 mg Oral Daily  . Chlorhexidine Gluconate Cloth  6 each Topical Daily  . enoxaparin (LOVENOX) injection  40 mg Subcutaneous QHS  . furosemide      .  gabapentin  600 mg Oral BID  . HYDROcodone-acetaminophen  1 tablet Oral QID  . insulin aspart  0-15 Units Subcutaneous TID WC  . pravastatin  10 mg Oral q1800  . sodium chloride flush  10-40 mL Intracatheter Q12H  . sodium chloride flush  3 mL Intravenous Once  . sodium chloride flush  3 mL Intravenous Q12H  . venlafaxine XR  150 mg Oral Daily     Infusions: . sodium  chloride    . sodium chloride    . amiodarone 60 mg/hr (11/28/19 0629)  . DOPamine 5 mcg/kg/min (11/28/19 0334)  . furosemide (LASIX) infusion Stopped (11/27/19 1917)     PRN Medications:  sodium chloride, Place/Maintain arterial line **AND** sodium chloride, acetaminophen, ipratropium-albuterol, sodium chloride flush, sodium chloride flush    Assessment/Plan   1. Acute systolic CHF: Echo this admission with EF newly decreased to 20-25% with septal-lateral dyssynchrony and septal severe hypokinesis, normal RV, moderate TR, moderate MR. Echo in 5/20 prior to PPM placement showed EF 60-65%.  Possible causes of cardiomyopathy include chronic RV pacing, ?CAD (has RFs), ?viral myocarditis (relatively recent COVID-19 infection).  No ETOH/drugs.  She has had increased dyspnea/palpitations x several weeks. Volume overloaded and CXR suggested pulmonary edema at time of admit. Developed CGS overnight 7/20 w/ hypotension and narrow pulse pressure, w/ subsequent AKI, SCr 1.18>>1.85>>2.26. Lactic acid increased from 2.1>>5.0. Diuresis subsequently limited. Unable to start lasix gtt given hypotension. Started on dopamine 5 mcg. BP improved allowing IV Lasix 80 mg x 2. -1.6L UOP overnight. No resting dyspnea currently.  - Plan to stop Dopamine today to reduce ectopy.  - Continue to diurese w/ IV lasix 80 mg bid. Monitor for hypotension.  - Will plan RHC/LHC when more diuresed and arrhythmias controlled (?Wednesday).  Need to rule out coronary disease (has DM, HTN) as cause of cardiomyopathy. - If cath is negative, her device is MRI compatible, can do cardiac MRI to look for evidence of myocarditis (? Viral CM post COVID-19 infection).  Also ? Infiltrative CM.  - No BP room at this time to start goal directed medical therapy, will reassess.  - Appreciate EP's assistance. My need CRT upgrade.  2. VT: Ventricular tachycardia w/ variable morphologies. EP following - stop IV amiodarone and transition to PO  today, 400 mg tid - stop dopamine - keep K >4.0 and Mg 2.0  - plan w/u per above to exclude CAD, myocarditis and infiltrative processes  - likely will need CRT upgrade.  3. H/o CHB: Cause is uncertain.  Cath in 5/20 prior to PPM showed no significant disease.  - has PPM. May need CRT upgrade.  4. AKI - SCr increased from 1.18>>2.26, 2/2 hypotension/hypoperfusion  - BP improved w/ dopamine - SCr trending down, 2.26>>2.16 - continue diuresis  - monitor BP - observe closely for further hypotension    Length of Stay: 2  Lyda Jester, PA-C  11/28/2019, 7:17 AM  Advanced Heart Failure Team Pager [phone] (M-F; 7a - 4p)  Please contact [facility] for night-coverage after hours (4p -7a ) and weekends on [email or web address]  Patient seen with PA and reviewed with EP.   She had a difficult night, BP dropped after starting amiodarone gtt and dopamine was started with increased ectopy.  Lactate elevated to 5 pre-dopamine, lower at 4 when last checked.  AKI with creatinine up to 2.26 last night, lower this morning at 2.16.  BP now stable.  She got 2 doses of IV Lasix last night and is breathing  better.  Had nausea overnight.   General: NAD Neck: JVP difficult, appears elevated, no thyromegaly or thyroid nodule.  Lungs: Clear to auscultation bilaterally with normal respiratory effort. CV: Nondisplaced PMI.  Heart regular S1/S2, no S3/S4, no murmur.  No peripheral edema.  Abdomen: Soft, nontender, no hepatosplenomegaly, no distention.  Skin: Intact without lesions or rashes.  Neurologic: Alert and oriented x 3.  Psych: Normal affect. Extremities: No clubbing or cyanosis.  HEENT: Normal.   1. Acute systolic CHF: Echo this admission with EF newly decreased to 20-25% with septal-lateral dyssynchrony and septal severe hypokinesis, normal RV, moderate TR, moderate MR. Echo in 5/20 prior to PPM placement showed EF 60-65%.  Possible causes of cardiomyopathy include chronic pacing (but she has left  bundle pacing lead per EP with QRS in 130s), ?CAD (has RFs), ?viral myocarditis (relatively recent COVID-19 infection), ?frequent ventricular ectopy (increased NSVT/PVCs over the last month).  No ETOH/drugs.  She has had increased dyspnea/palpitations x several weeks.  Exam somewhat difficult but suspect she is still volume overloaded.  Breathing better after 2 doses of IV Lasix last night.  Dopamine started with hypotension last night on IV amiodarone, lactate elevated to 5 but now trending down.  - Stop dopamine and will use low dose norepinephrine to maintain MAP for now.  - Place PICC line to follow CVP and co-ox.  - Lasix 80 mg IV x 1 at 1 pm, will then follow CVP to decide further need.    - Will plan RHC/LHC when stabilized.  Need to rule out coronary disease (has DM, HTN) as cause of cardiomyopathy. - Will need cardiac MRI (device is compatible) when more stable => look for infiltrative disease like sarcoid (we do not know why she had CHB) as well as evidence for prior myocarditis.  - She has left bundle pacing as an alternative to CRT => QRS in 130s.  She will need ICD upgrade likely, so will review with EP whether she should be upgraded as well to traditional CRT.  2. Wide complex tachycardia: VT with multiple morphologies.  Increased ventricular arrhythmias over 6 months.  ?Underlying cause.  Prior cath without coronary disease, plan to repeat.  Also consider infiltrative disease like sarcoidosis.  - Replace K and Mg aggressively.  - EP switching from IV to high dose po amiodarone.  3. H/o CHB: Cause is uncertain.  Cath in 5/20 prior to PPM showed no significant disease.  - Eventual cardiac MRI to look for infiltrative disease such as sarcoidosis.  4. AKI: In setting of hypotension overnight.  Creatinine trending down on dopamine, as above will switch to norepinephrine.   CRITICAL CARE Performed by: Loralie Champagne  Total critical care time: 35 minutes  Critical care time was exclusive of  separately billable procedures and treating other patients.  Critical care was necessary to treat or prevent imminent or life-threatening deterioration.  Critical care was time spent personally by me on the following activities: development of treatment plan with patient and/or surrogate as well as nursing, discussions with consultants, evaluation of patient's response to treatment, examination of patient, obtaining history from patient or surrogate, ordering and performing treatments and interventions, ordering and review of laboratory studies, ordering and review of radiographic studies, pulse oximetry and re-evaluation of patient's condition.  Loralie Champagne 11/28/2019 8:34 AM

## 2019-11-28 NOTE — Progress Notes (Signed)
Subjective: HD#2   Overnight: ON paged for hypotension. Systolics in the 78-46N/62X. Decreased amiodarone to 30 mg/hr, BB held. Started dopamine. IV Lasix 160 mg given. LA elevated from 2.26 to 5.0 trending down to 4.0.   Lisa Poole was seen at bedside this AM. She reports feeling better today, but had a "rough time" last night. She states that she was able to make some urine this morning. Bedside nurse stated that they did remove approximately 600 mL of urine overnight.   Objective:  Vital signs in last 24 hours: Vitals:   11/28/19 0545 11/28/19 0600 11/28/19 0615 11/28/19 0630  BP: 91/79 111/80 (!) 106/92 (!) 125/92  Pulse:    83  Resp: (!) 22 20 14 15   Temp:      TempSrc:      SpO2:    98%  Weight:      Height:       Physical Exam Constitutional:      Appearance: She is normal weight. She is ill-appearing and diaphoretic.  HENT:     Head: Normocephalic and atraumatic.  Neurological:     Mental Status: She is alert and oriented to person, place, and time.     Assessment/Plan:  Principal Problem:   Acute decompensated heart failure (HCC) Active Problems:   HTN (hypertension)   Complete heart block [facility])   Pacemaker  Lisa Poole is a very pleasant woman with medical history significant for prior Covid infection, NSTEMI, complete heart block status post pacemaker, diabetes mellitus, hypertension and hyperlipidemia who presented with progressively worsening shortness of breath here for management of acute decompensated heart failure.  New onset acute decompensated heart failure (EF 20-25% 2021) Nonischemic cardiomyopathy ?Post-COVID Cardiomyopathy Appears to be in distress this morning when she complained of chest pressure each time she experienced intermittent VT. Her lower extremities were cool to touch.  She had soft blood pressures however MAP was unremarkable. Recent echocardiogram showed EF of 20-25% with regional wall motion abnormalities compared to prior  echocardiogram in 2020 which showed an LVEF of 60 to 65%. Overnight she became hypotensive, she was started on a dopamine drip with decrease in her amiodarone infusion. She was transferred to ICU for further care while she is being stabilized.  -Additional 160 mg IV Lasix ON.  -Appreciate cardiology recommendations -Daily weights -Strict I's and O's -Keep potassium above 4 and magnesium above 2 -Supplemental oxygen to keep SPO2 above 88% -Continue low-dose Lopressor 12.5 mg twice daily to prevent rebound tachycardia - Dopamine discontinued and placed on Levo. Transferred to ICU for further care.    Intermittent ventricular tachycardia - Amiodarone infusion decreased to 30 mg/hr. Brief runs of NSVT ON with reduced dose.  - Monitor QTc closely - Replace electrolytes   NSTEMI (less likely type I) Troponin level 32>> 52>> 67. Several EKGs have been obtained and if you have them shows ST depression in the lateral leads - Serial EKGs -- Serial troponin   Acute kidney injury Cardiorenal syndrome sCr 2.16<2.26 (Baseline 0.8). Net I/O -1.2 mL  -Continue to monitor   Hypertension -Hold losartan -Resume metoprolol at low-dose 12.5 mg twice daily   Diabetes mellitus -Continue SSI -We will benefit from GLP-1 agonist or SGLT2 antagonist at discharge   Hyperlipidemia -Continue pravastatin   Peripheral neuropathy -Continue gabapentin, Norco   FEN: Low-salt diet VTE ppx: Subcutaneous Lovenox CODE STATUS: Full code  Prior to Admission Living Arrangement: Home Anticipated Discharge Location: Home Barriers to Discharge: Treatment of CHF Dispo: Anticipated discharge in  approximately 3-4 day(s).    Lisa Mercury, MD 11/28/2019, 6:51 AM Pager: [phone] Internal Medicine Teaching Service After 5pm on weekdays and 1pm on weekends: On Call pager: [phone]

## 2019-11-28 NOTE — Progress Notes (Addendum)
Electrophysiology Rounding Note  Patient Name: NADIRAH SOCORRO Date of Encounter: 11/28/2019  Electrophysiologist: Dr. Lovena Le   Subjective   Feeling about the same this am.   Attempts at diuresis were limited by hypotension and dopamine started.  Remains fatigue and somewhat diaphoretic.  No dyspnea at rest.  Inpatient Medications    Scheduled Meds: . aspirin EC  81 mg Oral Daily  . Chlorhexidine Gluconate Cloth  6 each Topical Daily  . enoxaparin (LOVENOX) injection  40 mg Subcutaneous QHS  . furosemide      . gabapentin  600 mg Oral BID  . HYDROcodone-acetaminophen  1 tablet Oral QID  . insulin aspart  0-15 Units Subcutaneous TID WC  . pravastatin  10 mg Oral q1800  . sodium chloride flush  10-40 mL Intracatheter Q12H  . sodium chloride flush  3 mL Intravenous Once  . sodium chloride flush  3 mL Intravenous Q12H  . venlafaxine XR  150 mg Oral Daily   Continuous Infusions: . sodium chloride    . sodium chloride    . amiodarone 60 mg/hr (11/28/19 0629)  . DOPamine 5 mcg/kg/min (11/28/19 0334)  . furosemide (LASIX) infusion Stopped (11/27/19 1917)   PRN Meds: sodium chloride, Place/Maintain arterial line **AND** sodium chloride, acetaminophen, ipratropium-albuterol, sodium chloride flush, sodium chloride flush   Vital Signs    Vitals:   11/28/19 0545 11/28/19 0600 11/28/19 0615 11/28/19 0630  BP: 91/79 111/80 (!) 106/92 (!) 125/92  Pulse:    83  Resp: (!) 22 20 14 15   Temp:      TempSrc:      SpO2:    98%  Weight:      Height:        Intake/Output Summary (Last 24 hours) at 11/28/2019 0654 Last data filed at 11/28/2019 0600 Gross per 24 hour  Intake 387.32 ml  Output 1120 ml  Net -732.68 ml   Filed Weights   11/26/19 1850  Weight: 83.9 kg    Physical Exam    GEN- The patient is ill appearing, alert and oriented x 3 today.   Head- normocephalic, atraumatic Eyes-  Sclera clear, conjunctiva pink Ears- hearing intact Oropharynx- clear Neck- supple,  JVP elevated Lungs- Clear to ausculation bilaterally, normal work of breathing Heart- Somewhat irregular due to frequent ectopy,  GI- soft, NT, ND, + BS Extremities- no clubbing or cyanosis.  Skin- no rash or lesion Psych- euthymic mood, full affect Neuro- strength and sensation are intact  Labs    CBC Recent Labs    11/26/19 1908 11/28/19 0612  WBC 8.3 10.5  HGB 13.8 14.8  HCT 46.1* 48.3*  MCV 90.2 86.9  PLT 311 578   Basic Metabolic Panel Recent Labs    11/27/19 1927 11/27/19 1927 11/28/19 0215 11/28/19 0535  NA 139   < > 137 138  K 4.0   < > 4.6 4.6  CL 98   < > 96* 95*  CO2 27   < > 24 26  GLUCOSE 134*   < > 128* 139*  BUN 24*   < > 26* 28*  CREATININE 1.85*   < > 2.26* 2.16*  CALCIUM 8.8*   < > 9.1 9.3  MG 2.3  --   --  1.9  PHOS 3.9  --   --  4.4   < > = values in this interval not displayed.   Liver Function Tests Recent Labs    11/28/19 0215 11/28/19 0535  AST 55* 103*  ALT 26 50*  ALKPHOS 83 85  BILITOT 2.8* 3.4*  PROT 6.5 6.6  ALBUMIN 3.2* 3.3*   No results for input(s): LIPASE, AMYLASE in the last 72 hours. Cardiac Enzymes No results for input(s): CKTOTAL, CKMB, CKMBINDEX, TROPONINI in the last 72 hours. BNP Invalid input(s): POCBNP D-Dimer No results for input(s): DDIMER in the last 72 hours. Hemoglobin A1C Recent Labs    11/27/19 0402  HGBA1C 7.1*   Fasting Lipid Panel No results for input(s): CHOL, HDL, LDLCALC, TRIG, CHOLHDL, LDLDIRECT in the last 72 hours. Thyroid Function Tests No results for input(s): TSH, T4TOTAL, T3FREE, THYROIDAB in the last 72 hours.  Invalid input(s): FREET3  Telemetry    No further VT since ~1530 yesterday. Sinus (AS-VP) with rates gradually increasing from 60s to 80-90s, ectopy is gradually increasing as well since started on dopamine (personally reviewed)  Radiology    DG Chest Portable 1 View  Result Date: 11/26/2019 CLINICAL DATA:  Shortness of breath. EXAM: PORTABLE CHEST 1 VIEW COMPARISON:   08/16/2019 FINDINGS: There are prominent interstitial lung markings bilaterally. There is scattered hazy airspace opacities. The heart size is enlarged. Aortic calcifications are noted. There is a dual chamber left-sided pacemaker in place. There may be small bilateral pleural effusions. There is no pneumothorax. No acute osseous abnormality. Calcified lymph nodes are noted. IMPRESSION: Findings concerning for developing pulmonary edema versus an atypical infectious process in the appropriate clinical setting. Electronically Signed   By: Constance Holster M.D.   On: 11/26/2019 20:15   ECHOCARDIOGRAM COMPLETE  Result Date: 11/27/2019    ECHOCARDIOGRAM REPORT   Patient Name:   [name] Date of Exam: 11/27/2019 Medical Rec #:  [id number]      Height:       60.0 in Accession #:    [id number]     Weight:       185.0 lb Date of Birth:  07-04-1965       BSA:          1.806 m Patient Age:    54 years       BP:           131/107 mmHg Patient Gender: F              HR:           120 bpm. Exam Location:  Inpatient Procedure: 2D Echo, Color Doppler, Cardiac Doppler and Intracardiac            Opacification Agent STAT ECHO Indications:    I50.9* Heart failure (unspecified)  History:        Patient has prior history of Echocardiogram examinations, most                 recent 09/28/2018. Pacemaker; Risk Factors:Hypertension, Diabetes                 and Dyslipidemia. COVID+ on 08/16/19.  Sonographer:    Raquel Sarna Senior RDCS Referring Phys: [phone] [facility]FE[facility]  Sonographer Comments: Definity used to assess for thrombus IMPRESSIONS  1. Left ventricular ejection fraction, by estimation, is 20 to 25%. The left ventricle has severely decreased function. The left ventricle demonstrates regional wall motion abnormalities (see scoring diagram/findings for description). The left ventricular internal cavity size was mildly dilated. Left ventricular diastolic parameters are indeterminate.  2. Right ventricular systolic function is  normal. The right ventricular size is mildly enlarged. There is moderately elevated pulmonary artery systolic pressure. The estimated right ventricular systolic pressure is 03.7 mmHg.  3. Left atrial size was mildly dilated.  4. Right atrial size was moderately dilated.  5. The mitral valve is normal in structure. Moderate mitral valve regurgitation.  6. Tricuspid valve regurgitation is moderate to severe.  7. The aortic valve is grossly normal. Aortic valve regurgitation is mild.  8. The inferior vena cava is normal in size with <50% respiratory variability, suggesting right atrial pressure of 8 mmHg. Comparison(s): Changes from prior study are noted. Conclusion(s)/Recommendation(s): EF severely reduced compared to prior studies. Wall motion abnormalities, most prominent in anterior, septal, and lateral walls. Moderate to severe TR with elevated RVSP. Moderate MR. LV thrombus excluded by definity contrast. Brief VT during the exam. FINDINGS  Left Ventricle: Left ventricular ejection fraction, by estimation, is 20 to 25%. The left ventricle has severely decreased function. The left ventricle demonstrates regional wall motion abnormalities. Definity contrast agent was given IV to delineate the left ventricular endocardial borders. The left ventricular internal cavity size was mildly dilated. There is no left ventricular hypertrophy. Left ventricular diastolic parameters are indeterminate.  LV Wall Scoring: The entire anterior wall, antero-lateral wall, entire anterior septum, and entire apex are akinetic. The inferior wall, posterior wall, mid inferoseptal segment, and basal inferoseptal segment are hypokinetic. Right Ventricle: The right ventricular size is mildly enlarged. Right vetricular wall thickness was not assessed. Right ventricular systolic function is normal. There is moderately elevated pulmonary artery systolic pressure. The tricuspid regurgitant velocity is 3.16 m/s, and with an assumed right atrial  pressure of 8 mmHg, the estimated right ventricular systolic pressure is 24.5 mmHg. Left Atrium: Left atrial size was mildly dilated. Right Atrium: Right atrial size was moderately dilated. Pericardium: There is no evidence of pericardial effusion. Mitral Valve: The mitral valve is normal in structure. Moderate mitral valve regurgitation. Tricuspid Valve: The tricuspid valve is normal in structure. Tricuspid valve regurgitation is moderate to severe. Aortic Valve: The aortic valve is grossly normal.. There is mild thickening and mild calcification of the aortic valve. Aortic valve regurgitation is mild. There is mild thickening of the aortic valve. There is mild calcification of the aortic valve. Pulmonic Valve: The pulmonic valve was not well visualized. Pulmonic valve regurgitation is not visualized. No evidence of pulmonic stenosis. Aorta: The aortic root and ascending aorta are structurally normal, with no evidence of dilitation. Venous: The inferior vena cava is normal in size with less than 50% respiratory variability, suggesting right atrial pressure of 8 mmHg. IAS/Shunts: The atrial septum is grossly normal. Additional Comments: A pacer wire is visualized in the right atrium and right ventricle.  LEFT VENTRICLE PLAX 2D LVIDd:         5.80 cm LVIDs:         5.00 cm LV PW:         0.90 cm LV IVS:        0.70 cm LVOT diam:     1.80 cm LV SV:         23 LV SV Index:   13 LVOT Area:     2.54 cm  RIGHT VENTRICLE RV S prime:     12.10 cm/s TAPSE (M-mode): 2.5 cm LEFT ATRIUM             Index       RIGHT ATRIUM           Index LA diam:        4.40 cm 2.44 cm/m  RA Area:     22.10 cm LA Vol (A2C):  78.4 ml 43.42 ml/m RA Volume:   69.20 ml  38.32 ml/m LA Vol (A4C):   56.8 ml 31.45 ml/m LA Biplane Vol: 66.8 ml 36.99 ml/m  AORTIC VALVE LVOT Vmax:   59.75 cm/s LVOT Vmean:  43.100 cm/s LVOT VTI:    0.090 m  AORTA Ao Root diam: 2.60 cm TRICUSPID VALVE TR Peak grad:   39.9 mmHg TR Vmax:        316.00 cm/s  SHUNTS  Systemic VTI:  0.09 m Systemic Diam: 1.80 cm Buford Dresser MD Electronically signed by Buford Dresser MD Signature Date/Time: 11/27/2019/10:28:38 AM    Final      Patient Profile     MYLEKA MONCURE is a 54 y.o. female with a history of CHB s/p Medtronic dual chamber PPM 02/2019, SCAF, aortic insuffiency, HTN, HLD, T2DM, and COVID infection 08/2019 who is being seen today for the evaluation of Wide complex tachycardia at the request of Dr. Aundra Dubin .  Assessment & Plan    1.  VT, recurrent, multiple morphologies - sustained No further sustained VT since ~ 1530 yesterday. Her ectopy has significantly increased since starting on dopamine. Wean as tolerated. With pressure issues, will also change IV amiodarone to 400 mg TID to continue load.  K 4.6, Mg 1.9. Will supp Mg. Pt may benefit from cMRI, but ? If lead in RV would make imaging and interpretation difficult.  Will need to avoid BB for now in setting of acute decompensated CHF.  2. CHB s/p MDT DDD pacer Stable device function.  VT monitor zone decreased to 150 and alert turned on for now Pt with HIS bundle pacing with a QRS 132-136 ms. It is unclear if her chronic pacing would still lead to a cardiomyopathy in this position.  It is unclear what the reason for a CHB in this relatively young patient is at this point.   3. Acute systolic CHF,  EF down to 20-25% from 60-65% last May.  LHC 09/2018 without significant CAD, but now with new low EF plan repeat per Dr. Aundra Dubin once stable. Volume status remains elevated. Diuresis per HF team.  DDx for newly reduced EF includes CAD (though cath without significant CAD in 09/2018), Chronic RV pacing (see discussion above), or post-viral from COVID infection in 08/2019  4. Hypokalemia K up to 4.6 in setting of supplementation and AKI.   5. AKI In setting of VT and acute decompensated CHF.  Continue to follow.   For questions or updates, please contact [place] Please  consult [email or web address] for contact info under Cardiology/STEMI.  Jacalyn Lefevre, PA-C  11/28/2019, 6:54 AM    Ventricular tachycardia  Cardiomyopathy  ? Cause  PVC  Increased with dopamine  Hypotension  Acute renal injury  Pacemaker LBB area pacing  \ With hypotension would change amio to po and stop pressors if at all possible with complex ( and non perfusing) ectopy  Need to clarify mechanism of cardiomyopathy if possible This will need to await improvement in renal function-- discussed with Dr DM the role of cMRI and hopefully can be acccomplished with som useable info despite intracardiac leads

## 2019-11-28 NOTE — H&P (Signed)
NAME:  Lisa Poole, MRN:  [id number], DOB:  November 11, 1965, LOS: 2 ADMISSION DATE:  11/26/2019, CONSULTATION DATE:  11/28/2019 REFERRING MD:  Charissa Bash, MD  CHIEF COMPLAINT: Cardiogenic shock  Brief History   Patient transferred to ICU for cardiogenic shock.  History of present illness   Lisa Poole presented to emergency department on 7/18 for evaluation of shortness of breath, lower extremity swelling, and not feeling right.  Patient had signs of worsening heart failure including orthopnea, weight gain, elevated BNP, and pulmonary edema on chest x-ray.  She had episodes of V. tach 7/19 and cardiology was consulted and TTE ordered.    Of note , patient has a pmhx of complete heart block s/p medtronic dual chamber pacemaker 02/2019. No specific etiology for causing complete heart block was identified. She also has history of Covid-19 infection 08/2019, not requiring hospitilization.   TTE showed an EF 20 to 25% compared to echo in 2020 which showed EF 60 - 65%.  The echo also showed regional wall motion abnormalities (anterior, septal and lateral).  Moderate to severe TR with elevated RVSP,  and moderate MR. She was being dirueesed and started on amiodarone for NSVT.Overnight patient developed cardiogenic shock with hypotension and narrow pulse pressure with lactic acidosis and AKI. Dopamine was started and BP improved. With recurrent episodes of NSVT patient was transferred to ICU.     Denies any cough, fever, difficulty urinating before admission, rashes, joint pain, or recent changes in bowl movements.  Past Medical History  Past Medical History: No date: Aortic insufficiency 09/13/2006: Asthma 02/11/2019: Complete heart block ([place]) 12/12/2018: Cystocele without uterine prolapse No date: Diabetes mellitus without complication (HCC) No date: Elevated troponin level No date: Hypercholesteremia No date: Hypertension 12/10/2017: Primary osteoarthritis of left knee  Significant Hospital Events    7/18 admit 7/19 cardiogenic shock 7/20 transferred to from ED to ICU Consults:  EP cardiology HF cardiology Procedures:    Significant Diagnostic Tests:  TTE: EF severely reduced , 20-25%, compared to 60-65% one year ago  Micro Data:  MRSA PCR: Negative SARS coronavirus 2: Negative Antimicrobials:  N/A  Interim history/subjective:  See above  Objective   Blood pressure 112/80, pulse 84, temperature 98.4 F (36.9 C), temperature source Oral, resp. rate 20, height 5' (1.524 m), weight 90.1 kg, last menstrual period 07/25/2015, SpO2 97 %. CVP:  [18 mmHg] 18 mmHg      Intake/Output Summary (Last 24 hours) at 11/28/2019 1314 Last data filed at 11/28/2019 1000 Gross per 24 hour  Intake 920.88 ml  Output 2495 ml  Net -1574.12 ml   Filed Weights   11/26/19 1850 11/28/19 0700  Weight: 83.9 kg 90.1 kg    Examination:  General: NAD, nl appearance HE: Normocephalic, atraumatic , EOMI, Conjunctivae normal ENT: No congestion, no rhinorrhea, no exudate or erythema  Cardiovascular: Normal rate, regular rhythm.  No murmurs, rubs, or gallops Pulmonary : Effort normal, breath sounds normal. No wheezes, rales, or rhonchi Abdominal: soft, nontender,  bowel sounds present Musculoskeletal: no swelling , deformity, injury ,or tenderness in extremities, Skin: Warm, dry , no bruising, erythema, or rash Psychiatric/Behavioral:  normal mood, normal behavior  Neuro: AOx3     Resolved Hospital Problem list   Cardiogenic shock  Assessment & Plan:  #Acute systolic heart failure Patient presents with heart failure symptoms which have developed over a month  And acutely worsened in the past 2 weeks. Found to have a new reduce EF compared to one month ago. 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 Has risk factors for CAD of hypertension, hyperlipidemia and diabetes but less likely etiology given normal heart cath 1 year ago. Pacemaker  mediated tachycardia is most likely diagnosis, but will need to rule out infiltrative heart disease. Patient seems to be improving quickly today with control of her heart rate. On exam her levo was turned off and she satting well on room air. - She has been diuresed with IV 80 lasix x 3,  Net negative 2.8 L, appears normovolemic on exam Plan: - TSH - Ferritin and TSAT ,hemacromatosis screening, given hx of bronze skin and no significant sun exposure. - HF Cards has tentative plan for RHC/LHC when arhythmia is controlled - Diuresis per HF cardiology  - Agree with cardiac MRI, given age , hx of complete heart block , and now heart failure.  - With improvement this afternoon patient could likely be started on a BB   #Ventricular tachycardia #Hx of complete heart block s/p medtronic dual chamber pacemaker Patient HR has improved since stopping dopamine. Switched to PO amiodarone. Stopped levo given improvement in BP.  Electrolytes within normal ranges.   P: - EP Cardiology following  - Continue amiodarone   #AKI In setting of cardiogenic shock CR elevated to 2.25. Appears baseline Cr ~1.  Plan: Trend with diuresis and improvement in HR.  #T2DM Diabetes controlled , Hgb A1c 7, on Metformin -blood glucose elevated on last check above goal, will continue to monitor trend Plan: - SSI -M - monitor CBG  Best practice:  Diet: Low sodium DVT prophylaxis: Lovenox Glucose control: SSI Mobility: up with assistance Code Status: Full Family Communication: patient has updated husband Disposition: ICU  Labs   CBC: Recent Labs  Lab 11/26/19 1908 11/28/19 0612  WBC 8.3 10.5  HGB 13.8 14.8  HCT 46.1* 48.3*  MCV 90.2 86.9  PLT 311 179    Basic Metabolic Panel: Recent Labs  Lab 11/26/19 1908 11/26/19 2012 11/27/19 0402 11/27/19 1927 11/28/19 0215 11/28/19 0535  NA 141  --  141 139 137 138  K 4.4  --  3.2* 4.0 4.6 4.6  CL 103  --  101 98 96* 95*  CO2 27  --  29 27 24 26    GLUCOSE 160*  --  130* 134* 128* 139*  BUN 24*  --  22* 24* 26* 28*  CREATININE 1.37*  --  1.18* 1.85* 2.26* 2.16*  CALCIUM 9.0  --  8.8* 8.8* 9.1 9.3  MG  --  1.7  --  2.3  --  1.9  PHOS  --  3.5  --  3.9  --  4.4   GFR: Estimated Creatinine Clearance: 29.4 mL/min (A) (by C-G formula based on SCr of 2.16 mg/dL (H)). Recent Labs  Lab 11/26/19 1908 11/28/19 0036 11/28/19 0535 11/28/19 0612  WBC 8.3  --   --  10.5  LATICACIDVEN  --  5.0* 4.0*  --     Liver Function Tests: Recent Labs  Lab 11/28/19 0215 11/28/19 0535  AST 55* 103*  ALT 26 50*  ALKPHOS 83 85  BILITOT 2.8* 3.4*  PROT 6.5 6.6  ALBUMIN 3.2* 3.3*   No results for input(s): LIPASE, AMYLASE in the last 168 hours. No results for input(s): AMMONIA in the last 168 hours.  ABG No results found for: PHART, PCO2ART, PO2ART, HCO3, TCO2, ACIDBASEDEF, O2SAT   Coagulation Profile: No results for input(s): INR, PROTIME in the last 168 hours.  Cardiac  Enzymes: No results for input(s): CKTOTAL, CKMB, CKMBINDEX, TROPONINI in the last 168 hours.  HbA1C: Hgb A1c MFr Bld  Date/Time Value Ref Range Status  11/27/2019 04:02 AM 7.1 (H) 4.8 - 5.6 % Final    Comment:    (NOTE) Pre diabetes:          5.7%-6.4%  Diabetes:              >6.4%  Glycemic control for   <7.0% adults with diabetes   09/27/2018 02:31 PM 6.2 (H) 4.8 - 5.6 % Final    Comment:    (NOTE) Pre diabetes:          5.7%-6.4% Diabetes:              >6.4% Glycemic control for   <7.0% adults with diabetes     CBG: Recent Labs  Lab 11/27/19 0712 11/27/19 1241 11/27/19 1803 11/28/19 0624 11/28/19 1245  GLUCAP 142* 256* 177* 146* 270*    Review of Systems:   Complete review of systems otherwise normal unless mentioned under HPI  Past Medical History  She,  has a past medical history of Aortic insufficiency, Asthma (09/13/2006), Complete heart block ([place]) (02/11/2019), Cystocele without uterine prolapse (12/12/2018), Diabetes mellitus without  complication ([place]), Elevated troponin level, Hypercholesteremia, Hypertension, and Primary osteoarthritis of left knee (12/10/2017).   Surgical History    Past Surgical History:  Procedure Laterality Date  . APPENDECTOMY    . LEFT HEART CATH AND CORONARY ANGIOGRAPHY N/A 09/28/2018   Procedure: LEFT HEART CATH AND CORONARY ANGIOGRAPHY;  Surgeon: Troy Sine, MD;  Location: [facility];  Service: Cardiovascular;  Laterality: N/A;  . PACEMAKER IMPLANT N/A 02/13/2019   Procedure: PACEMAKER IMPLANT;  Surgeon: Evans Lance, MD;  Location: [place] [facility];  Service: Cardiovascular;  Laterality: N/A;  . PACEMAKER IMPLANT    . ROTATOR CUFF REPAIR Bilateral 2017  . SINUS EXPLORATION    . TOTAL KNEE ARTHROPLASTY Left 12/10/2017   Procedure: LEFT TOTAL KNEE ARTHROPLASTY;  Surgeon: Sydnee Cabal, MD;  Location: [facility];  Service: Orthopedics;  Laterality: Left;  Adductor Block  . TUBAL LIGATION       Social History   reports that she has never smoked. She has never used smokeless tobacco. She reports that she does not drink alcohol and does not use drugs.   Family History   Her family history includes Alzheimer's disease in her mother; Colon cancer in her maternal grandmother.   Allergies Allergies  Allergen Reactions  . Lisinopril Other (See Comments) and Cough    Flu like symptoms, also   . Ace Inhibitors Other (See Comments)    Flu-like symptoms  . Augmentin [Amoxicillin-Pot Clavulanate] Nausea And Vomiting  . Benazepril Other (See Comments)    Flu like symptoms  . Lotensin [Benazepril Hcl] Other (See Comments)    Flu like symptoms  . Oxycodone-Acetaminophen Nausea And Vomiting  . Sulfa Antibiotics Nausea And Vomiting     Home Medications  Prior to Admission medications   Medication Sig Start Date End Date Taking? Authorizing Provider  aspirin EC 81 MG tablet Take 81 mg by mouth daily. Swallow whole.   Yes [provider]  Biotin 10 MG CAPS Take 10 mg by mouth  daily.   Yes [provider]  cetirizine (ZYRTEC) 10 MG tablet Take 10 mg by mouth daily.   Yes [provider]  Cholecalciferol (VITAMIN D-3) 25 MCG (1000 UT) CAPS Take 1,000 Units by mouth daily.  Yes [provider]  gabapentin (NEURONTIN) 600 MG tablet Take 600 mg by mouth 2 (two) times daily.   Yes [provider]  hydrochlorothiazide (HYDRODIURIL) 25 MG tablet Take 25 mg by mouth daily. 09/25/19  Yes [provider]  HYDROcodone-acetaminophen (NORCO) 10-325 MG tablet Take 1 tablet by mouth 4 (four) times daily.    Yes [provider]  losartan (COZAAR) 25 MG tablet Take 0.5 tablets (12.5 mg total) by mouth daily. 09/29/18  Yes Reino Bellis B, NP  lovastatin (MEVACOR) 10 MG tablet Take 10 mg by mouth at bedtime.    Yes [provider]  meloxicam (MOBIC) 15 MG tablet Take 15 mg by mouth daily. 02/05/19  Yes [provider]  metFORMIN (GLUCOPHAGE) 500 MG tablet Take 500 mg by mouth 2 (two) times daily. 07/26/19  Yes [provider]  metoprolol tartrate (LOPRESSOR) 25 MG tablet TAKE 1 TABLET BY MOUTH TWICE DAILY Patient taking differently: Take 25 mg by mouth 2 (two) times daily.  07/17/19  Yes Shirley Friar, PA-C  Multiple Vitamins-Minerals (CENTRUM SILVER 50+WOMEN) TABS Take 1 tablet by mouth daily.   Yes [provider]  naloxone (NARCAN) 4 MG/0.1ML LIQD nasal spray kit Place 1 spray into the nose as directed.   Yes [provider]  omeprazole (PRILOSEC) 40 MG capsule Take 40 mg by mouth daily.   Yes [provider]  ondansetron (ZOFRAN ODT) 4 MG disintegrating tablet Take 1 tablet (4 mg total) by mouth every 8 (eight) hours as needed for nausea or vomiting. 10/20/19  Yes Lucrezia Starch, MD  venlafaxine XR (EFFEXOR-XR) 150 MG 24 hr capsule Take 150 mg by mouth daily. 05/07/19  Yes [provider]  acetaminophen (TYLENOL) 325 MG tablet Take 1-2 tablets (325-650 mg  total) by mouth every 4 (four) hours as needed for mild pain. Patient not taking: Reported on 11/26/2019 02/14/19   Shirley Friar, PA-C  albuterol (PROVENTIL HFA;VENTOLIN HFA) [phone] Base) MCG/ACT inhaler Inhale 1-2 puffs into the lungs every 6 (six) hours as needed for wheezing or shortness of breath. 07/30/18   Drenda Freeze, MD     Critical care time:      Tamsen Snider, MD PGY2

## 2019-11-28 NOTE — Progress Notes (Signed)
RT attempted aline in left radial artery unsuccessfully. RN and MD notified.

## 2019-11-28 NOTE — Progress Notes (Addendum)
Afternoon assessment.  PICC line place. Initial Co-ox while on 2 mcg of NE was 63%.  NE has since been discontinued. BP soft but stable at 104/84.    CVP elevated at 19. Poor UOP despite dose of 80 IV Lasix earlier today. Only 200 cc in UOP in foley bag.   Tele reviewed. Rhythm stable. NSR w/ occasional PVCs.   Pt alert and stable. No resting dyspnea. On physical exam, she has JVD elevated to ear. Pt personally examined by Dr. Aundra Dubin as well.   Discussed w/ Dr. Aundra Dubin. Give bolus of IV Lasix 40 mg x 1 and start Lasix gtt at 10 mg/hr. Will give additional KCl 40 mEq.   Repeat co-ox off NE is low at 53%.  Start milrinone 0.125 mcg/kg/min. Repeat co-ox in 3 hrs.   RN notified of plan. Will monitor rhythm closely while on milrinone. Continue PO amio 400 mg tid.   Lyda Jester, PA-C   Patient seen with PA, agree with the above note.   JVP clear elevated to level of earlobe, CVP 18 on my read.  She has significant volume overload.  Co-ox repeat off norepinephrine, 53%.   - Start low dose milrinone 0.125 mcg/kg/min, follow for ventricular arrhythmias => quiescent currently.  - Lasix 40 mg IV x 1 then 10 mg/hr gtt for diuresis.   Loralie Champagne 11/28/2019 5:04 PM

## 2019-11-29 DIAGNOSIS — N179 Acute kidney failure, unspecified: Secondary | ICD-10-CM | POA: Diagnosis not present

## 2019-11-29 DIAGNOSIS — I5043 Acute on chronic combined systolic (congestive) and diastolic (congestive) heart failure: Secondary | ICD-10-CM | POA: Diagnosis not present

## 2019-11-29 DIAGNOSIS — R0600 Dyspnea, unspecified: Secondary | ICD-10-CM | POA: Diagnosis not present

## 2019-11-29 LAB — CBC WITH DIFFERENTIAL/PLATELET
Abs Immature Granulocytes: 0.02 10*3/uL (ref 0.00–0.07)
Basophils Absolute: 0.1 10*3/uL (ref 0.0–0.1)
Basophils Relative: 1 %
Eosinophils Absolute: 0.3 10*3/uL (ref 0.0–0.5)
Eosinophils Relative: 4 %
HCT: 39.1 % (ref 36.0–46.0)
Hemoglobin: 12 g/dL (ref 12.0–15.0)
Immature Granulocytes: 0 %
Lymphocytes Relative: 21 %
Lymphs Abs: 1.7 10*3/uL (ref 0.7–4.0)
MCH: 26.9 pg (ref 26.0–34.0)
MCHC: 30.7 g/dL (ref 30.0–36.0)
MCV: 87.7 fL (ref 80.0–100.0)
Monocytes Absolute: 0.8 10*3/uL (ref 0.1–1.0)
Monocytes Relative: 10 %
Neutro Abs: 5.1 10*3/uL (ref 1.7–7.7)
Neutrophils Relative %: 64 %
Platelets: 186 10*3/uL (ref 150–400)
RBC: 4.46 MIL/uL (ref 3.87–5.11)
RDW: 15.4 % (ref 11.5–15.5)
WBC: 8 10*3/uL (ref 4.0–10.5)
nRBC: 0 % (ref 0.0–0.2)

## 2019-11-29 LAB — BASIC METABOLIC PANEL
Anion gap: 8 (ref 5–15)
Anion gap: 8 (ref 5–15)
BUN: 35 mg/dL — ABNORMAL HIGH (ref 6–20)
BUN: 33 mg/dL — ABNORMAL HIGH (ref 6–20)
CO2: 32 mmol/L (ref 22–32)
CO2: 32 mmol/L (ref 22–32)
Calcium: 8.5 mg/dL — ABNORMAL LOW (ref 8.9–10.3)
Calcium: 8.6 mg/dL — ABNORMAL LOW (ref 8.9–10.3)
Chloride: 97 mmol/L — ABNORMAL LOW (ref 98–111)
Chloride: 97 mmol/L — ABNORMAL LOW (ref 98–111)
Creatinine, Ser: 1.85 mg/dL — ABNORMAL HIGH (ref 0.44–1.00)
Creatinine, Ser: 1.55 mg/dL — ABNORMAL HIGH (ref 0.44–1.00)
GFR calc Af Amer: 35 mL/min — ABNORMAL LOW (ref >60)
GFR calc Af Amer: 43 mL/min — ABNORMAL LOW (ref >60)
GFR calc non Af Amer: 30 mL/min — ABNORMAL LOW (ref >60)
GFR calc non Af Amer: 37 mL/min — ABNORMAL LOW (ref >60)
Glucose, Bld: 211 mg/dL — ABNORMAL HIGH (ref 70–99)
Glucose, Bld: 239 mg/dL — ABNORMAL HIGH (ref 70–99)
Potassium: 3.8 mmol/L (ref 3.5–5.1)
Potassium: 3.9 mmol/L (ref 3.5–5.1)
Sodium: 137 mmol/L (ref 135–145)
Sodium: 137 mmol/L (ref 135–145)

## 2019-11-29 LAB — COOXEMETRY PANEL
Carboxyhemoglobin: 1.6 % — ABNORMAL HIGH (ref 0.5–1.5)
Methemoglobin: 1.1 % (ref 0.0–1.5)
O2 Saturation: 68.5 %
Total hemoglobin: 12 g/dL (ref 12.0–16.0)

## 2019-11-29 LAB — IRON AND TIBC
Iron: 16 ug/dL — ABNORMAL LOW (ref 28–170)
Saturation Ratios: 4 % — ABNORMAL LOW (ref 10.4–31.8)
TIBC: 428 ug/dL (ref 250–450)
UIBC: 412 ug/dL

## 2019-11-29 LAB — MAGNESIUM: Magnesium: 1.9 mg/dL (ref 1.7–2.4)

## 2019-11-29 LAB — GLUCOSE, CAPILLARY
Glucose-Capillary: 129 mg/dL — ABNORMAL HIGH (ref 70–99)
Glucose-Capillary: 189 mg/dL — ABNORMAL HIGH (ref 70–99)
Glucose-Capillary: 138 mg/dL — ABNORMAL HIGH (ref 70–99)
Glucose-Capillary: 157 mg/dL — ABNORMAL HIGH (ref 70–99)
Glucose-Capillary: 143 mg/dL — ABNORMAL HIGH (ref 70–99)

## 2019-11-29 LAB — TSH: TSH: 2.164 u[IU]/mL (ref 0.350–4.500)

## 2019-11-29 LAB — FERRITIN: Ferritin: 96 ng/mL (ref 11–307)

## 2019-11-29 MED ORDER — DIGOXIN 125 MCG PO TABS
0.0625 mg | ORAL_TABLET | Freq: Every day | ORAL | Status: DC
  Administered 2019-11-29 – 2019-11-30 (×2): 0.0625 mg via ORAL
  Filled 2019-11-29 (×2): qty 1

## 2019-11-29 MED ORDER — MAGNESIUM SULFATE 2 GM/50ML IV SOLN
2.0000 g | Freq: Once | INTRAVENOUS | Status: AC
  Administered 2019-11-29: 2 g via INTRAVENOUS
  Filled 2019-11-29: qty 50

## 2019-11-29 MED ORDER — AMIODARONE IV BOLUS ONLY 150 MG/100ML
150.0000 mg | Freq: Once | INTRAVENOUS | Status: AC
  Administered 2019-11-29: 150 mg via INTRAVENOUS
  Filled 2019-11-29: qty 100

## 2019-11-29 MED ORDER — DIGOXIN 125 MCG PO TABS: 0.1250 mg | ORAL_TABLET | Freq: Every day | ORAL | Status: DC

## 2019-11-29 MED ORDER — AMIODARONE HCL IN DEXTROSE 360-4.14 MG/200ML-% IV SOLN
30.0000 mg/h | INTRAVENOUS | Status: DC
  Administered 2019-11-29: 30 mg/h via INTRAVENOUS
  Filled 2019-11-29: qty 200

## 2019-11-29 MED ORDER — POTASSIUM CHLORIDE CRYS ER 20 MEQ PO TBCR
40.0000 meq | EXTENDED_RELEASE_TABLET | Freq: Two times a day (BID) | ORAL | Status: AC
  Administered 2019-11-29 (×2): 40 meq via ORAL
  Filled 2019-11-29 (×2): qty 2

## 2019-11-29 MED ORDER — AMIODARONE HCL IN DEXTROSE 360-4.14 MG/200ML-% IV SOLN
30.0000 mg/h | INTRAVENOUS | Status: DC
  Administered 2019-11-29 – 2019-12-02 (×5): 30 mg/h via INTRAVENOUS
  Filled 2019-11-29 (×6): qty 200

## 2019-11-29 NOTE — Progress Notes (Signed)
Electrophysiology Rounding Note  Patient Name: Lisa Poole Date of Encounter: 11/29/2019  Electrophysiologist: Dr. Lovena Le   Subjective   The patient is feeling better this am. No longer diaphoretic.  Started on milrinone with low coox and markedly elevated CVP. Amiodarone switched back to IV with increased ectopy on milrinone.   Inpatient Medications    Scheduled Meds: . aspirin EC  81 mg Oral Daily  . Chlorhexidine Gluconate Cloth  6 each Topical Daily  . digoxin  0.0625 mg Oral Daily  . enoxaparin (LOVENOX) injection  40 mg Subcutaneous QHS  . gabapentin  600 mg Oral BID  . HYDROcodone-acetaminophen  1 tablet Oral QID  . insulin aspart  0-15 Units Subcutaneous TID WC  . potassium chloride  40 mEq Oral BID  . pravastatin  10 mg Oral q1800  . sodium chloride flush  10-40 mL Intracatheter Q12H  . sodium chloride flush  3 mL Intravenous Once  . sodium chloride flush  3 mL Intravenous Q12H  . venlafaxine XR  150 mg Oral Daily   Continuous Infusions: . sodium chloride    . sodium chloride    . amiodarone    . furosemide (LASIX) infusion 10 mg/hr (11/29/19 0700)  . milrinone 0.125 mcg/kg/min (11/29/19 0700)  . norepinephrine (LEVOPHED) Adult infusion Stopped (11/28/19 2153)   PRN Meds: sodium chloride, Place/Maintain arterial line **AND** sodium chloride, acetaminophen, ipratropium-albuterol, sodium chloride flush, sodium chloride flush   Vital Signs    Vitals:   11/29/19 0400 11/29/19 0500 11/29/19 0600 11/29/19 0700  BP: 112/75 (!) 120/94 122/68 112/85  Pulse: 64 (!) 57 81 78  Resp: (!) 8 (!) 9 11 (!) 23  Temp: 97.8 F (36.6 C)   (!) (P) 96.5 F (35.8 C)  TempSrc: Axillary     SpO2: 98% 98% 99% 94%  Weight:  88.5 kg    Height:        Intake/Output Summary (Last 24 hours) at 11/29/2019 0849 Last data filed at 11/29/2019 0700 Gross per 24 hour  Intake 554.42 ml  Output 1840 ml  Net -1285.58 ml   Filed Weights   11/26/19 1850 11/28/19 0700 11/29/19 0500    Weight: 83.9 kg 90.1 kg 88.5 kg    Physical Exam    GEN- The patient is fatigue appearing, alert and oriented x 3 today.   Head- normocephalic, atraumatic Eyes-  Sclera clear, conjunctiva pink Ears- hearing intact Oropharynx- clear Neck- supple, JVP elevated Lungs- Clear to ausculation bilaterally, normal work of breathing Heart- Regular rate and rhythm, no murmurs, rubs or gallops GI- soft, NT, ND, + BS Extremities- no clubbing, cyanosis, or edema Skin- no rash or lesion Psych- euthymic mood, full affect Neuro- strength and sensation are intact  Labs    CBC Recent Labs    11/28/19 0612 11/29/19 0420  WBC 10.5 8.0  NEUTROABS  --  5.1  HGB 14.8 12.0  HCT 48.3* 39.1  MCV 86.9 87.7  PLT 194 734   Basic Metabolic Panel Recent Labs    11/27/19 1927 11/28/19 0215 11/28/19 0535 11/28/19 1500 11/28/19 2202 11/29/19 0420 11/29/19 0750  NA 139   < > 138   < > 137 137  --   K 4.0   < > 4.6   < > 3.6 3.8  --   CL 98   < > 95*   < > 98 97*  --   CO2 27   < > 26   < > 30 32  --  GLUCOSE 134*   < > 139*   < > 199* 211*  --   BUN 24*   < > 28*   < > 37* 35*  --   CREATININE 1.85*   < > 2.16*   < > 1.95* 1.85*  --   CALCIUM 8.8*   < > 9.3   < > 8.4* 8.5*  --   MG 2.3  --  1.9  --  2.2  --  1.9  PHOS 3.9  --  4.4  --   --   --   --    < > = values in this interval not displayed.   Liver Function Tests Recent Labs    11/28/19 0215 11/28/19 0535  AST 55* 103*  ALT 26 50*  ALKPHOS 83 85  BILITOT 2.8* 3.4*  PROT 6.5 6.6  ALBUMIN 3.2* 3.3*   No results for input(s): LIPASE, AMYLASE in the last 72 hours. Cardiac Enzymes No results for input(s): CKTOTAL, CKMB, CKMBINDEX, TROPONINI in the last 72 hours. BNP Invalid input(s): POCBNP D-Dimer No results for input(s): DDIMER in the last 72 hours. Hemoglobin A1C Recent Labs    11/28/19 1819  HGBA1C 7.2*   Fasting Lipid Panel No results for input(s): CHOL, HDL, LDLCALC, TRIG, CHOLHDL, LDLDIRECT in the last 72  hours. Thyroid Function Tests Recent Labs    11/29/19 0420  TSH 2.164    Telemetry    Mostly AS-VP but having frequent ectopy including runs of NSVT, amio has been transitioned back to IV now on milrinone (personally reviewed)  Radiology    ECHOCARDIOGRAM COMPLETE  Result Date: 11/27/2019    ECHOCARDIOGRAM REPORT   Patient Name:   Lisa Poole Date of Exam: 11/27/2019 Medical Rec #:  [id number]      Height:       60.0 in Accession #:    [id number]     Weight:       185.0 lb Date of Birth:  04/28/1966       BSA:          1.806 m Patient Age:    54 years       BP:           131/107 mmHg Patient Gender: F              HR:           120 bpm. Exam Location:  Inpatient Procedure: 2D Echo, Color Doppler, Cardiac Doppler and Intracardiac            Opacification Agent STAT ECHO Indications:    I50.9* Heart failure (unspecified)  History:        Patient has prior history of Echocardiogram examinations, most                 recent 09/28/2018. Pacemaker; Risk Factors:Hypertension, Diabetes                 and Dyslipidemia. COVID+ on 08/16/19.  Sonographer:    Raquel Sarna Senior RDCS Referring Phys: [phone] [facility] PFEIFFER  Sonographer Comments: Definity used to assess for thrombus IMPRESSIONS  1. Left ventricular ejection fraction, by estimation, is 20 to 25%. The left ventricle has severely decreased function. The left ventricle demonstrates regional wall motion abnormalities (see scoring diagram/findings for description). The left ventricular internal cavity size was mildly dilated. Left ventricular diastolic parameters are indeterminate.  2. Right ventricular systolic function is normal. The right ventricular size is mildly enlarged. There is moderately elevated pulmonary artery  systolic pressure. The estimated right ventricular systolic pressure is 97.0 mmHg.  3. Left atrial size was mildly dilated.  4. Right atrial size was moderately dilated.  5. The mitral valve is normal in structure. Moderate mitral valve  regurgitation.  6. Tricuspid valve regurgitation is moderate to severe.  7. The aortic valve is grossly normal. Aortic valve regurgitation is mild.  8. The inferior vena cava is normal in size with <50% respiratory variability, suggesting right atrial pressure of 8 mmHg. Comparison(s): Changes from prior study are noted. Conclusion(s)/Recommendation(s): EF severely reduced compared to prior studies. Wall motion abnormalities, most prominent in anterior, septal, and lateral walls. Moderate to severe TR with elevated RVSP. Moderate MR. LV thrombus excluded by definity contrast. Brief VT during the exam. FINDINGS  Left Ventricle: Left ventricular ejection fraction, by estimation, is 20 to 25%. The left ventricle has severely decreased function. The left ventricle demonstrates regional wall motion abnormalities. Definity contrast agent was given IV to delineate the left ventricular endocardial borders. The left ventricular internal cavity size was mildly dilated. There is no left ventricular hypertrophy. Left ventricular diastolic parameters are indeterminate.  LV Wall Scoring: The entire anterior wall, antero-lateral wall, entire anterior septum, and entire apex are akinetic. The inferior wall, posterior wall, mid inferoseptal segment, and basal inferoseptal segment are hypokinetic. Right Ventricle: The right ventricular size is mildly enlarged. Right vetricular wall thickness was not assessed. Right ventricular systolic function is normal. There is moderately elevated pulmonary artery systolic pressure. The tricuspid regurgitant velocity is 3.16 m/s, and with an assumed right atrial pressure of 8 mmHg, the estimated right ventricular systolic pressure is 26.3 mmHg. Left Atrium: Left atrial size was mildly dilated. Right Atrium: Right atrial size was moderately dilated. Pericardium: There is no evidence of pericardial effusion. Mitral Valve: The mitral valve is normal in structure. Moderate mitral valve regurgitation.  Tricuspid Valve: The tricuspid valve is normal in structure. Tricuspid valve regurgitation is moderate to severe. Aortic Valve: The aortic valve is grossly normal.. There is mild thickening and mild calcification of the aortic valve. Aortic valve regurgitation is mild. There is mild thickening of the aortic valve. There is mild calcification of the aortic valve. Pulmonic Valve: The pulmonic valve was not well visualized. Pulmonic valve regurgitation is not visualized. No evidence of pulmonic stenosis. Aorta: The aortic root and ascending aorta are structurally normal, with no evidence of dilitation. Venous: The inferior vena cava is normal in size with less than 50% respiratory variability, suggesting right atrial pressure of 8 mmHg. IAS/Shunts: The atrial septum is grossly normal. Additional Comments: A pacer wire is visualized in the right atrium and right ventricle.  LEFT VENTRICLE PLAX 2D LVIDd:         5.80 cm LVIDs:         5.00 cm LV PW:         0.90 cm LV IVS:        0.70 cm LVOT diam:     1.80 cm LV SV:         23 LV SV Index:   13 LVOT Area:     2.54 cm  RIGHT VENTRICLE RV S prime:     12.10 cm/s TAPSE (M-mode): 2.5 cm LEFT ATRIUM             Index       RIGHT ATRIUM           Index LA diam:        4.40 cm 2.44 cm/m  RA Area:     22.10 cm LA Vol (A2C):   78.4 ml 43.42 ml/m RA Volume:   69.20 ml  38.32 ml/m LA Vol (A4C):   56.8 ml 31.45 ml/m LA Biplane Vol: 66.8 ml 36.99 ml/m  AORTIC VALVE LVOT Vmax:   59.75 cm/s LVOT Vmean:  43.100 cm/s LVOT VTI:    0.090 m  AORTA Ao Root diam: 2.60 cm TRICUSPID VALVE TR Peak grad:   39.9 mmHg TR Vmax:        316.00 cm/s  SHUNTS Systemic VTI:  0.09 m Systemic Diam: 1.80 cm Buford Dresser MD Electronically signed by Buford Dresser MD Signature Date/Time: 11/27/2019/10:28:38 AM    Final    [place] EKG SITE RITE  Result Date: 11/28/2019 If Site Rite image not attached, placement could not be confirmed due to current cardiac rhythm.    Patient Profile      Lisa Poole a 54 y.o.femalewith a history of CHB s/p Medtronic dual chamber PPM 02/2019, SCAF, aortic insuffiency, HTN, HLD, T2DM, and COVID infection 4/2021who is being seen today for the evaluation of Wide complex tachycardiaat the request of Dr. Aundra Dubin.  Assessment & Plan    1.VT, recurrent, multiple morphologies - sustained No further long sustained VT at the time of my exam this am, but is having recurrent NSVT and frequent ectopy now on milrinone.  Amiodarone has been switched back to IV while on milrinone.  K 3.8, Mg 1.9.  Pt may benefit from cMRI, but ? If lead in RV would make imaging and interpretation difficult.  Will need to avoid BB for now in setting of acute decompensated CHF.  2. CHB s/p MDT DDD pacer Stable device function.  Pt with HIS bundle pacing with a QRS 132-136 ms. It is unclear if her chronic pacing would still lead to a cardiomyopathy in this position.  It is unclear what the reason for a CHB in this relatively young patient is at this point.  Will likely need ICD (vs ? CRT-D) upgrade. Will discuss with MD  3. Acute systolic CHF, EF down to 47-09% from 60-65% last May.  Remains volume overloaded and now on milrinone with low coox and markedly elevated CVP yesterday.  Plan repeat L/RHC once optimized per Dr. Aundra Dubin  DDx for newly reduced EF includes CAD (though cath without significant CAD in 09/2018), Chronic RV pacing (though confounded by HIS bundle pacing) or post-viral from COVID infection in 08/2019  4. Hypokalemia/Hypomagnesemia K 3.8. Goal >3.9  Mg 1.9. Goal > 1.9  5. AKI In setting of VT and acute decompensated CHF.  Continue to follow with diuresis.   For questions or updates, please contact [place] Please consult [email or web address] for contact info under Cardiology/STEMI.  Signed, Shirley Friar, PA-C  11/29/2019, 8:49 AM

## 2019-11-29 NOTE — Progress Notes (Signed)
Advanced Heart Failure Rounding Note  PCP-Cardiologist: No primary care provider on file.   Subjective:    This morning, co-ox 68.5% and creatinine down to 1.85.  MAP stable.  She is on milrinone 0.125 mcg/kg/min with Lasix gtt 10 mg/hr.  Weight down 3 lbs with I/Os negative 1639 cc.  Breathing better.  CVP 11-12 this morning (down from 19-20).   Still with frequent ectopy, NSVT.  She is on amiodarone po currently.    Objective:   Weight Range: 88.5 kg Body mass index is 38.1 kg/m.   Vital Signs:   Temp:  [97.8 F (36.6 C)-98.4 F (36.9 C)] 97.8 F (36.6 C) (07/21 0400) Pulse Rate:  [41-109] 78 (07/21 0700) Resp:  [8-32] 23 (07/21 0700) BP: (81-167)/(49-157) 112/85 (07/21 0700) SpO2:  [92 %-100 %] 94 % (07/21 0700) Weight:  [88.5 kg] 88.5 kg (07/21 0500) Last BM Date: 11/26/19  Weight change: Filed Weights   11/26/19 1850 11/28/19 0700 11/29/19 0500  Weight: 83.9 kg 90.1 kg 88.5 kg    Intake/Output:   Intake/Output Summary (Last 24 hours) at 11/29/2019 0734 Last data filed at 11/29/2019 0700 Gross per 24 hour  Intake 1000.68 ml  Output 2640 ml  Net -1639.32 ml      Physical Exam    General: NAD Neck: JVP 12 cm, no thyromegaly or thyroid nodule.  Lungs: Clear to auscultation bilaterally with normal respiratory effort. CV: Nondisplaced PMI.  Heart regular S1/S2, no S3/S4, no murmur.  No peripheral edema.  No carotid bruit.  Normal pedal pulses.  Abdomen: Soft, nontender, no hepatosplenomegaly, no distention.  Skin: Intact without lesions or rashes.  Neurologic: Alert and oriented x 3.  Psych: Normal affect. Extremities: No clubbing or cyanosis.  HEENT: Normal.    Telemetry   NSR with runs of NSVT and PVCs.  Personally reviewed.   Labs    CBC Recent Labs    11/28/19 0612 11/29/19 0420  WBC 10.5 8.0  NEUTROABS  --  5.1  HGB 14.8 12.0  HCT 48.3* 39.1  MCV 86.9 87.7  PLT 194 993   Basic Metabolic Panel Recent Labs    11/27/19 1927  11/28/19 0215 11/28/19 0535 11/28/19 1500 11/28/19 2202 11/29/19 0420  NA 139   < > 138   < > 137 137  K 4.0   < > 4.6   < > 3.6 3.8  CL 98   < > 95*   < > 98 97*  CO2 27   < > 26   < > 30 32  GLUCOSE 134*   < > 139*   < > 199* 211*  BUN 24*   < > 28*   < > 37* 35*  CREATININE 1.85*   < > 2.16*   < > 1.95* 1.85*  CALCIUM 8.8*   < > 9.3   < > 8.4* 8.5*  MG 2.3  --  1.9  --  2.2  --   PHOS 3.9  --  4.4  --   --   --    < > = values in this interval not displayed.   Liver Function Tests Recent Labs    11/28/19 0215 11/28/19 0535  AST 55* 103*  ALT 26 50*  ALKPHOS 83 85  BILITOT 2.8* 3.4*  PROT 6.5 6.6  ALBUMIN 3.2* 3.3*   No results for input(s): LIPASE, AMYLASE in the last 72 hours. Cardiac Enzymes No results for input(s): CKTOTAL, CKMB, CKMBINDEX, TROPONINI in the last 72  hours.  BNP: BNP (last 3 results) Recent Labs    11/26/19 2011  BNP 1,258.1*    ProBNP (last 3 results) No results for input(s): PROBNP in the last 8760 hours.   D-Dimer No results for input(s): DDIMER in the last 72 hours. Hemoglobin A1C Recent Labs    11/28/19 1819  HGBA1C 7.2*   Fasting Lipid Panel No results for input(s): CHOL, HDL, LDLCALC, TRIG, CHOLHDL, LDLDIRECT in the last 72 hours. Thyroid Function Tests Recent Labs    11/29/19 0420  TSH 2.164    Other results:   Imaging    [place] EKG SITE RITE  Result Date: 11/28/2019 If Site Rite image not attached, placement could not be confirmed due to current cardiac rhythm.    Medications:     Scheduled Medications: . aspirin EC  81 mg Oral Daily  . Chlorhexidine Gluconate Cloth  6 each Topical Daily  . digoxin  0.0625 mg Oral Daily  . enoxaparin (LOVENOX) injection  40 mg Subcutaneous QHS  . gabapentin  600 mg Oral BID  . HYDROcodone-acetaminophen  1 tablet Oral QID  . insulin aspart  0-15 Units Subcutaneous TID WC  . pravastatin  10 mg Oral q1800  . sodium chloride flush  10-40 mL Intracatheter Q12H  . sodium  chloride flush  3 mL Intravenous Once  . sodium chloride flush  3 mL Intravenous Q12H  . venlafaxine XR  150 mg Oral Daily    Infusions: . sodium chloride    . sodium chloride    . amiodarone    . furosemide (LASIX) infusion 10 mg/hr (11/29/19 0700)  . milrinone 0.125 mcg/kg/min (11/29/19 0700)  . norepinephrine (LEVOPHED) Adult infusion Stopped (11/28/19 2153)    PRN Medications: sodium chloride, Place/Maintain arterial line **AND** sodium chloride, acetaminophen, ipratropium-albuterol, sodium chloride flush, sodium chloride flush    Assessment/Plan   1. Acute systolic CHF: Echo this admission with EF newly decreased to 20-25% with septal-lateral dyssynchrony and septal severe hypokinesis, normal RV, moderate TR, moderate MR. Echo in 5/20 prior to PPM placement showed EF 60-65%.  Possible causes of cardiomyopathy include chronic RV pacing, ?CAD (has RFs), ?viral myocarditis (relatively recent COVID-19 infection).  No ETOH/drugs.  She has had increased dyspnea/palpitations x several weeks. Volume overloaded and CXR suggested pulmonary edema at time of admit. Developed CGS overnight 7/20 w/ hypotension and narrow pulse pressure, w/ subsequent AKI, SCr 1.18>>1.85>>2.26. Lactic acid increased from 2.1>>5.0. Diuresis subsequently limited. She was initially on dopamine, then norepinephrine, currently milrinone 0.125.  Now with creatinine down-trending at 1.85 today with co-ox 68.5% today.  CVP improved to 11-12 today. - Continue Lasix 10 mg/hr, replace K and repeat BMET in pm. - Will continue milrinone 0.125 for now, titrate off after diuresis completed.  - Add digoxin 0.0625 daily, can increase if creatinine continues to trend down.  - Will plan RHC/LHC when more diuresed and creatinine stabilized (hopefully later this week).  Need to rule out coronary disease (has DM, HTN) as cause of cardiomyopathy. - Will need cardiac MRI (device is compatible) when more stable => look for infiltrative  disease like sarcoid (we do not know why she had CHB) as well as evidence for prior myocarditis.  - She has left bundle pacing as an alternative to CRT => QRS in 130s.  She will need ICD upgrade, so will review with EP whether she should be upgraded as well to traditional CRT.  2. Wide complex tachycardia: VT with multiple morphologies.  Increased ventricular arrhythmias over 6  months.  ?Underlying cause.  Prior cath without coronary disease, plan to repeat.  Also consider infiltrative disease like sarcoidosis.  Still with NSVT in the setting of milrinone use.  I would like to continue milrinone today as creatinine decreases and we diurese her.  - Replace K and Mg aggressively.  - Will switch back to IV amiodarone while on milrinone.  - Will eventually need ICD upgrade.  3. H/o CHB: Cause is uncertain.  Cath in 5/20 prior to PPM showed no significant disease.  - Eventual cardiac MRI to look for infiltrative disease such as sarcoidosis.  4. AKI: In setting of hypotension.  Creatinine trending down on milrinone.   Length of Stay: 3  CRITICAL CARE Performed by: Loralie Champagne  Total critical care time: 35 minutes  Critical care time was exclusive of separately billable procedures and treating other patients.  Critical care was necessary to treat or prevent imminent or life-threatening deterioration.  Critical care was time spent personally by me on the following activities: development of treatment plan with patient and/or surrogate as well as nursing, discussions with consultants, evaluation of patient's response to treatment, examination of patient, obtaining history from patient or surrogate, ordering and performing treatments and interventions, ordering and review of laboratory studies, ordering and review of radiographic studies, pulse oximetry and re-evaluation of patient's condition.  Loralie Champagne 11/29/2019 7:34 AM

## 2019-11-29 NOTE — Progress Notes (Signed)
NAME:  Lisa Poole, MRN:  [id number], DOB:  [birth year], LOS: 3 ADMISSION DATE:  11/26/2019, CONSULTATION DATE:  11/28/2019 REFERRING MD:  Charissa Bash, MD  CHIEF COMPLAINT: Cardiogenic shock  Brief History   Patient transferred to ICU for cardiogenic shock.  History of present illness   Ms.Delosreyes presented to emergency department on 7/18 for evaluation of shortness of breath, lower extremity swelling, and not feeling right.  Patient had signs of worsening heart failure including orthopnea, weight gain, elevated BNP, and pulmonary edema on chest x-ray.  She had episodes of V. tach 7/19 and cardiology was consulted and TTE ordered.    Of note , patient has a pmhx of complete heart block s/p medtronic dual chamber pacemaker 02/2019. No specific etiology for causing complete heart block was identified. She also has history of Covid-19 infection 08/2019, not requiring hospitilization.   TTE showed an EF 20 to 25% compared to echo in 2020 which showed EF 60 - 65%.  The echo also showed regional wall motion abnormalities (anterior, septal and lateral).  Moderate to severe TR with elevated RVSP,  and moderate MR. She was being dirueesed and started on amiodarone for NSVT.Overnight patient developed cardiogenic shock with hypotension and narrow pulse pressure with lactic acidosis and AKI. Dopamine was started and BP improved. With recurrent episodes of NSVT patient was transferred to ICU.     Denies any cough, fever, difficulty urinating before admission, rashes, joint pain, or recent changes in bowl movements.  Past Medical History  Past Medical History: No date: Aortic insufficiency 09/13/2006: Asthma 02/11/2019: Complete heart block ([place]) 12/12/2018: Cystocele without uterine prolapse No date: Diabetes mellitus without complication (HCC) No date: Elevated troponin level No date: Hypercholesteremia No date: Hypertension 12/10/2017: Primary osteoarthritis of left knee  Significant Hospital Events     7/18 admit 7/19 cardiogenic shock 7/20 transferred to from ED to ICU Consults:  EP cardiology HF cardiology Procedures:    Significant Diagnostic Tests:  TTE: EF severely reduced , 20-25%, compared to 60-65% one year ago  Micro Data:  MRSA PCR: Negative SARS coronavirus 2: Negative Antimicrobials:  N/A  Interim history/subjective:  NAEON. Patient reports feeling better this morning. States that she is not short of breath and rest, but notes that she has only been out of bed to weigh herself.   Continues on IV amiodarone and Lasix drip. Milrinone drip stable at 0.125. Digoxin started at 0.625 to counteract arrhymogenic effects of milrinone.  Objective   Blood pressure 112/85, pulse 78, temperature (!) (P) 96.5 F (35.8 C), resp. rate (!) 23, height 5' (1.524 m), weight 88.5 kg, last menstrual period 07/25/2015, SpO2 94 %. CVP:  [0 mmHg-19 mmHg] 12 mmHg      Intake/Output Summary (Last 24 hours) at 11/29/2019 0841 Last data filed at 11/29/2019 0700 Gross per 24 hour  Intake 554.42 ml  Output 1840 ml  Net -1285.58 ml   Filed Weights   11/26/19 1850 11/28/19 0700 11/29/19 0500  Weight: 83.9 kg 90.1 kg 88.5 kg    Examination: General: NAD, well-developed HE: Normocephalic, atraumatic , EOMI, Conjunctivae normal ENT: MMM. No congestion, no rhinorrhea, no exudate or erythema  Cardiovascular: JVP elevated to approximately 11 cm, more easily visible on left than right. Normal rate, regular rhythm.  No murmurs, rubs, or gallops Pulmonary : CTAB. Normal respiratory effort. On [place].  Abdominal: soft, nontender,  bowel sounds present Musculoskeletal: No edema. No deformity, injury ,or tenderness in extremities Skin: Warm, dry , no bruising, erythema,  or rash Psychiatric/Behavioral:  normal mood, normal behavior  Neuro: AOx3     Resolved Hospital Problem list   Cardiogenic shock  Assessment & Plan:  #Acute systolic heart failure Patient presents with heart failure symptoms  which have developed over a month  And acutely worsened in the past 2 weeks. Found to have a new reduce EF compared to one month ago. 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 Has risk factors for CAD of hypertension, hyperlipidemia and diabetes but less likely etiology given normal heart cath 1 year ago. Pacemaker mediated tachycardia is most likely diagnosis, but will need to rule out infiltrative heart disease. No history of recent travel to suggest [place] disease as possible etiology. -Per cardiology, increasing VT burden over past six months. Difficult to say if this could be a cause of her cardiomyopathy (tachycardia-induced) vs a consequence of LV dilatation due to a pre-existing cardiomyopathy from a separate etiology. -She is feeling well this morning. Responded well to Lasix, continues on Lasix drip. -Iron studies not concerning for hemachromatosis (concern for infiltrative cardiomyopathy). -JVP 11-12. No other signs of volume overload on exam. -Started on Digoxin 0.0625 mg daily.  Plan: -Agree with HF Cards has plan for RHC/LHC when arhythmia is controlled. Cannot rule out ischemic etiology for cardiomyopathy, so will need cath.  -Continue to diurese, plan per HF cardiology. -Agree with cardiac MRI, given age , hx of complete heart block , and now heart failure. Infiltrative cardiomyopathy possible with possible causes including sarcoid vs amyloid vs hemochromatosis (unlikely given iron studies). -Serum ACE level to evaluate possible sarcoid cardiomyopathy. -Patient's blood pressure has improved and is stable today. Given this, we recommend started GDMT.   #Ventricular tachycardia #Hx of complete heart block s/p medtronic dual chamber pacemaker Patient HR has improved since stopping dopamine.  Electrolytes within normal ranges.   Digoxin started to combat arrhytmogenic affects of milrinone.  P: - EP Cardiology following  - Continue  amiodarone - Continue digoxin  - Will discuss with HF cardiology the possibility of discontinuing milrinone and adding a vasodilator such as hydralazine to reduce afterload.   #AKI Creatinine improving today, down to 1.85. Baseline appears to be ~1.  Plan: Trend with diuresis and improvement in HR.  #T2DM Diabetes controlled , Hgb A1c 7, on Metformin -Glucose elevated to 211 this AM. Will continue to monitor. Plan: - SSI -M - monitor CBG  Best practice:  Diet: Low sodium DVT prophylaxis: Lovenox Glucose control: SSI Mobility: up with assistance Code Status: Full Family Communication: patient has updated husband Disposition: ICU  Labs   CBC: Recent Labs  Lab 11/26/19 1908 11/28/19 0612 11/29/19 0420  WBC 8.3 10.5 8.0  NEUTROABS  --   --  5.1  HGB 13.8 14.8 12.0  HCT 46.1* 48.3* 39.1  MCV 90.2 86.9 87.7  PLT 311 194 716    Basic Metabolic Panel: Recent Labs  Lab 11/26/19 2012 11/27/19 0402 11/27/19 1927 11/27/19 1927 11/28/19 0215 11/28/19 0535 11/28/19 1500 11/28/19 2202 11/29/19 0420 11/29/19 0750  NA  --    < > 139   < > 137 138 137 137 137  --   K  --    < > 4.0   < > 4.6 4.6 3.5 3.6 3.8  --   CL  --    < > 98   < > 96* 95* 96* 98 97*  --   CO2  --    < > 27   < >  24 26 29 30  32  --   GLUCOSE  --    < > 134*   < > 128* 139* 206* 199* 211*  --   BUN  --    < > 24*   < > 26* 28* 34* 37* 35*  --   CREATININE  --    < > 1.85*   < > 2.26* 2.16* 2.10* 1.95* 1.85*  --   CALCIUM  --    < > 8.8*   < > 9.1 9.3 8.8* 8.4* 8.5*  --   MG 1.7  --  2.3  --   --  1.9  --  2.2  --  1.9  PHOS 3.5  --  3.9  --   --  4.4  --   --   --   --    < > = values in this interval not displayed.   GFR: Estimated Creatinine Clearance: 34 mL/min (A) (by C-G formula based on SCr of 1.85 mg/dL (H)). Recent Labs  Lab 11/26/19 1908 11/28/19 0036 11/28/19 0535 11/28/19 0612 11/28/19 0813 11/28/19 1413 11/28/19 1713 11/29/19 0420  WBC 8.3  --   --  10.5  --   --   --  8.0    LATICACIDVEN  --    < > 4.0*  --  2.5* 2.1* 2.2*  --    < > = values in this interval not displayed.    Liver Function Tests: Recent Labs  Lab 11/28/19 0215 11/28/19 0535  AST 55* 103*  ALT 26 50*  ALKPHOS 83 85  BILITOT 2.8* 3.4*  PROT 6.5 6.6  ALBUMIN 3.2* 3.3*   No results for input(s): LIPASE, AMYLASE in the last 168 hours. No results for input(s): AMMONIA in the last 168 hours.  ABG    Component Value Date/Time   O2SAT 68.5 11/29/2019 0420     Coagulation Profile: No results for input(s): INR, PROTIME in the last 168 hours.  Cardiac Enzymes: No results for input(s): CKTOTAL, CKMB, CKMBINDEX, TROPONINI in the last 168 hours.  HbA1C: Hgb A1c MFr Bld  Date/Time Value Ref Range Status  11/28/2019 06:19 PM 7.2 (H) 4.8 - 5.6 % Final    Comment:    (NOTE) Pre diabetes:          5.7%-6.4%  Diabetes:              >6.4%  Glycemic control for   <7.0% adults with diabetes   11/27/2019 04:02 AM 7.1 (H) 4.8 - 5.6 % Final    Comment:    (NOTE) Pre diabetes:          5.7%-6.4%  Diabetes:              >6.4%  Glycemic control for   <7.0% adults with diabetes     CBG: Recent Labs  Lab 11/28/19 0624 11/28/19 1245 11/28/19 1624 11/28/19 2128 11/29/19 0656  GLUCAP 146* 270* 156* 149* 129*    Review of Systems:   Complete review of systems otherwise normal unless mentioned under HPI  Past Medical History  She,  has a past medical history of Aortic insufficiency, Asthma (09/13/2006), Complete heart block ([place]) (02/11/2019), Cystocele without uterine prolapse (12/12/2018), Diabetes mellitus without complication ([place]), Elevated troponin level, Hypercholesteremia, Hypertension, and Primary osteoarthritis of left knee (12/10/2017).   Surgical History    Past Surgical History:  Procedure Laterality Date  . APPENDECTOMY    . LEFT HEART CATH AND CORONARY ANGIOGRAPHY N/A 09/28/2018  Procedure: LEFT HEART CATH AND CORONARY ANGIOGRAPHY;  Surgeon: Troy Sine, MD;   Location: [facility];  Service: Cardiovascular;  Laterality: N/A;  . PACEMAKER IMPLANT N/A 02/13/2019   Procedure: PACEMAKER IMPLANT;  Surgeon: Evans Lance, MD;  Location: [facility];  Service: Cardiovascular;  Laterality: N/A;  . PACEMAKER IMPLANT    . ROTATOR CUFF REPAIR Bilateral 2017  . SINUS EXPLORATION    . TOTAL KNEE ARTHROPLASTY Left 12/10/2017   Procedure: LEFT TOTAL KNEE ARTHROPLASTY;  Surgeon: Sydnee Cabal, MD;  Location: [facility];  Service: Orthopedics;  Laterality: Left;  Adductor Block  . TUBAL LIGATION       Social History   reports that she has never smoked. She has never used smokeless tobacco. She reports that she does not drink alcohol and does not use drugs.   Family History   Her family history includes Alzheimer's disease in her mother; Colon cancer in her maternal grandmother.   Allergies Allergies  Allergen Reactions  . Lisinopril Other (See Comments) and Cough    Flu like symptoms, also   . Ace Inhibitors Other (See Comments)    Flu-like symptoms  . Augmentin [Amoxicillin-Pot Clavulanate] Nausea And Vomiting  . Benazepril Other (See Comments)    Flu like symptoms  . Lotensin [Benazepril Hcl] Other (See Comments)    Flu like symptoms  . Oxycodone-Acetaminophen Nausea And Vomiting  . Sulfa Antibiotics Nausea And Vomiting     Home Medications  Prior to Admission medications   Medication Sig Start Date End Date Taking? Authorizing Provider  aspirin EC 81 MG tablet Take 81 mg by mouth daily. Swallow whole.   Yes [provider]  Biotin 10 MG CAPS Take 10 mg by mouth daily.   Yes [provider]  cetirizine (ZYRTEC) 10 MG tablet Take 10 mg by mouth daily.   Yes [provider]  Cholecalciferol (VITAMIN D-3) 25 MCG (1000 UT) CAPS Take 1,000 Units by mouth daily.    Yes [provider]  gabapentin (NEURONTIN) 600 MG tablet Take 600 mg by mouth 2 (two) times daily.   Yes [provider]    hydrochlorothiazide (HYDRODIURIL) 25 MG tablet Take 25 mg by mouth daily. 09/25/19  Yes [provider]  HYDROcodone-acetaminophen (NORCO) 10-325 MG tablet Take 1 tablet by mouth 4 (four) times daily.    Yes [provider]  losartan (COZAAR) 25 MG tablet Take 0.5 tablets (12.5 mg total) by mouth daily. 09/29/18  Yes Reino Bellis B, NP  lovastatin (MEVACOR) 10 MG tablet Take 10 mg by mouth at bedtime.    Yes [provider]  meloxicam (MOBIC) 15 MG tablet Take 15 mg by mouth daily. 02/05/19  Yes [provider]  metFORMIN (GLUCOPHAGE) 500 MG tablet Take 500 mg by mouth 2 (two) times daily. 07/26/19  Yes [provider]  metoprolol tartrate (LOPRESSOR) 25 MG tablet TAKE 1 TABLET BY MOUTH TWICE DAILY Patient taking differently: Take 25 mg by mouth 2 (two) times daily.  07/17/19  Yes Shirley Friar, PA-C  Multiple Vitamins-Minerals (CENTRUM SILVER 50+WOMEN) TABS Take 1 tablet by mouth daily.   Yes [provider]  naloxone (NARCAN) 4 MG/0.1ML LIQD nasal spray kit Place 1 spray into the nose as directed.   Yes [provider]  omeprazole (PRILOSEC) 40 MG capsule Take 40 mg by mouth daily.   Yes [provider]  ondansetron (ZOFRAN ODT) 4 MG disintegrating tablet Take 1 tablet (4 mg total) by  mouth every 8 (eight) hours as needed for nausea or vomiting. 10/20/19  Yes Lucrezia Starch, MD  venlafaxine XR (EFFEXOR-XR) 150 MG 24 hr capsule Take 150 mg by mouth daily. 05/07/19  Yes [provider]  acetaminophen (TYLENOL) 325 MG tablet Take 1-2 tablets (325-650 mg total) by mouth every 4 (four) hours as needed for mild pain. Patient not taking: Reported on 11/26/2019 02/14/19   Shirley Friar, PA-C  albuterol (PROVENTIL HFA;VENTOLIN HFA) 108 [phone] Base) MCG/ACT inhaler Inhale 1-2 puffs into the lungs every 6 (six) hours as needed for wheezing or shortness of breath. 07/30/18   Drenda Freeze, MD     Critical  care time:       Riccardo Dubin, MS4

## 2019-11-30 LAB — CBC WITH DIFFERENTIAL/PLATELET
Abs Immature Granulocytes: 0.02 10*3/uL (ref 0.00–0.07)
Basophils Absolute: 0.1 10*3/uL (ref 0.0–0.1)
Basophils Relative: 1 %
Eosinophils Absolute: 0.3 10*3/uL (ref 0.0–0.5)
Eosinophils Relative: 5 %
HCT: 40.9 % (ref 36.0–46.0)
Hemoglobin: 12.5 g/dL (ref 12.0–15.0)
Immature Granulocytes: 0 %
Lymphocytes Relative: 35 %
Lymphs Abs: 2.3 10*3/uL (ref 0.7–4.0)
MCH: 27 pg (ref 26.0–34.0)
MCHC: 30.6 g/dL (ref 30.0–36.0)
MCV: 88.3 fL (ref 80.0–100.0)
Monocytes Absolute: 0.8 10*3/uL (ref 0.1–1.0)
Monocytes Relative: 11 %
Neutro Abs: 3.2 10*3/uL (ref 1.7–7.7)
Neutrophils Relative %: 48 %
Platelets: 204 10*3/uL (ref 150–400)
RBC: 4.63 MIL/uL (ref 3.87–5.11)
RDW: 15.3 % (ref 11.5–15.5)
WBC: 6.7 10*3/uL (ref 4.0–10.5)
nRBC: 0 % (ref 0.0–0.2)

## 2019-11-30 LAB — BASIC METABOLIC PANEL
Anion gap: 12 (ref 5–15)
BUN: 32 mg/dL — ABNORMAL HIGH (ref 6–20)
CO2: 30 mmol/L (ref 22–32)
Calcium: 8.4 mg/dL — ABNORMAL LOW (ref 8.9–10.3)
Chloride: 93 mmol/L — ABNORMAL LOW (ref 98–111)
Creatinine, Ser: 1.59 mg/dL — ABNORMAL HIGH (ref 0.44–1.00)
GFR calc Af Amer: 42 mL/min — ABNORMAL LOW (ref >60)
GFR calc non Af Amer: 36 mL/min — ABNORMAL LOW (ref >60)
Glucose, Bld: 263 mg/dL — ABNORMAL HIGH (ref 70–99)
Potassium: 3.8 mmol/L (ref 3.5–5.1)
Sodium: 135 mmol/L (ref 135–145)

## 2019-11-30 LAB — GLUCOSE, CAPILLARY
Glucose-Capillary: 161 mg/dL — ABNORMAL HIGH (ref 70–99)
Glucose-Capillary: 180 mg/dL — ABNORMAL HIGH (ref 70–99)
Glucose-Capillary: 191 mg/dL — ABNORMAL HIGH (ref 70–99)
Glucose-Capillary: 174 mg/dL — ABNORMAL HIGH (ref 70–99)
Glucose-Capillary: 101 mg/dL — ABNORMAL HIGH (ref 70–99)

## 2019-11-30 LAB — COOXEMETRY PANEL
Carboxyhemoglobin: 1.4 % (ref 0.5–1.5)
Carboxyhemoglobin: 1.6 % — ABNORMAL HIGH (ref 0.5–1.5)
Methemoglobin: 0.9 % (ref 0.0–1.5)
Methemoglobin: 1 % (ref 0.0–1.5)
O2 Saturation: 46.6 %
O2 Saturation: 61 %
Total hemoglobin: 13.3 g/dL (ref 12.0–16.0)
Total hemoglobin: 12.6 g/dL (ref 12.0–16.0)

## 2019-11-30 LAB — ANGIOTENSIN CONVERTING ENZYME: Angiotensin-Converting Enzyme: 81 U/L (ref 14–82)

## 2019-11-30 LAB — MAGNESIUM: Magnesium: 1.9 mg/dL (ref 1.7–2.4)

## 2019-11-30 MED ORDER — SODIUM CHLORIDE 0.9% FLUSH
3.0000 mL | Freq: Two times a day (BID) | INTRAVENOUS | Status: DC
  Administered 2019-11-30 – 2019-12-07 (×10): 3 mL via INTRAVENOUS

## 2019-11-30 MED ORDER — ASPIRIN 81 MG PO CHEW
81.0000 mg | CHEWABLE_TABLET | ORAL | Status: AC
  Administered 2019-12-01: 81 mg via ORAL
  Filled 2019-11-30: qty 1

## 2019-11-30 MED ORDER — POTASSIUM CHLORIDE CRYS ER 20 MEQ PO TBCR
40.0000 meq | EXTENDED_RELEASE_TABLET | Freq: Two times a day (BID) | ORAL | Status: DC
  Administered 2019-11-30 – 2019-12-01 (×4): 40 meq via ORAL
  Filled 2019-11-30 (×5): qty 2

## 2019-11-30 MED ORDER — POTASSIUM CHLORIDE CRYS ER 20 MEQ PO TBCR: 40.0000 meq | EXTENDED_RELEASE_TABLET | Freq: Once | ORAL | Status: DC

## 2019-11-30 MED ORDER — SODIUM CHLORIDE 0.9 % IV SOLN: 250.0000 mL | INTRAVENOUS | Status: DC | PRN

## 2019-11-30 MED ORDER — SODIUM CHLORIDE 0.9 % IV SOLN: INTRAVENOUS | Status: DC

## 2019-11-30 MED ORDER — SODIUM CHLORIDE 0.9% FLUSH: 3.0000 mL | INTRAVENOUS | Status: DC | PRN

## 2019-11-30 MED ORDER — MAGNESIUM SULFATE 2 GM/50ML IV SOLN
2.0000 g | Freq: Once | INTRAVENOUS | Status: AC
  Administered 2019-11-30: 2 g via INTRAVENOUS
  Filled 2019-11-30: qty 50

## 2019-11-30 MED ORDER — SPIRONOLACTONE 12.5 MG HALF TABLET
12.5000 mg | ORAL_TABLET | Freq: Every day | ORAL | Status: DC
  Administered 2019-11-30 – 2019-12-02 (×3): 12.5 mg via ORAL
  Filled 2019-11-30 (×3): qty 1

## 2019-11-30 MED ORDER — NOREPINEPHRINE 4 MG/250ML-% IV SOLN
1.0000 ug/min | INTRAVENOUS | Status: DC
  Administered 2019-11-30 (×2): 3 ug/min via INTRAVENOUS
  Filled 2019-11-30: qty 250

## 2019-11-30 NOTE — Progress Notes (Signed)
Pt's daughter requested a social work consult for family dynamics. Will follow up with social work.

## 2019-11-30 NOTE — Progress Notes (Signed)
NAME:  Lisa Poole, MRN:  [id number], DOB:  [birth year], LOS: 4 ADMISSION DATE:  11/26/2019, CONSULTATION DATE:  11/28/2019 REFERRING MD:  Charissa Bash, MD  CHIEF COMPLAINT: Cardiogenic shock  Brief History   Patient transferred to ICU for cardiogenic shock.  History of present illness   Lisa Poole presented to emergency department on 7/18 for evaluation of shortness of breath, lower extremity swelling, and not feeling right.  Patient had signs of worsening heart failure including orthopnea, weight gain, elevated BNP, and pulmonary edema on chest x-ray.  She had episodes of V. tach 7/19 and cardiology was consulted and TTE ordered.    Of note , patient has a pmhx of complete heart block s/p medtronic dual chamber pacemaker 02/2019. No specific etiology for causing complete heart block was identified. She also has history of Covid-19 infection 08/2019, not requiring hospitilization.   TTE showed an EF 20 to 25% compared to echo in 2020 which showed EF 60 - 65%.  The echo also showed regional wall motion abnormalities (anterior, septal and lateral).  Moderate to severe TR with elevated RVSP,  and moderate MR. She was being dirueesed and started on amiodarone for NSVT.Overnight patient developed cardiogenic shock with hypotension and narrow pulse pressure with lactic acidosis and AKI. Dopamine was started and BP improved. With recurrent episodes of NSVT patient was transferred to ICU.     Denies any cough, fever, difficulty urinating before admission, rashes, joint pain, or recent changes in bowl movements.  Past Medical History  Past Medical History: No date: Aortic insufficiency 09/13/2006: Asthma 02/11/2019: Complete heart block ([place]) 12/12/2018: Cystocele without uterine prolapse No date: Diabetes mellitus without complication (HCC) No date: Elevated troponin level No date: Hypercholesteremia No date: Hypertension 12/10/2017: Primary osteoarthritis of left knee  Significant Hospital Events     7/18 admit 7/19 cardiogenic shock 7/20 transferred to from ED to ICU Consults:  EP cardiology HF cardiology Procedures:    Significant Diagnostic Tests:  TTE: EF severely reduced , 20-25%, compared to 60-65% one year ago  Micro Data:  MRSA PCR: Negative SARS coronavirus 2: Negative Antimicrobials:  N/A  Interim history/subjective:  NAEON. Milrinone discontinued in interval. Patient started on stable infusion of norepinephrine. Lasix drip increased to 12 this AM. Started on spironolactone 12.5 daily.  She reports feeling the same today as she did yesterday. She was able go without supplemental oxygen for much of yesterday but was placed back on 2L [place] overnight.  Objective   Blood pressure (!) 122/97, pulse 76, temperature 98 F (36.7 C), temperature source Oral, resp. rate 15, height 5' (1.524 m), weight 88.5 kg, last menstrual period 07/25/2015, SpO2 99 %. CVP:  [11 mmHg-15 mmHg] 15 mmHg      Intake/Output Summary (Last 24 hours) at 11/30/2019 0849 Last data filed at 11/30/2019 0700 Gross per 24 hour  Intake 1668.92 ml  Output 2970 ml  Net -1301.08 ml   Filed Weights   11/26/19 1850 11/28/19 0700 11/29/19 0500  Weight: 83.9 kg 90.1 kg 88.5 kg    Examination: General: NAD, well-developed HE: Normocephalic, atraumatic , EOMI, Conjunctivae normal ENT: MMM. No congestion, no rhinorrhea, no exudate or erythema  Cardiovascular: JVP approximately 3 cm above clavicle, more easily visible on left than right. Normal rate, regular rhythm.  No murmurs, rubs, or gallops Pulmonary : CTAB. Normal respiratory effort. On [place].  Abdominal: soft, nontender,  bowel sounds present Musculoskeletal: No edema. No deformity, injury ,or tenderness in extremities Skin: Warm, dry , no bruising,  erythema, or rash Psychiatric/Behavioral:  normal mood, normal behavior  Neuro: AOx3     Resolved Hospital Problem list   Cardiogenic shock  Assessment & Plan:  #Acute systolic heart  failure Patient presents with heart failure symptoms which have developed over a month  And acutely worsened in the past 2 weeks. Found to have a new reduce EF compared to one month ago. 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 Has risk factors for CAD of hypertension, hyperlipidemia and diabetes but less likely etiology given normal heart cath 1 year ago. Pacemaker mediated tachycardia is most likely diagnosis, but will need to rule out infiltrative heart disease. No history of recent travel to suggest [place] disease as possible etiology. -Per cardiology, increasing VT burden over past six months. Difficult to say if this could be a cause of her cardiomyopathy (tachycardia-induced) vs a consequence of LV dilatation due to a pre-existing cardiomyopathy from a separate etiology. -She feels unchanged from yesterday. -Continues on Lasix drip, up to 12 today from 10 yesterday. -Spironolactone started, 12.5 mg daily. -CVP remains elevated at 15-16. Plan: -Agree with HF Cards has plan for RHC/LHC. Will continue to monitor AKI and pursue cath when Cr normalizes. -Continue to diurese, plan per HF cardiology. -Agree with cardiac MRI, given age , hx of complete heart block , and now heart failure. Infiltrative cardiomyopathy possible with possible causes including sarcoid vs amyloid vs hemochromatosis (unlikely given iron studies). -F/u serum ACE level -Start norepinephrine drip  -Defer ACE-inhibitor and B-blocker for now   #Ventricular tachycardia #Hx of complete heart block s/p medtronic dual chamber pacemaker Milrinone discontinued due to persistent ectopy.  Plan: -EP Cardiology following  -Continue amiodarone -Continue digoxin  -Goal K > 4, Mg > 2 -Replete as needed   #AKI Creatinine continues to improve. 1.59 today. Baseline appears to be ~1.  Plan: -Trend with diuresis and improvement in HR. -Planning for RHC/LHC when Cr  normalizes  #T2DM Diabetes controlled , Hgb A1c 7, on Metformin. -Glucose 161 this morning. Will continue on current insulin regimen. Plan: -SSI -M -monitor CBG  Best practice:  Diet: Low sodium DVT prophylaxis: Lovenox Glucose control: SSI Mobility: up with assistance Code Status: Full Family Communication: patient has updated husband Disposition: ICU  Labs   CBC: Recent Labs  Lab 11/26/19 1908 11/28/19 0612 11/29/19 0420 11/30/19 0350  WBC 8.3 10.5 8.0 6.7  NEUTROABS  --   --  5.1 3.2  HGB 13.8 14.8 12.0 12.5  HCT 46.1* 48.3* 39.1 40.9  MCV 90.2 86.9 87.7 88.3  PLT 311 194 186 202    Basic Metabolic Panel: Recent Labs  Lab 11/26/19 2012 11/27/19 0402 11/27/19 1927 11/28/19 0215 11/28/19 0535 11/28/19 0535 11/28/19 1500 11/28/19 2202 11/29/19 0420 11/29/19 0750 11/29/19 1500 11/30/19 0350  NA  --    < > 139   < > 138   < > 137 137 137  --  137 135  K  --    < > 4.0   < > 4.6   < > 3.5 3.6 3.8  --  3.9 3.8  CL  --    < > 98   < > 95*   < > 96* 98 97*  --  97* 93*  CO2  --    < > 27   < > 26   < > 29 30 32  --  32 30  GLUCOSE  --    < > 134*   < >  139*   < > 206* 199* 211*  --  239* 263*  BUN  --    < > 24*   < > 28*   < > 34* 37* 35*  --  33* 32*  CREATININE  --    < > 1.85*   < > 2.16*   < > 2.10* 1.95* 1.85*  --  1.55* 1.59*  CALCIUM  --    < > 8.8*   < > 9.3   < > 8.8* 8.4* 8.5*  --  8.6* 8.4*  MG 1.7  --  2.3  --  1.9  --   --  2.2  --  1.9  --  1.9  PHOS 3.5  --  3.9  --  4.4  --   --   --   --   --   --   --    < > = values in this interval not displayed.   GFR: Estimated Creatinine Clearance: 39.6 mL/min (A) (by C-G formula based on SCr of 1.59 mg/dL (H)). Recent Labs  Lab 11/26/19 1908 11/28/19 0036 11/28/19 0535 11/28/19 0612 11/28/19 0813 11/28/19 1413 11/28/19 1713 11/29/19 0420 11/30/19 0350  WBC 8.3  --   --  10.5  --   --   --  8.0 6.7  LATICACIDVEN  --    < > 4.0*  --  2.5* 2.1* 2.2*  --   --    < > = values in this interval  not displayed.    Liver Function Tests: Recent Labs  Lab 11/28/19 0215 11/28/19 0535  AST 55* 103*  ALT 26 50*  ALKPHOS 83 85  BILITOT 2.8* 3.4*  PROT 6.5 6.6  ALBUMIN 3.2* 3.3*   No results for input(s): LIPASE, AMYLASE in the last 168 hours. No results for input(s): AMMONIA in the last 168 hours.  ABG    Component Value Date/Time   O2SAT 61.0 11/30/2019 0448     Coagulation Profile: No results for input(s): INR, PROTIME in the last 168 hours.  Cardiac Enzymes: No results for input(s): CKTOTAL, CKMB, CKMBINDEX, TROPONINI in the last 168 hours.  HbA1C: Hgb A1c MFr Bld  Date/Time Value Ref Range Status  11/28/2019 06:19 PM 7.2 (H) 4.8 - 5.6 % Final    Comment:    (NOTE) Pre diabetes:          5.7%-6.4%  Diabetes:              >6.4%  Glycemic control for   <7.0% adults with diabetes   11/27/2019 04:02 AM 7.1 (H) 4.8 - 5.6 % Final    Comment:    (NOTE) Pre diabetes:          5.7%-6.4%  Diabetes:              >6.4%  Glycemic control for   <7.0% adults with diabetes     CBG: Recent Labs  Lab 11/29/19 1147 11/29/19 1543 11/29/19 2057 11/29/19 2141 11/30/19 0706  GLUCAP 189* 138* 157* 143* 161*    Review of Systems:   Complete review of systems otherwise normal unless mentioned under HPI  Past Medical History  She,  has a past medical history of Aortic insufficiency, Asthma (09/13/2006), Complete heart block ([place]) (02/11/2019), Cystocele without uterine prolapse (12/12/2018), Diabetes mellitus without complication ([place]), Elevated troponin level, Hypercholesteremia, Hypertension, and Primary osteoarthritis of left knee (12/10/2017).   Surgical History    Past Surgical History:  Procedure Laterality Date   APPENDECTOMY  LEFT HEART CATH AND CORONARY ANGIOGRAPHY N/A 09/28/2018   Procedure: LEFT HEART CATH AND CORONARY ANGIOGRAPHY;  Surgeon: Troy Sine, MD;  Location: [facility];  Service: Cardiovascular;  Laterality: N/A;   PACEMAKER  IMPLANT N/A 02/13/2019   Procedure: PACEMAKER IMPLANT;  Surgeon: Evans Lance, MD;  Location: [facility];  Service: Cardiovascular;  Laterality: N/A;   PACEMAKER IMPLANT     ROTATOR CUFF REPAIR Bilateral 2017   SINUS EXPLORATION     TOTAL KNEE ARTHROPLASTY Left 12/10/2017   Procedure: LEFT TOTAL KNEE ARTHROPLASTY;  Surgeon: Sydnee Cabal, MD;  Location: [facility];  Service: Orthopedics;  Laterality: Left;  Adductor Block   TUBAL LIGATION       Social History   reports that she has never smoked. She has never used smokeless tobacco. She reports that she does not drink alcohol and does not use drugs.   Family History   Her family history includes Alzheimer's disease in her mother; Colon cancer in her maternal grandmother.   Allergies Allergies  Allergen Reactions   Lisinopril Other (See Comments) and Cough    Flu like symptoms, also    Ace Inhibitors Other (See Comments)    Flu-like symptoms   Augmentin [Amoxicillin-Pot Clavulanate] Nausea And Vomiting   Benazepril Other (See Comments)    Flu like symptoms   Lotensin [Benazepril Hcl] Other (See Comments)    Flu like symptoms   Oxycodone-Acetaminophen Nausea And Vomiting   Sulfa Antibiotics Nausea And Vomiting     Home Medications  Prior to Admission medications   Medication Sig Start Date End Date Taking? Authorizing Provider  aspirin EC 81 MG tablet Take 81 mg by mouth daily. Swallow whole.   Yes [provider]  Biotin 10 MG CAPS Take 10 mg by mouth daily.   Yes [provider]  cetirizine (ZYRTEC) 10 MG tablet Take 10 mg by mouth daily.   Yes [provider]  Cholecalciferol (VITAMIN D-3) 25 MCG (1000 UT) CAPS Take 1,000 Units by mouth daily.    Yes [provider]  gabapentin (NEURONTIN) 600 MG tablet Take 600 mg by mouth 2 (two) times daily.   Yes [provider]  hydrochlorothiazide (HYDRODIURIL) 25 MG tablet Take 25 mg by mouth daily. 09/25/19  Yes [provider]  HYDROcodone-acetaminophen (NORCO) 10-325 MG tablet Take 1 tablet by mouth 4 (four) times daily.    Yes [provider]  losartan (COZAAR) 25 MG tablet Take 0.5 tablets (12.5 mg total) by mouth daily. 09/29/18  Yes Reino Bellis B, NP  lovastatin (MEVACOR) 10 MG tablet Take 10 mg by mouth at bedtime.    Yes [provider]  meloxicam (MOBIC) 15 MG tablet Take 15 mg by mouth daily. 02/05/19  Yes [provider]  metFORMIN (GLUCOPHAGE) 500 MG tablet Take 500 mg by mouth 2 (two) times daily. 07/26/19  Yes [provider]  metoprolol tartrate (LOPRESSOR) 25 MG tablet TAKE 1 TABLET BY MOUTH TWICE DAILY Patient taking differently: Take 25 mg by mouth 2 (two) times daily.  07/17/19  Yes Shirley Friar, PA-C  Multiple Vitamins-Minerals (CENTRUM SILVER 50+WOMEN) TABS Take 1 tablet by mouth daily.   Yes [provider]  naloxone (NARCAN) 4 MG/0.1ML LIQD nasal spray kit Place 1 spray into the nose as directed.   Yes [provider]  omeprazole (PRILOSEC) 40 MG capsule Take 40 mg by mouth daily.   Yes [provider]  ondansetron (ZOFRAN ODT) 4 MG  disintegrating tablet Take 1 tablet (4 mg total) by mouth every 8 (eight) hours as needed for nausea or vomiting. 10/20/19  Yes Lucrezia Starch, MD  venlafaxine XR (EFFEXOR-XR) 150 MG 24 hr capsule Take 150 mg by mouth daily. 05/07/19  Yes [provider]  acetaminophen (TYLENOL) 325 MG tablet Take 1-2 tablets (325-650 mg total) by mouth every 4 (four) hours as needed for mild pain. Patient not taking: Reported on 11/26/2019 02/14/19   Shirley Friar, PA-C  albuterol (PROVENTIL HFA;VENTOLIN HFA) 108 [phone] Base) MCG/ACT inhaler Inhale 1-2 puffs into the lungs every 6 (six) hours as needed for wheezing or shortness of breath. 07/30/18   Drenda Freeze, MD     Critical care time:       Riccardo Dubin, MS4

## 2019-11-30 NOTE — Progress Notes (Addendum)
Update 5:05pm-CSW spoke with patients daughter. Supported patients daughters concerns about home stressors.Provided her with community resources. All concerns addressed.  CSW received consult for family dynamics.CSW completed assessment with patient. Patient lives at home with her husband and stepson.Patient reports that she feels good about going home and feels safe at home. Patient gave CSW permission to speak with her daughter Larene Beach about her care.CSW tried to call patients daughter.CSW awaiting call back.  CSW will continue to follow patient for any other patient needs.

## 2019-11-30 NOTE — Evaluation (Signed)
Physical Therapy Evaluation/ Discharge Patient Details Name: Lisa Poole MRN: [id number] DOB: 10-Nov-1965 Today's Date: 11/30/2019   History of Present Illness  54 yo admitted with SOB and edema with decompensated heart failure, 7/19 with cardiogenic shock and hypotension as well as Vtach. PMhx: heart block s/p PPM 10/ 2020, covid 19 4/21, NSTEMI, Left TKA, DM  Clinical Impression  PT very pleasant and sharing that she was born in [place] and lived in [place] for 25 years. Pt able to walk unit and perform transfers without assist. SpO2 dropped to 92% with gait on RA with HR stable 92-97. Pt at baseline functional status with report of fatigue with activity for the last few weeks. Pt educated for walking program and energy conservation which pt states she has been performing. Encouraged ambulation acutely. Pt verbalized understanding of all and does not currently require further acute therapy intervention. Will sign off with pt agreeable.     Follow Up Recommendations No PT follow up    Equipment Recommendations  None recommended by PT    Recommendations for Other Services       Precautions / Restrictions Precautions Precautions: None      Mobility  Bed Mobility Overal bed mobility: Independent                Transfers Overall transfer level: Independent                  Ambulation/Gait Ambulation/Gait assistance: Supervision Gait Distance (Feet): 400 Feet Assistive device: None Gait Pattern/deviations: Step-through pattern;Decreased stride length   Gait velocity interpretation: >2.62 ft/sec, indicative of community ambulatory General Gait Details: pt with 2 standing pauses when sats dropped to 92% with cues for breathing technique and activity modification. Steady gait with slightly slower speed than normal. Pt with slight limp and reports is present since TKA  Stairs            Wheelchair Mobility    Modified Rankin (Stroke Patients Only)        Balance Overall balance assessment: No apparent balance deficits (not formally assessed)                                           Pertinent Vitals/Pain Pain Assessment: No/denies pain    Home Living Family/patient expects to be discharged to:: Private residence Living Arrangements: Spouse/significant other Available Help at Discharge: Family;Available PRN/intermittently Type of Home: House Home Access: Stairs to enter Entrance Stairs-Rails: Right Entrance Stairs-Number of Steps: 3 Home Layout: One level Home Equipment: Walker - 2 wheels      Prior Function Level of Independence: Independent         Comments: works as a Scientist, water quality at [employer].     Hand Dominance        Extremity/Trunk Assessment   Upper Extremity Assessment Upper Extremity Assessment: Overall WFL for tasks assessed    Lower Extremity Assessment Lower Extremity Assessment: Overall WFL for tasks assessed    Cervical / Trunk Assessment Cervical / Trunk Assessment: Normal  Communication   Communication: No difficulties  Cognition Arousal/Alertness: Awake/alert Behavior During Therapy: WFL for tasks assessed/performed Overall Cognitive Status: Within Functional Limits for tasks assessed  General Comments      Exercises     Assessment/Plan    PT Assessment Patent does not need any further PT services  PT Problem List         PT Treatment Interventions      PT Goals (Current goals can be found in the Care Plan section)  Acute Rehab PT Goals PT Goal Formulation: All assessment and education complete, DC therapy    Frequency     Barriers to discharge        Co-evaluation               AM-PAC PT "6 Clicks" Mobility  Outcome Measure Help needed turning from your back to your side while in a flat bed without using bedrails?: None Help needed moving from lying on your back to sitting on the side of a  flat bed without using bedrails?: None Help needed moving to and from a bed to a chair (including a wheelchair)?: None Help needed standing up from a chair using your arms (e.g., wheelchair or bedside chair)?: None Help needed to walk in hospital room?: A Little Help needed climbing 3-5 steps with a railing? : None 6 Click Score: 23    End of Session   Activity Tolerance: Patient tolerated treatment well Patient left: in bed;with call bell/phone within reach Nurse Communication: Mobility status PT Visit Diagnosis: Other abnormalities of gait and mobility (R26.89)    Time: 5597-4163 PT Time Calculation (min) (ACUTE ONLY): 20 min   Charges:   PT Evaluation $PT Eval Low Complexity: 1 Low          Sura Canul P, PT Acute Rehabilitation Services Pager: [phone] Office: [place] 11/30/2019, 1:35 PM

## 2019-11-30 NOTE — Progress Notes (Signed)
Chaplain engaged in initial visit with Lisa Poole and her daughter Lisa Poole.  During visit, chaplain learned about the different family dynamics and grief [name] and her family have been enduring.  [name] trusts her daughter Lisa Poole and one of her sons to be able to make decisions on her behalf.  She has had in-depth discussions with them about the ways they can honor her if her quality of life decreases.  Both [name] and Lisa Poole have worked in Corporate treasurer and have an exceptional grasp on making preparations for changes in quality of life.  [name] also is able to recognize the unconditional love that her children hold for her that informs her decision to make them her healthcare agents.   Chaplain provided the ministries of presence and listening.  Chaplain also provided education around an Scientist, physiological.  Family plans on completing paperwork today and chaplain will follow-up tomorrow to have paperwork notarized.

## 2019-11-30 NOTE — H&P (View-Only) (Signed)
Patient ID: Lisa Poole, female   DOB: 1965/09/10, 54 y.o.   MRN: [id number]     Advanced Heart Failure Rounding Note  PCP-Cardiologist: No primary care provider on file.   Subjective:    This morning, co-ox 61% and creatinine 1.59.  MAP stable.  She is on milrinone 0.125 mcg/kg/min with Lasix gtt 10 mg/hr.   I/Os negative 1293 cc.  Breathing better.  CVP 15-16 this morning.   Still with frequent ectopy, NSVT on IV amiodarone.    Objective:   Weight Range: 88.5 kg Body mass index is 38.1 kg/m.   Vital Signs:   Temp:  [97.7 F (36.5 C)-97.9 F (36.6 C)] 97.8 F (36.6 C) (07/22 0400) Pulse Rate:  [47-114] 76 (07/22 0700) Resp:  [9-25] 15 (07/22 0700) BP: (70-126)/(40-99) 122/97 (07/22 0700) SpO2:  [89 %-100 %] 99 % (07/22 0700) Last BM Date: 11/26/19  Weight change: Filed Weights   11/26/19 1850 11/28/19 0700 11/29/19 0500  Weight: 83.9 kg 90.1 kg 88.5 kg    Intake/Output:   Intake/Output Summary (Last 24 hours) at 11/30/2019 0735 Last data filed at 11/30/2019 0600 Gross per 24 hour  Intake 1652.25 ml  Output 2945 ml  Net -1292.75 ml      Physical Exam    General: NAD Neck: JVP 12-14, no thyromegaly or thyroid nodule.  Lungs: Clear to auscultation bilaterally with normal respiratory effort. CV: Nondisplaced PMI.  Heart irregular S1/S2, no S3/S4, no murmur.  No peripheral edema.   Abdomen: Soft, nontender, no hepatosplenomegaly, no distention.  Skin: Intact without lesions or rashes.  Neurologic: Alert and oriented x 3.  Psych: Normal affect. Extremities: No clubbing or cyanosis.  HEENT: Normal.    Telemetry   NSR with frequent runs of NSVT and PVCs.  Personally reviewed.   Labs    CBC Recent Labs    11/29/19 0420 11/30/19 0350  WBC 8.0 6.7  NEUTROABS 5.1 3.2  HGB 12.0 12.5  HCT 39.1 40.9  MCV 87.7 88.3  PLT 186 150   Basic Metabolic Panel Recent Labs    11/27/19 1927 11/28/19 0215 11/28/19 0535 11/28/19 1500 11/29/19 0420  11/29/19 0750 11/29/19 1500 11/30/19 0350  NA 139   < > 138   < >   < >  --  137 135  K 4.0   < > 4.6   < >   < >  --  3.9 3.8  CL 98   < > 95*   < >   < >  --  97* 93*  CO2 27   < > 26   < >   < >  --  32 30  GLUCOSE 134*   < > 139*   < >   < >  --  239* 263*  BUN 24*   < > 28*   < >   < >  --  33* 32*  CREATININE 1.85*   < > 2.16*   < >   < >  --  1.55* 1.59*  CALCIUM 8.8*   < > 9.3   < >   < >  --  8.6* 8.4*  MG 2.3  --  1.9   < >  --  1.9  --  1.9  PHOS 3.9  --  4.4  --   --   --   --   --    < > = values in this interval not displayed.   Liver Function Tests Recent  Labs    11/28/19 0215 11/28/19 0535  AST 55* 103*  ALT 26 50*  ALKPHOS 83 85  BILITOT 2.8* 3.4*  PROT 6.5 6.6  ALBUMIN 3.2* 3.3*   No results for input(s): LIPASE, AMYLASE in the last 72 hours. Cardiac Enzymes No results for input(s): CKTOTAL, CKMB, CKMBINDEX, TROPONINI in the last 72 hours.  BNP: BNP (last 3 results) Recent Labs    11/26/19 2011  BNP 1,258.1*    ProBNP (last 3 results) No results for input(s): PROBNP in the last 8760 hours.   D-Dimer No results for input(s): DDIMER in the last 72 hours. Hemoglobin A1C Recent Labs    11/28/19 1819  HGBA1C 7.2*   Fasting Lipid Panel No results for input(s): CHOL, HDL, LDLCALC, TRIG, CHOLHDL, LDLDIRECT in the last 72 hours. Thyroid Function Tests Recent Labs    11/29/19 0420  TSH 2.164    Other results:   Imaging    No results found.   Medications:     Scheduled Medications: . aspirin EC  81 mg Oral Daily  . Chlorhexidine Gluconate Cloth  6 each Topical Daily  . digoxin  0.0625 mg Oral Daily  . enoxaparin (LOVENOX) injection  40 mg Subcutaneous QHS  . gabapentin  600 mg Oral BID  . HYDROcodone-acetaminophen  1 tablet Oral QID  . insulin aspart  0-15 Units Subcutaneous TID WC  . potassium chloride  40 mEq Oral BID  . pravastatin  10 mg Oral q1800  . sodium chloride flush  10-40 mL Intracatheter Q12H  . sodium chloride  flush  3 mL Intravenous Once  . sodium chloride flush  3 mL Intravenous Q12H  . spironolactone  12.5 mg Oral Daily  . venlafaxine XR  150 mg Oral Daily    Infusions: . sodium chloride    . sodium chloride    . amiodarone 30 mg/hr (11/30/19 0600)  . furosemide (LASIX) infusion 10 mg/hr (11/30/19 0600)  . magnesium sulfate bolus IVPB    . norepinephrine (LEVOPHED) Adult infusion      PRN Medications: sodium chloride, Place/Maintain arterial line **AND** sodium chloride, acetaminophen, ipratropium-albuterol, sodium chloride flush, sodium chloride flush    Assessment/Plan   1. Acute systolic CHF: Echo this admission with EF newly decreased to 20-25% with septal-lateral dyssynchrony and septal severe hypokinesis, normal RV, moderate TR, moderate MR. Echo in 5/20 prior to PPM placement showed EF 60-65%.  Possible causes of cardiomyopathy include chronic RV pacing, ?CAD (has RFs), ?viral myocarditis (relatively recent COVID-19 infection).  No ETOH/drugs.  She has had increased dyspnea/palpitations x several weeks. Volume overloaded and CXR suggested pulmonary edema at time of admit. Developed CGS overnight 7/20 w/ hypotension and narrow pulse pressure, w/ subsequent AKI, SCr 1.18>>1.85>>2.26. Lactic acid increased from 2.1>>5.0. Diuresis subsequently limited. She was initially on dopamine, then norepinephrine, currently milrinone 0.125.  Now with creatinine down-trending at 1.59 today with co-ox 61% today.  CVP still 15-16.  Frequent PVCs/NSVT.  - Stop milrinone and replace with norepinephrine at fixed dose 3 to see if this helps with ectopy but allows some inotropy for diuresis.  - Lasix increase today to 12 mg/hr, needs further diuresis with elevated CVP.  Replace K and Mg.  - Continue digoxin 0.0625 daily, can increase if creatinine continues to trend down.  - Add spironolactone 12.5 daily.  - Will plan RHC/LHC when more diuresed and creatinine stabilized (Tentatively tomorrow).  Need to rule  out coronary disease (has DM, HTN) as cause of cardiomyopathy. - Will need cardiac  MRI (device is compatible) when more stable => look for infiltrative disease like sarcoid (we do not know why she had CHB) as well as evidence for prior myocarditis.  Will try to do this when off inotropes and with ectopy minimized => suspect study would be technically difficult today with very frequent ectopy.  - She has left bundle pacing as an alternative to CRT => QRS in 130s.  She will need ICD upgrade, so will review with EP whether she should be upgraded as well to traditional CRT.  2. Wide complex tachycardia: VT with multiple morphologies.  Increased ventricular arrhythmias over 6 months.  ?Underlying cause.  Prior cath without coronary disease, plan to repeat.  Also consider infiltrative disease like sarcoidosis.  Still with NSVT in the setting of milrinone use.  As above, switching from milrinone to fixed low dose norepinephrine, hopefully less ectopy and will allow inotropy while diuresing.   - Replace K and Mg aggressively.  - Continue IV amiodarone.   - Will eventually need ICD upgrade.  3. H/o CHB: Cause is uncertain.  Cath in 5/20 prior to PPM showed no significant disease.  - Eventual cardiac MRI to look for infiltrative disease such as sarcoidosis.  4. AKI: In setting of hypotension.  Creatinine trending down on milrinone.   Length of Stay: 4  CRITICAL CARE Performed by: Loralie Champagne  Total critical care time: 35 minutes  Critical care time was exclusive of separately billable procedures and treating other patients.  Critical care was necessary to treat or prevent imminent or life-threatening deterioration.  Critical care was time spent personally by me on the following activities: development of treatment plan with patient and/or surrogate as well as nursing, discussions with consultants, evaluation of patient's response to treatment, examination of patient, obtaining history from patient or  surrogate, ordering and performing treatments and interventions, ordering and review of laboratory studies, ordering and review of radiographic studies, pulse oximetry and re-evaluation of patient's condition.  Loralie Champagne 11/30/2019 7:35 AM

## 2019-11-30 NOTE — Progress Notes (Signed)
Patient ID: Lisa Poole, female   DOB: [birth year], 54 y.o.   MRN: [id number]     Advanced Heart Failure Rounding Note  PCP-Cardiologist: No primary care provider on file.   Subjective:    This morning, co-ox 61% and creatinine 1.59.  MAP stable.  She is on milrinone 0.125 mcg/kg/min with Lasix gtt 10 mg/hr.   I/Os negative 1293 cc.  Breathing better.  CVP 15-16 this morning.   Still with frequent ectopy, NSVT on IV amiodarone.    Objective:   Weight Range: 88.5 kg Body mass index is 38.1 kg/m.   Vital Signs:   Temp:  [97.7 F (36.5 C)-97.9 F (36.6 C)] 97.8 F (36.6 C) (07/22 0400) Pulse Rate:  [47-114] 76 (07/22 0700) Resp:  [9-25] 15 (07/22 0700) BP: (70-126)/(40-99) 122/97 (07/22 0700) SpO2:  [89 %-100 %] 99 % (07/22 0700) Last BM Date: 11/26/19  Weight change: Filed Weights   11/26/19 1850 11/28/19 0700 11/29/19 0500  Weight: 83.9 kg 90.1 kg 88.5 kg    Intake/Output:   Intake/Output Summary (Last 24 hours) at 11/30/2019 0735 Last data filed at 11/30/2019 0600 Gross per 24 hour  Intake 1652.25 ml  Output 2945 ml  Net -1292.75 ml      Physical Exam    General: NAD Neck: JVP 12-14, no thyromegaly or thyroid nodule.  Lungs: Clear to auscultation bilaterally with normal respiratory effort. CV: Nondisplaced PMI.  Heart irregular S1/S2, no S3/S4, no murmur.  No peripheral edema.   Abdomen: Soft, nontender, no hepatosplenomegaly, no distention.  Skin: Intact without lesions or rashes.  Neurologic: Alert and oriented x 3.  Psych: Normal affect. Extremities: No clubbing or cyanosis.  HEENT: Normal.    Telemetry   NSR with frequent runs of NSVT and PVCs.  Personally reviewed.   Labs    CBC Recent Labs    11/29/19 0420 11/30/19 0350  WBC 8.0 6.7  NEUTROABS 5.1 3.2  HGB 12.0 12.5  HCT 39.1 40.9  MCV 87.7 88.3  PLT 186 626   Basic Metabolic Panel Recent Labs    11/27/19 1927 11/28/19 0215 11/28/19 0535 11/28/19 1500 11/29/19 0420  11/29/19 0750 11/29/19 1500 11/30/19 0350  NA 139   < > 138   < >   < >  --  137 135  K 4.0   < > 4.6   < >   < >  --  3.9 3.8  CL 98   < > 95*   < >   < >  --  97* 93*  CO2 27   < > 26   < >   < >  --  32 30  GLUCOSE 134*   < > 139*   < >   < >  --  239* 263*  BUN 24*   < > 28*   < >   < >  --  33* 32*  CREATININE 1.85*   < > 2.16*   < >   < >  --  1.55* 1.59*  CALCIUM 8.8*   < > 9.3   < >   < >  --  8.6* 8.4*  MG 2.3  --  1.9   < >  --  1.9  --  1.9  PHOS 3.9  --  4.4  --   --   --   --   --    < > = values in this interval not displayed.   Liver Function Tests Recent  Labs    11/28/19 0215 11/28/19 0535  AST 55* 103*  ALT 26 50*  ALKPHOS 83 85  BILITOT 2.8* 3.4*  PROT 6.5 6.6  ALBUMIN 3.2* 3.3*   No results for input(s): LIPASE, AMYLASE in the last 72 hours. Cardiac Enzymes No results for input(s): CKTOTAL, CKMB, CKMBINDEX, TROPONINI in the last 72 hours.  BNP: BNP (last 3 results) Recent Labs    11/26/19 2011  BNP 1,258.1*    ProBNP (last 3 results) No results for input(s): PROBNP in the last 8760 hours.   D-Dimer No results for input(s): DDIMER in the last 72 hours. Hemoglobin A1C Recent Labs    11/28/19 1819  HGBA1C 7.2*   Fasting Lipid Panel No results for input(s): CHOL, HDL, LDLCALC, TRIG, CHOLHDL, LDLDIRECT in the last 72 hours. Thyroid Function Tests Recent Labs    11/29/19 0420  TSH 2.164    Other results:   Imaging    No results found.   Medications:     Scheduled Medications: . aspirin EC  81 mg Oral Daily  . Chlorhexidine Gluconate Cloth  6 each Topical Daily  . digoxin  0.0625 mg Oral Daily  . enoxaparin (LOVENOX) injection  40 mg Subcutaneous QHS  . gabapentin  600 mg Oral BID  . HYDROcodone-acetaminophen  1 tablet Oral QID  . insulin aspart  0-15 Units Subcutaneous TID WC  . potassium chloride  40 mEq Oral BID  . pravastatin  10 mg Oral q1800  . sodium chloride flush  10-40 mL Intracatheter Q12H  . sodium chloride  flush  3 mL Intravenous Once  . sodium chloride flush  3 mL Intravenous Q12H  . spironolactone  12.5 mg Oral Daily  . venlafaxine XR  150 mg Oral Daily    Infusions: . sodium chloride    . sodium chloride    . amiodarone 30 mg/hr (11/30/19 0600)  . furosemide (LASIX) infusion 10 mg/hr (11/30/19 0600)  . magnesium sulfate bolus IVPB    . norepinephrine (LEVOPHED) Adult infusion      PRN Medications: sodium chloride, Place/Maintain arterial line **AND** sodium chloride, acetaminophen, ipratropium-albuterol, sodium chloride flush, sodium chloride flush    Assessment/Plan   1. Acute systolic CHF: Echo this admission with EF newly decreased to 20-25% with septal-lateral dyssynchrony and septal severe hypokinesis, normal RV, moderate TR, moderate MR. Echo in 5/20 prior to PPM placement showed EF 60-65%.  Possible causes of cardiomyopathy include chronic RV pacing, ?CAD (has RFs), ?viral myocarditis (relatively recent COVID-19 infection).  No ETOH/drugs.  She has had increased dyspnea/palpitations x several weeks. Volume overloaded and CXR suggested pulmonary edema at time of admit. Developed CGS overnight 7/20 w/ hypotension and narrow pulse pressure, w/ subsequent AKI, SCr 1.18>>1.85>>2.26. Lactic acid increased from 2.1>>5.0. Diuresis subsequently limited. She was initially on dopamine, then norepinephrine, currently milrinone 0.125.  Now with creatinine down-trending at 1.59 today with co-ox 61% today.  CVP still 15-16.  Frequent PVCs/NSVT.  - Stop milrinone and replace with norepinephrine at fixed dose 3 to see if this helps with ectopy but allows some inotropy for diuresis.  - Lasix increase today to 12 mg/hr, needs further diuresis with elevated CVP.  Replace K and Mg.  - Continue digoxin 0.0625 daily, can increase if creatinine continues to trend down.  - Add spironolactone 12.5 daily.  - Will plan RHC/LHC when more diuresed and creatinine stabilized (Tentatively tomorrow).  Need to rule  out coronary disease (has DM, HTN) as cause of cardiomyopathy. - Will need cardiac  MRI (device is compatible) when more stable => look for infiltrative disease like sarcoid (we do not know why she had CHB) as well as evidence for prior myocarditis.  Will try to do this when off inotropes and with ectopy minimized => suspect study would be technically difficult today with very frequent ectopy.  - She has left bundle pacing as an alternative to CRT => QRS in 130s.  She will need ICD upgrade, so will review with EP whether she should be upgraded as well to traditional CRT.  2. Wide complex tachycardia: VT with multiple morphologies.  Increased ventricular arrhythmias over 6 months.  ?Underlying cause.  Prior cath without coronary disease, plan to repeat.  Also consider infiltrative disease like sarcoidosis.  Still with NSVT in the setting of milrinone use.  As above, switching from milrinone to fixed low dose norepinephrine, hopefully less ectopy and will allow inotropy while diuresing.   - Replace K and Mg aggressively.  - Continue IV amiodarone.   - Will eventually need ICD upgrade.  3. H/o CHB: Cause is uncertain.  Cath in 5/20 prior to PPM showed no significant disease.  - Eventual cardiac MRI to look for infiltrative disease such as sarcoidosis.  4. AKI: In setting of hypotension.  Creatinine trending down on milrinone.   Length of Stay: 4  CRITICAL CARE Performed by: Loralie Champagne  Total critical care time: 35 minutes  Critical care time was exclusive of separately billable procedures and treating other patients.  Critical care was necessary to treat or prevent imminent or life-threatening deterioration.  Critical care was time spent personally by me on the following activities: development of treatment plan with patient and/or surrogate as well as nursing, discussions with consultants, evaluation of patient's response to treatment, examination of patient, obtaining history from patient or  surrogate, ordering and performing treatments and interventions, ordering and review of laboratory studies, ordering and review of radiographic studies, pulse oximetry and re-evaluation of patient's condition.  Loralie Champagne 11/30/2019 7:35 AM

## 2019-11-30 NOTE — Progress Notes (Addendum)
Electrophysiology Rounding Note  Patient Name: Lisa Poole Date of Encounter: 11/30/2019  Primary Cardiologist: No primary care provider on file. Electrophysiologist: Dr. Lovena Le   Subjective   The patient is doing somewhat better today. Remains volume overload. Still having ectopy and NSVT, occasionally feels her heart racing during NSVT.   Inpatient Medications    Scheduled Meds:  aspirin EC  81 mg Oral Daily   Chlorhexidine Gluconate Cloth  6 each Topical Daily   digoxin  0.0625 mg Oral Daily   enoxaparin (LOVENOX) injection  40 mg Subcutaneous QHS   gabapentin  600 mg Oral BID   HYDROcodone-acetaminophen  1 tablet Oral QID   insulin aspart  0-15 Units Subcutaneous TID WC   potassium chloride  40 mEq Oral BID   pravastatin  10 mg Oral q1800   sodium chloride flush  10-40 mL Intracatheter Q12H   sodium chloride flush  3 mL Intravenous Once   sodium chloride flush  3 mL Intravenous Q12H   spironolactone  12.5 mg Oral Daily   venlafaxine XR  150 mg Oral Daily   Continuous Infusions:  sodium chloride     sodium chloride     amiodarone 30 mg/hr (11/30/19 0700)   furosemide (LASIX) infusion 12 mg/hr (11/30/19 0756)   magnesium sulfate bolus IVPB     norepinephrine (LEVOPHED) Adult infusion 3 mcg/min (11/30/19 0803)   PRN Meds: sodium chloride, Place/Maintain arterial line **AND** sodium chloride, acetaminophen, ipratropium-albuterol, sodium chloride flush, sodium chloride flush   Vital Signs    Vitals:   11/30/19 0400 11/30/19 0500 11/30/19 0600 11/30/19 0700  BP: 103/83 (!) 107/59 (!) 122/99 (!) 122/97  Pulse: 91 (!) 59 79 76  Resp: 16 16 19 15   Temp: 97.8 F (36.6 C)     TempSrc: Oral     SpO2: 100% 99% 99% 99%  Weight:      Height:        Intake/Output Summary (Last 24 hours) at 11/30/2019 0807 Last data filed at 11/30/2019 0700 Gross per 24 hour  Intake 1668.92 ml  Output 2970 ml  Net -1301.08 ml   Filed Weights   11/26/19 1850 11/28/19 0700  11/29/19 0500  Weight: 83.9 kg 90.1 kg 88.5 kg    Physical Exam    GEN- The patient is fatigued appearing, alert and oriented x 3 today.   Head- normocephalic, atraumatic Eyes-  Sclera clear, conjunctiva pink Ears- hearing intact Oropharynx- clear Neck- supple, JVP remains elevated Lungs- Clear to ausculation bilaterally, normal work of breathing Heart- Somewhat irregular rate and rhythm due to ectopy, no murmurs, rubs or gallops GI- soft, NT, ND, + BS Extremities- no clubbing, cyanosis, or edema Skin- no rash or lesion Psych- euthymic mood, full affect Neuro- strength and sensation are intact  Labs    CBC Recent Labs    11/29/19 0420 11/30/19 0350  WBC 8.0 6.7  NEUTROABS 5.1 3.2  HGB 12.0 12.5  HCT 39.1 40.9  MCV 87.7 88.3  PLT 186 967   Basic Metabolic Panel Recent Labs    11/27/19 1927 11/28/19 0215 11/28/19 0535 11/28/19 1500 11/29/19 0420 11/29/19 0750 11/29/19 1500 11/30/19 0350  NA 139   < > 138   < >   < >  --  137 135  K 4.0   < > 4.6   < >   < >  --  3.9 3.8  CL 98   < > 95*   < >   < >  --  97* 93*  CO2 27   < > 26   < >   < >  --  32 30  GLUCOSE 134*   < > 139*   < >   < >  --  239* 263*  BUN 24*   < > 28*   < >   < >  --  33* 32*  CREATININE 1.85*   < > 2.16*   < >   < >  --  1.55* 1.59*  CALCIUM 8.8*   < > 9.3   < >   < >  --  8.6* 8.4*  MG 2.3  --  1.9   < >  --  1.9  --  1.9  PHOS 3.9  --  4.4  --   --   --   --   --    < > = values in this interval not displayed.   Liver Function Tests Recent Labs    11/28/19 0215 11/28/19 0535  AST 55* 103*  ALT 26 50*  ALKPHOS 83 85  BILITOT 2.8* 3.4*  PROT 6.5 6.6  ALBUMIN 3.2* 3.3*   No results for input(s): LIPASE, AMYLASE in the last 72 hours. Cardiac Enzymes No results for input(s): CKTOTAL, CKMB, CKMBINDEX, TROPONINI in the last 72 hours.   Telemetry    NSR with frequent runs of NSVT and PVCs (personally reviewed)  Radiology    [place] EKG SITE RITE  Result Date: 11/28/2019 If Site  Rite image not attached, placement could not be confirmed due to current cardiac rhythm.   Patient Profile        Lisa Poole is a 54 y.o. female with a history of CHB s/p Medtronic dual chamber PPM 02/2019, SCAF, aortic insuffiency, HTN, HLD, T2DM, and COVID infection 08/2019 who is being seen today for the evaluation of Wide complex tachycardia at the request of Dr. Aundra Dubin .  Assessment & Plan    1.  VT, recurrent, multiple morphologies - sustained Continues to have very frequent ectopy and NSVT on milrinone Per discussion with Dr. Caryl Comes and Dr. Aundra Dubin, plan to switch milrinone to fixed, low dose NE to hopefully reduce ectopy.  Continue IV amiodarone while on pressors.  K 3.8, Mg 1.9. Supp for goal K >3.9 and Mg >1.9. Pt may benefit from cMRI, but ? If lead in RV would make imaging and interpretation difficult.  Will need to avoid BB for now in setting of acute decompensated CHF.   2. CHB s/p MDT DDD pacer Stable device function.  Pt with HIS bundle pacing with a QRS 132-136 ms. It is unclear if her chronic pacing would still lead to a cardiomyopathy in this position.  Will likely need ICD (vs ? CRT-D) upgrade.  Plan for cMRI once more stable.    3. Acute systolic CHF,  EF down to 20-25% from 60-65% last May.  Remains volume overloaded. Continued diuresis today and likely plan for [facility] tomorrow per Dr. Aundra Dubin.  DDx for newly reduced EF includes CAD (though cath without significant CAD in 09/2018), Chronic RV pacing (though confounded by HIS bundle pacing) or post-viral from COVID infection in 08/2019   4. Hypokalemia/Hypomagnesemia K 3.8.. Goal >3.9  Mg 1.9. Goal > 1.9   5. AKI In setting of VT and acute decompensated CHF.  Cr 1.59 this am, somewhat improved.   For questions or updates, please contact [place] Please consult [email or web address] for contact info under Cardiology/STEMI.  Signed, Shirley Friar,  PA-C  11/30/2019, 8:07 AM   As above  Change  iontropes to reduce ectopy Cath and MRI ewhen possible

## 2019-12-01 ENCOUNTER — Encounter (HOSPITAL_COMMUNITY): Payer: Self-pay | Admitting: Cardiology

## 2019-12-01 ENCOUNTER — Inpatient Hospital Stay (HOSPITAL_COMMUNITY)
Admission: EM | Disposition: A | Discharge status: Home / Self Care | Payer: Self-pay | Source: Home / Self Care | Attending: Pulmonary Disease

## 2019-12-01 ENCOUNTER — Inpatient Hospital Stay (HOSPITAL_COMMUNITY): Payer: Medicare Other

## 2019-12-01 DIAGNOSIS — R570 Cardiogenic shock: Secondary | ICD-10-CM

## 2019-12-01 HISTORY — PX: IABP INSERTION: CATH118242

## 2019-12-01 HISTORY — PX: RIGHT/LEFT HEART CATH AND CORONARY ANGIOGRAPHY: CATH118266

## 2019-12-01 LAB — POCT I-STAT EG7
Acid-Base Excess: 10 mmol/L — ABNORMAL HIGH (ref 0.0–2.0)
Acid-Base Excess: 9 mmol/L — ABNORMAL HIGH (ref 0.0–2.0)
Bicarbonate: 36 mmol/L — ABNORMAL HIGH (ref 20.0–28.0)
Bicarbonate: 37.9 mmol/L — ABNORMAL HIGH (ref 20.0–28.0)
Calcium, Ion: 1.04 mmol/L — ABNORMAL LOW (ref 1.15–1.40)
Calcium, Ion: 1.12 mmol/L — ABNORMAL LOW (ref 1.15–1.40)
HCT: 48 % — ABNORMAL HIGH (ref 36.0–46.0)
HCT: 49 % — ABNORMAL HIGH (ref 36.0–46.0)
Hemoglobin: 16.3 g/dL — ABNORMAL HIGH (ref 12.0–15.0)
Hemoglobin: 16.7 g/dL — ABNORMAL HIGH (ref 12.0–15.0)
O2 Saturation: 51 %
O2 Saturation: 54 %
Potassium: 3.3 mmol/L — ABNORMAL LOW (ref 3.5–5.1)
Potassium: 3.5 mmol/L (ref 3.5–5.1)
Sodium: 141 mmol/L (ref 135–145)
Sodium: 142 mmol/L (ref 135–145)
TCO2: 38 mmol/L — ABNORMAL HIGH (ref 22–32)
TCO2: 40 mmol/L — ABNORMAL HIGH (ref 22–32)
pCO2, Ven: 58.1 mmHg (ref 44.0–60.0)
pCO2, Ven: 59.3 mmHg (ref 44.0–60.0)
pH, Ven: 7.401 (ref 7.250–7.430)
pH, Ven: 7.414 (ref 7.250–7.430)
pO2, Ven: 28 mmHg — CL (ref 32.0–45.0)
pO2, Ven: 29 mmHg — CL (ref 32.0–45.0)

## 2019-12-01 LAB — CBC WITH DIFFERENTIAL/PLATELET
Abs Immature Granulocytes: 0.03 10*3/uL (ref 0.00–0.07)
Basophils Absolute: 0.1 10*3/uL (ref 0.0–0.1)
Basophils Relative: 1 %
Eosinophils Absolute: 0.4 10*3/uL (ref 0.0–0.5)
Eosinophils Relative: 4 %
HCT: 45.8 % (ref 36.0–46.0)
Hemoglobin: 14.2 g/dL (ref 12.0–15.0)
Immature Granulocytes: 0 %
Lymphocytes Relative: 27 %
Lymphs Abs: 2.6 10*3/uL (ref 0.7–4.0)
MCH: 27.4 pg (ref 26.0–34.0)
MCHC: 31 g/dL (ref 30.0–36.0)
MCV: 88.2 fL (ref 80.0–100.0)
Monocytes Absolute: 1.2 10*3/uL — ABNORMAL HIGH (ref 0.1–1.0)
Monocytes Relative: 13 %
Neutro Abs: 5.4 10*3/uL (ref 1.7–7.7)
Neutrophils Relative %: 55 %
Platelets: 272 10*3/uL (ref 150–400)
RBC: 5.19 MIL/uL — ABNORMAL HIGH (ref 3.87–5.11)
RDW: 15.2 % (ref 11.5–15.5)
WBC: 9.7 10*3/uL (ref 4.0–10.5)
nRBC: 0.2 % (ref 0.0–0.2)

## 2019-12-01 LAB — BASIC METABOLIC PANEL
Anion gap: 12 (ref 5–15)
Anion gap: 11 (ref 5–15)
BUN: 25 mg/dL — ABNORMAL HIGH (ref 6–20)
BUN: 22 mg/dL — ABNORMAL HIGH (ref 6–20)
CO2: 28 mmol/L (ref 22–32)
CO2: 31 mmol/L (ref 22–32)
Calcium: 8.8 mg/dL — ABNORMAL LOW (ref 8.9–10.3)
Calcium: 8.3 mg/dL — ABNORMAL LOW (ref 8.9–10.3)
Chloride: 97 mmol/L — ABNORMAL LOW (ref 98–111)
Chloride: 91 mmol/L — ABNORMAL LOW (ref 98–111)
Creatinine, Ser: 1.19 mg/dL — ABNORMAL HIGH (ref 0.44–1.00)
Creatinine, Ser: 1.33 mg/dL — ABNORMAL HIGH (ref 0.44–1.00)
GFR calc Af Amer: 60 mL/min — ABNORMAL LOW (ref >60)
GFR calc Af Amer: 52 mL/min — ABNORMAL LOW (ref >60)
GFR calc non Af Amer: 51 mL/min — ABNORMAL LOW (ref >60)
GFR calc non Af Amer: 45 mL/min — ABNORMAL LOW (ref >60)
Glucose, Bld: 168 mg/dL — ABNORMAL HIGH (ref 70–99)
Glucose, Bld: 241 mg/dL — ABNORMAL HIGH (ref 70–99)
Potassium: 4.3 mmol/L (ref 3.5–5.1)
Potassium: 3.9 mmol/L (ref 3.5–5.1)
Sodium: 137 mmol/L (ref 135–145)
Sodium: 133 mmol/L — ABNORMAL LOW (ref 135–145)

## 2019-12-01 LAB — GLUCOSE, CAPILLARY
Glucose-Capillary: 156 mg/dL — ABNORMAL HIGH (ref 70–99)
Glucose-Capillary: 148 mg/dL — ABNORMAL HIGH (ref 70–99)
Glucose-Capillary: 167 mg/dL — ABNORMAL HIGH (ref 70–99)
Glucose-Capillary: 175 mg/dL — ABNORMAL HIGH (ref 70–99)

## 2019-12-01 LAB — HEPARIN LEVEL (UNFRACTIONATED): Heparin Unfractionated: 0.51 IU/mL (ref 0.30–0.70)

## 2019-12-01 LAB — MAGNESIUM: Magnesium: 1.8 mg/dL (ref 1.7–2.4)

## 2019-12-01 SURGERY — RIGHT/LEFT HEART CATH AND CORONARY ANGIOGRAPHY
Anesthesia: LOCAL

## 2019-12-01 MED ORDER — HEPARIN SODIUM (PORCINE) 1000 UNIT/ML IJ SOLN
INTRAMUSCULAR | Status: AC
  Filled 2019-12-01: qty 1

## 2019-12-01 MED ORDER — LIDOCAINE HCL (PF) 1 % IJ SOLN
INTRAMUSCULAR | Status: AC
  Filled 2019-12-01: qty 30

## 2019-12-01 MED ORDER — HEPARIN (PORCINE) IN NACL 1000-0.9 UT/500ML-% IV SOLN
INTRAVENOUS | Status: AC
  Filled 2019-12-01: qty 500

## 2019-12-01 MED ORDER — FENTANYL CITRATE (PF) 100 MCG/2ML IJ SOLN
INTRAMUSCULAR | Status: DC | PRN
  Administered 2019-12-01: 25 ug via INTRAVENOUS

## 2019-12-01 MED ORDER — MIDAZOLAM HCL 2 MG/2ML IJ SOLN
INTRAMUSCULAR | Status: DC | PRN
  Administered 2019-12-01: 1 mg via INTRAVENOUS

## 2019-12-01 MED ORDER — SODIUM CHLORIDE 0.9 % IV SOLN
INTRAVENOUS | Status: AC | PRN
  Administered 2019-12-01: 10 mL/h via INTRAVENOUS

## 2019-12-01 MED ORDER — VERAPAMIL HCL 2.5 MG/ML IV SOLN
INTRAVENOUS | Status: AC
  Filled 2019-12-01: qty 2

## 2019-12-01 MED ORDER — HEPARIN (PORCINE) IN NACL 1000-0.9 UT/500ML-% IV SOLN
INTRAVENOUS | Status: DC | PRN
  Administered 2019-12-01 (×2): 500 mL

## 2019-12-01 MED ORDER — IOHEXOL 350 MG/ML SOLN
INTRAVENOUS | Status: DC | PRN
  Administered 2019-12-01: 50 mL

## 2019-12-01 MED ORDER — MAGNESIUM SULFATE 2 GM/50ML IV SOLN
2.0000 g | Freq: Once | INTRAVENOUS | Status: AC
  Administered 2019-12-01: 2 g via INTRAVENOUS
  Filled 2019-12-01: qty 50

## 2019-12-01 MED ORDER — PREDNISONE 20 MG PO TABS: 40.0000 mg | ORAL_TABLET | Freq: Every day | ORAL | Status: DC

## 2019-12-01 MED ORDER — FENTANYL CITRATE (PF) 100 MCG/2ML IJ SOLN
INTRAMUSCULAR | Status: AC
  Filled 2019-12-01: qty 2

## 2019-12-01 MED ORDER — DIGOXIN 125 MCG PO TABS
0.1250 mg | ORAL_TABLET | Freq: Every day | ORAL | Status: DC
  Administered 2019-12-01: 0.125 mg via ORAL
  Filled 2019-12-01 (×2): qty 1

## 2019-12-01 MED ORDER — HEPARIN (PORCINE) 25000 UT/250ML-% IV SOLN
700.0000 [IU]/h | INTRAVENOUS | Status: DC
  Administered 2019-12-01: 900 [IU]/h via INTRAVENOUS
  Administered 2019-12-02: 750 [IU]/h via INTRAVENOUS
  Filled 2019-12-01 (×2): qty 250

## 2019-12-01 MED ORDER — MIDAZOLAM HCL 2 MG/2ML IJ SOLN
INTRAMUSCULAR | Status: AC
  Filled 2019-12-01: qty 2

## 2019-12-01 MED ORDER — LIDOCAINE HCL (PF) 1 % IJ SOLN
INTRAMUSCULAR | Status: DC | PRN
  Administered 2019-12-01: 2 mL
  Administered 2019-12-01: 12 mL

## 2019-12-01 MED ORDER — HYDRALAZINE HCL 25 MG PO TABS
25.0000 mg | ORAL_TABLET | Freq: Three times a day (TID) | ORAL | Status: DC
  Administered 2019-12-02: 25 mg via ORAL
  Filled 2019-12-01: qty 1

## 2019-12-01 MED ORDER — ALTEPLASE 2 MG IJ SOLR
2.0000 mg | Freq: Once | INTRAMUSCULAR | Status: AC
  Administered 2019-12-01: 2 mg
  Filled 2019-12-01: qty 2

## 2019-12-01 MED ORDER — HEPARIN SODIUM (PORCINE) 1000 UNIT/ML IJ SOLN
INTRAMUSCULAR | Status: DC | PRN
  Administered 2019-12-01: 4500 [IU] via INTRAVENOUS

## 2019-12-01 SURGICAL SUPPLY — 14 items
BALLN IABP SENSA PLUS 7.5F 40C (BALLOONS) ×2
BALLOON IABP SENS PLUS 7.5F40C (BALLOONS) IMPLANT
CATH INFINITI 5FR MULTPACK ANG (CATHETERS) ×1 IMPLANT
CATH SWAN GANZ 7F STRAIGHT (CATHETERS) ×1 IMPLANT
GLIDESHEATH SLEND SS 6F .021 (SHEATH) ×1 IMPLANT
KIT HEART LEFT (KITS) ×2 IMPLANT
PACK CARDIAC CATHETERIZATION (CUSTOM PROCEDURE TRAY) ×2 IMPLANT
SHEATH PINNACLE 5F 10CM (SHEATH) ×1 IMPLANT
SHEATH PINNACLE 7F 10CM (SHEATH) ×1 IMPLANT
SHEATH PROBE COVER 6X72 (BAG) ×1 IMPLANT
SLEEVE REPOSITIONING LENGTH 30 (MISCELLANEOUS) ×1 IMPLANT
TRANSDUCER W/STOPCOCK (MISCELLANEOUS) ×2 IMPLANT
WIRE EMERALD 3MM-J .025X260CM (WIRE) ×1 IMPLANT
WIRE EMERALD 3MM-J .035X150CM (WIRE) ×1 IMPLANT

## 2019-12-01 NOTE — Progress Notes (Signed)
ANTICOAGULATION CONSULT NOTE - Initial Consult  Pharmacy Consult for heparin Indication: IABP  Allergies  Allergen Reactions  . Lisinopril Other (See Comments) and Cough    Flu like symptoms, also   . Ace Inhibitors Other (See Comments)    Flu-like symptoms  . Augmentin [Amoxicillin-Pot Clavulanate] Nausea And Vomiting  . Benazepril Other (See Comments)    Flu like symptoms  . Lotensin [Benazepril Hcl] Other (See Comments)    Flu like symptoms  . Oxycodone-Acetaminophen Nausea And Vomiting  . Sulfa Antibiotics Nausea And Vomiting    Patient Measurements: Height: 5' (152.4 cm) Weight: 88.7 kg (195 lb 8.8 oz) IBW/kg (Calculated) : 45.5  Vital Signs: Temp: 97.8 F (36.6 C) (07/23 1625) Temp Source: Oral (07/23 1625) BP: 107/77 (07/23 1800) Pulse Rate: 71 (07/23 1800)  Labs: Recent Labs    11/29/19 0420 11/29/19 1500 11/30/19 0350 11/30/19 0350 12/01/19 0328 12/01/19 0328 12/01/19 0844 12/01/19 0845 12/01/19 1809  HGB 12.0  --  12.5   < > 14.2   < > 16.3* 16.7*  --   HCT 39.1  --  40.9   < > 45.8  --  48.0* 49.0*  --   PLT 186  --  204  --  272  --   --   --   --   HEPARINUNFRC  --   --   --   --   --   --   --   --  0.51  CREATININE 1.85*   < > 1.59*  --  1.19*  --   --   --  1.33*   < > = values in this interval not displayed.    Estimated Creatinine Clearance: 47.4 mL/min (A) (by C-G formula based on SCr of 1.33 mg/dL (H)).   Medical History: Past Medical History:  Diagnosis Date  . Aortic insufficiency   . Asthma 09/13/2006  . Complete heart block ([place]) 02/11/2019  . Cystocele without uterine prolapse 12/12/2018  . Diabetes mellitus without complication ([place])   . Elevated troponin level   . Hypercholesteremia   . Hypertension   . Primary osteoarthritis of left knee 12/10/2017     Assessment: 54yof admitted with VT, s/p cath lab with normal coronaries but high pressures and low cardiac output - IABP placed   Heparin level 0.51, slightly high  Goal  of Therapy:  Heparin level 0.2-0.5 units/ml Monitor platelets by anticoagulation protocol: Yes   Plan:  Decrease Heparin drip to 850 uts/hr Will recheck heparin level with am labs CBC, HL daily   Alanda Slim, PharmD, [facility] Clinical Pharmacist Please see AMION for all Pharmacists' Contact Phone Numbers 12/01/2019, 7:32 PM

## 2019-12-01 NOTE — Plan of Care (Signed)
Pt is alert and oriented and verbalizes understanding of heart cath later on this morning. Pt is on room air and nasal cannula as needed. Pt tolerates activity well. Pt continues to have occasional PVCs but continues to deny chest pain. Pt has had good urine output. CVP is 17-19. Pt voices no concerns or complaints at this time. Call bell is within reach and bed is in lowest position.  PICC line this morning continues to flush well but does not pull back blood, therefore, co-ox was not drawn. Lab was called to draw morning labwork.  Problem: Education: Goal: Knowledge of General Education information will improve Description: Including pain rating scale, medication(s)/side effects and non-pharmacologic comfort measures Outcome: Progressing   Problem: Clinical Measurements: Goal: Cardiovascular complication will be avoided Outcome: Progressing   Problem: Coping: Goal: Level of anxiety will decrease Outcome: Progressing   Problem: Elimination: Goal: Will not experience complications related to urinary retention Outcome: Progressing

## 2019-12-01 NOTE — Interval H&P Note (Signed)
History and Physical Interval Note:  12/01/2019 8:06 AM  Lisa Poole  has presented today for surgery, with the diagnosis of heart failure.  The various methods of treatment have been discussed with the patient and family. After consideration of risks, benefits and other options for treatment, the patient has consented to  Procedure(s): RIGHT/LEFT HEART CATH AND CORONARY ANGIOGRAPHY (N/A) as a surgical intervention.  The patient's history has been reviewed, patient examined, no change in status, stable for surgery.  I have reviewed the patient's chart and labs.  Questions were answered to the patient's satisfaction.     Jayleen Scaglione [employer]

## 2019-12-01 NOTE — Progress Notes (Addendum)
Electrophysiology Rounding Note  Patient Name: Lisa Poole Date of Encounter: 12/01/2019  Primary Cardiologist: No primary care provider on file. Electrophysiologist: Dr. Lovena Le   Subjective   The patient is feeling OK this am. She is pending L/RHC. Still with volume overload and some SOB. Ectopy has overall improved on NE.   Inpatient Medications    Scheduled Meds:  aspirin EC  81 mg Oral Daily   Chlorhexidine Gluconate Cloth  6 each Topical Daily   digoxin  0.125 mg Oral Daily   gabapentin  600 mg Oral BID   hydrALAZINE  25 mg Oral Q8H   HYDROcodone-acetaminophen  1 tablet Oral QID   insulin aspart  0-15 Units Subcutaneous TID WC   potassium chloride  40 mEq Oral BID   pravastatin  10 mg Oral q1800   sodium chloride flush  10-40 mL Intracatheter Q12H   sodium chloride flush  3 mL Intravenous Once   sodium chloride flush  3 mL Intravenous Q12H   sodium chloride flush  3 mL Intravenous Q12H   spironolactone  12.5 mg Oral Daily   venlafaxine XR  150 mg Oral Daily   Continuous Infusions:  sodium chloride     sodium chloride     amiodarone 30 mg/hr (12/01/19 0700)   furosemide (LASIX) infusion 15 mg/hr (12/01/19 1021)   heparin 900 Units/hr (12/01/19 1400)   norepinephrine (LEVOPHED) Adult infusion Stopped (12/01/19 1030)   PRN Meds: sodium chloride, Place/Maintain arterial line **AND** sodium chloride, acetaminophen, ipratropium-albuterol, sodium chloride flush, sodium chloride flush   Vital Signs    Vitals:   12/01/19 1109 12/01/19 1200 12/01/19 1300 12/01/19 1400  BP:  (!) 151/88 91/74 (!) 126/99  Pulse:  73 72 74  Resp:  (!) 29 (!) 11 17  Temp: (!) 97.5 F (36.4 C)     TempSrc: Axillary     SpO2:  93% 93% 93%  Weight:      Height:        Intake/Output Summary (Last 24 hours) at 12/01/2019 1438 Last data filed at 12/01/2019 1400 Gross per 24 hour  Intake 2007.68 ml  Output 6640 ml  Net -4632.32 ml   Filed Weights   11/28/19 0700 11/29/19 0500  12/01/19 0346  Weight: 90.1 kg 88.5 kg 88.7 kg    Physical Exam    General:  Fatigued appearing. No resp difficulty. HEENT: Normal Neck: Supple. JVP elevated. Carotids 2+ bilat; no bruits. No thyromegaly or nodule noted. Cor: PMI nondisplaced. Somewhat irregular with ectopy, No M/G/R noted Lungs: CTAB, normal effort. Abdomen: Soft, non-tender, non-distended, no HSM. No bruits or masses. +BS   Extremities: No cyanosis, clubbing, or rash. R and LLE no edema.  Neuro: Alert & orientedx3, cranial nerves grossly intact. moves all 4 extremities w/o difficulty. Affect pleasant   Labs    CBC Recent Labs    11/30/19 0350 12/01/19 0328  WBC 6.7 9.7  NEUTROABS 3.2 5.4  HGB 12.5 14.2  HCT 40.9 45.8  MCV 88.3 88.2  PLT 204 762   Basic Metabolic Panel Recent Labs    11/30/19 0350 12/01/19 0328  NA 135 137  K 3.8 4.3  CL 93* 97*  CO2 30 28  GLUCOSE 263* 168*  BUN 32* 25*  CREATININE 1.59* 1.19*  CALCIUM 8.4* 8.8*  MG 1.9 1.8   Liver Function Tests No results for input(s): AST, ALT, ALKPHOS, BILITOT, PROT, ALBUMIN in the last 72 hours. No results for input(s): LIPASE, AMYLASE in the last 72 hours.  Cardiac Enzymes No results for input(s): CKTOTAL, CKMB, CKMBINDEX, TROPONINI in the last 72 hours.   Telemetry    NSR with continued runs of NSVT and PVCs; overall improved NE. Increased burden overnight with SOB, but again improved this am. (personally reviewed)  Radiology    CARDIAC CATHETERIZATION  Result Date: 12/01/2019 1. Elevated right and left heart filling pressures. 2. Low cardiac output on norepinephrine 3. 3. Mixed pulmonary venous/pulmonary arterial hypertension. 4. No significant coronary disease. 5. Successful IABP placement.   DG Chest Port 1 View  Result Date: 12/01/2019 CLINICAL DATA:  Follow-up intra-aortic balloon pump EXAM: PORTABLE CHEST 1 VIEW COMPARISON:  11/26/2019 FINDINGS: Cardiac shadow is enlarged. Pacing device is again seen. New intra-aortic  balloon pump is noted just below the aortic knob. The lungs are clear. Right-sided PICC line is seen in satisfactory position. Swan-Ganz catheter is noted extending from the femoral vein into the right pulmonary artery. IMPRESSION: Tubes and lines as described above.  No acute abnormality noted. Electronically Signed   By: Inez Catalina M.D.   On: 12/01/2019 10:36    Patient Profile        Lisa Poole is a 54 y.o. female with a history of CHB s/p Medtronic dual chamber PPM 02/2019, SCAF, aortic insuffiency, HTN, HLD, T2DM, and COVID infection 08/2019 who is being seen today for the evaluation of Wide complex tachycardia at the request of Dr. Aundra Dubin .  Assessment & Plan    1.  VT, recurrent, multiple morphologies - sustained Continues to have relatively frequent ectopy, but overall improved off milrinone and on levophed.  Continue IV amiodarone while on pressors.  K 4.3 and Mg 1.8. Supp for goal K >3.9 and Mg >1.9. Pt may benefit from cMRI, but ? If lead in RV would make imaging and interpretation difficult.  Will need to avoid BB for now in setting of acute decompensated CHF.   2. CHB s/p MDT DDD pacer Stable device function Pt with HIS bundle pacing with a QRS 132-136 ms. It is unclear if her chronic pacing would still lead to a cardiomyopathy in this position.  Will need ICD upgrade in setting of VT. MDs to discuss if traditional CRT upgrade is warranted.  Eventual cMRI.    3. Acute systolic CHF,  EF down to 20-25% from 60-65% last May.  For R/LHC this am.  DDx for newly reduced EF includes CAD (though cath without significant CAD in 09/2018), Chronic RV pacing (though confounded by HIS bundle pacing) or post-viral from COVID infection in 08/2019   4. Hypokalemia/Hypomagnesemia Goal K >3.9 and Mg > 1.9 K stable, supp Mg.   5. AKI In setting of VT and acute decompensated CHF.  Cr 1.19 this am. Plan for [facility] per Dr. Aundra Dubin   ADDENDUM: [facility] 12/01/19 with: 1. Elevated L/R heart  filling pressures 2. Low cardiac output despite NE 3 3. Mixed pulmonary venous/arterial HTN 4. No significant CAD 5. Successful IABP placement.   For questions or updates, please contact [place] Please consult [email or web address] for contact info under Cardiology/STEMI.  Signed, Shirley Friar, PA-C  12/01/2019   As above Will need cMRI  Continue amio

## 2019-12-01 NOTE — Progress Notes (Signed)
NAME:  Lisa Poole, MRN:  [id number], DOB:  [birth year], LOS: 5 ADMISSION DATE:  11/26/2019, CONSULTATION DATE:  11/28/2019 REFERRING MD:  Charissa Bash, MD  CHIEF COMPLAINT: Cardiogenic shock  Brief History   Patient transferred to ICU for cardiogenic shock.  History of present illness   Ms.Bare presented to emergency department on 7/18 for evaluation of shortness of breath, lower extremity swelling, and not feeling right.  Patient had signs of worsening heart failure including orthopnea, weight gain, elevated BNP, and pulmonary edema on chest x-ray.  She had episodes of V. tach 7/19 and cardiology was consulted and TTE ordered.    Of note , patient has a pmhx of complete heart block s/p medtronic dual chamber pacemaker 02/2019. No specific etiology for causing complete heart block was identified. She also has history of Covid-19 infection 08/2019, not requiring hospitilization.   TTE showed an EF 20 to 25% compared to echo in 2020 which showed EF 60 - 65%.  The echo also showed regional wall motion abnormalities (anterior, septal and lateral).  Moderate to severe TR with elevated RVSP,  and moderate MR. She was being dirueesed and started on amiodarone for NSVT.Overnight patient developed cardiogenic shock with hypotension and narrow pulse pressure with lactic acidosis and AKI. Dopamine was started and BP improved. With recurrent episodes of NSVT patient was transferred to ICU.     Denies any cough, fever, difficulty urinating before admission, rashes, joint pain, or recent changes in bowl movements.  Past Medical History  Past Medical History: No date: Aortic insufficiency 09/13/2006: Asthma 02/11/2019: Complete heart block ([place]) 12/12/2018: Cystocele without uterine prolapse No date: Diabetes mellitus without complication (HCC) No date: Elevated troponin level No date: Hypercholesteremia No date: Hypertension 12/10/2017: Primary osteoarthritis of left knee  Significant Hospital Events     7/18 admit 7/19 cardiogenic shock 7/20 transferred to from ED to ICU Consults:  EP cardiology HF cardiology Procedures:  7/23: RHC/LHC and coronary angiography 7/23: IABP   Significant Diagnostic Tests:  TTE: EF severely reduced , 20-25%, compared to 60-65% one year ago  Micro Data:  MRSA PCR: Negative SARS coronavirus 2: Negative Antimicrobials:  N/A  Interim history/subjective:  7/23: Patient taken to cath lab this morning for RHC/LHC with coronary angiography. No significant CAD and IABP placed. Patient awakens on exam but doses back off. RN reports she was just given pain medication after procedure.   7/22: NAEON. Milrinone discontinued in interval. Patient started on stable infusion of norepinephrine. Lasix drip increased to 12 this AM. Started on spironolactone 12.5 daily.  She reports feeling the same today as she did yesterday. She was able go without supplemental oxygen for much of yesterday but was placed back on 2L [place] overnight.  Objective   Blood pressure (!) 127/86, pulse 86, temperature 98.3 F (36.8 C), temperature source Oral, resp. rate 21, height 5' (1.524 m), weight 88.7 kg, last menstrual period 07/25/2015, SpO2 93 %. CVP:  [17 mmHg-21 mmHg] 17 mmHg      Intake/Output Summary (Last 24 hours) at 12/01/2019 0929 Last data filed at 12/01/2019 0700 Gross per 24 hour  Intake 1787.85 ml  Output 3430 ml  Net -1642.15 ml   Filed Weights   11/28/19 0700 11/29/19 0500 12/01/19 0346  Weight: 90.1 kg 88.5 kg 88.7 kg    Examination: General: NAD, well-developed HE: Normocephalic, atraumatic , EOMI, Conjunctivae normal ENT: MMM. No congestion, no rhinorrhea , trachea midline  Cardiovascular: RRR, no murmurs, rubs, or gallops Pulmonary : CTAB.  Normal respiratory effort.   Abdominal: soft, bowel sounds present Musculoskeletal: No edema. No deformity, injury  Skin: Warm, dry , no bruising, erythema, or rash Psychiatric/Behavioral:  normal mood, normal  behavior     K 4.3, Cr 1.19 - down trending , Hgb 14.2, WBC wnl Resolved Hospital Problem list   Cardiogenic shock  Assessment & Plan:  #Acute systolic heart failure Patient presents with heart failure symptoms which have developed over a month  And acutely worsened in the past 2 weeks. Found to have a new reduce EF compared to one month ago. 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 Has risk factors for CAD of hypertension, hyperlipidemia and diabetes but no signs of significant CAD on LHC . Pacemaker mediated tachycardia is most likely diagnosis, but will need to rule out infiltrative heart disease. No history of recent travel to suggest [place] disease as possible etiology. - RHC/LHC 7/23 show no significant CAD .IABP placed and plans to stay in over the weekend. Plan for continued diuresis.   Plan: - F/u Serum ace level  - Cardiac MRI when stable to rule out infiltrative heart disease -HF cardiology adjusting diuretics and HF meds, Lasix '15mg'$ /hr, Increased Digoxin to .125, Spirolactone. - HF cards and EP cards will be discussing ICD upgrade and possible CRT  #Ventricular tachycardia #Hx of complete heart block s/p medtronic dual chamber pacemaker Milrinone switched to norepinephrine due to continued persistent ectopy.  Plan: -EP Cardiology following  -Continue amiodarone -Continue digoxin .125 -Goal K > 4, Mg > 2   #AKI Creatinine continues to improve. 1.19 today. Baseline appears to be ~1.  Plan: - Improving with treatment of HF, treatment as above - Monitor I/O's - Trend renal indices   #T2DM Diabetes controlled , Hgb A1c 7, on Metformin. -Glucose at hospital goal. Will continue on current insulin regimen. Plan: -SSI -M -monitor CBG  Best practice:  Diet: Low sodium DVT prophylaxis: Heparin Glucose control: SSI Mobility: up with assistance Code Status: Full Family Communication: patient is up-to-date on  care Disposition: ICU  Labs   CBC: Recent Labs  Lab 11/26/19 1908 11/28/19 0612 11/29/19 0420 11/30/19 0350 12/01/19 0328  WBC 8.3 10.5 8.0 6.7 9.7  NEUTROABS  --   --  5.1 3.2 5.4  HGB 13.8 14.8 12.0 12.5 14.2  HCT 46.1* 48.3* 39.1 40.9 45.8  MCV 90.2 86.9 87.7 88.3 88.2  PLT 311 194 186 204 161    Basic Metabolic Panel: Recent Labs  Lab 11/26/19 2012 11/27/19 0402 11/27/19 1927 11/28/19 0215 11/28/19 0535 11/28/19 1500 11/28/19 2202 11/29/19 0420 11/29/19 0750 11/29/19 1500 11/30/19 0350 12/01/19 0328  NA  --    < > 139   < > 138   < > 137 137  --  137 135 137  K  --    < > 4.0   < > 4.6   < > 3.6 3.8  --  3.9 3.8 4.3  CL  --    < > 98   < > 95*   < > 98 97*  --  97* 93* 97*  CO2  --    < > 27   < > 26   < > 30 32  --  32 30 28  GLUCOSE  --    < > 134*   < > 139*   < > 199* 211*  --  239* 263* 168*  BUN  --    < > 24*   < >  28*   < > 37* 35*  --  33* 32* 25*  CREATININE  --    < > 1.85*   < > 2.16*   < > 1.95* 1.85*  --  1.55* 1.59* 1.19*  CALCIUM  --    < > 8.8*   < > 9.3   < > 8.4* 8.5*  --  8.6* 8.4* 8.8*  MG 1.7  --  2.3  --  1.9  --  2.2  --  1.9  --  1.9 1.8  PHOS 3.5  --  3.9  --  4.4  --   --   --   --   --   --   --    < > = values in this interval not displayed.   GFR: Estimated Creatinine Clearance: 53 mL/min (A) (by C-G formula based on SCr of 1.19 mg/dL (H)). Recent Labs  Lab 11/26/19 1908 11/28/19 0535 11/28/19 0612 11/28/19 0813 11/28/19 1413 11/28/19 1713 11/29/19 0420 11/30/19 0350 12/01/19 0328  WBC   < >  --  10.5  --   --   --  8.0 6.7 9.7  LATICACIDVEN  --  4.0*  --  2.5* 2.1* 2.2*  --   --   --    < > = values in this interval not displayed.    Liver Function Tests: Recent Labs  Lab 11/28/19 0215 11/28/19 0535  AST 55* 103*  ALT 26 50*  ALKPHOS 83 85  BILITOT 2.8* 3.4*  PROT 6.5 6.6  ALBUMIN 3.2* 3.3*   No results for input(s): LIPASE, AMYLASE in the last 168 hours. No results for input(s): AMMONIA in the last 168  hours.  ABG    Component Value Date/Time   O2SAT 61.0 11/30/2019 0448     Coagulation Profile: No results for input(s): INR, PROTIME in the last 168 hours.  Cardiac Enzymes: No results for input(s): CKTOTAL, CKMB, CKMBINDEX, TROPONINI in the last 168 hours.  HbA1C: Hgb A1c MFr Bld  Date/Time Value Ref Range Status  11/28/2019 06:19 PM 7.2 (H) 4.8 - 5.6 % Final    Comment:    (NOTE) Pre diabetes:          5.7%-6.4%  Diabetes:              >6.4%  Glycemic control for   <7.0% adults with diabetes   11/27/2019 04:02 AM 7.1 (H) 4.8 - 5.6 % Final    Comment:    (NOTE) Pre diabetes:          5.7%-6.4%  Diabetes:              >6.4%  Glycemic control for   <7.0% adults with diabetes     CBG: Recent Labs  Lab 11/30/19 0805 11/30/19 1132 11/30/19 1608 11/30/19 2153 12/01/19 0543  GLUCAP 180* 191* 174* 101* 156*    Review of Systems:   Complete review of systems otherwise normal unless mentioned under HPI  Past Medical History  She,  has a past medical history of Aortic insufficiency, Asthma (09/13/2006), Complete heart block ([place]) (02/11/2019), Cystocele without uterine prolapse (12/12/2018), Diabetes mellitus without complication ([place]), Elevated troponin level, Hypercholesteremia, Hypertension, and Primary osteoarthritis of left knee (12/10/2017).   Surgical History    Past Surgical History:  Procedure Laterality Date   APPENDECTOMY     LEFT HEART CATH AND CORONARY ANGIOGRAPHY N/A 09/28/2018   Procedure: LEFT HEART CATH AND CORONARY ANGIOGRAPHY;  Surgeon: Troy Sine, MD;  Location: [facility]  INVASIVE CV LAB;  Service: Cardiovascular;  Laterality: N/A;   PACEMAKER IMPLANT N/A 02/13/2019   Procedure: PACEMAKER IMPLANT;  Surgeon: Evans Lance, MD;  Location: [facility] CV LAB;  Service: Cardiovascular;  Laterality: N/A;   PACEMAKER IMPLANT     ROTATOR CUFF REPAIR Bilateral 2017   SINUS EXPLORATION     TOTAL KNEE ARTHROPLASTY Left 12/10/2017   Procedure: LEFT  TOTAL KNEE ARTHROPLASTY;  Surgeon: Sydnee Cabal, MD;  Location: [facility] ORS;  Service: Orthopedics;  Laterality: Left;  Adductor Block   TUBAL LIGATION       Social History   reports that she has never smoked. She has never used smokeless tobacco. She reports that she does not drink alcohol and does not use drugs.   Family History   Her family history includes Alzheimer's disease in her mother; Colon cancer in her maternal grandmother.   Allergies Allergies  Allergen Reactions   Lisinopril Other (See Comments) and Cough    Flu like symptoms, also    Ace Inhibitors Other (See Comments)    Flu-like symptoms   Augmentin [Amoxicillin-Pot Clavulanate] Nausea And Vomiting   Benazepril Other (See Comments)    Flu like symptoms   Lotensin [Benazepril Hcl] Other (See Comments)    Flu like symptoms   Oxycodone-Acetaminophen Nausea And Vomiting   Sulfa Antibiotics Nausea And Vomiting     Home Medications  Prior to Admission medications   Medication Sig Start Date End Date Taking? Authorizing Provider  aspirin EC 81 MG tablet Take 81 mg by mouth daily. Swallow whole.   Yes [provider]  Biotin 10 MG CAPS Take 10 mg by mouth daily.   Yes [provider]  cetirizine (ZYRTEC) 10 MG tablet Take 10 mg by mouth daily.   Yes [provider]  Cholecalciferol (VITAMIN D-3) 25 MCG (1000 UT) CAPS Take 1,000 Units by mouth daily.    Yes [provider]  gabapentin (NEURONTIN) 600 MG tablet Take 600 mg by mouth 2 (two) times daily.   Yes [provider]  hydrochlorothiazide (HYDRODIURIL) 25 MG tablet Take 25 mg by mouth daily. 09/25/19  Yes [provider]  HYDROcodone-acetaminophen (NORCO) 10-325 MG tablet Take 1 tablet by mouth 4 (four) times daily.    Yes [provider]  losartan (COZAAR) 25 MG tablet Take 0.5 tablets (12.5 mg total) by mouth daily. 09/29/18  Yes Reino Bellis B, NP  lovastatin (MEVACOR) 10 MG tablet Take 10 mg  by mouth at bedtime.    Yes [provider]  meloxicam (MOBIC) 15 MG tablet Take 15 mg by mouth daily. 02/05/19  Yes [provider]  metFORMIN (GLUCOPHAGE) 500 MG tablet Take 500 mg by mouth 2 (two) times daily. 07/26/19  Yes [provider]  metoprolol tartrate (LOPRESSOR) 25 MG tablet TAKE 1 TABLET BY MOUTH TWICE DAILY Patient taking differently: Take 25 mg by mouth 2 (two) times daily.  07/17/19  Yes Shirley Friar, PA-C  Multiple Vitamins-Minerals (CENTRUM SILVER 50+WOMEN) TABS Take 1 tablet by mouth daily.   Yes [provider]  naloxone (NARCAN) 4 MG/0.1ML LIQD nasal spray kit Place 1 spray into the nose as directed.   Yes [provider]  omeprazole (PRILOSEC) 40 MG capsule Take 40 mg by mouth daily.   Yes [provider]  ondansetron (ZOFRAN ODT) 4 MG disintegrating tablet Take 1 tablet (4 mg total) by mouth every 8 (eight) hours as needed for nausea or vomiting. 10/20/19  Yes Lucrezia Starch,  MD  venlafaxine XR (EFFEXOR-XR) 150 MG 24 hr capsule Take 150 mg by mouth daily. 05/07/19  Yes [provider]  acetaminophen (TYLENOL) 325 MG tablet Take 1-2 tablets (325-650 mg total) by mouth every 4 (four) hours as needed for mild pain. Patient not taking: Reported on 11/26/2019 02/14/19   Shirley Friar, PA-C  albuterol (PROVENTIL HFA;VENTOLIN HFA) 108 [phone] Base) MCG/ACT inhaler Inhale 1-2 puffs into the lungs every 6 (six) hours as needed for wheezing or shortness of breath. 07/30/18   Drenda Freeze, MD     Critical care time:       Tamsen Snider, MD PGY2

## 2019-12-01 NOTE — Progress Notes (Signed)
ANTICOAGULATION CONSULT NOTE - Initial Consult  Pharmacy Consult for heparin Indication: IABP  Allergies  Allergen Reactions  . Lisinopril Other (See Comments) and Cough    Flu like symptoms, also   . Ace Inhibitors Other (See Comments)    Flu-like symptoms  . Augmentin [Amoxicillin-Pot Clavulanate] Nausea And Vomiting  . Benazepril Other (See Comments)    Flu like symptoms  . Lotensin [Benazepril Hcl] Other (See Comments)    Flu like symptoms  . Oxycodone-Acetaminophen Nausea And Vomiting  . Sulfa Antibiotics Nausea And Vomiting    Patient Measurements: Height: 5' (152.4 cm) Weight: 88.7 kg (195 lb 8.8 oz) IBW/kg (Calculated) : 45.5  Vital Signs: Temp: 97.5 F (36.4 C) (07/23 1109) Temp Source: Axillary (07/23 1109) BP: 146/92 (07/23 1015) Pulse Rate: 74 (07/23 1108)  Labs: Recent Labs    11/29/19 0420 11/29/19 0420 11/29/19 1500 11/30/19 0350 12/01/19 0328  HGB 12.0   < >  --  12.5 14.2  HCT 39.1  --   --  40.9 45.8  PLT 186  --   --  204 272  CREATININE 1.85*   < > 1.55* 1.59* 1.19*   < > = values in this interval not displayed.    Estimated Creatinine Clearance: 53 mL/min (A) (by C-G formula based on SCr of 1.19 mg/dL (H)).   Medical History: Past Medical History:  Diagnosis Date  . Aortic insufficiency   . Asthma 09/13/2006  . Complete heart block ([place]) 02/11/2019  . Cystocele without uterine prolapse 12/12/2018  . Diabetes mellitus without complication ([place])   . Elevated troponin level   . Hypercholesteremia   . Hypertension   . Primary osteoarthritis of left knee 12/10/2017     Assessment: 54yof admitted with VT, s/p cath lab with normal coronaries but high pressures and low cardiac output - IABP placed  Will start low dose heparin drip.  Goal of Therapy:  Heparin level 0.2-0.5 units/ml Monitor platelets by anticoagulation protocol: Yes   Plan:  Heparin drip 900 uts/hr Draw HL 6hr after drip started and then daily CBC, HL daily   Bonnita Nasuti Pharm.D. CPP, BCPS Clinical Pharmacist [phone] 12/01/2019 11:28 AM

## 2019-12-01 NOTE — Progress Notes (Signed)
Patient ID: Lisa Poole, female   DOB: Feb 25, 1966, 54 y.o.   MRN: [id number]     Advanced Heart Failure Rounding Note  PCP-Cardiologist: No primary care provider on file.   Subjective:    Patient diuresed well yesterday, I/Os negative -1191.  Creatinine down to 1.19.   Less ectopy on norepinephrine 3 compared to milrinone.    RHC/LHC:  Coronary Findings  Diagnostic Dominance: Right Left Main  No significant coronary disease.  Left Anterior Descending  Luminal irregularities.  Left Circumflex  30% proximal stenosis.  Right Coronary Artery  High anterior take-off. No significant coronary disease.  Intervention  No interventions have been documented. Right Heart  Right Heart Pressures RHC Procedural Findings (norepinephrine 5): Hemodynamics (mmHg) RA mean 19 RV 57/19 PA 64/29, mean 45 PCWP mean 27 LV 135/28 AO 133/85  Oxygen saturations: PA 53% AO 99%  Cardiac Output (Fick) 2.77  Cardiac Index (Fick) 1.5  Cardiac Output (Thermo) 3.11 Cardiac Index (Thermo) 1.68  PVR 5.8 WU PAPI 1.8    Objective:   Weight Range: 88.7 kg Body mass index is 38.19 kg/m.   Vital Signs:   Temp:  [96.4 F (35.8 C)-98.3 F (36.8 C)] 98.3 F (36.8 C) (07/23 0346) Pulse Rate:  [47-101] 86 (07/23 0700) Resp:  [0-33] 21 (07/23 0700) BP: (102-137)/(77-109) 127/86 (07/23 0918) SpO2:  [88 %-100 %] 93 % (07/23 0756) Weight:  [88.7 kg] 88.7 kg (07/23 0346) Last BM Date: 11/29/19 (per pt)  Weight change: Filed Weights   11/28/19 0700 11/29/19 0500 12/01/19 0346  Weight: 90.1 kg 88.5 kg 88.7 kg    Intake/Output:   Intake/Output Summary (Last 24 hours) at 12/01/2019 1009 Last data filed at 12/01/2019 0940 Gross per 24 hour  Intake 1547.85 ml  Output 4930 ml  Net -3382.15 ml      Physical Exam    General: NAD Neck: JVP 12 cm, no thyromegaly or thyroid nodule.  Lungs: Clear to auscultation bilaterally with normal respiratory effort. CV: Nondisplaced PMI.  Heart  regular S1/S2, no S3/S4, no murmur.  No peripheral edema.   Abdomen: Soft, nontender, no hepatosplenomegaly, no distention.  Skin: Intact without lesions or rashes.  Neurologic: Alert and oriented x 3.  Psych: Normal affect. Extremities: No clubbing or cyanosis.  HEENT: Normal.    Telemetry   NSR with PVCs.  Personally reviewed.   Labs    CBC Recent Labs    11/30/19 0350 12/01/19 0328  WBC 6.7 9.7  NEUTROABS 3.2 5.4  HGB 12.5 14.2  HCT 40.9 45.8  MCV 88.3 88.2  PLT 204 478   Basic Metabolic Panel Recent Labs    11/30/19 0350 12/01/19 0328  NA 135 137  K 3.8 4.3  CL 93* 97*  CO2 30 28  GLUCOSE 263* 168*  BUN 32* 25*  CREATININE 1.59* 1.19*  CALCIUM 8.4* 8.8*  MG 1.9 1.8   Liver Function Tests No results for input(s): AST, ALT, ALKPHOS, BILITOT, PROT, ALBUMIN in the last 72 hours. No results for input(s): LIPASE, AMYLASE in the last 72 hours. Cardiac Enzymes No results for input(s): CKTOTAL, CKMB, CKMBINDEX, TROPONINI in the last 72 hours.  BNP: BNP (last 3 results) Recent Labs    11/26/19 2011  BNP 1,258.1*    ProBNP (last 3 results) No results for input(s): PROBNP in the last 8760 hours.   D-Dimer No results for input(s): DDIMER in the last 72 hours. Hemoglobin A1C Recent Labs    11/28/19 1819  HGBA1C 7.2*   Fasting  Lipid Panel No results for input(s): CHOL, HDL, LDLCALC, TRIG, CHOLHDL, LDLDIRECT in the last 72 hours. Thyroid Function Tests Recent Labs    11/29/19 0420  TSH 2.164    Other results:   Imaging    CARDIAC CATHETERIZATION  Result Date: 12/01/2019 1. Elevated right and left heart filling pressures. 2. Low cardiac output on norepinephrine 3. 3. Mixed pulmonary venous/pulmonary arterial hypertension. 4. No significant coronary disease. 5. Successful IABP placement.     Medications:     Scheduled Medications: . aspirin EC  81 mg Oral Daily  . Chlorhexidine Gluconate Cloth  6 each Topical Daily  . digoxin  0.125 mg  Oral Daily  . enoxaparin (LOVENOX) injection  40 mg Subcutaneous QHS  . gabapentin  600 mg Oral BID  . HYDROcodone-acetaminophen  1 tablet Oral QID  . insulin aspart  0-15 Units Subcutaneous TID WC  . potassium chloride  40 mEq Oral BID  . pravastatin  10 mg Oral q1800  . sodium chloride flush  10-40 mL Intracatheter Q12H  . sodium chloride flush  3 mL Intravenous Once  . sodium chloride flush  3 mL Intravenous Q12H  . sodium chloride flush  3 mL Intravenous Q12H  . spironolactone  12.5 mg Oral Daily  . venlafaxine XR  150 mg Oral Daily    Infusions: . sodium chloride    . sodium chloride    . amiodarone 30 mg/hr (12/01/19 0700)  . furosemide (LASIX) infusion 12 mg/hr (12/01/19 0700)  . norepinephrine (LEVOPHED) Adult infusion 1 mcg/min (12/01/19 0916)    PRN Medications: sodium chloride, Place/Maintain arterial line **AND** sodium chloride, acetaminophen, ipratropium-albuterol, sodium chloride flush, sodium chloride flush    Assessment/Plan   1. Acute systolic CHF: Nonischemic cardiomyopathy.  Echo this admission with EF newly decreased to 20-25% with septal-lateral dyssynchrony and septal severe hypokinesis, normal RV, moderate TR, moderate MR. Echo in 5/20 prior to PPM placement showed EF 60-65%.  Possible causes of cardiomyopathy include chronic RV pacing, frequent NSVT/PVCs, ?CAD (has RFs), ?viral myocarditis (relatively recent COVID-19 infection).  No ETOH/drugs.  She has had increased dyspnea/palpitations x several weeks. Volume overloaded and CXR suggested pulmonary edema at time of admit. Developed CGS overnight 7/20 w/ hypotension and narrow pulse pressure, w/ subsequent AKI, SCr 1.18>>1.85>>2.26. Lactic acid increased from 2.1>>5.0. Diuresis subsequently limited. She was initially on dopamine, then norepinephrine, milrinone, and back to norepinephrine.  Least ventricular ectopy on norepinephrine.  LHC/RHC this morning with no significant CAD, elevated filling pressures, and  low cardiac output on norepinephrine 3.  Creatinine has been trending down (1.1 today) and she has diuresed well on Lasix gtt.  IABP placed today with low output and VT/increased PVCs with milrinone. - IABP at 1:1.  - Decrease norepinephrine to 1, can stop if cardiac output remains stable.   - Lasix increase today to 15 mg/hr, needs further diuresis with elevated PCWP and CVP.  Replace K and Mg.  - Increase digoxin to 0.125 daily.  - Continue spironolactone.   - Will need cardiac MRI (device is compatible) when more stable and IABP out => look for infiltrative disease like sarcoid (we do not know why she had CHB) as well as evidence for prior myocarditis.   - She has left bundle pacing as an alternative to CRT => QRS in 130s.  She will need ICD upgrade, so will review with EP whether she should be upgraded as well to traditional CRT.  - Will continue diuresis with IABP over weekend, will need  to try to wean likely early next week. Cardiomyopathy may be related to frequent VT/PVCs.  2. Wide complex tachycardia: VT with multiple morphologies.  Increased ventricular arrhythmias over 6 months.  ?Underlying cause.  No significant coronary disease.  Consider infiltrative disease like sarcoidosis.  Increased VT runs with milrinone and dopamine, off both, did better with norepinephrine.  Now with IABP.    - EP following.  - Replace K and Mg aggressively.  - Continue IV amiodarone.   - Will eventually need ICD upgrade.  3. H/o CHB: Cause is uncertain.  Cath in 5/20 prior to PPM showed no significant disease.  - Eventual cardiac MRI to look for infiltrative disease such as sarcoidosis.  4. AKI: In setting of hypotension.  Creatinine trending down with hemodynamic support.   Length of Stay: 5  CRITICAL CARE Performed by: Loralie Champagne  Total critical care time: 35 minutes  Critical care time was exclusive of separately billable procedures and treating other patients.  Critical care was necessary to  treat or prevent imminent or life-threatening deterioration.  Critical care was time spent personally by me on the following activities: development of treatment plan with patient and/or surrogate as well as nursing, discussions with consultants, evaluation of patient's response to treatment, examination of patient, obtaining history from patient or surrogate, ordering and performing treatments and interventions, ordering and review of laboratory studies, ordering and review of radiographic studies, pulse oximetry and re-evaluation of patient's condition.  Loralie Champagne 12/01/2019 10:09 AM

## 2019-12-02 ENCOUNTER — Inpatient Hospital Stay (HOSPITAL_COMMUNITY): Payer: Medicare Other

## 2019-12-02 DIAGNOSIS — I5021 Acute systolic (congestive) heart failure: Secondary | ICD-10-CM

## 2019-12-02 DIAGNOSIS — I428 Other cardiomyopathies: Secondary | ICD-10-CM

## 2019-12-02 LAB — GLUCOSE, CAPILLARY
Glucose-Capillary: 143 mg/dL — ABNORMAL HIGH (ref 70–99)
Glucose-Capillary: 208 mg/dL — ABNORMAL HIGH (ref 70–99)
Glucose-Capillary: 217 mg/dL — ABNORMAL HIGH (ref 70–99)
Glucose-Capillary: 225 mg/dL — ABNORMAL HIGH (ref 70–99)
Glucose-Capillary: 255 mg/dL — ABNORMAL HIGH (ref 70–99)

## 2019-12-02 LAB — COOXEMETRY PANEL
Carboxyhemoglobin: 1.3 % (ref 0.5–1.5)
Carboxyhemoglobin: 1.9 % — ABNORMAL HIGH (ref 0.5–1.5)
Methemoglobin: 0.6 % (ref 0.0–1.5)
Methemoglobin: 0.9 % (ref 0.0–1.5)
O2 Saturation: 56.1 %
O2 Saturation: 58.6 %
Total hemoglobin: 13.5 g/dL (ref 12.0–16.0)
Total hemoglobin: 14.4 g/dL (ref 12.0–16.0)

## 2019-12-02 LAB — POCT I-STAT 7, (LYTES, BLD GAS, ICA,H+H)
Acid-Base Excess: 16 mmol/L — ABNORMAL HIGH (ref 0.0–2.0)
Bicarbonate: 41.9 mmol/L — ABNORMAL HIGH (ref 20.0–28.0)
Calcium, Ion: 1.06 mmol/L — ABNORMAL LOW (ref 1.15–1.40)
HCT: 42 % (ref 36.0–46.0)
Hemoglobin: 14.3 g/dL (ref 12.0–15.0)
O2 Saturation: 100 %
Patient temperature: 97.8
Potassium: 2.4 mmol/L — CL (ref 3.5–5.1)
Sodium: 137 mmol/L (ref 135–145)
TCO2: 43 mmol/L — ABNORMAL HIGH (ref 22–32)
pCO2 arterial: 51.1 mmHg — ABNORMAL HIGH (ref 32.0–48.0)
pH, Arterial: 7.52 — ABNORMAL HIGH (ref 7.350–7.450)
pO2, Arterial: 482 mmHg — ABNORMAL HIGH (ref 83.0–108.0)

## 2019-12-02 LAB — BASIC METABOLIC PANEL
Anion gap: 12 (ref 5–15)
Anion gap: 12 (ref 5–15)
Anion gap: 9 (ref 5–15)
BUN: 21 mg/dL — ABNORMAL HIGH (ref 6–20)
BUN: 20 mg/dL (ref 6–20)
BUN: 19 mg/dL (ref 6–20)
CO2: 34 mmol/L — ABNORMAL HIGH (ref 22–32)
CO2: 33 mmol/L — ABNORMAL HIGH (ref 22–32)
CO2: 34 mmol/L — ABNORMAL HIGH (ref 22–32)
Calcium: 8.8 mg/dL — ABNORMAL LOW (ref 8.9–10.3)
Calcium: 8.8 mg/dL — ABNORMAL LOW (ref 8.9–10.3)
Calcium: 9.1 mg/dL (ref 8.9–10.3)
Chloride: 93 mmol/L — ABNORMAL LOW (ref 98–111)
Chloride: 92 mmol/L — ABNORMAL LOW (ref 98–111)
Chloride: 93 mmol/L — ABNORMAL LOW (ref 98–111)
Creatinine, Ser: 1.17 mg/dL — ABNORMAL HIGH (ref 0.44–1.00)
Creatinine, Ser: 1.04 mg/dL — ABNORMAL HIGH (ref 0.44–1.00)
Creatinine, Ser: 1 mg/dL (ref 0.44–1.00)
GFR calc Af Amer: >60 mL/min (ref >60)
GFR calc Af Amer: >60 mL/min (ref >60)
GFR calc Af Amer: >60 mL/min (ref >60)
GFR calc non Af Amer: 52 mL/min — ABNORMAL LOW (ref >60)
GFR calc non Af Amer: >60 mL/min (ref >60)
GFR calc non Af Amer: >60 mL/min (ref >60)
Glucose, Bld: 109 mg/dL — ABNORMAL HIGH (ref 70–99)
Glucose, Bld: 266 mg/dL — ABNORMAL HIGH (ref 70–99)
Glucose, Bld: 234 mg/dL — ABNORMAL HIGH (ref 70–99)
Potassium: 3.7 mmol/L (ref 3.5–5.1)
Potassium: 3.1 mmol/L — ABNORMAL LOW (ref 3.5–5.1)
Potassium: 4.9 mmol/L (ref 3.5–5.1)
Sodium: 139 mmol/L (ref 135–145)
Sodium: 137 mmol/L (ref 135–145)
Sodium: 136 mmol/L (ref 135–145)

## 2019-12-02 LAB — CBC WITH DIFFERENTIAL/PLATELET
Abs Immature Granulocytes: 0.01 10*3/uL (ref 0.00–0.07)
Basophils Absolute: 0.1 10*3/uL (ref 0.0–0.1)
Basophils Relative: 1 %
Eosinophils Absolute: 0.3 10*3/uL (ref 0.0–0.5)
Eosinophils Relative: 5 %
HCT: 43.1 % (ref 36.0–46.0)
Hemoglobin: 13.3 g/dL (ref 12.0–15.0)
Immature Granulocytes: 0 %
Lymphocytes Relative: 31 %
Lymphs Abs: 2.1 10*3/uL (ref 0.7–4.0)
MCH: 26.8 pg (ref 26.0–34.0)
MCHC: 30.9 g/dL (ref 30.0–36.0)
MCV: 86.9 fL (ref 80.0–100.0)
Monocytes Absolute: 0.7 10*3/uL (ref 0.1–1.0)
Monocytes Relative: 10 %
Neutro Abs: 3.6 10*3/uL (ref 1.7–7.7)
Neutrophils Relative %: 53 %
Platelets: 215 10*3/uL (ref 150–400)
RBC: 4.96 MIL/uL (ref 3.87–5.11)
RDW: 15.1 % (ref 11.5–15.5)
WBC: 6.8 10*3/uL (ref 4.0–10.5)
nRBC: 0 % (ref 0.0–0.2)

## 2019-12-02 LAB — CBC
HCT: 45.4 % (ref 36.0–46.0)
Hemoglobin: 13.9 g/dL (ref 12.0–15.0)
MCH: 26.6 pg (ref 26.0–34.0)
MCHC: 30.6 g/dL (ref 30.0–36.0)
MCV: 87 fL (ref 80.0–100.0)
Platelets: 250 10*3/uL (ref 150–400)
RBC: 5.22 MIL/uL — ABNORMAL HIGH (ref 3.87–5.11)
RDW: 15 % (ref 11.5–15.5)
WBC: 8.7 10*3/uL (ref 4.0–10.5)
nRBC: 0 % (ref 0.0–0.2)

## 2019-12-02 LAB — HEPARIN LEVEL (UNFRACTIONATED)
Heparin Unfractionated: 0.55 IU/mL (ref 0.30–0.70)
Heparin Unfractionated: 0.38 IU/mL (ref 0.30–0.70)

## 2019-12-02 LAB — MAGNESIUM
Magnesium: 1.8 mg/dL (ref 1.7–2.4)
Magnesium: 1.7 mg/dL (ref 1.7–2.4)
Magnesium: 2.9 mg/dL — ABNORMAL HIGH (ref 1.7–2.4)

## 2019-12-02 LAB — PHOSPHORUS
Phosphorus: 2.8 mg/dL (ref 2.5–4.6)
Phosphorus: 2.5 mg/dL (ref 2.5–4.6)

## 2019-12-02 MED ORDER — INSULIN ASPART 100 UNIT/ML ~~LOC~~ SOLN
0.0000 [IU] | SUBCUTANEOUS | Status: DC
  Administered 2019-12-02: 8 [IU] via SUBCUTANEOUS
  Administered 2019-12-02: 5 [IU] via SUBCUTANEOUS
  Administered 2019-12-03: 11 [IU] via SUBCUTANEOUS
  Administered 2019-12-03 – 2019-12-04 (×3): 5 [IU] via SUBCUTANEOUS
  Administered 2019-12-04: 3 [IU] via SUBCUTANEOUS

## 2019-12-02 MED ORDER — MIDAZOLAM HCL 2 MG/2ML IJ SOLN: 1.0000 mg | INTRAMUSCULAR | Status: DC | PRN

## 2019-12-02 MED ORDER — PROSOURCE TF PO LIQD
45.0000 mL | Freq: Two times a day (BID) | ORAL | Status: DC
  Administered 2019-12-02: 45 mL
  Filled 2019-12-02: qty 45

## 2019-12-02 MED ORDER — FENTANYL CITRATE (PF) 100 MCG/2ML IJ SOLN: 25.0000 ug | Freq: Once | INTRAMUSCULAR | Status: AC

## 2019-12-02 MED ORDER — POTASSIUM CHLORIDE 20 MEQ/15ML (10%) PO SOLN
40.0000 meq | Freq: Two times a day (BID) | ORAL | Status: DC
  Administered 2019-12-02 – 2019-12-03 (×3): 40 meq
  Filled 2019-12-02 (×3): qty 30

## 2019-12-02 MED ORDER — PANTOPRAZOLE SODIUM 40 MG PO PACK
40.0000 mg | PACK | Freq: Every day | ORAL | Status: DC
  Administered 2019-12-02 – 2019-12-03 (×2): 40 mg
  Filled 2019-12-02 (×2): qty 20

## 2019-12-02 MED ORDER — DOCUSATE SODIUM 50 MG/5ML PO LIQD: 100.0000 mg | Freq: Two times a day (BID) | ORAL | Status: DC

## 2019-12-02 MED ORDER — ORAL CARE MOUTH RINSE
15.0000 mL | OROMUCOSAL | Status: DC
  Administered 2019-12-02 – 2019-12-03 (×7): 15 mL via OROMUCOSAL

## 2019-12-02 MED ORDER — MAGNESIUM SULFATE 4 GM/100ML IV SOLN
4.0000 g | Freq: Once | INTRAVENOUS | Status: AC
  Administered 2019-12-02: 4 g via INTRAVENOUS
  Filled 2019-12-02: qty 100

## 2019-12-02 MED ORDER — VITAL HIGH PROTEIN PO LIQD
1000.0000 mL | ORAL | Status: DC
  Administered 2019-12-02: 1000 mL

## 2019-12-02 MED ORDER — FENTANYL CITRATE (PF) 100 MCG/2ML IJ SOLN
50.0000 ug | Freq: Once | INTRAMUSCULAR | Status: AC
  Administered 2019-12-04: 50 ug via INTRAVENOUS
  Filled 2019-12-02: qty 2

## 2019-12-02 MED ORDER — DIGOXIN 125 MCG PO TABS
0.1250 mg | ORAL_TABLET | Freq: Every day | ORAL | Status: DC
  Administered 2019-12-02 – 2019-12-03 (×2): 0.125 mg
  Filled 2019-12-02: qty 1

## 2019-12-02 MED ORDER — ROCURONIUM BROMIDE 10 MG/ML (PF) SYRINGE
PREFILLED_SYRINGE | INTRAVENOUS | Status: AC
  Filled 2019-12-02: qty 10

## 2019-12-02 MED ORDER — POLYETHYLENE GLYCOL 3350 17 G PO PACK
17.0000 g | PACK | Freq: Every day | ORAL | Status: DC
  Administered 2019-12-02 – 2019-12-04 (×3): 17 g via ORAL
  Filled 2019-12-02 (×5): qty 1

## 2019-12-02 MED ORDER — SPIRONOLACTONE 12.5 MG HALF TABLET
12.5000 mg | ORAL_TABLET | Freq: Every day | ORAL | Status: DC
  Administered 2019-12-03: 12.5 mg
  Filled 2019-12-02: qty 1

## 2019-12-02 MED ORDER — VITAL HIGH PROTEIN PO LIQD: 1000.0000 mL | ORAL | Status: DC

## 2019-12-02 MED ORDER — FENTANYL CITRATE (PF) 100 MCG/2ML IJ SOLN
INTRAMUSCULAR | Status: AC
  Administered 2019-12-02: 100 ug via INTRAVENOUS
  Filled 2019-12-02: qty 2

## 2019-12-02 MED ORDER — DOCUSATE SODIUM 50 MG/5ML PO LIQD
100.0000 mg | Freq: Two times a day (BID) | ORAL | Status: DC
  Administered 2019-12-02 – 2019-12-03 (×3): 100 mg
  Filled 2019-12-02 (×3): qty 10

## 2019-12-02 MED ORDER — MAGNESIUM SULFATE 2 GM/50ML IV SOLN: 2.0000 g | Freq: Once | INTRAVENOUS | Status: DC

## 2019-12-02 MED ORDER — POTASSIUM CHLORIDE 20 MEQ/15ML (10%) PO SOLN
40.0000 meq | Freq: Once | ORAL | Status: AC
  Administered 2019-12-02: 40 meq via ORAL
  Filled 2019-12-02: qty 30

## 2019-12-02 MED ORDER — LIDOCAINE IN D5W 4-5 MG/ML-% IV SOLN
1.0000 mg/min | INTRAVENOUS | Status: DC
  Administered 2019-12-02 – 2019-12-03 (×2): 1 mg/min via INTRAVENOUS
  Filled 2019-12-02 (×2): qty 500

## 2019-12-02 MED ORDER — MIDAZOLAM HCL 2 MG/2ML IJ SOLN
INTRAMUSCULAR | Status: AC
  Administered 2019-12-02: 2 mg via INTRAVENOUS
  Filled 2019-12-02: qty 4

## 2019-12-02 MED ORDER — POTASSIUM CHLORIDE 10 MEQ/50ML IV SOLN
10.0000 meq | INTRAVENOUS | Status: AC
  Administered 2019-12-02 (×3): 10 meq via INTRAVENOUS
  Filled 2019-12-02 (×3): qty 50

## 2019-12-02 MED ORDER — MIDAZOLAM HCL 2 MG/2ML IJ SOLN
2.0000 mg | INTRAMUSCULAR | Status: DC | PRN
  Filled 2019-12-02: qty 2

## 2019-12-02 MED ORDER — FENTANYL 2500MCG IN NS 250ML (10MCG/ML) PREMIX INFUSION
50.0000 ug/h | INTRAVENOUS | Status: DC
  Administered 2019-12-02: 50 ug/h via INTRAVENOUS
  Filled 2019-12-02: qty 250

## 2019-12-02 MED ORDER — HYDRALAZINE HCL 10 MG PO TABS
5.0000 mg | ORAL_TABLET | Freq: Three times a day (TID) | ORAL | Status: DC
  Administered 2019-12-02 – 2019-12-03 (×4): 5 mg
  Filled 2019-12-02 (×4): qty 1

## 2019-12-02 MED ORDER — PRAVASTATIN SODIUM 10 MG PO TABS
10.0000 mg | ORAL_TABLET | Freq: Every day | ORAL | Status: DC
  Administered 2019-12-02: 10 mg
  Filled 2019-12-02: qty 1

## 2019-12-02 MED ORDER — CHLORHEXIDINE GLUCONATE 0.12% ORAL RINSE (MEDLINE KIT)
15.0000 mL | Freq: Two times a day (BID) | OROMUCOSAL | Status: DC
  Administered 2019-12-02 – 2019-12-07 (×7): 15 mL via OROMUCOSAL

## 2019-12-02 MED ORDER — MIDAZOLAM HCL 2 MG/2ML IJ SOLN
1.0000 mg | INTRAMUSCULAR | Status: DC | PRN
  Administered 2019-12-03: 1 mg via INTRAVENOUS
  Filled 2019-12-02: qty 2

## 2019-12-02 MED ORDER — PHENYLEPHRINE 40 MCG/ML (10ML) SYRINGE FOR IV PUSH (FOR BLOOD PRESSURE SUPPORT)
PREFILLED_SYRINGE | INTRAVENOUS | Status: AC
  Filled 2019-12-02: qty 10

## 2019-12-02 MED ORDER — HYDROCODONE-ACETAMINOPHEN 10-325 MG PO TABS
1.0000 | ORAL_TABLET | Freq: Four times a day (QID) | ORAL | Status: DC
  Administered 2019-12-02: 1
  Filled 2019-12-02: qty 1

## 2019-12-02 MED ORDER — FENTANYL BOLUS VIA INFUSION
50.0000 ug | INTRAVENOUS | Status: DC | PRN
  Filled 2019-12-02: qty 50

## 2019-12-02 MED ORDER — ASPIRIN 81 MG PO CHEW
81.0000 mg | CHEWABLE_TABLET | Freq: Every day | ORAL | Status: DC
  Administered 2019-12-02 – 2019-12-03 (×2): 81 mg
  Filled 2019-12-02 (×2): qty 1

## 2019-12-02 MED ORDER — MIDAZOLAM HCL 2 MG/2ML IJ SOLN
2.0000 mg | INTRAMUSCULAR | Status: DC | PRN
  Administered 2019-12-02 (×2): 2 mg via INTRAVENOUS
  Filled 2019-12-02: qty 2

## 2019-12-02 MED ORDER — GABAPENTIN 600 MG PO TABS
600.0000 mg | ORAL_TABLET | Freq: Two times a day (BID) | ORAL | Status: DC
  Administered 2019-12-02 – 2019-12-03 (×3): 600 mg
  Filled 2019-12-02 (×2): qty 1

## 2019-12-02 MED ORDER — MAGNESIUM SULFATE 2 GM/50ML IV SOLN
2.0000 g | Freq: Once | INTRAVENOUS | Status: AC
  Administered 2019-12-02: 2 g via INTRAVENOUS
  Filled 2019-12-02: qty 50

## 2019-12-02 MED ORDER — INSULIN ASPART 100 UNIT/ML ~~LOC~~ SOLN
0.0000 [IU] | SUBCUTANEOUS | Status: DC
  Administered 2019-12-02: 5 [IU] via SUBCUTANEOUS

## 2019-12-02 MED ORDER — ETOMIDATE 2 MG/ML IV SOLN
INTRAVENOUS | Status: AC
  Filled 2019-12-02: qty 20

## 2019-12-02 MED ORDER — METHYLPREDNISOLONE SODIUM SUCC 40 MG IJ SOLR
40.0000 mg | Freq: Two times a day (BID) | INTRAMUSCULAR | Status: DC
  Administered 2019-12-02 – 2019-12-04 (×5): 40 mg via INTRAVENOUS
  Filled 2019-12-02 (×5): qty 1

## 2019-12-02 MED FILL — Medication: Qty: 1 | Status: AC

## 2019-12-02 NOTE — Progress Notes (Signed)
Initial Nutrition Assessment  DOCUMENTATION CODES:   Obesity unspecified  INTERVENTION:  D/c ProSource TF  Continue Vital High Protein @ 40 ml/hr, increase 10 ml every 6 hrs to goal rate 50 ml/hr (1200 ml/day) via OGT -Provides 1200 kcal, 105 grams protein, and 1008 ml free water  Monitor magnesium, potassium, and phosphorus daily for at least 3 days, MD to replete as needed.   NUTRITION DIAGNOSIS:   Inadequate oral intake related to inability to eat as evidenced by NPO status.  GOAL:   Provide needs based on ASPEN/SCCM guidelines   MONITOR:   Vent status, I & O's, Labs, TF tolerance, Weight trends  REASON FOR ASSESSMENT:   Consult Enteral/tube feeding initiation and management  ASSESSMENT:  RD working remotely.  54 year old female admitted for acute decompensated heart failure after presenting with on and off worsening shortness of breath over the past couple of weeks, bilateral lower extremity swelling, decreased appetite due to early satiety, and 12 lb wt gain in the last week. Past medical history significant for NSTEMI, complete heart block s/p pacemaker, DM2, HTN, and HLD.  7/18 - Admit 7/24 - Intubated  Patient developed recurrent VT/torsades x 3, had brief CPR and shocked, subsequently intubated.   Admit wt  198.63 lb   Current wt 186.34 lb Net -12,879 ml since admit   -5622 ml x 24 hr UOP: 8260 ml x 24 hrs Non-pitting BLE per RN assessment  Patient is currently intubated and sedated on ventilator support MV: 5.9  L/min Temp (24hrs), Avg:97.8 F (36.6 C), Min:96.6 F (35.9 C), Max:98.6 F (37 C)  Propofol: none ml/hr Medications reviewed and include: Digoxin, Colace, Miralax, Gabapentin, SSI, Methylprednisolone, KCl NaCl 10 ml/hr Lasix 250 mg in D5 @ 15 ml/hr (61 kcal) Fentanyl Heparin Cardiac 2000 mg in D5 @ 15 ml/hr (61 kcal) Mg sulfate KCl Labs: CBG 208, K 2.4 (L), Mg 1.7 (WNL) trending down  NUTRITION - FOCUSED PHYSICAL EXAM: Unable to  complete at this time, RD working remotely.  Diet Order:   Diet Order            Diet heart healthy/carb modified Room service appropriate? Yes; Fluid consistency: Thin  Diet effective now                 EDUCATION NEEDS:   No education needs have been identified at this time  Skin:  Skin Assessment: Reviewed RN Assessment  Last BM:  7/22  Height:   Ht Readings from Last 1 Encounters:  11/26/19 5' (1.524 m)    Weight:   Wt Readings from Last 1 Encounters:  12/02/19 84.7 kg    Ideal Body Weight:  45.5 kg  BMI:  Body mass index is 36.47 kg/m.  Estimated Nutritional Needs:   Kcal:  0347-4259  Protein:  91  Fluid:  >/= 1.2 L/day   Lajuan Lines, RD, LDN Clinical Nutrition After Hours/Weekend Pager # in [place]

## 2019-12-02 NOTE — Significant Event (Signed)
At approximately 08:57, Ms. Whittier became unresponsive with snoring respirations.  She was found to be in VT and pulseless.  CPR was subsequently initiated and she was defibrillated at 08:58.  After defibrillation, CPR was resumed and during the pulse check, she was found to have achieved ROSC.  She was A&O x PPTE w/o neurological deficits.  She requested that her husband be contacted and he was notified that she wanted him to come to the hospital.  At approx. 9:18, pt went back into VT, was defibrillated, and then regained consciousness.   She went into VT again at 09:20, was defibrillated again, and regained consciousness.    Dr. Lynetta Mare discussed intubation with her due to multiple defibrillations and increased stress.  Pt agreed and she was subsequently intubated.  Pt's husband arrived shortly after pt was intubated.  Will continue to monitor.

## 2019-12-02 NOTE — Progress Notes (Signed)
[place] for heparin Indication: IABP  Allergies  Allergen Reactions  . Amiodarone Other (See Comments)    Torsades on amiodarone   . Lisinopril Other (See Comments) and Cough    Flu like symptoms, also   . Ace Inhibitors Other (See Comments)    Flu-like symptoms  . Augmentin [Amoxicillin-Pot Clavulanate] Nausea And Vomiting  . Benazepril Other (See Comments)    Flu like symptoms  . Lotensin [Benazepril Hcl] Other (See Comments)    Flu like symptoms  . Oxycodone-Acetaminophen Nausea And Vomiting  . Sulfa Antibiotics Nausea And Vomiting    Patient Measurements: Height: 5' (152.4 cm) Weight: 84.7 kg (186 lb 11.7 oz) IBW/kg (Calculated) : 45.5  Vital Signs: Temp: 97.3 F (36.3 C) (07/24 1400) Temp Source: Oral (07/24 0356) BP: 142/80 (07/24 1200) Pulse Rate: 81 (07/24 1400)  Labs: Recent Labs    12/01/19 0328 12/01/19 0844 12/01/19 0845 12/01/19 1809 12/02/19 0339 12/02/19 0339 12/02/19 0926 12/02/19 1106 12/02/19 1323  HGB 14.2   < >   < >  --  13.3   < > 13.9 14.3  --   HCT 45.8   < >   < >  --  43.1  --  45.4 42.0  --   PLT 272  --   --   --  215  --  250  --   --   HEPARINUNFRC  --   --   --  0.51 0.55  --   --   --  0.38  CREATININE 1.19*  --   --  1.33* 1.17*  --  1.04*  --   --    < > = values in this interval not displayed.    Estimated Creatinine Clearance: 59.1 mL/min (A) (by C-G formula based on SCr of 1.04 mg/dL (H)).   Medical History: Past Medical History:  Diagnosis Date  . Aortic insufficiency   . Asthma 09/13/2006  . Complete heart block ([place]) 02/11/2019  . Cystocele without uterine prolapse 12/12/2018  . Diabetes mellitus without complication ([place])   . Elevated troponin level   . Hypercholesteremia   . Hypertension   . Primary osteoarthritis of left knee 12/10/2017   Assessment: 54yof admitted with VT, s/p cath lab with normal coronaries but high pressures and low cardiac output - IABP  placed.  Had episodes of torsades/VT this morning - now intubated. Heparin level came back this afternoon therapeutic at 0.38, on 750 units/hr. Hgb 14.3, plt 250. No s/sx of bleeding.   Goal of Therapy:  Heparin level 0.2-0.5 units/ml Monitor platelets by anticoagulation protocol: Yes   Plan:  Continue heparin drip at 750 units/hr  Will recheck heparin level with am labs CBC, HL daily  Antonietta Jewel, PharmD, [place] Pharmacist  Phone: [phone] 12/02/2019 2:53 PM  Please check AMION for all [place] phone numbers After 10:00 PM, call [facility] [phone]

## 2019-12-02 NOTE — Progress Notes (Signed)
Patient ID: Lisa Poole, female   DOB: 05/28/1965, 54 y.o.   MRN: [id number]     Advanced Heart Failure Rounding Note  PCP-Cardiologist: No primary care provider on file.   Subjective:    Remains on IABP 1:1  Developed recurrent VT/torsades x 3 today. Had brief CPR and shocked. QT > 633m   D/w Drs. AAndree Moroat bedside  Subsequently intubated. A-paced at 100. Amio switched to lidocaine. Mag supped  Co-ox 59% CVP 10-11    Objective:   Weight Range: 84.7 kg Body mass index is 36.47 kg/m.   Vital Signs:   Temp:  [96.6 F (35.9 C)-98.6 F (37 C)] 96.6 F (35.9 C) (07/24 0754) Pulse Rate:  [34-100] 100 (07/24 0937) Resp:  [9-29] 16 (07/24 0937) BP: (79-163)/(58-118) 129/77 (07/24 0800) SpO2:  [90 %-100 %] 98 % (07/24 0937) FiO2 (%):  [100 %] 100 % (07/24 0937) Weight:  [84.7 kg] 84.7 kg (07/24 0600) Last BM Date: 11/30/19  Weight change: Filed Weights   11/29/19 0500 12/01/19 0346 12/02/19 0600  Weight: 88.5 kg 88.7 kg 84.7 kg    Intake/Output:   Intake/Output Summary (Last 24 hours) at 12/02/2019 1000 Last data filed at 12/02/2019 0800 Gross per 24 hour  Intake 2676.27 ml  Output 7735 ml  Net -5058.73 ml      Physical Exam    General:  Intubated/sedated HEENT: normal Neck: supple. JVP to jawCarotids 2+ bilat; no bruits. No lymphadenopathy or thryomegaly appreciated. Cor: PMI nondisplaced. Irregular rate & rhythm. No rubs, gallops or murmurs. Lungs: clear Abdomen: obese soft, nontender, nondistended. No hepatosplenomegaly. No bruits or masses. Good bowel sounds. Extremities: no cyanosis, clubbing, rash, 1+ edema IABP site ok Neuro: intubated sedated    Telemetry   NSR with polymorphic PVCs with several episodes VT/torsades.  Personally reviewed  Labs    CBC Recent Labs    12/01/19 0328 12/01/19 0844 12/02/19 0339 12/02/19 0926  WBC 9.7  --  6.8 8.7  NEUTROABS 5.4  --  3.6  --   HGB 14.2   < > 13.3 13.9  HCT 45.8   < > 43.1 45.4    MCV 88.2  --  86.9 87.0  PLT 272  --  215 250   < > = values in this interval not displayed.   Basic Metabolic Panel Recent Labs    12/01/19 0328 12/01/19 0844 12/01/19 1809 12/02/19 0339  NA 137   < > 133* 139  K 4.3   < > 3.9 3.7  CL 97*  --  91* 93*  CO2 28  --  31 34*  GLUCOSE 168*  --  241* 109*  BUN 25*  --  22* 21*  CREATININE 1.19*  --  1.33* 1.17*  CALCIUM 8.8*  --  8.3* 8.8*  MG 1.8  --   --  1.8   < > = values in this interval not displayed.   Liver Function Tests No results for input(s): AST, ALT, ALKPHOS, BILITOT, PROT, ALBUMIN in the last 72 hours. No results for input(s): LIPASE, AMYLASE in the last 72 hours. Cardiac Enzymes No results for input(s): CKTOTAL, CKMB, CKMBINDEX, TROPONINI in the last 72 hours.  BNP: BNP (last 3 results) Recent Labs    11/26/19 2011  BNP 1,258.1*    ProBNP (last 3 results) No results for input(s): PROBNP in the last 8760 hours.   D-Dimer No results for input(s): DDIMER in the last 72 hours. Hemoglobin A1C No results for input(s):  HGBA1C in the last 72 hours. Fasting Lipid Panel No results for input(s): CHOL, HDL, LDLCALC, TRIG, CHOLHDL, LDLDIRECT in the last 72 hours. Thyroid Function Tests No results for input(s): TSH, T4TOTAL, T3FREE, THYROIDAB in the last 72 hours.  Invalid input(s): FREET3  Other results:   Imaging    DG Chest Port 1 View  Result Date: 12/02/2019 CLINICAL DATA:  Status post heart catheterization. EXAM: PORTABLE CHEST 1 VIEW COMPARISON:  12/01/2019 FINDINGS: There is a left chest wall pacer device with leads in the right atrial appendage and right ventricle. Pulmonary arterial catheter is identified with tip in the right lower lobe pulmonary artery. Intra-aortic balloon pump is noted below the level of the aortic knob at the T7 level. Stable cardiac enlargement. No pleural effusions or edema. No airspace consolidation. IMPRESSION: 1. Satisfactory position of intra-aortic balloon pump. 2.  Cardiac enlargement. Electronically Signed   By: Kerby Moors M.D.   On: 12/02/2019 09:35   DG Chest Port 1 View  Result Date: 12/01/2019 CLINICAL DATA:  Follow-up intra-aortic balloon pump EXAM: PORTABLE CHEST 1 VIEW COMPARISON:  11/26/2019 FINDINGS: Cardiac shadow is enlarged. Pacing device is again seen. New intra-aortic balloon pump is noted just below the aortic knob. The lungs are clear. Right-sided PICC line is seen in satisfactory position. Swan-Ganz catheter is noted extending from the femoral vein into the right pulmonary artery. IMPRESSION: Tubes and lines as described above.  No acute abnormality noted. Electronically Signed   By: Inez Catalina M.D.   On: 12/01/2019 10:36     Medications:     Scheduled Medications:  aspirin EC  81 mg Oral Daily   Chlorhexidine Gluconate Cloth  6 each Topical Daily   digoxin  0.125 mg Oral Daily   docusate  100 mg Oral BID   feeding supplement (PROSource TF)  45 mL Per Tube BID   feeding supplement (VITAL HIGH PROTEIN)  1,000 mL Per Tube Q24H   fentaNYL (SUBLIMAZE) injection  50 mcg Intravenous Once   gabapentin  600 mg Oral BID   hydrALAZINE  25 mg Oral Q8H   HYDROcodone-acetaminophen  1 tablet Oral QID   insulin aspart  0-15 Units Subcutaneous TID WC   polyethylene glycol  17 g Oral Daily   potassium chloride  40 mEq Oral BID   pravastatin  10 mg Oral q1800   sodium chloride flush  10-40 mL Intracatheter Q12H   sodium chloride flush  3 mL Intravenous Q12H   spironolactone  12.5 mg Oral Daily   venlafaxine XR  150 mg Oral Daily    Infusions:  sodium chloride 10 mL/hr (12/02/19 5638)   fentaNYL infusion INTRAVENOUS     furosemide (LASIX) infusion 15 mg/hr (12/02/19 0800)   heparin 750 Units/hr (12/02/19 0800)   lidocaine 1 mg/min (12/02/19 0915)   magnesium sulfate bolus IVPB 2 g (12/02/19 0915)   norepinephrine (LEVOPHED) Adult infusion Stopped (12/01/19 1030)    PRN Medications: Place/Maintain arterial  line **AND** sodium chloride, acetaminophen, fentaNYL, ipratropium-albuterol, midazolam, midazolam, sodium chloride flush    Assessment/Plan   1. Acute systolic CHF: Nonischemic cardiomyopathy.  Echo this admission with EF newly decreased to 20-25% with septal-lateral dyssynchrony and septal severe hypokinesis, normal RV, moderate TR, moderate MR. Echo in 5/20 prior to PPM placement showed EF 60-65%.  Possible causes of cardiomyopathy include chronic RV pacing, frequent NSVT/PVCs, ?CAD (has RFs), ?viral myocarditis (relatively recent COVID-19 infection).  No ETOH/drugs.  She has had increased dyspnea/palpitations x several weeks. Volume overloaded and CXR  suggested pulmonary edema at time of admit. Developed CGS overnight 7/20 w/ hypotension and narrow pulse pressure, w/ subsequent AKI, SCr 1.18>>1.85>>2.26. Lactic acid increased from 2.1>>5.0. Diuresis subsequently limited. She was initially on dopamine, then norepinephrine, milrinone, and back to norepinephrine.  Least ventricular ectopy on norepinephrine.  LHC/RHC 7/23 with no significant CAD, elevated filling pressures, and low cardiac output on norepinephrine. IABP placed.Currently off NE and milrinone.  - IABP at 1:1.  - Volume status remains elevated. Continue Lasix increase 15 mg/hr. Follow K and MG closely  - Continue digoxin 0.125 daily.  - Continue spironolactone.   - Will need cardiac MRI (device is compatible) when more stable and IABP out => look for infiltrative disease like sarcoid (we do not know why she had CHB) as well as evidence for prior myocarditis. Giant cell also a concern but timeline not consistent with this   - She has left bundle pacing as an alternative to CRT => QRS in 130s.  She will need ICD upgrade, so will review with EP whether she should be upgraded as well to traditional CRT.  - Will continue diuresis with IABP over weekend - Cardiomyopathy may be related to frequent VT/PVCs.  2. Wide complex tachycardia: VT with  multiple morphologies.  Increased ventricular arrhythmias over 6 months.  ?Underlying cause.  No significant coronary disease.  Consider infiltrative disease like sarcoidosis.  Increased VT runs with milrinone and dopamine, off both, did better with norepinephrine.  Now with IABP.   Torsades/VT x 3 today with prolonged QT required DC-CV - Switch amio to lido - Atrial pace at 100 - Keep K > 4.0 Mg > 2.0 - EP following. - Check ESR - Start empiric steroids for possible cardiac sarcoid - Will eventually need ICD upgrade.  3. H/o CHB: Cause is uncertain.  Cath in 5/20 prior to PPM showed no significant disease.  - Eventual cardiac MRI to look for infiltrative disease such as sarcoidosis.  4. AKI: In setting of hypotension.  Creatinine trending down with hemodynamic support.   Length of Stay: 6  CRITICAL CARE Performed by: Glori Bickers  Total critical care time: 55 minutes  Critical care time was exclusive of separately billable procedures and treating other patients.  Critical care was necessary to treat or prevent imminent or life-threatening deterioration.  Critical care was time spent personally by me (independent of midlevel providers or residents) on the following activities: development of treatment plan with patient and/or surrogate as well as nursing, discussions with consultants, evaluation of patient's response to treatment, examination of patient, obtaining history from patient or surrogate, ordering and performing treatments and interventions, ordering and review of laboratory studies, ordering and review of radiographic studies, pulse oximetry and re-evaluation of patient's condition.      12/02/2019 10:00 AM

## 2019-12-02 NOTE — Progress Notes (Signed)
NAME:  Lisa Poole, MRN:  [id number], DOB:  1965-12-01, LOS: 6 ADMISSION DATE:  11/26/2019, CONSULTATION DATE:  11/28/2019 REFERRING MD:  Charissa Bash, MD  CHIEF COMPLAINT: Cardiogenic shock  Brief History   Patient transferred to ICU for cardiogenic shock.  History of present illness   Lisa Poole presented to emergency department on 7/18 for evaluation of shortness of breath, lower extremity swelling, and not feeling right.  Patient had signs of worsening heart failure including orthopnea, weight gain, elevated BNP, and pulmonary edema on chest x-ray.  She had episodes of V. tach 7/19 and cardiology was consulted and TTE ordered.    Of note , patient has a pmhx of complete heart block s/p medtronic dual chamber pacemaker 02/2019. No specific etiology for causing complete heart block was identified. She also has history of Covid-19 infection 08/2019, not requiring hospitilization.   TTE showed an EF 20 to 25% compared to echo in 2020 which showed EF 60 - 65%.  The echo also showed regional wall motion abnormalities (anterior, septal and lateral).  Moderate to severe TR with elevated RVSP,  and moderate MR. She was being dirueesed and started on amiodarone for NSVT.Overnight patient developed cardiogenic shock with hypotension and narrow pulse pressure with lactic acidosis and AKI. Dopamine was started and BP improved. With recurrent episodes of NSVT patient was transferred to ICU.     Denies any cough, fever, difficulty urinating before admission, rashes, joint pain, or recent changes in bowl movements.  Past Medical History  Past Medical History: No date: Aortic insufficiency 09/13/2006: Asthma 02/11/2019: Complete heart block ([place]) 12/12/2018: Cystocele without uterine prolapse No date: Diabetes mellitus without complication (HCC) No date: Elevated troponin level No date: Hypercholesteremia No date: Hypertension 12/10/2017: Primary osteoarthritis of left knee  Significant Hospital Events     7/18 admit 7/19 cardiogenic shock 7/20 transferred to from ED to ICU Consults:  EP cardiology HF cardiology Procedures:  7/23: RHC/LHC and coronary angiography 7/23: IABP   Significant Diagnostic Tests:  TTE: EF severely reduced , 20-25%, compared to 60-65% one year ago  Micro Data:  MRSA PCR: Negative SARS coronavirus 2: Negative Antimicrobials:  N/A  Interim history/subjective:  Cardiac catheterization yesterday showed nonobstructive coronary disease.  Low cardiac index at 1.5.  Elevated PCWP at 27.  Intra-aortic balloon pump placed for cardiac support milrinone discontinued due to ventricular arrhythmias.  Was tolerating well until this morning when she had sudden onset of recurrent polymorphic VT consistent with torsade de pointes with prolonged QTC.  Emergently intubated in case of recurrent arrhythmias and also to decrease sympathetic drive which might be causing increased ventricular irritability.  Started on lidocaine and potassium corrected.  Objective   Blood pressure (!) 142/80, pulse 81, temperature (!) 97.3 F (36.3 C), resp. rate 14, height 5' (1.524 m), weight 84.7 kg, last menstrual period 07/25/2015, SpO2 100 %. PAP: (33-88)/(9-60) 48/31 CVP:  [5 mmHg-20 mmHg] 17 mmHg  Vent Mode: PRVC FiO2 (%):  [60 %-100 %] 60 % Set Rate:  [16 bmp] 16 bmp Vt Set:  [370 mL] 370 mL PEEP:  [5 cmH20] 5 cmH20   Intake/Output Summary (Last 24 hours) at 12/02/2019 1456 Last data filed at 12/02/2019 1400 Gross per 24 hour  Intake 2393.56 ml  Output 8625 ml  Net -6231.44 ml   Filed Weights   11/29/19 0500 12/01/19 0346 12/02/19 0600  Weight: 88.5 kg 88.7 kg 84.7 kg   PAP: (33-88)/(9-60) 48/31 CVP:  [5 mmHg-20 mmHg] 17 mmHg  CI:  1.6 SVR 1759  Examination: General: NAD, mild obesity. HEENT: ET tube OG tube in place. Cardiovascular:  Heart sounds muffled by intra-aortic balloon pump. AV paced.  Peripheral pulses intact.  JVP not visible. Pulmonary : CTAB. Normal  respiratory effort.   Abdominal: soft, bowel sounds present Musculoskeletal: No edema. No deformity, injury  Psychiatric/Behavioral: [facility] Problem list     Assessment & Plan:   Critically ill due to cardiogenic shock secondary to nonischemic cardiomyopathy requiring IABP support. -Continue one-to-one augmentation. -Continue hydralazine for afterload reduction (SVRI 3183)  Nonischemic cardiomyopathy.  Etiology unclear. Given pre-existing heart block, possible cardiac sarcoidosis. -We will initiate empiric steroids at 40 mg twice daily -Continue spironolactone and digoxin.  Critically ill due to acute hypoxic respiratory failure requiring mechanical ventilation -Full ventilatory support to continue until hemodynamics improve and dysrhythmias stopped.  Critically ill due to recurrent torsade de pointes secondary to amiodarone requiring initiation of lidocaine, increased pacing rate and repletion of magnesium. -Continue IV lidocaine for today -Replete magnesium -Discontinue amiodarone.  QTC warning in drug allergies.  Improving AKI.  Likely cardiorenal syndrome -Continue IV diuretics.  Severe hypokalemia. -Replete both potassium and magnesium.  Type 2 diabetes   Daily Goals Checklist  Pain/Anxiety/Delirium protocol (if indicated): Fentanyl infusion, Versed as needed VAP protocol (if indicated): Bundle in place. Respiratory support goals: Full ventilatory support. Blood pressure target: Currently on no vasoactive support.  Continue IABP DVT prophylaxis: On IV heparin. Nutrition Status: Initiate enteral nutrition. GI prophylaxis: Start PPI Fluid status goals: Continue furosemide infusion targeting -0.5 to 1 L Urinary catheter: Guide hemodynamic management Central lines: Left femoral art line, right femoral and Swan-Ganz catheter Glucose control: Type 2 diabetes with adequate blood sugar control on insulin correction scale.  Will likely increase on  steroids. Mobility/therapy needs: Bedrest Antibiotic de-escalation: No antibiotics Home medication reconciliation: Reconciled. Daily labs: CBC, BMP Code Status: Full code Family Communication: Husband updated Disposition: ICU   Labs   CBC: Recent Labs  Lab 11/29/19 0420 11/29/19 0420 11/30/19 0350 11/30/19 0350 12/01/19 0328 12/01/19 0328 12/01/19 0844 12/01/19 0845 12/02/19 0339 12/02/19 0926 12/02/19 1106  WBC 8.0  --  6.7  --  9.7  --   --   --  6.8 8.7  --   NEUTROABS 5.1  --  3.2  --  5.4  --   --   --  3.6  --   --   HGB 12.0   < > 12.5   < > 14.2   < > 16.3* 16.7* 13.3 13.9 14.3  HCT 39.1   < > 40.9   < > 45.8   < > 48.0* 49.0* 43.1 45.4 42.0  MCV 87.7  --  88.3  --  88.2  --   --   --  86.9 87.0  --   PLT 186  --  204  --  272  --   --   --  215 250  --    < > = values in this interval not displayed.    Basic Metabolic Panel: Recent Labs  Lab 11/26/19 2012 11/27/19 0402 11/27/19 1927 11/28/19 0215 11/28/19 0535 11/28/19 1500 11/29/19 0750 11/29/19 1500 11/30/19 0350 11/30/19 0350 12/01/19 0328 12/01/19 0844 12/01/19 0845 12/01/19 1809 12/02/19 0339 12/02/19 0926 12/02/19 1106  NA  --    < > 139   < > 138   < >  --    < > 135   < > 137   < >  142 133* 139 137 137  K  --    < > 4.0   < > 4.6   < >  --    < > 3.8   < > 4.3   < > 3.5 3.9 3.7 3.1* 2.4*  CL  --    < > 98   < > 95*   < >  --    < > 93*  --  97*  --   --  91* 93* 92*  --   CO2  --    < > 27   < > 26   < >  --    < > 30  --  28  --   --  31 34* 33*  --   GLUCOSE  --    < > 134*   < > 139*   < >  --    < > 263*  --  168*  --   --  241* 109* 266*  --   BUN  --    < > 24*   < > 28*   < >  --    < > 32*  --  25*  --   --  22* 21* 20  --   CREATININE  --    < > 1.85*   < > 2.16*   < >  --    < > 1.59*  --  1.19*  --   --  1.33* 1.17* 1.04*  --   CALCIUM  --    < > 8.8*   < > 9.3   < >  --    < > 8.4*  --  8.8*  --   --  8.3* 8.8* 8.8*  --   MG 1.7  --  2.3  --  1.9   < > 1.9  --  1.9  --  1.8   --   --   --  1.8 1.7  --   PHOS 3.5  --  3.9  --  4.4  --   --   --   --   --   --   --   --   --   --  2.8  --    < > = values in this interval not displayed.   GFR: Estimated Creatinine Clearance: 59.1 mL/min (A) (by C-G formula based on SCr of 1.04 mg/dL (H)). Recent Labs  Lab 11/28/19 0535 11/28/19 0612 11/28/19 0813 11/28/19 1413 11/28/19 1713 11/29/19 0420 11/30/19 0350 12/01/19 0328 12/02/19 0339 12/02/19 0926  WBC  --    < >  --   --   --    < > 6.7 9.7 6.8 8.7  LATICACIDVEN 4.0*  --  2.5* 2.1* 2.2*  --   --   --   --   --    < > = values in this interval not displayed.    Liver Function Tests: Recent Labs  Lab 11/28/19 0215 11/28/19 0535  AST 55* 103*  ALT 26 50*  ALKPHOS 83 85  BILITOT 2.8* 3.4*  PROT 6.5 6.6  ALBUMIN 3.2* 3.3*   No results for input(s): LIPASE, AMYLASE in the last 168 hours. No results for input(s): AMMONIA in the last 168 hours.  ABG    Component Value Date/Time   PHART 7.520 (H) 12/02/2019 1106   PCO2ART 51.1 (H) 12/02/2019 1106   PO2ART 482 (H) 12/02/2019 1106   HCO3 41.9 (H) 12/02/2019 1106  TCO2 43 (H) 12/02/2019 1106   O2SAT 100.0 12/02/2019 1106     Coagulation Profile: No results for input(s): INR, PROTIME in the last 168 hours.  Cardiac Enzymes: No results for input(s): CKTOTAL, CKMB, CKMBINDEX, TROPONINI in the last 168 hours.  HbA1C: Hgb A1c MFr Bld  Date/Time Value Ref Range Status  11/28/2019 06:19 PM 7.2 (H) 4.8 - 5.6 % Final    Comment:    (NOTE) Pre diabetes:          5.7%-6.4%  Diabetes:              >6.4%  Glycemic control for   <7.0% adults with diabetes   11/27/2019 04:02 AM 7.1 (H) 4.8 - 5.6 % Final    Comment:    (NOTE) Pre diabetes:          5.7%-6.4%  Diabetes:              >6.4%  Glycemic control for   <7.0% adults with diabetes     CBG: Recent Labs  Lab 12/01/19 1108 12/01/19 1624 12/01/19 2154 12/02/19 0610 12/02/19 1149  GLUCAP 148* 167* 175* 143* 208*    CRITICAL  CARE Performed by: Kipp Brood   Total critical care time: 60 minutes  Critical care time was exclusive of separately billable procedures and treating other patients.  Critical care was necessary to treat or prevent imminent or life-threatening deterioration.  Critical care was time spent personally by me on the following activities: development of treatment plan with patient and/or surrogate as well as nursing, discussions with consultants, evaluation of patient's response to treatment, examination of patient, obtaining history from patient or surrogate, ordering and performing treatments and interventions, ordering and review of laboratory studies, ordering and review of radiographic studies, pulse oximetry, re-evaluation of patient's condition and participation in multidisciplinary rounds.  Kipp Brood, MD [facility] ICU Physician [place]  Pager: [phone] Mobile: [phone] After hours: [phone].  12/02/2019, 3:29 PM

## 2019-12-02 NOTE — Progress Notes (Signed)
Electrophysiology Rounding Note  Patient Name: Lisa Poole Date of Encounter: 12/02/2019  Primary Cardiologist: No primary care provider on file. Electrophysiologist: Dr. Lovena Le   Subjective   Recurrent VT PM with prolonged QT but some initiation sequences have PVC > 600 msec Shocked recurrently and now intubated  Inpatient Medications    Scheduled Meds: . aspirin EC  81 mg Oral Daily  . Chlorhexidine Gluconate Cloth  6 each Topical Daily  . digoxin  0.125 mg Oral Daily  . etomidate      . fentaNYL      . fentaNYL (SUBLIMAZE) injection  25 mcg Intravenous Once  . gabapentin  600 mg Oral BID  . hydrALAZINE  25 mg Oral Q8H  . HYDROcodone-acetaminophen  1 tablet Oral QID  . insulin aspart  0-15 Units Subcutaneous TID WC  . midazolam      . phenylephrine      . potassium chloride  40 mEq Oral BID  . pravastatin  10 mg Oral q1800  . rocuronium bromide      . sodium chloride flush  10-40 mL Intracatheter Q12H  . sodium chloride flush  3 mL Intravenous Q12H  . spironolactone  12.5 mg Oral Daily  . venlafaxine XR  150 mg Oral Daily   Continuous Infusions: . sodium chloride 10 mL/hr (12/02/19 1443)  . amiodarone 30 mg/hr (12/02/19 0800)  . furosemide (LASIX) infusion 15 mg/hr (12/02/19 0800)  . heparin 750 Units/hr (12/02/19 0800)  . lidocaine    . magnesium sulfate bolus IVPB    . norepinephrine (LEVOPHED) Adult infusion Stopped (12/01/19 1030)   PRN Meds: Place/Maintain arterial line **AND** sodium chloride, acetaminophen, ipratropium-albuterol, sodium chloride flush   Vital Signs    Vitals:   12/02/19 0600 12/02/19 0700 12/02/19 0754 12/02/19 0800  BP: (!) 118/59 94/77  (!) 129/77  Pulse: 76 92  78  Resp: 12 18  15   Temp:   (!) 96.6 F (35.9 C)   TempSrc:      SpO2: 99% 95%  94%  Weight: 84.7 kg     Height:        Intake/Output Summary (Last 24 hours) at 12/02/2019 0934 Last data filed at 12/02/2019 0800 Gross per 24 hour  Intake 2676.27 ml  Output  9235 ml  Net -6558.73 ml   Filed Weights   11/29/19 0500 12/01/19 0346 12/02/19 0600  Weight: 88.5 kg 88.7 kg 84.7 kg    Physical Exam    Well developed and nourished intubated HENT normal Neck supple with JVP-  Clear Regular rate and rhythm, no murmurs or gallops Abd-soft with active BS No Clubbing cyanosis edema Skin-warm and dry    Grossly normal sensory and motor function     Labs    CBC Recent Labs    12/01/19 0328 12/01/19 0844 12/01/19 0845 12/02/19 0339  WBC 9.7  --   --  6.8  NEUTROABS 5.4  --   --  3.6  HGB 14.2   < > 16.7* 13.3  HCT 45.8   < > 49.0* 43.1  MCV 88.2  --   --  86.9  PLT 272  --   --  215   < > = values in this interval not displayed.   Basic Metabolic Panel Recent Labs    12/01/19 0328 12/01/19 0844 12/01/19 1809 12/02/19 0339  NA 137   < > 133* 139  K 4.3   < > 3.9 3.7  CL 97*  --  91*  93*  CO2 28  --  31 34*  GLUCOSE 168*  --  241* 109*  BUN 25*  --  22* 21*  CREATININE 1.19*  --  1.33* 1.17*  CALCIUM 8.8*  --  8.3* 8.8*  MG 1.8  --   --  1.8   < > = values in this interval not displayed.   Liver Function Tests No results for input(s): AST, ALT, ALKPHOS, BILITOT, PROT, ALBUMIN in the last 72 hours. No results for input(s): LIPASE, AMYLASE in the last 72 hours. Cardiac Enzymes No results for input(s): CKTOTAL, CKMB, CKMBINDEX, TROPONINI in the last 72 hours.   Telemetry    NSR with continued runs of NSVT and PVCs; overall improved NE. Increased burden overnight with SOB, but again improved this am. (personally reviewed)  Radiology    CARDIAC CATHETERIZATION  Result Date: 12/01/2019 1. Elevated right and left heart filling pressures. 2. Low cardiac output on norepinephrine 3. 3. Mixed pulmonary venous/pulmonary arterial hypertension. 4. No significant coronary disease. 5. Successful IABP placement.   DG Chest Port 1 View  Result Date: 12/01/2019 CLINICAL DATA:  Follow-up intra-aortic balloon pump EXAM: PORTABLE CHEST  1 VIEW COMPARISON:  11/26/2019 FINDINGS: Cardiac shadow is enlarged. Pacing device is again seen. New intra-aortic balloon pump is noted just below the aortic knob. The lungs are clear. Right-sided PICC line is seen in satisfactory position. Swan-Ganz catheter is noted extending from the femoral vein into the right pulmonary artery. IMPRESSION: Tubes and lines as described above.  No acute abnormality noted. Electronically Signed   By: Inez Catalina M.D.   On: 12/01/2019 10:36   Chest Xray personally reviewed  The midline catheter is NOT in RA  SG this am in the lung Patient Profile        Lisa Varas Johnsonis a 54 y.o.femalewith a history of CHB s/p Medtronic dual chamber PPM 02/2019, SCAF, aortic insuffiency, HTN, HLD, T2DM, and COVID infection 4/2021who is being seen today for the evaluation of Wide complex tachycardiaat the request of Dr. Aundra Dubin.  Assessment & Plan    1.VT, recurrent, multiple morphologies - sustained  ? VT PM with some long short coupling ? TdP    2. CHB s/p MDT DDD pacer    3. Acute systolic CHF now IABP    4. Hypokalemia/Hypomagnesemia repleted    5. AKI   Concerning mechanism of arrhyhtmia for TdP with QTc nterval > 500 mc and some long-short coupling but the couipling interval is extremely long which raises the concern as to themechanism  We have been discussing infiltrative disease and giant cell, yday discussed steroids, but held off, today all agree for empiric steroids  For now will stop amio-- not on inotropes with good balloon augmentation  Will increase HR and use lido to shorten QT interval  Intubated per CCM for shocks  55 min CCT   Signed, Virl Axe, MD  12/02/2019

## 2019-12-02 NOTE — Procedures (Signed)
Arterial Catheter Insertion Procedure Note  Lisa Poole  [id number]  03-15-1966  Date:12/02/19  Time:3:32 PM    Provider Performing: Kipp Brood    Procedure: Insertion of Arterial Line [phone]) without US guidance  Indication(s) Blood pressure monitoring and/or need for frequent ABGs  Consent Unable to obtain consent due to emergent nature of procedure.  Anesthesia 1% lidocaine infiltration   Time Out Verified patient identification, verified procedure, site/side was marked, verified correct patient position, special equipment/implants available, medications/allergies/relevant history reviewed, required imaging and test results available.   Sterile Technique Maximal sterile technique including full sterile barrier drape, hand hygiene, sterile gown, sterile gloves, mask, hair covering, sterile ultrasound probe cover (if used).   Procedure Description Area of catheter insertion was cleaned with chlorhexidine and draped in sterile fashion. With real-time ultrasound guidance an arterial catheter was placed into the left femoral artery.  Appropriate arterial tracings confirmed on monitor.     Complications/Tolerance None; patient tolerated the procedure well.   EBL Minimal  Kipp Brood, MD [facility] ICU Physician [place]  Pager: [phone] Mobile: [phone] After hours: [phone].  12/02/2019, 3:33 PM

## 2019-12-02 NOTE — Procedures (Signed)
Intubation Procedure Note  JAZEL NIMMONS  [id number]  [birth year]  Date:12/02/19  Time:3:29 PM   Provider Performing:Kiona Blume    Procedure: Intubation (31500)  Indication(s) Respiratory Failure  Consent Risks of the procedure as well as the alternatives and risks of each were explained to the patient and/or caregiver.  Consent for the procedure was obtained and is signed in the bedside chart   Anesthesia Etomidate, Versed, Fentanyl and Rocuronium   Time Out Verified patient identification, verified procedure, site/side was marked, verified correct patient position, special equipment/implants available, medications/allergies/relevant history reviewed, required imaging and test results available.   Sterile Technique Usual hand hygeine, masks, and gloves were used   Procedure Description Patient positioned in bed supine.  Sedation given as noted above.  Patient was intubated with endotracheal tube using MAC 3 direct laryngoscopy.  View was Grade 1 full glottis .  Number of attempts was 1.  Colorimetric CO2 detector was consistent with tracheal placement.   Complications/Tolerance None; patient tolerated the procedure well. Chest X-ray is ordered to verify placement.  Kipp Brood, MD [facility] ICU Physician [place]  Pager: [phone] Mobile: [phone] After hours: [phone].  12/02/2019, 3:30 PM

## 2019-12-02 NOTE — Progress Notes (Signed)
   12/02/19 1600  Clinical Encounter Type  Visited With Patient and family together  Visit Type Follow-up  Referral From Nurse  Consult/Referral To Chaplain  Spiritual Encounters  Spiritual Needs Prayer;Emotional  Stress Factors  Patient Stress Factors Health changes  Family Stress Factors Major life changes   Chaplain responded to page for family support. Chaplain was able to talk with Anabel, daughter and partner. Chaplain provided ministry of presence, active listening, and prayer. Chaplains remain available for support as needs arise.   Chaplain Resident, Evelene Croon, M Div [phone] on-call pager

## 2019-12-02 NOTE — Progress Notes (Signed)
ANTICOAGULATION CONSULT NOTE - Follow Up Consult  Pharmacy Consult for heparin Indication: IABP  Labs: Recent Labs    11/30/19 0350 11/30/19 0350 12/01/19 0328 12/01/19 0328 12/01/19 0844 12/01/19 0844 12/01/19 0845 12/01/19 1809 12/02/19 0339  HGB 12.5   < > 14.2   < > 16.3*   < > 16.7*  --  13.3  HCT 40.9   < > 45.8   < > 48.0*  --  49.0*  --  43.1  PLT 204  --  272  --   --   --   --   --  215  HEPARINUNFRC  --   --   --   --   --   --   --  0.51 0.55  CREATININE 1.59*  --  1.19*  --   --   --   --  1.33*  --    < > = values in this interval not displayed.    Assessment: 54yo female remains supratherapeutic on heparin with higher level despite decreased rate; no gtt issues or signs of bleeding per RN.  Goal of Therapy:  Heparin level 0.2-0.5 units/ml   Plan:  Will decrease heparin gtt by 1 units/kg/hr to 750 units/hr and check level in 8 hours.    Wynona Neat, PharmD, BCPS  12/02/2019,4:55 AM

## 2019-12-03 ENCOUNTER — Inpatient Hospital Stay (HOSPITAL_COMMUNITY): Payer: Medicare Other

## 2019-12-03 LAB — COOXEMETRY PANEL
Carboxyhemoglobin: 1.8 % — ABNORMAL HIGH (ref 0.5–1.5)
Methemoglobin: 1 % (ref 0.0–1.5)
O2 Saturation: 77.1 %
Total hemoglobin: 14 g/dL (ref 12.0–16.0)

## 2019-12-03 LAB — CBC WITH DIFFERENTIAL/PLATELET
Abs Immature Granulocytes: 0.03 10*3/uL (ref 0.00–0.07)
Basophils Absolute: 0 10*3/uL (ref 0.0–0.1)
Basophils Relative: 0 %
Eosinophils Absolute: 0 10*3/uL (ref 0.0–0.5)
Eosinophils Relative: 0 %
HCT: 45.2 % (ref 36.0–46.0)
Hemoglobin: 14.2 g/dL (ref 12.0–15.0)
Immature Granulocytes: 0 %
Lymphocytes Relative: 4 %
Lymphs Abs: 0.5 10*3/uL — ABNORMAL LOW (ref 0.7–4.0)
MCH: 27.2 pg (ref 26.0–34.0)
MCHC: 31.4 g/dL (ref 30.0–36.0)
MCV: 86.4 fL (ref 80.0–100.0)
Monocytes Absolute: 0.5 10*3/uL (ref 0.1–1.0)
Monocytes Relative: 5 %
Neutro Abs: 10 10*3/uL — ABNORMAL HIGH (ref 1.7–7.7)
Neutrophils Relative %: 91 %
Platelets: 242 10*3/uL (ref 150–400)
RBC: 5.23 MIL/uL — ABNORMAL HIGH (ref 3.87–5.11)
RDW: 14.7 % (ref 11.5–15.5)
WBC: 11.1 10*3/uL — ABNORMAL HIGH (ref 4.0–10.5)
nRBC: 0 % (ref 0.0–0.2)

## 2019-12-03 LAB — BASIC METABOLIC PANEL
Anion gap: 10 (ref 5–15)
BUN: 20 mg/dL (ref 6–20)
CO2: 34 mmol/L — ABNORMAL HIGH (ref 22–32)
Calcium: 9 mg/dL (ref 8.9–10.3)
Chloride: 94 mmol/L — ABNORMAL LOW (ref 98–111)
Creatinine, Ser: 1.02 mg/dL — ABNORMAL HIGH (ref 0.44–1.00)
GFR calc Af Amer: >60 mL/min (ref >60)
GFR calc non Af Amer: >60 mL/min (ref >60)
Glucose, Bld: 227 mg/dL — ABNORMAL HIGH (ref 70–99)
Potassium: 4.4 mmol/L (ref 3.5–5.1)
Sodium: 138 mmol/L (ref 135–145)

## 2019-12-03 LAB — TYPE AND SCREEN
ABO/RH(D): AB POS
Antibody Screen: NEGATIVE

## 2019-12-03 LAB — CBC
HCT: 44.5 % (ref 36.0–46.0)
HCT: 43.8 % (ref 36.0–46.0)
Hemoglobin: 14.1 g/dL (ref 12.0–15.0)
Hemoglobin: 14 g/dL (ref 12.0–15.0)
MCH: 26.7 pg (ref 26.0–34.0)
MCH: 26.8 pg (ref 26.0–34.0)
MCHC: 31.7 g/dL (ref 30.0–36.0)
MCHC: 32 g/dL (ref 30.0–36.0)
MCV: 84.3 fL (ref 80.0–100.0)
MCV: 83.9 fL (ref 80.0–100.0)
Platelets: 259 10*3/uL (ref 150–400)
Platelets: 279 10*3/uL (ref 150–400)
RBC: 5.28 MIL/uL — ABNORMAL HIGH (ref 3.87–5.11)
RBC: 5.22 MIL/uL — ABNORMAL HIGH (ref 3.87–5.11)
RDW: 14.7 % (ref 11.5–15.5)
RDW: 14.7 % (ref 11.5–15.5)
WBC: 14.9 10*3/uL — ABNORMAL HIGH (ref 4.0–10.5)
WBC: 16.1 10*3/uL — ABNORMAL HIGH (ref 4.0–10.5)
nRBC: 0 % (ref 0.0–0.2)
nRBC: 0 % (ref 0.0–0.2)

## 2019-12-03 LAB — GLUCOSE, CAPILLARY
Glucose-Capillary: 215 mg/dL — ABNORMAL HIGH (ref 70–99)
Glucose-Capillary: 110 mg/dL — ABNORMAL HIGH (ref 70–99)
Glucose-Capillary: 102 mg/dL — ABNORMAL HIGH (ref 70–99)
Glucose-Capillary: 312 mg/dL — ABNORMAL HIGH (ref 70–99)

## 2019-12-03 LAB — PHOSPHORUS: Phosphorus: 2.5 mg/dL (ref 2.5–4.6)

## 2019-12-03 LAB — LIDOCAINE LEVEL: Lidocaine Lvl: 3.4 ug/mL (ref 1.5–5.0)

## 2019-12-03 LAB — MAGNESIUM: Magnesium: 2.4 mg/dL (ref 1.7–2.4)

## 2019-12-03 LAB — HEPARIN LEVEL (UNFRACTIONATED)
Heparin Unfractionated: 0.31 IU/mL (ref 0.30–0.70)
Heparin Unfractionated: 0.29 IU/mL — ABNORMAL LOW (ref 0.30–0.70)

## 2019-12-03 MED ORDER — HYDRALAZINE HCL 10 MG PO TABS: 5.0000 mg | ORAL_TABLET | Freq: Three times a day (TID) | ORAL | Status: DC

## 2019-12-03 MED ORDER — LOSARTAN POTASSIUM 25 MG PO TABS
12.5000 mg | ORAL_TABLET | Freq: Two times a day (BID) | ORAL | Status: DC
  Administered 2019-12-03 – 2019-12-04 (×3): 12.5 mg via ORAL
  Filled 2019-12-03 (×3): qty 1

## 2019-12-03 MED ORDER — SPIRONOLACTONE 25 MG PO TABS
25.0000 mg | ORAL_TABLET | Freq: Every day | ORAL | Status: DC
  Administered 2019-12-04 – 2019-12-08 (×5): 25 mg via ORAL
  Filled 2019-12-03 (×5): qty 1

## 2019-12-03 MED ORDER — PANTOPRAZOLE SODIUM 40 MG PO TBEC
40.0000 mg | DELAYED_RELEASE_TABLET | Freq: Every day | ORAL | Status: DC
  Administered 2019-12-04 – 2019-12-08 (×5): 40 mg via ORAL
  Filled 2019-12-03 (×5): qty 1

## 2019-12-03 MED ORDER — MORPHINE SULFATE (PF) 2 MG/ML IV SOLN
2.0000 mg | INTRAVENOUS | Status: AC | PRN
  Administered 2019-12-03 – 2019-12-04 (×4): 2 mg via INTRAVENOUS
  Filled 2019-12-03 (×4): qty 1

## 2019-12-03 MED ORDER — DOCUSATE SODIUM 100 MG PO CAPS
100.0000 mg | ORAL_CAPSULE | Freq: Two times a day (BID) | ORAL | Status: DC
  Administered 2019-12-03 – 2019-12-07 (×5): 100 mg via ORAL
  Filled 2019-12-03 (×7): qty 1

## 2019-12-03 MED ORDER — DIGOXIN 125 MCG PO TABS
0.1250 mg | ORAL_TABLET | Freq: Every day | ORAL | Status: DC
  Administered 2019-12-04 – 2019-12-08 (×5): 0.125 mg via ORAL
  Filled 2019-12-03 (×5): qty 1

## 2019-12-03 MED ORDER — FUROSEMIDE 10 MG/ML IJ SOLN
80.0000 mg | Freq: Two times a day (BID) | INTRAMUSCULAR | Status: DC
  Administered 2019-12-03: 80 mg via INTRAVENOUS
  Filled 2019-12-03: qty 8

## 2019-12-03 MED ORDER — ASPIRIN EC 81 MG PO TBEC
81.0000 mg | DELAYED_RELEASE_TABLET | Freq: Every day | ORAL | Status: DC
  Administered 2019-12-04 – 2019-12-08 (×5): 81 mg via ORAL
  Filled 2019-12-03 (×5): qty 1

## 2019-12-03 MED ORDER — INSULIN DETEMIR 100 UNIT/ML ~~LOC~~ SOLN
7.0000 [IU] | Freq: Two times a day (BID) | SUBCUTANEOUS | Status: DC
  Administered 2019-12-03 – 2019-12-08 (×11): 7 [IU] via SUBCUTANEOUS
  Filled 2019-12-03 (×13): qty 0.07

## 2019-12-03 MED ORDER — POTASSIUM CHLORIDE CRYS ER 20 MEQ PO TBCR
40.0000 meq | EXTENDED_RELEASE_TABLET | Freq: Two times a day (BID) | ORAL | Status: AC
  Administered 2019-12-03: 40 meq via ORAL
  Filled 2019-12-03: qty 2

## 2019-12-03 MED ORDER — GABAPENTIN 600 MG PO TABS
600.0000 mg | ORAL_TABLET | Freq: Two times a day (BID) | ORAL | Status: DC
  Administered 2019-12-03 – 2019-12-08 (×10): 600 mg via ORAL
  Filled 2019-12-03 (×10): qty 1

## 2019-12-03 MED ORDER — ISOSORBIDE MONONITRATE ER 30 MG PO TB24: 15.0000 mg | ORAL_TABLET | Freq: Every day | ORAL | Status: DC

## 2019-12-03 MED ORDER — PRAVASTATIN SODIUM 10 MG PO TABS
10.0000 mg | ORAL_TABLET | Freq: Every day | ORAL | Status: DC
  Administered 2019-12-03 – 2019-12-07 (×5): 10 mg via ORAL
  Filled 2019-12-03 (×5): qty 1

## 2019-12-03 MED ORDER — SPIRONOLACTONE 12.5 MG HALF TABLET: 12.5000 mg | ORAL_TABLET | Freq: Every day | ORAL | Status: DC

## 2019-12-03 NOTE — Progress Notes (Signed)
Electrophysiology Rounding Note  Patient Name: Lisa Poole Date of Encounter: 12/03/2019  Primary Cardiologist: No primary care provider on file. Electrophysiologist: Dr. Lovena Le   Subjective   Awake and alert No pain but still intubated   Inpatient Medications    Scheduled Meds: . aspirin  81 mg Per Tube Daily  . chlorhexidine gluconate (MEDLINE KIT)  15 mL Mouth Rinse BID  . Chlorhexidine Gluconate Cloth  6 each Topical Daily  . digoxin  0.125 mg Per Tube Daily  . docusate  100 mg Per Tube BID  . feeding supplement (VITAL HIGH PROTEIN)  1,000 mL Per Tube Q24H  . fentaNYL (SUBLIMAZE) injection  50 mcg Intravenous Once  . gabapentin  600 mg Per Tube BID  . hydrALAZINE  5 mg Per Tube Q8H  . insulin aspart  0-15 Units Subcutaneous Q4H  . insulin detemir  7 Units Subcutaneous BID  . isosorbide mononitrate  15 mg Oral Daily  . mouth rinse  15 mL Mouth Rinse 10 times per day  . methylPREDNISolone (SOLU-MEDROL) injection  40 mg Intravenous Q12H  . pantoprazole sodium  40 mg Per Tube Daily  . polyethylene glycol  17 g Oral Daily  . potassium chloride  40 mEq Per Tube BID  . pravastatin  10 mg Per Tube q1800  . sodium chloride flush  10-40 mL Intracatheter Q12H  . sodium chloride flush  3 mL Intravenous Q12H  . spironolactone  12.5 mg Per Tube Daily   Continuous Infusions: . sodium chloride 10 mL/hr at 12/02/19 1414  . fentaNYL infusion INTRAVENOUS 50 mcg/hr (12/03/19 1100)  . furosemide (LASIX) infusion 15 mg/hr (12/03/19 1100)  . heparin 750 Units/hr (12/03/19 1100)  . lidocaine 1 mg/min (12/03/19 1100)   PRN Meds: Place/Maintain arterial line **AND** sodium chloride, acetaminophen, fentaNYL, ipratropium-albuterol, midazolam, midazolam, sodium chloride flush   Vital Signs    Vitals:   12/03/19 0900 12/03/19 0930 12/03/19 1000 12/03/19 1100  BP:      Pulse: 100 100 100 100  Resp: _0 Temp: 98.4 F (36.9 C) 98.1 F (36.7 C) 98.4 F (36.9 C) 98.8 F  (37.1 C)  TempSrc:      SpO2: 100% 100% 100% 100%  Weight:      Height:        Intake/Output Summary (Last 24 hours) at 12/03/2019 1115 Last data filed at 12/03/2019 1100 Gross per 24 hour  Intake 2890.29 ml  Output 6870 ml  Net -3979.71 ml   Filed Weights   12/01/19 0346 12/02/19 0600 12/03/19 0600  Weight: 88.7 kg 84.7 kg 81.7 kg    Physical Exam    Well developed and nourished intubated HENT normal Neck supple   Clear Regular rate and rhythm, no murmurs or gallops Abd-soft with active BS No Clubbing cyanosis edema Skin-warm and dry A & Oriented  Grossly normal sensory and motor function  ECG QTc aobut 450    Labs    CBC Recent Labs    12/02/19 0339 12/02/19 0339 12/02/19 0926 12/02/19 0926 12/02/19 1106 12/03/19 0340  WBC 6.8   < > 8.7  --   --  11.1*  NEUTROABS 3.6  --   --   --   --  10.0*  HGB 13.3   < > 13.9   < > 14.3 14.2  HCT 43.1   < > 45.4   < > 42.0 45.2  MCV 86.9   < > 87.0  --   --  86.4  PLT 215   < > 250  --   --  242   < > = values in this interval not displayed.   Basic Metabolic Panel Recent Labs    12/02/19 1709 12/03/19 0340  NA 136 138  K 4.9 4.4  CL 93* 94*  CO2 34* 34*  GLUCOSE 234* 227*  BUN 19 20  CREATININE 1.00 1.02*  CALCIUM 9.1 9.0  MG 2.9* 2.4  PHOS 2.5 2.5   Liver Function Tests No results for input(s): AST, ALT, ALKPHOS, BILITOT, PROT, ALBUMIN in the last 72 hours. No results for input(s): LIPASE, AMYLASE in the last 72 hours. Cardiac Enzymes No results for input(s): CKTOTAL, CKMB, CKMBINDEX, TROPONINI in the last 72 hours.   Telemetry    No further vt  Radiology    DG Abd 1 View  Result Date: 12/02/2019 CLINICAL DATA:  Orogastric tube placement. EXAM: ABDOMEN - 1 VIEW COMPARISON:  None. FINDINGS: Evident of patient's enteric tube and the care 8 coiled configuration over the stomach with tip over the right mid abdomen likely over the distal stomach. Bowel gas pattern is nonobstructive. Remainder the  exam is unremarkable. IMPRESSION: Nonobstructive bowel gas pattern. Enteric tube coiled several times over the stomach with tip right of midline in the mid abdomen likely over the distal stomach. Electronically Signed   By: Marin Olp M.D.   On: 12/02/2019 11:20   DG CHEST PORT 1 VIEW  Result Date: 12/03/2019 CLINICAL DATA:  Intra-aortic balloon pump. EXAM: PORTABLE CHEST 1 VIEW COMPARISON:  12/02/2019 FINDINGS: Endotracheal tube unchanged. Findings suggesting enteric tube coiled over the stomach with proximal port projecting over the body of the stomach and tip not visualized. Right-sided PICC line unchanged. Dual lead left-sided pacemaker unchanged. Intra-aortic balloon pump with tip over the descending thoracic aorta at the T7-8 level. Lungs are hypoinflated without focal airspace consolidation or effusion. Mild stable cardiomegaly. Remainder of the exam is unchanged. IMPRESSION: 1.  Hypoinflation without acute cardiopulmonary disease. 2.  Multiple tubes and lines as described. Electronically Signed   By: Marin Olp M.D.   On: 12/03/2019 08:09   DG CHEST PORT 1 VIEW  Result Date: 12/02/2019 CLINICAL DATA:  54 year old female with history of sudden cardiac arrest. Evaluate endotracheal tube placement. EXAM: PORTABLE CHEST 1 VIEW COMPARISON:  Chest x-ray 12/02/2019. FINDINGS: An endotracheal tube is in place with tip 2.1 cm above the carina. Swan-Ganz catheter noted via inferior vena cava approach, with tip projecting over a right descending pulmonary artery branch. There is a right upper extremity PICC with tip terminating in the distal superior vena cava. Transcutaneous defibrillator pad projecting over the left hemithorax. Left-sided pacemaker device in place with lead tips projecting over the expected location of the right atrium and right ventricle. Lung volumes are low. No consolidative airspace disease. No pleural effusions. No pneumothorax. No evidence of pulmonary edema. Heart size is mildly  enlarged. Upper mediastinal contours are within normal limits. IMPRESSION: 1. Support apparatus, as above. 2. Mild cardiomegaly. Electronically Signed   By: Vinnie Langton M.D.   On: 12/02/2019 10:08   DG Chest Port 1 View  Result Date: 12/02/2019 CLINICAL DATA:  Status post heart catheterization. EXAM: PORTABLE CHEST 1 VIEW COMPARISON:  12/01/2019 FINDINGS: There is a left chest wall pacer device with leads in the right atrial appendage and right ventricle. Pulmonary arterial catheter is identified with tip in the right lower lobe pulmonary artery. Intra-aortic balloon pump is noted below the level of the aortic  knob at the T7 level. Stable cardiac enlargement. No pleural effusions or edema. No airspace consolidation. IMPRESSION: 1. Satisfactory position of intra-aortic balloon pump. 2. Cardiac enlargement. Electronically Signed   By: Kerby Moors M.D.   On: 12/02/2019 09:35   Chest Xray personally reviewed  The midline catheter is NOT in RA  SG this am in the lung Patient Profile        Xian Apostol Johnsonis a 54 y.o.femalewith a history of CHB s/p Medtronic dual chamber PPM 02/2019, SCAF, aortic insuffiency, HTN, HLD, T2DM, and COVID infection 4/2021who is being seen today for the evaluation of Wide complex tachycardiaat the request of Dr. Aundra Dubin.  Assessment & Plan    1.VT, recurrent, multiple morphologies - sustained  ? VT PM with some long short coupling ? TdP    2. CHB s/p MDT DDD pacer    3. Acute systolic CHF now IABP    4. Hypokalemia/Hypomagnesemia repleted    5. AKI    QT has normalized.  Not entirely convinced that the mechanism of VT was TdP-- will have colleagues review in am as  Concerning mechanism of arrhyhtmia for TdP with QTc nterval > 500 mc and some long-short coupling but the couipling interval is extremely long which raises the concern as to themechanism  QTc now normal with lido, increased pacing rate and stopping amio Will stop lido today, and plan to  decrease pacing rate in am  Will need weaning from IABP and then cMRI to try and make sense of the pathology of her cardiomyopathy-- pacing v sarcoid v giant cell  Continue prednisone for now    Signed, Virl Axe, MD  12/03/2019

## 2019-12-03 NOTE — Procedures (Signed)
Extubation Procedure Note  Patient Details:   Name: PREZLEY QADIR DOB: 1966-04-13 MRN: [id number]   Airway Documentation:    Vent end date: 12/03/19 Vent end time: 1120   Evaluation  O2 sats: stable throughout Complications: No apparent complications Patient did tolerate procedure well. Bilateral Breath Sounds: Clear   Yes   Pt extubated per MD order.  Pt placed on 4L [place].  RN at bedside.  RT will continue to monitor.  Valora Piccolo 12/03/2019, 11:29 AM

## 2019-12-03 NOTE — Progress Notes (Signed)
Wasted 75 mL of Fentanyl into Stericycle after Fentanyl drip was D/C'd.  Witnessed by Luberta Mutter, RN.

## 2019-12-03 NOTE — Progress Notes (Addendum)
[place] for heparin Indication: IABP  Allergies  Allergen Reactions  . Amiodarone Other (See Comments)    Torsades on amiodarone   . Lisinopril Other (See Comments) and Cough    Flu like symptoms, also   . Ace Inhibitors Other (See Comments)    Flu-like symptoms  . Augmentin [Amoxicillin-Pot Clavulanate] Nausea And Vomiting  . Benazepril Other (See Comments)    Flu like symptoms  . Lotensin [Benazepril Hcl] Other (See Comments)    Flu like symptoms  . Oxycodone-Acetaminophen Nausea And Vomiting  . Sulfa Antibiotics Nausea And Vomiting    Patient Measurements: Height: 5' (152.4 cm) Weight: 81.7 kg (180 lb 1.9 oz) IBW/kg (Calculated) : 45.5  Vital Signs: Temp: 98.4 F (36.9 C) (07/25 2100) Temp Source: Core (07/25 1600) Pulse Rate: 49 (07/25 2100)  Labs: Recent Labs    12/02/19 0339 12/02/19 0926 12/02/19 0926 12/02/19 1106 12/02/19 1106 12/02/19 1323 12/02/19 1709 12/03/19 0340 12/03/19 1839 12/03/19 2039  HGB  --  13.9   < > 14.3   < >  --   --  14.2  --  14.1  HCT  --  45.4   < > 42.0  --   --   --  45.2  --  44.5  PLT  --  250  --   --   --   --   --  242  --  259  HEPARINUNFRC   < >  --   --   --   --  0.38  --  0.31 0.29*  --   CREATININE  --  1.04*  --   --   --   --  1.00 1.02*  --   --    < > = values in this interval not displayed.    Estimated Creatinine Clearance: 59 mL/min (A) (by C-G formula based on SCr of 1.02 mg/dL (H)).   Medical History: Past Medical History:  Diagnosis Date  . Aortic insufficiency   . Asthma 09/13/2006  . Complete heart block ([place]) 02/11/2019  . Cystocele without uterine prolapse 12/12/2018  . Diabetes mellitus without complication ([place])   . Elevated troponin level   . Hypercholesteremia   . Hypertension   . Primary osteoarthritis of left knee 12/10/2017   Assessment: 54yof admitted with VT, s/p cath lab with normal coronaries but high pressures and low cardiac output - IABP  placed.  Had episodes of torsades/VT 7/24, now stable. Heparin level is therapeutic at 0.29 on 750 units/hr. Hgb, plt stable.  Patient developed an 8cm hematoma at IABP insertion site around 20:30. Per MD, hold heparin overnight and reassess in the morning.   Goal of Therapy:  Heparin level 0.2-0.5 units/ml Monitor platelets by anticoagulation protocol: Yes   Plan:  Stop heparin infusion F/u hematoma and plan in AM    Benetta Spar, PharmD, BCPS, [place] Pharmacist  Please check AMION for all [place] phone numbers After 10:00 PM, call [place] [phone]

## 2019-12-03 NOTE — Progress Notes (Deleted)
[place] for heparin Indication: IABP  Allergies  Allergen Reactions  . Amiodarone Other (See Comments)    Torsades on amiodarone   . Lisinopril Other (See Comments) and Cough    Flu like symptoms, also   . Ace Inhibitors Other (See Comments)    Flu-like symptoms  . Augmentin [Amoxicillin-Pot Clavulanate] Nausea And Vomiting  . Benazepril Other (See Comments)    Flu like symptoms  . Lotensin [Benazepril Hcl] Other (See Comments)    Flu like symptoms  . Oxycodone-Acetaminophen Nausea And Vomiting  . Sulfa Antibiotics Nausea And Vomiting    Patient Measurements: Height: 5' (152.4 cm) Weight: 81.7 kg (180 lb 1.9 oz) IBW/kg (Calculated) : 45.5  Vital Signs: Temp: 98.4 F (36.9 C) (07/25 2100) Temp Source: Core (07/25 1600) Pulse Rate: 49 (07/25 2100)  Labs: Recent Labs    12/02/19 0339 12/02/19 0926 12/02/19 0926 12/02/19 1106 12/02/19 1106 12/02/19 1323 12/02/19 1709 12/03/19 0340 12/03/19 1839 12/03/19 2039  HGB  --  13.9   < > 14.3   < >  --   --  14.2  --  14.1  HCT  --  45.4   < > 42.0  --   --   --  45.2  --  44.5  PLT  --  250  --   --   --   --   --  242  --  259  HEPARINUNFRC   < >  --   --   --   --  0.38  --  0.31 0.29*  --   CREATININE  --  1.04*  --   --   --   --  1.00 1.02*  --   --    < > = values in this interval not displayed.    Estimated Creatinine Clearance: 59 mL/min (A) (by C-G formula based on SCr of 1.02 mg/dL (H)).   Medical History: Past Medical History:  Diagnosis Date  . Aortic insufficiency   . Asthma 09/13/2006  . Complete heart block ([place]) 02/11/2019  . Cystocele without uterine prolapse 12/12/2018  . Diabetes mellitus without complication ([place])   . Elevated troponin level   . Hypercholesteremia   . Hypertension   . Primary osteoarthritis of left knee 12/10/2017   Assessment: 54yof admitted with VT, s/p cath lab with normal coronaries but high pressures and low cardiac output - IABP  placed.  Had episodes of torsades/VT 7/24, now stable. Heparin level is therapeutic at 0.29 on 750 units/hr. Patient developed hematoma at IABP insertion site around 20:30. Hgb, plt stable. Will decrease rate slightly to keep at low end of goal.   Goal of Therapy:  Heparin level 0.2-0.5 units/ml Monitor platelets by anticoagulation protocol: Yes   Plan:  Continue heparin drip at 700 units/hr  Monitor daily HL, CBC/plt Monitor for signs/symptoms of bleeding    Benetta Spar, PharmD, BCPS, BCCP Clinical Pharmacist  Please check AMION for all [place] phone numbers After 10:00 PM, call [place]

## 2019-12-03 NOTE — Progress Notes (Signed)
NAME:  Lisa Poole, MRN:  [id number], DOB:  1965-07-03, LOS: 7 ADMISSION DATE:  11/26/2019, CONSULTATION DATE:  11/28/2019 REFERRING MD:  Charissa Bash, MD  CHIEF COMPLAINT: Cardiogenic shock  Brief History   Patient transferred to ICU for cardiogenic shock.  History of present illness   Lisa Poole presented to emergency department on 7/18 for evaluation of shortness of breath, lower extremity swelling, and not feeling right.  Patient had signs of worsening heart failure including orthopnea, weight gain, elevated BNP, and pulmonary edema on chest x-ray.  She had episodes of V. tach 7/19 and cardiology was consulted and TTE ordered.    Of note , patient has a pmhx of complete heart block s/p medtronic dual chamber pacemaker 02/2019. No specific etiology for causing complete heart block was identified. She also has history of Covid-19 infection 08/2019, not requiring hospitilization.   TTE showed an EF 20 to 25% compared to echo in 2020 which showed EF 60 - 65%.  The echo also showed regional wall motion abnormalities (anterior, septal and lateral).  Moderate to severe TR with elevated RVSP,  and moderate MR. She was being dirueesed and started on amiodarone for NSVT.Overnight patient developed cardiogenic shock with hypotension and narrow pulse pressure with lactic acidosis and AKI. Dopamine was started and BP improved. With recurrent episodes of NSVT patient was transferred to ICU.     Denies any cough, fever, difficulty urinating before admission, rashes, joint pain, or recent changes in bowl movements.  Past Medical History  Past Medical History: No date: Aortic insufficiency 09/13/2006: Asthma 02/11/2019: Complete heart block ([place]) 12/12/2018: Cystocele without uterine prolapse No date: Diabetes mellitus without complication ([facility]) No date: Elevated troponin level No date: Hypercholesteremia No date: Hypertension 12/10/2017: Primary osteoarthritis of left knee  Significant Hospital Events     7/18 admit 7/19 cardiogenic shock 7/20 transferred to from ED to ICU 7/23 Cardiac catheterization yesterday showed nonobstructive coronary disease.  Low cardiac index at 1.5.  Elevated PCWP at 27. IABP placed 7/24 intubated for recurrent torsade.  Pacing rate increased.  Started on lidocaine.  Amiodarone stopped. Consults:  EP cardiology HF cardiology Procedures:  7/23: RHC/LHC and coronary angiography 7/23: IABP   Significant Diagnostic Tests:  TTE: EF severely reduced , 20-25%, compared to 60-65% one year ago  Micro Data:  MRSA PCR: Negative SARS coronavirus 2: Negative Antimicrobials:  N/A  Interim history/subjective:   No further dysrhythmias.  QTC has corrected. Patient awake following commands.  Objective   Blood pressure (!) 116/64, pulse 100, temperature 98.8 F (37.1 C), temperature source Core, resp. rate 13, height 5' (1.524 m), weight 81.7 kg, last menstrual period 07/25/2015, SpO2 97 %. PAP: (25-53)/(9-36) 47/28 CVP:  [3 mmHg-18 mmHg] 3 mmHg CO:  [5 L/min] 5 L/min CI:  [2.7 L/min/m2] 2.7 L/min/m2  Vent Mode: CPAP;PSV FiO2 (%):  [40 %-50 %] 40 % Set Rate:  [16 bmp] 16 bmp Vt Set:  [370 mL] 370 mL PEEP:  [5 cmH20] 5 cmH20 Pressure Support:  [5 cmH20] 5 cmH20 Plateau Pressure:  [14 cmH20-15 cmH20] 14 cmH20   Intake/Output Summary (Last 24 hours) at 12/03/2019 1624 Last data filed at 12/03/2019 1600 Gross per 24 hour  Intake 2265.19 ml  Output 5400 ml  Net -3134.81 ml   Filed Weights   12/01/19 0346 12/02/19 0600 12/03/19 0600  Weight: 88.7 kg 84.7 kg 81.7 kg   PAP: (25-53)/(9-36) 47/28 CVP:  [3 mmHg-18 mmHg] 3 mmHg CO:  [5 L/min] 5 L/min CI:  [  2.7 L/min/m2] 2.7 L/min/m2  CI: 1.6 SVR 1759  Examination: General: NAD, mild obesity. HEENT: ET tube OG tube in place. Cardiovascular:  Heart sounds muffled by intra-aortic balloon pump. AV paced.  Peripheral pulses intact.  JVP not visible. Pulmonary : CTAB. Normal respiratory effort.   Abdominal:  soft, bowel sounds present Musculoskeletal: No edema. No deformity, injury  Psychiatric/Behavioral: [facility] Problem list     Assessment & Plan:   Critically ill due to cardiogenic shock secondary to nonischemic cardiomyopathy requiring IABP support. -Switch to 1-2 augmentation.  Possible balloon pump removal tomorrow -Started Cozaar for afterload reduction.  Nonischemic cardiomyopathy.  Etiology unclear. Given pre-existing heart block, possible cardiac sarcoidosis. -We will initiate empiric steroids at 40 mg twice daily -duration of therapy to be determined based on cardiac MR and PET scan. -Continue spironolactone and digoxin. -She will need a cardiac MRI  Critically ill due to acute hypoxic respiratory failure requiring mechanical ventilation -Successfully extubated today following SBT  Critically ill due to recurrent torsade de pointes secondary to amiodarone requiring initiation of lidocaine, increased pacing rate and repletion of magnesium. -Lidocaine stopped -Drop pacing rate back to 70 bpm tomorrow -Repleted magnesium -Discontinue amiodarone.  QTC warning in drug allergies.  Improving AKI.  Likely cardiorenal syndrome -Convert to intermittent diuretics.  Severe hypokalemia. -Replete both potassium and magnesium.  Type 2 diabetes   Daily Goals Checklist  Pain/Anxiety/Delirium protocol (if indicated): Fentanyl infusion, Versed as needed VAP protocol (if indicated): Bundle in place. Respiratory support goals: Full ventilatory support. Blood pressure target: Currently on no vasoactive support.  Continue IABP DVT prophylaxis: On IV heparin. Nutrition Status: Initiate enteral nutrition. GI prophylaxis: Start PPI Fluid status goals: Continue furosemide infusion targeting -0.5 to 1 L Urinary catheter: Guide hemodynamic management Central lines: Left femoral art line, right femoral and Swan-Ganz catheter Glucose control: Type 2 diabetes with  adequate blood sugar control on insulin correction scale.  Will likely increase on steroids. Mobility/therapy needs: Bedrest Antibiotic de-escalation: No antibiotics Home medication reconciliation: Reconciled. Daily labs: CBC, BMP Code Status: Full code Family Communication: Husband updated Disposition: ICU   Labs   CBC: Recent Labs  Lab 11/29/19 0420 11/29/19 0420 11/30/19 0350 11/30/19 0350 12/01/19 0328 12/01/19 0844 12/01/19 0845 12/02/19 0339 12/02/19 0926 12/02/19 1106 12/03/19 0340  WBC 8.0   < > 6.7  --  9.7  --   --  6.8 8.7  --  11.1*  NEUTROABS 5.1  --  3.2  --  5.4  --   --  3.6  --   --  10.0*  HGB 12.0   < > 12.5   < > 14.2   < > 16.7* 13.3 13.9 14.3 14.2  HCT 39.1   < > 40.9   < > 45.8   < > 49.0* 43.1 45.4 42.0 45.2  MCV 87.7   < > 88.3  --  88.2  --   --  86.9 87.0  --  86.4  PLT 186   < > 204  --  272  --   --  215 250  --  242   < > = values in this interval not displayed.    Basic Metabolic Panel: Recent Labs  Lab 11/27/19 1927 11/28/19 0215 11/28/19 0535 11/28/19 1500 12/01/19 0328 12/01/19 0844 12/01/19 1809 12/01/19 1809 12/02/19 0339 12/02/19 0926 12/02/19 1106 12/02/19 1709 12/03/19 0340  NA 139   < > 138   < > 137   < >  133*   < > 139 137 137 136 138  K 4.0   < > 4.6   < > 4.3   < > 3.9   < > 3.7 3.1* 2.4* 4.9 4.4  CL 98   < > 95*   < > 97*  --  91*  --  93* 92*  --  93* 94*  CO2 27   < > 26   < > 28  --  31  --  34* 33*  --  34* 34*  GLUCOSE 134*   < > 139*   < > 168*  --  241*  --  109* 266*  --  234* 227*  BUN 24*   < > 28*   < > 25*  --  22*  --  21* 20  --  19 20  CREATININE 1.85*   < > 2.16*   < > 1.19*  --  1.33*  --  1.17* 1.04*  --  1.00 1.02*  CALCIUM 8.8*   < > 9.3   < > 8.8*  --  8.3*  --  8.8* 8.8*  --  9.1 9.0  MG 2.3  --  1.9   < > 1.8  --   --   --  1.8 1.7  --  2.9* 2.4  PHOS 3.9  --  4.4  --   --   --   --   --   --  2.8  --  2.5 2.5   < > = values in this interval not displayed.   GFR: Estimated Creatinine  Clearance: 59 mL/min (A) (by C-G formula based on SCr of 1.02 mg/dL (H)). Recent Labs  Lab 11/28/19 0535 11/28/19 0612 11/28/19 0813 11/28/19 1413 11/28/19 1713 11/29/19 0420 12/01/19 0328 12/02/19 0339 12/02/19 0926 12/03/19 0340  WBC  --    < >  --   --   --    < > 9.7 6.8 8.7 11.1*  LATICACIDVEN 4.0*  --  2.5* 2.1* 2.2*  --   --   --   --   --    < > = values in this interval not displayed.    Liver Function Tests: Recent Labs  Lab 11/28/19 0215 11/28/19 0535  AST 55* 103*  ALT 26 50*  ALKPHOS 83 85  BILITOT 2.8* 3.4*  PROT 6.5 6.6  ALBUMIN 3.2* 3.3*   No results for input(s): LIPASE, AMYLASE in the last 168 hours. No results for input(s): AMMONIA in the last 168 hours.  ABG    Component Value Date/Time   PHART 7.520 (H) 12/02/2019 1106   PCO2ART 51.1 (H) 12/02/2019 1106   PO2ART 482 (H) 12/02/2019 1106   HCO3 41.9 (H) 12/02/2019 1106   TCO2 43 (H) 12/02/2019 1106   O2SAT 77.1 12/03/2019 0340     Coagulation Profile: No results for input(s): INR, PROTIME in the last 168 hours.  Cardiac Enzymes: No results for input(s): CKTOTAL, CKMB, CKMBINDEX, TROPONINI in the last 168 hours.  HbA1C: Hgb A1c MFr Bld  Date/Time Value Ref Range Status  11/28/2019 06:19 PM 7.2 (H) 4.8 - 5.6 % Final    Comment:    (NOTE) Pre diabetes:          5.7%-6.4%  Diabetes:              >6.4%  Glycemic control for   <7.0% adults with diabetes   11/27/2019 04:02 AM 7.1 (H) 4.8 - 5.6 % Final  Comment:    (NOTE) Pre diabetes:          5.7%-6.4%  Diabetes:              >6.4%  Glycemic control for   <7.0% adults with diabetes     CBG: Recent Labs  Lab 12/02/19 1943 12/02/19 2344 12/03/19 0353 12/03/19 1311 12/03/19 1535  GLUCAP 225* 255* 215* 110* 102*    CRITICAL CARE Performed by: Kipp Brood   Total critical care time: 45 minutes  Critical care time was exclusive of separately billable procedures and treating other patients.  Critical care was  necessary to treat or prevent imminent or life-threatening deterioration.  Critical care was time spent personally by me on the following activities: development of treatment plan with patient and/or surrogate as well as nursing, discussions with consultants, evaluation of patient's response to treatment, examination of patient, obtaining history from patient or surrogate, ordering and performing treatments and interventions, ordering and review of laboratory studies, ordering and review of radiographic studies, pulse oximetry, re-evaluation of patient's condition and participation in multidisciplinary rounds.  Kipp Brood, MD [facility] ICU Physician [place]  Pager: [phone] Mobile: [phone] After hours: [phone].  12/03/2019, 4:24 PM

## 2019-12-03 NOTE — Progress Notes (Signed)
[place] for heparin Indication: IABP  Allergies  Allergen Reactions  . Amiodarone Other (See Comments)    Torsades on amiodarone   . Lisinopril Other (See Comments) and Cough    Flu like symptoms, also   . Ace Inhibitors Other (See Comments)    Flu-like symptoms  . Augmentin [Amoxicillin-Pot Clavulanate] Nausea And Vomiting  . Benazepril Other (See Comments)    Flu like symptoms  . Lotensin [Benazepril Hcl] Other (See Comments)    Flu like symptoms  . Oxycodone-Acetaminophen Nausea And Vomiting  . Sulfa Antibiotics Nausea And Vomiting    Patient Measurements: Height: 5' (152.4 cm) Weight: 81.7 kg (180 lb 1.9 oz) IBW/kg (Calculated) : 45.5  Vital Signs: Temp: 98.4 F (36.9 C) (07/25 1000) Temp Source: Core (07/25 0800) BP: 116/64 (07/24 2347) Pulse Rate: 100 (07/25 1000)  Labs: Recent Labs    12/02/19 0339 12/02/19 0339 12/02/19 0926 12/02/19 0926 12/02/19 1106 12/02/19 1323 12/02/19 1709 12/03/19 0340  HGB 13.3   < > 13.9   < > 14.3  --   --  14.2  HCT 43.1   < > 45.4  --  42.0  --   --  45.2  PLT 215  --  250  --   --   --   --  242  HEPARINUNFRC 0.55  --   --   --   --  0.38  --  0.31  CREATININE 1.17*   < > 1.04*  --   --   --  1.00 1.02*   < > = values in this interval not displayed.    Estimated Creatinine Clearance: 59 mL/min (A) (by C-G formula based on SCr of 1.02 mg/dL (H)).   Medical History: Past Medical History:  Diagnosis Date  . Aortic insufficiency   . Asthma 09/13/2006  . Complete heart block ([place]) 02/11/2019  . Cystocele without uterine prolapse 12/12/2018  . Diabetes mellitus without complication ([place])   . Elevated troponin level   . Hypercholesteremia   . Hypertension   . Primary osteoarthritis of left knee 12/10/2017   Assessment: 54yof admitted with VT, s/p cath lab with normal coronaries but high pressures and low cardiac output - IABP placed.  Had episodes of torsades/VT yesterday, now  stable. Heparin level is therapeutic at 0.31, on 750 units/hr. Hgb 14.2, plt 242. No s/sx of bleeding or infusion issues.   Goal of Therapy:  Heparin level 0.2-0.5 units/ml Monitor platelets by anticoagulation protocol: Yes   Plan:  Continue heparin drip at 750 units/hr  Will recheck heparin level with am labs CBC, HL daily  Antonietta Jewel, PharmD, [place] Pharmacist  Phone: [phone] 12/03/2019 10:47 AM  Please check AMION for all [place] phone numbers After 10:00 PM, call [place] [phone]

## 2019-12-03 NOTE — Progress Notes (Signed)
Patient ID: Lisa Poole, female   DOB: 05-14-1965, 54 y.o.   MRN: [id number]     Advanced Heart Failure Rounding Note  PCP-Cardiologist: No primary care provider on file.   Subjective:    Remains on IABP 1:1  Developed recurrent VT/torsades x 3 on 7/24. Had brief CPR and shocked. QT > 641m. Subsequently intubated. A-paced at 100. Amio switched to lidocaine. Mag supped. Solumedrol started for presumed cardiac sarcoid.  Extubated this am. No further VT. A-paced at 100. Feels much better. Dr. KCaryl Comesstopped lido. Denies SOB/CP. Weight down 18 pounds with lasix gtt. CVP now 4-5  Co-ox 77%    Swan#s CVP 4-5 CO/CI 5/2.7  Objective:   Weight Range: 81.7 kg Body mass index is 35.18 kg/m.   Vital Signs:   Temp:  [97.7 F (36.5 C)-98.8 F (37.1 C)] 98.1 F (36.7 C) (07/25 1200) Pulse Rate:  [29-110] 97 (07/25 1200) Resp:  [15-25] 17 (07/25 1200) BP: (110-141)/(59-73) 116/64 (07/24 2347) SpO2:  [95 %-100 %] 99 % (07/25 1200) Arterial Line BP: (88-138)/(48-77) 125/67 (07/25 1200) FiO2 (%):  [40 %-50 %] 40 % (07/25 0830) Weight:  [81.7 kg] 81.7 kg (07/25 0600) Last BM Date: 11/30/19  Weight change: Filed Weights   12/01/19 0346 12/02/19 0600 12/03/19 0600  Weight: 88.7 kg 84.7 kg 81.7 kg    Intake/Output:   Intake/Output Summary (Last 24 hours) at 12/03/2019 1413 Last data filed at 12/03/2019 1200 Gross per 24 hour  Intake 2310.28 ml  Output 4650 ml  Net -2339.72 ml      Physical Exam    General:  Well appearing. No resp difficulty HEENT: normal Neck: supple. no JVD. Carotids 2+ bilat; no bruits. No lymphadenopathy or thryomegaly appreciated. Cor: PMI nondisplaced. Regular tachy Lungs: clear Abdomen: soft, nontender, nondistended. No hepatosplenomegaly. No bruits or masses. Good bowel sounds. Extremities: no cyanosis, clubbing, rash, edema RFA IAB  RFV swan  Neuro: alert & orientedx3, cranial nerves grossly intact. moves all 4 extremities w/o difficulty. Affect  pleasant   Telemetry   A-paced 100 Personally reviewed    Labs    CBC Recent Labs    12/02/19 0339 12/02/19 0339 12/02/19 0926 12/02/19 0926 12/02/19 1106 12/03/19 0340  WBC 6.8   < > 8.7  --   --  11.1*  NEUTROABS 3.6  --   --   --   --  10.0*  HGB 13.3   < > 13.9   < > 14.3 14.2  HCT 43.1   < > 45.4   < > 42.0 45.2  MCV 86.9   < > 87.0  --   --  86.4  PLT 215   < > 250  --   --  242   < > = values in this interval not displayed.   Basic Metabolic Panel Recent Labs    12/02/19 1709 12/03/19 0340  NA 136 138  K 4.9 4.4  CL 93* 94*  CO2 34* 34*  GLUCOSE 234* 227*  BUN 19 20  CREATININE 1.00 1.02*  CALCIUM 9.1 9.0  MG 2.9* 2.4  PHOS 2.5 2.5   Liver Function Tests No results for input(s): AST, ALT, ALKPHOS, BILITOT, PROT, ALBUMIN in the last 72 hours. No results for input(s): LIPASE, AMYLASE in the last 72 hours. Cardiac Enzymes No results for input(s): CKTOTAL, CKMB, CKMBINDEX, TROPONINI in the last 72 hours.  BNP: BNP (last 3 results) Recent Labs    11/26/19 2011  BNP 1,258.1*    ProBNP (  last 3 results) No results for input(s): PROBNP in the last 8760 hours.   D-Dimer No results for input(s): DDIMER in the last 72 hours. Hemoglobin A1C No results for input(s): HGBA1C in the last 72 hours. Fasting Lipid Panel No results for input(s): CHOL, HDL, LDLCALC, TRIG, CHOLHDL, LDLDIRECT in the last 72 hours. Thyroid Function Tests No results for input(s): TSH, T4TOTAL, T3FREE, THYROIDAB in the last 72 hours.  Invalid input(s): FREET3  Other results:   Imaging    DG CHEST PORT 1 VIEW  Result Date: 12/03/2019 CLINICAL DATA:  Intra-aortic balloon pump. EXAM: PORTABLE CHEST 1 VIEW COMPARISON:  12/02/2019 FINDINGS: Endotracheal tube unchanged. Findings suggesting enteric tube coiled over the stomach with proximal port projecting over the body of the stomach and tip not visualized. Right-sided PICC line unchanged. Dual lead left-sided pacemaker  unchanged. Intra-aortic balloon pump with tip over the descending thoracic aorta at the T7-8 level. Lungs are hypoinflated without focal airspace consolidation or effusion. Mild stable cardiomegaly. Remainder of the exam is unchanged. IMPRESSION: 1.  Hypoinflation without acute cardiopulmonary disease. 2.  Multiple tubes and lines as described. Electronically Signed   By: Marin Olp M.D.   On: 12/03/2019 08:09     Medications:     Scheduled Medications: . aspirin  81 mg Per Tube Daily  . chlorhexidine gluconate (MEDLINE KIT)  15 mL Mouth Rinse BID  . Chlorhexidine Gluconate Cloth  6 each Topical Daily  . digoxin  0.125 mg Per Tube Daily  . docusate  100 mg Per Tube BID  . fentaNYL (SUBLIMAZE) injection  50 mcg Intravenous Once  . gabapentin  600 mg Per Tube BID  . hydrALAZINE  5 mg Per Tube Q8H  . insulin aspart  0-15 Units Subcutaneous Q4H  . insulin detemir  7 Units Subcutaneous BID  . isosorbide mononitrate  15 mg Oral Daily  . mouth rinse  15 mL Mouth Rinse 10 times per day  . methylPREDNISolone (SOLU-MEDROL) injection  40 mg Intravenous Q12H  . pantoprazole sodium  40 mg Per Tube Daily  . polyethylene glycol  17 g Oral Daily  . potassium chloride  40 mEq Per Tube BID  . pravastatin  10 mg Per Tube q1800  . sodium chloride flush  10-40 mL Intracatheter Q12H  . sodium chloride flush  3 mL Intravenous Q12H  . spironolactone  12.5 mg Per Tube Daily    Infusions: . sodium chloride 10 mL/hr at 12/02/19 1414  . furosemide (LASIX) infusion 15 mg/hr (12/03/19 1200)  . heparin 750 Units/hr (12/03/19 1200)    PRN Medications: Place/Maintain arterial line **AND** sodium chloride, acetaminophen, ipratropium-albuterol, midazolam, midazolam, sodium chloride flush    Assessment/Plan   1. Acute systolic CHF: Nonischemic cardiomyopathy.  Echo this admission with EF newly decreased to 20-25% with septal-lateral dyssynchrony and septal severe hypokinesis, normal RV, moderate TR,  moderate MR. Echo in 5/20 prior to PPM placement showed EF 60-65%.  Possible causes of cardiomyopathy include chronic RV pacing, frequent NSVT/PVCs, ?CAD (has RFs), ?viral myocarditis (relatively recent COVID-19 infection).  No ETOH/drugs.  She has had increased dyspnea/palpitations x several weeks. Volume overloaded and CXR suggested pulmonary edema at time of admit. Developed CGS overnight 7/20 w/ hypotension and narrow pulse pressure, w/ subsequent AKI, SCr 1.18>>1.85>>2.26. Lactic acid increased from 2.1>>5.0. Diuresis subsequently limited. She was initially on dopamine, then norepinephrine, milrinone, and back to norepinephrine.  Least ventricular ectopy on norepinephrine.  LHC/RHC 7/23 with no significant CAD, elevated filling pressures, and low cardiac output  on norepinephrine. IABP placed.Currently off NE and milrinone.  - IABP at 1:1.  - Volume status much improved on lasix gtt. Creatinine stable. Will continue diuresis through this evening and stop  - Continue digoxin 0.125 daily.  - Continue spironolactone.   - Hydralazine added today. Will switch to low-dose losartan - Will need cardiac MRI (device is compatible) when more stable and IABP out => look for infiltrative disease like sarcoid (we do not know why she had CHB) as well as evidence for prior myocarditis. Giant cell also a concern but timeline not consistent with this   - She has left bundle pacing as an alternative to CRT => QRS in 130s.  She will need ICD upgrade, so will review with EP whether she should be upgraded as well to traditional CRT.  - Solumedrol started 7/24 for presumed sarcoid. Hopefully we can remove IABP in am and get cMRI tomorrow - Cardiomyopathy may be related to frequent VT/PVCs.  2. Wide complex tachycardia: VT with multiple morphologies.  Increased ventricular arrhythmias over 6 months.  ?Underlying cause.  No significant coronary disease.  Consider infiltrative disease like sarcoidosis.  Increased VT runs with  milrinone and dopamine, off both, did better with norepinephrine.  Now with IABP.   Torsades/VT x 3 on 7/24 with prolonged QT required DC-CV. Amio switchted to lido. Lido stopped today by Dr. Caryl Comes.  - Continue atrial pacing at 100 - Keep K > 4.0 Mg > 2.0 - EP following. - Check ESR - Solumedrol started 7/24 for presumed sarcoid. Hopefully we can remove IABP in am and get cMRI tomorrow - Will eventually need cardiac PET as well as ICD upgrade.  3. H/o CHB: Cause is uncertain.  Cath in 5/20 prior to PPM showed no significant disease.  - Eventual cardiac MRI to look for infiltrative disease such as sarcoidosis.  4. AKI: In setting of hypotension.  Creatinine back to baseline with hemodynamic support   Length of Stay: 7  CRITICAL CARE Performed by: Glori Bickers  Total critical care time: 35 minutes  Critical care time was exclusive of separately billable procedures and treating other patients.  Critical care was necessary to treat or prevent imminent or life-threatening deterioration.  Critical care was time spent personally by me (independent of midlevel providers or residents) on the following activities: development of treatment plan with patient and/or surrogate as well as nursing, discussions with consultants, evaluation of patient's response to treatment, examination of patient, obtaining history from patient or surrogate, ordering and performing treatments and interventions, ordering and review of laboratory studies, ordering and review of radiographic studies, pulse oximetry and re-evaluation of patient's condition.    Quillian Quince Thora Scherman 12/03/2019 2:13 PM

## 2019-12-03 NOTE — Progress Notes (Signed)
Pt with a new onset of lower Rt abdominal pain above IABP insertion site. Upon inspection the lower abdomen was firm and painful to palpitation with minimal bruising above site. A stat CBC sent for verification of H&H. Page placed to on call Cards fellow.

## 2019-12-04 ENCOUNTER — Encounter (HOSPITAL_COMMUNITY): Payer: Self-pay | Admitting: Internal Medicine

## 2019-12-04 DIAGNOSIS — I442 Atrioventricular block, complete: Secondary | ICD-10-CM

## 2019-12-04 LAB — COOXEMETRY PANEL
Carboxyhemoglobin: 2.1 % — ABNORMAL HIGH (ref 0.5–1.5)
Methemoglobin: 0.8 % (ref 0.0–1.5)
O2 Saturation: 81.4 %
Total hemoglobin: 12.9 g/dL (ref 12.0–16.0)

## 2019-12-04 LAB — POCT ACTIVATED CLOTTING TIME: Activated Clotting Time: 136 seconds

## 2019-12-04 LAB — CBC WITH DIFFERENTIAL/PLATELET
Abs Immature Granulocytes: 0.05 10*3/uL (ref 0.00–0.07)
Basophils Absolute: 0 10*3/uL (ref 0.0–0.1)
Basophils Relative: 0 %
Eosinophils Absolute: 0 10*3/uL (ref 0.0–0.5)
Eosinophils Relative: 0 %
HCT: 42.8 % (ref 36.0–46.0)
Hemoglobin: 13.5 g/dL (ref 12.0–15.0)
Immature Granulocytes: 0 %
Lymphocytes Relative: 9 %
Lymphs Abs: 1.1 10*3/uL (ref 0.7–4.0)
MCH: 27.2 pg (ref 26.0–34.0)
MCHC: 31.5 g/dL (ref 30.0–36.0)
MCV: 86.1 fL (ref 80.0–100.0)
Monocytes Absolute: 0.9 10*3/uL (ref 0.1–1.0)
Monocytes Relative: 7 %
Neutro Abs: 10.3 10*3/uL — ABNORMAL HIGH (ref 1.7–7.7)
Neutrophils Relative %: 84 %
Platelets: 238 10*3/uL (ref 150–400)
RBC: 4.97 MIL/uL (ref 3.87–5.11)
RDW: 15.2 % (ref 11.5–15.5)
WBC: 12.4 10*3/uL — ABNORMAL HIGH (ref 4.0–10.5)
nRBC: 0 % (ref 0.0–0.2)

## 2019-12-04 LAB — MAGNESIUM: Magnesium: 2 mg/dL (ref 1.7–2.4)

## 2019-12-04 LAB — GLUCOSE, CAPILLARY
Glucose-Capillary: 115 mg/dL — ABNORMAL HIGH (ref 70–99)
Glucose-Capillary: 155 mg/dL — ABNORMAL HIGH (ref 70–99)
Glucose-Capillary: 201 mg/dL — ABNORMAL HIGH (ref 70–99)
Glucose-Capillary: 222 mg/dL — ABNORMAL HIGH (ref 70–99)
Glucose-Capillary: 240 mg/dL — ABNORMAL HIGH (ref 70–99)
Glucose-Capillary: 243 mg/dL — ABNORMAL HIGH (ref 70–99)

## 2019-12-04 LAB — BASIC METABOLIC PANEL
Anion gap: 9 (ref 5–15)
BUN: 30 mg/dL — ABNORMAL HIGH (ref 6–20)
CO2: 34 mmol/L — ABNORMAL HIGH (ref 22–32)
Calcium: 8.8 mg/dL — ABNORMAL LOW (ref 8.9–10.3)
Chloride: 91 mmol/L — ABNORMAL LOW (ref 98–111)
Creatinine, Ser: 1.47 mg/dL — ABNORMAL HIGH (ref 0.44–1.00)
GFR calc Af Amer: 46 mL/min — ABNORMAL LOW (ref >60)
GFR calc non Af Amer: 40 mL/min — ABNORMAL LOW (ref >60)
Glucose, Bld: 228 mg/dL — ABNORMAL HIGH (ref 70–99)
Potassium: 4.3 mmol/L (ref 3.5–5.1)
Sodium: 134 mmol/L — ABNORMAL LOW (ref 135–145)

## 2019-12-04 MED ORDER — INSULIN ASPART 100 UNIT/ML ~~LOC~~ SOLN
0.0000 [IU] | Freq: Every day | SUBCUTANEOUS | Status: DC
  Administered 2019-12-04: 2 [IU] via SUBCUTANEOUS

## 2019-12-04 MED ORDER — ADULT MULTIVITAMIN W/MINERALS CH
1.0000 | ORAL_TABLET | Freq: Every day | ORAL | Status: DC
  Administered 2019-12-04 – 2019-12-08 (×5): 1 via ORAL
  Filled 2019-12-04 (×5): qty 1

## 2019-12-04 MED ORDER — LORAZEPAM 2 MG/ML IJ SOLN
1.0000 mg | Freq: Once | INTRAMUSCULAR | Status: AC
  Administered 2019-12-04: 2 mg via INTRAVENOUS

## 2019-12-04 MED ORDER — ENSURE ENLIVE PO LIQD
237.0000 mL | Freq: Two times a day (BID) | ORAL | Status: DC
  Administered 2019-12-06: 237 mL via ORAL

## 2019-12-04 MED ORDER — LORAZEPAM 2 MG/ML IJ SOLN
INTRAMUSCULAR | Status: AC
  Filled 2019-12-04: qty 1

## 2019-12-04 MED ORDER — INSULIN ASPART 100 UNIT/ML ~~LOC~~ SOLN
0.0000 [IU] | Freq: Three times a day (TID) | SUBCUTANEOUS | Status: DC
  Administered 2019-12-04 (×2): 5 [IU] via SUBCUTANEOUS
  Administered 2019-12-05: 8 [IU] via SUBCUTANEOUS
  Administered 2019-12-05: 11 [IU] via SUBCUTANEOUS

## 2019-12-04 MED ORDER — METHYLPREDNISOLONE SODIUM SUCC 40 MG IJ SOLR
40.0000 mg | Freq: Every day | INTRAMUSCULAR | Status: DC
  Filled 2019-12-04: qty 1

## 2019-12-04 NOTE — Progress Notes (Addendum)
Progress Note  Patient Name: Lisa Poole Date of Encounter: 12/04/2019  [facility] Cardiologist: Dr. Lovena Le  Subjective   Feeling better daily, no rest SOB, breathing easy this AM, no CP.  R groin is sore, though not tender, and much better then yesterday/last night  Inpatient Medications    Scheduled Meds: . aspirin EC  81 mg Oral Daily  . chlorhexidine gluconate (MEDLINE KIT)  15 mL Mouth Rinse BID  . Chlorhexidine Gluconate Cloth  6 each Topical Daily  . digoxin  0.125 mg Oral Daily  . docusate sodium  100 mg Oral BID  . fentaNYL (SUBLIMAZE) injection  50 mcg Intravenous Once  . gabapentin  600 mg Oral BID  . insulin aspart  0-15 Units Subcutaneous TID WC  . insulin aspart  0-5 Units Subcutaneous QHS  . insulin detemir  7 Units Subcutaneous BID  . losartan  12.5 mg Oral BID  . methylPREDNISolone (SOLU-MEDROL) injection  40 mg Intravenous Q12H  . pantoprazole  40 mg Oral Daily  . polyethylene glycol  17 g Oral Daily  . pravastatin  10 mg Oral q1800  . sodium chloride flush  10-40 mL Intracatheter Q12H  . sodium chloride flush  3 mL Intravenous Q12H  . spironolactone  25 mg Oral Daily   Continuous Infusions: . sodium chloride 10 mL/hr (12/04/19 0100)   PRN Meds: Place/Maintain arterial line **AND** sodium chloride, acetaminophen, ipratropium-albuterol, morphine injection, sodium chloride flush   Vital Signs    Vitals:   12/04/19 0600 12/04/19 0700 12/04/19 0752 12/04/19 0800  BP:      Pulse: 49 49 50 49  Resp: 22 (!) 9 (!) 11 16  Temp: (!) 97 F (36.1 C) (!) 97.2 F (36.2 C) (!) 97 F (36.1 C) (!) 97.2 F (36.2 C)  TempSrc:      SpO2: 97% 97% 96% 97%  Weight:      Height:        Intake/Output Summary (Last 24 hours) at 12/04/2019 0819 Last data filed at 12/04/2019 0600 Gross per 24 hour  Intake 1321.77 ml  Output 4935 ml  Net -3613.23 ml   Last 3 Weights 12/04/2019 12/03/2019 12/02/2019  Weight (lbs) 171 lb 4.8 oz 180 lb 1.9 oz 186 lb 11.7 oz    Weight (kg) 77.7 kg 81.7 kg 84.7 kg      Telemetry    AV pacing 100bpm, occ PVCs, rare couplet, NSVT 3-4 beats - Personally Reviewed  ECG    No new EKGs - Personally Reviewed  Physical Exam   GEN: No acute distress.   Neck: No JVD Cardiac: RRR, no murmurs, rubs, or gallops.  Respiratory: CTA b/l (ant auscultation only). GI: Soft, nontender, non-distended  MS: No edema; No deformity. Neuro:  Nonfocal  Psych: Normal affect   R groin: IABP in place, + ecchymosis, is soft, nontender  Labs    High Sensitivity Troponin:   Recent Labs  Lab 11/26/19 2012 11/26/19 2231 11/27/19 0402  TROPONINIHS 32* 52* 67*      Chemistry Recent Labs  Lab 11/28/19 0215 11/28/19 0215 11/28/19 0535 11/28/19 1500 12/02/19 1709 12/03/19 0340 12/04/19 0330  NA 137   < > 138   < > 136 138 134*  K 4.6   < > 4.6   < > 4.9 4.4 4.3  CL 96*   < > 95*   < > 93* 94* 91*  CO2 24   < > 26   < > 34* 34* 34*  GLUCOSE 128*   < > 139*   < > 234* 227* 228*  BUN 26*   < > 28*   < > 19 20 30*  CREATININE 2.26*   < > 2.16*   < > 1.00 1.02* 1.47*  CALCIUM 9.1   < > 9.3   < > 9.1 9.0 8.8*  PROT 6.5  --  6.6  --   --   --   --   ALBUMIN 3.2*  --  3.3*  --   --   --   --   AST 55*  --  103*  --   --   --   --   ALT 26  --  50*  --   --   --   --   ALKPHOS 83  --  85  --   --   --   --   BILITOT 2.8*  --  3.4*  --   --   --   --   GFRNONAA 24*   < > 25*   < > >60 >60 40*  GFRAA 27*   < > 29*   < > >60 >60 46*  ANIONGAP 17*   < > 17*   < > 9 10 9    < > = values in this interval not displayed.     Hematology Recent Labs  Lab 12/03/19 2039 12/03/19 2213 12/04/19 0330  WBC 14.9* 16.1* 12.4*  RBC 5.28* 5.22* 4.97  HGB 14.1 14.0 13.5  HCT 44.5 43.8 42.8  MCV 84.3 83.9 86.1  MCH 26.7 26.8 27.2  MCHC 31.7 32.0 31.5  RDW 14.7 14.7 15.2  PLT 259 279 238    BNPNo results for input(s): BNP, PROBNP in the last 168 hours.   DDimer No results for input(s): DDIMER in the last 168 hours.   Radiology     CT ABDOMEN PELVIS WO CONTRAST Result Date: 12/04/2019 CLINICAL DATA:  Retroperitoneal hematoma.  Right abdominal pain EXAM: CT ABDOMEN AND PELVIS WITHOUT CONTRAST TECHNIQUE: Multidetector CT imaging of the abdomen and pelvis was performed following the standard protocol without IV contrast. COMPARISON:  Chest x-ray from same day.  12/23/2018 FINDINGS: Lower chest: There are trace bilateral pleural effusions. There is atelectasis at the lung bases.The heart size is normal. Hepatobiliary: The liver is normal. Normal gallbladder.There is no biliary ductal dilation. Pancreas: Normal contours without ductal dilatation. No peripancreatic fluid collection. Spleen: There are multiple calcifications throughout the spleen. Adrenals/Urinary Tract: --Adrenal glands: Unremarkable. --Right kidney/ureter: No hydronephrosis or radiopaque kidney stones. --Left kidney/ureter: No hydronephrosis or radiopaque kidney stones. --Urinary bladder: The urinary bladder is partially decompressed by Foley catheter. Stomach/Bowel: --Stomach/Duodenum: No hiatal hernia or other gastric abnormality. Normal duodenal course and caliber. --Small bowel: Unremarkable. --Colon: There is scattered colonic diverticula without CT evidence for diverticulitis. --Appendix: Surgically absent. Vascular/Lymphatic: There is an intra-aortic balloon pump in place. There is a femoral approach Swan-Ganz catheter. There is a moderate-sized right groin hematoma. No evidence for retroperitoneal hematoma. There is a left femoral approach arterial line that extends down into the left superficial femoral artery. --No retroperitoneal lymphadenopathy. --No mesenteric lymphadenopathy. --No pelvic or inguinal lymphadenopathy. Reproductive: Unremarkable Other: No ascites or free air. The abdominal wall is normal. Musculoskeletal. No acute displaced fractures. IMPRESSION: 1. Moderate-sized right groin hematoma without evidence for retroperitoneal hematoma. 2. Trace  bilateral pleural effusions with atelectasis at the lung bases. 3. Scattered colonic diverticula without CT evidence for diverticulitis. Aortic Atherosclerosis (ICD10-I70.0). Electronically Signed  By: Constance Holster M.D.   On: 12/04/2019 00:22    DG CHEST PORT 1 VIEW Result Date: 12/03/2019 CLINICAL DATA:  Intra-aortic balloon pump. EXAM: PORTABLE CHEST 1 VIEW COMPARISON:  12/02/2019 FINDINGS: Endotracheal tube unchanged. Findings suggesting enteric tube coiled over the stomach with proximal port projecting over the body of the stomach and tip not visualized. Right-sided PICC line unchanged. Dual lead left-sided pacemaker unchanged. Intra-aortic balloon pump with tip over the descending thoracic aorta at the T7-8 level. Lungs are hypoinflated without focal airspace consolidation or effusion. Mild stable cardiomegaly. Remainder of the exam is unchanged. IMPRESSION: 1.  Hypoinflation without acute cardiopulmonary disease. 2.  Multiple tubes and lines as described. Electronically Signed   By: Marin Olp M.D.   On: 12/03/2019 08:09      Cardiac Studies    12/01/2019 R/LHC 1. Elevated right and left heart filling pressures.  2. Low cardiac output on norepinephrine 3.  3. Mixed pulmonary venous/pulmonary arterial hypertension.  4. No significant coronary disease.  5. Successful IABP placement.      Echo 11/27/19 1. Left ventricular ejection fraction, by estimation, is 20 to 25%. The  left ventricle has severely decreased function. The left ventricle  demonstrates regional wall motion abnormalities (see scoring  diagram/findings for description). The left  ventricular internal cavity size was mildly dilated. Left ventricular  diastolic parameters are indeterminate.   2. Right ventricular systolic function is normal. The right ventricular  size is mildly enlarged. There is moderately elevated pulmonary artery  systolic pressure. The estimated right ventricular systolic pressure is  32.6  mmHg.   3. Left atrial size was mildly dilated.   4. Right atrial size was moderately dilated.   5. The mitral valve is normal in structure. Moderate mitral valve  regurgitation.   6. Tricuspid valve regurgitation is moderate to severe.   7. The aortic valve is grossly normal. Aortic valve regurgitation is  mild.   8. The inferior vena cava is normal in size with <50% respiratory  variability, suggesting right atrial pressure of 8 mmHg.   Comparison(s): Changes from prior study are noted.   Conclusion(s)/Recommendation(s): EF severely reduced compared to prior  studies. Wall motion abnormalities, most prominent in anterior, septal,  and lateral walls. Moderate to severe TR with elevated RVSP. Moderate MR.  LV thrombus excluded by definity  contrast. Brief VT during the exam.   Patient Profile     54 y.o. female w/PMHx of HTN, DM, HLD, CHB w/PPM, AFib  COVID infection April 2021 (not hospitalized)  Admitted to [facility] 11/27/2019 with progressive weakness, SOB found to be in acute CHF, LVEF noted marked reduction down to 20-25% w/ regional wall motion abnormalties,  most prominent in anterior, septal, and lateral walls. Moderate to severe TR with elevated RVSP. Moderate MR. LV thrombus excluded by definity contrast. RV systolic function normal.  Developed WCT > VT and NSVT started on amiodarone EP consulted, device function intact, VT was being undercounted by her device, with VT monitor rate set to > 170. We lowered to 150  LHC/RHC 7/23 with no significant CAD, elevated filling pressures, and low cardiac output on norepinephrine. IABP placed  Developed recurrent VT/torsades x 3 on 7/24. Had brief CPR and shocked. QT > 640m. Subsequently intubated  Pt has required pressors >> off all gtts yesterday Extubated yesterday  R groin hematoma last night, better this AM  Assessment & Plan    1. PMVT     Had QT prolongation with amiodarone > lidocaine >  off yesterday     Pacing rate  increased to 100bpm     She has had some pVCs, couplets, NSVT 3-4 beats     Lytes look ok  Mechanism of her VT unclear (Dr. Caryl Comes not entirely convinced that the mechanism of VT was TdP) ? RV pacing ?post COVID myocarditis ? Sarcoid  ? Giant cell (time line felt unlikely)  C.MRI when able  (device system is MRI conditional)  QT better by EKG yesterday  Dr. Lovena Le has seen and examined the patient this morning Off AADs, gtts Keep pacing rate at 100 for now tentatively planned for PPM > CRT-D Thursday (on schedule), he has discussed this with the patient today.  Continue with CCM, AHF teams  For questions or updates, please contact [facility] Please consult [email or web address] for contact info under     Signed, Baldwin [place], PA-C  12/04/2019, 8:19 AM    EP Attending  Patient seen and examined. Agree with the findings as noted above. The patient has stabilized over the weekend. Her VT has been quiet, off of AA drugs. I suspect that the Covid is responsible for her new LV dysfunction. We will plan MRI and ICD insertion with placement of an LV lead. Hopefully her SCV will be patent.   Mikle Bosworth.D.

## 2019-12-04 NOTE — Progress Notes (Signed)
CTSP due to complaints of right groin pain above the site of the IABP with new groin hematoma.  She has been hemodynamically stable and H/H is normal.  This was not noted earlier in the evening.  The patient was rubbing her stomach and noticed a large painful mass in the right groin and notified the nurse.  On exam there is an 8cm x 3cm hard and very tender mass in the right groin just above the insertion of the IABP with ecchymosis surrounding the area.  +2 DP pulse on right.  Area is very tender to palpation and is c/w hematoma.  She also is complaining of mid to low back pain.  Repeat H/H stable at 14.  Pressure held on groin for 40 minutes and then femstop placed.  Patient taken for noncontrast abd/pelvic CT which showed moderate right groin hematoma but no retroperitoneal bleed.  Remains hemodynamically stable.  Groin hematoma appears smaller and less tender.  Will keep fem-stop on for additional hour and then remove.  Plan to remove IABP in am.  Discussed with Dr. Haroldine Laws.

## 2019-12-04 NOTE — Progress Notes (Signed)
With Dr Radford Pax at bedside an 8 cm x 3 cm hematoma noted in Rt lower abdomen above IABP site. Verbal order to stop heparin drip, hold pressure to hematoma for 45 minutes and apply Fem-sop apparatus. All the above was carried out and Pt transported to CT for a scan of abdomen. Upon return with Dr Ramond Craver at bedside it was determined that no retro periteal hematoma was noted on scan. Verbal order received to deflate Fem stop after 1 addition hour. Will continue to monitor and assess.

## 2019-12-04 NOTE — Progress Notes (Signed)
Nutrition Follow Up  DOCUMENTATION CODES:   Obesity unspecified  INTERVENTION:    Ensure Enlive po BID, each supplement provides 350 kcal and 20 grams of protein  MVI daily   NUTRITION DIAGNOSIS:   Increased nutrient needs related to acute illness as evidenced by estimated needs.  Ongoing  GOAL:   Patient will meet greater than or equal to 90% of their needs  Progressing  MONITOR:   Vent status, I & O's, Labs, TF tolerance, Weight trends  REASON FOR ASSESSMENT:   Consult Enteral/tube feeding initiation and management  ASSESSMENT:  RD working remotely.  54 year old female admitted for acute decompensated heart failure after presenting with on and off worsening shortness of breath over the past couple of weeks, bilateral lower extremity swelling, decreased appetite due to early satiety, and 12 lb wt gain in the last week. Past medical history significant for NSTEMI, complete heart block s/p pacemaker, DM2, HTN, and HLD.  7/24 -intubated 7/25- extubated  IABP removed. Off pressors. Lethargic and unable to provide history. Per RN, pt consumed 100% of breakfast this am. No other meal documentation. RD to provide supplementation to maximize kcal and protein this admission. No BM x 4 days. Current has regimen in place.   Admission weight: 90.1 kg  Current weight: 77.7 kg   Medications: colace, SS novolog, levemir, miralax Labs: Na 134 (L) CBG 115-243  Diet Order:   Diet Order            Diet heart healthy/carb modified Room service appropriate? Yes; Fluid consistency: Thin  Diet effective now                 EDUCATION NEEDS:   No education needs have been identified at this time  Skin:  Skin Assessment: Reviewed RN Assessment  Last BM:  7/22  Height:   Ht Readings from Last 1 Encounters:  11/26/19 5' (1.524 m)    Weight:   Wt Readings from Last 1 Encounters:  12/04/19 77.7 kg    Ideal Body Weight:  45.5 kg  BMI:  Body mass index is 33.45  kg/m.  Estimated Nutritional Needs:   Kcal:  1800-2000 kcal  Protein:  90-105 grams  Fluid:  >/= 1.8 L/day   Mariana Single RD, LDN Clinical Nutrition Pager listed in [place]

## 2019-12-04 NOTE — Progress Notes (Signed)
Inpatient Diabetes Program Recommendations  AACE/ADA: New Consensus Statement on Inpatient Glycemic Control (2015)  Target Ranges:  Prepandial:   less than 140 mg/dL      Peak postprandial:   less than 180 mg/dL (1-2 hours)      Critically ill patients:  140 - 180 mg/dL   Lab Results  Component Value Date   GLUCAP 243 (H) 12/04/2019   HGBA1C 7.2 (H) 11/28/2019    Review of Glycemic Control Results for Lisa Poole, Lisa Poole (MRN [id number]) as of 12/04/2019 14:17  Ref. Range 12/04/2019 00:30 12/04/2019 03:51 12/04/2019 08:14 12/04/2019 11:14  Glucose-Capillary Latest Ref Range: 70 - 99 mg/dL 155 (H) 201 (H) 222 (H) 243 (H)   Diabetes history: DM 2 Outpatient Diabetes medications:  Metformin 500 mg bid Current orders for Inpatient glycemic control:  Novolog moderate tid with meals and HS Levemir 7 units bid Solumedrol 40 mg IV daily Inpatient Diabetes Program Recommendations:     Consider increasing Levemir to 10 units bid.   Thanks,  Adah Perl, RN, BC-ADM Inpatient Diabetes Coordinator Pager [phone] (8a-5p)

## 2019-12-04 NOTE — Progress Notes (Addendum)
Patient ID: Lisa Poole, female   DOB: [birth year], 54 y.o.   MRN: [id number]     Advanced Heart Failure Rounding Note  PCP-Cardiologist: No primary care provider on file.   Subjective:    Remains on IABP 1:2. Co-ox 81%.   Developed recurrent VT/torsades x 3 on 7/24. Had brief CPR and shocked. QT > 629m. Subsequently intubated. A-paced at 100. Amio switched to lidocaine. Mag supped. Solumedrol started for presumed cardiac sarcoid.  Extubated 7/25. No further VT on tele. EP stopped lidocaine.   Overnight, pt developed right groin hematoma above IABP insertion site. Noncontrast abd/pelvic CT showed moderate right groin hematoma but no retroperitoneal bleed. Femstop placed. H/H and BP both stable.   She feels much better today. Right groin pain improving. Less tender.   Volume status ok, weight down 27 lbs. CVP 2-3. Bump in SCr 1.02>>1.47.   Swan numbers: CVP 2-3 PA 23/15 CI 2.7  Objective:   Weight Range: 77.7 kg Body mass index is 33.45 kg/m.   Vital Signs:   Temp:  [97 F (36.1 C)-99 F (37.2 C)] 97.2 F (36.2 C) (07/26 0700) Pulse Rate:  [32-100] 49 (07/26 0700) Resp:  [9-25] 9 (07/26 0700) SpO2:  [77 %-100 %] 97 % (07/26 0700) Arterial Line BP: (109-165)/(63-84) 109/63 (07/25 2300) FiO2 (%):  [40 %] 40 % (07/25 0830) Weight:  [77.7 kg] 77.7 kg (07/26 0356) Last BM Date: 11/30/19  Weight change: Filed Weights   12/02/19 0600 12/03/19 0600 12/04/19 0356  Weight: 84.7 kg 81.7 kg 77.7 kg    Intake/Output:   Intake/Output Summary (Last 24 hours) at 12/04/2019 0737 Last data filed at 12/04/2019 0600 Gross per 24 hour  Intake 1444.18 ml  Output 5435 ml  Net -3990.82 ml      Physical Exam    CVP 2-3 General:  Well appearing, laying in bed. No resp difficulty HEENT: normal Neck: supple. no JVD. Carotids 2+ bilat; no bruits. No lymphadenopathy or thryomegaly appreciated. Cor: PMI nondisplaced. Regular rhythm, mildly tachy rate  Lungs: CTAB. No wheezing    Abdomen: soft, nontender, nondistended. No hepatosplenomegaly. No bruits or masses. Good bowel sounds. Extremities: no cyanosis, clubbing, rash, edema RFA IABP  RFV swan, right groin w/ hematoma, mildly tender to touch Neuro: alert & orientedx3, cranial nerves grossly intact. moves all 4 extremities w/o difficulty. Affect pleasant   Telemetry   A-paced 100 Personally reviewed    Labs    CBC Recent Labs    12/03/19 0340 12/03/19 2039 12/03/19 2213 12/04/19 0330  WBC 11.1*   < > 16.1* 12.4*  NEUTROABS 10.0*  --   --  10.3*  HGB 14.2   < > 14.0 13.5  HCT 45.2   < > 43.8 42.8  MCV 86.4   < > 83.9 86.1  PLT 242   < > 279 238   < > = values in this interval not displayed.   Basic Metabolic Panel Recent Labs    12/02/19 1709 12/02/19 1709 12/03/19 0340 12/04/19 0330  NA 136   < > 138 134*  K 4.9   < > 4.4 4.3  CL 93*   < > 94* 91*  CO2 34*   < > 34* 34*  GLUCOSE 234*   < > 227* 228*  BUN 19   < > 20 30*  CREATININE 1.00   < > 1.02* 1.47*  CALCIUM 9.1   < > 9.0 8.8*  MG 2.9*   < > 2.4 2.0  PHOS  2.5  --  2.5  --    < > = values in this interval not displayed.   Liver Function Tests No results for input(s): AST, ALT, ALKPHOS, BILITOT, PROT, ALBUMIN in the last 72 hours. No results for input(s): LIPASE, AMYLASE in the last 72 hours. Cardiac Enzymes No results for input(s): CKTOTAL, CKMB, CKMBINDEX, TROPONINI in the last 72 hours.  BNP: BNP (last 3 results) Recent Labs    11/26/19 2011  BNP 1,258.1*    ProBNP (last 3 results) No results for input(s): PROBNP in the last 8760 hours.   D-Dimer No results for input(s): DDIMER in the last 72 hours. Hemoglobin A1C No results for input(s): HGBA1C in the last 72 hours. Fasting Lipid Panel No results for input(s): CHOL, HDL, LDLCALC, TRIG, CHOLHDL, LDLDIRECT in the last 72 hours. Thyroid Function Tests No results for input(s): TSH, T4TOTAL, T3FREE, THYROIDAB in the last 72 hours.  Invalid input(s):  FREET3  Other results:   Imaging    CT ABDOMEN PELVIS WO CONTRAST  Result Date: 12/04/2019 CLINICAL DATA:  Retroperitoneal hematoma.  Right abdominal pain EXAM: CT ABDOMEN AND PELVIS WITHOUT CONTRAST TECHNIQUE: Multidetector CT imaging of the abdomen and pelvis was performed following the standard protocol without IV contrast. COMPARISON:  Chest x-ray from same day.  12/23/2018 FINDINGS: Lower chest: There are trace bilateral pleural effusions. There is atelectasis at the lung bases.The heart size is normal. Hepatobiliary: The liver is normal. Normal gallbladder.There is no biliary ductal dilation. Pancreas: Normal contours without ductal dilatation. No peripancreatic fluid collection. Spleen: There are multiple calcifications throughout the spleen. Adrenals/Urinary Tract: --Adrenal glands: Unremarkable. --Right kidney/ureter: No hydronephrosis or radiopaque kidney stones. --Left kidney/ureter: No hydronephrosis or radiopaque kidney stones. --Urinary bladder: The urinary bladder is partially decompressed by Foley catheter. Stomach/Bowel: --Stomach/Duodenum: No hiatal hernia or other gastric abnormality. Normal duodenal course and caliber. --Small bowel: Unremarkable. --Colon: There is scattered colonic diverticula without CT evidence for diverticulitis. --Appendix: Surgically absent. Vascular/Lymphatic: There is an intra-aortic balloon pump in place. There is a femoral approach Swan-Ganz catheter. There is a moderate-sized right groin hematoma. No evidence for retroperitoneal hematoma. There is a left femoral approach arterial line that extends down into the left superficial femoral artery. --No retroperitoneal lymphadenopathy. --No mesenteric lymphadenopathy. --No pelvic or inguinal lymphadenopathy. Reproductive: Unremarkable Other: No ascites or free air. The abdominal wall is normal. Musculoskeletal. No acute displaced fractures. IMPRESSION: 1. Moderate-sized right groin hematoma without evidence for  retroperitoneal hematoma. 2. Trace bilateral pleural effusions with atelectasis at the lung bases. 3. Scattered colonic diverticula without CT evidence for diverticulitis. Aortic Atherosclerosis (ICD10-I70.0). Electronically Signed   By: Constance Holster M.D.   On: 12/04/2019 00:22     Medications:     Scheduled Medications: . aspirin EC  81 mg Oral Daily  . chlorhexidine gluconate (MEDLINE KIT)  15 mL Mouth Rinse BID  . Chlorhexidine Gluconate Cloth  6 each Topical Daily  . digoxin  0.125 mg Oral Daily  . docusate sodium  100 mg Oral BID  . fentaNYL (SUBLIMAZE) injection  50 mcg Intravenous Once  . furosemide  80 mg Intravenous BID  . gabapentin  600 mg Oral BID  . insulin aspart  0-15 Units Subcutaneous Q4H  . insulin detemir  7 Units Subcutaneous BID  . losartan  12.5 mg Oral BID  . methylPREDNISolone (SOLU-MEDROL) injection  40 mg Intravenous Q12H  . pantoprazole  40 mg Oral Daily  . polyethylene glycol  17 g Oral Daily  . pravastatin  10 mg Oral q1800  . sodium chloride flush  10-40 mL Intracatheter Q12H  . sodium chloride flush  3 mL Intravenous Q12H  . spironolactone  25 mg Oral Daily    Infusions: . sodium chloride 10 mL/hr (12/04/19 0100)    PRN Medications: Place/Maintain arterial line **AND** sodium chloride, acetaminophen, ipratropium-albuterol, morphine injection, sodium chloride flush    Assessment/Plan   1. Acute systolic CHF: Nonischemic cardiomyopathy.  Echo this admission with EF newly decreased to 20-25% with septal-lateral dyssynchrony and septal severe hypokinesis, normal RV, moderate TR, moderate MR. Echo in 5/20 prior to PPM placement showed EF 60-65%.  Possible causes of cardiomyopathy include chronic RV pacing, frequent NSVT/PVCs, ?CAD (has RFs), ?viral myocarditis (relatively recent COVID-19 infection).  No ETOH/drugs.  She has had increased dyspnea/palpitations x several weeks. Volume overloaded and CXR suggested pulmonary edema at time of admit.  Developed CGS overnight 7/20 w/ hypotension and narrow pulse pressure, w/ subsequent AKI, SCr 1.18>>1.85>>2.26. Lactic acid increased from 2.1>>5.0. Diuresis subsequently limited. She was initially on dopamine, then norepinephrine, milrinone, and back to norepinephrine.  Least ventricular ectopy on norepinephrine.  LHC/RHC 7/23 with no significant CAD, elevated filling pressures, and low cardiac output on norepinephrine. IABP placed.Currently off NE and milrinone.  - IABP at 1:2. Co-ox 81%.  - Volume status much improved w/ diuresis. CVP low 2-3 w/ bump in SCr from 1.0>>1.4.  - Hold diuretics today  - Continue digoxin 0.125 daily.  - Continue spironolactone 25 mg daily  - Continue losartan 12.5 mg bid. BP too soft for Entresto currently  - Will need cardiac MRI (device is compatible) when  IABP out => look for infiltrative disease like sarcoid (we do not know why she had CHB) as well as evidence for prior myocarditis. Giant cell also a concern but timeline not consistent with this   - She has left bundle pacing as an alternative to CRT => QRS in 130s.  She will need ICD upgrade, so will review with EP whether she should be upgraded as well to traditional CRT.  - Solumedrol started 7/24 for presumed sarcoid. Hopefully we can remove IABP today and get cMRI  - Cardiomyopathy may be related to frequent VT/PVCs.  2. Wide complex tachycardia: VT with multiple morphologies.  Increased ventricular arrhythmias over 6 months.  ?Underlying cause.  No significant coronary disease.  Consider infiltrative disease like sarcoidosis.  Increased VT runs with milrinone and dopamine, off both, did better with norepinephrine.  Now with IABP.   Torsades/VT x 3 on 7/24 with prolonged QT required DC-CV. Amio switchted to lido. Lido stopped today by Dr. Caryl Comes.  - Continue atrial pacing at 100 - Keep K > 4.0 Mg > 2.0 - EP following. - Check ESR - Solumedrol started 7/24 for presumed sarcoid. Hopefully we can remove IABP today  and get cMRI  - Will eventually need cardiac PET as well as ICD upgrade.  3. H/o CHB: Cause is uncertain.  Cath in 5/20 prior to PPM showed no significant disease.  - Eventual cardiac MRI to look for infiltrative disease such as sarcoidosis.  4. AKI: In setting of hypotension + diuresis. CVP low today 2-3 - hold diuretics today and follow BMP  5. Right Groin Hematoma: Developed 7/26 at IABP site. Femstop placed.  Noncontrast abd/pelvic CT showed moderate right groin hematoma but no retroperitoneal bleed. H/H stable. Less pain this am. Mildly tender. Continue to monitor. Hopefully IABP can be removed today.   Length of Stay: 8   Brittainy  Ladoris Gene 12/04/2019 7:37 AM  Patient seen with PA, agree with the above note .  She is doing well this morning, hematoma controlled. Hgb only down to 13.5.  Good cardiac output by Luiz Blare, now on IABP 1:2 and off heparin gtt. No further VT, currently A-V sequentially paced around 100 bpm.   General: NAD Neck: No JVD, no thyromegaly or thyroid nodule.  Lungs: Clear to auscultation bilaterally with normal respiratory effort. CV: Nondisplaced PMI.  Heart regular S1/S2, no S3/S4, no murmur.  No peripheral edema.   Abdomen: Soft, nontender, no hepatosplenomegaly, no distention.  Skin: Intact without lesions or rashes.  Neurologic: Alert and oriented x 3.  Psych: Normal affect. Extremities: No clubbing or cyanosis. Right groin hematoma smaller.  HEENT: Normal.   Will wean IABP to 1:3, if cardiac output remains stable, will pull later today.  Heparin already off.   Continue current digoxin, spironolactone, and losartan.  She is now euvolemic and weight down about 28 lbs, stop Lasix.   No further VT, off antiarrhythmics and a-paced at 100 bpm without ventricular ectopy.  Will continue current a-pacing, plan for ICD upgrade later this week.  EP following.   Possible cardiac sarcoidosis.  Currently on Solumedrol, will order cardiac MRI today after IABP  out.  Ultimately, may need cardiac PET at another center.   CRITICAL CARE Performed by: Loralie Champagne  Total critical care time: 40 minutes  Critical care time was exclusive of separately billable procedures and treating other patients.  Critical care was necessary to treat or prevent imminent or life-threatening deterioration.  Critical care was time spent personally by me on the following activities: development of treatment plan with patient and/or surrogate as well as nursing, discussions with consultants, evaluation of patient's response to treatment, examination of patient, obtaining history from patient or surrogate, ordering and performing treatments and interventions, ordering and review of laboratory studies, ordering and review of radiographic studies, pulse oximetry and re-evaluation of patient's condition.  Loralie Champagne 12/04/2019 8:21 AM

## 2019-12-04 NOTE — Progress Notes (Addendum)
Per verbal order from Dr Radford Pax the [place] stop gradually deflated paying close attention for increase in hematoma size.  Contact made with patient's daughter Larene Beach per patient's request and advised of the results of CT scan.

## 2019-12-04 NOTE — Progress Notes (Signed)
NAME:  Lisa Poole, MRN:  [id number], DOB:  27-Dec-1965, LOS: 8 ADMISSION DATE:  11/26/2019, CONSULTATION DATE:  11/28/2019 REFERRING MD:  Charissa Bash, MD  CHIEF COMPLAINT: Cardiogenic shock  Brief History   Patient transferred to ICU for cardiogenic shock. Extubated 12/03/19  History of present illness   Ms.Morand presented to emergency department on 7/18 for evaluation of shortness of breath, lower extremity swelling, and not feeling right.  Patient had signs of worsening heart failure including orthopnea, weight gain, elevated BNP, and pulmonary edema on chest x-ray.  She had episodes of V. tach 7/19 and cardiology was consulted and TTE ordered.    Of note , patient has a pmhx of complete heart block s/p medtronic dual chamber pacemaker 02/2019. No specific etiology for causing complete heart block was identified. She also has history of Covid-19 infection 08/2019, not requiring hospitilization.   TTE showed an EF 20 to 25% compared to echo in 2020 which showed EF 60 - 65%.  The echo also showed regional wall motion abnormalities (anterior, septal and lateral).  Moderate to severe TR with elevated RVSP,  and moderate MR. She was being dirueesed and started on amiodarone for NSVT.Overnight patient developed cardiogenic shock with hypotension and narrow pulse pressure with lactic acidosis and AKI. Dopamine was started and BP improved. With recurrent episodes of NSVT patient was transferred to ICU.     Denies any cough, fever, difficulty urinating before admission, rashes, joint pain, or recent changes in bowl movements.  Past Medical History  Past Medical History: No date: Aortic insufficiency 09/13/2006: Asthma 02/11/2019: Complete heart block ([facility]) 12/12/2018: Cystocele without uterine prolapse No date: Diabetes mellitus without complication (HCC) No date: Elevated troponin level No date: Hypercholesteremia No date: Hypertension 12/10/2017: Primary osteoarthritis of left  knee  Significant Hospital Events   7/18 admit 7/19 cardiogenic shock 7/20 transferred to from ED to ICU 7/23 Cardiac catheterization yesterday showed nonobstructive coronary disease.  Low cardiac index at 1.5.  Elevated PCWP at 27. IABP placed 7/24 intubated for recurrent torsade.  Pacing rate increased.  Started on lidocaine.  Amiodarone stopped. 12/03/19 extubated  Consults:  EP cardiology HF cardiology PCCM  Procedures:  7/23: RHC/LHC and coronary angiography 7/23: IABP  7/24-25> ETT 7/24 Aline>  Significant Diagnostic Tests:  TTE: EF severely reduced , 20-25%, compared to 60-65% one year ago  Micro Data:  MRSA PCR: Negative SARS coronavirus 2: Negative Antimicrobials:  N/A  Interim history/subjective:   Overnight, development of moderate R femoral hematoma above IABP insertion site.   Objective   Blood pressure (!) 116/64, pulse 99, temperature (!) 97.3 F (36.3 C), resp. rate 20, height 5' (1.524 m), weight 77.7 kg, last menstrual period 07/25/2015, SpO2 93 %. PAP: (21-48)/(11-32) 34/21 CVP:  [0 mmHg-23 mmHg] 6 mmHg CO:  [4.9 L/min-6.6 L/min] 6.6 L/min CI:  [2.7 L/min/m2-3.8 L/min/m2] 3.8 L/min/m2      Intake/Output Summary (Last 24 hours) at 12/04/2019 0908 Last data filed at 12/04/2019 0600 Gross per 24 hour  Intake 1229.37 ml  Output 4535 ml  Net -3305.63 ml   Filed Weights   12/02/19 0600 12/03/19 0600 12/04/19 0356  Weight: 84.7 kg 81.7 kg 77.7 kg   PAP: (21-48)/(11-32) 34/21 CVP:  [0 mmHg-23 mmHg] 6 mmHg CO:  [4.9 L/min-6.6 L/min] 6.6 L/min CI:  [2.7 L/min/m2-3.8 L/min/m2] 3.8 L/min/m2    Examination: General: Chronically ill appearing middle aged F, supine in bed NAD HEENT: NCAT pink mmm Trachea midline anicteric sclera Cardiovascular:  AV paced.  Muffled heart sounds with audible IABP sounds. Cap refill < 3 seconds   Pulmonary : CTA bilaterally. Symmetrical chest expansion, even unlabored respirations  Abdominal: Soft round ndnt, normoactive   Musculoskeletal: No obvious joint deformity. No cyanosis or clubbing. Small R groin hematoma superior to IABP insertion site Neuro: AAOx4 following commands no focal deficit      Resolved Hospital Problem list   Acute hypoxic respiratory failure requiring intubation  Torsades de pointes Severe Hypokalemia  Assessment & Plan:   Cardiogenic shock secondary to nonischemic cardiomyopathy requiring IABP support P -IABP per advanced heart failure -- likely dc today (7/26) -off NE and milrinone  Nonischemic Cardiomyopathy of unclear etiology.   Given pre-existing heart block, possible cardiac sarcoidosis. P -Continue empiric steroids -Follow up cardiac MRI after IABP dc. May ultimately need cardiac PET -Spironolactone, digoxin, losartan   Recurrent wide complex tachycardia, torsades de pointes -etiology unclear, possibly in setting of medications, electrolytes vs disease process such as sarcoid  P -avoid qtc prolonging medications -continue pacing rate at 100 per EP  -EP plans for ICD upgrade Thursday 7/29 -K goal > 4 Mag goal > 2 -check ionized calcium   AKI -trend renal indices, I/O  Type 2 diabetes -SSI + Basal   Daily Goals Checklist  Pain/Anxiety/Delirium protocol (if indicated): na VAP protocol (if indicated): na Respiratory support goals: Wean O2 as able, IS  Blood pressure target: IABP per HF. Off NE, milrinone  DVT prophylaxis: On IV heparin. Nutrition Status: NPO GI prophylaxis: Start PPI Urinary catheter: Will progress from foley to purewick  Central lines: Left femoral art line, right femoral and Swan-Ganz catheter Glucose control: SSI + basal  Mobility/therapy needs: Bedrest Antibiotic de-escalation: No antibiotics Home medication reconciliation: Reconciled. Daily labs: CBC, BMP Code Status: Full code Family Communication: Patient updated at bedside  Disposition: ICU   Labs   CBC: Recent Labs  Lab 11/30/19 0350 11/30/19 0350 12/01/19 0328  12/01/19 0844 12/02/19 0339 12/02/19 0339 12/02/19 0926 12/02/19 0926 12/02/19 1106 12/03/19 0340 12/03/19 2039 12/03/19 2213 12/04/19 0330  WBC 6.7   < > 9.7  --  6.8   < > 8.7  --   --  11.1* 14.9* 16.1* 12.4*  NEUTROABS 3.2  --  5.4  --  3.6  --   --   --   --  10.0*  --   --  10.3*  HGB 12.5   < > 14.2   < > 13.3   < > 13.9   < > 14.3 14.2 14.1 14.0 13.5  HCT 40.9   < > 45.8   < > 43.1   < > 45.4   < > 42.0 45.2 44.5 43.8 42.8  MCV 88.3   < > 88.2  --  86.9   < > 87.0  --   --  86.4 84.3 83.9 86.1  PLT 204   < > 272  --  215   < > 250  --   --  242 259 279 238   < > = values in this interval not displayed.    Basic Metabolic Panel: Recent Labs  Lab 11/27/19 1927 11/28/19 0215 11/28/19 0535 11/28/19 1500 12/02/19 0339 12/02/19 0339 12/02/19 0926 12/02/19 1106 12/02/19 1709 12/03/19 0340 12/04/19 0330  NA 139   < > 138   < > 139   < > 137 137 136 138 134*  K 4.0   < > 4.6   < > 3.7   < > 3.1*  2.4* 4.9 4.4 4.3  CL 98   < > 95*   < > 93*  --  92*  --  93* 94* 91*  CO2 27   < > 26   < > 34*  --  33*  --  34* 34* 34*  GLUCOSE 134*   < > 139*   < > 109*  --  266*  --  234* 227* 228*  BUN 24*   < > 28*   < > 21*  --  20  --  19 20 30*  CREATININE 1.85*   < > 2.16*   < > 1.17*  --  1.04*  --  1.00 1.02* 1.47*  CALCIUM 8.8*   < > 9.3   < > 8.8*  --  8.8*  --  9.1 9.0 8.8*  MG 2.3  --  1.9   < > 1.8  --  1.7  --  2.9* 2.4 2.0  PHOS 3.9  --  4.4  --   --   --  2.8  --  2.5 2.5  --    < > = values in this interval not displayed.   GFR: Estimated Creatinine Clearance: 39.9 mL/min (A) (by C-G formula based on SCr of 1.47 mg/dL (H)). Recent Labs  Lab 11/28/19 0535 11/28/19 0612 11/28/19 0813 11/28/19 1413 11/28/19 1713 11/29/19 0420 12/03/19 0340 12/03/19 2039 12/03/19 2213 12/04/19 0330  WBC  --    < >  --   --   --    < > 11.1* 14.9* 16.1* 12.4*  LATICACIDVEN 4.0*  --  2.5* 2.1* 2.2*  --   --   --   --   --    < > = values in this interval not displayed.     Liver Function Tests: Recent Labs  Lab 11/28/19 0215 11/28/19 0535  AST 55* 103*  ALT 26 50*  ALKPHOS 83 85  BILITOT 2.8* 3.4*  PROT 6.5 6.6  ALBUMIN 3.2* 3.3*   No results for input(s): LIPASE, AMYLASE in the last 168 hours. No results for input(s): AMMONIA in the last 168 hours.  ABG    Component Value Date/Time   PHART 7.520 (H) 12/02/2019 1106   PCO2ART 51.1 (H) 12/02/2019 1106   PO2ART 482 (H) 12/02/2019 1106   HCO3 41.9 (H) 12/02/2019 1106   TCO2 43 (H) 12/02/2019 1106   O2SAT 81.4 12/04/2019 0350     Coagulation Profile: No results for input(s): INR, PROTIME in the last 168 hours.  Cardiac Enzymes: No results for input(s): CKTOTAL, CKMB, CKMBINDEX, TROPONINI in the last 168 hours.  HbA1C: Hgb A1c MFr Bld  Date/Time Value Ref Range Status  11/28/2019 06:19 PM 7.2 (H) 4.8 - 5.6 % Final    Comment:    (NOTE) Pre diabetes:          5.7%-6.4%  Diabetes:              >6.4%  Glycemic control for   <7.0% adults with diabetes   11/27/2019 04:02 AM 7.1 (H) 4.8 - 5.6 % Final    Comment:    (NOTE) Pre diabetes:          5.7%-6.4%  Diabetes:              >6.4%  Glycemic control for   <7.0% adults with diabetes     CBG: Recent Labs  Lab 12/03/19 1311 12/03/19 1535 12/03/19 1941 12/04/19 0030 12/04/19 0351  GLUCAP 110* 102* 312* 155*  201*     CRITICAL CARE Performed by: Cristal Generous   Total critical care time: 35 minutes Critical care time was exclusive of separately billable procedures and treating other patients. Critical care was necessary to treat or prevent imminent or life-threatening deterioration.  Critical care was time spent personally by me on the following activities: development of treatment plan with patient and/or surrogate as well as nursing, discussions with consultants, evaluation of patient's response to treatment, examination of patient, obtaining history from patient or surrogate, ordering and performing treatments and  interventions, ordering and review of laboratory studies, ordering and review of radiographic studies, pulse oximetry and re-evaluation of patient's condition.  Eliseo Gum MSN, AGACNP-BC [place] [phone] If no answer, [phone] 12/04/2019, 9:08 AM

## 2019-12-04 NOTE — Progress Notes (Signed)
Site area- right  Site Prior to Removal- 2   Pressure Applied For-  20 MInutes   Bedrest Beginning at - 945   Manual- Yes   Patient Status During Pull- Stable    Post Pull Groin Site- 1   Post Pull Instructions Given- Yes   Post Pull Pulses Present- Yes    Dressing Applied- Tegaderm and Gauze Dressing    Comments:  Pt tolerated fair. RN at bedside, V/S normal. Bruising from prior hemotoma

## 2019-12-05 ENCOUNTER — Inpatient Hospital Stay (HOSPITAL_COMMUNITY): Payer: Medicare Other

## 2019-12-05 DIAGNOSIS — I5021 Acute systolic (congestive) heart failure: Secondary | ICD-10-CM

## 2019-12-05 LAB — COOXEMETRY PANEL
Carboxyhemoglobin: 1.5 % (ref 0.5–1.5)
Methemoglobin: 0.6 % (ref 0.0–1.5)
O2 Saturation: 76.7 %
Total hemoglobin: 13.1 g/dL (ref 12.0–16.0)

## 2019-12-05 LAB — BASIC METABOLIC PANEL
Anion gap: 6 (ref 5–15)
BUN: 20 mg/dL (ref 6–20)
CO2: 34 mmol/L — ABNORMAL HIGH (ref 22–32)
Calcium: 9 mg/dL (ref 8.9–10.3)
Chloride: 97 mmol/L — ABNORMAL LOW (ref 98–111)
Creatinine, Ser: 0.83 mg/dL (ref 0.44–1.00)
GFR calc Af Amer: >60 mL/min (ref >60)
GFR calc non Af Amer: >60 mL/min (ref >60)
Glucose, Bld: 102 mg/dL — ABNORMAL HIGH (ref 70–99)
Potassium: 3.7 mmol/L (ref 3.5–5.1)
Sodium: 137 mmol/L (ref 135–145)

## 2019-12-05 LAB — GLUCOSE, CAPILLARY
Glucose-Capillary: 99 mg/dL (ref 70–99)
Glucose-Capillary: 277 mg/dL — ABNORMAL HIGH (ref 70–99)
Glucose-Capillary: 317 mg/dL — ABNORMAL HIGH (ref 70–99)
Glucose-Capillary: 137 mg/dL — ABNORMAL HIGH (ref 70–99)

## 2019-12-05 LAB — CBC WITH DIFFERENTIAL/PLATELET
Abs Immature Granulocytes: 0.05 10*3/uL (ref 0.00–0.07)
Basophils Absolute: 0 10*3/uL (ref 0.0–0.1)
Basophils Relative: 0 %
Eosinophils Absolute: 0.2 10*3/uL (ref 0.0–0.5)
Eosinophils Relative: 1 %
HCT: 42.3 % (ref 36.0–46.0)
Hemoglobin: 13 g/dL (ref 12.0–15.0)
Immature Granulocytes: 0 %
Lymphocytes Relative: 21 %
Lymphs Abs: 2.7 10*3/uL (ref 0.7–4.0)
MCH: 27 pg (ref 26.0–34.0)
MCHC: 30.7 g/dL (ref 30.0–36.0)
MCV: 87.9 fL (ref 80.0–100.0)
Monocytes Absolute: 1.6 10*3/uL — ABNORMAL HIGH (ref 0.1–1.0)
Monocytes Relative: 12 %
Neutro Abs: 8.3 10*3/uL — ABNORMAL HIGH (ref 1.7–7.7)
Neutrophils Relative %: 66 %
Platelets: 270 10*3/uL (ref 150–400)
RBC: 4.81 MIL/uL (ref 3.87–5.11)
RDW: 14.9 % (ref 11.5–15.5)
WBC: 12.9 10*3/uL — ABNORMAL HIGH (ref 4.0–10.5)
nRBC: 0 % (ref 0.0–0.2)

## 2019-12-05 LAB — DIGOXIN LEVEL: Digoxin Level: 0.4 ng/mL — ABNORMAL LOW (ref 0.8–2.0)

## 2019-12-05 LAB — MAGNESIUM: Magnesium: 2 mg/dL (ref 1.7–2.4)

## 2019-12-05 MED ORDER — POTASSIUM CHLORIDE CRYS ER 20 MEQ PO TBCR
40.0000 meq | EXTENDED_RELEASE_TABLET | Freq: Once | ORAL | Status: AC
  Administered 2019-12-05: 40 meq via ORAL
  Filled 2019-12-05: qty 2

## 2019-12-05 MED ORDER — GADOBUTROL 1 MMOL/ML IV SOLN
7.0000 mL | Freq: Once | INTRAVENOUS | Status: AC | PRN
  Administered 2019-12-05: 7 mL via INTRAVENOUS

## 2019-12-05 MED ORDER — SACUBITRIL-VALSARTAN 24-26 MG PO TABS
1.0000 | ORAL_TABLET | Freq: Two times a day (BID) | ORAL | Status: DC
  Administered 2019-12-05 (×2): 1 via ORAL
  Filled 2019-12-05 (×3): qty 1

## 2019-12-05 MED ORDER — ENOXAPARIN SODIUM 40 MG/0.4ML ~~LOC~~ SOLN
40.0000 mg | SUBCUTANEOUS | Status: DC
  Administered 2019-12-05 – 2019-12-06 (×2): 40 mg via SUBCUTANEOUS
  Filled 2019-12-05 (×2): qty 0.4

## 2019-12-05 MED FILL — Verapamil HCl IV Soln 2.5 MG/ML: INTRAVENOUS | Qty: 2 | Status: AC

## 2019-12-05 NOTE — Progress Notes (Addendum)
Progress Note  Patient Name: Lisa Poole Date of Encounter: 12/05/2019  [facility] Cardiologist: Dr. Lovena Le  Subjective   Feeling better daily, no rest SOB, breathing easy this AM, no CP.  R groin is sore, though not tender, and much better then yesterday/last night  Inpatient Medications    Scheduled Meds:  aspirin EC  81 mg Oral Daily   chlorhexidine gluconate (MEDLINE KIT)  15 mL Mouth Rinse BID   Chlorhexidine Gluconate Cloth  6 each Topical Daily   digoxin  0.125 mg Oral Daily   docusate sodium  100 mg Oral BID   enoxaparin (LOVENOX) injection  40 mg Subcutaneous Q24H   feeding supplement (ENSURE ENLIVE)  237 mL Oral BID BM   gabapentin  600 mg Oral BID   insulin aspart  0-15 Units Subcutaneous TID WC   insulin aspart  0-5 Units Subcutaneous QHS   insulin detemir  7 Units Subcutaneous BID   methylPREDNISolone (SOLU-MEDROL) injection  40 mg Intravenous Daily   multivitamin with minerals  1 tablet Oral Daily   pantoprazole  40 mg Oral Daily   polyethylene glycol  17 g Oral Daily   pravastatin  10 mg Oral q1800   sacubitril-valsartan  1 tablet Oral BID   sodium chloride flush  10-40 mL Intracatheter Q12H   sodium chloride flush  3 mL Intravenous Q12H   spironolactone  25 mg Oral Daily   Continuous Infusions:  sodium chloride Stopped (12/04/19 1707)   PRN Meds: Place/Maintain arterial line **AND** sodium chloride, acetaminophen, ipratropium-albuterol, sodium chloride flush   Vital Signs    Vitals:   12/05/19 0500 12/05/19 0600 12/05/19 0645 12/05/19 0700  BP: (!) 145/81 121/70    Pulse: 99 100  104  Resp: _0 Temp:   (!) 97.5 F (36.4 C)   TempSrc:   Oral   SpO2: 95% 95%  (!) 56%  Weight: 77.6 kg     Height:        Intake/Output Summary (Last 24 hours) at 12/05/2019 0834 Last data filed at 12/05/2019 0800 Gross per 24 hour  Intake 240 ml  Output 1025 ml  Net -785 ml   Last 3 Weights 12/05/2019 12/04/2019 12/03/2019  Weight (lbs) 171 lb 1.2  oz 171 lb 4.8 oz 180 lb 1.9 oz  Weight (kg) 77.6 kg 77.7 kg 81.7 kg      Telemetry    AV pacing 100bpm, occ PVCs, rare couplet, NSVT 3-4 beats - Personally Reviewed  ECG    No new EKGs - Personally Reviewed  Physical Exam   GEN: No acute distress.   Neck: No JVD Cardiac: RRR, no murmurs, rubs, or gallops.  Respiratory: CTA b/l (ant auscultation only). GI: Soft, nontender, non-distended  MS: No edema; No deformity. Neuro:  Nonfocal  Psych: Normal affect   R groin: IABP in place, + ecchymosis, is soft, nontender  Labs    High Sensitivity Troponin:   Recent Labs  Lab 11/26/19 2012 11/26/19 2231 11/27/19 0402  TROPONINIHS 32* 52* 67*      Chemistry Recent Labs  Lab 12/03/19 0340 12/04/19 0330 12/05/19 0543  NA 138 134* 137  K 4.4 4.3 3.7  CL 94* 91* 97*  CO2 34* 34* 34*  GLUCOSE 227* 228* 102*  BUN 20 30* 20  CREATININE 1.02* 1.47* 0.83  CALCIUM 9.0 8.8* 9.0  GFRNONAA >60 40* >60  GFRAA >60 46* >60  ANIONGAP _1 Hematology Recent Labs  Lab 12/03/19 2213 12/04/19 0330 12/05/19 0543  WBC 16.1* 12.4* 12.9*  RBC 5.22* 4.97 4.81  HGB 14.0 13.5 13.0  HCT 43.8 42.8 42.3  MCV 83.9 86.1 87.9  MCH 26.8 27.2 27.0  MCHC 32.0 31.5 30.7  RDW 14.7 15.2 14.9  PLT 279 238 270    BNPNo results for input(s): BNP, PROBNP in the last 168 hours.   DDimer No results for input(s): DDIMER in the last 168 hours.   Radiology    CT ABDOMEN PELVIS WO CONTRAST Result Date: 12/04/2019 CLINICAL DATA:  Retroperitoneal hematoma.  Right abdominal pain EXAM: CT ABDOMEN AND PELVIS WITHOUT CONTRAST TECHNIQUE: Multidetector CT imaging of the abdomen and pelvis was performed following the standard protocol without IV contrast. COMPARISON:  Chest x-ray from same day.  12/23/2018 FINDINGS: Lower chest: There are trace bilateral pleural effusions. There is atelectasis at the lung bases.The heart size is normal. Hepatobiliary: The liver is normal. Normal gallbladder.There is no  biliary ductal dilation. Pancreas: Normal contours without ductal dilatation. No peripancreatic fluid collection. Spleen: There are multiple calcifications throughout the spleen. Adrenals/Urinary Tract: --Adrenal glands: Unremarkable. --Right kidney/ureter: No hydronephrosis or radiopaque kidney stones. --Left kidney/ureter: No hydronephrosis or radiopaque kidney stones. --Urinary bladder: The urinary bladder is partially decompressed by Foley catheter. Stomach/Bowel: --Stomach/Duodenum: No hiatal hernia or other gastric abnormality. Normal duodenal course and caliber. --Small bowel: Unremarkable. --Colon: There is scattered colonic diverticula without CT evidence for diverticulitis. --Appendix: Surgically absent. Vascular/Lymphatic: There is an intra-aortic balloon pump in place. There is a femoral approach Swan-Ganz catheter. There is a moderate-sized right groin hematoma. No evidence for retroperitoneal hematoma. There is a left femoral approach arterial line that extends down into the left superficial femoral artery. --No retroperitoneal lymphadenopathy. --No mesenteric lymphadenopathy. --No pelvic or inguinal lymphadenopathy. Reproductive: Unremarkable Other: No ascites or free air. The abdominal wall is normal. Musculoskeletal. No acute displaced fractures. IMPRESSION: 1. Moderate-sized right groin hematoma without evidence for retroperitoneal hematoma. 2. Trace bilateral pleural effusions with atelectasis at the lung bases. 3. Scattered colonic diverticula without CT evidence for diverticulitis. Aortic Atherosclerosis (ICD10-I70.0). Electronically Signed   By: Constance Holster M.D.   On: 12/04/2019 00:22    DG CHEST PORT 1 VIEW Result Date: 12/03/2019 CLINICAL DATA:  Intra-aortic balloon pump. EXAM: PORTABLE CHEST 1 VIEW COMPARISON:  12/02/2019 FINDINGS: Endotracheal tube unchanged. Findings suggesting enteric tube coiled over the stomach with proximal port projecting over the body of the stomach and  tip not visualized. Right-sided PICC line unchanged. Dual lead left-sided pacemaker unchanged. Intra-aortic balloon pump with tip over the descending thoracic aorta at the T7-8 level. Lungs are hypoinflated without focal airspace consolidation or effusion. Mild stable cardiomegaly. Remainder of the exam is unchanged. IMPRESSION: 1.  Hypoinflation without acute cardiopulmonary disease. 2.  Multiple tubes and lines as described. Electronically Signed   By: Marin Olp M.D.   On: 12/03/2019 08:09      Cardiac Studies    12/01/2019 R/LHC 1. Elevated right and left heart filling pressures.  2. Low cardiac output on norepinephrine 3.  3. Mixed pulmonary venous/pulmonary arterial hypertension.  4. No significant coronary disease.  5. Successful IABP placement.      Echo 11/27/19 1. Left ventricular ejection fraction, by estimation, is 20 to 25%. The  left ventricle has severely decreased function. The left ventricle  demonstrates regional wall motion abnormalities (see scoring  diagram/findings for description). The left  ventricular internal cavity size was mildly dilated. Left ventricular  diastolic parameters are indeterminate.  2. Right ventricular systolic function is normal. The right ventricular  size is mildly enlarged. There is moderately elevated pulmonary artery  systolic pressure. The estimated right ventricular systolic pressure is  34.1 mmHg.   3. Left atrial size was mildly dilated.   4. Right atrial size was moderately dilated.   5. The mitral valve is normal in structure. Moderate mitral valve  regurgitation.   6. Tricuspid valve regurgitation is moderate to severe.   7. The aortic valve is grossly normal. Aortic valve regurgitation is  mild.   8. The inferior vena cava is normal in size with <50% respiratory  variability, suggesting right atrial pressure of 8 mmHg.   Comparison(s): Changes from prior study are noted.   Conclusion(s)/Recommendation(s): EF severely  reduced compared to prior  studies. Wall motion abnormalities, most prominent in anterior, septal,  and lateral walls. Moderate to severe TR with elevated RVSP. Moderate MR.  LV thrombus excluded by definity  contrast. Brief VT during the exam.   Patient Profile     54 y.o. female w/PMHx of HTN, DM, HLD, CHB w/PPM, AFib  COVID infection April 2021 (not hospitalized)  Admitted to [facility] 11/27/2019 with progressive weakness, SOB found to be in acute CHF, LVEF noted marked reduction down to 20-25% w/ regional wall motion abnormalties,  most prominent in anterior, septal, and lateral walls. Moderate to severe TR with elevated RVSP. Moderate MR. LV thrombus excluded by definity contrast. RV systolic function normal.  Developed WCT > VT and NSVT started on amiodarone EP consulted, device function intact, VT was being undercounted by her device, with VT monitor rate set to > 170. We lowered to 150  LHC/RHC 7/23 with no significant CAD, elevated filling pressures, and low cardiac output on norepinephrine. IABP placed  Developed recurrent VT/torsades x 3 on 7/24. Had brief CPR and shocked. QT > 669m. Subsequently intubated  Pt has required pressors >> off all gtts yesterday Extubated yesterday  R groin hematoma last night, better this AM  Assessment & Plan    1. PMVT     Had QT prolongation with amiodarone > lidocaine > off 12/03/19     Pacing rate was increased to 100bpm     She has had some pVCs, couplets, NSVT 3-4 beats, once this AM 25 secondsm rate 150's     Keep K+ 4.0 or greater, Mag 2.0 or greater  Mechanism of her VT unclear (Dr. KCaryl Comesnot entirely convinced that the mechanism of VT was TdP) ? RV pacing ?post COVID myocarditis ? Sarcoid  ? Giant cell (time line felt unlikely)  C.MRI for today  QT better by EKG 12/03/19  Dr. TLovena Lehas seen and examined the patient this morning Remains off AADs, gtts Slow VT this AM Add betablocker when able from HF perspective, d/w HF team  this AM No AAD, reduce pacing rate to 90 today  planned for PPM > CRT-D Thursday (on schedule), Dr. TLovena Lerevisited this this morning, the patient remains agreeable  Will need picc removed tomorrow in anticipation of her procedure.   Continue with CCM, AHF teams    For questions or updates, please contact [facility] Please consult [email or web address] for contact info under     Signed, RBaldwin Jamaica PA-C  12/05/2019, 8:34 AM     EP Attending  Patient seen and examined. Agree with the findings as noted above. The patient has been stable though she did have some slower VT. I have recommended she undergo biv upgrade on Thursday. I  have reviewed the indications/risks/benefits and she wishes to proceed.   Mikle Bosworth.D.

## 2019-12-05 NOTE — Progress Notes (Signed)
RN spoke with MRI staff regarding when cardiac MRI could be completed; MRI staff unable to do this morning; stated they will call back when they are able to accommodate.

## 2019-12-05 NOTE — Progress Notes (Signed)
Chaplain engaged in follow-up visit with Lisa Poole.  Seynabou shared the journey that she has been on which included coding three times since chaplain's last visit.  [name] shared that physically she is feeling better but emotionally has been feeling drained. Chaplain offered support and uplifted the journey and progress she has made.  Chaplain also followed up about Advanced Directive. [name] stated that the paperwork got wet and was discarded and she would now like to wait on completing that paperwork. Chaplain offered a blessing over [place] and highlighted her strength.  Chaplain will continue to follow-up.

## 2019-12-05 NOTE — Progress Notes (Signed)
Inpatient Diabetes Program Recommendations  AACE/ADA: New Consensus Statement on Inpatient Glycemic Control (2015)  Target Ranges:  Prepandial:   less than 140 mg/dL      Peak postprandial:   less than 180 mg/dL (1-2 hours)      Critically ill patients:  140 - 180 mg/dL   Lab Results  Component Value Date   GLUCAP 277 (H) 12/05/2019   HGBA1C 7.2 (H) 11/28/2019    Review of Glycemic Control Results for KELLIS, TOPETE (MRN [id number]) as of 12/05/2019 12:42  Ref. Range 12/04/2019 00:30 12/04/2019 03:51 12/04/2019 08:14 12/04/2019 11:14 12/04/2019 15:58 12/04/2019 21:27 12/05/2019 06:47 12/05/2019 11:37  Glucose-Capillary Latest Ref Range: 70 - 99 mg/dL 155 (H) 201 (H) 222 (H) 243 (H) 115 (H) 240 (H) 99 277 (H)   Diabetes history: DM 2 Outpatient Diabetes medications:  Metformin 500 mg bid  Current orders for Inpatient glycemic control:  Novolog moderate tid with meals and HS Levemir 7 units bid Solumedrol 40 mg IV daily Inpatient Diabetes Program Recommendations:   Consider reducing Novolog correction to sensitive tid with meals.  Also consider adding Novolog meal coverage 3 units tid with meals (hold if patient eats less than 50%).   Thanks,  Adah Perl, RN, BC-ADM Inpatient Diabetes Coordinator Pager [phone] (8a-5p)

## 2019-12-05 NOTE — Progress Notes (Addendum)
Patient ID: Lisa Poole, female   DOB: [birth year], 54 y.o.   MRN: [id number]     Advanced Heart Failure Rounding Note  PCP-Cardiologist: No primary care provider on file.   Subjective:    IABP removed yesterday. Co-ox 77%.  Volume status stable. CVP 3-4. SBPs 120s.   No further VT on tele.   AKI resolved.   OOB sitting in chair. No complaints.   Objective:   Weight Range: 77.6 kg Body mass index is 33.41 kg/m.   Vital Signs:   Temp:  [97 F (36.1 C)-98 F (36.7 C)] 97.5 F (36.4 C) (07/27 0645) Pulse Rate:  [49-104] 104 (07/27 0700) Resp:  [11-27] 15 (07/27 0700) BP: (101-149)/(53-100) 121/70 (07/27 0600) SpO2:  [56 %-100 %] 56 % (07/27 0700) Weight:  [77.6 kg] 77.6 kg (07/27 0500) Last BM Date: 12/04/19  Weight change: Filed Weights   12/03/19 0600 12/04/19 0356 12/05/19 0500  Weight: 81.7 kg 77.7 kg 77.6 kg    Intake/Output:   Intake/Output Summary (Last 24 hours) at 12/05/2019 0714 Last data filed at 12/05/2019 0300 Gross per 24 hour  Intake 360 ml  Output 1150 ml  Net -790 ml      Physical Exam    CVP 3-4 General:  Well appearing sitting up in chair. No resp difficulty HEENT: normal Neck: supple. no JVD. Carotids 2+ bilat; no bruits. No lymphadenopathy or thryomegaly appreciated. Cor: PMI nondisplaced. RRR Lungs: clear bilaterally. No wheezing  Abdomen: soft, nontender, nondistended. No hepatosplenomegaly. No bruits or masses. Good bowel sounds. Extremities: no cyanosis, clubbing, rash, edema +right groin w/ hematoma, slightly tender to palpation  Neuro: alert & orientedx3, cranial nerves grossly intact. moves all 4 extremities w/o difficulty. Affect pleasant   Telemetry   A-paced 100. No further VT. Occasional PVCs Personally reviewed    Labs    CBC Recent Labs    12/04/19 0330 12/05/19 0543  WBC 12.4* 12.9*  NEUTROABS 10.3* 8.3*  HGB 13.5 13.0  HCT 42.8 42.3  MCV 86.1 87.9  PLT 238 096   Basic Metabolic Panel Recent Labs     12/02/19 1709 12/02/19 1709 12/03/19 0340 12/03/19 0340 12/04/19 0330 12/05/19 0543  NA 136   < > 138   < > 134* 137  K 4.9   < > 4.4   < > 4.3 3.7  CL 93*   < > 94*   < > 91* 97*  CO2 34*   < > 34*   < > 34* 34*  GLUCOSE 234*   < > 227*   < > 228* 102*  BUN 19   < > 20   < > 30* 20  CREATININE 1.00   < > 1.02*   < > 1.47* 0.83  CALCIUM 9.1   < > 9.0   < > 8.8* 9.0  MG 2.9*   < > 2.4   < > 2.0 2.0  PHOS 2.5  --  2.5  --   --   --    < > = values in this interval not displayed.   Liver Function Tests No results for input(s): AST, ALT, ALKPHOS, BILITOT, PROT, ALBUMIN in the last 72 hours. No results for input(s): LIPASE, AMYLASE in the last 72 hours. Cardiac Enzymes No results for input(s): CKTOTAL, CKMB, CKMBINDEX, TROPONINI in the last 72 hours.  BNP: BNP (last 3 results) Recent Labs    11/26/19 2011  BNP 1,258.1*    ProBNP (last 3 results) No results for input(s):  PROBNP in the last 8760 hours.   D-Dimer No results for input(s): DDIMER in the last 72 hours. Hemoglobin A1C No results for input(s): HGBA1C in the last 72 hours. Fasting Lipid Panel No results for input(s): CHOL, HDL, LDLCALC, TRIG, CHOLHDL, LDLDIRECT in the last 72 hours. Thyroid Function Tests No results for input(s): TSH, T4TOTAL, T3FREE, THYROIDAB in the last 72 hours.  Invalid input(s): FREET3  Other results:   Imaging    No results found.   Medications:     Scheduled Medications: . aspirin EC  81 mg Oral Daily  . chlorhexidine gluconate (MEDLINE KIT)  15 mL Mouth Rinse BID  . Chlorhexidine Gluconate Cloth  6 each Topical Daily  . digoxin  0.125 mg Oral Daily  . docusate sodium  100 mg Oral BID  . feeding supplement (ENSURE ENLIVE)  237 mL Oral BID BM  . gabapentin  600 mg Oral BID  . insulin aspart  0-15 Units Subcutaneous TID WC  . insulin aspart  0-5 Units Subcutaneous QHS  . insulin detemir  7 Units Subcutaneous BID  . losartan  12.5 mg Oral BID  . methylPREDNISolone  (SOLU-MEDROL) injection  40 mg Intravenous Daily  . multivitamin with minerals  1 tablet Oral Daily  . pantoprazole  40 mg Oral Daily  . polyethylene glycol  17 g Oral Daily  . pravastatin  10 mg Oral q1800  . sodium chloride flush  10-40 mL Intracatheter Q12H  . sodium chloride flush  3 mL Intravenous Q12H  . spironolactone  25 mg Oral Daily    Infusions: . sodium chloride Stopped (12/04/19 1707)    PRN Medications: Place/Maintain arterial line **AND** sodium chloride, acetaminophen, ipratropium-albuterol, sodium chloride flush    Assessment/Plan   1. Acute systolic CHF: Nonischemic cardiomyopathy.  Echo this admission with EF newly decreased to 20-25% with septal-lateral dyssynchrony and septal severe hypokinesis, normal RV, moderate TR, moderate MR. Echo in 5/20 prior to PPM placement showed EF 60-65%.  Possible causes of cardiomyopathy include chronic RV pacing, frequent NSVT/PVCs, ?CAD (has RFs), ?viral myocarditis (relatively recent COVID-19 infection).  No ETOH/drugs.  She has had increased dyspnea/palpitations x several weeks. Volume overloaded and CXR suggested pulmonary edema at time of admit. Developed CGS overnight 7/20 w/ hypotension and narrow pulse pressure, w/ subsequent AKI, SCr 1.18>>1.85>>2.26. Lactic acid increased from 2.1>>5.0. Diuresis subsequently limited. She was initially on dopamine, then norepinephrine, milrinone, and back to norepinephrine.  Least ventricular ectopy on norepinephrine.  LHC/RHC 7/23 with no significant CAD, elevated filling pressures, and low cardiac output on norepinephrine. IABP placed. IABP removed 7/26. Off pressors. Co-ox 77%. Volume status stable, CVP 3-4 - Continue to hold diuretics today.  - Continue digoxin 0.125 daily. Dig level ok - Continue spironolactone 25 mg daily  - Stop losartan and switch to Entresto 24-26 mg bid - Plan cardiac MRI today (device is compatible) => look for infiltrative disease like sarcoid (we do not know why she  had CHB) as well as evidence for prior myocarditis. Giant cell also a concern but timeline not consistent with this.  No evidence for pulmonary sarcoidosis per pulmonology.  - She has left bundle pacing as an alternative to CRT => QRS in 130s.  She will need ICD upgrade. EP following, plan later this week  - Solumedrol started 7/24 for presumed sarcoid, decreased to daily.  - Cardiomyopathy may be related to frequent VT/PVCs.  2. Wide complex tachycardia: VT with multiple morphologies.  Increased ventricular arrhythmias over 6 months.  ?Underlying  cause.  No significant coronary disease.  Consider infiltrative disease like sarcoidosis.  Increased VT runs with milrinone and dopamine, off both, did better with norepinephrine. IABP implanted 7/23=>removed 7/26.   Torsades/VT x 3 on 7/24 with prolonged QT required DC-CV. Amio switched to lido. Lido stopped 7/26 by Dr. Caryl Comes.  - Continue atrial pacing at 100 - Keep K > 4.0 Mg > 2.0 - EP following. - Solumedrol started 7/24 for presumed sarcoid.  - Will eventually need cardiac PET as well as ICD upgrade.  3. H/o CHB: Cause is uncertain.  Cath in 5/20 prior to PPM showed no significant disease.  - Eventual cardiac MRI to look for infiltrative disease such as sarcoidosis.  4. AKI: In setting of hypotension + diuresis. Diuretics held.  - SCr improved/ normalized today at 0.83. w/ low CVP, will continue to hold loop diuretics.  - monitor BMP w/ Entresto addition  5. Right Groin Hematoma: Developed 7/26 at IABP site. Femstop placed.  Noncontrast abd/pelvic CT showed moderate right groin hematoma but no retroperitoneal bleed. H/H stable. Less pain this am. Improving.   Length of Stay: [address] [place] 12/05/2019 7:14 AM  Patient seen with PA, agree with the above note.    No complaints this morning.  IABP out.  Co-ox 77%, CVP 3-4.  She has occasional PVCs on telemetry.  She is being atrially paced at rate 100 bpm.   General: NAD Neck: No  JVD, no thyromegaly or thyroid nodule.  Lungs: Clear to auscultation bilaterally with normal respiratory effort. CV: Nondisplaced PMI.  Heart regular S1/S2, no S3/S4, no murmur.  No peripheral edema.   Abdomen: Soft, nontender, no hepatosplenomegaly, no distention.  Skin: Intact without lesions or rashes.  Neurologic: Alert and oriented x 3.  Psych: Normal affect. Extremities: No clubbing or cyanosis.  HEENT: Normal.   Does not need diuretic today, will stop losartan and start Entresto. Continue digoxin and spironolactone.   Possible cardiac sarcoidosis to explain VT, CHB, and low EF.  Will have cardiac MRI today.  Treating empirically with steroids, tapered to daily Solumedrol yesterday.  Will transition to prednisone soon.  No evidence for pulmonary sarcoidosis per pulmonary.   CVP ok, does not need Lasix today.   CRT-D upgrade planned later this week.  In the meantime, will continue a-pacing rate around 100 bpm which seems to be suppressing ectopy at this point.   Mobilize.   CRITICAL CARE Performed by: Loralie Champagne  Total critical care time: 35 minutes  Critical care time was exclusive of separately billable procedures and treating other patients.  Critical care was necessary to treat or prevent imminent or life-threatening deterioration.  Critical care was time spent personally by me on the following activities: development of treatment plan with patient and/or surrogate as well as nursing, discussions with consultants, evaluation of patient's response to treatment, examination of patient, obtaining history from patient or surrogate, ordering and performing treatments and interventions, ordering and review of laboratory studies, ordering and review of radiographic studies, pulse oximetry and re-evaluation of patient's condition.   Loralie Champagne 12/05/2019 7:42 AM

## 2019-12-06 ENCOUNTER — Inpatient Hospital Stay (HOSPITAL_COMMUNITY): Payer: Medicare Other

## 2019-12-06 LAB — CBC WITH DIFFERENTIAL/PLATELET
Abs Immature Granulocytes: 0.08 10*3/uL — ABNORMAL HIGH (ref 0.00–0.07)
Basophils Absolute: 0.1 10*3/uL (ref 0.0–0.1)
Basophils Relative: 0 %
Eosinophils Absolute: 0.3 10*3/uL (ref 0.0–0.5)
Eosinophils Relative: 2 %
HCT: 44.9 % (ref 36.0–46.0)
Hemoglobin: 13.9 g/dL (ref 12.0–15.0)
Immature Granulocytes: 1 %
Lymphocytes Relative: 27 %
Lymphs Abs: 4 10*3/uL (ref 0.7–4.0)
MCH: 26.7 pg (ref 26.0–34.0)
MCHC: 31 g/dL (ref 30.0–36.0)
MCV: 86.3 fL (ref 80.0–100.0)
Monocytes Absolute: 1.6 10*3/uL — ABNORMAL HIGH (ref 0.1–1.0)
Monocytes Relative: 11 %
Neutro Abs: 8.6 10*3/uL — ABNORMAL HIGH (ref 1.7–7.7)
Neutrophils Relative %: 59 %
Platelets: 321 10*3/uL (ref 150–400)
RBC: 5.2 MIL/uL — ABNORMAL HIGH (ref 3.87–5.11)
RDW: 15 % (ref 11.5–15.5)
WBC: 14.6 10*3/uL — ABNORMAL HIGH (ref 4.0–10.5)
nRBC: 0 % (ref 0.0–0.2)

## 2019-12-06 LAB — GLUCOSE, CAPILLARY
Glucose-Capillary: 93 mg/dL (ref 70–99)
Glucose-Capillary: 184 mg/dL — ABNORMAL HIGH (ref 70–99)
Glucose-Capillary: 181 mg/dL — ABNORMAL HIGH (ref 70–99)
Glucose-Capillary: 292 mg/dL — ABNORMAL HIGH (ref 70–99)
Glucose-Capillary: 217 mg/dL — ABNORMAL HIGH (ref 70–99)

## 2019-12-06 LAB — BASIC METABOLIC PANEL
Anion gap: 7 (ref 5–15)
BUN: 14 mg/dL (ref 6–20)
CO2: 30 mmol/L (ref 22–32)
Calcium: 9.2 mg/dL (ref 8.9–10.3)
Chloride: 102 mmol/L (ref 98–111)
Creatinine, Ser: 0.73 mg/dL (ref 0.44–1.00)
GFR calc Af Amer: >60 mL/min (ref >60)
GFR calc non Af Amer: >60 mL/min (ref >60)
Glucose, Bld: 105 mg/dL — ABNORMAL HIGH (ref 70–99)
Potassium: 4 mmol/L (ref 3.5–5.1)
Sodium: 139 mmol/L (ref 135–145)

## 2019-12-06 LAB — COOXEMETRY PANEL
Carboxyhemoglobin: 1.8 % — ABNORMAL HIGH (ref 0.5–1.5)
Methemoglobin: 0.9 % (ref 0.0–1.5)
O2 Saturation: 68.3 %
Total hemoglobin: 14.5 g/dL (ref 12.0–16.0)

## 2019-12-06 LAB — CALCIUM, IONIZED: Calcium, Ionized, Serum: 5.2 mg/dL (ref 4.5–5.6)

## 2019-12-06 MED ORDER — SODIUM CHLORIDE 0.9 % IV SOLN
80.0000 mg | INTRAVENOUS | Status: AC
  Administered 2019-12-07: 80 mg
  Filled 2019-12-06: qty 2

## 2019-12-06 MED ORDER — SODIUM CHLORIDE 0.9 % IV SOLN: 250.0000 mL | INTRAVENOUS | Status: DC

## 2019-12-06 MED ORDER — SODIUM CHLORIDE 0.9 % IV SOLN: INTRAVENOUS | Status: DC

## 2019-12-06 MED ORDER — VANCOMYCIN HCL IN DEXTROSE 1-5 GM/200ML-% IV SOLN
1000.0000 mg | INTRAVENOUS | Status: AC
  Administered 2019-12-07: 1000 mg via INTRAVENOUS
  Filled 2019-12-06: qty 200

## 2019-12-06 MED ORDER — INSULIN ASPART 100 UNIT/ML ~~LOC~~ SOLN
0.0000 [IU] | Freq: Every day | SUBCUTANEOUS | Status: DC
  Administered 2019-12-06: 2 [IU] via SUBCUTANEOUS

## 2019-12-06 MED ORDER — SODIUM CHLORIDE 0.9% FLUSH: 3.0000 mL | INTRAVENOUS | Status: DC | PRN

## 2019-12-06 MED ORDER — CHLORHEXIDINE GLUCONATE 4 % EX LIQD
60.0000 mL | Freq: Once | CUTANEOUS | Status: AC
  Administered 2019-12-07: 4 via TOPICAL
  Filled 2019-12-06: qty 60

## 2019-12-06 MED ORDER — SACUBITRIL-VALSARTAN 49-51 MG PO TABS
1.0000 | ORAL_TABLET | Freq: Two times a day (BID) | ORAL | Status: DC
  Administered 2019-12-06 – 2019-12-08 (×5): 1 via ORAL
  Filled 2019-12-06 (×6): qty 1

## 2019-12-06 MED ORDER — SODIUM CHLORIDE 0.9% FLUSH
3.0000 mL | Freq: Two times a day (BID) | INTRAVENOUS | Status: DC
  Administered 2019-12-06 – 2019-12-07 (×3): 3 mL via INTRAVENOUS

## 2019-12-06 MED ORDER — CARVEDILOL 3.125 MG PO TABS
3.1250 mg | ORAL_TABLET | Freq: Two times a day (BID) | ORAL | Status: DC
  Administered 2019-12-06 – 2019-12-08 (×2): 3.125 mg via ORAL
  Filled 2019-12-06 (×5): qty 1

## 2019-12-06 MED ORDER — CHLORHEXIDINE GLUCONATE 4 % EX LIQD
60.0000 mL | Freq: Once | CUTANEOUS | Status: AC
  Administered 2019-12-07: 4 via TOPICAL

## 2019-12-06 MED ORDER — GLUCERNA SHAKE PO LIQD
237.0000 mL | Freq: Two times a day (BID) | ORAL | Status: DC
  Administered 2019-12-07: 237 mL via ORAL

## 2019-12-06 MED ORDER — PREDNISONE 20 MG PO TABS
40.0000 mg | ORAL_TABLET | Freq: Every day | ORAL | Status: DC
  Administered 2019-12-06 – 2019-12-08 (×2): 40 mg via ORAL
  Filled 2019-12-06 (×3): qty 2

## 2019-12-06 MED ORDER — MEXILETINE HCL 150 MG PO CAPS
150.0000 mg | ORAL_CAPSULE | Freq: Two times a day (BID) | ORAL | Status: DC
  Administered 2019-12-06 – 2019-12-07 (×4): 150 mg via ORAL
  Filled 2019-12-06 (×8): qty 1

## 2019-12-06 MED ORDER — INSULIN ASPART 100 UNIT/ML ~~LOC~~ SOLN
0.0000 [IU] | Freq: Three times a day (TID) | SUBCUTANEOUS | Status: DC
  Administered 2019-12-06: 2 [IU] via SUBCUTANEOUS
  Administered 2019-12-06: 5 [IU] via SUBCUTANEOUS
  Administered 2019-12-07: 2 [IU] via SUBCUTANEOUS

## 2019-12-06 NOTE — H&P (View-Only) (Signed)
Progress Note  Patient Name: Lisa Poole Date of Encounter: 12/06/2019  [facility] Cardiologist: Dr. Lovena Le  Subjective   Feels OK, no CP or SOB  Inpatient Medications    Scheduled Meds:  aspirin EC  81 mg Oral Daily   carvedilol  3.125 mg Oral BID WC   chlorhexidine gluconate (MEDLINE KIT)  15 mL Mouth Rinse BID   Chlorhexidine Gluconate Cloth  6 each Topical Daily   digoxin  0.125 mg Oral Daily   docusate sodium  100 mg Oral BID   enoxaparin (LOVENOX) injection  40 mg Subcutaneous Q24H   feeding supplement (ENSURE ENLIVE)  237 mL Oral BID BM   gabapentin  600 mg Oral BID   insulin aspart  0-5 Units Subcutaneous QHS   insulin aspart  0-9 Units Subcutaneous TID WC   insulin detemir  7 Units Subcutaneous BID   mexiletine  150 mg Oral Q12H   multivitamin with minerals  1 tablet Oral Daily   pantoprazole  40 mg Oral Daily   polyethylene glycol  17 g Oral Daily   pravastatin  10 mg Oral q1800   predniSONE  40 mg Oral Q breakfast   sacubitril-valsartan  1 tablet Oral BID   sodium chloride flush  10-40 mL Intracatheter Q12H   sodium chloride flush  3 mL Intravenous Q12H   spironolactone  25 mg Oral Daily   Continuous Infusions:  sodium chloride Stopped (12/04/19 1707)   PRN Meds: Place/Maintain arterial line **AND** sodium chloride, acetaminophen, ipratropium-albuterol, sodium chloride flush   Vital Signs    Vitals:   12/06/19 0438 12/06/19 0500 12/06/19 0600 12/06/19 0700  BP:  (!) 142/75 (!) 116/92 (!) 126/86  Pulse:  89 89   Resp:  (!) 8 (!) 24 (!) 24  Temp:      TempSrc:      SpO2:  96% 95%   Weight: 77 kg     Height:        Intake/Output Summary (Last 24 hours) at 12/06/2019 0805 Last data filed at 12/06/2019 0000 Gross per 24 hour  Intake 970 ml  Output --  Net 970 ml   Last 3 Weights 12/06/2019 12/05/2019 12/04/2019  Weight (lbs) 169 lb 12.1 oz 171 lb 1.2 oz 171 lb 4.8 oz  Weight (kg) 77 kg 77.6 kg 77.7 kg      Telemetry    AV pacing  100bpm, occ PVCs, rare couplet, NSVT 3-4 beats - Personally Reviewed  ECG    No new EKGs - Personally Reviewed  Physical Exam   GEN: No acute distress.   Neck: No JVD Cardiac: RRR, no murmurs, rubs, or gallops.  Respiratory: CTA b/l  GI: Soft, nontender, non-distended  MS: No edema; No deformity. Neuro:  Nonfocal  Psych: Normal affect    Labs    High Sensitivity Troponin:   Recent Labs  Lab 11/26/19 2012 11/26/19 2231 11/27/19 0402  TROPONINIHS 32* 52* 67*      Chemistry Recent Labs  Lab 12/04/19 0330 12/05/19 0543 12/06/19 0400  NA 134* 137 139  K 4.3 3.7 4.0  CL 91* 97* 102  CO2 34* 34* 30  GLUCOSE 228* 102* 105*  BUN 30* 20 14  CREATININE 1.47* 0.83 0.73  CALCIUM 8.8* 9.0 9.2  GFRNONAA 40* >60 >60  GFRAA 46* >60 >60  ANIONGAP _0 Hematology Recent Labs  Lab 12/04/19 0330 12/05/19 0543 12/06/19 0400  WBC 12.4* 12.9* 14.6*  RBC 4.97 4.81  5.20*  HGB 13.5 13.0 13.9  HCT 42.8 42.3 44.9  MCV 86.1 87.9 86.3  MCH 27.2 27.0 26.7  MCHC 31.5 30.7 31.0  RDW 15.2 14.9 15.0  PLT 238 270 321    BNPNo results for input(s): BNP, PROBNP in the last 168 hours.   DDimer No results for input(s): DDIMER in the last 168 hours.   Radiology    CT ABDOMEN PELVIS WO CONTRAST Result Date: 12/04/2019 CLINICAL DATA:  Retroperitoneal hematoma.  Right abdominal pain EXAM: CT ABDOMEN AND PELVIS WITHOUT CONTRAST TECHNIQUE: Multidetector CT imaging of the abdomen and pelvis was performed following the standard protocol without IV contrast. COMPARISON:  Chest x-ray from same day.  12/23/2018 FINDINGS: Lower chest: There are trace bilateral pleural effusions. There is atelectasis at the lung bases.The heart size is normal. Hepatobiliary: The liver is normal. Normal gallbladder.There is no biliary ductal dilation. Pancreas: Normal contours without ductal dilatation. No peripancreatic fluid collection. Spleen: There are multiple calcifications throughout the spleen.  Adrenals/Urinary Tract: --Adrenal glands: Unremarkable. --Right kidney/ureter: No hydronephrosis or radiopaque kidney stones. --Left kidney/ureter: No hydronephrosis or radiopaque kidney stones. --Urinary bladder: The urinary bladder is partially decompressed by Foley catheter. Stomach/Bowel: --Stomach/Duodenum: No hiatal hernia or other gastric abnormality. Normal duodenal course and caliber. --Small bowel: Unremarkable. --Colon: There is scattered colonic diverticula without CT evidence for diverticulitis. --Appendix: Surgically absent. Vascular/Lymphatic: There is an intra-aortic balloon pump in place. There is a femoral approach Swan-Ganz catheter. There is a moderate-sized right groin hematoma. No evidence for retroperitoneal hematoma. There is a left femoral approach arterial line that extends down into the left superficial femoral artery. --No retroperitoneal lymphadenopathy. --No mesenteric lymphadenopathy. --No pelvic or inguinal lymphadenopathy. Reproductive: Unremarkable Other: No ascites or free air. The abdominal wall is normal. Musculoskeletal. No acute displaced fractures. IMPRESSION: 1. Moderate-sized right groin hematoma without evidence for retroperitoneal hematoma. 2. Trace bilateral pleural effusions with atelectasis at the lung bases. 3. Scattered colonic diverticula without CT evidence for diverticulitis. Aortic Atherosclerosis (ICD10-I70.0). Electronically Signed   By: Constance Holster M.D.   On: 12/04/2019 00:22    DG CHEST PORT 1 VIEW Result Date: 12/03/2019 CLINICAL DATA:  Intra-aortic balloon pump. EXAM: PORTABLE CHEST 1 VIEW COMPARISON:  12/02/2019 FINDINGS: Endotracheal tube unchanged. Findings suggesting enteric tube coiled over the stomach with proximal port projecting over the body of the stomach and tip not visualized. Right-sided PICC line unchanged. Dual lead left-sided pacemaker unchanged. Intra-aortic balloon pump with tip over the descending thoracic aorta at the T7-8  level. Lungs are hypoinflated without focal airspace consolidation or effusion. Mild stable cardiomegaly. Remainder of the exam is unchanged. IMPRESSION: 1.  Hypoinflation without acute cardiopulmonary disease. 2.  Multiple tubes and lines as described. Electronically Signed   By: Marin Olp M.D.   On: 12/03/2019 08:09      Cardiac Studies   12/05/2019: c.MRI IMPRESSION: 1. Normal LV size with multiple wall motion abnormalities. The septum was relatively thin with severe hypokinesis and septal-lateral dyssynchrony, the anterior and inferior walls were hypokinetic. LV EF 30%.   2. Normal RV size with moderate to severely decreased systolic function, EF 37%. Pacemaker in RV.   3. Non-coronary LGE pattern. Patchy LGE in the basal septum, basal-mid inferoseptum/inferior wall, basal anterior wall, mid anterolateral wall. This could be consistent with cardiac sarcoidosis. Alternatively, could see similar pattern with prior myocarditis.   Loralie Champagne   12/01/2019 R/LHC 1. Elevated right and left heart filling pressures.  2. Low cardiac output on norepinephrine 3.  3. Mixed pulmonary venous/pulmonary arterial hypertension.  4. No significant coronary disease.  5. Successful IABP placement.      Echo 11/27/19 1. Left ventricular ejection fraction, by estimation, is 20 to 25%. The  left ventricle has severely decreased function. The left ventricle  demonstrates regional wall motion abnormalities (see scoring  diagram/findings for description). The left  ventricular internal cavity size was mildly dilated. Left ventricular  diastolic parameters are indeterminate.   2. Right ventricular systolic function is normal. The right ventricular  size is mildly enlarged. There is moderately elevated pulmonary artery  systolic pressure. The estimated right ventricular systolic pressure is  72.5 mmHg.   3. Left atrial size was mildly dilated.   4. Right atrial size was moderately dilated.    5. The mitral valve is normal in structure. Moderate mitral valve  regurgitation.   6. Tricuspid valve regurgitation is moderate to severe.   7. The aortic valve is grossly normal. Aortic valve regurgitation is  mild.   8. The inferior vena cava is normal in size with <50% respiratory  variability, suggesting right atrial pressure of 8 mmHg.   Comparison(s): Changes from prior study are noted.   Conclusion(s)/Recommendation(s): EF severely reduced compared to prior  studies. Wall motion abnormalities, most prominent in anterior, septal,  and lateral walls. Moderate to severe TR with elevated RVSP. Moderate MR.  LV thrombus excluded by definity  contrast. Brief VT during the exam.      Patient Profile      54 y.o. female w/PMHx of HTN, DM, HLD, CHB w/PPM, AFib  COVID infection April 2021 (not hospitalized)  Admitted to [facility] 11/27/2019 with progressive weakness, SOB found to be in acute CHF, LVEF noted marked reduction down to 20-25% w/ regional wall motion abnormalties,  most prominent in anterior, septal, and lateral walls. Moderate to severe TR with elevated RVSP. Moderate MR. LV thrombus excluded by definity contrast. RV systolic function normal.  Developed WCT > VT and NSVT started on amiodarone EP consulted, device function intact, VT was being undercounted by her device, with VT monitor rate set to > 170. We lowered to 150  LHC/RHC 7/23 with no significant CAD, elevated filling pressures, and low cardiac output on norepinephrine. IABP placed  Developed recurrent VT/torsades x 3 on 7/24. Had brief CPR and shocked. QT > 672m. Subsequently intubated  Pt has required pressors >> off all gtts yesterday Extubated yesterday  R groin hematoma last night, better this AM    Assessment & Plan    1. PMVT     Had QT prolongation with amiodarone > lidocaine > off 12/03/19     Pacing rate was increased to 100bpm >> reduced to 90 yesterday     She has had a few NSVT episodes,  generally rates are are 140's       Keep K+ 4.0 or greater, Mag 2.0 or greater  Mechanism of her VT unclear (Dr. KCaryl Comesnot entirely convinced that the mechanism of VT was TdP) C/MRI very abnormal, in d/w Dr. MAundra Dubinfavors Sarcoid    QT better by EKG 12/03/19   Dr. TLovena Lehas seen and examined the patient this morning Has had more VT Adding coreg and mexiletine today pacing at 90  planned for PPM > CRT-D tomorrow, Dr. TLovena Lerevisited this this morning, the patient remains agreeable  picc line out today.   Continue with CCM, AHF teams    For questions or updates, please contact [facility] Please consult [email or web address] for contact info  under     Signed, Baldwin [place], PA-C  12/06/2019, 8:05 AM    EP Attending  Patient seen and examined. Discussed with Dr. Aundra Dubin. Findings of cardiac MRI reviewed. I have discussed the plans for biv upgrade with the patient and she wishes to proceed. She continues to have VT so we will start mexilitine.  Mikle Bosworth.D.

## 2019-12-06 NOTE — Progress Notes (Addendum)
Progress Note  Patient Name: Lisa Poole Date of Encounter: 12/06/2019  [facility] Cardiologist: Dr. Lovena Le  Subjective   Feels OK, no CP or SOB  Inpatient Medications    Scheduled Meds:  aspirin EC  81 mg Oral Daily   carvedilol  3.125 mg Oral BID WC   chlorhexidine gluconate (MEDLINE KIT)  15 mL Mouth Rinse BID   Chlorhexidine Gluconate Cloth  6 each Topical Daily   digoxin  0.125 mg Oral Daily   docusate sodium  100 mg Oral BID   enoxaparin (LOVENOX) injection  40 mg Subcutaneous Q24H   feeding supplement (ENSURE ENLIVE)  237 mL Oral BID BM   gabapentin  600 mg Oral BID   insulin aspart  0-5 Units Subcutaneous QHS   insulin aspart  0-9 Units Subcutaneous TID WC   insulin detemir  7 Units Subcutaneous BID   mexiletine  150 mg Oral Q12H   multivitamin with minerals  1 tablet Oral Daily   pantoprazole  40 mg Oral Daily   polyethylene glycol  17 g Oral Daily   pravastatin  10 mg Oral q1800   predniSONE  40 mg Oral Q breakfast   sacubitril-valsartan  1 tablet Oral BID   sodium chloride flush  10-40 mL Intracatheter Q12H   sodium chloride flush  3 mL Intravenous Q12H   spironolactone  25 mg Oral Daily   Continuous Infusions:  sodium chloride Stopped (12/04/19 1707)   PRN Meds: Place/Maintain arterial line **AND** sodium chloride, acetaminophen, ipratropium-albuterol, sodium chloride flush   Vital Signs    Vitals:   12/06/19 0438 12/06/19 0500 12/06/19 0600 12/06/19 0700  BP:  (!) 142/75 (!) 116/92 (!) 126/86  Pulse:  89 89   Resp:  (!) 8 (!) 24 (!) 24  Temp:      TempSrc:      SpO2:  96% 95%   Weight: 77 kg     Height:        Intake/Output Summary (Last 24 hours) at 12/06/2019 0805 Last data filed at 12/06/2019 0000 Gross per 24 hour  Intake 970 ml  Output --  Net 970 ml   Last 3 Weights 12/06/2019 12/05/2019 12/04/2019  Weight (lbs) 169 lb 12.1 oz 171 lb 1.2 oz 171 lb 4.8 oz  Weight (kg) 77 kg 77.6 kg 77.7 kg      Telemetry    AV pacing  100bpm, occ PVCs, rare couplet, NSVT 3-4 beats - Personally Reviewed  ECG    No new EKGs - Personally Reviewed  Physical Exam   GEN: No acute distress.   Neck: No JVD Cardiac: RRR, no murmurs, rubs, or gallops.  Respiratory: CTA b/l  GI: Soft, nontender, non-distended  MS: No edema; No deformity. Neuro:  Nonfocal  Psych: Normal affect    Labs    High Sensitivity Troponin:   Recent Labs  Lab 11/26/19 2012 11/26/19 2231 11/27/19 0402  TROPONINIHS 32* 52* 67*      Chemistry Recent Labs  Lab 12/04/19 0330 12/05/19 0543 12/06/19 0400  NA 134* 137 139  K 4.3 3.7 4.0  CL 91* 97* 102  CO2 34* 34* 30  GLUCOSE 228* 102* 105*  BUN 30* 20 14  CREATININE 1.47* 0.83 0.73  CALCIUM 8.8* 9.0 9.2  GFRNONAA 40* >60 >60  GFRAA 46* >60 >60  ANIONGAP _0 Hematology Recent Labs  Lab 12/04/19 0330 12/05/19 0543 12/06/19 0400  WBC 12.4* 12.9* 14.6*  RBC 4.97 4.81  5.20*  HGB 13.5 13.0 13.9  HCT 42.8 42.3 44.9  MCV 86.1 87.9 86.3  MCH 27.2 27.0 26.7  MCHC 31.5 30.7 31.0  RDW 15.2 14.9 15.0  PLT 238 270 321    BNPNo results for input(s): BNP, PROBNP in the last 168 hours.   DDimer No results for input(s): DDIMER in the last 168 hours.   Radiology    CT ABDOMEN PELVIS WO CONTRAST Result Date: 12/04/2019 CLINICAL DATA:  Retroperitoneal hematoma.  Right abdominal pain EXAM: CT ABDOMEN AND PELVIS WITHOUT CONTRAST TECHNIQUE: Multidetector CT imaging of the abdomen and pelvis was performed following the standard protocol without IV contrast. COMPARISON:  Chest x-ray from same day.  12/23/2018 FINDINGS: Lower chest: There are trace bilateral pleural effusions. There is atelectasis at the lung bases.The heart size is normal. Hepatobiliary: The liver is normal. Normal gallbladder.There is no biliary ductal dilation. Pancreas: Normal contours without ductal dilatation. No peripancreatic fluid collection. Spleen: There are multiple calcifications throughout the spleen.  Adrenals/Urinary Tract: --Adrenal glands: Unremarkable. --Right kidney/ureter: No hydronephrosis or radiopaque kidney stones. --Left kidney/ureter: No hydronephrosis or radiopaque kidney stones. --Urinary bladder: The urinary bladder is partially decompressed by Foley catheter. Stomach/Bowel: --Stomach/Duodenum: No hiatal hernia or other gastric abnormality. Normal duodenal course and caliber. --Small bowel: Unremarkable. --Colon: There is scattered colonic diverticula without CT evidence for diverticulitis. --Appendix: Surgically absent. Vascular/Lymphatic: There is an intra-aortic balloon pump in place. There is a femoral approach Swan-Ganz catheter. There is a moderate-sized right groin hematoma. No evidence for retroperitoneal hematoma. There is a left femoral approach arterial line that extends down into the left superficial femoral artery. --No retroperitoneal lymphadenopathy. --No mesenteric lymphadenopathy. --No pelvic or inguinal lymphadenopathy. Reproductive: Unremarkable Other: No ascites or free air. The abdominal wall is normal. Musculoskeletal. No acute displaced fractures. IMPRESSION: 1. Moderate-sized right groin hematoma without evidence for retroperitoneal hematoma. 2. Trace bilateral pleural effusions with atelectasis at the lung bases. 3. Scattered colonic diverticula without CT evidence for diverticulitis. Aortic Atherosclerosis (ICD10-I70.0). Electronically Signed   By: Constance Holster M.D.   On: 12/04/2019 00:22    DG CHEST PORT 1 VIEW Result Date: 12/03/2019 CLINICAL DATA:  Intra-aortic balloon pump. EXAM: PORTABLE CHEST 1 VIEW COMPARISON:  12/02/2019 FINDINGS: Endotracheal tube unchanged. Findings suggesting enteric tube coiled over the stomach with proximal port projecting over the body of the stomach and tip not visualized. Right-sided PICC line unchanged. Dual lead left-sided pacemaker unchanged. Intra-aortic balloon pump with tip over the descending thoracic aorta at the T7-8  level. Lungs are hypoinflated without focal airspace consolidation or effusion. Mild stable cardiomegaly. Remainder of the exam is unchanged. IMPRESSION: 1.  Hypoinflation without acute cardiopulmonary disease. 2.  Multiple tubes and lines as described. Electronically Signed   By: Marin Olp M.D.   On: 12/03/2019 08:09      Cardiac Studies   12/05/2019: c.MRI IMPRESSION: 1. Normal LV size with multiple wall motion abnormalities. The septum was relatively thin with severe hypokinesis and septal-lateral dyssynchrony, the anterior and inferior walls were hypokinetic. LV EF 30%.   2. Normal RV size with moderate to severely decreased systolic function, EF 37%. Pacemaker in RV.   3. Non-coronary LGE pattern. Patchy LGE in the basal septum, basal-mid inferoseptum/inferior wall, basal anterior wall, mid anterolateral wall. This could be consistent with cardiac sarcoidosis. Alternatively, could see similar pattern with prior myocarditis.   Loralie Champagne   12/01/2019 R/LHC 1. Elevated right and left heart filling pressures.  2. Low cardiac output on norepinephrine 3.  3. Mixed pulmonary venous/pulmonary arterial hypertension.  4. No significant coronary disease.  5. Successful IABP placement.      Echo 11/27/19 1. Left ventricular ejection fraction, by estimation, is 20 to 25%. The  left ventricle has severely decreased function. The left ventricle  demonstrates regional wall motion abnormalities (see scoring  diagram/findings for description). The left  ventricular internal cavity size was mildly dilated. Left ventricular  diastolic parameters are indeterminate.   2. Right ventricular systolic function is normal. The right ventricular  size is mildly enlarged. There is moderately elevated pulmonary artery  systolic pressure. The estimated right ventricular systolic pressure is  47.9 mmHg.   3. Left atrial size was mildly dilated.   4. Right atrial size was moderately dilated.    5. The mitral valve is normal in structure. Moderate mitral valve  regurgitation.   6. Tricuspid valve regurgitation is moderate to severe.   7. The aortic valve is grossly normal. Aortic valve regurgitation is  mild.   8. The inferior vena cava is normal in size with <50% respiratory  variability, suggesting right atrial pressure of 8 mmHg.   Comparison(s): Changes from prior study are noted.   Conclusion(s)/Recommendation(s): EF severely reduced compared to prior  studies. Wall motion abnormalities, most prominent in anterior, septal,  and lateral walls. Moderate to severe TR with elevated RVSP. Moderate MR.  LV thrombus excluded by definity  contrast. Brief VT during the exam.      Patient Profile      54 y.o. female w/PMHx of HTN, DM, HLD, CHB w/PPM, AFib  COVID infection April 2021 (not hospitalized)  Admitted to [facility] 11/27/2019 with progressive weakness, SOB found to be in acute CHF, LVEF noted marked reduction down to 20-25% w/ regional wall motion abnormalties,  most prominent in anterior, septal, and lateral walls. Moderate to severe TR with elevated RVSP. Moderate MR. LV thrombus excluded by definity contrast. RV systolic function normal.  Developed WCT > VT and NSVT started on amiodarone EP consulted, device function intact, VT was being undercounted by her device, with VT monitor rate set to > 170. We lowered to 150  LHC/RHC 7/23 with no significant CAD, elevated filling pressures, and low cardiac output on norepinephrine. IABP placed  Developed recurrent VT/torsades x 3 on 7/24. Had brief CPR and shocked. QT > 600ms. Subsequently intubated  Pt has required pressors >> off all gtts yesterday Extubated yesterday  R groin hematoma last night, better this AM    Assessment & Plan    1. PMVT     Had QT prolongation with amiodarone > lidocaine > off 12/03/19     Pacing rate was increased to 100bpm >> reduced to 90 yesterday     She has had a few NSVT episodes,  generally rates are are 140's       Keep K+ 4.0 or greater, Mag 2.0 or greater  Mechanism of her VT unclear (Dr. Klein not entirely convinced that the mechanism of VT was TdP) C/MRI very abnormal, in d/w Dr. McLean favors Sarcoid    QT better by EKG 12/03/19   Dr. Obie Kallenbach has seen and examined the patient this morning Has had more VT Adding coreg and mexiletine today pacing at 90  planned for PPM > CRT-D tomorrow, Dr. Kealani Leckey revisited this this morning, the patient remains agreeable  picc line out today.   Continue with CCM, AHF teams    For questions or updates, please contact [facility] Please consult [email or web address] for contact info   under     Signed, Baldwin [place], PA-C  12/06/2019, 8:05 AM    EP Attending  Patient seen and examined. Discussed with Dr. Aundra Dubin. Findings of cardiac MRI reviewed. I have discussed the plans for biv upgrade with the patient and she wishes to proceed. She continues to have VT so we will start mexilitine.  Mikle Bosworth.D.

## 2019-12-06 NOTE — Progress Notes (Signed)
CARDIAC REHAB PHASE I   PRE:  Rate/Rhythm: 91 V pacing    BP: sitting 118/81    SaO2:   MODE:  Ambulation: 470 ft   POST:  Rate/Rhythm: 94 Vpacing with PVC    BP: sitting 124/100     SaO2: 100 RA  Ambulated without assist. Some SOB, rest x1. Fatigued after walk. Gave HF booklet and low sodium diets and HF video to view. Will discuss another day. [place], ACSM 12/06/2019 11:41 AM

## 2019-12-06 NOTE — Progress Notes (Addendum)
Nutrition Follow-up  DOCUMENTATION CODES:   Obesity unspecified  INTERVENTION:   -D/c Ensure Enlive -Glucerna Shake po BID, each supplement provides 220 kcal and 10 grams of protein -Continue MVI with minerals daily -Provided "Heart Healthy, Consistent Carbohydrate Nutrition Therapy" handout from [employer]; attached to AVS/ Discharge summary  NUTRITION DIAGNOSIS:   Increased nutrient needs related to acute illness as evidenced by estimated needs.  Ongoing  GOAL:   Patient will meet greater than or equal to 90% of their needs  Progressing   MONITOR:   PO intake, Supplement acceptance, Diet advancement, Labs, Weight trends, Skin, I & O's  REASON FOR ASSESSMENT:   Consult Enteral/tube feeding initiation and management  ASSESSMENT:   54 year old female admitted for acute decompensated heart failure after presenting with on and off worsening shortness of breath over the past couple of weeks, bilateral lower extremity swelling, decreased appetite due to early satiety, and 12 lb wt gain in the last week. Past medical history significant for NSTEMI, complete heart block s/p pacemaker, DM2, HTN, and HLD.  7/24 -intubated 7/25- extubated 7/26- IABP removed  Spoke with pt, husband, and son at bedside. All were pleasant, in good spirits, laughing and joking with this RD. Pt shares that she did not have an appetite for about 1-2 weeks PTA due to feeling unwell, usually consuming about one meal per day. Appetite has since improved; noted meal completion 50-100%. Observed lunch tray- pt consumed all except a few bites of [place] burger.   Pt reports that she and her husband cook at home and while they did not add salt to cooking, they do add salt at the table. Pt reports she is "getting used to" the lower sodium food options. RD discussed low sodium seasoning ideas.   Discussed importance of good meal and supplement intake to promote healing. Pt had no further  questions about CHF or nutrition at this time. She reports her husband and son have attended culinary school and her daughter is an Therapist, sports; all are very supportive and understanding of low sodium diet.   Medications reviewed and include colace, prednisone, and miralax.   Labs reviewed: CBGS: 93-184 (inpatient orders for glycemic control are 0-5 units insulin aspart daily at bedtime, 0-9 units insulin aspart TID with meals, and 7 units insulin detemir daily).   NUTRITION - FOCUSED PHYSICAL EXAM:    Most Recent Value  Orbital Region No depletion  Upper Arm Region No depletion  Thoracic and Lumbar Region No depletion  Buccal Region No depletion  Temple Region No depletion  Clavicle Bone Region No depletion  Clavicle and Acromion Bone Region No depletion  Scapular Bone Region No depletion  Dorsal Hand No depletion  Patellar Region No depletion  Anterior Thigh Region No depletion  Posterior Calf Region No depletion  Edema (RD Assessment) Mild  Hair Reviewed  Eyes Reviewed  Mouth Reviewed  Skin Reviewed  Nails Reviewed       Diet Order:   Diet Order            Diet NPO time specified Except for: Sips with Meds  Diet effective midnight           Diet heart healthy/carb modified Room service appropriate? Yes; Fluid consistency: Thin  Diet effective now                 EDUCATION NEEDS:   Education needs have been addressed  Skin:  Skin Assessment: Reviewed RN Assessment  Last BM:  12/05/19  Height:   Ht Readings from Last 1 Encounters:  11/26/19 5' (1.524 m)    Weight:   Wt Readings from Last 1 Encounters:  12/06/19 77 kg    Ideal Body Weight:  45.5 kg  BMI:  Body mass index is 33.15 kg/m.  Estimated Nutritional Needs:   Kcal:  1800-2000 kcal  Protein:  90-105 grams  Fluid:  >/= 1.8 L/day    Loistine Chance, RD, LDN, [place] Registered Dietitian II Certified Diabetes Care and Education Specialist Please refer to [facility] for RD and/or RD on-call/weekend/after  hours pager

## 2019-12-06 NOTE — Discharge Instructions (Addendum)
Supplemental Discharge Instructions for  Pacemaker/Defibrillator Patients  Activity No heavy lifting or vigorous activity with your left/right arm for 6 to 8 weeks.  Do not raise your left/right arm above your head for one week.  Gradually raise your affected arm as drawn below.              12/11/2019                  12/12/2019                  12/13/2019                 12/14/2019 __  NO DRIVING for 6 months  WOUND CARE - Keep the wound area clean and dry.  Do not get this area wet for one week. No showers for one week; you may shower on 12/14/2019 . - The tape/steri-strips on your wound will fall off; do not pull them off.  No bandage is needed on the site.  DO  NOT apply any creams, oils, or ointments to the wound area. - If you notice any drainage or discharge from the wound, any swelling or bruising at the site, or you develop a fever > 101? F after you are discharged home, call the office at once.  Special Instructions - You are still able to use cellular telephones; use the ear opposite the side where you have your pacemaker/defibrillator.  Avoid carrying your cellular phone near your device. - When traveling through airports, show security personnel your identification card to avoid being screened in the metal detectors.  Ask the security personnel to use the hand wand. - Avoid arc welding equipment, MRI testing (magnetic resonance imaging), TENS units (transcutaneous nerve stimulators).  Call the office for questions about other devices. - Avoid electrical appliances that are in poor condition or are not properly grounded. - Microwave ovens are safe to be near or to operate.  Additional information for defibrillator patients should your device go off: - If your device goes off ONCE and you feel fine afterward, notify the device clinic nurses. - If your device goes off ONCE and you do not feel well afterward, call 911. - If your device goes off TWICE, call 911. - If your device goes  off THREE times in one day, call 911.  DO NOT DRIVE YOURSELF OR A FAMILY MEMBER WITH A DEFIBRILLATOR TO THE HOSPITAL--CALL 911.   Heart Healthy, Consistent Carbohydrate Nutrition Therapy   A heart-healthy and consistent carbohydrate diet is recommended to manage heart disease and diabetes. To follow a heart-healthy and consistent carbohydrate diet, . Eat a balanced diet with whole grains, fruits and vegetables, and lean protein sources.  . Choose heart-healthy unsaturated fats. Limit saturated fats, trans fats, and cholesterol intake. Eat more plant-based or vegetarian meals using beans and soy foods for protein.  . Eat whole, unprocessed foods to limit the amount of sodium (salt) you eat.  . Choose a consistent amount of carbohydrate at each meal and snack. Limit refined carbohydrates especially sugar, sweets and sugar-sweetened beverages.  . If you drink alcohol, do so in moderation: one serving per day (women) and two servings per day (men). o One serving is equivalent to 12 ounces beer, 5 ounces wine, or 1.5 ounces distilled spirits  Tips Tips for Choosing Heart-Healthy Fats Choose lean protein and low-fat dairy foods to reduce saturated fat intake. . Saturated fat is usually found in animal-based protein and is associated with  certain health risks. Saturated fat is the biggest contributor to raise low-density lipoprotein (LDL) cholesterol levels. Research shows that limiting saturated fat lowers unhealthy cholesterol levels. Eat no more than 7% of your total calories each day from saturated fat. Ask your RDN to help you determine how much saturated fat is right for you. . There are many foods that do not contain large amounts of saturated fats. Swapping these foods to replace foods high in saturated fats will help you limit the saturated fat you eat and improve your cholesterol levels. You can also try eating more plant-based or vegetarian meals. Instead of. Try:  Whole milk, cheese,  yogurt, and ice cream 1% or skim milk, low-fat cheese, non-fat yogurt, and low-fat ice cream  Fatty, marbled beef and pork Lean beef, pork, or venison  Poultry with skin Poultry without skin  Butter, stick margarine Reduced-fat, whipped, or liquid spreads  Coconut oil, palm oil Liquid vegetable oils: corn, canola, olive, soybean and safflower oils   Avoid foods that contain trans fats. . Trans fats increase levels of LDL-cholesterol. Hydrogenated fat in processed foods is the main source of trans fats in foods.  . Trans fats can be found in stick margarine, shortening, processed sweets, baked goods, some fried foods, and packaged foods made with hydrogenated oils. Avoid foods with "partially hydrogenated oil" on the ingredient list such as: cookies, pastries, baked goods, biscuits, crackers, microwave popcorn, and frozen dinners. Choose foods with heart healthy fats. . Polyunsaturated and monounsaturated fat are unsaturated fats that may help lower your blood cholesterol level when used in place of saturated fat in your diet. . Ask your RDN about taking a dietary supplement with plant sterols and stanols to help lower your cholesterol level. Marland Kitchen Research shows that substituting saturated fats with unsaturated fats is beneficial to cholesterol levels. Try these easy swaps: Instead of. Try:  Butter, stick margarine, or solid shortening Reduced-fat, whipped, or liquid spreads  Beef, pork, or poultry with skin Fish and seafood  Chips, crackers, snack foods Raw or unsalted nuts and seeds or nut butters Hummus with vegetables Avocado on toast  Coconut oil, palm oil Liquid vegetable oils: corn, canola, olive, soybean and safflower oils  Limit the amount of cholesterol you eat to less than 200 milligrams per day. . Cholesterol is a substance carried through the bloodstream via lipoproteins, which are known as "transporters" of fat. Some body functions need cholesterol to work properly, but too much  cholesterol in the bloodstream can damage arteries and build up blood vessel linings (which can lead to heart attack and stroke). You should eat less than 200 milligrams cholesterol per day. Marland Kitchen People respond differently to eating cholesterol. There is no test available right now that can figure out which people will respond more to dietary cholesterol and which will respond less. For individuals with high intake of dietary cholesterol, different types of increase (none, small, moderate, large) in LDL-cholesterol levels are all possible.  . Food sources of cholesterol include egg yolks and organ meats such as liver, gizzards. Limit egg yolks to two to four per week and avoid organ meats like liver and gizzards to control cholesterol intake. Tips for Choosing Heart-Healthy Carbohydrates Consume a consistent amount of carbohydrate . It is important to eat foods with carbohydrates in moderation because they impact your blood glucose level. Carbohydrates can be found in many foods such as: . Grains (breads, crackers, rice, pasta, and cereals)  . Starchy Vegetables (potatoes, corn, and peas)  . Beans  and legumes  . Milk, soy milk, and yogurt  . Fruit and fruit juice  . Sweets (cakes, cookies, ice cream, jam and jelly) . Your RDN will help you set a goal for how many carbohydrate servings to eat at your meals and snacks. For many adults, eating 3 to 5 servings of carbohydrate foods at each meal and 1 or 2 carbohydrate servings for each snack works well.  . Check your blood glucose level regularly. It can tell you if you need to adjust when you eat carbohydrates. . Choose foods rich in viscous (soluble) fiber . Viscous, or soluble, is found in the walls of plant cells. Viscous fiber is found only in plant-based foods. Eating foods with fiber helps to lower your unhealthy cholesterol and keep your blood glucose in range  . Rich sources of viscous fiber include vegetables (asparagus, Brussels sprouts, sweet  potatoes, turnips) fruit (apricots, mangoes, oranges), legumes, and whole grains (barley, oats, and oat bran).  . As you increase your fiber intake gradually, also increase the amount of water you drink. This will help prevent constipation.  . If you have difficulty achieving this goal, ask your RDN about fiber laxatives. Choose fiber supplements made with viscous fibers such as psyllium seed husks or methylcellulose to help lower unhealthy cholesterol.  . Limit refined carbohydrates  . There are three types of carbohydrates: starches, sugar, and fiber. Some carbohydrates occur naturally in food, like the starches in rice or corn or the sugars in fruits and milk. Refined carbohydrates--foods with high amounts of simple sugars--can raise triglyceride levels. High triglyceride levels are associated with coronary heart disease. . Some examples of refined carbohydrate foods are table sugar, sweets, and beverages sweetened with added sugar. Tips for Reducing Sodium (Salt) Although sodium is important for your body to function, too much sodium can be harmful for people with high blood pressure. As sodium and fluid buildup in your tissues and bloodstream, your blood pressure increases. High blood pressure may cause damage to other organs and increase your risk for a stroke. Even if you take a pill for blood pressure or a water pill (diuretic) to remove fluid, it is still important to have less salt in your diet. Ask your doctor and RDN what amount of sodium is right for you. Marland Kitchen Avoid processed foods. Eat more fresh foods.  . Fresh fruits and vegetables are naturally low in sodium, as well as frozen vegetables and fruits that have no added juices or sauces.  . Fresh meats are lower in sodium than processed meats, such as bacon, sausage, and hotdogs. Read the nutrition label or ask your butcher to help you find a fresh meat that is low in sodium. . Eat less salt--at the table and when cooking.  . A single  teaspoon of table salt has 2,300 mg of sodium.  . Leave the salt out of recipes for pasta, casseroles, and soups.  . Ask your RDN how to cook your favorite recipes without sodium . Be a Paramedic.  . Look for food packages that say "salt-free" or "sodium-free." These items contain less than 5 milligrams of sodium per serving.  Marland Kitchen "Very low-sodium" products contain less than 35 milligrams of sodium per serving.  Marland Kitchen "Low-sodium" products contain less than 140 milligrams of sodium per serving.  . Beware for "Unsalted" or "No Added Salt" products. These items may still be high in sodium. Check the nutrition label. . Add flavors to your food without adding sodium.  . Try  lemon juice, lime juice, fruit juice or vinegar.  . Dry or fresh herbs add flavor. Try basil, bay leaf, dill, rosemary, parsley, sage, dry mustard, nutmeg, thyme, and paprika.  . Pepper, red pepper flakes, and cayenne pepper can add spice t your meals without adding sodium. Hot sauce contains sodium, but if you use just a drop or two, it will not add up to much.  Sharyn Lull a sodium-free seasoning blend or make your own at home. Additional Lifestyle Tips Achieve and maintain a healthy weight. . Talk with your RDN or your doctor about what is a healthy weight for you. . Set goals to reach and maintain that weight.  . To lose weight, reduce your calorie intake along with increasing your physical activity. A weight loss of 10 to 15 pounds could reduce LDL-cholesterol by 5 milligrams per deciliter. Participate in physical activity. . Talk with your health care team to find out what types of physical activity are best for you. Set a plan to get about 30 minutes of exercise on most days.  Foods Recommended Food Group Foods Recommended  Grains Whole grain breads and cereals, including whole wheat, barley, rye, buckwheat, corn, teff, quinoa, millet, amaranth, brown or wild rice, sorghum, and oats Pasta, especially whole wheat or other whole  grain types  [employer], quinoa or wild rice Whole grain crackers, bread, rolls, pitas Home-made bread with reduced-sodium baking soda  Protein Foods Lean cuts of beef and pork (loin, leg, round, extra lean hamburger)  Skinless Cytogeneticist and other wild game Dried beans and peas Nuts and nut butters Meat alternatives made with soy or textured vegetable protein  Egg whites or egg substitute Cold cuts made with lean meat or soy protein  Dairy Nonfat (skim), low-fat, or 1%-fat milk  Nonfat or low-fat yogurt or cottage cheese Fat-free and low-fat cheese  Vegetables Fresh, frozen, or canned vegetables without added fat or salt   Fruits Fresh, frozen, canned, or dried fruit   Oils Unsaturated oils (corn, olive, peanut, soy, sunflower, canola)  Soft or liquid margarines and vegetable oil spreads  Salad dressings Seeds and nuts  Avocado   Foods Not Recommended Food Group Foods Not Recommended  Grains Breads or crackers topped with salt Cereals (hot or cold) with more than 300 mg sodium per serving Biscuits, cornbread, and other "quick" breads prepared with baking soda Bread crumbs or stuffing mix from a store High-fat bakery products, such as doughnuts, biscuits, croissants, danish pastries, pies, cookies Instant cooking foods to which you add hot water and stir--potatoes, noodles, rice, etc. Packaged starchy foods--seasoned noodle or rice dishes, stuffing mix, macaroni and cheese dinner Snacks made with partially hydrogenated oils, including chips, cheese puffs, snack mixes, regular crackers, butter-flavored popcorn  Protein Foods Higher-fat cuts of meats (ribs, t-bone steak, regular hamburger) Bacon, sausage, or hot dogs Cold cuts, such as salami or bologna, deli meats, cured meats, corned beef Organ meats (liver, brains, gizzards, sweetbreads) Poultry with skin Fried or smoked meat, poultry, and fish Whole eggs and egg yolks (more than 2-4 per week) Salted legumes, nuts,  seeds, or nut/seed butters Meat alternatives with high levels of sodium (>300 mg per serving) or saturated fat (>5 g per serving)  Dairy Whole milk,?2% fat milk, buttermilk Whole milk yogurt or ice cream Cream Half-&-half Cream cheese Sour cream Cheese  Vegetables Canned or frozen vegetables with salt, fresh vegetables prepared with salt, butter, cheese, or cream sauce Fried vegetables Pickled vegetables such as olives, pickles, or  sauerkraut  Fruits Fried fruits Fruits served with butter or cream  Oils Butter, stick margarine, shortening Partially hydrogenated oils or trans fats Tropical oils (coconut, palm, palm kernel oils)  Other Candy, sugar sweetened soft drinks and desserts Salt, sea salt, garlic salt, and seasoning mixes containing salt Bouillon cubes Ketchup, barbecue sauce, Worcestershire sauce, soy sauce, teriyaki sauce Miso Salsa Pickles, olives, relish   Heart Healthy Consistent Carbohydrate Vegetarian (Lacto-Ovo) Sample 1-Day Menu  Breakfast 1 cup oatmeal, cooked (2 carbohydrate servings)   cup blueberries (1 carbohydrate serving)  11 almonds, without salt  1 cup 1% milk (1 carbohydrate serving)  1 cup coffee  Morning Snack 1 cup fat-free plain yogurt (1 carbohydrate serving)  Lunch 1 whole wheat bun (1 carbohydrate servings)  1 black bean burger (1 carbohydrate servings)  1 slice cheddar cheese, low sodium  2 slices tomatoes  2 leaves lettuce  1 teaspoon mustard  1 small pear (1 carbohydrate servings)  1 cup green tea, unsweetened  Afternoon Snack 1/3 cup trail mix with nuts, seeds, and raisins, without salt (1 carbohydrate servinga)  Evening Meal  cup meatless chicken  2/3 cup brown rice, cooked (2 carbohydrate servings)  1 cup broccoli, cooked (2/3 carbohydrate serving)   cup carrots, cooked (1/3 carbohydrate serving)  2 teaspoons olive oil  1 teaspoon balsamic vinegar  1 whole wheat dinner roll (1 carbohydrate serving)  1 teaspoon margarine,  soft, tub  1 cup 1% milk (1 carbohydrate serving)  Evening Snack 1 extra small banana (1 carbohydrate serving)  1 tablespoon peanut butter   Heart Healthy Consistent Carbohydrate Vegan Sample 1-Day Menu  Breakfast 1 cup oatmeal, cooked (2 carbohydrate servings)   cup blueberries (1 carbohydrate serving)  11 almonds, without salt  1 cup soymilk fortified with calcium, vitamin B12, and vitamin D  1 cup coffee  Morning Snack 6 ounces soy yogurt (1 carbohydrate servings)  Lunch 1 whole wheat bun(1 carbohydrate servings)  1 black bean burger (1 carbohydrate serving)  2 slices tomatoes  2 leaves lettuce  1 teaspoon mustard  1 small pear (1 carbohydrate servings)  1 cup green tea, unsweetened  Afternoon Snack 1/3 cup trail mix with nuts, seeds, and raisins, without salt (1 carbohydrate servings)  Evening Meal  cup meatless chicken  2/3 cup brown rice, cooked (2 carbohydrate servings)  1 cup broccoli, cooked (2/3 carbohydrate serving)   cup carrots, cooked (1/3 carbohydrate serving)  2 teaspoons olive oil  1 teaspoon balsamic vinegar  1 whole wheat dinner roll (1 carbohydrate serving)  1 teaspoon margarine, soft, tub  1 cup soymilk fortified with calcium, vitamin B12, and vitamin D  Evening Snack 1 extra small banana (1 carbohydrate serving)  1 tablespoon peanut butter    Heart Healthy Consistent Carbohydrate Sample 1-Day Menu  Breakfast 1 cup cooked oatmeal (2 carbohydrate servings)  3/4 cup blueberries (1 carbohydrate serving)  1 ounce almonds  1 cup skim milk (1 carbohydrate serving)  1 cup coffee  Morning Snack 1 cup sugar-free nonfat yogurt (1 carbohydrate serving)  Lunch 2 slices whole-wheat bread (2 carbohydrate servings)  2 ounces lean [place] breast  1 ounce low-fat Swiss cheese  1 teaspoon mustard  1 slice tomato  1 lettuce leaf  1 small pear (1 carbohydrate serving)  1 cup skim milk (1 carbohydrate serving)  Afternoon Snack 1 ounce trail mix with unsalted nuts,  seeds, and raisins (1 carbohydrate serving)  Evening Meal 3 ounces salmon  2/3 cup cooked brown rice (2 carbohydrate servings)  1 teaspoon soft margarine  1 cup cooked broccoli with 1/2 cup cooked carrots (1 carbohydrate serving  Carrots, cooked, boiled, drained, without salt  1 cup lettuce  1 teaspoon olive oil with vinegar for dressing  1 small whole grain roll (1 carbohydrate serving)  1 teaspoon soft margarine  1 cup unsweetened tea  Evening Snack 1 extra-small banana (1 carbohydrate serving)  Copyright 2020  [employer]. All rights reserved.

## 2019-12-06 NOTE — Progress Notes (Addendum)
Patient ID: Lisa Poole, female   DOB: 12-24-1965, 54 y.o.   MRN: [id number]     Advanced Heart Failure Rounding Note  PCP-Cardiologist: No primary care provider on file.   Subjective:    Feels ok. Only complaint is slight headache. Continues w/ runs of NSVT on tele.   VSS. BP tolerating Entresto. SCr/K ok. Wt down another 2 lb. CVP 3.   Co-ox pending.   cMRI completed yesterday>>LVEF 30%. RVEF 27%. + patchy LGE in the basal septum, basal-mid inferoseptum/inferior wall, basal anterior wall, mid anterolateral wall. This could be consistent with cardiac sarcoidosis. Alternatively, could see similar pattern with prior myocarditis.  Objective:   Weight Range: 77 kg Body mass index is 33.15 kg/m.   Vital Signs:   Temp:  [98.1 F (36.7 C)-98.6 F (37 C)] 98.6 F (37 C) (07/28 0400) Pulse Rate:  [86-98] 89 (07/28 0600) Resp:  [8-24] 24 (07/28 0700) BP: (113-184)/(46-106) 126/86 (07/28 0700) SpO2:  [92 %-100 %] 95 % (07/28 0600) Weight:  [77 kg] 77 kg (07/28 0438) Last BM Date: 12/05/19  Weight change: Filed Weights   12/04/19 0356 12/05/19 0500 12/06/19 0438  Weight: 77.7 kg 77.6 kg 77 kg    Intake/Output:   Intake/Output Summary (Last 24 hours) at 12/06/2019 0711 Last data filed at 12/06/2019 0000 Gross per 24 hour  Intake 1210 ml  Output --  Net 1210 ml      Physical Exam    CVP 3 General:  Well appearing, laying in bed. No resp difficulty HEENT: normal Neck: supple. no JVD. Carotids 2+ bilat; no bruits. No lymphadenopathy or thryomegaly appreciated. Cor: PMI nondisplaced. Regular rate and rhythm Lungs: CTAB   Abdomen: soft, nontender, nondistended. No hepatosplenomegaly. No bruits or masses. Good bowel sounds. Extremities: no cyanosis, clubbing, rash, edema +right groin w/ hematoma improving  Neuro: alert & orientedx3, cranial nerves grossly intact. moves all 4 extremities w/o difficulty. Affect pleasant   Telemetry   A-paced  90. NSVT Personally  reviewed    Labs    CBC Recent Labs    12/05/19 0543 12/06/19 0400  WBC 12.9* 14.6*  NEUTROABS 8.3* 8.6*  HGB 13.0 13.9  HCT 42.3 44.9  MCV 87.9 86.3  PLT 270 329   Basic Metabolic Panel Recent Labs    12/04/19 0330 12/04/19 0330 12/05/19 0543 12/06/19 0400  NA 134*   < > 137 139  K 4.3   < > 3.7 4.0  CL 91*   < > 97* 102  CO2 34*   < > 34* 30  GLUCOSE 228*   < > 102* 105*  BUN 30*   < > 20 14  CREATININE 1.47*   < > 0.83 0.73  CALCIUM 8.8*   < > 9.0 9.2  MG 2.0  --  2.0  --    < > = values in this interval not displayed.   Liver Function Tests No results for input(s): AST, ALT, ALKPHOS, BILITOT, PROT, ALBUMIN in the last 72 hours. No results for input(s): LIPASE, AMYLASE in the last 72 hours. Cardiac Enzymes No results for input(s): CKTOTAL, CKMB, CKMBINDEX, TROPONINI in the last 72 hours.  BNP: BNP (last 3 results) Recent Labs    11/26/19 2011  BNP 1,258.1*    ProBNP (last 3 results) No results for input(s): PROBNP in the last 8760 hours.   D-Dimer No results for input(s): DDIMER in the last 72 hours. Hemoglobin A1C No results for input(s): HGBA1C in the last 72 hours. Fasting  Lipid Panel No results for input(s): CHOL, HDL, LDLCALC, TRIG, CHOLHDL, LDLDIRECT in the last 72 hours. Thyroid Function Tests No results for input(s): TSH, T4TOTAL, T3FREE, THYROIDAB in the last 72 hours.  Invalid input(s): FREET3  Other results:   Imaging    MR CARDIAC MORPHOLOGY W WO CONTRAST  Result Date: 12/05/2019 CLINICAL DATA:  Cardiomyopathy of uncertain etiology EXAM: CARDIAC MRI TECHNIQUE: The patient was scanned on a 1.5 Tesla GE magnet. A dedicated cardiac coil was used. Functional imaging was done using Fiesta sequences. 2,3, and 4 chamber views were done to assess for RWMA's. Modified Simpson's rule using a short axis stack was used to calculate an ejection fraction on a dedicated work Conservation officer, nature. The patient received 8 cc of Gadavist.  After 10 minutes inversion recovery sequences were used to assess for infiltration and scar tissue. CONTRAST:  Gadavist 8 cc FINDINGS: Limited images of the lung fields showed no gross abnormalities. Normal left ventricular size and wall thickness. The basal septal wall was thin. The septal wall was severely hypokinetic with septal-lateral dyssynchrony. The lateral wall was relatively preserved with hypokinesis of the inferior and anterior walls. LV EF calculated at 30%. There was a pacemaker in the right ventricle. Normal right ventricular size with EF 27%. Normal left and right atrial sizes. Mildly thickened, trileaflet aortic valve, no significant regurgitation or stenosis. No significant mitral regurgitation noted. Delayed enhancement imaging: The basal septal wall was thinned with a small area of near-full thickness late gadolinium enhancement (LGE). There was patchy, primarily mid-wall basal to mid inferoseptal and inferior LGE. Small areas of mid-wall basal anterior and mid anterolateral LGE. Measurements: LVEDV 123 mL LVSV 37 mL LVEF 30% RVEDV 89 mL RVSV 24 mL RVEF 27% IMPRESSION: 1. Normal LV size with multiple wall motion abnormalities. The septum was relatively thin with severe hypokinesis and septal-lateral dyssynchrony, the anterior and inferior walls were hypokinetic. LV EF 30%. 2. Normal RV size with moderate to severely decreased systolic function, EF 03%. Pacemaker in RV. 3. Non-coronary LGE pattern. Patchy LGE in the basal septum, basal-mid inferoseptum/inferior wall, basal anterior wall, mid anterolateral wall. This could be consistent with cardiac sarcoidosis. Alternatively, could see similar pattern with prior myocarditis.   Electronically Signed   By: Loralie Champagne M.D.   On: 12/05/2019 16:59     Medications:     Scheduled Medications: . aspirin EC  81 mg Oral Daily  . chlorhexidine gluconate (MEDLINE KIT)  15 mL Mouth Rinse BID  . Chlorhexidine Gluconate Cloth  6 each  Topical Daily  . digoxin  0.125 mg Oral Daily  . docusate sodium  100 mg Oral BID  . enoxaparin (LOVENOX) injection  40 mg Subcutaneous Q24H  . feeding supplement (ENSURE ENLIVE)  237 mL Oral BID BM  . gabapentin  600 mg Oral BID  . insulin aspart  0-15 Units Subcutaneous TID WC  . insulin aspart  0-5 Units Subcutaneous QHS  . insulin detemir  7 Units Subcutaneous BID  . methylPREDNISolone (SOLU-MEDROL) injection  40 mg Intravenous Daily  . multivitamin with minerals  1 tablet Oral Daily  . pantoprazole  40 mg Oral Daily  . polyethylene glycol  17 g Oral Daily  . pravastatin  10 mg Oral q1800  . sacubitril-valsartan  1 tablet Oral BID  . sodium chloride flush  10-40 mL Intracatheter Q12H  . sodium chloride flush  3 mL Intravenous Q12H  . spironolactone  25 mg Oral Daily    Infusions: . sodium  chloride Stopped (12/04/19 1707)    PRN Medications: Place/Maintain arterial line **AND** sodium chloride, acetaminophen, ipratropium-albuterol, sodium chloride flush    Assessment/Plan   1. Acute systolic CHF: Nonischemic cardiomyopathy.  Echo this admission with EF newly decreased to 20-25% with septal-lateral dyssynchrony and septal severe hypokinesis, normal RV, moderate TR, moderate MR. Echo in 5/20 prior to PPM placement showed EF 60-65%.  Possible causes of cardiomyopathy include chronic RV pacing, frequent NSVT/PVCs, ?CAD (has RFs), ?viral myocarditis (relatively recent COVID-19 infection).  No ETOH/drugs.  She has had increased dyspnea/palpitations x several weeks. Volume overloaded and CXR suggested pulmonary edema at time of admit. Developed CGS overnight 7/20 w/ hypotension and narrow pulse pressure, w/ subsequent AKI, SCr 1.18>>1.85>>2.26. Lactic acid increased from 2.1>>5.0. Diuresis subsequently limited. She was initially on dopamine, then norepinephrine, milrinone, and back to norepinephrine.  Least ventricular ectopy on norepinephrine.  LHC/RHC 7/23 with no significant CAD,  elevated filling pressures, and low cardiac output on norepinephrine. IABP placed. IABP removed 7/26. Off pressors. Co-ox pending. Volume status stable, CVP 3. cMRI 7/27 showed patchy LGE in the basal septum, basal-mid inferoseptum/inferior wall, basal anterior wall, mid anterolateral wall. This could be consistent with cardiac sarcoidosis. Alternatively, could see similar pattern with prior myocarditis. No evidence for pulmonary sarcoidosis per pulmonology.  - Continue to hold loop diuretics today.  - Continue digoxin 0.125 daily. Dig level ok - Continue spironolactone 25 mg daily  - Increase Entresto to 49/51 mg bid - Start Coreg 3.125 mg bid  - She has left bundle pacing as an alternative to CRT => QRS in 130s.  She will need ICD upgrade. EP following, plan upgrade tomorrow.  - Solumedrol started 7/24 for presumed sarcoid, decreased to daily.  - Will need PET scan done at [place] may be related to frequent VT/PVCs. Continue amio  2. Wide complex tachycardia: VT with multiple morphologies.  Increased ventricular arrhythmias over 6 months.  ?Underlying cause.  No significant coronary disease.  Consider infiltrative disease like sarcoidosis.  Increased VT runs with milrinone and dopamine, off both, did better with norepinephrine. IABP implanted 7/23=>removed 7/26.   Torsades/VT x 3 on 7/24 with prolonged QT required DC-CV. Amio switched to lido. Lido stopped 7/26 by Dr. Caryl Comes.  - Continue atrial pacing at 90 - Keep K > 4.0 Mg > 2.0 - EP following. - Solumedrol started 7/24 for presumed sarcoid => she can transition to prednisone 40 mg daily.   - Will eventually need cardiac PET as well as ICD upgrade.  3. H/o CHB: Cause is uncertain.  Cath in 5/20 prior to PPM showed no significant disease.  - cMRI completed. Cannot r/o sarcoidosis. Needs cardiac PET scan.  4. AKI: In setting of hypotension + diuresis. Diuretics held. Now resolved.  - SCr improved/ normalized today at 0.73. w/ low  CVP, will continue to hold loop diuretics.  5. Right Groin Hematoma: Developed 7/26 at IABP site. Femstop placed.  Noncontrast abd/pelvic CT showed moderate right groin hematoma but no retroperitoneal bleed. H/H stable. Improving.   Plan transfer to progressive care unit today. Ambulate w/ PT.    Length of Stay: [address] Ladoris Gene 12/06/2019 7:11 AM  Patient seen with PA, agree with the above note.   No complaints today.  She has walked in the halls without dyspnea.  CVP 3, co-ox pending.  Creatinine stable.  Still having some NSVT and PVCs.   General: NAD Neck: No JVD, no thyromegaly or thyroid nodule.  Lungs:  Clear to auscultation bilaterally with normal respiratory effort. CV: Nondisplaced PMI.  Heart regular S1/S2, no S3/S4, no murmur.  No peripheral edema.   Abdomen: Soft, nontender, no hepatosplenomegaly, no distention.  Skin: Intact without lesions or rashes.  Neurologic: Alert and oriented x 3.  Psych: Normal affect. Extremities: No clubbing or cyanosis.  HEENT: Normal.   Plan for upgrade to CRT-D tomorrow with Dr. Lovena Le.   Today, will add Coreg 3.125 mg bid and increase Entresto to 49/51 bid.  She has plenty of BP room.   Ongoing NSVT, add mexiletine 150 mg bid.   Cardiac MRI with LGE pattern that could be consistent with cardiac sarcoidosis (would explain earlier CHB).  Alternatively, prior viral myocarditis (recent COVID-19).    - Stop Solumedrol and start prednisone 40 mg daily for treatment of presumptive cardiac sarcoidosis.  - Would like to arrange for cardiac PET as outpatient to help confirm diagnosis given lack of definite evidence for pulmonary sarcoidosis from CTA chest in 10/20.  I will also order high resolution CT chest w/o contrast to look for any evidence of pulmonary sarcoidosis.   Can transfer to progressive care.   Loralie Champagne 12/06/2019 7:39 AM

## 2019-12-07 ENCOUNTER — Encounter (HOSPITAL_COMMUNITY): Payer: Self-pay | Admitting: Internal Medicine

## 2019-12-07 ENCOUNTER — Encounter (HOSPITAL_COMMUNITY)
Admission: EM | Disposition: A | Discharge status: Home / Self Care | Payer: Self-pay | Source: Home / Self Care | Attending: Pulmonary Disease

## 2019-12-07 DIAGNOSIS — I472 Ventricular tachycardia: Secondary | ICD-10-CM

## 2019-12-07 DIAGNOSIS — I442 Atrioventricular block, complete: Secondary | ICD-10-CM

## 2019-12-07 HISTORY — PX: BIV ICD INSERTION CRT-D: EP1195

## 2019-12-07 LAB — CBC WITH DIFFERENTIAL/PLATELET
Abs Immature Granulocytes: 0.06 10*3/uL (ref 0.00–0.07)
Basophils Absolute: 0 10*3/uL (ref 0.0–0.1)
Basophils Relative: 0 %
Eosinophils Absolute: 0.2 10*3/uL (ref 0.0–0.5)
Eosinophils Relative: 2 %
HCT: 43.3 % (ref 36.0–46.0)
Hemoglobin: 13.5 g/dL (ref 12.0–15.0)
Immature Granulocytes: 1 %
Lymphocytes Relative: 26 %
Lymphs Abs: 3.1 10*3/uL (ref 0.7–4.0)
MCH: 27.1 pg (ref 26.0–34.0)
MCHC: 31.2 g/dL (ref 30.0–36.0)
MCV: 86.9 fL (ref 80.0–100.0)
Monocytes Absolute: 1.4 10*3/uL — ABNORMAL HIGH (ref 0.1–1.0)
Monocytes Relative: 11 %
Neutro Abs: 7.2 10*3/uL (ref 1.7–7.7)
Neutrophils Relative %: 60 %
Platelets: 365 10*3/uL (ref 150–400)
RBC: 4.98 MIL/uL (ref 3.87–5.11)
RDW: 15 % (ref 11.5–15.5)
WBC: 11.9 10*3/uL — ABNORMAL HIGH (ref 4.0–10.5)
nRBC: 0 % (ref 0.0–0.2)

## 2019-12-07 LAB — MAGNESIUM: Magnesium: 1.9 mg/dL (ref 1.7–2.4)

## 2019-12-07 LAB — GLUCOSE, CAPILLARY
Glucose-Capillary: 108 mg/dL — ABNORMAL HIGH (ref 70–99)
Glucose-Capillary: 85 mg/dL (ref 70–99)
Glucose-Capillary: 153 mg/dL — ABNORMAL HIGH (ref 70–99)
Glucose-Capillary: 153 mg/dL — ABNORMAL HIGH (ref 70–99)

## 2019-12-07 LAB — BASIC METABOLIC PANEL
Anion gap: 7 (ref 5–15)
BUN: 17 mg/dL (ref 6–20)
CO2: 27 mmol/L (ref 22–32)
Calcium: 8.9 mg/dL (ref 8.9–10.3)
Chloride: 102 mmol/L (ref 98–111)
Creatinine, Ser: 0.82 mg/dL (ref 0.44–1.00)
GFR calc Af Amer: >60 mL/min (ref >60)
GFR calc non Af Amer: >60 mL/min (ref >60)
Glucose, Bld: 170 mg/dL — ABNORMAL HIGH (ref 70–99)
Potassium: 3.6 mmol/L (ref 3.5–5.1)
Sodium: 136 mmol/L (ref 135–145)

## 2019-12-07 LAB — SURGICAL PCR SCREEN
MRSA, PCR: NEGATIVE
Staphylococcus aureus: NEGATIVE

## 2019-12-07 SURGERY — BIV ICD INSERTION CRT-D

## 2019-12-07 MED ORDER — LIDOCAINE HCL (PF) 1 % IJ SOLN
INTRAMUSCULAR | Status: DC | PRN
  Administered 2019-12-07: 60 mL

## 2019-12-07 MED ORDER — LIDOCAINE HCL 1 % IJ SOLN
INTRAMUSCULAR | Status: AC
  Filled 2019-12-07: qty 20

## 2019-12-07 MED ORDER — VANCOMYCIN HCL IN DEXTROSE 1-5 GM/200ML-% IV SOLN
1000.0000 mg | Freq: Two times a day (BID) | INTRAVENOUS | Status: AC
  Administered 2019-12-07: 1000 mg via INTRAVENOUS
  Filled 2019-12-07: qty 200

## 2019-12-07 MED ORDER — FENTANYL CITRATE (PF) 100 MCG/2ML IJ SOLN
INTRAMUSCULAR | Status: AC
  Filled 2019-12-07: qty 2

## 2019-12-07 MED ORDER — MIDAZOLAM HCL 5 MG/5ML IJ SOLN
INTRAMUSCULAR | Status: AC
  Filled 2019-12-07: qty 5

## 2019-12-07 MED ORDER — POTASSIUM CHLORIDE CRYS ER 20 MEQ PO TBCR
40.0000 meq | EXTENDED_RELEASE_TABLET | Freq: Once | ORAL | Status: AC
  Administered 2019-12-07: 40 meq via ORAL
  Filled 2019-12-07: qty 2

## 2019-12-07 MED ORDER — SODIUM CHLORIDE 0.9 % IV SOLN
INTRAVENOUS | Status: AC
  Filled 2019-12-07: qty 2

## 2019-12-07 MED ORDER — HEPARIN (PORCINE) IN NACL 1000-0.9 UT/500ML-% IV SOLN
INTRAVENOUS | Status: AC
  Filled 2019-12-07: qty 500

## 2019-12-07 MED ORDER — IOHEXOL 350 MG/ML SOLN
INTRAVENOUS | Status: DC | PRN
  Administered 2019-12-07: 10 mL via INTRAVENOUS

## 2019-12-07 MED ORDER — MIDAZOLAM HCL 5 MG/5ML IJ SOLN
INTRAMUSCULAR | Status: DC | PRN
  Administered 2019-12-07 (×6): 1 mg via INTRAVENOUS
  Administered 2019-12-07: 2 mg via INTRAVENOUS
  Administered 2019-12-07 (×2): 1 mg via INTRAVENOUS

## 2019-12-07 MED ORDER — FENTANYL CITRATE (PF) 100 MCG/2ML IJ SOLN
INTRAMUSCULAR | Status: DC | PRN
  Administered 2019-12-07: 12.5 ug via INTRAVENOUS
  Administered 2019-12-07 (×2): 25 ug via INTRAVENOUS
  Administered 2019-12-07: 12.5 ug via INTRAVENOUS

## 2019-12-07 MED ORDER — DAPAGLIFLOZIN PROPANEDIOL 10 MG PO TABS
10.0000 mg | ORAL_TABLET | Freq: Every day | ORAL | Status: DC
  Administered 2019-12-07 – 2019-12-08 (×2): 10 mg via ORAL
  Filled 2019-12-07 (×2): qty 1

## 2019-12-07 MED ORDER — INSULIN ASPART 100 UNIT/ML ~~LOC~~ SOLN
3.0000 [IU] | Freq: Three times a day (TID) | SUBCUTANEOUS | Status: DC
  Administered 2019-12-07 – 2019-12-08 (×2): 3 [IU] via SUBCUTANEOUS

## 2019-12-07 MED ORDER — ACETAMINOPHEN 325 MG PO TABS
325.0000 mg | ORAL_TABLET | ORAL | Status: DC | PRN
  Administered 2019-12-07 – 2019-12-08 (×3): 650 mg via ORAL
  Filled 2019-12-07 (×3): qty 2

## 2019-12-07 MED ORDER — VANCOMYCIN HCL IN DEXTROSE 1-5 GM/200ML-% IV SOLN
INTRAVENOUS | Status: AC
  Filled 2019-12-07: qty 200

## 2019-12-07 MED ORDER — HEPARIN (PORCINE) IN NACL 1000-0.9 UT/500ML-% IV SOLN
INTRAVENOUS | Status: DC | PRN
  Administered 2019-12-07: 500 mL

## 2019-12-07 MED ORDER — ONDANSETRON HCL 4 MG/2ML IJ SOLN: 4.0000 mg | Freq: Four times a day (QID) | INTRAMUSCULAR | Status: DC | PRN

## 2019-12-07 MED ORDER — MAGNESIUM SULFATE 2 GM/50ML IV SOLN
2.0000 g | Freq: Once | INTRAVENOUS | Status: AC
  Administered 2019-12-07: 2 g via INTRAVENOUS
  Filled 2019-12-07: qty 50

## 2019-12-07 MED FILL — Norepinephrine Bitartrate IV Soln 1 MG/ML (Base Equivalent): INTRAVENOUS | Qty: 4 | Status: AC

## 2019-12-07 MED FILL — Sodium Chloride IV Soln 0.9%: INTRAVENOUS | Qty: 250 | Status: AC

## 2019-12-07 SURGICAL SUPPLY — 7 items
CABLE SURGICAL S-101-97-12 (CABLE) ×3 IMPLANT
ICD CLARIA MRI DTMA1D1 (ICD Generator) ×2 IMPLANT
LEAD SPRINT QUAT SEC 6935-58CM (Lead) ×2 IMPLANT
PAD PRO RADIOLUCENT 2001M-C (PAD) ×3 IMPLANT
SHEATH 9FR PRELUDE SNAP 25 (SHEATH) ×2 IMPLANT
TRAY PACEMAKER INSERTION (PACKS) ×3 IMPLANT
WIRE HI TORQ VERSACORE-J 145CM (WIRE) ×2 IMPLANT

## 2019-12-07 NOTE — Interval H&P Note (Signed)
History and Physical Interval Note:  12/07/2019 7:41 AM  Lisa Poole  has presented today for surgery, with the diagnosis of vt.  The various methods of treatment have been discussed with the patient and family. After consideration of risks, benefits and other options for treatment, the patient has consented to  Procedure(s): BIV ICD INSERTION CRT-D (N/A) as a surgical intervention.  The patient's history has been reviewed, patient examined, no change in status, stable for surgery.  I have reviewed the patient's chart and labs.  Questions were answered to the patient's satisfaction.     Cristopher Peru

## 2019-12-07 NOTE — Progress Notes (Signed)
Inpatient Diabetes Program Recommendations  AACE/ADA: New Consensus Statement on Inpatient Glycemic Control (2015)  Target Ranges:  Prepandial:   less than 140 mg/dL      Peak postprandial:   less than 180 mg/dL (1-2 hours)      Critically ill patients:  140 - 180 mg/dL   Lab Results  Component Value Date   GLUCAP 108 (H) 12/07/2019   HGBA1C 7.2 (H) 11/28/2019    Review of Glycemic Control Results for Lisa Poole, Lisa Poole (MRN [id number]) as of 12/07/2019 10:30  Ref. Range 12/06/2019 06:35 12/06/2019 10:37 12/06/2019 11:31 12/06/2019 16:14 12/06/2019 21:18 12/07/2019 06:03  Glucose-Capillary Latest Ref Range: 70 - 99 mg/dL 93 184 (H) 181 (H) 292 (H) 217 (H) 108 (H)    Diabetes history: DM 2 Outpatient Diabetes medications:  Metformin 500 mg bid  Current orders for Inpatient glycemic control:  Novolog 0-9 units tid + hs Levemir 7 units bid PO prednisone Daily 40 mg Daily  Inpatient Diabetes Program Recommendations:   -  Consider adding Novolog meal coverage 3 units tid with meals (hold if patient eats less than 50%).   Thanks,  Tama Headings RN, MSN, BC-ADM Inpatient Diabetes Coordinator Team Pager [phone] (8a-5p)

## 2019-12-07 NOTE — Progress Notes (Signed)
Patient ID: Lisa Poole, female   DOB: Jul 07, 1965, 54 y.o.   MRN: [id number]     Advanced Heart Failure Rounding Note  PCP-Cardiologist: No primary care provider on file.   Subjective:    Doing well post-device upgrade.  Telemetry does not show any NSVT overnight.  She is now on mexiletine.  Denies dyspnea.   Device upgrade to MDT CRT-D.   CMRI: >>LVEF 30%. RVEF 27%. + patchy LGE in the basal septum, basal-mid inferoseptum/inferior wall, basal anterior wall, mid anterolateral wall. This could be consistent with cardiac sarcoidosis. Alternatively, could see similar pattern with prior myocarditis.  Objective:   Weight Range: 78.9 kg Body mass index is 33.98 kg/m.   Vital Signs:   Temp:  [97.9 F (36.6 C)-98.5 F (36.9 C)] 98.5 F (36.9 C) (07/29 0936) Pulse Rate:  [0-295] 85 (07/29 1033) Resp:  [8-24] 14 (07/29 0936) BP: (84-131)/(50-86) 95/73 (07/29 0936) SpO2:  [0 %-100 %] 96 % (07/29 0936) Weight:  [78.9 kg] 78.9 kg (07/29 0142) Last BM Date: 12/06/19  Weight change: Filed Weights   12/05/19 0500 12/06/19 0438 12/07/19 0142  Weight: 77.6 kg 77 kg 78.9 kg    Intake/Output:   Intake/Output Summary (Last 24 hours) at 12/07/2019 1042 Last data filed at 12/07/2019 3903 Gross per 24 hour  Intake 447.96 ml  Output 850 ml  Net -402.04 ml      Physical Exam    General: NAD Neck: No JVD, no thyromegaly or thyroid nodule.  Lungs: Clear to auscultation bilaterally with normal respiratory effort. CV: Nondisplaced PMI.  Heart regular S1/S2, no S3/S4, no murmur.  No peripheral edema.   Abdomen: Soft, nontender, no hepatosplenomegaly, no distention.  Skin: Intact without lesions or rashes.  Neurologic: Alert and oriented x 3.  Psych: Normal affect. Extremities: No clubbing or cyanosis.  HEENT: Normal.    Telemetry   NSR 80s with BiV pacing (personally reviewed)    Labs    CBC Recent Labs    12/06/19 0400 12/07/19 0413  WBC 14.6* 11.9*  NEUTROABS 8.6* 7.2    HGB 13.9 13.5  HCT 44.9 43.3  MCV 86.3 86.9  PLT 321 009   Basic Metabolic Panel Recent Labs    12/05/19 0543 12/05/19 0543 12/06/19 0400 12/07/19 0413  NA 137   < > 139 136  K 3.7   < > 4.0 3.6  CL 97*   < > 102 102  CO2 34*   < > 30 27  GLUCOSE 102*   < > 105* 170*  BUN 20   < > 14 17  CREATININE 0.83   < > 0.73 0.82  CALCIUM 9.0   < > 9.2 8.9  MG 2.0  --   --  1.9   < > = values in this interval not displayed.   Liver Function Tests No results for input(s): AST, ALT, ALKPHOS, BILITOT, PROT, ALBUMIN in the last 72 hours. No results for input(s): LIPASE, AMYLASE in the last 72 hours. Cardiac Enzymes No results for input(s): CKTOTAL, CKMB, CKMBINDEX, TROPONINI in the last 72 hours.  BNP: BNP (last 3 results) Recent Labs    11/26/19 2011  BNP 1,258.1*    ProBNP (last 3 results) No results for input(s): PROBNP in the last 8760 hours.   D-Dimer No results for input(s): DDIMER in the last 72 hours. Hemoglobin A1C No results for input(s): HGBA1C in the last 72 hours. Fasting Lipid Panel No results for input(s): CHOL, HDL, LDLCALC, TRIG, CHOLHDL, LDLDIRECT  in the last 72 hours. Thyroid Function Tests No results for input(s): TSH, T4TOTAL, T3FREE, THYROIDAB in the last 72 hours.  Invalid input(s): FREET3  Other results:   Imaging    CT Chest High Resolution  Result Date: 12/06/2019 CLINICAL DATA:  Sarcoid. EXAM: CT CHEST WITHOUT CONTRAST TECHNIQUE: Multidetector CT imaging of the chest was performed following the standard protocol without intravenous contrast. High resolution imaging of the lungs, as well as inspiratory and expiratory imaging, was performed. COMPARISON:  CT abdomen pelvis 12/04/2019 and CT chest 02/11/2019. FINDINGS: Cardiovascular: Heart size normal.  No pericardial effusion. Mediastinum/Nodes: 13 mm low-attenuation left thyroid nodule. No follow-up recommended (ref: J Am Coll Radiol. 2015 Feb;12(2): 143-50).Calcified mediastinal and hilar  lymph nodes. No pathologically enlarged mediastinal or axillary lymph nodes. Hilar regions are otherwise difficult to definitively evaluate without IV contrast. Esophagus is unremarkable. Lungs/Pleura: No perilymphatic pulmonary parenchymal nodularity. There are areas of peribronchovascular ground-glass in the lower lobes, right greater than left. Negative for subpleural reticulation, traction bronchiectasis/bronchiolectasis, architectural distortion or honeycombing. No pleural fluid. Airway is unremarkable. No air trapping. Upper Abdomen: Visualized portions of the liver, adrenal glands, kidneys, spleen, pancreas, stomach and bowel are grossly unremarkable. Cholecystectomy. Calcified upper abdominal lymph nodes. Musculoskeletal: Degenerative changes in the spine. No worrisome lytic or sclerotic lesions. IMPRESSION: Areas of peribronchovascular ground-glass in the lower lobes are likely infectious or inflammatory in etiology. No evidence of pulmonary parenchymal sarcoid. Findings are suggestive of an alternative diagnosis (not UIP) per consensus guidelines: Diagnosis of Idiopathic Pulmonary Fibrosis: An Official ATS/ERS/JRS/ALAT Clinical Practice Guideline. [place], Iss 5, [phone], Jan 09 2017. Electronically Signed   By: Lorin Picket M.D.   On: 12/06/2019 14:03   EP PPM/ICD IMPLANT  Result Date: 12/07/2019 Conclusion: Successful upgrade of a dual-chamber pacemaker to a biventricular ICD in a patient with complete heart block and sustained monomorphic ventricular tachycardia in the setting of an ejection fraction of 35%, thought secondary to cardiac sarcoidosis. Cristopher Peru, MD    Medications:     Scheduled Medications:  aspirin EC  81 mg Oral Daily   carvedilol  3.125 mg Oral BID WC   chlorhexidine gluconate (MEDLINE KIT)  15 mL Mouth Rinse BID   Chlorhexidine Gluconate Cloth  6 each Topical Daily   dapagliflozin propanediol  10 mg Oral Daily   digoxin  0.125 mg  Oral Daily   docusate sodium  100 mg Oral BID   feeding supplement (GLUCERNA SHAKE)  237 mL Oral BID BM   gabapentin  600 mg Oral BID   insulin aspart  0-5 Units Subcutaneous QHS   insulin aspart  0-9 Units Subcutaneous TID WC   insulin aspart  3 Units Subcutaneous TID WC   insulin detemir  7 Units Subcutaneous BID   mexiletine  150 mg Oral Q12H   multivitamin with minerals  1 tablet Oral Daily   pantoprazole  40 mg Oral Daily   polyethylene glycol  17 g Oral Daily   potassium chloride  40 mEq Oral Once   pravastatin  10 mg Oral q1800   predniSONE  40 mg Oral Q breakfast   sacubitril-valsartan  1 tablet Oral BID   sodium chloride flush  10-40 mL Intracatheter Q12H   sodium chloride flush  3 mL Intravenous Q12H   sodium chloride flush  3 mL Intravenous Q12H   spironolactone  25 mg Oral Daily    Infusions:  sodium chloride Stopped (12/04/19 1707)   magnesium sulfate bolus  IVPB     vancomycin      PRN Medications: Place/Maintain arterial line **AND** sodium chloride, acetaminophen, ipratropium-albuterol, ondansetron (ZOFRAN) IV, sodium chloride flush    Assessment/Plan   1. Acute systolic CHF: Nonischemic cardiomyopathy.  Echo this admission with EF newly decreased to 20-25% with septal-lateral dyssynchrony and septal severe hypokinesis, normal RV, moderate TR, moderate MR. Echo in 5/20 prior to PPM placement showed EF 60-65%.  Possible causes of cardiomyopathy include chronic RV pacing, frequent NSVT/PVCs, ?CAD (has RFs), ?viral myocarditis (relatively recent COVID-19 infection).  No ETOH/drugs.  She has had increased dyspnea/palpitations x several weeks. Volume overloaded and CXR suggested pulmonary edema at time of admit. Developed CGS overnight 7/20 w/ hypotension and narrow pulse pressure, w/ subsequent AKI, SCr 1.18>>1.85>>2.26. Lactic acid increased from 2.1>>5.0. Diuresis subsequently limited. She was initially on dopamine, then norepinephrine, milrinone,  and back to norepinephrine.  Least ventricular ectopy on norepinephrine.  LHC/RHC 7/23 with no significant CAD, elevated filling pressures, and low cardiac output on norepinephrine. IABP placed. IABP removed 7/26. Off pressors. cMRI 7/27 showed patchy LGE in the basal septum, basal-mid inferoseptum/inferior wall, basal anterior wall, mid anterolateral wall. Cardiac MRI LGE pattern could be consistent with cardiac sarcoidosis (would explain earlier CHB).  Alternatively, prior viral myocarditis (recent COVID-19).  CT chest with ground glass lower lobes but no evidence for pulmonary sarcoidosis.  She is now s/p device upgrade to MDT CRT-D.  Not volume overloaded on exam.  - Continue to hold loop diuretics today.  - Continue digoxin 0.125 daily. Dig level ok - Continue spironolactone 25 mg daily  - Continue Entresto 49/51 mg bid - Continue Coreg 3.125 mg bid  - Add dapagliflozin 10 mg daily.  - Solumedrol started 7/24 for presumed sarcoid => now on prednisone 40 mg daily.  - Will need [facility] for cardiac sarcoidosis.  - Cardiomyopathy likely worsened by frequent VT/PVCs. Now on mexiletine to suppress.  2. Wide complex tachycardia: VT with multiple morphologies.  Increased ventricular arrhythmias over 6 months.  ?Underlying cause.  No significant coronary disease.  Consider infiltrative disease like sarcoidosis.  Increased VT runs with milrinone and dopamine, off both, did better with norepinephrine. IABP implanted 7/23=>removed 7/26.   Torsades/VT x 3 on 7/24 with prolonged QT required DC-CV. Amio switched to lido. Lido stopped 7/26 by Dr. Caryl Comes. Now on mexiletine, minimal ectopy overnight.  - Keep K > 4.0 Mg > 2.0 - Solumedrol started 7/24 for presumed cardiac sarcoid => transitioned to prednisone 40 mg daily.   - She is now s/p ICD upgrade.  - Continue mexiletine.   3. H/o CHB: Cause is uncertain.  Cath in 5/20 prior to PPM showed no significant disease. Possible cardiac sarcoidosis by  cMRI but no evidence for pulmonary sarcoidosis. Needs cardiac PET scan for further evaluation for sarcoidosis.  4. AKI: Resolved.   5. Right Groin Hematoma: Developed 7/26 at IABP site. Femstop placed.  Noncontrast abd/pelvic CT showed moderate right groin hematoma but no retroperitoneal bleed. Resolved.   Home soon, possibly tomorrow.    Length of Stay: [address]  12/07/2019 10:42 AM

## 2019-12-07 NOTE — Progress Notes (Signed)
Pt stable in bed. Dressing intact, clean and dry. No bruising or bleeding noted. No pain at this time. Pt is awake, oriented, and alert. Son at the bedside. Will continue to monitor.

## 2019-12-07 NOTE — Progress Notes (Signed)
Patient has returned to 3E02 from cath lab. PPM site WNL; not bruising or bleeding through dressing. Pt is drowsy but does respond to voice and is alert and oriented. Vitals obtained and WNL. Husband notified of her return to room.

## 2019-12-08 ENCOUNTER — Encounter (HOSPITAL_COMMUNITY): Payer: Self-pay | Admitting: Internal Medicine

## 2019-12-08 ENCOUNTER — Inpatient Hospital Stay (HOSPITAL_COMMUNITY): Payer: Medicare Other

## 2019-12-08 LAB — CBC WITH DIFFERENTIAL/PLATELET
Abs Immature Granulocytes: 0.05 10*3/uL (ref 0.00–0.07)
Basophils Absolute: 0.1 10*3/uL (ref 0.0–0.1)
Basophils Relative: 1 %
Eosinophils Absolute: 0.3 10*3/uL (ref 0.0–0.5)
Eosinophils Relative: 2 %
HCT: 42.8 % (ref 36.0–46.0)
Hemoglobin: 13.3 g/dL (ref 12.0–15.0)
Immature Granulocytes: 0 %
Lymphocytes Relative: 27 %
Lymphs Abs: 3 10*3/uL (ref 0.7–4.0)
MCH: 26.9 pg (ref 26.0–34.0)
MCHC: 31.1 g/dL (ref 30.0–36.0)
MCV: 86.5 fL (ref 80.0–100.0)
Monocytes Absolute: 1.2 10*3/uL — ABNORMAL HIGH (ref 0.1–1.0)
Monocytes Relative: 10 %
Neutro Abs: 6.6 10*3/uL (ref 1.7–7.7)
Neutrophils Relative %: 60 %
Platelets: 346 10*3/uL (ref 150–400)
RBC: 4.95 MIL/uL (ref 3.87–5.11)
RDW: 15.3 % (ref 11.5–15.5)
WBC: 11.1 10*3/uL — ABNORMAL HIGH (ref 4.0–10.5)
nRBC: 0 % (ref 0.0–0.2)

## 2019-12-08 LAB — BASIC METABOLIC PANEL
Anion gap: 9 (ref 5–15)
BUN: 17 mg/dL (ref 6–20)
CO2: 24 mmol/L (ref 22–32)
Calcium: 8.8 mg/dL — ABNORMAL LOW (ref 8.9–10.3)
Chloride: 104 mmol/L (ref 98–111)
Creatinine, Ser: 0.81 mg/dL (ref 0.44–1.00)
GFR calc Af Amer: >60 mL/min (ref >60)
GFR calc non Af Amer: >60 mL/min (ref >60)
Glucose, Bld: 93 mg/dL (ref 70–99)
Potassium: 4.3 mmol/L (ref 3.5–5.1)
Sodium: 137 mmol/L (ref 135–145)

## 2019-12-08 LAB — GLUCOSE, CAPILLARY: Glucose-Capillary: 114 mg/dL — ABNORMAL HIGH (ref 70–99)

## 2019-12-08 MED ORDER — PREDNISONE 20 MG PO TABS: 20.0000 mg | ORAL_TABLET | Freq: Every day | ORAL | 1 refills | Status: DC

## 2019-12-08 MED ORDER — PREDNISONE 20 MG PO TABS: 20.0000 mg | ORAL_TABLET | Freq: Every day | ORAL | Status: DC

## 2019-12-08 MED ORDER — PRAVASTATIN SODIUM 10 MG PO TABS: 10.0000 mg | ORAL_TABLET | Freq: Every day | ORAL | 5 refills | Status: DC

## 2019-12-08 MED ORDER — DAPAGLIFLOZIN PROPANEDIOL 10 MG PO TABS: 10.0000 mg | ORAL_TABLET | Freq: Every day | ORAL | 6 refills | Status: DC

## 2019-12-08 MED ORDER — CARVEDILOL 3.125 MG PO TABS: 3.1250 mg | ORAL_TABLET | Freq: Two times a day (BID) | ORAL | 6 refills | Status: DC

## 2019-12-08 MED ORDER — MEXILETINE HCL 150 MG PO CAPS: 150.0000 mg | ORAL_CAPSULE | Freq: Two times a day (BID) | ORAL | 6 refills | Status: DC

## 2019-12-08 MED ORDER — SACUBITRIL-VALSARTAN 49-51 MG PO TABS: 1.0000 | ORAL_TABLET | Freq: Two times a day (BID) | ORAL | 6 refills | Status: DC

## 2019-12-08 MED ORDER — SPIRONOLACTONE 25 MG PO TABS: 25.0000 mg | ORAL_TABLET | Freq: Every day | ORAL | 6 refills | Status: DC

## 2019-12-08 MED ORDER — PREDNISONE 20 MG PO TABS: 40.0000 mg | ORAL_TABLET | Freq: Every day | ORAL | Status: DC

## 2019-12-08 MED ORDER — DIGOXIN 125 MCG PO TABS: 0.1250 mg | ORAL_TABLET | Freq: Every day | ORAL | 6 refills | Status: DC

## 2019-12-08 MED FILL — predniSONE 20 MG TABS: 20 | 20 days supply | Qty: 30 | Fill #0 | Status: TO

## 2019-12-08 MED FILL — MEXILETINE 150 MG CAPSULE: 150 | 30 days supply | Qty: 60 | Fill #0 | Status: TO

## 2019-12-08 MED FILL — ENTRESTO 49 MG-51 MG TABLET: 49-51 | 30 days supply | Qty: 60 | Fill #0 | Status: TO

## 2019-12-08 MED FILL — FARXIGA 10 MG TABLET: 10 | 30 days supply | Qty: 30 | Fill #0 | Status: TO

## 2019-12-08 MED FILL — CARVEDILOL 3.125 MG TABLET: 3.125 | 30 days supply | Qty: 60 | Fill #0 | Status: TO

## 2019-12-08 MED FILL — PRAVASTATIN NA 10 MG TAB: 10 | 30 days supply | Qty: 30 | Fill #0 | Status: TO

## 2019-12-08 MED FILL — DIGOXIN 0.125 MG TABLET: 125 | 30 days supply | Qty: 30 | Fill #0 | Status: TO

## 2019-12-08 MED FILL — SPIRONOLACTONE 25 MG TABLET: 25 | 30 days supply | Qty: 30 | Fill #0 | Status: TO

## 2019-12-08 MED FILL — Lidocaine HCl Local Inj 1%: INTRAMUSCULAR | Qty: 60 | Status: AC

## 2019-12-08 NOTE — Discharge Summary (Signed)
Advanced Heart Failure Team  Discharge Summary   Patient ID: Lisa Poole MRN: [id number], DOB/AGE: 01-27-1966 54 y.o. Admit date: 11/26/2019 D/C date:     12/08/2019   Primary Discharge Diagnoses:  Acute Systolic Heart Failure Cardiogenic Shock  Ventricular Tachycardia S/p CRT-D H/o COVID 19 infection   Hospital Course:   54 y/o female w/ h/o HTN, HLD, T2DM, CHB s/p PPM implant 02/2019 followed by Dr. Lovena Le, PAF (not on a/c), aortic insufficiency and recent COVID infection in April 2021.   In May 2020, she underwent w/u for SOB and mildly elevated troponin I  (0.09>>0.10>>0.12>>0.15). LHC 09/2018 showed no significant coronary disease.There was minimal systolic bridging of 5 to 10% in the mid LAD. The left circumflex vessel wss normal. The RCA is a dominant vessel with a superior takeoff and was normal. Echo 09/2018. LVEF 60-65%, mild LVH, RV normal. Mild- mod AI. No stenosis.   In 02/2019, she presented w/ chest pain and found to be in CHB and underwent PPM inplant ([employer]). She is followed by Dr. Lovena Le. EP notes outline detection of PAF but episodes have been very brief, thus she has not bee on anticoagulation.   In April 2021, she was diagnosed w/ COVID but did not require hospitalization. She has not been vaccinated.   She presented to the [facility] on 7/18 w/ complaint of progressive dyspnea, intermittent CP, orthopnea, PND and LEE. Symptoms present for several weeks but worsened over the last 2 days, progressing to symptoms at rest.   In ED, found to be in acute CHF. BNP 1,258 . CXR w/ developing edema. HS troponin 32>>52>>67. EKG and tele w/ WCT, felt to be VT. Rates in the 160s-170s. BP has been soft but stable. Started on IV amiodarone in ED. Initial K 4.2>>3.2 today. Mg 1.7. SCr 1.37>>1.18. started on lasix gtt.   Echo was repeated and showed new drop in LVEF, down to 20-25% w/ regional wall motion abnormalties,  most prominent in anterior, septal, and lateral walls.  Moderate to severe TR with elevated RVSP. Moderate MR. LV thrombus excluded by definity contrast. RV systolic function normal.   She developed worsening HF/ cardiogenic shock w/ low Co-ox in the 40s, required inotropic support + placement of IABP. IV amiodarone for VT suppression. Had [place] that showed no significant coronary disease. cMRI c/w possible cardiac sarcoidosis vs prior myocarditis. She was placed on empiric prednisone for possible sarcoid.   She was ultimately weaned off intropes and IABP. Rhythm stabilized w/o further recurrence. On Mexiletine for maintenance therapy. She underwent device upgrade to CRT-D.   On 7/30, she was seen and examined by Dr. Aundra Dubin and felt stable for discharge home.   She will f/u in [facility] in 1 week. She will be referred to [facility] for PET scan to further assess for cardiac sarcoidosis. EP f/u also arranged.   See Detailed Hospital Problem List Below  1. Acute systolic CHF: Nonischemic cardiomyopathy.  Echo this admission with EF newly decreased to 20-25% with septal-lateral dyssynchrony and septal severe hypokinesis, normal RV, moderate TR, moderate MR. Echo in 5/20 prior to PPM placement showed EF 60-65%. Possible causes of cardiomyopathy include chronic RV pacing, frequent NSVT/PVCs, ?CAD (has RFs), ?viral myocarditis (relatively recent COVID-19 infection). No ETOH/drugs. She has had increased dyspnea/palpitations x several weeks. Volume overloaded and CXR suggested pulmonary edema at time of admit. Developed CGS overnight 7/20 w/ hypotension and narrow pulse pressure, w/ subsequent AKI, SCr 1.18>>1.85>>2.26. Lactic acid increased from 2.1>>5.0. Diuresis subsequently limited. She was initially  on dopamine, then norepinephrine, milrinone, and back to norepinephrine.  Least ventricular ectopy on norepinephrine.  LHC/RHC 7/23 with no significant CAD, elevated filling pressures, and low cardiac output on norepinephrine. IABP placed. IABP removed 7/26. Off pressors. cMRI  7/27 showed patchy LGE in the basal septum, basal-mid inferoseptum/inferior wall, basal anterior wall, mid anterolateral wall. Cardiac MRI LGE pattern could be consistent with cardiac sarcoidosis (would explain earlier CHB).  Alternatively, prior viral myocarditis (recent COVID-19).  CT chest with ground glass lower lobes but no evidence for pulmonary sarcoidosis.  She is now s/p device upgrade to MDT CRT-D.  Not volume overloaded on exam.  - I do not think that she will need a loop diuretic at home.  - Continue digoxin 0.125 daily. Dig level ok - Continue spironolactone 25 mg daily  - Continue Entresto 49/51 mg bid - Continue Coreg 3.125 mg bid  - Continue dapagliflozin 10 mg daily.  - Solumedrol started 7/24 for presumed sarcoid => now on prednisone 40 mg daily.  - Will need PET scan done at [facility] or [facility] ASAP for cardiac sarcoidosis => if not suggestive, will stop prednisone.  - Cardiomyopathy likely worsened by frequent VT/PVCs. Now on mexiletine to suppress.  2. Wide complex tachycardia: VT with multiple morphologies.  Increased ventricular arrhythmias over 6 months.  ?Underlying cause.  No significant coronary disease.  Consider infiltrative disease like sarcoidosis.  Increased VT runs with milrinone and dopamine, off both, did better with norepinephrine. IABP implanted 7/23=>removed 7/26.   Torsades/VT x 3 on 7/24 with prolonged QT required DC-CV. Amio switched to lido. Lido stopped 7/26 by Dr. Caryl Comes. Now on mexiletine, minimal ectopy overnight.  - Keep K > 4.0 Mg > 2.0 - Solumedrol started 7/24 for presumed cardiac sarcoid => transitioned to prednisone 40 mg daily.   - She is now s/p ICD upgrade.  - Continue mexiletine.   3. H/o CHB: Cause is uncertain. Cath in 5/20 prior to PPM showed no significant disease. Possible cardiac sarcoidosis by cMRI but no evidence for pulmonary sarcoidosis. Needs cardiac PET scan for further evaluation for sarcoidosis, will try to get this asap.  4. AKI:  Resolved.   5. Right Groin Hematoma: Developed 7/26 at IABP site. Femstop placed.  Noncontrast abd/pelvic CT showed moderate right groin hematoma but no retroperitoneal bleed. Resolved.  6. Disposition: Home today.  Followup in CHF clinic within the next 2 wks.  Followup with Dr. Lovena Le.  Need to schedule cardiac PET to assess for cardiac sarcoidosis at [facility] or [facility] some time in the next month ideally.  Cardiac meds for discharge: dapagliflozin 10 mg daily (or Jardiance if insurance prefers), Entresto 49/51 bid, Coreg 3.125 mg bid, spironolactone 25 daily, metformin 500 mg bid, digoxin 0.125, ASA 81, mexiletine 150 bid, pravastatin 10 daily, prednisone 40 mg daily for 5 more days then 20 mg daily until she has her cardiac PET.   Discharge Weight Range: 77 kg  Discharge Vitals: Blood pressure 98/77, pulse 94, temperature 97.9 F (36.6 C), temperature source Oral, resp. rate 17, height 5' (1.524 m), weight 78.6 kg, last menstrual period 07/25/2015, SpO2 96 %.  Labs: Lab Results  Component Value Date   WBC 11.1 (H) 12/08/2019   HGB 13.3 12/08/2019   HCT 42.8 12/08/2019   MCV 86.5 12/08/2019   PLT 346 12/08/2019    Recent Labs  Lab 12/08/19 0055  NA 137  K 4.3  CL 104  CO2 24  BUN 17  CREATININE 0.81  CALCIUM 8.8*  GLUCOSE 93   Lab Results  Component Value Date   CHOL 178 09/28/2018   HDL 71 09/28/2018   LDLCALC 89 09/28/2018   TRIG 91 09/28/2018   BNP (last 3 results) Recent Labs    11/26/19 2011  BNP 1,258.1*    ProBNP (last 3 results) No results for input(s): PROBNP in the last 8760 hours.   Diagnostic Studies/Procedures   DG Chest 2 View  Result Date: 12/08/2019 CLINICAL DATA:  AICD. EXAM: CHEST - 2 VIEW COMPARISON:  CT chest 12/06/2019. FINDINGS: AICD noted stable position. Heart size normal. Improved aeration both lungs. No focal infiltrate. No pleural effusion or pneumothorax. Degenerative changes scoliosis thoracic spine. IMPRESSION: 1.  AICD in stable position.   Heart size normal. 2.  Improved aeration both lungs.  No acute infiltrate. Electronically Signed   By: Marcello Moores  Register   On: 12/08/2019 06:56   EP PPM/ICD IMPLANT  Result Date: 12/07/2019 Conclusion: Successful upgrade of a dual-chamber pacemaker to a biventricular ICD in a patient with complete heart block and sustained monomorphic ventricular tachycardia in the setting of an ejection fraction of 35%, thought secondary to cardiac sarcoidosis. Cristopher Peru, MD   Discharge Medications   Allergies as of 12/08/2019      Reactions   Amiodarone Other (See Comments)   Torsades on amiodarone    Lisinopril Other (See Comments), Cough   Flu like symptoms, also   Ace Inhibitors Other (See Comments)   Flu-like symptoms   Augmentin [amoxicillin-pot Clavulanate] Nausea And Vomiting   Benazepril Other (See Comments)   Flu like symptoms   Lotensin [benazepril Hcl] Other (See Comments)   Flu like symptoms   Oxycodone-acetaminophen Nausea And Vomiting   Sulfa Antibiotics Nausea And Vomiting      Medication List    STOP taking these medications   acetaminophen 325 MG tablet Commonly known as: TYLENOL   hydrochlorothiazide 25 MG tablet Commonly known as: HYDRODIURIL   losartan 25 MG tablet Commonly known as: COZAAR   lovastatin 10 MG tablet Commonly known as: MEVACOR Replaced by: pravastatin 10 MG tablet   metoprolol tartrate 25 MG tablet Commonly known as: LOPRESSOR     TAKE these medications   albuterol 108 (90 Base) MCG/ACT inhaler Commonly known as: VENTOLIN HFA Inhale 1-2 puffs into the lungs every 6 (six) hours as needed for wheezing or shortness of breath.   aspirin EC 81 MG tablet Take 81 mg by mouth daily. Swallow whole.   Biotin 10 MG Caps Take 10 mg by mouth daily.   carvedilol 3.125 MG tablet Commonly known as: COREG Take 1 tablet (3.125 mg total) by mouth 2 (two) times daily with a meal.   Centrum Silver 50+Women Tabs Take 1 tablet by mouth daily.    cetirizine 10 MG tablet Commonly known as: ZYRTEC Take 10 mg by mouth daily.   dapagliflozin propanediol 10 MG Tabs tablet Commonly known as: FARXIGA Take 1 tablet (10 mg total) by mouth daily.   digoxin 0.125 MG tablet Commonly known as: LANOXIN Take 1 tablet (0.125 mg total) by mouth daily. Start taking on: December 09, 2019   gabapentin 600 MG tablet Commonly known as: NEURONTIN Take 600 mg by mouth 2 (two) times daily.   HYDROcodone-acetaminophen 10-325 MG tablet Commonly known as: NORCO Take 1 tablet by mouth 4 (four) times daily.   meloxicam 15 MG tablet Commonly known as: MOBIC Take 15 mg by mouth daily.   metFORMIN 500 MG tablet Commonly known as: GLUCOPHAGE Take 500 mg  by mouth 2 (two) times daily.   mexiletine 150 MG capsule Commonly known as: MEXITIL Take 1 capsule (150 mg total) by mouth every 12 (twelve) hours.   Narcan 4 MG/0.1ML Liqd nasal spray kit Generic drug: naloxone Place 1 spray into the nose as directed.   omeprazole 40 MG capsule Commonly known as: PRILOSEC Take 40 mg by mouth daily.   ondansetron 4 MG disintegrating tablet Commonly known as: Zofran ODT Take 1 tablet (4 mg total) by mouth every 8 (eight) hours as needed for nausea or vomiting.   pravastatin 10 MG tablet Commonly known as: PRAVACHOL Take 1 tablet (10 mg total) by mouth daily at 6 PM. Replaces: lovastatin 10 MG tablet   predniSONE 20 MG tablet Commonly known as: DELTASONE Take 1-2 tablets (20-40 mg total) by mouth daily with breakfast. Take 2 tablets (22m) by mouth daily for the next 5 days then take 1 tablet (280m [facility]: [facility]   sacubitril-valsartan 49-51 MG Commonly known as: ENTRESTO Take 1 tablet by mouth 2 (two) times daily.   spironolactone 25 MG tablet Commonly known as: ALDACTONE Take 1 tablet (25 mg total) by mouth daily. Start taking on: December 09, 2019   venlafaxine XR 150 MG 24 hr capsule Commonly  known as: EFFEXOR-XR Take 150 mg by mouth daily.   Vitamin D-3 25 MCG (1000 UT) Caps Take 1,000 Units by mouth daily.       Disposition   The patient will be discharged in stable condition to home. Discharge Instructions    Amb Referral to Cardiac Rehabilitation   Complete by: As directed    Diagnosis: Heart Failure (see criteria below if ordering Phase II)   Heart Failure Type: Chronic Systolic & Diastolic   After initial evaluation and assessments completed: Virtual Based Care may be provided alone or in conjunction with Phase 2 Cardiac Rehab based on patient barriers.: Yes      Follow-up Information    [facility] Follow up.   Specialty: Cardiology Why: 12/19/2019 @ 9:00AM, wound check visit Contact information: [address]     TaEvans LanceMD Follow up.   Specialty: Cardiology Why: 03/11/2020 @ 2:30PM Contact information: [id number]. [phone]        UrBaldwin JamaicaPA-C Follow up.   Specialty: Cardiology Why: 12/28/2019 @ 11:45AM Contact information: [phone]        CaMilford CagePA On 12/12/2019.   Specialty: Physician Assistant Why: _0 :00Max Sanenformation: [phone]        McLarey DresserMD Follow up on 12/15/2019.   Specialty: Cardiology Why: 9:40 AM Advanced Heart Failure Clinic Contact information: [phone]               Duration of Discharge Encounter: Greater than 35 minutes   Signed, BrNelida Gores7/30/2021, 4:25 PM

## 2019-12-08 NOTE — Progress Notes (Signed)
Large hematoma to pelvic area to from hip to hip. Pt stated it was from the balloon pump, appears to be healing. Pt ready for discharge home home today. Awaiting orders.

## 2019-12-08 NOTE — Plan of Care (Signed)
  Problem: Clinical Measurements: Goal: Ability to maintain clinical measurements within normal limits will improve Outcome: Progressing Goal: Will remain free from infection Outcome: Progressing Goal: Cardiovascular complication will be avoided Outcome: Progressing   Problem: Activity: Goal: Risk for activity intolerance will decrease Outcome: Progressing   Problem: Nutrition: Goal: Adequate nutrition will be maintained Outcome: Progressing   Problem: Pain Managment: Goal: General experience of comfort will improve Outcome: Progressing   Problem: Safety: Goal: Ability to remain free from injury will improve Outcome: Progressing

## 2019-12-08 NOTE — Progress Notes (Signed)
Chaplain engaged in follow-up visit with [name].  She is getting ready to discharge and expressed her happiness about being able to go home.  Chaplain celebrated the journey that she has been on and recognized the journey she still has ahead. [name] noted that she has to make some lifestyle changes and the recovery process she will be on outside of the hospital concerning not being able to drive for a certain timeframe, changing eating habits, and future tests/scans.  Chaplain affirmed what [name] has endured and offered support, offering the ministries of presence and listening.

## 2019-12-08 NOTE — Progress Notes (Addendum)
Progress Note  Patient Name: Lisa Poole Date of Encounter: 12/08/2019  [facility] Cardiologist: Dr. Lovena Le  Subjective   Feels OK, no CP or SOB, minimal site discomfort  Inpatient Medications    Scheduled Meds:  aspirin EC  81 mg Oral Daily   carvedilol  3.125 mg Oral BID WC   chlorhexidine gluconate (MEDLINE KIT)  15 mL Mouth Rinse BID   Chlorhexidine Gluconate Cloth  6 each Topical Daily   dapagliflozin propanediol  10 mg Oral Daily   digoxin  0.125 mg Oral Daily   docusate sodium  100 mg Oral BID   feeding supplement (GLUCERNA SHAKE)  237 mL Oral BID BM   gabapentin  600 mg Oral BID   insulin aspart  0-5 Units Subcutaneous QHS   insulin aspart  0-9 Units Subcutaneous TID WC   insulin aspart  3 Units Subcutaneous TID WC   insulin detemir  7 Units Subcutaneous BID   mexiletine  150 mg Oral Q12H   multivitamin with minerals  1 tablet Oral Daily   pantoprazole  40 mg Oral Daily   polyethylene glycol  17 g Oral Daily   pravastatin  10 mg Oral q1800   predniSONE  40 mg Oral Q breakfast   sacubitril-valsartan  1 tablet Oral BID   sodium chloride flush  10-40 mL Intracatheter Q12H   sodium chloride flush  3 mL Intravenous Q12H   sodium chloride flush  3 mL Intravenous Q12H   spironolactone  25 mg Oral Daily   Continuous Infusions:  sodium chloride Stopped (12/04/19 1707)   PRN Meds: Place/Maintain arterial line **AND** sodium chloride, acetaminophen, ipratropium-albuterol, ondansetron (ZOFRAN) IV, sodium chloride flush   Vital Signs    Vitals:   12/07/19 2023 12/08/19 0011 12/08/19 0414 12/08/19 0810  BP: 104/72 125/80 106/78 98/77  Pulse: 89 85 88 94  Resp: 20 20 20 17   Temp: 98.2 F (36.8 C) 98 F (36.7 C) 98.2 F (36.8 C) 97.9 F (36.6 C)  TempSrc: Oral Oral Oral Oral  SpO2: 99% 99% 97% 96%  Weight:  78.6 kg    Height:        Intake/Output Summary (Last 24 hours) at 12/08/2019 0812 Last data filed at 12/08/2019 0811 Gross per 24 hour  Intake  843 ml  Output 2700 ml  Net -1857 ml   Last 3 Weights 12/08/2019 12/07/2019 12/06/2019  Weight (lbs) 173 lb 4.8 oz 174 lb 169 lb 12.1 oz  Weight (kg) 78.608 kg 78.926 kg 77 kg      Telemetry    SR/Vpacing - Personally Reviewed  ECG    SR, V pacing - Personally Reviewed  Physical Exam   GEN: No acute distress.   Neck: No JVD Cardiac: RRR, no murmurs, rubs, or gallops.  Respiratory: CTA b/l  GI: Soft, nontender, non-distended  MS: No edema; No deformity. Neuro:  Nonfocal  Psych: Normal affect   L chest: ICD site is stable, clean, dry.  No hematoma, ecchymosis   Labs    High Sensitivity Troponin:   Recent Labs  Lab 11/26/19 2012 11/26/19 2231 11/27/19 0402  TROPONINIHS 32* 52* 67*      Chemistry Recent Labs  Lab 12/06/19 0400 12/07/19 0413 12/08/19 0055  NA 139 136 137  K 4.0 3.6 4.3  CL 102 102 104  CO2 30 27 24   GLUCOSE 105* 170* 93  BUN 14 17 17   CREATININE 0.73 0.82 0.81  CALCIUM 9.2 8.9 8.8*  GFRNONAA >60 >60 >60  GFRAA >60 >60 >60  ANIONGAP 7 7 9      Hematology Recent Labs  Lab 12/06/19 0400 12/07/19 0413 12/08/19 0055  WBC 14.6* 11.9* 11.1*  RBC 5.20* 4.98 4.95  HGB 13.9 13.5 13.3  HCT 44.9 43.3 42.8  MCV 86.3 86.9 86.5  MCH 26.7 27.1 26.9  MCHC 31.0 31.2 31.1  RDW 15.0 15.0 15.3  PLT 321 365 346    BNPNo results for input(s): BNP, PROBNP in the last 168 hours.   DDimer No results for input(s): DDIMER in the last 168 hours.   Radiology    12/08/2019: CXR IMPRESSION: 1.  AICD in stable position.  Heart size normal. 2.  Improved aeration both lungs.  No acute infiltrate.   12/06/2019; High res CT chest IMPRESSION: Areas of peribronchovascular ground-glass in the lower lobes are likely infectious or inflammatory in etiology. No evidence of pulmonary parenchymal sarcoid. Findings are suggestive of an alternative diagnosis (not UIP) per consensus guidelines: Diagnosis of Idiopathic Pulmonary Fibrosis: An Official  ATS/ERS/JRS/ALAT Clinical Practice Guideline. [place], Iss 5, [id number], Jan 09 2017.   Cardiac Studies   12/05/2019: c.MRI IMPRESSION: 1. Normal LV size with multiple wall motion abnormalities. The septum was relatively thin with severe hypokinesis and septal-lateral dyssynchrony, the anterior and inferior walls were hypokinetic. LV EF 30%.   2. Normal RV size with moderate to severely decreased systolic function, EF 68%. Pacemaker in RV.   3. Non-coronary LGE pattern. Patchy LGE in the basal septum, basal-mid inferoseptum/inferior wall, basal anterior wall, mid anterolateral wall. This could be consistent with cardiac sarcoidosis. Alternatively, could see similar pattern with prior myocarditis.   Loralie Champagne   12/01/2019 R/LHC 1. Elevated right and left heart filling pressures.  2. Low cardiac output on norepinephrine 3.  3. Mixed pulmonary venous/pulmonary arterial hypertension.  4. No significant coronary disease.  5. Successful IABP placement.      Echo 11/27/19 1. Left ventricular ejection fraction, by estimation, is 20 to 25%. The  left ventricle has severely decreased function. The left ventricle  demonstrates regional wall motion abnormalities (see scoring  diagram/findings for description). The left  ventricular internal cavity size was mildly dilated. Left ventricular  diastolic parameters are indeterminate.   2. Right ventricular systolic function is normal. The right ventricular  size is mildly enlarged. There is moderately elevated pulmonary artery  systolic pressure. The estimated right ventricular systolic pressure is  03.2 mmHg.   3. Left atrial size was mildly dilated.   4. Right atrial size was moderately dilated.   5. The mitral valve is normal in structure. Moderate mitral valve  regurgitation.   6. Tricuspid valve regurgitation is moderate to severe.   7. The aortic valve is grossly normal. Aortic valve regurgitation is    mild.   8. The inferior vena cava is normal in size with <50% respiratory  variability, suggesting right atrial pressure of 8 mmHg.   Comparison(s): Changes from prior study are noted.   Conclusion(s)/Recommendation(s): EF severely reduced compared to prior  studies. Wall motion abnormalities, most prominent in anterior, septal,  and lateral walls. Moderate to severe TR with elevated RVSP. Moderate MR.  LV thrombus excluded by definity  contrast. Brief VT during the exam.      Patient Profile      54 y.o. female w/PMHx of HTN, DM, HLD, CHB w/PPM, AFib  COVID infection April 2021 (not hospitalized)  Admitted to [facility] 11/27/2019 with progressive weakness, SOB found to be  in acute CHF, LVEF noted marked reduction down to 20-25% w/ regional wall motion abnormalties,  most prominent in anterior, septal, and lateral walls. Moderate to severe TR with elevated RVSP. Moderate MR. LV thrombus excluded by definity contrast. RV systolic function normal.  Developed WCT > VT and NSVT started on amiodarone EP consulted, device function intact, VT was being undercounted by her device, with VT monitor rate set to > 170. We lowered to 150  LHC/RHC 7/23 with no significant CAD, elevated filling pressures, and low cardiac output on norepinephrine. IABP placed  Developed recurrent VT/torsades x 3 on 7/24. Had brief CPR and shocked. QT > 665m. Subsequently intubated amio stopped > lidocaine  Pt has required pressors >> off all gtts 12/03/19 Extubated 12/03/19  R groin hematoma 12/03/19 > pressure held >> IABP removed 12/04/19, remains resolved    Assessment & Plan    1. PMVT     Had QT prolongation with amiodarone > lidocaine > off 12/03/19     Pacing rate was increased to 100bpm >> reduced to 90 > 70 post procedure yesterday     Keep K+ 4.0 or greater, Mag 2.0 or greater  Mechanism of her VT unclear (Dr. KCaryl Comesnot entirely convinced that the mechanism of VT was TdP) C/MRI very abnormal, in d/w  Dr. MAundra Dubinfavors Sarcoid (no pulm sarcoid on CT, ? Post inflammatory vs infection findings) QT better by EKG 12/03/19 Coreg and mexiletine started 12/06/19 She has had some PVCs, no VT S/p PPM > CRT-D yesterday (RV and L bundle/HIS) with Dr. TLovena LeSite is stable Device check this morning stable measurements CXR this AM with no ptx Wound care and activity restrictions were discussed with the patient No driving 6 months discussed with the patient EP/device follow up is in place  OK to discharge from our perspective when ready otherwise Continue mexiletine 1573mBID and titrate coreg as able We will sign off though remain available, please recall if needed   Continue with AHF team    For questions or updates, please contact [facility] Please consult [email or web address] for contact info under     Signed, ReBaldwin JamaicaPA-C  12/08/2019, 8:12 AM    EP Attending  Patient seen and examined. Agree with above. She is s/p biv ICD upgrade. Her left bundle area pacing lead is placed in the LV port and her QRS is narrow. She is stable for dc on meds as above.   GrMikle Bosworth.

## 2019-12-08 NOTE — TOC Benefit Eligibility Note (Signed)
Transition of Care [facility]) Benefit Eligibility Note    Patient Details  Name: NAJAH LIVERMAN MRN: [id number] Date of Birth: June 02, 1965   Medication/Dose: Wilder Glade 64m. daily  Covered?: Yes  Tier: 3 Drug  Prescription [facility]age [facility] [place] [place]Company/Phone Number:: [facility] Desk [phone] Co-Pay: Zero  Prior Approval: No  Deductible: Met       HShelda AltesPhone Number: 12/08/2019, 10:20 AM

## 2019-12-08 NOTE — TOC Benefit Eligibility Note (Signed)
Transition of Care [facility]) Benefit Eligibility Note    Patient Details  Name: CJ BEECHER MRN: [id number] Date of Birth: 21-Oct-1965   Medication/Dose: Delene Loll 42/51  Covered?: Yes  Tier: 3 Drug  Prescription Coverage Preferred Pharmacy: [facility] with Person/Company/Phone Number:: Gillis Ends [facility] NI#[phone]  Co-Pay: Zero  Prior Approval: Yes  Deductible: Met       Shelda Altes Phone Number: 12/08/2019, 11:56 AM

## 2019-12-08 NOTE — Progress Notes (Signed)
Pt has been ambulating. Feeling better. Discussed HF booklet, which pt has already read. Good reception. Will refer to [place] and gave walking guidelines for home.  [id number] [place], ACSM 11:25 AM 12/08/2019

## 2019-12-08 NOTE — Progress Notes (Signed)
Patient ID: Lisa Poole, female   DOB: [birth year], 54 y.o.   MRN: [id number]     Advanced Heart Failure Rounding Note  PCP-Cardiologist: No primary care provider on file.   Subjective:    No complaints, no dyspnea.  Had some PVCs, no NSVT.     Device upgrade to MDT CRT-D.   CMRI: >>LVEF 30%. RVEF 27%. + patchy LGE in the basal septum, basal-mid inferoseptum/inferior wall, basal anterior wall, mid anterolateral wall. This could be consistent with cardiac sarcoidosis. Alternatively, could see similar pattern with prior myocarditis.  Objective:   Weight Range: 78.6 kg Body mass index is 33.85 kg/m.   Vital Signs:   Temp:  [97.9 F (36.6 C)-98.5 F (36.9 C)] 97.9 F (36.6 C) (07/30 0810) Pulse Rate:  [0-295] 94 (07/30 0810) Resp:  [10-20] 17 (07/30 0810) BP: (85-125)/(59-80) 98/77 (07/30 0810) SpO2:  [0 %-99 %] 96 % (07/30 0810) Weight:  [78.6 kg] 78.6 kg (07/30 0011) Last BM Date: 12/07/19  Weight change: Filed Weights   12/06/19 0438 12/07/19 0142 12/08/19 0011  Weight: 77 kg 78.9 kg 78.6 kg    Intake/Output:   Intake/Output Summary (Last 24 hours) at 12/08/2019 0842 Last data filed at 12/08/2019 0811 Gross per 24 hour  Intake 843 ml  Output 2700 ml  Net -1857 ml      Physical Exam    General: NAD Neck: No JVD, no thyromegaly or thyroid nodule.  Lungs: Clear to auscultation bilaterally with normal respiratory effort. CV: Nondisplaced PMI.  Heart regular S1/S2, no S3/S4, no murmur.  No peripheral edema.   Abdomen: Soft, nontender, no hepatosplenomegaly, no distention.  Skin: Intact without lesions or rashes.  Neurologic: Alert and oriented x 3.  Psych: Normal affect. Extremities: No clubbing or cyanosis.  HEENT: Normal.   Telemetry   NSR 80s with BiV pacing (personally reviewed)    Labs    CBC Recent Labs    12/07/19 0413 12/08/19 0055  WBC 11.9* 11.1*  NEUTROABS 7.2 6.6  HGB 13.5 13.3  HCT 43.3 42.8  MCV 86.9 86.5  PLT 365 762   Basic  Metabolic Panel Recent Labs    12/07/19 0413 12/08/19 0055  NA 136 137  K 3.6 4.3  CL 102 104  CO2 27 24  GLUCOSE 170* 93  BUN 17 17  CREATININE 0.82 0.81  CALCIUM 8.9 8.8*  MG 1.9  --    Liver Function Tests No results for input(s): AST, ALT, ALKPHOS, BILITOT, PROT, ALBUMIN in the last 72 hours. No results for input(s): LIPASE, AMYLASE in the last 72 hours. Cardiac Enzymes No results for input(s): CKTOTAL, CKMB, CKMBINDEX, TROPONINI in the last 72 hours.  BNP: BNP (last 3 results) Recent Labs    11/26/19 2011  BNP 1,258.1*    ProBNP (last 3 results) No results for input(s): PROBNP in the last 8760 hours.   D-Dimer No results for input(s): DDIMER in the last 72 hours. Hemoglobin A1C No results for input(s): HGBA1C in the last 72 hours. Fasting Lipid Panel No results for input(s): CHOL, HDL, LDLCALC, TRIG, CHOLHDL, LDLDIRECT in the last 72 hours. Thyroid Function Tests No results for input(s): TSH, T4TOTAL, T3FREE, THYROIDAB in the last 72 hours.  Invalid input(s): FREET3  Other results:   Imaging    DG Chest 2 View  Result Date: 12/08/2019 CLINICAL DATA:  AICD. EXAM: CHEST - 2 VIEW COMPARISON:  CT chest 12/06/2019. FINDINGS: AICD noted stable position. Heart size normal. Improved aeration both lungs. No focal  infiltrate. No pleural effusion or pneumothorax. Degenerative changes scoliosis thoracic spine. IMPRESSION: 1.  AICD in stable position.  Heart size normal. 2.  Improved aeration both lungs.  No acute infiltrate. Electronically Signed   By: Marcello Moores  Register   On: 12/08/2019 06:56     Medications:     Scheduled Medications: . aspirin EC  81 mg Oral Daily  . carvedilol  3.125 mg Oral BID WC  . chlorhexidine gluconate (MEDLINE KIT)  15 mL Mouth Rinse BID  . Chlorhexidine Gluconate Cloth  6 each Topical Daily  . dapagliflozin propanediol  10 mg Oral Daily  . digoxin  0.125 mg Oral Daily  . docusate sodium  100 mg Oral BID  . feeding supplement  (GLUCERNA SHAKE)  237 mL Oral BID BM  . gabapentin  600 mg Oral BID  . insulin aspart  0-5 Units Subcutaneous QHS  . insulin aspart  0-9 Units Subcutaneous TID WC  . insulin aspart  3 Units Subcutaneous TID WC  . insulin detemir  7 Units Subcutaneous BID  . mexiletine  150 mg Oral Q12H  . multivitamin with minerals  1 tablet Oral Daily  . pantoprazole  40 mg Oral Daily  . polyethylene glycol  17 g Oral Daily  . pravastatin  10 mg Oral q1800  . predniSONE  40 mg Oral Q breakfast  . sacubitril-valsartan  1 tablet Oral BID  . sodium chloride flush  10-40 mL Intracatheter Q12H  . sodium chloride flush  3 mL Intravenous Q12H  . sodium chloride flush  3 mL Intravenous Q12H  . spironolactone  25 mg Oral Daily    Infusions: . sodium chloride Stopped (12/04/19 1707)    PRN Medications: Place/Maintain arterial line **AND** sodium chloride, acetaminophen, ipratropium-albuterol, ondansetron (ZOFRAN) IV, sodium chloride flush    Assessment/Plan   1. Acute systolic CHF: Nonischemic cardiomyopathy.  Echo this admission with EF newly decreased to 20-25% with septal-lateral dyssynchrony and septal severe hypokinesis, normal RV, moderate TR, moderate MR. Echo in 5/20 prior to PPM placement showed EF 60-65%.  Possible causes of cardiomyopathy include chronic RV pacing, frequent NSVT/PVCs, ?CAD (has RFs), ?viral myocarditis (relatively recent COVID-19 infection).  No ETOH/drugs.  She has had increased dyspnea/palpitations x several weeks. Volume overloaded and CXR suggested pulmonary edema at time of admit. Developed CGS overnight 7/20 w/ hypotension and narrow pulse pressure, w/ subsequent AKI, SCr 1.18>>1.85>>2.26. Lactic acid increased from 2.1>>5.0. Diuresis subsequently limited. She was initially on dopamine, then norepinephrine, milrinone, and back to norepinephrine.  Least ventricular ectopy on norepinephrine.  LHC/RHC 7/23 with no significant CAD, elevated filling pressures, and low cardiac output  on norepinephrine. IABP placed. IABP removed 7/26. Off pressors. cMRI 7/27 showed patchy LGE in the basal septum, basal-mid inferoseptum/inferior wall, basal anterior wall, mid anterolateral wall. Cardiac MRI LGE pattern could be consistent with cardiac sarcoidosis (would explain earlier CHB).  Alternatively, prior viral myocarditis (recent COVID-19).  CT chest with ground glass lower lobes but no evidence for pulmonary sarcoidosis.  She is now s/p device upgrade to MDT CRT-D.  Not volume overloaded on exam.  - I do not think that she will need a loop diuretic at home.  - Continue digoxin 0.125 daily. Dig level ok - Continue spironolactone 25 mg daily  - Continue Entresto 49/51 mg bid - Continue Coreg 3.125 mg bid  - Continue dapagliflozin 10 mg daily.  - Solumedrol started 7/24 for presumed sarcoid => now on prednisone 40 mg daily.  - Will need PET scan  done at [facility] or [facility] ASAP for cardiac sarcoidosis => if not suggestive, will stop prednisone.  - Cardiomyopathy likely worsened by frequent VT/PVCs. Now on mexiletine to suppress.  2. Wide complex tachycardia: VT with multiple morphologies.  Increased ventricular arrhythmias over 6 months.  ?Underlying cause.  No significant coronary disease.  Consider infiltrative disease like sarcoidosis.  Increased VT runs with milrinone and dopamine, off both, did better with norepinephrine. IABP implanted 7/23=>removed 7/26.   Torsades/VT x 3 on 7/24 with prolonged QT required DC-CV. Amio switched to lido. Lido stopped 7/26 by Dr. Caryl Comes. Now on mexiletine, minimal ectopy overnight.  - Keep K > 4.0 Mg > 2.0 - Solumedrol started 7/24 for presumed cardiac sarcoid => transitioned to prednisone 40 mg daily.   - She is now s/p ICD upgrade.  - Continue mexiletine.   3. H/o CHB: Cause is uncertain.  Cath in 5/20 prior to PPM showed no significant disease. Possible cardiac sarcoidosis by cMRI but no evidence for pulmonary sarcoidosis. Needs cardiac PET scan for further  evaluation for sarcoidosis, will try to get this asap.  4. AKI: Resolved.   5. Right Groin Hematoma: Developed 7/26 at IABP site. Femstop placed.  Noncontrast abd/pelvic CT showed moderate right groin hematoma but no retroperitoneal bleed. Resolved.  6. Disposition: Home today.  Followup in CHF clinic within the next 2 wks.  Followup with Dr. Lovena Le.  Need to schedule cardiac PET to assess for cardiac sarcoidosis at [facility] or [facility] some time in the next month ideally.  Cardiac meds for discharge: dapagliflozin 10 mg daily (or Jardiance if insurance prefers), Entresto 49/51 bid, Coreg 3.125 mg bid, spironolactone 25 daily, metformin 500 mg bid, digoxin 0.125, ASA 81, mexiletine 150 bid, pravastatin 10 daily, prednisone 40 mg daily for 5 more days then 20 mg daily until she has her cardiac PET.   Length of Stay: [address]  12/08/2019 8:42 AM

## 2019-12-08 NOTE — Progress Notes (Signed)
D/C instructions given and reviewed. Questions answered at this time. Awaiting TOC pharmacy to deliver meds.

## 2019-12-12 NOTE — H&P (Signed)
ICD Criteria  Current LVEF:30%. Within 12 months prior to implant: Yes   Heart failure history: Yes, Class III  Cardiomyopathy history: Yes, Non-Ischemic Cardiomyopathy.  Atrial Fibrillation/Atrial Flutter: No.  Ventricular tachycardia history: Yes, Hemodynamic instability present. VT Type: Sustained Ventricular Tachycardia - Polymorphic.  Cardiac arrest history: No.  History of syndromes with risk of sudden death: No.  Previous ICD: No.  Current ICD indication: Secondary  PPM indication: Yes. Pacing type: Ventricular. Greater than 40% RV pacing requirement anticipated. Indication: Complete Heart Block  Class I or II Bradycardia indication present: Yes  Beta Blocker therapy for 3 or more months: Yes, prescribed.   Ace Inhibitor/ARB therapy for 3 or more months: Yes, prescribed.    I have seen Lisa Poole is a 54 y.o. femalepre-procedural and has been referred by Dr. Aundra Dubin for consideration of ICD implant for secondary prevention of sudden death.  The patient's chart has been reviewed and they meet criteria for ICD implant.  I have had a thorough discussion with the patient reviewing options.  The patient and their family (if available) have had opportunities to ask questions and have them answered. The patient and I have decided together through the [place] [employer] to proceed with ICD at this time.  Risks, benefits, alternatives to ICD implantation were discussed in detail with the patient today. The patient  understands that the risks include but are not limited to bleeding, infection, pneumothorax, perforation, tamponade, vascular damage, renal failure, MI, stroke, death, inappropriate shocks, and lead dislodgement and she wishes to proceed.

## 2019-12-13 ENCOUNTER — Telehealth (HOSPITAL_COMMUNITY): Payer: Self-pay

## 2019-12-13 NOTE — Telephone Encounter (Signed)
Pt insurance is active and benefits verified through [facility] Medicare. Co-pay $0.00, DED $0.00/$0.00 met, out of pocket $7,550.00/$0.00 met, co-insurance 20%. No pre-authorization required. Passport, 12/13/19 @ 4:09PM, ZJQ#[id number]  Pt insurance is active through Medicaid. KGO#[id number]  Will fax over Medicaid Reimbursement form to Dr. Aundra Dubin  Will contact patient to see if she is interested in the Cardiac Rehab Program. If interested, patient will need to complete follow up appt. Once completed, patient will be contacted for scheduling upon review by the RN Navigator.

## 2019-12-13 NOTE — Telephone Encounter (Signed)
Attempted to call patient in regards to Cardiac Rehab - LM on VM 

## 2019-12-15 ENCOUNTER — Telehealth (HOSPITAL_COMMUNITY): Payer: Self-pay | Admitting: *Deleted

## 2019-12-15 ENCOUNTER — Encounter (HOSPITAL_COMMUNITY): Payer: Self-pay | Admitting: Cardiology

## 2019-12-15 ENCOUNTER — Ambulatory Visit (HOSPITAL_COMMUNITY)
Admit: 2019-12-15 | Discharge: 2019-12-15 | Disposition: A | Discharge status: Home / Self Care | Payer: Medicare Other | Attending: Cardiology | Admitting: Cardiology

## 2019-12-15 ENCOUNTER — Other Ambulatory Visit: Payer: Self-pay

## 2019-12-15 VITALS — BP 118/80 | HR 90 | Ht 60.0 in | Wt 173.0 lb

## 2019-12-15 DIAGNOSIS — I5022 Chronic systolic (congestive) heart failure: Secondary | ICD-10-CM | POA: Diagnosis not present

## 2019-12-15 DIAGNOSIS — I442 Atrioventricular block, complete: Secondary | ICD-10-CM | POA: Insufficient documentation

## 2019-12-15 DIAGNOSIS — Z880 Allergy status to penicillin: Secondary | ICD-10-CM | POA: Insufficient documentation

## 2019-12-15 DIAGNOSIS — E119 Type 2 diabetes mellitus without complications: Secondary | ICD-10-CM | POA: Diagnosis not present

## 2019-12-15 DIAGNOSIS — Z79899 Other long term (current) drug therapy: Secondary | ICD-10-CM | POA: Insufficient documentation

## 2019-12-15 DIAGNOSIS — Z95810 Presence of automatic (implantable) cardiac defibrillator: Secondary | ICD-10-CM | POA: Insufficient documentation

## 2019-12-15 DIAGNOSIS — J45909 Unspecified asthma, uncomplicated: Secondary | ICD-10-CM | POA: Insufficient documentation

## 2019-12-15 DIAGNOSIS — M1712 Unilateral primary osteoarthritis, left knee: Secondary | ICD-10-CM | POA: Diagnosis not present

## 2019-12-15 DIAGNOSIS — Z791 Long term (current) use of non-steroidal anti-inflammatories (NSAID): Secondary | ICD-10-CM | POA: Insufficient documentation

## 2019-12-15 DIAGNOSIS — Z7952 Long term (current) use of systemic steroids: Secondary | ICD-10-CM | POA: Insufficient documentation

## 2019-12-15 DIAGNOSIS — E785 Hyperlipidemia, unspecified: Secondary | ICD-10-CM | POA: Insufficient documentation

## 2019-12-15 DIAGNOSIS — Z7984 Long term (current) use of oral hypoglycemic drugs: Secondary | ICD-10-CM | POA: Insufficient documentation

## 2019-12-15 DIAGNOSIS — E7800 Pure hypercholesterolemia, unspecified: Secondary | ICD-10-CM | POA: Insufficient documentation

## 2019-12-15 DIAGNOSIS — S31109A Unspecified open wound of abdominal wall, unspecified quadrant without penetration into peritoneal cavity, initial encounter: Secondary | ICD-10-CM

## 2019-12-15 DIAGNOSIS — Z7982 Long term (current) use of aspirin: Secondary | ICD-10-CM | POA: Diagnosis not present

## 2019-12-15 DIAGNOSIS — I340 Nonrheumatic mitral (valve) insufficiency: Secondary | ICD-10-CM | POA: Insufficient documentation

## 2019-12-15 DIAGNOSIS — I472 Ventricular tachycardia: Secondary | ICD-10-CM | POA: Diagnosis not present

## 2019-12-15 DIAGNOSIS — Z8616 Personal history of COVID-19: Secondary | ICD-10-CM | POA: Insufficient documentation

## 2019-12-15 DIAGNOSIS — Z888 Allergy status to other drugs, medicaments and biological substances status: Secondary | ICD-10-CM | POA: Insufficient documentation

## 2019-12-15 DIAGNOSIS — I110 Hypertensive heart disease with heart failure: Secondary | ICD-10-CM | POA: Diagnosis not present

## 2019-12-15 DIAGNOSIS — I97638 Postprocedural hematoma of a circulatory system organ or structure following other circulatory system procedure: Secondary | ICD-10-CM | POA: Diagnosis not present

## 2019-12-15 DIAGNOSIS — Z881 Allergy status to other antibiotic agents status: Secondary | ICD-10-CM | POA: Insufficient documentation

## 2019-12-15 DIAGNOSIS — I428 Other cardiomyopathies: Secondary | ICD-10-CM | POA: Diagnosis not present

## 2019-12-15 DIAGNOSIS — Z882 Allergy status to sulfonamides status: Secondary | ICD-10-CM | POA: Diagnosis not present

## 2019-12-15 LAB — DIGOXIN LEVEL: Digoxin Level: 0.8 ng/mL (ref 0.8–2.0)

## 2019-12-15 LAB — CBC
HCT: 46.3 % — ABNORMAL HIGH (ref 36.0–46.0)
Hemoglobin: 13.6 g/dL (ref 12.0–15.0)
MCH: 26.6 pg (ref 26.0–34.0)
MCHC: 29.4 g/dL — ABNORMAL LOW (ref 30.0–36.0)
MCV: 90.6 fL (ref 80.0–100.0)
Platelets: 449 10*3/uL — ABNORMAL HIGH (ref 150–400)
RBC: 5.11 MIL/uL (ref 3.87–5.11)
RDW: 16.6 % — ABNORMAL HIGH (ref 11.5–15.5)
WBC: 12.5 10*3/uL — ABNORMAL HIGH (ref 4.0–10.5)
nRBC: 0 % (ref 0.0–0.2)

## 2019-12-15 LAB — BASIC METABOLIC PANEL
Anion gap: 12 (ref 5–15)
BUN: 16 mg/dL (ref 6–20)
CO2: 24 mmol/L (ref 22–32)
Calcium: 9.5 mg/dL (ref 8.9–10.3)
Chloride: 104 mmol/L (ref 98–111)
Creatinine, Ser: 0.82 mg/dL (ref 0.44–1.00)
GFR calc Af Amer: >60 mL/min (ref >60)
GFR calc non Af Amer: >60 mL/min (ref >60)
Glucose, Bld: 141 mg/dL — ABNORMAL HIGH (ref 70–99)
Potassium: 4.3 mmol/L (ref 3.5–5.1)
Sodium: 140 mmol/L (ref 135–145)

## 2019-12-15 MED ORDER — CARVEDILOL 6.25 MG PO TABS: 6.2500 mg | ORAL_TABLET | Freq: Two times a day (BID) | ORAL | 1 refills | Status: DC

## 2019-12-15 NOTE — Patient Instructions (Addendum)
Increase Carvedilol to 6.25 mg Twice daily   Labs done today, your results will be available in MyChart, we will contact you for abnormal readings.  You have been referred to have a PET Scan done at [facility], we have provided you a hand-out on directions and instructions for this, once it is scheduled we will call you to give you the date and time.  Please follow up with our heart failure pharmacist in 1 month  Your physician recommends that you schedule a follow-up appointment in: 2 months  If you have any questions or concerns before your next appointment please send [place] a message through [place] or call our office at [phone].    TO LEAVE A MESSAGE FOR THE NURSE SELECT OPTION 2, PLEASE LEAVE A MESSAGE INCLUDING:  YOUR NAME  DATE OF BIRTH  CALL BACK NUMBER  REASON FOR CALL**this is important as we prioritize the call backs  YOU WILL RECEIVE A CALL BACK THE SAME DAY AS LONG AS YOU CALL BEFORE 4:00 PM  At the [facility], you and your health needs are our priority. As part of our continuing mission to provide you with exceptional heart care, we have created designated Provider Care Teams. These Care Teams include your primary Cardiologist (physician) and Advanced Practice Providers (APPs- Physician Assistants and Nurse Practitioners) who all work together to provide you with the care you need, when you need it.   You may see any of the following providers on your designated Care Team at your next follow up:  Dr Glori Bickers  Dr Haynes Kerns, NP  Lyda Jester, [place]  Audry Riles, PharmD   Please be sure to bring in all your medications bottles to every appointment.

## 2019-12-15 NOTE — Progress Notes (Signed)
Wound culture obtained from R groin, site then cleaned and new dressing applied

## 2019-12-15 NOTE — Progress Notes (Signed)
Advanced Heart Failure Clinic Note   Referring Physician: PCP: Milford Cage, PA PCP-Cardiologist: Dr. Aundra Dubin EP: Dr. Lovena Le   HPI:  54 y/o female w/ h/o HTN, HLD, T2DM, CHB s/p PPM implant 02/2019 followed by Dr. Lovena Le, PAF (not on a/c), aortic insufficiency,  recent COVID infection in April 2021 and recent diagnosis of systolic heart failure/ nonischemic CM.   In May 2020, she underwent w/u for SOB and mildly elevated troponin I (0.09>>0.10>>0.12>>0.15). LHC 09/2018 showed no significant coronary disease.Therewas minimal systolic bridging of 5 to 10% in the mid LAD. The left circumflex vesselwas normal. The RCA is a dominant vessel with a superior takeoff andwas normal.Echo 09/2018. LVEF 60-65%, mild LVH, RV normal. Mild- mod AI. No stenosis.  In 02/2019, she presented w/ chest pain and found to be in CHB and underwentPPMinplant ([employer]). She is followed by Dr. Lovena Le. EP notes outline detection of PAF but episodes have been very brief, thus she has not been on anticoagulation.   In April 2021, she was diagnosed w/ COVID but did not require hospitalization. She has not been vaccinated.   She presented to the [facility] on 7/18 w/ complaint of progressive dyspnea, intermittent CP, orthopnea, PND and LEE. Symptoms present for several weeks but worsened over the last 2 days, progressing to symptoms at rest.   In ED, found to be in acute CHF.BNP 1,258. CXR w/ developing edema.HS troponin 32>>52>>67.EKG and tele w/ WCT, felt to be VT. Rates in the 160s-170s. BP has been soft but stable. Started on IV amiodarone in ED. Initial K 4.2>>3.2 today. Mg 1.7. SCr 1.37>>1.18. Started on lasix gtt.   Echo was repeated and showed new drop in LVEF, down to 20-25% w/ regional wall motion abnormalties,most prominent in anterior, septal, and lateral walls. Moderate to severe TR with elevated RVSP. Moderate MR. LV thrombus excluded by definity contrast. RV systolic function normal.    She developed worsening HF/ cardiogenic shock w/ low Co-ox in the 40s, required inotropic support + placement of IABP. IV amiodarone started for VT suppression. Developed Torsades/VT x 3 on 7/24 with prolonged QT required DC-CV. Amio switched to lido. Lido stopped 7/26 by Dr. Caryl Comes. Started on mexiletine. Had repeat LHC that showed no significant coronary disease. cMRI c/w possible cardiac sarcoidosis vs prior myocarditis. She was placed on empiric prednisone for possible sarcoid w/ plans to refer to [facility] for cardiac PET scan. Of note, chest CT 7/21 with ground glass lower lobes but no evidence for pulmonary sarcoidosis.   She was ultimately weaned off intropes and IABP. Rhythm stabilized w/o further recurrence. Continued on Mexiletine for maintenance therapy. She underwent device upgraed to CRT-D prior to d/c.  She presents to clinic now for hospital f/u. Here w/ her husband. Feels well from cardiac standpoint. No cardiac symptoms since being discharged. Denies dyspnea. No exertional symptoms. No orthopnea/ PND or LEE. Daily wts have been stable at home. No wt gain. Reports full compliance w/ meds. Tolerating well w/o side effects. No palpitations. EKG today shows NSR. HR in the 90s. Device pocket stable. Steri-strips remain in place. No visible drainage. BP 118/80.   Her only concern is a wound w/ drainage from her rt femoral IABP insertion site. Several days after returning home, she notice a small opening w/ mostly serous drainage. She has notices occasional pus-like exudate but no severe pain, surrounding erythema nor s/s of systemic infection. Denies fever and chills. She has been trying to keep the area clean and dry. She saw her PCP  who prescribed a course of abx, Augmentin 875-125 mg bid, which she is still currently taking.     Review of Systems: [y] = yes, _0  = no   General: Weight gain _1 ; Weight loss _2 ; Anorexia _3 ; Fatigue _4 ; Fever _5 ; Chills _6 ; Weakness _7   Cardiac: Chest  pain/pressure _8 ; Resting SOB _9 ; Exertional SOB _10 ; Orthopnea _11 ; Pedal Edema _12 ; Palpitations _13 ; Syncope _14 ; Presyncope _15 ; Paroxysmal nocturnal dyspnea_16   Pulmonary: Cough _17 ; Wheezing_18 ; Hemoptysis_19 ; Sputum _20 ; Snoring _21   GI: Vomiting_22 ; Dysphagia_23 ; Melena_24 ; Hematochezia _25 ; Heartburn_26 ; Abdominal pain _27 ; Constipation _28 ; Diarrhea _29 ; BRBPR _30   GU: Hematuria_31 ; Dysuria _32 ; Nocturia_33   Vascular: Pain in legs with walking _34 ; Pain in feet with lying flat _35 ; Non-healing sores [ Y]; Stroke _36 ; TIA _37 ; Slurred speech _38 ;  Neuro: Headaches_39 ; Vertigo_40 ; Seizures_41 ; Paresthesias_42 ;Blurred vision _43 ; Diplopia _44 ; Vision changes _45   Ortho/Skin: Arthritis _46 ; Joint pain _47 ; Muscle pain _48 ; Joint swelling _49 ; Back Pain _50 ; Rash _51   Psych: Depression_52 ; Anxiety_53   Heme: Bleeding problems _54 ; Clotting disorders _55 ; Anemia _56   Endocrine: Diabetes _57 ; Thyroid dysfunction_58    Past Medical History:  Diagnosis Date   Aortic insufficiency    Asthma 09/13/2006   Complete heart block ([place]) 02/11/2019   Cystocele without uterine prolapse 12/12/2018   Diabetes mellitus without complication (HCC)    Elevated troponin level    Hypercholesteremia    Hypertension    Primary osteoarthritis of left knee 12/10/2017    Current Outpatient Medications  Medication Sig Dispense Refill   ACCU-CHEK AVIVA PLUS test strip 3 (three) times daily.     albuterol (PROVENTIL HFA;VENTOLIN HFA) 108 (90 Base) MCG/ACT inhaler Inhale 1-2 puffs into the lungs every 6 (six) hours as needed for wheezing or shortness of breath. 1 Inhaler 0   amoxicillin-clavulanate (AUGMENTIN) 875-125 MG tablet Take 1 tablet by mouth 2 (two) times daily.     aspirin EC 81 MG tablet Take 81 mg by mouth daily. Swallow whole.     Biotin 10 MG CAPS Take 10 mg by mouth daily.     carvedilol (COREG) 3.125 MG tablet Take 1 tablet (3.125 mg total) by mouth 2 (two) times daily with a meal. 60  tablet 6   cetirizine (ZYRTEC) 10 MG tablet Take 10 mg by mouth daily.     Cholecalciferol (VITAMIN D-3) 25 MCG (1000 UT) CAPS Take 1,000 Units by mouth daily.      dapagliflozin propanediol (FARXIGA) 10 MG TABS tablet Take 1 tablet (10 mg total) by mouth daily. 30 tablet 6   digoxin (LANOXIN) 0.125 MG tablet Take 1 tablet (0.125 mg total) by mouth daily. 30 tablet 6   gabapentin (NEURONTIN) 600 MG tablet Take 600 mg by mouth 2 (two) times daily.     HYDROcodone-acetaminophen (NORCO) 10-325 MG tablet Take 1 tablet by mouth 4 (four) times daily.      meloxicam (MOBIC) 15 MG tablet Take 15 mg by mouth daily.     metFORMIN (GLUCOPHAGE) 500 MG tablet Take 500 mg by mouth 2 (two) times daily.     mexiletine (MEXITIL) 150 MG capsule Take 1 capsule (150 mg total) by mouth every 12 (twelve) hours. 60 capsule  6   Multiple Vitamins-Minerals (CENTRUM SILVER 50+WOMEN) TABS Take 1 tablet by mouth daily.     naloxone (NARCAN) 4 MG/0.1ML LIQD nasal spray kit Place 1 spray into the nose as directed.     omeprazole (PRILOSEC) 40 MG capsule Take 40 mg by mouth daily.     ondansetron (ZOFRAN ODT) 4 MG disintegrating tablet Take 1 tablet (4 mg total) by mouth every 8 (eight) hours as needed for nausea or vomiting. 20 tablet 0   pravastatin (PRAVACHOL) 10 MG tablet Take 1 tablet (10 mg total) by mouth daily at 6 PM. 30 tablet 5   predniSONE (DELTASONE) 20 MG tablet Take 1-2 tablets (20-40 mg total) by mouth daily with breakfast. Take 2 tablets (42m) by mouth daily for the next 5 days then take 1 tablet (244m [facility] 1   sacubitril-valsartan (ENTRESTO) 49-51 MG Take 1 tablet by mouth 2 (two) times daily. 60 tablet 6   spironolactone (ALDACTONE) 25 MG tablet Take 1 tablet (25 mg total) by mouth daily. 30 tablet 6   venlafaxine XR (EFFEXOR-XR) 150 MG 24 hr capsule Take 150 mg by mouth daily.     No current facility-administered medications for this encounter.     Allergies  Allergen Reactions   Lisinopril Other (See Comments) and Cough    Flu like symptoms, also    Ace Inhibitors Other (See Comments)    Flu-like symptoms   Augmentin [Amoxicillin-Pot Clavulanate] Nausea And Vomiting   Benazepril Other (See Comments)    Flu like symptoms   Oxycodone-Acetaminophen Nausea And Vomiting   Sulfa Antibiotics Nausea And Vomiting      Social History   Socioeconomic History   Marital status: Married    Spouse name: Not on file   Number of children: Not on file   Years of education: Not on file   Highest education level: Not on file  Occupational History   Not on file  Tobacco Use   Smoking status: Never Smoker   Smokeless tobacco: Never Used  Vaping Use   Vaping Use: Never used  Substance and Sexual Activity   Alcohol use: No   Drug use: No   Sexual activity: Yes    Birth control/protection: Post-menopausal  Other Topics Concern   Not on file  Social History Narrative   Not on file   Social Determinants of Health   Financial Resource Strain:    Difficulty of Paying Living Expenses:   Food Insecurity:    Worried About RuCharity fundraisern the Last Year:    RaArboriculturistn the Last Year:   Transportation Needs:    LaFilm/video editorMedical):    Lack of Transportation (Non-Medical):   Physical Activity:    Days of Exercise per Week:    Minutes of Exercise per Session:   Stress:    Feeling of Stress :   Social Connections:    Frequency of Communication with Friends and Family:    Frequency of Social Gatherings with Friends and Family:    Attends Religious Services:    Active Member of Clubs or Organizations:    Attends ClMusic therapist   Marital Status:   Intimate Partner Violence:    Fear of Current or Ex-Partner:    Emotionally Abused:    Physically Abused:    Sexually Abused:       Family History  Problem Relation Age of Onset   Colon cancer  Maternal Grandmother    Alzheimer's disease Mother     Vitals:   12/15/19 0935  BP: 118/80  Pulse: 90  SpO2: 98%  Weight: 78.5 kg (173 lb)  Height: 5' (1.524 m)     PHYSICAL EXAM: General:  Well appearing, moderately obese. No respiratory difficulty HEENT: normal Neck: supple. no JVD. Carotids 2+ bilat; no bruits. No lymphadenopathy or thyromegaly appreciated. Cor: PMI nondisplaced. Regular rate & rhythm. No rubs, gallops or murmurs. Lt upper chest device pocket stable, + steri-strip covering site w/o drainage  Lungs: clear Abdomen: soft, nontender, nondistended. No hepatosplenomegaly. No bruits or masses. Good bowel sounds. Rt Groin: small pea sized wound w/ small amount of serous drainage. No surrounding erythema  Extremities: no cyanosis, clubbing, rash, edema Neuro: alert & oriented x 3, cranial nerves grossly intact. moves all 4 extremities w/o difficulty. Affect pleasant.  ECG: Atrial-sensed ventricular-paced rhythm, 90 bpm     ASSESSMENT & PLAN:  1. Chronic Systolic Heart Failure: Nonischemic cardiomyopathy. Recent admit 7/21 w/ CGS requiring inotropic support + IABP placement. Echo 7/21 with EF newly decreased to 20-25% with septal-lateral dyssynchrony and septal severe hypokinesis, normal RV, moderate TR, moderate MR. Echo in 5/20 prior to PPM placement showed EF 60-65%. [place] 7/21 showed no significant CAD.Possible causes of cardiomyopathy include chronic RV pacing, frequent NSVT/PVCs, ?viral myocarditis (relatively recent COVID-19 infection). No ETOH/drugs. There is concern for potential inflammatory CM. cMRI 7/27 showed patchy LGE in the basal septum, basal-mid inferoseptum/inferior wall, basal anterior wall, mid anterolateral wall. Cardiac MRI LGE pattern could be consistent with cardiac sarcoidosis (would explain earlier CHB) vs prior viral myocarditis. Referral to [facility] for cardiac PET scan to further evaluate for sarcoid pending.  - Volume and functional status much  improved, post hospitalization - NYHA Class II - Euvolemic on exam  - Continue Entresto 49/51 mg bid (doubt BP will tolerate increase currently) - Continue Spironolactone 25 mg daily  - Continue dapagliflozin 10 mg daily  - Increase Coreg to 6.25 mg bid (HR 90s)  - Continue Digoxin 0.125 mg daily. Check Dig level today  - Solumedrol started 7/24 for presumed sarcoid =>discharged home on taper w/ plans to continue until her appt at [facility]. Currently on 20 mg daily. Given groin wound, concern for poor wound healing. Plan to reduce prednisone down to 10 mg daily.   - Referral placed to [facility] for cardiac PET scan to exclude cardiac sarcoidosis=> if not suggestive, will stop prednisone. Of note, chest CT 7/21 with ground glass lower lobes but no evidence for pulmonary sarcoidosis.  - Cardiomyopathy likely worsened by frequent VT/PVCs. Now on mexiletine to suppress.  - She is now s/p device upgrade to MDT CRT-D.    2. CHB: s/p PPM 2/21>> had device upgrade to MDT CRT-D 7/21 following recurrent VT/ torsades - as noted above, concern for cardiac sarcoidosis, plan for cardiac PET scan  - device pocket stable. Has wound check in device clinic scheduled next week - EP provider clinic arranged 8/19. Followed by Dr. Lovena Le     3. VT - s/p MDT CRT-D 7/21 - off amio w/ prolonged QT w/ development of torsades - continue mexiletine 150 mg bid  - stable QT/QTc on EKG today 392/479 ms  - check BMP and Mg level   4. Rt Groin Wound - recent hospitalization w/ Rt femoral artery IABP placement c/b right groin hematoma - now w/ small pea-sized opening w/ mostly serous drainage. She has noticed occasional pus-like exudate but no severe pain, surrounding  erythema nor s/s of systemic infection. Denies fever and chills. She has been trying to keep the area clean and dry. She saw her PCP who prescribed a course of abx, Augmentin 875-125 mg bid, which she is still currently taking.  - wound culture obtained today  -  continue current course of abx provided by PCP, Augmentin 875-125 mg bid - check CBC today  - encouraged to keep area clean and dry (provided sterile gauze to take home) - daily prednisone may be contributing to poor wound healing, decrease to 10 mg daily  - bring back next week for nursing care visit on day Dr. Aundra Dubin is in the office to reassess site  - pt instructed to notify office if site worsens or if development of systemic symptoms (fever, chills)  F/u w/ pharmD in 2-3 weeks for further med titration  F/u w/ Dr. Aundra Dubin in 6 weeks    Lyda Jester, PA-C 12/15/19

## 2019-12-15 NOTE — Telephone Encounter (Signed)
Per Lyda Jester, PA pt should decrease Prednisone to 10 mg Daily, attempted to notify pt and left detailed message to decrease prednisone

## 2019-12-17 ENCOUNTER — Telehealth: Payer: Self-pay | Admitting: Student

## 2019-12-17 DIAGNOSIS — Z22322 Carrier or suspected carrier of Methicillin resistant Staphylococcus aureus: Secondary | ICD-10-CM

## 2019-12-17 LAB — AEROBIC CULTURE W GRAM STAIN (SUPERFICIAL SPECIMEN)

## 2019-12-17 MED ORDER — LINEZOLID 600 MG PO TABS: 600.0000 mg | ORAL_TABLET | Freq: Two times a day (BID) | ORAL | 0 refills | Status: DC

## 2019-12-17 NOTE — Telephone Encounter (Addendum)
   Patient recently seen by Dr. Aundra Dubin in the office and noted to have right groin wound. Cultures came back positive for MRSA. Per Dr. Aundra Dubin patient should stop Augmentin and start Linezolid 600mg  twice daily for 10 days. Called and notified patient of this and sent prescription of Linezolid to requested Pharmacy.  Of note, after sending in prescription and talking with patient saw that patient is on Effexor which is contraindicated with Linezolid due to increased risk of serotonin syndrome. Called and spoke with Pharmacist who recommended holding Effexor while on Linezolid and then can restart Effexor. I tried to call patient and update her on this but had to leave a message. I left this recommendation with patient's Pharmacy who will relay message when patient comes to pick up prescription. Will also ask my RN, Caprice Beaver, to reach out to patient tomorrow to re-iterate this.  Darreld Mclean, PA-C 12/17/2019 3:04 PM

## 2019-12-18 ENCOUNTER — Telehealth (HOSPITAL_COMMUNITY): Payer: Self-pay

## 2019-12-18 MED ORDER — PREDNISONE 20 MG PO TABS: 10.0000 mg | ORAL_TABLET | Freq: Every day | ORAL | 1 refills | Status: DC

## 2019-12-18 NOTE — Telephone Encounter (Signed)
Patient advised and verbalized understanding. Med list updated to reflect changes.  ? ?

## 2019-12-18 NOTE — Telephone Encounter (Signed)
-----   Message from Larey Dresser, MD sent at 12/17/2019  2:48 PM EDT ----- Start linezolid 600 mg po bid x 10 days.  Colletta [place], do you agree?

## 2019-12-18 NOTE — Telephone Encounter (Signed)
Called patient, she did get the message- and called and spoke to another doctor office who gave her the information as well.  Patient thankful for the calls.

## 2019-12-18 NOTE — Telephone Encounter (Signed)
Pt aware and agreeable.  

## 2019-12-19 ENCOUNTER — Encounter (HOSPITAL_COMMUNITY): Payer: Self-pay | Admitting: *Deleted

## 2019-12-19 ENCOUNTER — Ambulatory Visit (INDEPENDENT_AMBULATORY_CARE_PROVIDER_SITE_OTHER): Payer: Medicare Other | Admitting: Emergency Medicine

## 2019-12-19 ENCOUNTER — Emergency Department (HOSPITAL_COMMUNITY): Payer: Medicare Other

## 2019-12-19 ENCOUNTER — Other Ambulatory Visit: Payer: Self-pay

## 2019-12-19 ENCOUNTER — Inpatient Hospital Stay (HOSPITAL_COMMUNITY): Payer: Medicare Other

## 2019-12-19 ENCOUNTER — Inpatient Hospital Stay (HOSPITAL_COMMUNITY)
Admission: EM | Admit: 2019-12-19 | Discharge: 2019-12-27 | DRG: 309 | Disposition: A | Discharge status: Home Health | Payer: Medicare Other | Attending: Cardiology | Admitting: Cardiology

## 2019-12-19 DIAGNOSIS — Z79899 Other long term (current) drug therapy: Secondary | ICD-10-CM

## 2019-12-19 DIAGNOSIS — Z96652 Presence of left artificial knee joint: Secondary | ICD-10-CM | POA: Diagnosis present

## 2019-12-19 DIAGNOSIS — Z7984 Long term (current) use of oral hypoglycemic drugs: Secondary | ICD-10-CM | POA: Diagnosis not present

## 2019-12-19 DIAGNOSIS — I493 Ventricular premature depolarization: Secondary | ICD-10-CM | POA: Diagnosis present

## 2019-12-19 DIAGNOSIS — Z885 Allergy status to narcotic agent status: Secondary | ICD-10-CM

## 2019-12-19 DIAGNOSIS — I110 Hypertensive heart disease with heart failure: Secondary | ICD-10-CM | POA: Diagnosis present

## 2019-12-19 DIAGNOSIS — M1712 Unilateral primary osteoarthritis, left knee: Secondary | ICD-10-CM | POA: Diagnosis present

## 2019-12-19 DIAGNOSIS — E119 Type 2 diabetes mellitus without complications: Secondary | ICD-10-CM | POA: Diagnosis present

## 2019-12-19 DIAGNOSIS — I82C13 Acute embolism and thrombosis of internal jugular vein, bilateral: Secondary | ICD-10-CM | POA: Diagnosis present

## 2019-12-19 DIAGNOSIS — I472 Ventricular tachycardia, unspecified: Secondary | ICD-10-CM

## 2019-12-19 DIAGNOSIS — I10 Essential (primary) hypertension: Secondary | ICD-10-CM | POA: Diagnosis not present

## 2019-12-19 DIAGNOSIS — I480 Paroxysmal atrial fibrillation: Secondary | ICD-10-CM | POA: Diagnosis present

## 2019-12-19 DIAGNOSIS — Z8616 Personal history of COVID-19: Secondary | ICD-10-CM

## 2019-12-19 DIAGNOSIS — Z20822 Contact with and (suspected) exposure to covid-19: Secondary | ICD-10-CM | POA: Diagnosis present

## 2019-12-19 DIAGNOSIS — Z95810 Presence of automatic (implantable) cardiac defibrillator: Secondary | ICD-10-CM | POA: Diagnosis not present

## 2019-12-19 DIAGNOSIS — D8685 Sarcoid myocarditis: Secondary | ICD-10-CM | POA: Diagnosis present

## 2019-12-19 DIAGNOSIS — B9562 Methicillin resistant Staphylococcus aureus infection as the cause of diseases classified elsewhere: Secondary | ICD-10-CM | POA: Diagnosis present

## 2019-12-19 DIAGNOSIS — S31104A Unspecified open wound of abdominal wall, left lower quadrant without penetration into peritoneal cavity, initial encounter: Secondary | ICD-10-CM

## 2019-12-19 DIAGNOSIS — I5022 Chronic systolic (congestive) heart failure: Secondary | ICD-10-CM | POA: Diagnosis present

## 2019-12-19 DIAGNOSIS — Z888 Allergy status to other drugs, medicaments and biological substances status: Secondary | ICD-10-CM

## 2019-12-19 DIAGNOSIS — R42 Dizziness and giddiness: Secondary | ICD-10-CM | POA: Diagnosis present

## 2019-12-19 DIAGNOSIS — I351 Nonrheumatic aortic (valve) insufficiency: Secondary | ICD-10-CM | POA: Diagnosis present

## 2019-12-19 DIAGNOSIS — I428 Other cardiomyopathies: Secondary | ICD-10-CM | POA: Diagnosis present

## 2019-12-19 DIAGNOSIS — A4902 Methicillin resistant Staphylococcus aureus infection, unspecified site: Secondary | ICD-10-CM

## 2019-12-19 DIAGNOSIS — I442 Atrioventricular block, complete: Secondary | ICD-10-CM

## 2019-12-19 DIAGNOSIS — Z452 Encounter for adjustment and management of vascular access device: Secondary | ICD-10-CM

## 2019-12-19 DIAGNOSIS — T8149XA Infection following a procedure, other surgical site, initial encounter: Secondary | ICD-10-CM | POA: Diagnosis not present

## 2019-12-19 DIAGNOSIS — Z882 Allergy status to sulfonamides status: Secondary | ICD-10-CM

## 2019-12-19 DIAGNOSIS — S301XXA Contusion of abdominal wall, initial encounter: Secondary | ICD-10-CM | POA: Diagnosis present

## 2019-12-19 DIAGNOSIS — Z7982 Long term (current) use of aspirin: Secondary | ICD-10-CM

## 2019-12-19 DIAGNOSIS — Z791 Long term (current) use of non-steroidal anti-inflammatories (NSAID): Secondary | ICD-10-CM | POA: Diagnosis not present

## 2019-12-19 DIAGNOSIS — E7800 Pure hypercholesterolemia, unspecified: Secondary | ICD-10-CM | POA: Diagnosis present

## 2019-12-19 DIAGNOSIS — J45909 Unspecified asthma, uncomplicated: Secondary | ICD-10-CM | POA: Diagnosis present

## 2019-12-19 DIAGNOSIS — I071 Rheumatic tricuspid insufficiency: Secondary | ICD-10-CM | POA: Diagnosis present

## 2019-12-19 DIAGNOSIS — Z79891 Long term (current) use of opiate analgesic: Secondary | ICD-10-CM

## 2019-12-19 DIAGNOSIS — Z800 Family history of malignant neoplasm of digestive organs: Secondary | ICD-10-CM

## 2019-12-19 DIAGNOSIS — T8141XA Infection following a procedure, superficial incisional surgical site, initial encounter: Secondary | ICD-10-CM | POA: Diagnosis present

## 2019-12-19 DIAGNOSIS — F419 Anxiety disorder, unspecified: Secondary | ICD-10-CM | POA: Diagnosis present

## 2019-12-19 DIAGNOSIS — E785 Hyperlipidemia, unspecified: Secondary | ICD-10-CM | POA: Diagnosis present

## 2019-12-19 DIAGNOSIS — Z820 Family history of epilepsy and other diseases of the nervous system: Secondary | ICD-10-CM

## 2019-12-19 LAB — BASIC METABOLIC PANEL
Anion gap: 12 (ref 5–15)
BUN: 22 mg/dL — ABNORMAL HIGH (ref 6–20)
CO2: 25 mmol/L (ref 22–32)
Calcium: 9.1 mg/dL (ref 8.9–10.3)
Chloride: 104 mmol/L (ref 98–111)
Creatinine, Ser: 0.85 mg/dL (ref 0.44–1.00)
GFR calc Af Amer: >60 mL/min (ref >60)
GFR calc non Af Amer: >60 mL/min (ref >60)
Glucose, Bld: 112 mg/dL — ABNORMAL HIGH (ref 70–99)
Potassium: 4 mmol/L (ref 3.5–5.1)
Sodium: 141 mmol/L (ref 135–145)

## 2019-12-19 LAB — CUP PACEART INCLINIC DEVICE CHECK
Battery Remaining Longevity: 89 mo
Battery Voltage: 3.08 V
Brady Statistic AP VP Percent: 11.62 %
Brady Statistic AP VS Percent: 0.2 %
Brady Statistic AS VP Percent: 68.41 %
Brady Statistic AS VS Percent: 19.77 %
Brady Statistic RA Percent Paced: 10.95 %
Brady Statistic RV Percent Paced: 73.98 %
Date Time Interrogation Session: 20210810100322
HighPow Impedance: 59 Ohm
Implantable Lead Implant Date: 20201005
Implantable Lead Implant Date: 20201005
Implantable Lead Implant Date: 20210729
Implantable Lead Location: 753859
Implantable Lead Location: 753860
Implantable Lead Location: 753860
Implantable Lead Model: 3830
Implantable Lead Model: 5076
Implantable Lead Model: 6935
Implantable Pulse Generator Implant Date: 20210729
Lead Channel Impedance Value: 228 Ohm
Lead Channel Impedance Value: 247 Ohm
Lead Channel Impedance Value: 399 Ohm
Lead Channel Impedance Value: 399 Ohm
Lead Channel Impedance Value: 475 Ohm
Lead Channel Impedance Value: 551 Ohm
Lead Channel Pacing Threshold Amplitude: 0.5 V
Lead Channel Pacing Threshold Amplitude: 0.75 V
Lead Channel Pacing Threshold Amplitude: 1 V
Lead Channel Pacing Threshold Pulse Width: 0.4 ms
Lead Channel Pacing Threshold Pulse Width: 0.4 ms
Lead Channel Pacing Threshold Pulse Width: 0.4 ms
Lead Channel Sensing Intrinsic Amplitude: 2.125 mV
Lead Channel Sensing Intrinsic Amplitude: 4.25 mV
Lead Channel Setting Pacing Amplitude: 2 V
Lead Channel Setting Pacing Amplitude: 2.5 V
Lead Channel Setting Pacing Amplitude: 3.5 V
Lead Channel Setting Pacing Pulse Width: 0.4 ms
Lead Channel Setting Pacing Pulse Width: 0.4 ms
Lead Channel Setting Sensing Sensitivity: 0.3 mV

## 2019-12-19 LAB — URINALYSIS, ROUTINE W REFLEX MICROSCOPIC
Bacteria, UA: NONE SEEN
Bilirubin Urine: NEGATIVE
Glucose, UA: >=500 mg/dL — AB
Hgb urine dipstick: NEGATIVE
Ketones, ur: NEGATIVE mg/dL
Nitrite: NEGATIVE
Protein, ur: NEGATIVE mg/dL
Specific Gravity, Urine: 1.032 — ABNORMAL HIGH (ref 1.005–1.030)
pH: 6 (ref 5.0–8.0)

## 2019-12-19 LAB — GLUCOSE, CAPILLARY: Glucose-Capillary: 216 mg/dL — ABNORMAL HIGH (ref 70–99)

## 2019-12-19 LAB — CBC
HCT: 45.7 % (ref 36.0–46.0)
Hemoglobin: 13.7 g/dL (ref 12.0–15.0)
MCH: 27.2 pg (ref 26.0–34.0)
MCHC: 30 g/dL (ref 30.0–36.0)
MCV: 90.9 fL (ref 80.0–100.0)
Platelets: 313 10*3/uL (ref 150–400)
RBC: 5.03 MIL/uL (ref 3.87–5.11)
RDW: 17.2 % — ABNORMAL HIGH (ref 11.5–15.5)
WBC: 6.5 10*3/uL (ref 4.0–10.5)

## 2019-12-19 LAB — SARS CORONAVIRUS 2 BY RT PCR (HOSPITAL ORDER, PERFORMED IN ~~LOC~~ HOSPITAL LAB): SARS Coronavirus 2: NEGATIVE

## 2019-12-19 LAB — MAGNESIUM: Magnesium: 1.8 mg/dL (ref 1.7–2.4)

## 2019-12-19 LAB — DIGOXIN LEVEL: Digoxin Level: 0.3 ng/mL — ABNORMAL LOW (ref 0.8–2.0)

## 2019-12-19 MED ORDER — GABAPENTIN 600 MG PO TABS
600.0000 mg | ORAL_TABLET | Freq: Two times a day (BID) | ORAL | Status: DC
  Administered 2019-12-19 – 2019-12-27 (×17): 600 mg via ORAL
  Filled 2019-12-19 (×18): qty 1

## 2019-12-19 MED ORDER — VITAMIN D 25 MCG (1000 UNIT) PO TABS
1000.0000 [IU] | ORAL_TABLET | Freq: Every day | ORAL | Status: DC
  Administered 2019-12-20 – 2019-12-27 (×8): 1000 [IU] via ORAL
  Filled 2019-12-19 (×8): qty 1

## 2019-12-19 MED ORDER — PANTOPRAZOLE SODIUM 40 MG PO TBEC
80.0000 mg | DELAYED_RELEASE_TABLET | Freq: Every day | ORAL | Status: DC
  Administered 2019-12-19 – 2019-12-27 (×9): 80 mg via ORAL
  Filled 2019-12-19 (×9): qty 2

## 2019-12-19 MED ORDER — DAPAGLIFLOZIN PROPANEDIOL 10 MG PO TABS
10.0000 mg | ORAL_TABLET | Freq: Every day | ORAL | Status: DC
  Administered 2019-12-20 – 2019-12-27 (×8): 10 mg via ORAL
  Filled 2019-12-19 (×10): qty 1

## 2019-12-19 MED ORDER — METFORMIN HCL 500 MG PO TABS
500.0000 mg | ORAL_TABLET | Freq: Two times a day (BID) | ORAL | Status: DC
  Administered 2019-12-19 – 2019-12-27 (×16): 500 mg via ORAL
  Filled 2019-12-19 (×16): qty 1

## 2019-12-19 MED ORDER — CARVEDILOL 6.25 MG PO TABS
6.2500 mg | ORAL_TABLET | Freq: Two times a day (BID) | ORAL | Status: DC
  Administered 2019-12-20 – 2019-12-27 (×14): 6.25 mg via ORAL
  Filled 2019-12-19 (×15): qty 1

## 2019-12-19 MED ORDER — SODIUM CHLORIDE 0.9 % IV SOLN: 250.0000 mL | INTRAVENOUS | Status: DC | PRN

## 2019-12-19 MED ORDER — INSULIN ASPART 100 UNIT/ML ~~LOC~~ SOLN
0.0000 [IU] | Freq: Three times a day (TID) | SUBCUTANEOUS | Status: DC
  Administered 2019-12-20: 5 [IU] via SUBCUTANEOUS
  Administered 2019-12-20 (×2): 1 [IU] via SUBCUTANEOUS
  Administered 2019-12-21: 2 [IU] via SUBCUTANEOUS

## 2019-12-19 MED ORDER — PREDNISONE 20 MG PO TABS: 20.0000 mg | ORAL_TABLET | Freq: Every day | ORAL | Status: DC

## 2019-12-19 MED ORDER — PRAVASTATIN SODIUM 10 MG PO TABS
10.0000 mg | ORAL_TABLET | Freq: Every day | ORAL | Status: DC
  Administered 2019-12-20 – 2019-12-26 (×7): 10 mg via ORAL
  Filled 2019-12-19 (×7): qty 1

## 2019-12-19 MED ORDER — ALBUTEROL SULFATE HFA 108 (90 BASE) MCG/ACT IN AERS
1.0000 | INHALATION_SPRAY | Freq: Four times a day (QID) | RESPIRATORY_TRACT | Status: DC | PRN
  Filled 2019-12-19: qty 6.7

## 2019-12-19 MED ORDER — HYDROCODONE-ACETAMINOPHEN 10-325 MG PO TABS
1.0000 | ORAL_TABLET | Freq: Four times a day (QID) | ORAL | Status: DC | PRN
  Administered 2019-12-21 – 2019-12-26 (×7): 1 via ORAL
  Filled 2019-12-19 (×7): qty 1

## 2019-12-19 MED ORDER — LINEZOLID 600 MG PO TABS
600.0000 mg | ORAL_TABLET | Freq: Two times a day (BID) | ORAL | Status: DC
  Administered 2019-12-19 – 2019-12-26 (×15): 600 mg via ORAL
  Filled 2019-12-19 (×16): qty 1

## 2019-12-19 MED ORDER — SODIUM CHLORIDE 0.9% FLUSH: 3.0000 mL | Freq: Once | INTRAVENOUS | Status: DC

## 2019-12-19 MED ORDER — PREDNISONE 20 MG PO TABS: 40.0000 mg | ORAL_TABLET | Freq: Every day | ORAL | Status: DC

## 2019-12-19 MED ORDER — SODIUM CHLORIDE 0.9% FLUSH
3.0000 mL | INTRAVENOUS | Status: DC | PRN
  Administered 2019-12-26: 3 mL via INTRAVENOUS

## 2019-12-19 MED ORDER — SPIRONOLACTONE 25 MG PO TABS
25.0000 mg | ORAL_TABLET | Freq: Every day | ORAL | Status: DC
  Administered 2019-12-21 – 2019-12-27 (×7): 25 mg via ORAL
  Filled 2019-12-19 (×9): qty 1

## 2019-12-19 MED ORDER — ONDANSETRON HCL 4 MG PO TABS: 4.0000 mg | ORAL_TABLET | Freq: Three times a day (TID) | ORAL | Status: DC | PRN

## 2019-12-19 MED ORDER — SACUBITRIL-VALSARTAN 49-51 MG PO TABS
1.0000 | ORAL_TABLET | Freq: Two times a day (BID) | ORAL | Status: DC
  Administered 2019-12-19 – 2019-12-27 (×14): 1 via ORAL
  Filled 2019-12-19 (×19): qty 1

## 2019-12-19 MED ORDER — SODIUM CHLORIDE 0.9% FLUSH
3.0000 mL | Freq: Two times a day (BID) | INTRAVENOUS | Status: DC
  Administered 2019-12-19 – 2019-12-27 (×12): 3 mL via INTRAVENOUS

## 2019-12-19 MED ORDER — ENOXAPARIN SODIUM 40 MG/0.4ML ~~LOC~~ SOLN
40.0000 mg | SUBCUTANEOUS | Status: DC
  Administered 2019-12-19 – 2019-12-20 (×2): 40 mg via SUBCUTANEOUS
  Filled 2019-12-19 (×2): qty 0.4

## 2019-12-19 MED ORDER — PREDNISONE 20 MG PO TABS
40.0000 mg | ORAL_TABLET | Freq: Once | ORAL | Status: AC
  Administered 2019-12-19: 40 mg via ORAL
  Filled 2019-12-19: qty 2

## 2019-12-19 MED ORDER — LORATADINE 10 MG PO TABS
10.0000 mg | ORAL_TABLET | Freq: Every day | ORAL | Status: DC
  Administered 2019-12-20 – 2019-12-27 (×8): 10 mg via ORAL
  Filled 2019-12-19 (×9): qty 1

## 2019-12-19 MED ORDER — DIGOXIN 125 MCG PO TABS
0.1250 mg | ORAL_TABLET | Freq: Every day | ORAL | Status: DC
  Administered 2019-12-20 – 2019-12-27 (×8): 0.125 mg via ORAL
  Filled 2019-12-19 (×9): qty 1

## 2019-12-19 MED ORDER — MAGNESIUM SULFATE 2 GM/50ML IV SOLN
2.0000 g | Freq: Once | INTRAVENOUS | Status: AC
  Administered 2019-12-19: 2 g via INTRAVENOUS
  Filled 2019-12-19: qty 50

## 2019-12-19 MED ORDER — ACETAMINOPHEN 325 MG PO TABS
650.0000 mg | ORAL_TABLET | ORAL | Status: DC | PRN
  Administered 2019-12-20 – 2019-12-22 (×3): 650 mg via ORAL
  Filled 2019-12-19 (×3): qty 2

## 2019-12-19 MED ORDER — ASPIRIN EC 81 MG PO TBEC
81.0000 mg | DELAYED_RELEASE_TABLET | Freq: Every day | ORAL | Status: DC
  Administered 2019-12-19 – 2019-12-22 (×4): 81 mg via ORAL
  Filled 2019-12-19 (×5): qty 1

## 2019-12-19 MED ORDER — MEXILETINE HCL 150 MG PO CAPS
150.0000 mg | ORAL_CAPSULE | Freq: Two times a day (BID) | ORAL | Status: DC
  Administered 2019-12-19 (×2): 150 mg via ORAL
  Filled 2019-12-19 (×3): qty 1

## 2019-12-19 MED ORDER — BIOTIN 10 MG PO CAPS: 10.0000 mg | ORAL_CAPSULE | Freq: Every day | ORAL | Status: DC

## 2019-12-19 MED ORDER — ADULT MULTIVITAMIN W/MINERALS CH
1.0000 | ORAL_TABLET | Freq: Every day | ORAL | Status: DC
  Administered 2019-12-20 – 2019-12-27 (×8): 1 via ORAL
  Filled 2019-12-19 (×8): qty 1

## 2019-12-19 MED ORDER — METFORMIN HCL 500 MG PO TABS: 500.0000 mg | ORAL_TABLET | Freq: Two times a day (BID) | ORAL | Status: DC

## 2019-12-19 MED ORDER — ONDANSETRON 4 MG PO TBDP
4.0000 mg | ORAL_TABLET | Freq: Three times a day (TID) | ORAL | Status: DC | PRN
  Filled 2019-12-19: qty 1

## 2019-12-19 MED ORDER — INSULIN ASPART 100 UNIT/ML ~~LOC~~ SOLN
0.0000 [IU] | Freq: Every day | SUBCUTANEOUS | Status: DC
  Administered 2019-12-19: 2 [IU] via SUBCUTANEOUS

## 2019-12-19 MED ORDER — ONDANSETRON HCL 4 MG/2ML IJ SOLN: 4.0000 mg | Freq: Four times a day (QID) | INTRAMUSCULAR | Status: DC | PRN

## 2019-12-19 MED ORDER — ALBUTEROL SULFATE (2.5 MG/3ML) 0.083% IN NEBU: 2.5000 mg | INHALATION_SOLUTION | Freq: Four times a day (QID) | RESPIRATORY_TRACT | Status: DC | PRN

## 2019-12-19 NOTE — Progress Notes (Signed)
Wound check appointment. Steri-strips removed. Wound without redness or edema. Incision edges approximated, stitch removed from medial incision. Wound otherwise well healed. Patient educated about signs/symptoms of infection, aware to call if any noted. Normal device function. Thresholds, sensing, and impedances consistent with implant measurements. RA and LV (His) outputs at chronic settings S/P BiV upgrade. RV output at 3.5V with autocapture on for extra safety margin until 3 month visit. Histogram distribution suggests ~20% of ventricular HRs in 130-160bpm range. BiVP 74.0%, likely due to VT/PVCs. No mode switches. 6 FVT episodes and 2 VT episodes since 12/16/19, all treated with 1-2 bursts of ATP, some episodes ongoing below detection after ATP delivered. 214 monitored VT episodes, 90 NSVT episodes. Longest V sensing episode 2hr 60min, markers suggest VT. Most recent treated episode this AM, time stamps inaccurate due to incorrect programmed device time (updated today). Patient reports frequent episodes of dizziness and palpitations in recent days. On carvedilol, digoxin, and mexiletine. Discussed with Dr. Allred/Dr. Lovena Le, patient to present to [facility] ED for further workup. Patient declines transport by EMS, escorted to private vehicle. Patient's husband aware of plan and will drive her straight to [facility] ED. Device alerts adjusted per protocol. Patient educated about wound care, arm mobility, lifting restrictions, shock plan, and Carelink monitor. Carelink on 03/08/20, ROV with Jens Som, PA-C on 12/28/19.

## 2019-12-19 NOTE — Consult Note (Addendum)
Advanced Heart Failure Team Consult Note   Primary Physician: Milford Cage, PA PCP-Cardiologist: Dr. Aundra Dubin EP: Dr. Lovena Le   Reason for Consultation: Chronic Systolic Heart Failure, Presumed Cardiac Sarcoidosis   HPI:    Lisa Poole is seen today for evaluation of chronic systolic heart failure at the request of Dr. Quentin Ore, EP.   54 y/o female w/ h/o HTN, HLD, T2DM, CHB s/p PPM implant 02/2019 followed by Dr. Lovena Le, PAF (not on a/c), aortic insufficiency,  recent COVID infection in April 2021 and recent diagnosis of systolic heart failure/ nonischemic CM.   In May 2020, she underwent w/u for SOB and mildly elevated troponin I (0.09>>0.10>>0.12>>0.15). LHC 09/2018 showed no significant coronary disease.Therewas minimal systolic bridging of 5 to 10% in the mid LAD. The left circumflex vesselwas normal. The RCA is a dominant vessel with a superior takeoff andwas normal.Echo 09/2018. LVEF 60-65%, mild LVH, RV normal. Mild- mod AI. No stenosis.  In 02/2019, she presented w/ chest pain and found to be in CHB and underwentPPMinplant ([employer]). She is followed by Dr. Lovena Le. EP notes outline detection of PAF but episodes have been very brief, thus she has not been on anticoagulation.   In April 2021, she was diagnosed w/ COVID but did not require hospitalization. She has not been vaccinated.   [facility] of progressive dyspnea, intermittent CP, orthopnea, PND and LEE. Symptoms present for several weeks but worsened over the last 2 days, progressing to symptoms at rest.   In ED, found to be in acute CHF.BNP 1,258. CXR w/ developing edema.HS troponin 32>>52>>67.EKG and tele w/ WCT, felt to be VT. Rates in the 160s-170s. BP has been soft but stable. Started on IV amiodarone in ED. Initial K 4.2>>3.2 today. Mg 1.7. SCr 1.37>>1.18.Started on lasix gtt.  Echowasrepeated and showednew drop in LVEF, down to 20-25% w/ regional  wall motion abnormalties,most prominent in anterior, septal, and lateral walls. Moderate to severe TR with elevated RVSP. Moderate MR. LV thrombus excluded by definity contrast. RV systolic function normal.  She developed worsening HF/ cardiogenic shock w/ low Co-ox in the 40s, required inotropic support + placement of IABP. IV amiodarone started for VT suppression. Developed Torsades/VT x 3 on 7/24 with prolonged QT required DC-CV. Amio switched to lido. Lido stopped 7/26 by Dr. Caryl Comes. Started on mexiletine. Had repeat LHC that showed no significant coronary disease. cMRI c/w possible cardiac sarcoidosis vs prior myocarditis. She was placed on empiric prednisone for possible sarcoid w/ plans to refer to [facility] for cardiac PET scan. Of note, chest CT 7/21 with ground glass lower lobes but no evidence for pulmonary sarcoidosis.   She was ultimately weaned off intropes and IABP. Rhythm stabilized w/o further recurrence. Continued on Mexiletine for maintenance therapy. She underwent device upgraed to CRT-D prior to d/c.  She was seen in [facility] for post hospital f/u on 8/6 and was doing well from cardiac standpoint. NYHA Class II and euvolemic. Denied palpitations. VSS on HF regimen. Coreg was increased to 6.25 mg bid. She was noted to have rt groin opening w/ serous drainage and previous IABP site. Wound was cultured and grew MSRSA. She was started on linezolid and referral placed to f/u w/ ID. Prednisone was also wean ned down to 10 mg daily given concerns it was contributing to poor wound healing.   For the last several days she has felt poorly w/ intermittent dizziness and palpitations. Had device check today in EP clinic. Device  was interrogated and histogram distribution suggests ~20% of ventricular HRs in 130-160bpm range. BiVP 74.0%, likely due to VT/PVCs. No mode switches. 6 FVT episodes and 2 VT episodes since 12/16/19, all treated with 1-2 bursts of ATP, some episodes ongoing below detection after ATP  delivered. 214 monitored VT episodes, 90 NSVT episodes. Longest V sensing episode 2hr 46mn, markers suggest VT. She was sent to the ED for further w/u and admission by hospital EP team.  It is felt that her recurrence of VT likely triggered by underlying cardiac sarcoidosis and recent reduction in prednisone dose. VSS in the ED. Labs unremarkable. WBC 6.5. K 4.0. SCr 0.85. Mg level pending. Digoxin level ok 0.3.   ID also now following given rt groin infection. CT of A/P shows near complete resolution of right groin hemorrhage since prior exam. No evidence of acute inflammatory process, abscess, or mass. She is afebrile.    Review of Systems: [y] = yes, _0  = no   . General: Weight gain _1 ; Weight loss _2 ; Anorexia _3 ; Fatigue _4 ; Fever _5 ; Chills _6 ; Weakness _7   . Cardiac: Chest pain/pressure _8 ; Resting SOB _9 ; Exertional SOB _10 ; Orthopnea _11 ; Pedal Edema _12 ; Palpitations [Y ]; Syncope _13 ; Presyncope [Y]; Paroxysmal nocturnal dyspnea_14   . Pulmonary: Cough _15 ; Wheezing_16 ; Hemoptysis_17 ; Sputum _18 ; Snoring _19   . GI: Vomiting_20 ; Dysphagia_21 ; Melena_22 ; Hematochezia _23 ; Heartburn_24 ; Abdominal pain _25 ; Constipation _26 ; Diarrhea _27 ; BRBPR _28   . GU: Hematuria_29 ; Dysuria _30 ; Nocturia_31   . Vascular: Pain in legs with walking _32 ; Pain in feet with lying flat _33 ; Non-healing sores _34 ; Stroke _35 ; TIA _36 ; Slurred speech _37 ;  . Neuro: Headaches_38 ; Vertigo_39 ; Seizures_40 ; Paresthesias_41 ;Blurred vision _42 ; Diplopia _43 ; Vision changes _44   . Ortho/Skin: Arthritis _45 ; Joint pain _46 ; Muscle pain _47 ; Joint swelling _48 ; Back Pain _49 ; Rash _50   . Psych: Depression_51 ; Anxiety_52   . Heme: Bleeding problems _53 ; Clotting disorders _54 ; Anemia _55   . Endocrine: Diabetes _56 ; Thyroid dysfunction_57   Home Medications Prior to Admission medications   Medication Sig Start Date End Date Taking? Authorizing Provider  albuterol (PROVENTIL HFA;VENTOLIN HFA) 108 (90 Base)  MCG/ACT inhaler Inhale 1-2 puffs into the lungs every 6 (six) hours as needed for wheezing or shortness of breath. 07/30/18  Yes YDrenda Freeze MD  aspirin EC 81 MG tablet Take 81 mg by mouth daily. Swallow whole.   Yes [provider]  Biotin 10 MG CAPS Take 10 mg by mouth daily.   Yes [provider]  carvedilol (COREG) 6.25 MG tablet Take 1 tablet (6.25 mg total) by mouth 2 (two) times daily with a meal. 12/15/19  Yes MLarey Dresser MD  cetirizine (ZYRTEC) 10 MG tablet Take 10 mg by mouth daily.   Yes [provider]  Cholecalciferol (VITAMIN D-3) 25 MCG (1000 UT) CAPS Take 1,000 Units by mouth daily.    Yes [provider]  dapagliflozin propanediol (FARXIGA) 10 MG TABS tablet Take 1 tablet (10 mg total) by mouth daily. 12/08/19  Yes MLarey Dresser MD  digoxin (LANOXIN) 0.125 MG tablet Take 1 tablet (0.125 mg total) by mouth daily. 12/09/19  Yes MLarey Dresser MD  gabapentin (NEURONTIN) 600 MG tablet  Take 600 mg by mouth 2 (two) times daily.   Yes [provider]  HYDROcodone-acetaminophen (NORCO) 10-325 MG tablet Take 1 tablet by mouth 4 (four) times daily.    Yes [provider]  linezolid (ZYVOX) 600 MG tablet Take 1 tablet (600 mg total) by mouth 2 (two) times daily for 10 days. 12/17/19 12/27/19 Yes Sande Rives E, PA-C  meloxicam (MOBIC) 15 MG tablet Take 15 mg by mouth daily. 02/05/19  Yes [provider]  metFORMIN (GLUCOPHAGE) 500 MG tablet Take 500 mg by mouth 2 (two) times daily. 07/26/19  Yes [provider]  mexiletine (MEXITIL) 150 MG capsule Take 1 capsule (150 mg total) by mouth every 12 (twelve) hours. 12/08/19  Yes Larey Dresser, MD  Multiple Vitamins-Minerals (CENTRUM SILVER 50+WOMEN) TABS Take 1 tablet by mouth daily.   Yes [provider]  naloxone (NARCAN) 4 MG/0.1ML LIQD nasal spray kit Place 1 spray into the nose as directed.   Yes [provider]  omeprazole (PRILOSEC) 40  MG capsule Take 40 mg by mouth daily.   Yes [provider]  ondansetron (ZOFRAN ODT) 4 MG disintegrating tablet Take 1 tablet (4 mg total) by mouth every 8 (eight) hours as needed for nausea or vomiting. 10/20/19  Yes Lucrezia Starch, MD  pravastatin (PRAVACHOL) 10 MG tablet Take 1 tablet (10 mg total) by mouth daily at 6 PM. 12/08/19  Yes Lyda Jester M, PA-C  predniSONE (DELTASONE) 10 MG tablet Take 10 mg by mouth daily with breakfast.   Yes [provider]  sacubitril-valsartan (ENTRESTO) 49-51 MG Take 1 tablet by mouth 2 (two) times daily. 12/08/19  Yes Larey Dresser, MD  spironolactone (ALDACTONE) 25 MG tablet Take 1 tablet (25 mg total) by mouth daily. 12/09/19  Yes Larey Dresser, MD  venlafaxine XR (EFFEXOR XR) 150 MG 24 hr capsule Take 1 capsule (150 mg total) by mouth daily with breakfast. 12/18/19  Yes Larey Dresser, MD  ACCU-CHEK AVIVA PLUS test strip 3 (three) times daily. 11/24/19   [provider]  predniSONE (DELTASONE) 20 MG tablet Take 0.5 tablets (10 mg total) by mouth daily with breakfast. Patient not taking: Reported on 12/19/2019 12/18/19   Consuelo Pandy, PA-C    Past Medical History: Past Medical History:  Diagnosis Date  . Aortic insufficiency   . Asthma 09/13/2006  . Complete heart block ([place]) 02/11/2019  . Cystocele without uterine prolapse 12/12/2018  . Diabetes mellitus without complication ([place])   . Elevated troponin level   . Hypercholesteremia   . Hypertension   . Primary osteoarthritis of left knee 12/10/2017    Past Surgical History: Past Surgical History:  Procedure Laterality Date  . APPENDECTOMY    . BIV ICD INSERTION CRT-D N/A 12/07/2019   Procedure: BIV ICD INSERTION CRT-D;  Surgeon: Evans Lance, MD;  Location: [place] [facility];  Service: Cardiovascular;  Laterality: N/A;  . IABP INSERTION N/A 12/01/2019   Procedure: IABP Insertion;  Surgeon: Larey Dresser, MD;  Location: [place] [facility];  Service:  Cardiovascular;  Laterality: N/A;  . LEFT HEART CATH AND CORONARY ANGIOGRAPHY N/A 09/28/2018   Procedure: LEFT HEART CATH AND CORONARY ANGIOGRAPHY;  Surgeon: Troy Sine, MD;  Location: [place] [facility];  Service: Cardiovascular;  Laterality: N/A;  . PACEMAKER IMPLANT N/A 02/13/2019   Procedure: PACEMAKER IMPLANT;  Surgeon: Evans Lance, MD;  Location: [place] [facility];  Service: Cardiovascular;  Laterality: N/A;  . PACEMAKER  IMPLANT    . RIGHT/LEFT HEART CATH AND CORONARY ANGIOGRAPHY N/A 12/01/2019   Procedure: RIGHT/LEFT HEART CATH AND CORONARY ANGIOGRAPHY;  Surgeon: Larey Dresser, MD;  Location: [place] [facility];  Service: Cardiovascular;  Laterality: N/A;  . ROTATOR CUFF REPAIR Bilateral 2017  . SINUS EXPLORATION    . TOTAL KNEE ARTHROPLASTY Left 12/10/2017   Procedure: LEFT TOTAL KNEE ARTHROPLASTY;  Surgeon: Sydnee Cabal, MD;  Location: [facility];  Service: Orthopedics;  Laterality: Left;  Adductor Block  . TUBAL LIGATION      Family History: Family History  Problem Relation Age of Onset  . Colon cancer Maternal Grandmother   . Alzheimer's disease Mother     Social History: Social History   Socioeconomic History  . Marital status: Married    Spouse name: Not on file  . Number of children: Not on file  . Years of education: Not on file  . Highest education level: Not on file  Occupational History  . Not on file  Tobacco Use  . Smoking status: Never Smoker  . Smokeless tobacco: Never Used  Vaping Use  . Vaping Use: Never used  Substance and Sexual Activity  . Alcohol use: No  . Drug use: No  . Sexual activity: Yes    Birth control/protection: Post-menopausal  Other Topics Concern  . Not on file  Social History Narrative  . Not on file   Social Determinants of Health   Financial Resource Strain:   . Difficulty of Paying Living Expenses:   Food Insecurity:   . Worried About Charity fundraiser in the Last Year:   . Arboriculturist in the Last Year:     Transportation Needs:   . Film/video editor (Medical):   Marland Kitchen Lack of Transportation (Non-Medical):   Physical Activity:   . Days of Exercise per Week:   . Minutes of Exercise per Session:   Stress:   . Feeling of Stress :   Social Connections:   . Frequency of Communication with Friends and Family:   . Frequency of Social Gatherings with Friends and Family:   . Attends Religious Services:   . Active Member of Clubs or Organizations:   . Attends Archivist Meetings:   Marland Kitchen Marital Status:     Allergies:  Allergies  Allergen Reactions  . Lisinopril Other (See Comments) and Cough    Flu like symptoms, also   . Ace Inhibitors Other (See Comments)    Flu-like symptoms  . Augmentin [Amoxicillin-Pot Clavulanate] Nausea And Vomiting  . Benazepril Other (See Comments)    Flu like symptoms  . Oxycodone-Acetaminophen Nausea And Vomiting  . Sulfa Antibiotics Nausea And Vomiting    Objective:    Vital Signs:   Temp:  [98.2 F (36.8 C)] 98.2 F (36.8 C) (08/10 1035) Pulse Rate:  [84-99] 99 (08/10 1313) Resp:  [13-18] 13 (08/10 1313) BP: (96-172)/(69-82) 102/72 (08/10 1313) SpO2:  [98 %-100 %] 100 % (08/10 1313) Weight:  [77.1 kg] 77.1 kg (08/10 1035)    Weight change: Filed Weights   12/19/19 1035  Weight: 77.1 kg    Intake/Output:  No intake or output data in the 24 hours ending 12/19/19 1606    Physical Exam    General:  Well appearing, moderately obese. No resp difficulty HEENT: normal Neck: supple. JVP . Carotids 2+ bilat; no bruits. No lymphadenopathy or thyromegaly appreciated. Cor: PMI nondisplaced. Regular rate & rhythm. No rubs, gallops or murmurs. Lungs: clear Abdomen:  soft, nontender, nondistended. No hepatosplenomegaly. No bruits or masses. Good bowel sounds. Extremities: no cyanosis, clubbing, rash, edema Rt Groin: small open wound + creamy white exudate. No erythema   Neuro: alert & orientedx3, cranial nerves grossly intact. moves all 4  extremities w/o difficulty. Affect pleasant   Telemetry   NSR w/ PVCs, 90s   EKG    No new EKG to review.   Labs   Basic Metabolic Panel: Recent Labs  Lab 12/15/19 1009 12/19/19 1135  NA 140 141  K 4.3 4.0  CL 104 104  CO2 24 25  GLUCOSE 141* 112*  BUN 16 22*  CREATININE 0.82 0.85  CALCIUM 9.5 9.1  MG  --  1.8    Liver Function Tests: No results for input(s): AST, ALT, ALKPHOS, BILITOT, PROT, ALBUMIN in the last 168 hours. No results for input(s): LIPASE, AMYLASE in the last 168 hours. No results for input(s): AMMONIA in the last 168 hours.  CBC: Recent Labs  Lab 12/15/19 1009 12/19/19 1135  WBC 12.5* 6.5  HGB 13.6 13.7  HCT 46.3* 45.7  MCV 90.6 90.9  PLT 449* 313    Cardiac Enzymes: No results for input(s): CKTOTAL, CKMB, CKMBINDEX, TROPONINI in the last 168 hours.  BNP: BNP (last 3 results) Recent Labs    11/26/19 2011  BNP 1,258.1*    ProBNP (last 3 results) No results for input(s): PROBNP in the last 8760 hours.   CBG: No results for input(s): GLUCAP in the last 168 hours.  Coagulation Studies: No results for input(s): LABPROT, INR in the last 72 hours.   Imaging   CT ABDOMEN PELVIS WO CONTRAST  Result Date: 12/19/2019 CLINICAL DATA:  Right groin pain, erythema, and drainage. Recent removal of intra-aortic balloon pump. Suspect soft tissue infection. EXAM: CT ABDOMEN AND PELVIS WITHOUT CONTRAST TECHNIQUE: Multidetector CT imaging of the abdomen and pelvis was performed following the standard protocol without IV contrast. COMPARISON:  12/04/2019 FINDINGS: Lower chest: No acute findings. Hepatobiliary: No mass visualized on this unenhanced exam. Gallbladder is unremarkable. No evidence of biliary ductal dilatation. Pancreas: No mass or inflammatory process visualized on this unenhanced exam. Spleen:  Within normal limits in size. Adrenals/Urinary tract: No evidence of urolithiasis or hydronephrosis. Unremarkable unopacified urinary bladder.  Stomach/Bowel: No evidence of obstruction, inflammatory process, or abnormal fluid collections. Diffuse colonic diverticulosis is again noted, however there is no evidence of diverticulitis. Vascular/Lymphatic: No pathologically enlarged lymph nodes identified. No evidence of abdominal aortic aneurysm. Aortic atherosclerosis noted. Reproductive:  No mass or other significant abnormality. Other: Hemorrhage in the subcutaneous tissues of the right groin nearly completely resolved since previous study. No evidence of acute inflammatory process, abscess, or soft tissue gas. No evidence of hernia or mass. Musculoskeletal: No suspicious bone lesions identified. Old bilateral pubic fracture deformities again noted. IMPRESSION: Near complete resolution of right groin hemorrhage since prior exam. No evidence of acute inflammatory process, abscess, or mass. Colonic diverticulosis. No radiographic evidence of diverticulitis. Aortic Atherosclerosis (ICD10-I70.0). Electronically Signed   By: Marlaine Hind M.D.   On: 12/19/2019 15:58   DG Chest 2 View  Result Date: 12/19/2019 CLINICAL DATA:  Weakness.  Cardiac arrhythmia EXAM: CHEST - 2 VIEW COMPARISON:  December 08, 2019 FINDINGS: Pacemaker present with stable positioning of lead tips. Heart size and pulmonary vascularity are normal. Lungs are clear. There is no adenopathy. Note that there are nonenlarged calcified lymph nodes in the left hilum and aortopulmonary window regions. No bone lesions. IMPRESSION: Calcified lymph nodes consistent with  prior granulomatous disease. No edema or airspace opacity. Stable cardiac silhouette and pacemaker lead positioning. Electronically Signed   By: Lowella Grip III M.D.   On: 12/19/2019 11:22   CUP PACEART INCLINIC DEVICE CHECK  Result Date: 12/19/2019 Wound check appointment. Steri-strips removed. Wound without redness or edema. Incision edges approximated, stitch removed from medial incision. Wound otherwise well healed. Patient  educated about signs/symptoms of infection, aware to call if any noted. Normal device function. Thresholds, sensing, and impedances consistent with implant measurements. RA and LV (His) outputs at chronic settings S/P BiV upgrade. RV output at 3.5V with autocapture on for extra safety margin until 3 month visit. Histogram distribution suggests ~20% of ventricular HRs in 130-160bpm range. BiVP 74.0%, likely due to VT/PVCs. No mode switches. 6 FVT episodes and 2 VT episodes since 12/16/19, all treated with 1-2 bursts of ATP, some episodes ongoing below detection after ATP delivered. 214 monitored VT episodes, 90 NSVT episodes. Longest V sensing episode 2hr 40mn, markers suggest VT. Most recent treated episode this AM, time stamps inaccurate due to incorrect programmed device time (updated today). Patient reports frequent  episodes of dizziness and palpitations in recent days. On carvedilol, digoxin, and mexiletine. Discussed with Dr. Allred/Dr. TLovena Le [facility]. Patient declines transport by EMS, escorted to private vehicle. Patient's husband aware of plan [facility] Device alerts adjusted per protocol. Patient educated about wound care, arm mobility, lifting restrictions, shock plan, and Carelink monitor. Carelink on 03/08/20, ROV with RU on 12/28/19.ELevander CampionBSN, RN, CCDS     Medications:     Current Medications: . aspirin EC  81 mg Oral Daily  . carvedilol  6.25 mg Oral BID WC  . cholecalciferol  1,000 Units Oral Daily  . dapagliflozin propanediol  10 mg Oral Daily  . [START ON 12/20/2019] digoxin  0.125 mg Oral Daily  . enoxaparin (LOVENOX) injection  40 mg Subcutaneous Q24H  . gabapentin  600 mg Oral BID  . insulin aspart  0-5 Units Subcutaneous QHS  . insulin aspart  0-9 Units Subcutaneous TID WC  . linezolid  600 mg Oral BID  . loratadine  10 mg Oral Daily  . metFORMIN  500 mg Oral BID  . mexiletine  150 mg Oral Q12H  .  multivitamin with minerals  1 tablet Oral Daily  . pantoprazole  80 mg Oral Daily  . pravastatin  10 mg Oral q1800  . [START ON 12/20/2019] predniSONE  40 mg Oral Q breakfast  . predniSONE  40 mg Oral Once  . sacubitril-valsartan  1 tablet Oral BID  . sodium chloride flush  3 mL Intravenous Once  . sodium chloride flush  3 mL Intravenous Q12H  . spironolactone  25 mg Oral Daily     Infusions: . sodium chloride    . magnesium sulfate bolus IVPB       Assessment/Plan   1. Recurrent VT - had VT previous hospitalization 7/21 - s/p MDT CRT-D 7/21 - off amio w/ prolonged QT w/ development of torsades - on mexiletine 150 mg bid  - has had recurrence w/ multiple episodes on device interrogation since 8/7 w/ various morphologies. Suspected to have underlying cardiac sarcoidosis. Episodes increased after reduction in prednisone dose  - per EP, increase prednisone back up to 40 mg daily for immunosuppression  - keep K > 4.0 and Mg > 2.0   2. Chronic Systolic Heart Failure: Nonischemic cardiomyopathy. Recent admit  7/21 w/ CGS requiring inotropic support + IABP placement. Echo 7/21 with EF newly decreased to 20-25% with septal-lateral dyssynchrony and septal severe hypokinesis, normal RV, moderate TR, moderate MR. Echo in 5/20 prior to PPM placement showed EF 60-65%. [place] 7/21 showed no significant CAD.Possible causes of cardiomyopathy include chronic RV pacing, frequent NSVT/PVCs, ?viral myocarditis (relatively recent COVID-19 infection). No ETOH/drugs. There is concern for potential inflammatory CM. cMRI 7/27 showed patchy LGE in the basal septum, basal-mid inferoseptum/inferior wall, basal anterior wall, mid anterolateral wall. Cardiac MRI LGE pattern could be consistent with cardiac sarcoidosis (would explain earlier CHB) vs prior viral myocarditis. Referral to [facility] for cardiac PET scan to further evaluate for sarcoid pending.  - NYHA Class II - Euvolemic on exam  - Continue Entresto 49/51 mg  bid (doubt BP will tolerate increase currently) - Continue Spironolactone 25 mg daily  - Continue dapagliflozin 10 mg daily  - Continue Coreg to 6.25 mg bid (HR 90s)  - Continue Digoxin 0.125 mg daily. Dig level ok today (0.3) - Solumedrol started 7/24 for presumed sarcoid>>changed to predinsone =>discharged home on taper w/ plans to continue until her appt at [facility]. Dose was reduced recently on 8/6 down to 10 mg daily given concerns for poor wound healing. Now w/ recurrent VT - Plan to increase prednisone back to 40 mg daily for immunosuppression  - Referral placed to [facility] for cardiac PET scan to exclude cardiac sarcoidosis=>if not suggestive, will stop prednisone. Of note, chest CT 7/21 with ground glass lower lobes but no evidence for pulmonary sarcoidosis.  - Cardiomyopathy likely worsened by frequent VT/PVCs. Now on mexiletine to suppress.  - She is now s/p device upgrade to MDT CRT-D.   3. Rt Groin Wound/ MRSA Infection  - recent hospitalization w/ Rt femoral artery IABP placement c/b right groin hematoma - now w/ MSRA infection at site. AF. WBC normal - CT of A/P today shows near complete resolution of right groin hemorrhage since prior exam. No evidence of acute inflammatory process, abscess, or mass. - continue abx therapy w/ linezolid  - ID following  4. CHB: s/p PPM 2/21>> had device upgrade to MDT CRT-D 7/21 following recurrent VT/ torsades  Length of Stay: 0  Lyda Jester, PA-C  12/19/2019, 4:06 PM  Advanced Heart Failure Team Pager [phone] (M-F; 7a - 4p)  Please contact [facility] for night-coverage after hours (4p -7a ) and weekends on [email or web address]  Patient seen with PA, agree with the above note.   Patient has developed a MRSA wound infection at site of IABP, drainage but no mass or fluctuance.  She was started on linezolid and prednisone was decreased to 10 mg daily.   She has noted intermittent dizziness and palpitations for the last several days.  She was  noted on device interrogation today to have frequent PVCs and NSVT. She has had ATP but no ICD shocks.  Effective BiV pacing has been decreased.  Otherwise, she denies exertional dyspnea.    CT abdomen/pelvis did not show an abscess at the right groin site.   General: NAD Neck: No JVD, no thyromegaly or thyroid nodule.  Lungs: Clear to auscultation bilaterally with normal respiratory effort. CV: Nondisplaced PMI.  Heart regular S1/S2, no S3/S4, no murmur.  No peripheral edema.  No carotid bruit.  Normal pedal pulses.  Abdomen: Soft, nontender, no hepatosplenomegaly, no distention.  Skin: Right groin site without mass/fluctuance, no obvious drainage.  Neurologic: Alert and oriented x 3.  Psych: Normal affect. Extremities: No clubbing  or cyanosis.  HEENT: Normal.   1. Chronic systolic CHF: Nonischemic cardiomyopathy.  Echo last admission in 7/21 with EF newly decreased to 20-25% with septal-lateral dyssynchrony and septal severe hypokinesis, normal RV, moderate TR, moderate MR. Echo in 5/20 prior to PPM placement showed EF 60-65%. Possible causes of cardiomyopathy included cardiac sarcoidosis, chronic RV pacing, frequent NSVT/PVCs, ?CAD (has RFs), ?viral myocarditis (relatively recent COVID-19 infection). No ETOH/drugs. Developed shock overnight 11/28/19 w/ hypotension and narrow pulse pressure, w/ subsequent AKI, SCr 1.18>>1.85>>2.26. Inotropes/pressors limited by ventricular ectopy.  LHC/RHC 7/23 with no significant CAD, elevated filling pressures, and low cardiac output on norepinephrine. IABP placed. IABP removed 7/26. cMRI 7/27 showed patchy LGE in the basal septum, basal-mid inferoseptum/inferior wall, basal anterior wall, mid anterolateral wall. Cardiac MRI LGE pattern could be consistent with cardiac sarcoidosis (would explain earlier CHB).  Alternatively, prior viral myocarditis (recent COVID-19).  CT chest with ground glass lower lobes but no evidence for pulmonary sarcoidosis.  She is now  s/p device upgrade to MDT CRT-D.  This admission, she is not volume overloaded on exam and has NYHA class II symptoms.  I think that her HF is fairly well-compensated, recurrent ventricular arrhythmias appears to be the main issue.  - She does not need a loop diuretic for now.   - Continue digoxin 0.125 daily. Dig level ok - Continue spironolactone 25 mg daily  - Continue Entresto 49/51 mg bid - Continue Coreg 6.25 mg bid.  If she has BP room, would increase given VT.  - Continue dapagliflozin 10 mg daily.  - Solumedrol started 7/24 for presumed sarcoid => now on prednisone 10 mg daily.  With recurrent VT after weaning down on prednisone, will increase back to 40 mg daily (dose now).  - Will need PET [facility] at [facility] or [facility] ASAP for cardiac sarcoidosis.    - Cardiomyopathy likely worsened by frequent VT/PVCs. Now on mexiletine to suppress.  2. Recurrent ventricular arrhythmias: She has had VT with multiple morphologies.  Increased ventricular arrhythmias over 6 months.  ?Underlying cause.  No significant coronary disease.  Concerned that she has cardiac sarcoidosis (would also explain CHB). Amiodarone stopped in the past due to torsades and very long QTc on amiodarone.  Now on mexiletine.  Recurrent ventricular arrhythmias since decreasing prednisone makes me worry that she does indeed have cardiac sarcoidosis.   - Continue mexiletine.  - Titrate up Coreg if able.  - Keep K > 4.0 Mg > 2.0 - Continue prednisone 40 mg daily.  If arrhythmias not suppressed, can give burst of IV Solumedrol.  - If she needs an IV anti-arrhythmic, would use lidocaine.  - She is now s/p ICD upgrade.   3. H/o CHB: Cause is uncertain. Cath in 5/20 prior to PPM showed no significant disease. Possible cardiac sarcoidosis by cMRI but no evidence for pulmonary sarcoidosis. Needs cardiac PET scan for further evaluation for sarcoidosis.  4. Right Groin Hematoma => wound infection: Hematoma developed 7/26 at IABP site after  removal. She had a Femstop.  Hematoma resolved, but recently noted to have oozing at wound site.  MRSA grew from culture and linezolid was started.  CT abdomen/pelvis today showed no abscess.  - Send blood cultures.  - Continue linezolid.   Loralie Champagne 12/19/2019 5:12 PM

## 2019-12-19 NOTE — Consult Note (Signed)
[facility] for Infectious Disease    Date of Admission:  12/19/2019      Total days of antibiotics 1  Linezolid                Reason for Consult: Post Op MRSA wound     Referring Provider: mclean  Primary Care Provider: Milford Cage, PA   Assessment: Lisa Poole is a 54 y.o. female with MRSA infection involving recent cardiac catheterization / IABP site that seems to be responding nicely to linezolid course. There is no fluctuance or expressible drainage on exam today. Would consider ultrasound vs CT to ensure no deeper abscess to be certain given ICD in place - this will help understand if we need to incorporate surgical management and outline treatment duration.   Would continue linezolid course for 7 -10 days and have her follow up in ID clinic as planned barring imaging OK. She does not sound to have been systemically ill and likely doing blood cultures now would be low yield given antibiotics she has received.    I encouraged better attention to diabetes esp with addition of steroids for undetermined time frame. Discussed that this will help reduce infection potential in the future and heal from current procedure.   Will look out for CT results and follow up outpatient barring all is improving.    Plan: 1. Continue linezolid  2. Hold effexor until completed course  3. If cannot tolerate d/t H/A, can change #1 to Doxycycline BID and resume #2.  4. FU arranged in ID clinic.  5. Diabetes management with steroids on board  6. Wound care with clean dry dressing material to wick drainage. Soap and water. No neosporin.       Active Problems:   Ventricular tachycardia (HCC)   Wound infection after surgery   . aspirin EC  81 mg Oral Daily  . carvedilol  6.25 mg Oral BID WC  . cholecalciferol  1,000 Units Oral Daily  . dapagliflozin propanediol  10 mg Oral Daily  . [START ON 12/20/2019] digoxin  0.125 mg Oral Daily  . enoxaparin (LOVENOX) injection   40 mg Subcutaneous Q24H  . gabapentin  600 mg Oral BID  . insulin aspart  0-5 Units Subcutaneous QHS  . insulin aspart  0-9 Units Subcutaneous TID WC  . linezolid  600 mg Oral BID  . loratadine  10 mg Oral Daily  . metFORMIN  500 mg Oral BID  . mexiletine  150 mg Oral Q12H  . multivitamin with minerals  1 tablet Oral Daily  . pantoprazole  80 mg Oral Daily  . pravastatin  10 mg Oral q1800  . [START ON 12/20/2019] predniSONE  40 mg Oral Q breakfast  . predniSONE  40 mg Oral Once  . sacubitril-valsartan  1 tablet Oral BID  . sodium chloride flush  3 mL Intravenous Once  . sodium chloride flush  3 mL Intravenous Q12H  . spironolactone  25 mg Oral Daily    HPI: Lisa Poole is a 54 y.o. female admitted to the hospital for evaluation of dizziness and light headedness.   PMHX HTN, HLD, T2DM, CHB s/p PPM 02/2019 with recent device upgrade on 12/07/2019, AI, COVID 08-2019, NICM and VT.   Recently admitted with acute CHF with newly depressed LVEF --> required IABP for cardiac support briefly as well as inotropes.  Cardiac MRI concerning for cardiac sarcoidosis. She was treated with steroids inpatient empirically  and attempted weanign of this outpatient. She was seen in the HF clinic 8/06 with complaints of drainage to the R groin / IABP site. Was given Augmentin however superficial swab +MRSA. Changed to IV Linezolid after discussion between myself and Dr. Aundra Dubin and arrangements to follow up in ID clinic in 1 week to adjust length of therapy esp given prednisone.   In discussion with Lisa Poole and her husband, Lisa Poole the drainage has since decreased to very minimal clear/yellow tinged fluid that was once described to be thick and yellow. The Catheter puncture site was the site that was draining significantly at first, but second site from IABP now more open. They have been keeping clean and dry and using neosporin on the site. She denies any fevers, chills or sweating.  Tolerating Linezolid well -  holding effexor for now d/t DDI. Had some headaches recently that is new (h/o migraines remotely).    Review of Systems: Review of Systems  Constitutional: Negative for chills, fever and malaise/fatigue.  Respiratory: Negative for cough.   Cardiovascular: Negative for chest pain.  Gastrointestinal: Negative for abdominal pain, diarrhea and vomiting.  Genitourinary: Negative for dysuria.  Musculoskeletal: Negative for back pain and joint pain.  Skin: Negative for rash.       Open wound   Neurological: Positive for dizziness and headaches. Negative for focal weakness.    Past Medical History:  Diagnosis Date  . Aortic insufficiency   . Asthma 09/13/2006  . Complete heart block ([place]) 02/11/2019  . Cystocele without uterine prolapse 12/12/2018  . Diabetes mellitus without complication ([place])   . Elevated troponin level   . Hypercholesteremia   . Hypertension   . Primary osteoarthritis of left knee 12/10/2017    Social History   Tobacco Use  . Smoking status: Never Smoker  . Smokeless tobacco: Never Used  Vaping Use  . Vaping Use: Never used  Substance Use Topics  . Alcohol use: No  . Drug use: No    Family History  Problem Relation Age of Onset  . Colon cancer Maternal Grandmother   . Alzheimer's disease Mother    Allergies  Allergen Reactions  . Lisinopril Other (See Comments) and Cough    Flu like symptoms, also   . Ace Inhibitors Other (See Comments)    Flu-like symptoms  . Augmentin [Amoxicillin-Pot Clavulanate] Nausea And Vomiting  . Benazepril Other (See Comments)    Flu like symptoms  . Oxycodone-Acetaminophen Nausea And Vomiting  . Sulfa Antibiotics Nausea And Vomiting    OBJECTIVE: Blood pressure 102/72, pulse 99, temperature 98.2 F (36.8 C), temperature source Oral, resp. rate 13, height 5' (1.524 m), weight 77.1 kg, last menstrual period 07/25/2015, SpO2 100 %.  Physical Exam Constitutional:      Appearance: Normal appearance.  HENT:      Mouth/Throat:     Mouth: Mucous membranes are dry.  Eyes:     General: No scleral icterus.    Pupils: Pupils are equal, round, and reactive to light.  Cardiovascular:     Rate and Rhythm: Normal rate and regular rhythm.     Heart sounds: No murmur heard.   Pulmonary:     Effort: Pulmonary effort is normal.     Breath sounds: Normal breath sounds.  Abdominal:     General: Bowel sounds are normal. There is no distension.     Palpations: Abdomen is soft.  Skin:    General: Skin is warm and dry.     Capillary Refill: Capillary refill  takes less than 2 seconds.     Comments: PPM site to left chest is well healed. Some small ecchymosis remains.  R groin with small puncture site to fold - no drainage seen or expressed. No tenderness.  R groin with a second, larger puncture hole that appears eroded with some clear drainage. No tenderness.   Neurological:     Mental Status: She is alert and oriented to person, place, and time.     Lab Results Lab Results  Component Value Date   WBC 6.5 12/19/2019   HGB 13.7 12/19/2019   HCT 45.7 12/19/2019   MCV 90.9 12/19/2019   PLT 313 12/19/2019    Lab Results  Component Value Date   CREATININE 0.85 12/19/2019   BUN 22 (H) 12/19/2019   NA 141 12/19/2019   K 4.0 12/19/2019   CL 104 12/19/2019   CO2 25 12/19/2019    Lab Results  Component Value Date   ALT 50 (H) 11/28/2019   AST 103 (H) 11/28/2019   ALKPHOS 85 11/28/2019   BILITOT 3.4 (H) 11/28/2019     Microbiology: Recent Results (from the past 240 hour(s))  Aerobic Culture (superficial specimen)     Status: None   Collection Time: 12/15/19 10:30 AM   Specimen: Wound  Result Value Ref Range Status   Specimen Description WOUND RIGHT GROIN  Final   Special Requests NONE  Final   Gram Stain   Final    RARE WBC PRESENT, PREDOMINANTLY PMN FEW GRAM POSITIVE COCCI Performed at [facility], [address], [place], [place] [address]    Culture FEW METHICILLIN RESISTANT  STAPHYLOCOCCUS AUREUS  Final   Report Status 12/17/2019 FINAL  Final   Organism ID, Bacteria METHICILLIN RESISTANT STAPHYLOCOCCUS AUREUS  Final      Susceptibility   Methicillin resistant staphylococcus aureus - MIC*    CIPROFLOXACIN >=8 RESISTANT Resistant     ERYTHROMYCIN >=8 RESISTANT Resistant     GENTAMICIN >=16 RESISTANT Resistant     OXACILLIN >=4 RESISTANT Resistant     TETRACYCLINE <=1 SENSITIVE Sensitive     VANCOMYCIN <=0.5 SENSITIVE Sensitive     TRIMETH/SULFA <=10 SENSITIVE Sensitive     CLINDAMYCIN >=8 RESISTANT Resistant     RIFAMPIN <=0.5 SENSITIVE Sensitive     Inducible Clindamycin NEGATIVE Sensitive     * FEW METHICILLIN RESISTANT STAPHYLOCOCCUS AUREUS  SARS Coronavirus 2 by RT PCR (hospital order, performed in [facility] hospital lab) Nasopharyngeal Nasopharyngeal Swab     Status: None   Collection Time: 12/19/19 12:36 PM   Specimen: Nasopharyngeal Swab  Result Value Ref Range Status   SARS Coronavirus 2 NEGATIVE NEGATIVE Final    Comment: (NOTE) SARS-CoV-2 target nucleic acids are NOT DETECTED.  The SARS-CoV-2 RNA is generally detectable in upper and lower respiratory specimens during the acute phase of infection. The lowest concentration of SARS-CoV-2 viral copies this assay can detect is 250 copies / mL. A negative result does not preclude SARS-CoV-2 infection and should not be used as the sole basis for treatment or other patient management decisions.  A negative result may occur with improper specimen collection / handling, submission of specimen other than nasopharyngeal swab, presence of viral mutation(s) within the areas targeted by this assay, and inadequate number of viral copies (<250 copies / mL). A negative result must be combined with clinical observations, patient history, and epidemiological information.  Fact Sheet for Patients:   StrictlyIdeas.no  Fact Sheet for Healthcare  Providers: BankingDealers.co.za  This  test is not yet approved or  cleared by the [place] and has been authorized for detection and/or diagnosis of SARS-CoV-2 by FDA under an Emergency Use Authorization (EUA).  This EUA will remain in effect (meaning this test can be used) for the duration of the COVID-19 declaration under Section 564(b)(1) of the Act, 21 U.S.C. section 360bbb-3(b)(1), unless the authorization is terminated or revoked sooner.  Performed at [facility], [place] [address], [place], [place] [address]     Janene Madeira, MSN, NP-C [facility]ious Disease [email or web address] Pager: [phone] Office: Export: [phone]  12/19/2019 3:15 PM

## 2019-12-19 NOTE — ED Provider Notes (Signed)
MSE was initiated and I personally evaluated the patient and placed orders (if any) at  11:33 AM on August 33, [id number]   54 year old female presents with lightheaded/dizziness. Pt was recently admitted for CHF exacerbation and cardiogenic shock. She underwent defibrillator placement since her EF dropped to 20-25%. She is being worked up for cardiac sarcoidosis. She went to the cardiology clinic today and device was interrogated and showed high burden of Vtach so she was sent to the ED. Cardiology has already seen the patient and plan on admission. Pt denies any complaints currently  On exam the patient is calm and comfortable appearing, NAD   The patient appears stable so that the remainder of the MSE may be completed by another provider.   Recardo Evangelist, PA-C 12/19/19 1227    Carmin Muskrat, MD 12/19/19 1308

## 2019-12-19 NOTE — H&P (Addendum)
Cardiology Admission History and Physical:   Patient ID: Lisa Poole MRN: [id number]; DOB: [birth year]   Admission date: 12/19/2019  Primary Care Provider: Milford Cage, PA [facility] Cardiologist: Dr. [facility] Electrophysiologist:  Dr. Lovena Le  Chief Complaint:  VT storm by device check  Patient Profile:   Lisa Poole is a 54 y.o. female with h/o HTN, HLD, T2DM, CHB s/p PPM implant 02/2019 followed by Dr. Lovena Le -> Upgrade to BIV device 12/07/19, PAF (not on a/c), aortic insufficiency,  recent COVID infection in April 811, systolic heart failure/ nonischemic CM, and VT on Mexitil (QT prolongation on amiodarone)  History of Present Illness:   Lisa Poole was seen in device clinic today for wound check s/p BIV upgrade. She complained of lightheadedness and dizziness over the past few days. Device interrogation significant for "Histogram distribution suggests ~20% of ventricular HRs in 130-160bpm range. BiVP 74.0%, likely due to VT/PVCs. No mode switches. 6 FVT episodes and 2 VT episodes since 12/16/19, all treated with 1-2 bursts of ATP, some episodes ongoing below detection after ATP delivered. 214 monitored VT episodes, 90 NSVT episodes. Longest V sensing episode 2hr 2mn, markers suggest VT. Most recent treated episode this AM". Discussed with Dr. ARayann Hemanand Dr. TLovena Lewho recommended patient proceed to ER for further evaluation and treatment.   Pt recently admitted 7/18 - 12/08/2019 with acute CHF with new drop in LVEF. She developed worsening HF/shock w coox in the 40s requiring inotropic support and placement of IABP. IV amiodarone used for VT suppression. Changed to lidocaine with QT prolongation -> eventually to po mexiletine. LHC with no significant CAD. cMRI with possible sarcoidosis vs prior myocarditis. She was placed on empiric prednisone for possible sarcoid.    Pt ultimately weaned off inotrope support and IABP. Rhythm stabilized and underwent CRT-D upgrade on  7/29.   She was discharge in stable condition 7/30.    Seen in HF clinic 8/6 and doing over all well apart from drainage from her groin/IABP site. She had been placed on Augmentin by her PCP. Wound culture on groin wound 12/15/2019 with MRSA +. ABX change to Linezolid. (Effexor recommended to be hold while on this). Steroids also titrated down, thought possibly contributing to poor healing.   She reports minimal drainage from her R groin site. Husband reports she has had intermittent paleness during her episodes of fast HR, including in church on Sunday. No syncope.   Past Medical History:  Diagnosis Date  . Aortic insufficiency   . Asthma 09/13/2006  . Complete heart block ([place] 02/11/2019  . Cystocele without uterine prolapse 12/12/2018  . Diabetes mellitus without complication ([place]   . Elevated troponin level   . Hypercholesteremia   . Hypertension   . Primary osteoarthritis of left knee 12/10/2017    Past Surgical History:  Procedure Laterality Date  . APPENDECTOMY    . BIV ICD INSERTION CRT-D N/A 12/07/2019   Procedure: BIV ICD INSERTION CRT-D;  Surgeon: TEvans Lance MD;  [facility];  Service: Cardiovascular;  Laterality: N/A;  . IABP INSERTION N/A 12/01/2019   Procedure: IABP Insertion;  Surgeon: MLarey Dresser MD;  [facility];  Service: Cardiovascular;  Laterality: N/A;  . LEFT HEART CATH AND CORONARY ANGIOGRAPHY N/A 09/28/2018   Procedure: LEFT HEART CATH AND CORONARY ANGIOGRAPHY;  Surgeon: KTroy Sine MD;  [facility];  Service: Cardiovascular;  Laterality: N/A;  . PACEMAKER IMPLANT N/A 02/13/2019  Procedure: PACEMAKER IMPLANT;  Surgeon: Evans Lance, MD;  Location: [place] [facility];  Service: Cardiovascular;  Laterality: N/A;  . PACEMAKER IMPLANT    . RIGHT/LEFT HEART CATH AND CORONARY ANGIOGRAPHY N/A 12/01/2019   Procedure: RIGHT/LEFT HEART CATH AND CORONARY ANGIOGRAPHY;  Surgeon: Larey Dresser, MD;  Location: [place] [facility];  Service: Cardiovascular;  Laterality: N/A;  . ROTATOR CUFF REPAIR Bilateral 2017  . SINUS EXPLORATION    . TOTAL KNEE ARTHROPLASTY Left 12/10/2017   Procedure: LEFT TOTAL KNEE ARTHROPLASTY;  Surgeon: Sydnee Cabal, MD;  Location: [facility];  Service: Orthopedics;  Laterality: Left;  Adductor Block  . TUBAL LIGATION       Medications Prior to Admission: Prior to Admission medications   Medication Sig Start Date End Date Taking? Authorizing Provider  ACCU-CHEK AVIVA PLUS test strip 3 (three) times daily. 11/24/19   [provider]  albuterol (PROVENTIL HFA;VENTOLIN HFA) 108 (90 Base) MCG/ACT inhaler Inhale 1-2 puffs into the lungs every 6 (six) hours as needed for wheezing or shortness of breath. Patient not taking: Reported on 12/19/2019 07/30/18   Drenda Freeze, MD  aspirin EC 81 MG tablet Take 81 mg by mouth daily. Swallow whole.    [provider]  Biotin 10 MG CAPS Take 10 mg by mouth daily.    [provider]  carvedilol (COREG) 6.25 MG tablet Take 1 tablet (6.25 mg total) by mouth 2 (two) times daily with a meal. 12/15/19   Larey Dresser, MD  cetirizine (ZYRTEC) 10 MG tablet Take 10 mg by mouth daily.    [provider]  Cholecalciferol (VITAMIN D-3) 25 MCG (1000 UT) CAPS Take 1,000 Units by mouth daily.     [provider]  dapagliflozin propanediol (FARXIGA) 10 MG TABS tablet Take 1 tablet (10 mg total) by mouth daily. 12/08/19   Larey Dresser, MD  digoxin (LANOXIN) 0.125 MG tablet Take 1 tablet (0.125 mg total) by mouth daily. 12/09/19   Larey Dresser, MD  gabapentin (NEURONTIN) 600 MG tablet Take 600 mg by mouth 2 (two) times daily.    [provider]  HYDROcodone-acetaminophen (NORCO) 10-325 MG tablet Take 1 tablet by mouth 4 (four) times daily.     [provider]  linezolid (ZYVOX) 600 MG tablet Take 1 tablet (600 mg total) by mouth 2 (two) times daily for 10 days. 12/17/19 12/27/19  Darreld Mclean, PA-C  meloxicam (MOBIC) 15 MG tablet Take 15 mg by mouth daily. 02/05/19   [provider]  metFORMIN (GLUCOPHAGE) 500 MG tablet Take 500 mg by mouth 2 (two) times daily. 07/26/19   [provider]  mexiletine (MEXITIL) 150 MG capsule Take 1 capsule (150 mg total) by mouth every 12 (twelve) hours. 12/08/19   Larey Dresser, MD  Multiple Vitamins-Minerals (CENTRUM SILVER 50+WOMEN) TABS Take 1 tablet by mouth daily.    [provider]  naloxone [facility]) 4 MG/0.1ML LIQD nasal spray kit Place 1 spray into the nose as directed.    [provider]  omeprazole (PRILOSEC) 40 MG capsule Take 40 mg by mouth daily.    [provider]  ondansetron (ZOFRAN ODT) 4 MG disintegrating tablet Take 1 tablet (4 mg total) by mouth every 8 (eight) hours as needed for nausea or vomiting. 10/20/19   Lucrezia Starch, MD  pravastatin (PRAVACHOL) 10 MG tablet Take 1 tablet (10 mg total) by mouth daily at 6 PM. 12/08/19   Consuelo Pandy,  PA-C  predniSONE (DELTASONE) 20 MG tablet Take 0.5 tablets (10 mg total) by mouth daily with breakfast. 12/18/19   Lyda Jester M, PA-C  sacubitril-valsartan (ENTRESTO) 49-51 MG Take 1 tablet by mouth 2 (two) times daily. 12/08/19   Larey Dresser, MD  spironolactone (ALDACTONE) 25 MG tablet Take 1 tablet (25 mg total) by mouth daily. 12/09/19   Larey Dresser, MD  venlafaxine XR (EFFEXOR XR) 150 MG 24 hr capsule Take 1 capsule (150 mg total) by mouth daily with breakfast. 12/18/19   Larey Dresser, MD     Allergies:    Allergies  Allergen Reactions  . Lisinopril Other (See Comments) and Cough    Flu like symptoms, also   . Ace Inhibitors Other (See Comments)    Flu-like symptoms  . Augmentin [Amoxicillin-Pot Clavulanate] Nausea And Vomiting  . Benazepril Other (See Comments)    Flu like symptoms  . Oxycodone-Acetaminophen Nausea And Vomiting  . Sulfa Antibiotics Nausea And Vomiting    Social History:   Social History    Socioeconomic History  . Marital status: Married    Spouse name: Not on file  . Number of children: Not on file  . Years of education: Not on file  . Highest education level: Not on file  Occupational History  . Not on file  Tobacco Use  . Smoking status: Never Smoker  . Smokeless tobacco: Never Used  Vaping Use  . Vaping Use: Never used  Substance and Sexual Activity  . Alcohol use: No  . Drug use: No  . Sexual activity: Yes    Birth control/protection: Post-menopausal  Other Topics Concern  . Not on file  Social History Narrative  . Not on file   Social Determinants of Health   Financial Resource Strain:   . Difficulty of Paying Living Expenses:   Food Insecurity:   . Worried About Charity fundraiser in the Last Year:   . Arboriculturist in the Last Year:   Transportation Needs:   . Film/video editor (Medical):   Marland Kitchen Lack of Transportation (Non-Medical):   Physical Activity:   . Days of Exercise per Week:   . Minutes of Exercise per Session:   Stress:   . Feeling of Stress :   Social Connections:   . Frequency of Communication with Friends and Family:   . Frequency of Social Gatherings with Friends and Family:   . Attends Religious Services:   . Active Member of Clubs or Organizations:   . Attends Archivist Meetings:   Marland Kitchen Marital Status:   Intimate Partner Violence:   . Fear of Current or Ex-Partner:   . Emotionally Abused:   Marland Kitchen Physically Abused:   . Sexually Abused:     Family History:   The patient's family history includes Alzheimer's disease in her mother; Colon cancer in her maternal grandmother.    ROS:  Please see the history of present illness.  All other ROS reviewed and negative.     Physical Exam/Data:   Vitals:   12/19/19 1035  BP: (!) 172/77  Pulse: 89  Resp: 16  Temp: 98.2 F (36.8 C)  TempSrc: Oral  SpO2: 100%  Weight: 77.1 kg  Height: 5' (1.524 m)   No intake or output data in the 24 hours ending 12/19/19  1043 Last 3 Weights 12/19/2019 12/15/2019 12/08/2019  Weight (lbs) 170 lb 173 lb 173 lb 4.8 oz  Weight (kg) 77.111 kg 78.472 kg 78.608 kg  Body mass index is 33.2 kg/m.  General:  Well nourished, well developed, in no acute distress at this time. HEENT: normal Lymph: no adenopathy Neck: no JVD Endocrine:  No thryomegaly Vascular: No carotid bruits; FA pulses 2+ bilaterally without bruits  Cardiac:  normal S1, S2; RRR; no murmur appreciated Lungs:  clear to auscultation bilaterally, no wheezing, rhonchi or rales  Abd: soft, nontender, no hepatomegaly  Ext: Trace ankle edema Musculoskeletal:  No deformities, BUE and BLE strength normal and equal Skin: warm and dry  Neuro:  CNs 2-12 intact, no focal abnormalities noted Psych:  Normal affect    EKG:  The ECG is pending. [place] 12/15/2019 was personally reviewed and demonstrated NSR/AS-VP at 90 bpm, with QTc ~ 475  Relevant CV Studies:  Echocardiogram 11/27/2019 1. Left ventricular ejection fraction, by estimation, is 20 to 25%. The  left ventricle has severely decreased function. The left ventricle  demonstrates regional wall motion abnormalities (see scoring  diagram/findings for description). The left  ventricular internal cavity size was mildly dilated. Left ventricular  diastolic parameters are indeterminate.  2. Right ventricular systolic function is normal. The right ventricular  size is mildly enlarged. There is moderately elevated pulmonary artery  systolic pressure. The estimated right ventricular systolic pressure is  78.2 mmHg.  3. Left atrial size was mildly dilated.  4. Right atrial size was moderately dilated.  5. The mitral valve is normal in structure. Moderate mitral valve  regurgitation.  6. Tricuspid valve regurgitation is moderate to severe.  7. The aortic valve is grossly normal. Aortic valve regurgitation is  mild.  8. The inferior vena cava is normal in size with <50% respiratory  variability,  suggesting right atrial pressure of 8 mmHg.   [facility] 12/01/2019 1. Elevated right and left heart filling pressures.  2. Low cardiac output on norepinephrine 3.  3. Mixed pulmonary venous/pulmonary arterial hypertension.  4. No significant coronary disease.  5. Successful IABP placement.   cMRI 12/05/2019 1. Normal LV size with multiple wall motion abnormalities. The septum was relatively thin with severe hypokinesis and septal-lateral dyssynchrony, the anterior and inferior walls were hypokinetic. LV EF 30%.  2. Normal RV size with moderate to severely decreased systolic function, EF 95%. Pacemaker in RV.  3. Non-coronary LGE pattern. Patchy LGE in the basal septum, basal-mid inferoseptum/inferior wall, basal anterior wall, mid anterolateral wall. This could be consistent with cardiac sarcoidosis. Alternatively, could see similar pattern with prior myocarditis.  Laboratory Data:  High Sensitivity Troponin:   Recent Labs  Lab 11/26/19 2012 11/26/19 2231 11/27/19 0402  TROPONINIHS 32* 52* 67*      Chemistry Recent Labs  Lab 12/15/19 1009  NA 140  K 4.3  CL 104  CO2 24  GLUCOSE 141*  BUN 16  CREATININE 0.82  CALCIUM 9.5  GFRNONAA >60  GFRAA >60  ANIONGAP 12    No results for input(s): PROT, ALBUMIN, AST, ALT, ALKPHOS, BILITOT in the last 168 hours. Hematology Recent Labs  Lab 12/15/19 1009  WBC 12.5*  RBC 5.11  HGB 13.6  HCT 46.3*  MCV 90.6  MCH 26.6  MCHC 29.4*  RDW 16.6*  PLT 449*   BNPNo results for input(s): BNP, PROBNP in the last 168 hours.  DDimer No results for input(s): DDIMER in the last 168 hours.   Radiology/Studies:  CUP PACEART INCLINIC DEVICE CHECK  Result Date: 12/19/2019 Wound check appointment. Steri-strips removed. Wound without redness or edema. Incision edges approximated, stitch removed from medial incision. Wound otherwise well  healed. Patient educated about signs/symptoms of infection, aware to call if any noted. Normal  device function. Thresholds, sensing, and impedances consistent with implant measurements. RA and LV (His) outputs at chronic settings S/P BiV upgrade. RV output at 3.5V with autocapture on for extra safety margin until 3 month visit. Histogram distribution suggests ~20% of ventricular HRs in 130-160bpm range. BiVP 74.0%, likely due to VT/PVCs. No mode switches. 6 FVT episodes and 2 VT episodes since 12/16/19, all treated with 1-2 bursts of ATP, some episodes ongoing below detection after ATP delivered. 214 monitored VT episodes, 90 NSVT episodes. Longest V sensing episode 2hr 68mn, markers suggest VT. Most recent treated episode this AM, time stamps inaccurate due to incorrect programmed device time (updated today). Patient reports frequent  episodes of dizziness and palpitations in recent days. On carvedilol, digoxin, and mexiletine. Discussed with Dr. Allred/Dr. TLovena Le [facility]. Patient declines transport by EMS, escorted to private vehicle. Patient's husband aware of plan [facility] Device alerts adjusted per protocol. Patient educated about wound care, arm mobility, lifting restrictions, shock plan, and Carelink monitor. Carelink on 03/08/20, ROV with RU on 12/28/19.ELevander CampionBSN, RN, CCDS   Assessment and Plan:   1. VT storm, multiple foci Her VT noted to increase significantly with down titration of systemic steroids, which may be significant in the setting of suspected sarcoidosis  Labs/Electrolytes pending. With multiple foci (at least 3 noted on device interrogation) and infection, poor candidate for ablation.  Continue mexiletine 150 mg BID for now while discussing options with ID re: infection and steroids.  Continue coreg 6.25 mg BID as tolerated.  Will increase steroids to 20 mg daily.   2. Chronic systolic CHF CHF team aware of patient admission CXR unremarkable Will continue current medications for the time begin.  Labs pending.   3. Rt groin wound Improving per patient with only minimal drainage and no fevers or chills.  Pt on linezolid. Confirmed with Pharm-D no concern for QT prolongation with this drug.  With recent device implantation, Discussed with ID who recommend continuing Linezolid at this time (but will discuss further) and drawing Blood cultures despite having already been on antibiotics.   4. HTN Will resume home meds and titrate as needed/tolerated in the setting of her CHF.  5. Mood Pt on Effexor -> holding for risk of serotonin syndrome on Linezolid.   Pt may have refractory headaches, and if so, per ID can consider transition to Doxy to finish ABX course.   6. DM2 Sugars have been higher on steroids.  Continue metformin for now.  Add sliding scale.  If remains difficult will have DM coordinator see.   Severity of Illness: The appropriate patient status for this patient is INPATIENT. Inpatient status is judged to be reasonable and necessary in order to provide the required intensity of service to ensure the patient's safety. The patient's presenting symptoms, physical exam findings, and initial radiographic and laboratory data in the context of their chronic comorbidities is felt to place them at high risk for further clinical deterioration. Furthermore, it is not anticipated that the patient will be medically stable for discharge from the hospital within 2 midnights of admission. The following factors support the patient status of inpatient.   " The patient's presenting symptoms include dizziness, lightheadedness. " The worrisome physical exam findings include frequent VT requiring ATP therapy on device interrogation. " The initial radiographic was unremrkable " The initial  labwork was pending at time of admission, and will be addressed as available.  " The chronic co-morbidities include systolic CHF, h/o VT, CHB s/p PPM -> CRT-D, and Rt groin wound.  * I certify that at the point  of admission it is my clinical judgment that the patient will require inpatient hospital care spanning beyond 2 midnights from the point of admission due to high intensity of service, high risk for further deterioration and high frequency of surveillance required.*   For questions or updates, please contact [place] Please consult [email or web address] for contact info under     Signed, Shirley Friar, PA-C  12/19/2019 10:43 AM

## 2019-12-19 NOTE — ED Notes (Signed)
Lunch tray ordered 

## 2019-12-19 NOTE — ED Triage Notes (Signed)
To ED for eval after being seen at cardiology and having defib/pacemaker evaluated. Pt complains of dizziness and weakness over the past few days, intermittently. No pain. Defib/pacemaker placed 7/29. Alert and oriented. Appears in nad.

## 2019-12-19 NOTE — Consult Note (Addendum)
WOC Nurse Consult Note: Consult requested to provide topical treatment recommendations for right groin wound.  Performed remotely after review of the EMR.  Dr Chalmers Cater notes; "5 mm x 5 mm open wound in the right inguinal fold consistent with prior intra-aortic balloon pump placement.  Small amount of purulent drainage at the skin edge." Dressing procedure/placement/frequency: Topical treatment orders provided for bedside nurses to perform to absorb drainage and provide antimicrobial benefits as follows: Cut small piece of Aquacel Kellie Simmering # [id number]) and tuck over right groin dressing, then cover with foam dressing.  (Change foam dressing Q 3 days or PRN soiling.) Please re-consult if further assistance is needed.  Thank-you,  Julien Girt MSN, [place], [place], [address], [place]

## 2019-12-20 ENCOUNTER — Inpatient Hospital Stay (HOSPITAL_COMMUNITY): Payer: Medicare Other

## 2019-12-20 ENCOUNTER — Encounter (HOSPITAL_COMMUNITY): Payer: Self-pay | Admitting: Cardiology

## 2019-12-20 DIAGNOSIS — I5022 Chronic systolic (congestive) heart failure: Secondary | ICD-10-CM | POA: Diagnosis not present

## 2019-12-20 DIAGNOSIS — T8149XA Infection following a procedure, other surgical site, initial encounter: Secondary | ICD-10-CM | POA: Diagnosis not present

## 2019-12-20 DIAGNOSIS — I472 Ventricular tachycardia: Principal | ICD-10-CM

## 2019-12-20 LAB — CBC WITH DIFFERENTIAL/PLATELET
Abs Immature Granulocytes: 0.03 10*3/uL (ref 0.00–0.07)
Basophils Absolute: 0 10*3/uL (ref 0.0–0.1)
Basophils Relative: 0 %
Eosinophils Absolute: 0 10*3/uL (ref 0.0–0.5)
Eosinophils Relative: 0 %
HCT: 44.1 % (ref 36.0–46.0)
Hemoglobin: 13.3 g/dL (ref 12.0–15.0)
Immature Granulocytes: 1 %
Lymphocytes Relative: 15 %
Lymphs Abs: 0.8 10*3/uL (ref 0.7–4.0)
MCH: 26.5 pg (ref 26.0–34.0)
MCHC: 30.2 g/dL (ref 30.0–36.0)
MCV: 87.8 fL (ref 80.0–100.0)
Monocytes Absolute: 0.3 10*3/uL (ref 0.1–1.0)
Monocytes Relative: 5 %
Neutro Abs: 4.1 10*3/uL (ref 1.7–7.7)
Neutrophils Relative %: 79 %
Platelets: 315 10*3/uL (ref 150–400)
RBC: 5.02 MIL/uL (ref 3.87–5.11)
RDW: 16.7 % — ABNORMAL HIGH (ref 11.5–15.5)
WBC: 5.2 10*3/uL (ref 4.0–10.5)
nRBC: 0 % (ref 0.0–0.2)

## 2019-12-20 LAB — BASIC METABOLIC PANEL
Anion gap: 11 (ref 5–15)
BUN: 20 mg/dL (ref 6–20)
CO2: 25 mmol/L (ref 22–32)
Calcium: 9.2 mg/dL (ref 8.9–10.3)
Chloride: 103 mmol/L (ref 98–111)
Creatinine, Ser: 0.89 mg/dL (ref 0.44–1.00)
GFR calc Af Amer: >60 mL/min (ref >60)
GFR calc non Af Amer: >60 mL/min (ref >60)
Glucose, Bld: 243 mg/dL — ABNORMAL HIGH (ref 70–99)
Potassium: 4.5 mmol/L (ref 3.5–5.1)
Sodium: 139 mmol/L (ref 135–145)

## 2019-12-20 LAB — GLUCOSE, CAPILLARY
Glucose-Capillary: 253 mg/dL — ABNORMAL HIGH (ref 70–99)
Glucose-Capillary: 136 mg/dL — ABNORMAL HIGH (ref 70–99)
Glucose-Capillary: 198 mg/dL — ABNORMAL HIGH (ref 70–99)

## 2019-12-20 LAB — MAGNESIUM: Magnesium: 2.3 mg/dL (ref 1.7–2.4)

## 2019-12-20 MED ORDER — CHLORHEXIDINE GLUCONATE CLOTH 2 % EX PADS
6.0000 | MEDICATED_PAD | Freq: Every day | CUTANEOUS | Status: DC
  Administered 2019-12-21: 6 via TOPICAL

## 2019-12-20 MED ORDER — HEPARIN (PORCINE) 25000 UT/250ML-% IV SOLN
900.0000 [IU]/h | INTRAVENOUS | Status: DC
  Administered 2019-12-20: 1050 [IU]/h via INTRAVENOUS
  Filled 2019-12-20: qty 500

## 2019-12-20 MED ORDER — FENTANYL CITRATE (PF) 100 MCG/2ML IJ SOLN
INTRAMUSCULAR | Status: AC
  Administered 2019-12-20: 50 ug via INTRAVENOUS
  Filled 2019-12-20: qty 2

## 2019-12-20 MED ORDER — FOLIC ACID 1 MG PO TABS
1.0000 mg | ORAL_TABLET | Freq: Every day | ORAL | Status: DC
  Administered 2019-12-20 – 2019-12-27 (×8): 1 mg via ORAL
  Filled 2019-12-20 (×8): qty 1

## 2019-12-20 MED ORDER — FENTANYL CITRATE (PF) 100 MCG/2ML IJ SOLN: 50.0000 ug | INTRAMUSCULAR | Status: DC | PRN

## 2019-12-20 MED ORDER — HEPARIN BOLUS VIA INFUSION
4000.0000 [IU] | Freq: Once | INTRAVENOUS | Status: AC
  Administered 2019-12-20: 4000 [IU] via INTRAVENOUS
  Filled 2019-12-20: qty 4000

## 2019-12-20 MED ORDER — METHOTREXATE 2.5 MG PO TABS
10.0000 mg | ORAL_TABLET | ORAL | Status: DC
  Administered 2019-12-20: 10 mg via ORAL
  Filled 2019-12-20: qty 4

## 2019-12-20 MED ORDER — LIDOCAINE BOLUS VIA INFUSION
50.0000 mg | Freq: Once | INTRAVENOUS | Status: DC
  Filled 2019-12-20: qty 52

## 2019-12-20 MED ORDER — SODIUM CHLORIDE 0.9 % IV SOLN
1000.0000 mg | Freq: Every day | INTRAVENOUS | Status: DC
  Administered 2019-12-20 – 2019-12-24 (×5): 1000 mg via INTRAVENOUS
  Filled 2019-12-20 (×6): qty 8

## 2019-12-20 MED ORDER — LIDOCAINE IN D5W 4-5 MG/ML-% IV SOLN
1.0000 mg/min | INTRAVENOUS | Status: DC
  Administered 2019-12-20 – 2019-12-22 (×2): 1 mg/min via INTRAVENOUS
  Filled 2019-12-20 (×3): qty 500

## 2019-12-20 MED ORDER — DIAZEPAM 2 MG PO TABS
2.0000 mg | ORAL_TABLET | Freq: Once | ORAL | Status: AC
  Administered 2019-12-20: 2 mg via ORAL
  Filled 2019-12-20: qty 1

## 2019-12-20 NOTE — Progress Notes (Signed)
Patient HR up in 130-140s with persistent VT pt asymptomatic BP is soft see epic, MD notified NP okay to hold entresto and Aldactone.Dr. Caryl Comes and NP at bedside. Report given to [facility] RN, patient transfer to per order.

## 2019-12-20 NOTE — Progress Notes (Signed)
Results of vascular US noted with RIJ DVT, likely from IJ access last admission.  She will require anticoagulation.   Unfortunately, with recent ICD upgrade, left sided IJ is not ideal. If develops thrombosis in this side could attach to and dislodge ICD leads which she is actively using for therapy for her VT.   Will therefore plan to continue LIJ CVL overnight, but place R sided PICC first thing in am then discontinu e LIJ access in order to protect her ICD leads.    Will start on anticoagulation as soon as possible after PICC line placed.  All above discussed with Dr. Quentin Ore.  Legrand Como [address] [place], [place]

## 2019-12-20 NOTE — Progress Notes (Addendum)
VT paced terminate by Dr. Caryl Comes.   As she has had recurrent, sustained VT below detection, adjustments were made to therapy zone.   Pt will receive ATP but no shocks for VT 130 - 170. Pt will receive Burst -> Ramp -> Shock for VT > 171.   Additional therapies reviewed through UpToDate with team in regards to recommendations for Methotrexate and Folate for patients with Sarcoidosis.   Limited data and clinical experience per the literature (1), support selective use of  Methotrexate as a second line agent/glucocorticoid-sparing agent to reduce the risk of glucocorticoid related toxicities.   Per UptoDate: "MTX can be administered either orally or intramuscularly. We typically begin with oral therapy at a dose of 5 to 7.5 mg weekly. The dose is gradually increased (eg, by increments of 2.5 mg every two weeks) until a dose of 10 to 15 mg per week is achieved. We switch to intramuscular MTX when patients have refractory nausea or have not achieved a beneficial effect after three to six months of oral therapy at 15 mg per week"  In addition (2), Folic acid is usually given at a dose of 1 mg per day or 5 mg weekly is routinely given to patients on chronic MTX therapy to reduce the incidence of myelosuppression.  Discussed with Dr. Quentin Ore, and MTX + Folate likely more effective in helping to wean steroids, and would not necessarily be helpful in an acute flare.  Will discuss further with team for plan.  Plan to move to CCU for lidocaine use.   Legrand Como [address]" [place], PA-C  12/20/2019 12:33 PM     Pt seen and pacetermination   Have discussed with DR DM and will begin MTX and Folic acid based on previous discussions with MAYO and paper ( as noted) frm JA[facility]-  Have reviewed with patient.  I am not convinced that lido will be helpful here and would have low threshold for its discontinuation.  If she has active sarcoid, then inflammatory triggering is supposed to be mechanism but cellular  electrophysiology is scant informing antiarrhtyhmic benefit.  There is at least one paper, however, describing its successful use.  Critical care time included management of VT and the construction of plan for long term care with consultants   1hr 5 min

## 2019-12-20 NOTE — Progress Notes (Signed)
   Discussed with Pharmacy, ID, and attending.   For lidocaine would prefer central access per Pharmacy due to Ph of mix.   With recent device and groin wound, R-IJ central line will carry less infectious risk than PICC per ID.   Have reached out CCM to request placement.  Legrand Como [address] [place], PA-C  12/20/2019 2:23 PM

## 2019-12-20 NOTE — Progress Notes (Signed)
  Paged for recurrent, now persistent VT.   On my exam has now been in slow VT upper 130s/140s for approximately 20 minutes.  Systolic BP in 04V, and has not yet had her Entresto.   Discussed case with Dr. Aundra Dubin and Dr. Quentin Ore.  Will stop mexitil and transition to IV lidocaine with 50 mg bolus.  Will require transfer to ICU.   Pt is relatively asymptomatic. Was bathing at time she went back into AF (was already in 140s when reconnected to tele). She denies lightheadedness or dizziness.   Will also use programmer to attempt to pace patient out of her VT.   Legrand Como [address] [place], PA-C  12/20/2019 11:19 AM

## 2019-12-20 NOTE — Progress Notes (Signed)
Patient ID: Lisa Poole, female   DOB: 01-02-1966, 54 y.o.   MRN: [id number]     Advanced Heart Failure Rounding Note  PCP-Cardiologist: No primary care provider on file.   Subjective:    No dyspnea.  She had short runs of self-limited VT overnight.  This morning, she has been in VT at a rate of about 140 for about 20 minutes => pace-terminated by Dr. Caryl Comes.  She feels palpitations, no lightheadedness.    Objective:   Weight Range: 77 kg Body mass index is 33.14 kg/m.   Vital Signs:   Temp:  [97.7 F (36.5 C)-98.3 F (36.8 C)] 97.9 F (36.6 C) (08/11 1112) Pulse Rate:  [78-139] 95 (08/11 1148) Resp:  [13-18] 16 (08/11 1148) BP: (87-115)/(54-80) 113/80 (08/11 1148) SpO2:  [96 %-100 %] 99 % (08/11 1148) Weight:  [77 kg] 77 kg (08/11 0454) Last BM Date: 12/19/19  Weight change: Filed Weights   12/19/19 1035 12/20/19 0454  Weight: 77.1 kg 77 kg    Intake/Output:   Intake/Output Summary (Last 24 hours) at 12/20/2019 1156 Last data filed at 12/20/2019 1014 Gross per 24 hour  Intake 600 ml  Output 1350 ml  Net -750 ml      Physical Exam    General:  Well appearing. No resp difficulty HEENT: Normal Neck: Supple. JVP not elevated. Carotids 2+ bilat; no bruits. No lymphadenopathy or thyromegaly appreciated. Cor: PMI nondisplaced. Tachy, regular rate & rhythm. No rubs, gallops or murmurs. Lungs: Clear Abdomen: Soft, nontender, nondistended. No hepatosplenomegaly. No bruits or masses. Good bowel sounds. Extremities: No cyanosis, clubbing, rash, edema Neuro: Alert & orientedx3, cranial nerves grossly intact. moves all 4 extremities w/o difficulty. Affect pleasant   Telemetry   NSR with BiV pacing => VT 140   Labs    CBC Recent Labs    12/19/19 1135 12/20/19 0634  WBC 6.5 5.2  NEUTROABS  --  4.1  HGB 13.7 13.3  HCT 45.7 44.1  MCV 90.9 87.8  PLT 313 245   Basic Metabolic Panel Recent Labs    12/19/19 1135 12/20/19 0634  NA 141 139  K 4.0 4.5  CL 104  103  CO2 25 25  GLUCOSE 112* 243*  BUN 22* 20  CREATININE 0.85 0.89  CALCIUM 9.1 9.2  MG 1.8 2.3   Liver Function Tests No results for input(s): AST, ALT, ALKPHOS, BILITOT, PROT, ALBUMIN in the last 72 hours. No results for input(s): LIPASE, AMYLASE in the last 72 hours. Cardiac Enzymes No results for input(s): CKTOTAL, CKMB, CKMBINDEX, TROPONINI in the last 72 hours.  BNP: BNP (last 3 results) Recent Labs    11/26/19 2011  BNP 1,258.1*    ProBNP (last 3 results) No results for input(s): PROBNP in the last 8760 hours.   D-Dimer No results for input(s): DDIMER in the last 72 hours. Hemoglobin A1C No results for input(s): HGBA1C in the last 72 hours. Fasting Lipid Panel No results for input(s): CHOL, HDL, LDLCALC, TRIG, CHOLHDL, LDLDIRECT in the last 72 hours. Thyroid Function Tests No results for input(s): TSH, T4TOTAL, T3FREE, THYROIDAB in the last 72 hours.  Invalid input(s): FREET3  Other results:   Imaging    CT ABDOMEN PELVIS WO CONTRAST  Result Date: 12/19/2019 CLINICAL DATA:  Right groin pain, erythema, and drainage. Recent removal of intra-aortic balloon pump. Suspect soft tissue infection. EXAM: CT ABDOMEN AND PELVIS WITHOUT CONTRAST TECHNIQUE: Multidetector CT imaging of the abdomen and pelvis was performed following the standard protocol without IV  contrast. COMPARISON:  12/04/2019 FINDINGS: Lower chest: No acute findings. Hepatobiliary: No mass visualized on this unenhanced exam. Gallbladder is unremarkable. No evidence of biliary ductal dilatation. Pancreas: No mass or inflammatory process visualized on this unenhanced exam. Spleen:  Within normal limits in size. Adrenals/Urinary tract: No evidence of urolithiasis or hydronephrosis. Unremarkable unopacified urinary bladder. Stomach/Bowel: No evidence of obstruction, inflammatory process, or abnormal fluid collections. Diffuse colonic diverticulosis is again noted, however there is no evidence of  diverticulitis. Vascular/Lymphatic: No pathologically enlarged lymph nodes identified. No evidence of abdominal aortic aneurysm. Aortic atherosclerosis noted. Reproductive:  No mass or other significant abnormality. Other: Hemorrhage in the subcutaneous tissues of the right groin nearly completely resolved since previous study. No evidence of acute inflammatory process, abscess, or soft tissue gas. No evidence of hernia or mass. Musculoskeletal: No suspicious bone lesions identified. Old bilateral pubic fracture deformities again noted. IMPRESSION: Near complete resolution of right groin hemorrhage since prior exam. No evidence of acute inflammatory process, abscess, or mass. Colonic diverticulosis. No radiographic evidence of diverticulitis. Aortic Atherosclerosis (ICD10-I70.0). Electronically Signed   By: Marlaine Hind M.D.   On: 12/19/2019 15:58      Medications:     Scheduled Medications: . aspirin EC  81 mg Oral Daily  . carvedilol  6.25 mg Oral BID WC  . cholecalciferol  1,000 Units Oral Daily  . dapagliflozin propanediol  10 mg Oral Daily  . digoxin  0.125 mg Oral Daily  . enoxaparin (LOVENOX) injection  40 mg Subcutaneous Q24H  . gabapentin  600 mg Oral BID  . insulin aspart  0-5 Units Subcutaneous QHS  . insulin aspart  0-9 Units Subcutaneous TID WC  . lidocaine  50 mg Intravenous Once  . linezolid  600 mg Oral BID  . loratadine  10 mg Oral Daily  . metFORMIN  500 mg Oral BID WC  . multivitamin with minerals  1 tablet Oral Daily  . pantoprazole  80 mg Oral Daily  . pravastatin  10 mg Oral q1800  . sacubitril-valsartan  1 tablet Oral BID  . sodium chloride flush  3 mL Intravenous Once  . sodium chloride flush  3 mL Intravenous Q12H  . spironolactone  25 mg Oral Daily     Infusions: . sodium chloride    . lidocaine    . methylPREDNISolone (SOLU-MEDROL) injection 1,000 mg (12/20/19 1059)     PRN Medications:  sodium chloride, acetaminophen, albuterol,  HYDROcodone-acetaminophen, ondansetron (ZOFRAN) IV, ondansetron, sodium chloride flush   Assessment/Plan   1. Chronic systolic CHF: Nonischemic cardiomyopathy. Echo last admission in 7/21 with EF newly decreased to 20-25% with septal-lateral dyssynchrony and septal severe hypokinesis, normal RV, moderate TR, moderate MR. Echo in 5/20 prior to PPM placement showed EF 60-65%. Possible causes of cardiomyopathy included cardiac sarcoidosis, chronic RV pacing, frequent NSVT/PVCs, ?CAD (has RFs), ?viral myocarditis (relatively recent COVID-19 infection). No ETOH/drugs. Developed shock overnight 11/28/19 w/ hypotension and narrow pulse pressure, w/ subsequent AKI, SCr 1.18>>1.85>>2.26. Inotropes/pressors limited by ventricular ectopy.  LHC/RHC 7/23 with no significant CAD, elevated filling pressures, and low cardiac output on norepinephrine. IABP placed. IABP removed 7/26. cMRI 7/27 showed patchy LGE in the basal septum, basal-mid inferoseptum/inferior wall, basal anterior wall, mid anterolateral wall. Cardiac MRI LGE pattern could be consistent with cardiac sarcoidosis (would explain earlier CHB). Alternatively, prior viral myocarditis (recent COVID-19). CT chest with ground glass lower lobes but no evidence for pulmonary sarcoidosis. She is now s/p device upgrade to MDT CRT-D. This admission, she is not  volume overloaded on exam and has NYHA class II symptoms.  I think that her HF is fairly well-compensated, recurrent ventricular arrhythmias appears to be the main issue.  -She does not need a loop diuretic for now.  - Continue digoxin 0.125 daily. Dig level ok - Continue spironolactone 25 mg daily  - Continue Entresto 49/51 mg bid - Continue Coreg 6.25 mg bid.  If she has BP room, would increase given VT (SBP 100s currently, will not change).  -Continuedapagliflozin 10 mg daily.  - Starting pulse Solumedrol 1 g IV daily x 5 days for treatment of cardiac sarcoidosis.  - Will need [facility]APfor cardiac sarcoidosis to confirm diagnosis.    - Cardiomyopathy likely worsened by frequent VT/PVCs. Now on mexiletine to suppress.  2. Recurrent ventricular arrhythmias: She has had VT with multiple morphologies. Increased ventricular arrhythmias over 6 months. ?Underlying cause. No significant coronary disease. Concerned that she has cardiac sarcoidosis (would also explain CHB). Amiodarone stopped in the past due to torsades and very long QTc on amiodarone.  Now on mexiletine. Recurrent ventricular arrhythmias since decreasing prednisone makes me worry that she does indeed have cardiac sarcoidosis. More sustained VT this morning, pace-terminated.   - Stop mexiletine, will transition to lidocaine with 50 mg bolus then 1 mg gtt. Transfer to CCU. Check level in am.  - Titrate up Coreg if able.  - Keep K > 4.0 Mg > 2.0 - Transitioned to IV Solumedrol as above.  - Will consider starting methotrexate per discussion with Dr. Caryl Comes.  - She is now s/p ICD upgrade.  3. H/o CHB: Cause is uncertain. Cath in 5/20 prior to PPM showed no significant disease. Possible cardiac sarcoidosis by cMRI but no evidence for pulmonary sarcoidosis. Needs cardiac PET scan for further evaluation for sarcoidosis.  4. Right Groin Hematoma => wound infection: Hematoma developed 7/26 at IABP site after removal. She had a Femstop.  Hematoma resolved, but recently noted to have oozing at wound site.  MRSA grew from culture and linezolid was started.  CT abdomen/pelvis today showed no abscess.  - Sent blood cultures.  - Continue linezolid.   Length of Stay: 1  Loralie Champagne, MD  12/20/2019, 11:56 AM  Advanced Heart Failure Team Pager [phone] (M-F; 7a - 4p)  Please contact [facility] for night-coverage after hours (4p -7a ) and weekends on [email or web address]

## 2019-12-20 NOTE — Progress Notes (Signed)
ANTICOAGULATION CONSULT NOTE - Initial Consult  Pharmacy Consult for heparin  Indication: DVT  Allergies  Allergen Reactions  . Lisinopril Other (See Comments) and Cough    Flu like symptoms, also   . Ace Inhibitors Other (See Comments)    Flu-like symptoms  . Augmentin [Amoxicillin-Pot Clavulanate] Nausea And Vomiting  . Benazepril Other (See Comments)    Flu like symptoms  . Oxycodone-Acetaminophen Nausea And Vomiting  . Sulfa Antibiotics Nausea And Vomiting    Patient Measurements: Height: 5' (152.4 cm) Weight: 77 kg (169 lb 11.2 oz) IBW/kg (Calculated) : 45.5 Heparin Dosing Weight: 62.9 kg   Vital Signs: Temp: 97.6 F (36.4 C) (08/11 1241) Temp Source: Oral (08/11 1241) BP: 87/65 (08/11 1900) Pulse Rate: 78 (08/11 1900)  Labs: Recent Labs    12/19/19 1135 12/20/19 0634  HGB 13.7 13.3  HCT 45.7 44.1  PLT 313 315  CREATININE 0.85 0.89    Estimated Creatinine Clearance: 65.5 mL/min (by C-G formula based on SCr of 0.89 mg/dL).   Medical History: Past Medical History:  Diagnosis Date  . Aortic insufficiency   . Asthma 09/13/2006  . Complete heart block ([place]) 02/11/2019  . Cystocele without uterine prolapse 12/12/2018  . Diabetes mellitus without complication ([place])   . Elevated troponin level   . Hypercholesteremia   . Hypertension   . Primary osteoarthritis of left knee 12/10/2017    Medications:  Medications Prior to Admission  Medication Sig Dispense Refill Last Dose  . albuterol (PROVENTIL HFA;VENTOLIN HFA) 108 (90 Base) MCG/ACT inhaler Inhale 1-2 puffs into the lungs every 6 (six) hours as needed for wheezing or shortness of breath. 1 Inhaler 0 Past Month at Unknown time  . aspirin EC 81 MG tablet Take 81 mg by mouth daily. Swallow whole.   12/18/2019 at Unknown time  . Biotin 10 MG CAPS Take 10 mg by mouth daily.   12/18/2019 at Unknown time  . carvedilol (COREG) 6.25 MG tablet Take 1 tablet (6.25 mg total) by mouth 2 (two) times daily with a meal. 60 tablet  1 12/18/2019 at 0800  . cetirizine (ZYRTEC) 10 MG tablet Take 10 mg by mouth daily.   unk  . Cholecalciferol (VITAMIN D-3) 25 MCG (1000 UT) CAPS Take 1,000 Units by mouth daily.    Past Month at Unknown time  . dapagliflozin propanediol (FARXIGA) 10 MG TABS tablet Take 1 tablet (10 mg total) by mouth daily. 30 tablet 6 12/18/2019 at Unknown time  . digoxin (LANOXIN) 0.125 MG tablet Take 1 tablet (0.125 mg total) by mouth daily. 30 tablet 6 12/18/2019 at Unknown time  . gabapentin (NEURONTIN) 600 MG tablet Take 600 mg by mouth 2 (two) times daily.   12/18/2019 at Unknown time  . HYDROcodone-acetaminophen (NORCO) 10-325 MG tablet Take 1 tablet by mouth 4 (four) times daily.    12/18/2019 at Unknown time  . linezolid (ZYVOX) 600 MG tablet Take 1 tablet (600 mg total) by mouth 2 (two) times daily for 10 days. 20 tablet 0 12/18/2019 at Unknown time  . meloxicam (MOBIC) 15 MG tablet Take 15 mg by mouth daily.   12/18/2019 at Unknown time  . metFORMIN (GLUCOPHAGE) 500 MG tablet Take 500 mg by mouth 2 (two) times daily.   12/18/2019 at Unknown time  . mexiletine (MEXITIL) 150 MG capsule Take 1 capsule (150 mg total) by mouth every 12 (twelve) hours. 60 capsule 6 12/18/2019 at Unknown time  . Multiple Vitamins-Minerals (CENTRUM SILVER 50+WOMEN) TABS Take 1 tablet by mouth  daily.   12/18/2019 at Unknown time  . naloxone (NARCAN) 4 MG/0.1ML LIQD nasal spray kit Place 1 spray into the nose as directed.   unk  . omeprazole (PRILOSEC) 40 MG capsule Take 40 mg by mouth daily.   12/18/2019 at Unknown time  . ondansetron (ZOFRAN ODT) 4 MG disintegrating tablet Take 1 tablet (4 mg total) by mouth every 8 (eight) hours as needed for nausea or vomiting. 20 tablet 0 unk  . pravastatin (PRAVACHOL) 10 MG tablet Take 1 tablet (10 mg total) by mouth daily at 6 PM. 30 tablet 5 12/18/2019 at Unknown time  . predniSONE (DELTASONE) 10 MG tablet Take 10 mg by mouth daily with breakfast.   12/18/2019 at Unknown time  . sacubitril-valsartan (ENTRESTO)  49-51 MG Take 1 tablet by mouth 2 (two) times daily. 60 tablet 6 12/18/2019 at Unknown time  . spironolactone (ALDACTONE) 25 MG tablet Take 1 tablet (25 mg total) by mouth daily. 30 tablet 6 12/18/2019 at Unknown time  . venlafaxine XR (EFFEXOR XR) 150 MG 24 hr capsule Take 1 capsule (150 mg total) by mouth daily with breakfast. 90 capsule 3 Past Week at Unknown time  . ACCU-CHEK AVIVA PLUS test strip 3 (three) times daily.     . predniSONE (DELTASONE) 20 MG tablet Take 0.5 tablets (10 mg total) by mouth daily with breakfast. (Patient not taking: Reported on 12/19/2019) 30 tablet 1 Not Taking at Unknown time    Assessment: 11 YOF with new RIJ DVT to start IV heparin. Of note, patient is currently SQ Lovenox 40 mg daily. Last dose was today at 1416. SCr wnl. H/H and Plt wnl   Goal of Therapy:  Heparin level 0.3-0.7 units/ml Monitor platelets by anticoagulation protocol: Yes   Plan:  -Heparin 4000 units IV bolus then start iv heparin at 1050 units/hr  -F/u 6 hr HL  -Monitor daily HL, CBC and s/s of bleeding   Albertina Parr, PharmD., BCPS, BCCCP Clinical Pharmacist Clinical phone for 12/20/19 until 11pm: [phone] If after 11pm, please refer to [facility] for unit-specific pharmacist

## 2019-12-20 NOTE — Progress Notes (Addendum)
Electrophysiology Rounding Note  Patient Name: Lisa Poole Date of Encounter: 12/20/2019  Primary Cardiologist: Dr. Aundra Dubin Electrophysiologist: Dr. Lovena Le   Subjective   The patient is feeling OK this am. Continue to have multiple episodes of VT yesterday evening.   Inpatient Medications    Scheduled Meds:  aspirin EC  81 mg Oral Daily   carvedilol  6.25 mg Oral BID WC   cholecalciferol  1,000 Units Oral Daily   dapagliflozin propanediol  10 mg Oral Daily   digoxin  0.125 mg Oral Daily   enoxaparin (LOVENOX) injection  40 mg Subcutaneous Q24H   gabapentin  600 mg Oral BID   insulin aspart  0-5 Units Subcutaneous QHS   insulin aspart  0-9 Units Subcutaneous TID WC   linezolid  600 mg Oral BID   loratadine  10 mg Oral Daily   metFORMIN  500 mg Oral BID WC   mexiletine  150 mg Oral Q12H   multivitamin with minerals  1 tablet Oral Daily   pantoprazole  80 mg Oral Daily   pravastatin  10 mg Oral q1800   sacubitril-valsartan  1 tablet Oral BID   sodium chloride flush  3 mL Intravenous Once   sodium chloride flush  3 mL Intravenous Q12H   spironolactone  25 mg Oral Daily   Continuous Infusions:  sodium chloride     methylPREDNISolone (SOLU-MEDROL) injection     PRN Meds: sodium chloride, acetaminophen, albuterol, HYDROcodone-acetaminophen, ondansetron (ZOFRAN) IV, ondansetron, sodium chloride flush   Vital Signs    Vitals:   12/19/19 2002 12/19/19 2100 12/20/19 0039 12/20/19 0454  BP: 111/72 105/70 112/75 97/63  Pulse: 86  86 78  Resp: 16  15 17   Temp: 98.3 F (36.8 C)  98.2 F (36.8 C) 97.8 F (36.6 C)  TempSrc: Oral  Oral Oral  SpO2: 96%  96% 97%  Weight:    77 kg  Height:        Intake/Output Summary (Last 24 hours) at 12/20/2019 0656 Last data filed at 12/20/2019 0456 Gross per 24 hour  Intake 360 ml  Output 1150 ml  Net -790 ml   Filed Weights   12/19/19 1035 12/20/19 0454  Weight: 77.1 kg 77 kg    Physical Exam      GEN- The patient is well appearing, alert and oriented x 3 today.   Head- normocephalic, atraumatic Eyes-  Sclera clear, conjunctiva pink Ears- hearing intact Oropharynx- clear Neck- supple Lungs- Clear to ausculation bilaterally, normal work of breathing Heart- Regular rate and rhythm, no murmurs, rubs or gallops GI- soft, NT, ND, + BS Extremities- no clubbing or cyanosis. No edema Skin- no rash or lesion Psych- euthymic mood, full affect Neuro- strength and sensation are intact  Labs    CBC Recent Labs    12/19/19 1135  WBC 6.5  HGB 13.7  HCT 45.7  MCV 90.9  PLT 599   Basic Metabolic Panel Recent Labs    12/19/19 1135  NA 141  K 4.0  CL 104  CO2 25  GLUCOSE 112*  BUN 22*  CREATININE 0.85  CALCIUM 9.1  MG 1.8   Liver Function Tests No results for input(s): AST, ALT, ALKPHOS, BILITOT, PROT, ALBUMIN in the last 72 hours. No results for input(s): LIPASE, AMYLASE in the last 72 hours. Cardiac Enzymes No results for input(s): CKTOTAL, CKMB, CKMBINDEX, TROPONINI in the last 72 hours.   Telemetry    NSR mostly 80-90s with PVCs. Pt did have continued intermittent  VT in the evening yesterday (at least 3 episodes in 140/150s below detection, all converted spontaneously) (personally reviewed)  Radiology    CT ABDOMEN PELVIS WO CONTRAST  Result Date: 12/19/2019 CLINICAL DATA:  Right groin pain, erythema, and drainage. Recent removal of intra-aortic balloon pump. Suspect soft tissue infection. EXAM: CT ABDOMEN AND PELVIS WITHOUT CONTRAST TECHNIQUE: Multidetector CT imaging of the abdomen and pelvis was performed following the standard protocol without IV contrast. COMPARISON:  12/04/2019 FINDINGS: Lower chest: No acute findings. Hepatobiliary: No mass visualized on this unenhanced exam. Gallbladder is unremarkable. No evidence of biliary ductal dilatation. Pancreas: No mass or inflammatory process visualized on this unenhanced exam. Spleen:  Within normal limits in size.  Adrenals/Urinary tract: No evidence of urolithiasis or hydronephrosis. Unremarkable unopacified urinary bladder. Stomach/Bowel: No evidence of obstruction, inflammatory process, or abnormal fluid collections. Diffuse colonic diverticulosis is again noted, however there is no evidence of diverticulitis. Vascular/Lymphatic: No pathologically enlarged lymph nodes identified. No evidence of abdominal aortic aneurysm. Aortic atherosclerosis noted. Reproductive:  No mass or other significant abnormality. Other: Hemorrhage in the subcutaneous tissues of the right groin nearly completely resolved since previous study. No evidence of acute inflammatory process, abscess, or soft tissue gas. No evidence of hernia or mass. Musculoskeletal: No suspicious bone lesions identified. Old bilateral pubic fracture deformities again noted. IMPRESSION: Near complete resolution of right groin hemorrhage since prior exam. No evidence of acute inflammatory process, abscess, or mass. Colonic diverticulosis. No radiographic evidence of diverticulitis. Aortic Atherosclerosis (ICD10-I70.0). Electronically Signed   By: Marlaine Hind M.D.   On: 12/19/2019 15:58   DG Chest 2 View  Result Date: 12/19/2019 CLINICAL DATA:  Weakness.  Cardiac arrhythmia EXAM: CHEST - 2 VIEW COMPARISON:  December 08, 2019 FINDINGS: Pacemaker present with stable positioning of lead tips. Heart size and pulmonary vascularity are normal. Lungs are clear. There is no adenopathy. Note that there are nonenlarged calcified lymph nodes in the left hilum and aortopulmonary window regions. No bone lesions. IMPRESSION: Calcified lymph nodes consistent with prior granulomatous disease. No edema or airspace opacity. Stable cardiac silhouette and pacemaker lead positioning. Electronically Signed   By: Lowella Grip III M.D.   On: 12/19/2019 11:22   CUP PACEART INCLINIC DEVICE CHECK  Result Date: 12/19/2019 Wound check appointment. Steri-strips removed. Wound without redness  or edema. Incision edges approximated, stitch removed from medial incision. Wound otherwise well healed. Patient educated about signs/symptoms of infection, aware to call if any noted. Normal device function. Thresholds, sensing, and impedances consistent with implant measurements. RA and LV (His) outputs at chronic settings S/P BiV upgrade. RV output at 3.5V with autocapture on for extra safety margin until 3 month visit. Histogram distribution suggests ~20% of ventricular HRs in 130-160bpm range. BiVP 74.0%, likely due to VT/PVCs. No mode switches. 6 FVT episodes and 2 VT episodes since 12/16/19, all treated with 1-2 bursts of ATP, some episodes ongoing below detection after ATP delivered. 214 monitored VT episodes, 90 NSVT episodes. Longest V sensing episode 2hr 30min, markers suggest VT. Most recent treated episode this AM, time stamps inaccurate due to incorrect programmed device time (updated today). Patient reports frequent  episodes of dizziness and palpitations in recent days. On carvedilol, digoxin, and mexiletine. Discussed with Dr. Allred/Dr. Lovena Le, patient to present to [facility] ED for further workup. Patient declines transport by EMS, escorted to private vehicle. Patient's husband aware of plan and will drive her straight to [facility] ED. Device alerts adjusted per protocol. Patient educated about wound care, arm mobility,  lifting restrictions, shock plan, and Carelink monitor. Carelink on 03/08/20, ROV with RU on 12/28/19.Levander Campion BSN, RN, CCDS   Patient Profile     Lisa Poole is a 54 year old woman with a history of ventricular tachycardia, chronic systolic congestive heart failure (ejection fraction 30%), diabetes, hypertension who presents from clinic with an increased ventricular tachycardia burden on her implanted biventricular ICD.  Her ventricular tachycardia episodes have increased in frequency and required multiple ATP therapies.  The patient does recall episodes of ventricular tachycardia and  describes a rapid pounding in her chest.  Admitted for VT storm by device interrogation. Symptomatic with lightheadedness and dizziness   Assessment & Plan    1. VT storm, multiple foci Continued overnight despite increase in po steroids.  Will start pulse dose steroids today with 1g solumedrol x 5 days. Labs/Electrolytes pending this am.  With multiple foci (at least 3 noted on device interrogation) and infection, poor candidate for ablation.  Continue mexiletine 150 mg BID for now while discussing options with ID re: infection and steroids.  Continue coreg 6.25 mg BID as tolerated.   2. Chronic systolic CHF CHF team following.  Will monitor closely for s/s of low output with recurrent VT.  CXR unremarkable Will continue current medications for now.   3. Rt groin wound Appreciate wound care and ID input.  Continue linezolid and wound care for now.   4. HTN Continue home meds and follow up.   5. Mood Pt on Effexor -> holding for risk of serotonin syndrome on Linezolid.   Pt may have refractory headaches, and if so, per ID can consider transition to Doxy to finish ABX course.  Stable.   6. DM2 Sugars have been higher on steroids.  Continue metformin for now.  SSI added and diabetes coordinator consulted.   For questions or updates, please contact [place] Please consult [email or web address] for contact info under Cardiology/STEMI.  Signed, Shirley Friar, PA-C  12/20/2019, 6:56 AM

## 2019-12-20 NOTE — Progress Notes (Signed)
The( A) protocol for methotrexate begins at 10 mg weekly and increased 5mg  every two weeks to a target of 20  Mg. And folic 1 mg daily   This protocol is listed is JAHA 2019, 8 [id number] And the plan would be indefinite immunosuppression She will need PET scanning ASAP

## 2019-12-20 NOTE — Progress Notes (Addendum)
Right upper extremity venous duplex completed. Refer to "CV Proc" under chart review to view preliminary results.  Preliminary results discussed with RN.  12/20/2019 6:39 PM Kelby Aline., MHA, RVT, RDCS, RDMS

## 2019-12-20 NOTE — Procedures (Signed)
Central Venous Catheter Insertion Procedure Note  Lisa Poole  [id number]  01-Mar-1966  Date:12/20/19  Time:3:58 PM   Provider Performing:Dionis Autry   Procedure: Insertion of Non-tunneled Central Venous [phone]) with US guidance ([id number])   Indication(s) Medication administration  Consent Risks of the procedure as well as the alternatives and risks of each were explained to the patient and/or caregiver.  Consent for the procedure was obtained and is signed in the bedside chart  Anesthesia Topical only with 1% lidocaine   Timeout Verified patient identification, verified procedure, site/side was marked, verified correct patient position, special equipment/implants available, medications/allergies/relevant history reviewed, required imaging and test results available.  Sterile Technique Maximal sterile technique including full sterile barrier drape, hand hygiene, sterile gown, sterile gloves, mask, hair covering, sterile ultrasound probe cover (if used).  Procedure Description Area of catheter insertion was cleaned with chlorhexidine and draped in sterile fashion.  With real-time ultrasound guidance a central venous catheter was placed into the left internal jugular vein. Nonpulsatile blood flow and easy flushing noted in all ports.  The catheter was sutured in place and sterile dressing applied.  Complications/Tolerance None; patient tolerated the procedure well. Chest X-ray is ordered to verify placement for internal jugular or subclavian cannulation.   Chest x-ray is not ordered for femoral cannulation.  EBL Minimal  Specimen(s) None

## 2019-12-21 ENCOUNTER — Telehealth: Payer: Self-pay

## 2019-12-21 DIAGNOSIS — I5022 Chronic systolic (congestive) heart failure: Secondary | ICD-10-CM | POA: Diagnosis not present

## 2019-12-21 LAB — BASIC METABOLIC PANEL
Anion gap: 11 (ref 5–15)
BUN: 26 mg/dL — ABNORMAL HIGH (ref 6–20)
CO2: 25 mmol/L (ref 22–32)
Calcium: 9.1 mg/dL (ref 8.9–10.3)
Chloride: 102 mmol/L (ref 98–111)
Creatinine, Ser: 1.04 mg/dL — ABNORMAL HIGH (ref 0.44–1.00)
GFR calc Af Amer: >60 mL/min (ref >60)
GFR calc non Af Amer: >60 mL/min (ref >60)
Glucose, Bld: 209 mg/dL — ABNORMAL HIGH (ref 70–99)
Potassium: 4.2 mmol/L (ref 3.5–5.1)
Sodium: 138 mmol/L (ref 135–145)

## 2019-12-21 LAB — GLUCOSE, CAPILLARY
Glucose-Capillary: 187 mg/dL — ABNORMAL HIGH (ref 70–99)
Glucose-Capillary: 166 mg/dL — ABNORMAL HIGH (ref 70–99)
Glucose-Capillary: 241 mg/dL — ABNORMAL HIGH (ref 70–99)
Glucose-Capillary: 173 mg/dL — ABNORMAL HIGH (ref 70–99)

## 2019-12-21 LAB — LIDOCAINE LEVEL: Lidocaine Lvl: 1.2 ug/mL — ABNORMAL LOW (ref 1.5–5.0)

## 2019-12-21 LAB — CBC
HCT: 41.5 % (ref 36.0–46.0)
Hemoglobin: 12.6 g/dL (ref 12.0–15.0)
MCH: 26.7 pg (ref 26.0–34.0)
MCHC: 30.4 g/dL (ref 30.0–36.0)
MCV: 87.9 fL (ref 80.0–100.0)
Platelets: 289 10*3/uL (ref 150–400)
RBC: 4.72 MIL/uL (ref 3.87–5.11)
RDW: 16.5 % — ABNORMAL HIGH (ref 11.5–15.5)
WBC: 14.2 10*3/uL — ABNORMAL HIGH (ref 4.0–10.5)
nRBC: 0 % (ref 0.0–0.2)

## 2019-12-21 LAB — MAGNESIUM: Magnesium: 2.1 mg/dL (ref 1.7–2.4)

## 2019-12-21 LAB — HEPARIN LEVEL (UNFRACTIONATED)
Heparin Unfractionated: 1 IU/mL — ABNORMAL HIGH (ref 0.30–0.70)
Heparin Unfractionated: 1.06 IU/mL — ABNORMAL HIGH (ref 0.30–0.70)

## 2019-12-21 MED ORDER — INSULIN ASPART 100 UNIT/ML ~~LOC~~ SOLN
0.0000 [IU] | Freq: Three times a day (TID) | SUBCUTANEOUS | Status: DC
  Administered 2019-12-21: 5 [IU] via SUBCUTANEOUS
  Administered 2019-12-21 – 2019-12-22 (×2): 3 [IU] via SUBCUTANEOUS
  Administered 2019-12-23: 5 [IU] via SUBCUTANEOUS
  Administered 2019-12-23: 2 [IU] via SUBCUTANEOUS
  Administered 2019-12-23: 3 [IU] via SUBCUTANEOUS
  Administered 2019-12-24 (×2): 5 [IU] via SUBCUTANEOUS
  Administered 2019-12-25 (×2): 3 [IU] via SUBCUTANEOUS
  Administered 2019-12-25: 5 [IU] via SUBCUTANEOUS
  Administered 2019-12-26: 3 [IU] via SUBCUTANEOUS
  Administered 2019-12-26: 5 [IU] via SUBCUTANEOUS
  Administered 2019-12-27: 3 [IU] via SUBCUTANEOUS

## 2019-12-21 MED ORDER — HEPARIN (PORCINE) 25000 UT/250ML-% IV SOLN
700.0000 [IU]/h | INTRAVENOUS | Status: DC
  Administered 2019-12-21: 800 [IU]/h via INTRAVENOUS
  Filled 2019-12-21: qty 250

## 2019-12-21 MED ORDER — INSULIN ASPART 100 UNIT/ML ~~LOC~~ SOLN
0.0000 [IU] | Freq: Every day | SUBCUTANEOUS | Status: DC
  Administered 2019-12-22: 3 [IU] via SUBCUTANEOUS
  Administered 2019-12-23 – 2019-12-26 (×4): 2 [IU] via SUBCUTANEOUS

## 2019-12-21 MED ORDER — SODIUM CHLORIDE 0.9% FLUSH
10.0000 mL | Freq: Two times a day (BID) | INTRAVENOUS | Status: DC
  Administered 2019-12-21 – 2019-12-25 (×7): 10 mL

## 2019-12-21 MED ORDER — SODIUM CHLORIDE 0.9% FLUSH: 10.0000 mL | INTRAVENOUS | Status: DC | PRN

## 2019-12-21 NOTE — Progress Notes (Signed)
[facility] for Heparin  Indication: DVT  Allergies  Allergen Reactions  . Lisinopril Other (See Comments) and Cough    Flu like symptoms, also   . Ace Inhibitors Other (See Comments)    Flu-like symptoms  . Augmentin [Amoxicillin-Pot Clavulanate] Nausea And Vomiting  . Benazepril Other (See Comments)    Flu like symptoms  . Oxycodone-Acetaminophen Nausea And Vomiting  . Sulfa Antibiotics Nausea And Vomiting    Patient Measurements: Height: 5' (152.4 cm) Weight: 78.6 kg (173 lb 4.5 oz) IBW/kg (Calculated) : 45.5 Heparin Dosing Weight: 62.9 kg   Vital Signs: Temp: 97.9 F (36.6 C) (08/12 0730) Temp Source: Oral (08/12 0730) BP: 126/88 (08/12 1518) Pulse Rate: 70 (08/12 1518)  Labs: Recent Labs    12/19/19 1135 12/19/19 1135 12/20/19 0634 12/21/19 0421 12/21/19 1400  HGB 13.7   < > 13.3 12.6  --   HCT 45.7  --  44.1 41.5  --   PLT 313  --  315 289  --   HEPARINUNFRC  --   --   --  1.00* 1.06*  CREATININE 0.85  --  0.89 1.04*  --    < > = values in this interval not displayed.    Estimated Creatinine Clearance: 56.6 mL/min (A) (by C-G formula based on SCr of 1.04 mg/dL (H)).   Medical History: Past Medical History:  Diagnosis Date  . Aortic insufficiency   . Asthma 09/13/2006  . Complete heart block ([place]) 02/11/2019  . Cystocele without uterine prolapse 12/12/2018  . Diabetes mellitus without complication ([place])   . Elevated troponin level   . Hypercholesteremia   . Hypertension   . Primary osteoarthritis of left knee 12/10/2017    Medications:  Medications Prior to Admission  Medication Sig Dispense Refill Last Dose  . albuterol (PROVENTIL HFA;VENTOLIN HFA) 108 (90 Base) MCG/ACT inhaler Inhale 1-2 puffs into the lungs every 6 (six) hours as needed for wheezing or shortness of breath. 1 Inhaler 0 Past Month at Unknown time  . aspirin EC 81 MG tablet Take 81 mg by mouth daily. Swallow whole.   12/18/2019 at Unknown time  .  Biotin 10 MG CAPS Take 10 mg by mouth daily.   12/18/2019 at Unknown time  . carvedilol (COREG) 6.25 MG tablet Take 1 tablet (6.25 mg total) by mouth 2 (two) times daily with a meal. 60 tablet 1 12/18/2019 at 0800  . cetirizine (ZYRTEC) 10 MG tablet Take 10 mg by mouth daily.   unk  . Cholecalciferol (VITAMIN D-3) 25 MCG (1000 UT) CAPS Take 1,000 Units by mouth daily.    Past Month at Unknown time  . dapagliflozin propanediol (FARXIGA) 10 MG TABS tablet Take 1 tablet (10 mg total) by mouth daily. 30 tablet 6 12/18/2019 at Unknown time  . digoxin (LANOXIN) 0.125 MG tablet Take 1 tablet (0.125 mg total) by mouth daily. 30 tablet 6 12/18/2019 at Unknown time  . gabapentin (NEURONTIN) 600 MG tablet Take 600 mg by mouth 2 (two) times daily.   12/18/2019 at Unknown time  . HYDROcodone-acetaminophen (NORCO) 10-325 MG tablet Take 1 tablet by mouth 4 (four) times daily.    12/18/2019 at Unknown time  . linezolid (ZYVOX) 600 MG tablet Take 1 tablet (600 mg total) by mouth 2 (two) times daily for 10 days. 20 tablet 0 12/18/2019 at Unknown time  . meloxicam (MOBIC) 15 MG tablet Take 15 mg by mouth daily.   12/18/2019 at Unknown time  .  metFORMIN (GLUCOPHAGE) 500 MG tablet Take 500 mg by mouth 2 (two) times daily.   12/18/2019 at Unknown time  . mexiletine (MEXITIL) 150 MG capsule Take 1 capsule (150 mg total) by mouth every 12 (twelve) hours. 60 capsule 6 12/18/2019 at Unknown time  . Multiple Vitamins-Minerals (CENTRUM SILVER 50+WOMEN) TABS Take 1 tablet by mouth daily.   12/18/2019 at Unknown time  . naloxone (NARCAN) 4 MG/0.1ML LIQD nasal spray kit Place 1 spray into the nose as directed.   unk  . omeprazole (PRILOSEC) 40 MG capsule Take 40 mg by mouth daily.   12/18/2019 at Unknown time  . ondansetron (ZOFRAN ODT) 4 MG disintegrating tablet Take 1 tablet (4 mg total) by mouth every 8 (eight) hours as needed for nausea or vomiting. 20 tablet 0 unk  . pravastatin (PRAVACHOL) 10 MG tablet Take 1 tablet (10 mg total) by mouth daily at  6 PM. 30 tablet 5 12/18/2019 at Unknown time  . predniSONE (DELTASONE) 10 MG tablet Take 10 mg by mouth daily with breakfast.   12/18/2019 at Unknown time  . sacubitril-valsartan (ENTRESTO) 49-51 MG Take 1 tablet by mouth 2 (two) times daily. 60 tablet 6 12/18/2019 at Unknown time  . spironolactone (ALDACTONE) 25 MG tablet Take 1 tablet (25 mg total) by mouth daily. 30 tablet 6 12/18/2019 at Unknown time  . venlafaxine XR (EFFEXOR XR) 150 MG 24 hr capsule Take 1 capsule (150 mg total) by mouth daily with breakfast. 90 capsule 3 Past Week at Unknown time  . ACCU-CHEK AVIVA PLUS test strip 3 (three) times daily.     . predniSONE (DELTASONE) 20 MG tablet Take 0.5 tablets (10 mg total) by mouth daily with breakfast. (Patient not taking: Reported on 12/19/2019) 30 tablet 1 Not Taking at Unknown time    Assessment: 39 YOF with new RIJ DVT to start IV heparin. Of note, patient is currently SQ Lovenox 40 mg daily.  Heparin level was supratherapeutic at 1.06 - drawn correctly given heparin running through [facility] and level drawn from central line. Hgb 12.6, plt 289. No s/sx of bleeding or infusion issues.  Goal of Therapy:  Heparin level 0.3-0.7 units/ml Monitor platelets by anticoagulation protocol: Yes   Plan:  -Hold heparin 1 hour then reduce heparin infusion to 800 units/hr  -Order heparin level 6 hour after restart -Monitor HL, CBC, and for s/sx of bleeding   Antonietta Jewel, PharmD, [place] Pharmacist  Phone: [phone] 12/21/2019 4:03 PM  Please check AMION for all [place] phone numbers After 10:00 PM, call [place] [phone]

## 2019-12-21 NOTE — Progress Notes (Signed)
Electrophysiology Rounding Note  Patient Name: Lisa Poole Date of Encounter: 12/21/2019  Primary Cardiologist: Dr. Aundra Dubin  Electrophysiologist: Dr. Lovena Le   Subjective   The patient is doing well today.  Left central line placed yesterday for lidocaine (recommended per pharmacy)    Inpatient Medications    Scheduled Meds: . aspirin EC  81 mg Oral Daily  . carvedilol  6.25 mg Oral BID WC  . Chlorhexidine Gluconate Cloth  6 each Topical Daily  . cholecalciferol  1,000 Units Oral Daily  . dapagliflozin propanediol  10 mg Oral Daily  . digoxin  0.125 mg Oral Daily  . folic acid  1 mg Oral Daily  . gabapentin  600 mg Oral BID  . insulin aspart  0-5 Units Subcutaneous QHS  . insulin aspart  0-9 Units Subcutaneous TID WC  . lidocaine  50 mg Intravenous Once  . linezolid  600 mg Oral BID  . loratadine  10 mg Oral Daily  . metFORMIN  500 mg Oral BID WC  . methotrexate  10 mg Oral Weekly  . multivitamin with minerals  1 tablet Oral Daily  . pantoprazole  80 mg Oral Daily  . pravastatin  10 mg Oral q1800  . sacubitril-valsartan  1 tablet Oral BID  . sodium chloride flush  10-40 mL Intracatheter Q12H  . sodium chloride flush  3 mL Intravenous Once  . sodium chloride flush  3 mL Intravenous Q12H  . spironolactone  25 mg Oral Daily   Continuous Infusions: . sodium chloride    . heparin 900 Units/hr (12/21/19 0609)  . lidocaine 1 mg/min (12/21/19 0609)  . methylPREDNISolone (SOLU-MEDROL) injection Stopped (12/20/19 1159)   PRN Meds: sodium chloride, acetaminophen, albuterol, fentaNYL (SUBLIMAZE) injection, HYDROcodone-acetaminophen, ondansetron (ZOFRAN) IV, ondansetron, sodium chloride flush, sodium chloride flush   Vital Signs    Vitals:   12/21/19 0400 12/21/19 0500 12/21/19 0600 12/21/19 0641  BP: (!) 88/57 (!) 89/55 (!) 87/60   Pulse: 78 82 77   Resp: 18 18 (!) 21   Temp: 98.6 F (37 C)     TempSrc: Oral     SpO2: 94% 95% 96%   Weight:    78.6 kg  Height:         Intake/Output Summary (Last 24 hours) at 12/21/2019 0701 Last data filed at 12/21/2019 0609 Gross per 24 hour  Intake 1087.73 ml  Output 200 ml  Net 887.73 ml   Filed Weights   12/19/19 1035 12/20/19 0454 12/21/19 0641  Weight: 77.1 kg 77 kg 78.6 kg    Physical Exam    GEN- The patient is well appearing, alert and oriented x 3 today.   Head- normocephalic, atraumatic Eyes-  Sclera clear, conjunctiva pink Ears- hearing intact Oropharynx- clear Neck- supple. LIJ central line Lungs- Clear to ausculation bilaterally, normal work of breathing Heart- Regular rate and rhythm, no murmurs, rubs or gallops GI- soft, NT, ND, + BS Extremities- no clubbing or cyanosis. No edema Skin- no rash or lesion Psych- euthymic mood, full affect Neuro- strength and sensation are intact  Labs    CBC Recent Labs    12/20/19 0634 12/21/19 0421  WBC 5.2 14.2*  NEUTROABS 4.1  --   HGB 13.3 12.6  HCT 44.1 41.5  MCV 87.8 87.9  PLT 315 329   Basic Metabolic Panel Recent Labs    12/20/19 0634 12/21/19 0421  NA 139 138  K 4.5 4.2  CL 103 102  CO2 25 25  GLUCOSE 243*  209*  BUN 20 26*  CREATININE 0.89 1.04*  CALCIUM 9.2 9.1  MG 2.3 2.1   Liver Function Tests No results for input(s): AST, ALT, ALKPHOS, BILITOT, PROT, ALBUMIN in the last 72 hours. No results for input(s): LIPASE, AMYLASE in the last 72 hours. Cardiac Enzymes No results for input(s): CKTOTAL, CKMB, CKMBINDEX, TROPONINI in the last 72 hours.   Telemetry    NSR/V pacing 70-80s, short run of "tachy" in 110-140s which quickly resolved. (personally reviewed)  Radiology    CT ABDOMEN PELVIS WO CONTRAST  Result Date: 12/19/2019 CLINICAL DATA:  Right groin pain, erythema, and drainage. Recent removal of intra-aortic balloon pump. Suspect soft tissue infection. EXAM: CT ABDOMEN AND PELVIS WITHOUT CONTRAST TECHNIQUE: Multidetector CT imaging of the abdomen and pelvis was performed following the standard protocol without IV  contrast. COMPARISON:  12/04/2019 FINDINGS: Lower chest: No acute findings. Hepatobiliary: No mass visualized on this unenhanced exam. Gallbladder is unremarkable. No evidence of biliary ductal dilatation. Pancreas: No mass or inflammatory process visualized on this unenhanced exam. Spleen:  Within normal limits in size. Adrenals/Urinary tract: No evidence of urolithiasis or hydronephrosis. Unremarkable unopacified urinary bladder. Stomach/Bowel: No evidence of obstruction, inflammatory process, or abnormal fluid collections. Diffuse colonic diverticulosis is again noted, however there is no evidence of diverticulitis. Vascular/Lymphatic: No pathologically enlarged lymph nodes identified. No evidence of abdominal aortic aneurysm. Aortic atherosclerosis noted. Reproductive:  No mass or other significant abnormality. Other: Hemorrhage in the subcutaneous tissues of the right groin nearly completely resolved since previous study. No evidence of acute inflammatory process, abscess, or soft tissue gas. No evidence of hernia or mass. Musculoskeletal: No suspicious bone lesions identified. Old bilateral pubic fracture deformities again noted. IMPRESSION: Near complete resolution of right groin hemorrhage since prior exam. No evidence of acute inflammatory process, abscess, or mass. Colonic diverticulosis. No radiographic evidence of diverticulitis. Aortic Atherosclerosis (ICD10-I70.0). Electronically Signed   By: Marlaine Hind M.D.   On: 12/19/2019 15:58   DG Chest 2 View  Result Date: 12/19/2019 CLINICAL DATA:  Weakness.  Cardiac arrhythmia EXAM: CHEST - 2 VIEW COMPARISON:  December 08, 2019 FINDINGS: Pacemaker present with stable positioning of lead tips. Heart size and pulmonary vascularity are normal. Lungs are clear. There is no adenopathy. Note that there are nonenlarged calcified lymph nodes in the left hilum and aortopulmonary window regions. No bone lesions. IMPRESSION: Calcified lymph nodes consistent with prior  granulomatous disease. No edema or airspace opacity. Stable cardiac silhouette and pacemaker lead positioning. Electronically Signed   By: Lowella Grip III M.D.   On: 12/19/2019 11:22   DG CHEST PORT 1 VIEW  Result Date: 12/20/2019 CLINICAL DATA:  Central line placement EXAM: PORTABLE CHEST 1 VIEW COMPARISON:  12/19/2019 FINDINGS: Left jugular central venous catheter tip in the lower SVC. No pneumothorax. The catheter is not well seen due to overlying pacemaker leads. Heart size and vascularity within normal limits. Calcified AP window lymph nodes unchanged. Dual lead pacemaker/AICD unchanged in position. Negative for heart failure infiltrate or effusion. Chronic rotator cuff disease with narrowing of the acromial humeral distance. Widening the [facility] joint on the right may be due to trauma or prior surgical resection distal clavicle IMPRESSION: Central line in the SVC.  No pneumothorax. Lungs remain clear. Electronically Signed   By: Franchot Gallo M.D.   On: 12/20/2019 17:13   CUP PACEART INCLINIC DEVICE CHECK  Result Date: 12/19/2019 Wound check appointment. Steri-strips removed. Wound without redness or edema. Incision edges approximated, stitch removed from medial  incision. Wound otherwise well healed. Patient educated about signs/symptoms of infection, aware to call if any noted. Normal device function. Thresholds, sensing, and impedances consistent with implant measurements. RA and LV (His) outputs at chronic settings S/P BiV upgrade. RV output at 3.5V with autocapture on for extra safety margin until 3 month visit. Histogram distribution suggests ~20% of ventricular HRs in 130-160bpm range. BiVP 74.0%, likely due to VT/PVCs. No mode switches. 6 FVT episodes and 2 VT episodes since 12/16/19, all treated with 1-2 bursts of ATP, some episodes ongoing below detection after ATP delivered. 214 monitored VT episodes, 90 NSVT episodes. Longest V sensing episode 2hr 53min, markers suggest VT. Most recent  treated episode this AM, time stamps inaccurate due to incorrect programmed device time (updated today). Patient reports frequent  episodes of dizziness and palpitations in recent days. On carvedilol, digoxin, and mexiletine. Discussed with Dr. Allred/Dr. Lovena Le, patient to present to [facility] ED for further workup. Patient declines transport by EMS, escorted to private vehicle. Patient's husband aware of plan and will drive her straight to [facility] ED. Device alerts adjusted per protocol. Patient educated about wound care, arm mobility, lifting restrictions, shock plan, and Carelink monitor. Carelink on 03/08/20, ROV with RU on 12/28/19.Levander Campion BSN, RN, CCDS  VAS [place] UPPER EXTREMITY VENOUS DUPLEX  Result Date: 12/20/2019 UPPER VENOUS STUDY  Indications: Right IJV clot seen during sterile procedure Comparison Study: No prior study Performing Technologist: Maudry Mayhew MHA, RDMS, RVT, RDCS  Examination Guidelines: A complete evaluation includes B-mode imaging, spectral Doppler, color Doppler, and power Doppler as needed of all accessible portions of each vessel. Bilateral testing is considered an integral part of a complete examination. Limited examinations for reoccurring indications may be performed as noted.  Right Findings: +----------+------------+---------+-----------+--------------+----------------+ RIGHT     CompressiblePhasicitySpontaneous  Properties      Summary      +----------+------------+---------+-----------+--------------+----------------+ IJV           None                 No         softly          Age                                                    echogenic    Indeterminate   +----------+------------+---------+-----------+--------------+----------------+ Subclavian    Full       Yes       Yes                                   +----------+------------+---------+-----------+--------------+----------------+ Axillary      Full       Yes       Yes                                    +----------+------------+---------+-----------+--------------+----------------+ Brachial      Full       Yes       Yes                                   +----------+------------+---------+-----------+--------------+----------------+ Radial  Full                                                       +----------+------------+---------+-----------+--------------+----------------+ Ulnar         Full                                                       +----------+------------+---------+-----------+--------------+----------------+ Cephalic      Full                                                       +----------+------------+---------+-----------+--------------+----------------+ Basilic                                                  Not visualized  +----------+------------+---------+-----------+--------------+----------------+  Left Findings: +----------+------------+---------+-----------+----------+-------+ LEFT      CompressiblePhasicitySpontaneousPropertiesSummary +----------+------------+---------+-----------+----------+-------+ Subclavian               Yes       Yes                      +----------+------------+---------+-----------+----------+-------+  Summary:  Right: Findings consistent with age indeterminate deep vein thrombosis involving the right internal jugular vein.  Left: No evidence of thrombosis in the subclavian.  *See table(s) above for measurements and observations.  Diagnosing physician: Curt Jews MD Electronically signed by Curt Jews MD on 12/20/2019 at 10:24:50 PM.    Final     Patient Profile     Ms. Kinoshita is a 54 year old woman with a history of ventricular tachycardia, chronic systolic congestive heart failure (ejection fraction 30%), diabetes, hypertension who presents from clinic with an increased ventricular tachycardia burden on her implanted biventricular ICD. Her ventricular tachycardia episodes have  increased in frequency and required multiple ATP therapies. The patient does recall episodes of ventricular tachycardia and describes a rapid pounding in her chest.  Admitted for VT storm by device interrogation. Symptomatic with lightheadedness and dizziness   Assessment & Plan    1. VT storm, multiple foci No further apart from a very short run overnight.  Continue lidocaine gtt as well. Will discuss timing for wean with Dr. Quentin Ore.  Continue 1 solumedrol (day 2 of 5) With multiple foci (at least 3 noted on device interrogation) and infection, poor candidate for ablation.  Continue coreg 6.25 mg BID as tolerated.  Methotrexate and folate as well. Follow.   2. Chronic systolic CHF CHF team following.  Volume status stable at this time.  Will monitor closely for s/s of low output with recurrent VT.  CXR unremarkable Will continue current medications for now.   3. Rt groin wound Appreciate wound care and ID input.  Continue linezolid and wound care for now.   4. HTN Continue home meds  5. Mood Pt on Effexor ->holding for risk of serotonin syndrome on Linezolid.  Pt may have refractory headaches, and  if so, per ID can consider transition to Doxy to finish ABX course.  Stable. No change.   6. DM2 Sugars have been higher on steroids.  Continue metformin for now.  SSI and following.   For questions or updates, please contact [place] Please consult [email or web address] for contact info under Cardiology/STEMI.  Signed, Shirley Friar, PA-C  12/21/2019, 7:01 AM

## 2019-12-21 NOTE — Progress Notes (Signed)
Inpatient Diabetes Program Recommendations  AACE/ADA: New Consensus Statement on Inpatient Glycemic Control (2015)  Target Ranges:  Prepandial:   less than 140 mg/dL      Peak postprandial:   less than 180 mg/dL (1-2 hours)      Critically ill patients:  140 - 180 mg/dL   Lab Results  Component Value Date   GLUCAP 187 (H) 12/21/2019   HGBA1C 7.2 (H) 11/28/2019    Review of Glycemic Control Results for Lisa Poole, Lisa Poole (MRN [id number]) as of 12/21/2019 10:16  Ref. Range 12/20/2019 05:47 12/20/2019 12:04 12/20/2019 21:45 12/21/2019 06:40  Glucose-Capillary Latest Ref Range: 70 - 99 mg/dL 253 (H) 136 (H) 198 (H) 187 (H)   Diabetes history: DM 2 Outpatient Diabetes medications: Metformin 500 mg bid, Farxiga 10 mg Daily Current orders for Inpatient glycemic control:  Metformin 500 mg bid Farxiga 10 mg Daily Novolog 0-9 units tid + hs  Solumedrol 1000 mg Daily  Inpatient Diabetes Program Recommendations:    -  Consider increasing Novolog Correction to "moderate" 0-15 units tid + hs  Thanks,  Tama Headings RN, MSN, BC-ADM Inpatient Diabetes Coordinator Team Pager [phone] (8a-5p)

## 2019-12-21 NOTE — Telephone Encounter (Signed)
Request for PET imaging faxed to Ulice Dash at [facility].

## 2019-12-21 NOTE — Progress Notes (Addendum)
Patient ID: Lisa Poole, female   DOB: 1965/12/25, 54 y.o.   MRN: [id number]     Advanced Heart Failure Rounding Note  PCP-Cardiologist: No primary care provider on file.   Subjective:    Had brief NSVT overnight. Now on lidocaine 1 mg/min.  K 4.2 Mg 2.1   Now getting pulse dose Solu-Medrol + methotrexate   Rt IJ DVT discovered yesterday w/ failed attempt at Rt IJ central line placement. Central line placed in left IJ. Now on IV heparin.   Volume status ok. CVP 8-9  Continues on abx for Rt groin wound. AF. Blood cultures NGTD  OOB sitting up in chair. No complaints.    Objective:   Weight Range: 78.6 kg Body mass index is 33.84 kg/m.   Vital Signs:   Temp:  [97.5 F (36.4 C)-98.6 F (37 C)] 97.9 F (36.6 C) (08/12 0730) Pulse Rate:  [73-139] 81 (08/12 0800) Resp:  [12-27] 17 (08/12 0800) BP: (80-120)/(54-81) 108/70 (08/12 0800) SpO2:  [94 %-100 %] 96 % (08/12 0800) Weight:  [78.6 kg] 78.6 kg (08/12 0641) Last BM Date: 12/21/19  Weight change: Filed Weights   12/19/19 1035 12/20/19 0454 12/21/19 0641  Weight: 77.1 kg 77 kg 78.6 kg    Intake/Output:   Intake/Output Summary (Last 24 hours) at 12/21/2019 0833 Last data filed at 12/21/2019 0800 Gross per 24 hour  Intake 891.1 ml  Output 200 ml  Net 691.1 ml      Physical Exam    CVP 8 General:  Well appearing. Sitting up in chair No resp difficulty HEENT: Normal Neck: Supple. JVP ~8 cm. Carotids 2+ bilat; no bruits. No lymphadenopathy or thyromegaly appreciated. + Lf IJ CVC Cor: PMI nondisplaced. Tachy, regular rate & rhythm. No rubs, gallops or murmurs. Lungs: Clear Abdomen: Soft, nontender, nondistended. No hepatosplenomegaly. No bruits or masses. Good bowel sounds. Extremities: No cyanosis, clubbing, rash, edema Neuro: Alert & orientedx3, cranial nerves grossly intact. moves all 4 extremities w/o difficulty. Affect pleasant   Telemetry   A-V paced, 80s    Labs    CBC Recent Labs     12/20/19 0634 12/21/19 0421  WBC 5.2 14.2*  NEUTROABS 4.1  --   HGB 13.3 12.6  HCT 44.1 41.5  MCV 87.8 87.9  PLT 315 408   Basic Metabolic Panel Recent Labs    12/20/19 0634 12/21/19 0421  NA 139 138  K 4.5 4.2  CL 103 102  CO2 25 25  GLUCOSE 243* 209*  BUN 20 26*  CREATININE 0.89 1.04*  CALCIUM 9.2 9.1  MG 2.3 2.1   Liver Function Tests No results for input(s): AST, ALT, ALKPHOS, BILITOT, PROT, ALBUMIN in the last 72 hours. No results for input(s): LIPASE, AMYLASE in the last 72 hours. Cardiac Enzymes No results for input(s): CKTOTAL, CKMB, CKMBINDEX, TROPONINI in the last 72 hours.  BNP: BNP (last 3 results) Recent Labs    11/26/19 2011  BNP 1,258.1*    ProBNP (last 3 results) No results for input(s): PROBNP in the last 8760 hours.   D-Dimer No results for input(s): DDIMER in the last 72 hours. Hemoglobin A1C No results for input(s): HGBA1C in the last 72 hours. Fasting Lipid Panel No results for input(s): CHOL, HDL, LDLCALC, TRIG, CHOLHDL, LDLDIRECT in the last 72 hours. Thyroid Function Tests No results for input(s): TSH, T4TOTAL, T3FREE, THYROIDAB in the last 72 hours.  Invalid input(s): FREET3  Other results:   Imaging    DG CHEST PORT 1 VIEW  Result Date: 12/20/2019 CLINICAL DATA:  Central line placement EXAM: PORTABLE CHEST 1 VIEW COMPARISON:  12/19/2019 FINDINGS: Left jugular central venous catheter tip in the lower SVC. No pneumothorax. The catheter is not well seen due to overlying pacemaker leads. Heart size and vascularity within normal limits. Calcified AP window lymph nodes unchanged. Dual lead pacemaker/AICD unchanged in position. Negative for heart failure infiltrate or effusion. Chronic rotator cuff disease with narrowing of the acromial humeral distance. Widening the [facility] joint on the right may be due to trauma or prior surgical resection distal clavicle IMPRESSION: Central line in the SVC.  No pneumothorax. Lungs remain clear.  Electronically Signed   By: Franchot Gallo M.D.   On: 12/20/2019 17:13   VAS [place] UPPER EXTREMITY VENOUS DUPLEX  Result Date: 12/20/2019 UPPER VENOUS STUDY  Indications: Right IJV clot seen during sterile procedure Comparison Study: No prior study Performing Technologist: Maudry Mayhew MHA, RDMS, RVT, RDCS  Examination Guidelines: A complete evaluation includes B-mode imaging, spectral Doppler, color Doppler, and power Doppler as needed of all accessible portions of each vessel. Bilateral testing is considered an integral part of a complete examination. Limited examinations for reoccurring indications may be performed as noted.  Right Findings: +----------+------------+---------+-----------+--------------+----------------+ RIGHT     CompressiblePhasicitySpontaneous  Properties      Summary      +----------+------------+---------+-----------+--------------+----------------+ IJV           None                 No         softly          Age                                                    echogenic    Indeterminate   +----------+------------+---------+-----------+--------------+----------------+ Subclavian    Full       Yes       Yes                                   +----------+------------+---------+-----------+--------------+----------------+ Axillary      Full       Yes       Yes                                   +----------+------------+---------+-----------+--------------+----------------+ Brachial      Full       Yes       Yes                                   +----------+------------+---------+-----------+--------------+----------------+ Radial        Full                                                       +----------+------------+---------+-----------+--------------+----------------+ Ulnar         Full                                                       +----------+------------+---------+-----------+--------------+----------------+  Cephalic       Full                                                       +----------+------------+---------+-----------+--------------+----------------+ Basilic                                                  Not visualized  +----------+------------+---------+-----------+--------------+----------------+  Left Findings: +----------+------------+---------+-----------+----------+-------+ LEFT      CompressiblePhasicitySpontaneousPropertiesSummary +----------+------------+---------+-----------+----------+-------+ Subclavian               Yes       Yes                      +----------+------------+---------+-----------+----------+-------+  Summary:  Right: Findings consistent with age indeterminate deep vein thrombosis involving the right internal jugular vein.  Left: No evidence of thrombosis in the subclavian.  *See table(s) above for measurements and observations.  Diagnosing physician: Curt Jews MD Electronically signed by Curt Jews MD on 12/20/2019 at 10:24:50 PM.    Final      Medications:     Scheduled Medications: . aspirin EC  81 mg Oral Daily  . carvedilol  6.25 mg Oral BID WC  . Chlorhexidine Gluconate Cloth  6 each Topical Daily  . cholecalciferol  1,000 Units Oral Daily  . dapagliflozin propanediol  10 mg Oral Daily  . digoxin  0.125 mg Oral Daily  . folic acid  1 mg Oral Daily  . gabapentin  600 mg Oral BID  . insulin aspart  0-5 Units Subcutaneous QHS  . insulin aspart  0-9 Units Subcutaneous TID WC  . lidocaine  50 mg Intravenous Once  . linezolid  600 mg Oral BID  . loratadine  10 mg Oral Daily  . metFORMIN  500 mg Oral BID WC  . methotrexate  10 mg Oral Weekly  . multivitamin with minerals  1 tablet Oral Daily  . pantoprazole  80 mg Oral Daily  . pravastatin  10 mg Oral q1800  . sacubitril-valsartan  1 tablet Oral BID  . sodium chloride flush  10-40 mL Intracatheter Q12H  . sodium chloride flush  3 mL Intravenous Once  . sodium chloride flush  3 mL  Intravenous Q12H  . spironolactone  25 mg Oral Daily    Infusions: . sodium chloride    . heparin 900 Units/hr (12/21/19 0800)  . lidocaine 1 mg/min (12/21/19 0800)  . methylPREDNISolone (SOLU-MEDROL) injection Stopped (12/20/19 1159)    PRN Medications: sodium chloride, acetaminophen, albuterol, fentaNYL (SUBLIMAZE) injection, HYDROcodone-acetaminophen, ondansetron (ZOFRAN) IV, ondansetron, sodium chloride flush, sodium chloride flush   Assessment/Plan   1. Chronic systolic CHF: Nonischemic cardiomyopathy. Echo last admission in 7/21 with EF newly decreased to 20-25% with septal-lateral dyssynchrony and septal severe hypokinesis, normal RV, moderate TR, moderate MR. Echo in 5/20 prior to PPM placement showed EF 60-65%. Possible causes of cardiomyopathy included cardiac sarcoidosis, chronic RV pacing, frequent NSVT/PVCs, ?CAD (has RFs), ?viral myocarditis (relatively recent COVID-19 infection). No ETOH/drugs. Developed shock overnight 11/28/19 w/ hypotension and narrow pulse pressure, w/ subsequent AKI, SCr 1.18>>1.85>>2.26. Inotropes/pressors limited by ventricular ectopy.  LHC/RHC 7/23 with no significant CAD, elevated filling pressures, and low cardiac output  on norepinephrine. IABP placed. IABP removed 7/26. cMRI 7/27 showed patchy LGE in the basal septum, basal-mid inferoseptum/inferior wall, basal anterior wall, mid anterolateral wall. Cardiac MRI LGE pattern could be consistent with cardiac sarcoidosis (would explain earlier CHB). Alternatively, prior viral myocarditis (recent COVID-19). CT chest with ground glass lower lobes but no evidence for pulmonary sarcoidosis. She is now s/p device upgrade to MDT CRT-D. This admission, she is not volume overloaded on exam and has NYHA class II symptoms.  I think that her HF is fairly well-compensated, recurrent ventricular arrhythmias appears to be the main issue.  -She does not need a loop diuretic for now.  - Continue digoxin 0.125 daily.  Dig level ok - Continue spironolactone 25 mg daily  - Continue Entresto 49/51 mg bid - Continue Coreg 6.25 mg bid.  If she has BP room, would increase given VT (SBP 100s currently, will not change).  -Continuedapagliflozin 10 mg daily.  - Continue pulse Solumedrol 1 g IV daily x 5 days for treatment of cardiac sarcoidosis.  - Will need [facility] ASAPfor cardiac sarcoidosis to confirm diagnosis.    - Cardiomyopathy likely worsened by frequent VT/PVCs. Now on IV lidocaine to suppress.  2. Recurrent ventricular arrhythmias: She has had VT with multiple morphologies. Increased ventricular arrhythmias over 6 months. ?Underlying cause. No significant coronary disease. Concerned that she has cardiac sarcoidosis (would also explain CHB). Amiodarone stopped in the past due to torsades and very long QTc on amiodarone.  Now on lidocaine. Recurrent ventricular arrhythmias since decreasing prednisone makes me worry that she does indeed have cardiac sarcoidosis.  - Continue llidocaine 1 mg gtt.  - Titrate up Coreg if able.  - Keep K > 4.0 Mg > 2.0 - Transitioned to IV Solumedrol as above.  - now on methotrexate  - She is now s/p ICD upgrade.  3. H/o CHB: Cause is uncertain. Cath in 5/20 prior to PPM showed no significant disease. Possible cardiac sarcoidosis by cMRI but no evidence for pulmonary sarcoidosis. Needs cardiac PET scan for further evaluation for sarcoidosis.  4. Right Groin Hematoma => wound infection: Hematoma developed 7/26 at IABP site after removal. She had a Femstop.  Hematoma resolved, but recently noted to have oozing at wound site.  MRSA grew from culture and linezolid was started.  CT abdomen/pelvis showed no abscess.  - blood cultures NGTD  - Continue linezolid.  5. Rt IJ DVT - continue IV heparin   Length of Stay: 2  Lyda Jester, PA-C  12/21/2019, 8:33 AM  Advanced Heart Failure Team Pager [phone] (M-F; 7a - 4p)  Please contact [facility]  for night-coverage after hours (4p -7a ) and weekends on [email or web address]  Patient seen with PA, agree with the above note.   Stable today, less ectopy overnight with lidocaine gtt + pulse Solumedrol.  Lidocaine level 1.2.  No dyspnea.   General: NAD Neck: No JVD, no thyromegaly or thyroid nodule.  Lungs: Clear to auscultation bilaterally with normal respiratory effort. CV: Nondisplaced PMI.  Heart regular S1/S2, no S3/S4, no murmur.  No peripheral edema.  No carotid bruit.   Abdomen: Soft, nontender, no hepatosplenomegaly, no distention.  Skin: Intact without lesions or rashes.  Neurologic: Alert and oriented x 3.  Psych: Normal affect. Extremities: No clubbing or cyanosis.  HEENT: Normal.   Continue lidocaine and Solumedrol for now. MTX started yesterday by Dr. Caryl Comes for treatment of presumptive cardiac sarcoidosis.  Definitive diagnosis is needed, will need cardiac PET (will  work on getting at [facility]).   She has right IJ DVT, likely from prior central line.   - Will need heparin for 3 months . - Currently has left IJ line but will replace with right PICC to avoid fresh pacemaker wires.   Follow CVP off line.  Continue current HF meds, no BP room to titrate.   Loralie Champagne 12/21/2019 12:40 PM

## 2019-12-21 NOTE — Progress Notes (Signed)
[place] for Heparin  Indication: DVT  Allergies  Allergen Reactions  . Lisinopril Other (See Comments) and Cough    Flu like symptoms, also   . Ace Inhibitors Other (See Comments)    Flu-like symptoms  . Augmentin [Amoxicillin-Pot Clavulanate] Nausea And Vomiting  . Benazepril Other (See Comments)    Flu like symptoms  . Oxycodone-Acetaminophen Nausea And Vomiting  . Sulfa Antibiotics Nausea And Vomiting    Patient Measurements: Height: 5' (152.4 cm) Weight: 77 kg (169 lb 11.2 oz) IBW/kg (Calculated) : 45.5 Heparin Dosing Weight: 62.9 kg   Vital Signs: Temp: 98.6 F (37 C) (08/12 0400) Temp Source: Oral (08/12 0400) BP: 89/55 (08/12 0500) Pulse Rate: 82 (08/12 0500)  Labs: Recent Labs    12/19/19 1135 12/19/19 1135 12/20/19 0634 12/21/19 0421  HGB 13.7   < > 13.3 12.6  HCT 45.7  --  44.1 41.5  PLT 313  --  315 289  HEPARINUNFRC  --   --   --  1.00*  CREATININE 0.85  --  0.89 1.04*   < > = values in this interval not displayed.    Estimated Creatinine Clearance: 56.1 mL/min (A) (by C-G formula based on SCr of 1.04 mg/dL (H)).   Medical History: Past Medical History:  Diagnosis Date  . Aortic insufficiency   . Asthma 09/13/2006  . Complete heart block ([place]) 02/11/2019  . Cystocele without uterine prolapse 12/12/2018  . Diabetes mellitus without complication ([place])   . Elevated troponin level   . Hypercholesteremia   . Hypertension   . Primary osteoarthritis of left knee 12/10/2017    Medications:  Medications Prior to Admission  Medication Sig Dispense Refill Last Dose  . albuterol (PROVENTIL HFA;VENTOLIN HFA) 108 (90 Base) MCG/ACT inhaler Inhale 1-2 puffs into the lungs every 6 (six) hours as needed for wheezing or shortness of breath. 1 Inhaler 0 Past Month at Unknown time  . aspirin EC 81 MG tablet Take 81 mg by mouth daily. Swallow whole.   12/18/2019 at Unknown time  . Biotin 10 MG CAPS Take 10 mg by mouth daily.    12/18/2019 at Unknown time  . carvedilol (COREG) 6.25 MG tablet Take 1 tablet (6.25 mg total) by mouth 2 (two) times daily with a meal. 60 tablet 1 12/18/2019 at 0800  . cetirizine (ZYRTEC) 10 MG tablet Take 10 mg by mouth daily.   unk  . Cholecalciferol (VITAMIN D-3) 25 MCG (1000 UT) CAPS Take 1,000 Units by mouth daily.    Past Month at Unknown time  . dapagliflozin propanediol (FARXIGA) 10 MG TABS tablet Take 1 tablet (10 mg total) by mouth daily. 30 tablet 6 12/18/2019 at Unknown time  . digoxin (LANOXIN) 0.125 MG tablet Take 1 tablet (0.125 mg total) by mouth daily. 30 tablet 6 12/18/2019 at Unknown time  . gabapentin (NEURONTIN) 600 MG tablet Take 600 mg by mouth 2 (two) times daily.   12/18/2019 at Unknown time  . HYDROcodone-acetaminophen (NORCO) 10-325 MG tablet Take 1 tablet by mouth 4 (four) times daily.    12/18/2019 at Unknown time  . linezolid (ZYVOX) 600 MG tablet Take 1 tablet (600 mg total) by mouth 2 (two) times daily for 10 days. 20 tablet 0 12/18/2019 at Unknown time  . meloxicam (MOBIC) 15 MG tablet Take 15 mg by mouth daily.   12/18/2019 at Unknown time  . metFORMIN (GLUCOPHAGE) 500 MG tablet Take 500 mg by mouth 2 (two) times daily.  12/18/2019 at Unknown time  . mexiletine (MEXITIL) 150 MG capsule Take 1 capsule (150 mg total) by mouth every 12 (twelve) hours. 60 capsule 6 12/18/2019 at Unknown time  . Multiple Vitamins-Minerals (CENTRUM SILVER 50+WOMEN) TABS Take 1 tablet by mouth daily.   12/18/2019 at Unknown time  . naloxone (NARCAN) 4 MG/0.1ML LIQD nasal spray kit Place 1 spray into the nose as directed.   unk  . omeprazole (PRILOSEC) 40 MG capsule Take 40 mg by mouth daily.   12/18/2019 at Unknown time  . ondansetron (ZOFRAN ODT) 4 MG disintegrating tablet Take 1 tablet (4 mg total) by mouth every 8 (eight) hours as needed for nausea or vomiting. 20 tablet 0 unk  . pravastatin (PRAVACHOL) 10 MG tablet Take 1 tablet (10 mg total) by mouth daily at 6 PM. 30 tablet 5 12/18/2019 at Unknown time  .  predniSONE (DELTASONE) 10 MG tablet Take 10 mg by mouth daily with breakfast.   12/18/2019 at Unknown time  . sacubitril-valsartan (ENTRESTO) 49-51 MG Take 1 tablet by mouth 2 (two) times daily. 60 tablet 6 12/18/2019 at Unknown time  . spironolactone (ALDACTONE) 25 MG tablet Take 1 tablet (25 mg total) by mouth daily. 30 tablet 6 12/18/2019 at Unknown time  . venlafaxine XR (EFFEXOR XR) 150 MG 24 hr capsule Take 1 capsule (150 mg total) by mouth daily with breakfast. 90 capsule 3 Past Week at Unknown time  . ACCU-CHEK AVIVA PLUS test strip 3 (three) times daily.     . predniSONE (DELTASONE) 20 MG tablet Take 0.5 tablets (10 mg total) by mouth daily with breakfast. (Patient not taking: Reported on 12/19/2019) 30 tablet 1 Not Taking at Unknown time    Assessment: 89 YOF with new RIJ DVT to start IV heparin. Of note, patient is currently SQ Lovenox 40 mg daily. Last dose was today at 1416. SCr wnl. H/H and Plt wnl   8/12 AM update:  Heparin level elevated Heparin running peripherally and lab drawn from central line  Goal of Therapy:  Heparin level 0.3-0.7 units/ml Monitor platelets by anticoagulation protocol: Yes   Plan:  -Dec heparin to 900 units/hr -1400 heparin level  Narda Bonds, PharmD, BCPS Clinical Pharmacist Phone: [phone]

## 2019-12-21 NOTE — Plan of Care (Signed)

## 2019-12-22 DIAGNOSIS — I5022 Chronic systolic (congestive) heart failure: Secondary | ICD-10-CM | POA: Diagnosis not present

## 2019-12-22 LAB — BASIC METABOLIC PANEL
Anion gap: 8 (ref 5–15)
Anion gap: 10 (ref 5–15)
BUN: 35 mg/dL — ABNORMAL HIGH (ref 6–20)
BUN: 30 mg/dL — ABNORMAL HIGH (ref 6–20)
CO2: 23 mmol/L (ref 22–32)
CO2: 23 mmol/L (ref 22–32)
Calcium: 9 mg/dL (ref 8.9–10.3)
Calcium: 9.3 mg/dL (ref 8.9–10.3)
Chloride: 104 mmol/L (ref 98–111)
Chloride: 104 mmol/L (ref 98–111)
Creatinine, Ser: 0.88 mg/dL (ref 0.44–1.00)
Creatinine, Ser: 1.08 mg/dL — ABNORMAL HIGH (ref 0.44–1.00)
GFR calc Af Amer: >60 mL/min (ref >60)
GFR calc Af Amer: >60 mL/min (ref >60)
GFR calc non Af Amer: >60 mL/min (ref >60)
GFR calc non Af Amer: 58 mL/min — ABNORMAL LOW (ref >60)
Glucose, Bld: 193 mg/dL — ABNORMAL HIGH (ref 70–99)
Glucose, Bld: 290 mg/dL — ABNORMAL HIGH (ref 70–99)
Potassium: 3.9 mmol/L (ref 3.5–5.1)
Potassium: 4.5 mmol/L (ref 3.5–5.1)
Sodium: 135 mmol/L (ref 135–145)
Sodium: 137 mmol/L (ref 135–145)

## 2019-12-22 LAB — GLUCOSE, CAPILLARY
Glucose-Capillary: 160 mg/dL — ABNORMAL HIGH (ref 70–99)
Glucose-Capillary: 136 mg/dL — ABNORMAL HIGH (ref 70–99)
Glucose-Capillary: 178 mg/dL — ABNORMAL HIGH (ref 70–99)
Glucose-Capillary: 259 mg/dL — ABNORMAL HIGH (ref 70–99)

## 2019-12-22 LAB — CBC
HCT: 43.2 % (ref 36.0–46.0)
Hemoglobin: 13.4 g/dL (ref 12.0–15.0)
MCH: 27.4 pg (ref 26.0–34.0)
MCHC: 31 g/dL (ref 30.0–36.0)
MCV: 88.3 fL (ref 80.0–100.0)
Platelets: 328 10*3/uL (ref 150–400)
RBC: 4.89 MIL/uL (ref 3.87–5.11)
RDW: 17 % — ABNORMAL HIGH (ref 11.5–15.5)
WBC: 19.7 10*3/uL — ABNORMAL HIGH (ref 4.0–10.5)
nRBC: 0 % (ref 0.0–0.2)

## 2019-12-22 LAB — MAGNESIUM
Magnesium: 2.2 mg/dL (ref 1.7–2.4)
Magnesium: 2.1 mg/dL (ref 1.7–2.4)

## 2019-12-22 LAB — HEPARIN LEVEL (UNFRACTIONATED)
Heparin Unfractionated: 0.71 IU/mL — ABNORMAL HIGH (ref 0.30–0.70)
Heparin Unfractionated: 0.79 IU/mL — ABNORMAL HIGH (ref 0.30–0.70)

## 2019-12-22 LAB — LIDOCAINE LEVEL: Lidocaine Lvl: 2.4 ug/mL (ref 1.5–5.0)

## 2019-12-22 MED ORDER — APIXABAN 5 MG PO TABS
10.0000 mg | ORAL_TABLET | Freq: Two times a day (BID) | ORAL | Status: DC
  Administered 2019-12-22 – 2019-12-27 (×11): 10 mg via ORAL
  Filled 2019-12-22 (×11): qty 2

## 2019-12-22 MED ORDER — MEXILETINE HCL 150 MG PO CAPS
150.0000 mg | ORAL_CAPSULE | Freq: Two times a day (BID) | ORAL | Status: DC
  Filled 2019-12-22: qty 1

## 2019-12-22 MED ORDER — APIXABAN 5 MG PO TABS: 5.0000 mg | ORAL_TABLET | Freq: Two times a day (BID) | ORAL | Status: DC

## 2019-12-22 MED ORDER — PROPOFOL 1000 MG/100ML IV EMUL
INTRAVENOUS | Status: AC
  Filled 2019-12-22: qty 100

## 2019-12-22 MED ORDER — POTASSIUM CHLORIDE CRYS ER 20 MEQ PO TBCR
20.0000 meq | EXTENDED_RELEASE_TABLET | Freq: Once | ORAL | Status: AC
  Administered 2019-12-22: 20 meq via ORAL
  Filled 2019-12-22: qty 1

## 2019-12-22 MED ORDER — MEXILETINE HCL 200 MG PO CAPS
200.0000 mg | ORAL_CAPSULE | Freq: Two times a day (BID) | ORAL | Status: DC
  Administered 2019-12-22 – 2019-12-24 (×5): 200 mg via ORAL
  Filled 2019-12-22 (×6): qty 1

## 2019-12-22 NOTE — Progress Notes (Signed)
Notified by nurse for run of VT. Patient appears to have had about 40 beats VT on telemetry followed by brief ATP (12 beats). Patient denied ICD shock or acute symptoms. Hemodynamically stable otherwise. D/w Dr. Harl Bowie and reviewed telemetry - he advises to give 10p mexiletine dose now. Will check stat BMET/Mg with request to call results to [place]. Nurse aware, continue to follow on telemetry for recurrent episodes. Vidit Boissonneault PA-C

## 2019-12-22 NOTE — Progress Notes (Signed)
ANTICOAGULATION CONSULT NOTE - Follow Up Consult  Pharmacy Consult for heparin Indication: DVT   Labs: Recent Labs    12/19/19 1135 12/19/19 1135 12/20/19 0634 12/21/19 0421 12/21/19 1400 12/22/19 0016  HGB 13.7   < > 13.3 12.6  --   --   HCT 45.7  --  44.1 41.5  --   --   PLT 313  --  315 289  --   --   HEPARINUNFRC  --   --   --  1.00* 1.06* 0.79*  CREATININE 0.85  --  0.89 1.04*  --   --    < > = values in this interval not displayed.    Assessment: 54yo female remains supratherapeutic on heparin though closer to goal after rate change.  Goal of Therapy:  Heparin level 0.3-0.7 units/ml   Plan:  Will decrease heparin gtt by ~1-2 units/kg/hr to 700 units/hr and check level in 6-8 hours.    Wynona Neat, PharmD, BCPS  12/22/2019,1:02 AM

## 2019-12-22 NOTE — Progress Notes (Signed)
Patient had run of VT. BP 127/81. Patient asymptomatic.  On call PA Dunn notified. Strips reviewed by Dunn. Orders to give 2200 dose of mexiletine early.  Labs ordered and orders to notify cardiologist when labs resulted. Passed on to night RN.  Mexiletine not available, pharmacy called and asked to tube medication.

## 2019-12-22 NOTE — Progress Notes (Signed)
[facility] for Heparin>Eliquis Indication: DVT  Allergies  Allergen Reactions  . Lisinopril Other (See Comments) and Cough    Flu like symptoms, also   . Ace Inhibitors Other (See Comments)    Flu-like symptoms  . Augmentin [Amoxicillin-Pot Clavulanate] Nausea And Vomiting  . Benazepril Other (See Comments)    Flu like symptoms  . Oxycodone-Acetaminophen Nausea And Vomiting  . Sulfa Antibiotics Nausea And Vomiting    Patient Measurements: Height: 5' (152.4 cm) Weight: 79.4 kg (175 lb) IBW/kg (Calculated) : 45.5 Heparin Dosing Weight: 62.9 kg   Vital Signs: Temp: 97.7 F (36.5 C) (08/13 0700) Temp Source: Oral (08/13 0700) BP: 95/60 (08/13 0600) Pulse Rate: 79 (08/13 0856)  Labs: Recent Labs    12/20/19 0634 12/20/19 0634 12/21/19 0421 12/21/19 0421 12/21/19 1400 12/22/19 0016 12/22/19 0424 12/22/19 0430  HGB 13.3   < > 12.6  --   --   --  13.4  --   HCT 44.1  --  41.5  --   --   --  43.2  --   PLT 315  --  289  --   --   --  328  --   HEPARINUNFRC  --   --  1.00*   < > 1.06* 0.79*  --  0.71*  CREATININE 0.89  --  1.04*  --   --   --  0.88  --    < > = values in this interval not displayed.    Estimated Creatinine Clearance: 67.4 mL/min (by C-G formula based on SCr of 0.88 mg/dL).   Medical History: Past Medical History:  Diagnosis Date  . Aortic insufficiency   . Asthma 09/13/2006  . Complete heart block ([place]) 02/11/2019  . Cystocele without uterine prolapse 12/12/2018  . Diabetes mellitus without complication ([place])   . Elevated troponin level   . Hypercholesteremia   . Hypertension   . Primary osteoarthritis of left knee 12/10/2017    Medications:  Medications Prior to Admission  Medication Sig Dispense Refill Last Dose  . albuterol (PROVENTIL HFA;VENTOLIN HFA) 108 (90 Base) MCG/ACT inhaler Inhale 1-2 puffs into the lungs every 6 (six) hours as needed for wheezing or shortness of breath. 1 Inhaler 0 Past Month at  Unknown time  . aspirin EC 81 MG tablet Take 81 mg by mouth daily. Swallow whole.   12/18/2019 at Unknown time  . Biotin 10 MG CAPS Take 10 mg by mouth daily.   12/18/2019 at Unknown time  . carvedilol (COREG) 6.25 MG tablet Take 1 tablet (6.25 mg total) by mouth 2 (two) times daily with a meal. 60 tablet 1 12/18/2019 at 0800  . cetirizine (ZYRTEC) 10 MG tablet Take 10 mg by mouth daily.   unk  . Cholecalciferol (VITAMIN D-3) 25 MCG (1000 UT) CAPS Take 1,000 Units by mouth daily.    Past Month at Unknown time  . dapagliflozin propanediol (FARXIGA) 10 MG TABS tablet Take 1 tablet (10 mg total) by mouth daily. 30 tablet 6 12/18/2019 at Unknown time  . digoxin (LANOXIN) 0.125 MG tablet Take 1 tablet (0.125 mg total) by mouth daily. 30 tablet 6 12/18/2019 at Unknown time  . gabapentin (NEURONTIN) 600 MG tablet Take 600 mg by mouth 2 (two) times daily.   12/18/2019 at Unknown time  . HYDROcodone-acetaminophen (NORCO) 10-325 MG tablet Take 1 tablet by mouth 4 (four) times daily.    12/18/2019 at Unknown time  . linezolid (ZYVOX) 600 MG tablet  Take 1 tablet (600 mg total) by mouth 2 (two) times daily for 10 days. 20 tablet 0 12/18/2019 at Unknown time  . meloxicam (MOBIC) 15 MG tablet Take 15 mg by mouth daily.   12/18/2019 at Unknown time  . metFORMIN (GLUCOPHAGE) 500 MG tablet Take 500 mg by mouth 2 (two) times daily.   12/18/2019 at Unknown time  . mexiletine (MEXITIL) 150 MG capsule Take 1 capsule (150 mg total) by mouth every 12 (twelve) hours. 60 capsule 6 12/18/2019 at Unknown time  . Multiple Vitamins-Minerals (CENTRUM SILVER 50+WOMEN) TABS Take 1 tablet by mouth daily.   12/18/2019 at Unknown time  . naloxone (NARCAN) 4 MG/0.1ML LIQD nasal spray kit Place 1 spray into the nose as directed.   unk  . omeprazole (PRILOSEC) 40 MG capsule Take 40 mg by mouth daily.   12/18/2019 at Unknown time  . ondansetron (ZOFRAN ODT) 4 MG disintegrating tablet Take 1 tablet (4 mg total) by mouth every 8 (eight) hours as needed for nausea or  vomiting. 20 tablet 0 unk  . pravastatin (PRAVACHOL) 10 MG tablet Take 1 tablet (10 mg total) by mouth daily at 6 PM. 30 tablet 5 12/18/2019 at Unknown time  . predniSONE (DELTASONE) 10 MG tablet Take 10 mg by mouth daily with breakfast.   12/18/2019 at Unknown time  . sacubitril-valsartan (ENTRESTO) 49-51 MG Take 1 tablet by mouth 2 (two) times daily. 60 tablet 6 12/18/2019 at Unknown time  . spironolactone (ALDACTONE) 25 MG tablet Take 1 tablet (25 mg total) by mouth daily. 30 tablet 6 12/18/2019 at Unknown time  . venlafaxine XR (EFFEXOR XR) 150 MG 24 hr capsule Take 1 capsule (150 mg total) by mouth daily with breakfast. 90 capsule 3 Past Week at Unknown time  . ACCU-CHEK AVIVA PLUS test strip 3 (three) times daily.     . predniSONE (DELTASONE) 20 MG tablet Take 0.5 tablets (10 mg total) by mouth daily with breakfast. (Patient not taking: Reported on 12/19/2019) 30 tablet 1 Not Taking at Unknown time    Assessment: 3 YOF with new RIJ DVT on IV heparin. Pharmacy has been consulted to transition to apixaban.   Goal of Therapy:  Heparin level 0.3-0.7 units/ml Monitor platelets by anticoagulation protocol: Yes   Plan:  -Stop heparin  -Start apixaban 10 mg BID x 7 days, then 5 mg BID  -Follow up case management consult for copy information (not eligible for copay card with Medicare)  Vertis Kelch, PharmD, BCPS Phone [phone] 12/22/2019       9:03 AM  Please check [email or web address] for unit-specific pharmacist phone numbers

## 2019-12-22 NOTE — Progress Notes (Signed)
Electrophysiology Rounding Note  Patient Name: Lisa Poole Date of Encounter: 12/22/2019  Primary Cardiologist: Dr. Aundra Dubin  Electrophysiologist: Dr. Lovena Le  Subjective   The patient is doing well today.  At this time, the patient denies chest pain, shortness of breath, or any new concerns.  Inpatient Medications    Scheduled Meds: . aspirin EC  81 mg Oral Daily  . carvedilol  6.25 mg Oral BID WC  . Chlorhexidine Gluconate Cloth  6 each Topical Daily  . cholecalciferol  1,000 Units Oral Daily  . dapagliflozin propanediol  10 mg Oral Daily  . digoxin  0.125 mg Oral Daily  . folic acid  1 mg Oral Daily  . gabapentin  600 mg Oral BID  . insulin aspart  0-15 Units Subcutaneous TID WC  . insulin aspart  0-5 Units Subcutaneous QHS  . lidocaine  50 mg Intravenous Once  . linezolid  600 mg Oral BID  . loratadine  10 mg Oral Daily  . metFORMIN  500 mg Oral BID WC  . methotrexate  10 mg Oral Weekly  . multivitamin with minerals  1 tablet Oral Daily  . pantoprazole  80 mg Oral Daily  . potassium chloride  20 mEq Oral Once  . pravastatin  10 mg Oral q1800  . sacubitril-valsartan  1 tablet Oral BID  . sodium chloride flush  10-40 mL Intracatheter Q12H  . sodium chloride flush  3 mL Intravenous Once  . sodium chloride flush  3 mL Intravenous Q12H  . spironolactone  25 mg Oral Daily   Continuous Infusions: . sodium chloride    . heparin 700 Units/hr (12/22/19 0600)  . lidocaine 1 mg/min (12/22/19 0600)  . methylPREDNISolone (SOLU-MEDROL) injection Stopped (12/21/19 1117)   PRN Meds: sodium chloride, acetaminophen, albuterol, fentaNYL (SUBLIMAZE) injection, HYDROcodone-acetaminophen, ondansetron (ZOFRAN) IV, ondansetron, sodium chloride flush, sodium chloride flush   Vital Signs    Vitals:   12/22/19 0300 12/22/19 0400 12/22/19 0500 12/22/19 0600  BP: 110/63 117/68 102/71 95/60  Pulse: 85 73 78 77  Resp: 17 18 (!) 21 (!) 22  Temp:  98.1 F (36.7 C)    TempSrc:  Oral      SpO2: 95% 98% 97% 95%  Weight:  79.4 kg    Height:        Intake/Output Summary (Last 24 hours) at 12/22/2019 0733 Last data filed at 12/22/2019 0600 Gross per 24 hour  Intake 662.24 ml  Output --  Net 662.24 ml   Filed Weights   12/20/19 0454 12/21/19 0641 12/22/19 0400  Weight: 77 kg 78.6 kg 79.4 kg    Physical Exam    GEN- The patient is well appearing, alert and oriented x 3 today.   Head- normocephalic, atraumatic Eyes-  Sclera clear, conjunctiva pink Ears- hearing intact Oropharynx- clear Neck- supple; LIJ central line Lungs- Clear to ausculation bilaterally, normal work of breathing Heart- Regular rate and rhythm, no murmurs, rubs or gallops GI- soft, NT, ND, + BS Extremities- no clubbing or cyanosis. No edema Skin- no rash or lesion Psych- euthymic mood, full affect Neuro- strength and sensation are intact  Labs    CBC Recent Labs    12/20/19 0634 12/20/19 0634 12/21/19 0421 12/22/19 0424  WBC 5.2   < > 14.2* 19.7*  NEUTROABS 4.1  --   --   --   HGB 13.3   < > 12.6 13.4  HCT 44.1   < > 41.5 43.2  MCV 87.8   < >  87.9 88.3  PLT 315   < > 289 328   < > = values in this interval not displayed.   Basic Metabolic Panel Recent Labs    12/21/19 0421 12/22/19 0424  NA 138 135  K 4.2 3.9  CL 102 104  CO2 25 23  GLUCOSE 209* 193*  BUN 26* 35*  CREATININE 1.04* 0.88  CALCIUM 9.1 9.0  MG 2.1 2.2   Liver Function Tests No results for input(s): AST, ALT, ALKPHOS, BILITOT, PROT, ALBUMIN in the last 72 hours. No results for input(s): LIPASE, AMYLASE in the last 72 hours. Cardiac Enzymes No results for input(s): CKTOTAL, CKMB, CKMBINDEX, TROPONINI in the last 72 hours.   Telemetry    NSR 70-80s occasional PVCs (personally reviewed)  Radiology    DG CHEST PORT 1 VIEW  Result Date: 12/20/2019 CLINICAL DATA:  Central line placement EXAM: PORTABLE CHEST 1 VIEW COMPARISON:  12/19/2019 FINDINGS: Left jugular central venous catheter tip in the lower SVC.  No pneumothorax. The catheter is not well seen due to overlying pacemaker leads. Heart size and vascularity within normal limits. Calcified AP window lymph nodes unchanged. Dual lead pacemaker/AICD unchanged in position. Negative for heart failure infiltrate or effusion. Chronic rotator cuff disease with narrowing of the acromial humeral distance. Widening the [facility] joint on the right may be due to trauma or prior surgical resection distal clavicle IMPRESSION: Central line in the SVC.  No pneumothorax. Lungs remain clear. Electronically Signed   By: Franchot Gallo M.D.   On: 12/20/2019 17:13   VAS [place] UPPER EXTREMITY VENOUS DUPLEX  Result Date: 12/20/2019 UPPER VENOUS STUDY  Indications: Right IJV clot seen during sterile procedure Comparison Study: No prior study Performing Technologist: Maudry Mayhew MHA, RDMS, RVT, RDCS  Examination Guidelines: A complete evaluation includes B-mode imaging, spectral Doppler, color Doppler, and power Doppler as needed of all accessible portions of each vessel. Bilateral testing is considered an integral part of a complete examination. Limited examinations for reoccurring indications may be performed as noted.  Right Findings: +----------+------------+---------+-----------+--------------+----------------+ RIGHT     CompressiblePhasicitySpontaneous  Properties      Summary      +----------+------------+---------+-----------+--------------+----------------+ IJV           None                 No         softly          Age                                                    echogenic    Indeterminate   +----------+------------+---------+-----------+--------------+----------------+ Subclavian    Full       Yes       Yes                                   +----------+------------+---------+-----------+--------------+----------------+ Axillary      Full       Yes       Yes                                    +----------+------------+---------+-----------+--------------+----------------+ Brachial      Full  Yes       Yes                                   +----------+------------+---------+-----------+--------------+----------------+ Radial        Full                                                       +----------+------------+---------+-----------+--------------+----------------+ Ulnar         Full                                                       +----------+------------+---------+-----------+--------------+----------------+ Cephalic      Full                                                       +----------+------------+---------+-----------+--------------+----------------+ Basilic                                                  Not visualized  +----------+------------+---------+-----------+--------------+----------------+  Left Findings: +----------+------------+---------+-----------+----------+-------+ LEFT      CompressiblePhasicitySpontaneousPropertiesSummary +----------+------------+---------+-----------+----------+-------+ Subclavian               Yes       Yes                      +----------+------------+---------+-----------+----------+-------+  Summary:  Right: Findings consistent with age indeterminate deep vein thrombosis involving the right internal jugular vein.  Left: No evidence of thrombosis in the subclavian.  *See table(s) above for measurements and observations.  Diagnosing physician: Curt Jews MD Electronically signed by Curt Jews MD on 12/20/2019 at 10:24:50 PM.    Final     Patient Profile     Ms. Mandelbaum is a 54 year old woman with a history of ventricular tachycardia, chronic systolic congestive heart failure (ejection fraction 30%), diabetes, hypertension who presents from clinic with an increased ventricular tachycardia burden on her implanted biventricular ICD. Her ventricular tachycardia episodes have increased in  frequency and required multiple ATP therapies. The patient does recall episodes of ventricular tachycardia and describes a rapid pounding in her chest.  Admitted for VT storm by device interrogation. Symptomatic withlightheadednessanddizziness  Assessment & Plan    1.VT storm, multiple foci Quiescent on IV steroids and IV lidocaine Continue lidocaine gtt as well. Timing of transition to mexiletine per Dr. Quentin Ore and Dr. Caryl Comes.  This will be day 3 of 5 of planned 5 doses of IV solumedrol.  With multiple foci (at least 3 noted on device interrogation) and infection, poor candidate for ablation.  Continue coreg 6.25 mg BID as tolerated.  Methotrexate and folate as well. Follow  2. Chronic systolic CHF CHF team following.  Volume status stable and NYHA function stable.  Will monitor closely for s/s of low output with recurrent VT. Will continue current  medications fornow.  3. Rt groin wound Appreciate wound care and ID input.  Continue linezolid to finish course of 7-10 days (would finish mid next week).   4. HTN Continue home meds  5. Mood Pt on Effexor ->holding for risk of serotonin syndrome on Linezolid.  Pt may have refractory headaches, and if so, per ID can consider transition to Doxy to finish ABX course. Stable. No change.   6. DM2 Sugars have been higher on steroids.  Continue metformin and Wilder Glade for now. SSI and following.  NOTE: PET scan CANNOT be performed on insulin, so would allow transient hyperglycemia as an outpt in order to not place on home insulin (if needed)  For questions or updates, please contact [place] Please consult [email or web address] for contact info under Cardiology/STEMI.  Signed, Shirley Friar, PA-C  12/22/2019, 7:33 AM

## 2019-12-22 NOTE — Progress Notes (Signed)
  Spoke with Dr. Caryl Comes and pharmacy.   Will transition from lidocaine to mexitil today with quiescent tele. Will discontinue CVL and transition from heparin to Eliquis for RIJ DVT.   Will continue pulse steroids as planned for total of 5 days (last dose Sunday Morning), prior to transitioning to po steroids.   Legrand Como [address] [place], PA-C  12/22/2019 12:02 PM

## 2019-12-22 NOTE — Progress Notes (Addendum)
Patient ID: Lisa Poole, female   DOB: 10/11/1965, 54 y.o.   MRN: [id number]     Advanced Heart Failure Rounding Note  PCP-Cardiologist: No primary care provider on file.   Subjective:    No events overnight.    Stable on lidocaine. No further VT.  K 3.9 Mg 2.2 Lidocaine level pending   Volume status ok. CVP 8. No dyspnea.  Continues on abx for MRSA wound infection. Remains AF. WBC 19K but on high dose steroids for presumed sarcoid. Blood cultures NGTD.   Objective:   Weight Range: 79.4 kg Body mass index is 34.18 kg/m.   Vital Signs:   Temp:  [97.3 F (36.3 C)-98.1 F (36.7 C)] 98.1 F (36.7 C) (08/13 0400) Pulse Rate:  [61-85] 77 (08/13 0600) Resp:  [14-32] 22 (08/13 0600) BP: (80-126)/(53-88) 95/60 (08/13 0600) SpO2:  [94 %-100 %] 95 % (08/13 0600) Weight:  [79.4 kg] 79.4 kg (08/13 0400) Last BM Date: 12/22/19  Weight change: Filed Weights   12/20/19 0454 12/21/19 0641 12/22/19 0400  Weight: 77 kg 78.6 kg 79.4 kg    Intake/Output:   Intake/Output Summary (Last 24 hours) at 12/22/2019 0739 Last data filed at 12/22/2019 0600 Gross per 24 hour  Intake 662.24 ml  Output --  Net 662.24 ml      Physical Exam    CVP 8 General:  Well appearing.  No resp difficulty HEENT: Normal Neck: Supple. JVP ~8 cm. Carotids 2+ bilat; no bruits. No lymphadenopathy or thyromegaly appreciated. + Lf IJ CVC Cor: PMI nondisplaced, RRR No rubs, gallops or murmurs. Lungs: Clear, no wheezing  Abdomen: Soft, nontender, nondistended. No hepatosplenomegaly. No bruits or masses. Good bowel sounds. Extremities: No cyanosis, clubbing, rash, edema Neuro: Alert & orientedx3, cranial nerves grossly intact. moves all 4 extremities w/o difficulty. Affect pleasant   Telemetry   V-paced. 80s. Occasional PVCs but no further VT    Labs    CBC Recent Labs    12/20/19 0634 12/20/19 0634 12/21/19 0421 12/22/19 0424  WBC 5.2   < > 14.2* 19.7*  NEUTROABS 4.1  --   --   --   HGB  13.3   < > 12.6 13.4  HCT 44.1   < > 41.5 43.2  MCV 87.8   < > 87.9 88.3  PLT 315   < > 289 328   < > = values in this interval not displayed.   Basic Metabolic Panel Recent Labs    12/21/19 0421 12/22/19 0424  NA 138 135  K 4.2 3.9  CL 102 104  CO2 25 23  GLUCOSE 209* 193*  BUN 26* 35*  CREATININE 1.04* 0.88  CALCIUM 9.1 9.0  MG 2.1 2.2   Liver Function Tests No results for input(s): AST, ALT, ALKPHOS, BILITOT, PROT, ALBUMIN in the last 72 hours. No results for input(s): LIPASE, AMYLASE in the last 72 hours. Cardiac Enzymes No results for input(s): CKTOTAL, CKMB, CKMBINDEX, TROPONINI in the last 72 hours.  BNP: BNP (last 3 results) Recent Labs    11/26/19 2011  BNP 1,258.1*    ProBNP (last 3 results) No results for input(s): PROBNP in the last 8760 hours.   D-Dimer No results for input(s): DDIMER in the last 72 hours. Hemoglobin A1C No results for input(s): HGBA1C in the last 72 hours. Fasting Lipid Panel No results for input(s): CHOL, HDL, LDLCALC, TRIG, CHOLHDL, LDLDIRECT in the last 72 hours. Thyroid Function Tests No results for input(s): TSH, T4TOTAL, T3FREE, THYROIDAB in the  last 72 hours.  Invalid input(s): FREET3  Other results:   Imaging    No results found.   Medications:     Scheduled Medications: . aspirin EC  81 mg Oral Daily  . carvedilol  6.25 mg Oral BID WC  . Chlorhexidine Gluconate Cloth  6 each Topical Daily  . cholecalciferol  1,000 Units Oral Daily  . dapagliflozin propanediol  10 mg Oral Daily  . digoxin  0.125 mg Oral Daily  . folic acid  1 mg Oral Daily  . gabapentin  600 mg Oral BID  . insulin aspart  0-15 Units Subcutaneous TID WC  . insulin aspart  0-5 Units Subcutaneous QHS  . lidocaine  50 mg Intravenous Once  . linezolid  600 mg Oral BID  . loratadine  10 mg Oral Daily  . metFORMIN  500 mg Oral BID WC  . methotrexate  10 mg Oral Weekly  . multivitamin with minerals  1 tablet Oral Daily  . pantoprazole  80 mg  Oral Daily  . potassium chloride  20 mEq Oral Once  . pravastatin  10 mg Oral q1800  . sacubitril-valsartan  1 tablet Oral BID  . sodium chloride flush  10-40 mL Intracatheter Q12H  . sodium chloride flush  3 mL Intravenous Once  . sodium chloride flush  3 mL Intravenous Q12H  . spironolactone  25 mg Oral Daily    Infusions: . sodium chloride    . heparin 700 Units/hr (12/22/19 0600)  . lidocaine 1 mg/min (12/22/19 0600)  . methylPREDNISolone (SOLU-MEDROL) injection Stopped (12/21/19 1117)    PRN Medications: sodium chloride, acetaminophen, albuterol, fentaNYL (SUBLIMAZE) injection, HYDROcodone-acetaminophen, ondansetron (ZOFRAN) IV, ondansetron, sodium chloride flush, sodium chloride flush   Assessment/Plan   1. Chronic systolic CHF: Nonischemic cardiomyopathy. Echo last admission in 7/21 with EF newly decreased to 20-25% with septal-lateral dyssynchrony and septal severe hypokinesis, normal RV, moderate TR, moderate MR. Echo in 5/20 prior to PPM placement showed EF 60-65%. Possible causes of cardiomyopathy included cardiac sarcoidosis, chronic RV pacing, frequent NSVT/PVCs, ?CAD (has RFs), ?viral myocarditis (relatively recent COVID-19 infection). No ETOH/drugs. Developed shock overnight 11/28/19 w/ hypotension and narrow pulse pressure, w/ subsequent AKI, SCr 1.18>>1.85>>2.26. Inotropes/pressors limited by ventricular ectopy.  LHC/RHC 7/23 with no significant CAD, elevated filling pressures, and low cardiac output on norepinephrine. IABP placed. IABP removed 7/26. cMRI 7/27 showed patchy LGE in the basal septum, basal-mid inferoseptum/inferior wall, basal anterior wall, mid anterolateral wall. Cardiac MRI LGE pattern could be consistent with cardiac sarcoidosis (would explain earlier CHB). Alternatively, prior viral myocarditis (recent COVID-19). CT chest with ground glass lower lobes but no evidence for pulmonary sarcoidosis. She is now s/p device upgrade to MDT CRT-D. This  admission, she is not volume overloaded on exam and has NYHA class II symptoms.  I think that her HF is fairly well-compensated, recurrent ventricular arrhythmias appears to be the main issue.  -She does not need a loop diuretic for now. CVP 8 - Continue digoxin 0.125 daily. Dig level ok - Continue spironolactone 25 mg daily  - Continue Entresto 49/51 mg bid (BP too soft to titrate) - Continue Coreg 6.25 mg bid.  If she has BP room, would increase given VT (SBP 90s currently, will not change).  -Continuedapagliflozin 10 mg daily.  - Continue pulse Solumedrol 1 g IV daily x 5 days for treatment of cardiac sarcoidosis.  - [facility] cardiac sarcoidosis to confirm diagnosis.    - Cardiomyopathy likely  worsened by frequent VT/PVCs. Now on IV lidocaine to suppress.  2. Recurrent ventricular arrhythmias: She has had VT with multiple morphologies. Increased ventricular arrhythmias over 6 months. ?Underlying cause. No significant coronary disease. Concerned that she has cardiac sarcoidosis (would also explain CHB). Amiodarone stopped in the past due to torsades and very long QTc on amiodarone.  Now on lidocaine. Recurrent ventricular arrhythmias since decreasing prednisone makes me worry that she does indeed have cardiac sarcoidosis.  - Continue llidocaine 1 mg gtt. Level pending. Per EP plan to stop lidocaine 8/12  - Titrate up Coreg if able.  - Keep K > 4.0 Mg > 2.0 - Transitioned to IV Solumedrol as above.  - now on methotrexate  - She is now s/p ICD upgrade.  3. H/o CHB: Cause is uncertain. Cath in 5/20 prior to PPM showed no significant disease. Possible cardiac sarcoidosis by cMRI but no evidence for pulmonary sarcoidosis. Needs cardiac PET scan for further evaluation for sarcoidosis.  4. Right Groin Hematoma => wound infection: Hematoma developed 7/26 at IABP site after removal. She had a Femstop.  Hematoma resolved, but recently noted to have oozing at  wound site.  MRSA grew from culture and linezolid was started.  CT abdomen/pelvis showed no abscess.  - blood cultures NGTD  - Continue linezolid.  5. Rt IJ DVT - likely from prior central line.   - Will need anticoagulation for 3 months. Place CM consult to check cost of Xarelto vs Eliquis  - Per EP, ok to keep Lt IJ CVC and plan to remove tomorrow. No PICC   Length of Stay: 3  Lyda Jester, PA-C  12/22/2019, 7:39 AM  Advanced Heart Failure Team Pager [phone] (M-F; 7a - 4p)  Please contact [facility] for night-coverage after hours (4p -7a ) and weekends on [email or web address]  Patient seen with PA, agree with the above note.   Ventricular ectopy significant decreased on Solumedrol + lidocaine gtt.  CVP 8.  No complaints today.   General: NAD Neck: No JVD, no thyromegaly or thyroid nodule.  Lungs: Clear to auscultation bilaterally with normal respiratory effort. CV: Nondisplaced PMI.  Heart regular S1/S2, no S3/S4, no murmur.  No peripheral edema.   Abdomen: Soft, nontender, no hepatosplenomegaly, no distention.  Skin: Intact without lesions or rashes.  Neurologic: Alert and oriented x 3.  Psych: Normal affect. Extremities: No clubbing or cyanosis.  HEENT: Normal.   Will stop lidocaine today and start back on mexiletine.  Can remove CVL after that.   Stop Solumedrol after day 5 (Sunday) and start prednisone 40 mg daily.  Dr. Caryl Comes has started MTX.  Needs definitive diagnosis of cardiac sarcoidosis.  Cardiac MRI and clinical course highly suggestive.  Referral made for FDG PET.   Stable from HF standpoint.   Continue linezolid for groin infection (stable).   Can stop heparin and start Eliquis x 3 months for LIJ DVT.   Loralie Champagne 12/22/2019 2:01 PM

## 2019-12-23 LAB — BASIC METABOLIC PANEL
Anion gap: 10 (ref 5–15)
BUN: 28 mg/dL — ABNORMAL HIGH (ref 6–20)
CO2: 22 mmol/L (ref 22–32)
Calcium: 9.1 mg/dL (ref 8.9–10.3)
Chloride: 108 mmol/L (ref 98–111)
Creatinine, Ser: 0.85 mg/dL (ref 0.44–1.00)
GFR calc Af Amer: >60 mL/min (ref >60)
GFR calc non Af Amer: >60 mL/min (ref >60)
Glucose, Bld: 156 mg/dL — ABNORMAL HIGH (ref 70–99)
Potassium: 4.2 mmol/L (ref 3.5–5.1)
Sodium: 140 mmol/L (ref 135–145)

## 2019-12-23 LAB — CBC
HCT: 41.4 % (ref 36.0–46.0)
Hemoglobin: 12.8 g/dL (ref 12.0–15.0)
MCH: 26.9 pg (ref 26.0–34.0)
MCHC: 30.9 g/dL (ref 30.0–36.0)
MCV: 87 fL (ref 80.0–100.0)
Platelets: 261 10*3/uL (ref 150–400)
RBC: 4.76 MIL/uL (ref 3.87–5.11)
RDW: 16.9 % — ABNORMAL HIGH (ref 11.5–15.5)
WBC: 8.3 10*3/uL (ref 4.0–10.5)
nRBC: 0 % (ref 0.0–0.2)

## 2019-12-23 LAB — GLUCOSE, CAPILLARY
Glucose-Capillary: 234 mg/dL — ABNORMAL HIGH (ref 70–99)
Glucose-Capillary: 143 mg/dL — ABNORMAL HIGH (ref 70–99)
Glucose-Capillary: 199 mg/dL — ABNORMAL HIGH (ref 70–99)
Glucose-Capillary: 240 mg/dL — ABNORMAL HIGH (ref 70–99)

## 2019-12-23 LAB — MAGNESIUM: Magnesium: 1.9 mg/dL (ref 1.7–2.4)

## 2019-12-23 NOTE — Progress Notes (Signed)
Electrophysiology Rounding Note  Patient Name: Lisa Poole Date of Encounter: 12/23/2019  Primary Cardiologist: Dr. Aundra Dubin  Electrophysiologist: Dr. Lovena Le  Subjective   Without complaint no dyspnea; no palpitations   Inpatient Medications    Scheduled Meds: . apixaban  10 mg Oral BID   Followed by  . [START ON 12/29/2019] apixaban  5 mg Oral BID  . carvedilol  6.25 mg Oral BID WC  . cholecalciferol  1,000 Units Oral Daily  . dapagliflozin propanediol  10 mg Oral Daily  . digoxin  0.125 mg Oral Daily  . folic acid  1 mg Oral Daily  . gabapentin  600 mg Oral BID  . insulin aspart  0-15 Units Subcutaneous TID WC  . insulin aspart  0-5 Units Subcutaneous QHS  . linezolid  600 mg Oral BID  . loratadine  10 mg Oral Daily  . metFORMIN  500 mg Oral BID WC  . methotrexate  10 mg Oral Weekly  . mexiletine  200 mg Oral Q12H  . multivitamin with minerals  1 tablet Oral Daily  . pantoprazole  80 mg Oral Daily  . pravastatin  10 mg Oral q1800  . sacubitril-valsartan  1 tablet Oral BID  . sodium chloride flush  10-40 mL Intracatheter Q12H  . sodium chloride flush  3 mL Intravenous Once  . sodium chloride flush  3 mL Intravenous Q12H  . spironolactone  25 mg Oral Daily   Continuous Infusions: . sodium chloride    . methylPREDNISolone (SOLU-MEDROL) injection 1,000 mg (12/23/19 0947)   PRN Meds: sodium chloride, acetaminophen, albuterol, fentaNYL (SUBLIMAZE) injection, HYDROcodone-acetaminophen, ondansetron (ZOFRAN) IV, ondansetron, sodium chloride flush, sodium chloride flush   Vital Signs    Vitals:   12/22/19 1644 12/23/19 0011 12/23/19 0347 12/23/19 0904  BP: 108/73 110/72 (!) 108/57 99/61  Pulse: 79   69  Resp: 16 18 18 16   Temp: 98.2 F (36.8 C) 98.7 F (37.1 C) 98.2 F (36.8 C) (!) 97.3 F (36.3 C)  TempSrc: Oral Oral Oral Oral  SpO2: 95%   97%  Weight:   77 kg   Height:        Intake/Output Summary (Last 24 hours) at 12/23/2019 1014 Last data filed at  12/22/2019 1200 Gross per 24 hour  Intake 59.08 ml  Output --  Net 59.08 ml   Filed Weights   12/21/19 0641 12/22/19 0400 12/23/19 0347  Weight: 78.6 kg 79.4 kg 77 kg    Physical Exam  BP 99/61 (BP Location: Left Arm)   Pulse 69   Temp (!) 97.3 F (36.3 C) (Oral)   Resp 16   Ht 5' (1.524 m)   Wt 77 kg   LMP 07/25/2015 (Exact Date)   SpO2 97%   BMI 33.15 kg/m  Well developed and nourished in no acute distress HENT normal Neck supple with JVP-  flat   Clear Regular rate and rhythm, no murmurs or gallops Abd-soft with active BS No Clubbing cyanosis edema Skin-warm and dry A & Oriented  Grossly normal sensory and motor function       Labs    CBC Recent Labs    12/22/19 0424 12/23/19 0351  WBC 19.7* 8.3  HGB 13.4 12.8  HCT 43.2 41.4  MCV 88.3 87.0  PLT 328 546   Basic Metabolic Panel Recent Labs    12/22/19 1936 12/23/19 0351  NA 137 140  K 4.5 4.2  CL 104 108  CO2 23 22  GLUCOSE 290* 156*  BUN 30* 28*  CREATININE 1.08* 0.85  CALCIUM 9.3 9.1  MG 2.1 1.9   Liver Function Tests No results for input(s): AST, ALT, ALKPHOS, BILITOT, PROT, ALBUMIN in the last 72 hours. No results for input(s): LIPASE, AMYLASE in the last 72 hours. Cardiac Enzymes No results for input(s): CKTOTAL, CKMB, CKMBINDEX, TROPONINI in the last 72 hours.   Telemetry    NSR w VT sustained and treated successfully ATP   Radiology    No results found.  Patient Profile     Lisa Poole is a 54 year old woman with a history of ventricular tachycardia, chronic systolic congestive heart failure (ejection fraction 30%), diabetes, hypertension who presents from clinic with an increased ventricular tachycardia burden on her implanted biventricular ICD. Her ventricular tachycardia episodes have increased in frequency and required multiple ATP therapies. The patient does recall episodes of ventricular tachycardia and describes a rapid pounding in her chest.  Admitted for VT storm by  device interrogation. Symptomatic withlightheadednessanddizziness  Assessment & Plan    1.VT storm, multiple foci  2. Chronic systolic CHF   3. Rt groin wound   4. HTN   6. DM2    Intercurrent VT with ATP   Data do not suggest that VT is particularly amenable to antiinflammatory drugs, but will continue pulse steroids for now and mex  Euvolemic continue current meds  On linezolid for d5/8  Continue current dose of carvedilol as uptitration limited by blood pressure  She is to go back on venlafaxine following linezolid    Virl Axe, MD  12/23/2019, 10:14 AM

## 2019-12-24 DIAGNOSIS — D8685 Sarcoid myocarditis: Secondary | ICD-10-CM

## 2019-12-24 LAB — BASIC METABOLIC PANEL
Anion gap: 8 (ref 5–15)
Anion gap: 14 (ref 5–15)
BUN: 31 mg/dL — ABNORMAL HIGH (ref 6–20)
BUN: 33 mg/dL — ABNORMAL HIGH (ref 6–20)
CO2: 25 mmol/L (ref 22–32)
CO2: 19 mmol/L — ABNORMAL LOW (ref 22–32)
Calcium: 9.2 mg/dL (ref 8.9–10.3)
Calcium: 9.3 mg/dL (ref 8.9–10.3)
Chloride: 105 mmol/L (ref 98–111)
Chloride: 106 mmol/L (ref 98–111)
Creatinine, Ser: 0.89 mg/dL (ref 0.44–1.00)
Creatinine, Ser: 0.89 mg/dL (ref 0.44–1.00)
GFR calc Af Amer: >60 mL/min (ref >60)
GFR calc Af Amer: >60 mL/min (ref >60)
GFR calc non Af Amer: >60 mL/min (ref >60)
GFR calc non Af Amer: >60 mL/min (ref >60)
Glucose, Bld: 267 mg/dL — ABNORMAL HIGH (ref 70–99)
Glucose, Bld: 314 mg/dL — ABNORMAL HIGH (ref 70–99)
Potassium: 4 mmol/L (ref 3.5–5.1)
Potassium: 4 mmol/L (ref 3.5–5.1)
Sodium: 138 mmol/L (ref 135–145)
Sodium: 139 mmol/L (ref 135–145)

## 2019-12-24 LAB — CBC
HCT: 42 % (ref 36.0–46.0)
Hemoglobin: 12.8 g/dL (ref 12.0–15.0)
MCH: 26.7 pg (ref 26.0–34.0)
MCHC: 30.5 g/dL (ref 30.0–36.0)
MCV: 87.5 fL (ref 80.0–100.0)
Platelets: 272 10*3/uL (ref 150–400)
RBC: 4.8 MIL/uL (ref 3.87–5.11)
RDW: 16.7 % — ABNORMAL HIGH (ref 11.5–15.5)
WBC: 7 10*3/uL (ref 4.0–10.5)
nRBC: 0 % (ref 0.0–0.2)

## 2019-12-24 LAB — MAGNESIUM
Magnesium: 2.1 mg/dL (ref 1.7–2.4)
Magnesium: 1.9 mg/dL (ref 1.7–2.4)

## 2019-12-24 LAB — CULTURE, BLOOD (ROUTINE X 2)
Culture: NO GROWTH
Culture: NO GROWTH
Special Requests: ADEQUATE
Special Requests: ADEQUATE

## 2019-12-24 LAB — GLUCOSE, CAPILLARY
Glucose-Capillary: 126 mg/dL — ABNORMAL HIGH (ref 70–99)
Glucose-Capillary: 216 mg/dL — ABNORMAL HIGH (ref 70–99)
Glucose-Capillary: 212 mg/dL — ABNORMAL HIGH (ref 70–99)
Glucose-Capillary: 240 mg/dL — ABNORMAL HIGH (ref 70–99)

## 2019-12-24 MED ORDER — MEXILETINE HCL 250 MG PO CAPS
250.0000 mg | ORAL_CAPSULE | Freq: Two times a day (BID) | ORAL | Status: DC
  Administered 2019-12-24 – 2019-12-27 (×6): 250 mg via ORAL
  Filled 2019-12-24 (×7): qty 1

## 2019-12-24 NOTE — Progress Notes (Signed)
Patient had 5 beat run of Vtach, asymptomatic

## 2019-12-24 NOTE — Progress Notes (Signed)
Patient had a 1 minute run of [facility] and then her pacer paced her out of it. Patient stated "she felt it but was fine." blood pressure stable. I notified Richardson Dopp, no new orders. Will closely monitor.

## 2019-12-24 NOTE — Significant Event (Addendum)
Cardiology Event Note:  Called by RN for sustained symptomatic tachycardia this evening, rate 130s. On chart review, she is admitted for VT storm with CMR evidence of cardiac sarcoidosis. She is on Mexiletine 250mg  BID for suppression as well as carvedilol 6.26mg  and pulse dose Solumedrol for treatment of sarcoidosis. Underwent recent device upgrade to MDT CRT-D (DDD) with VT zone lowered to 130 bpm earlier this admission with ATP only (FVT zone 171, burst->ramp->shock).  On my evaluation her blood pressure is normal and heart rate is in the low 130s. She appears in mild distress and complaining of palpitations and over the last few minutes is beginning to experience some chest discomfort and lightheadedness.   ICD interrogation reveals sustained ventricular tachycardia. Slow VT detection zone programmed 130 bpm with burst pacing only. Although her rate is above 130, there is just enough R-R wobble right at the lower limit of VT detection to prevent the device from recording a sustained event and triggering tachytherapies. I lowered the VT zone to 128 bpm, quickly resulting VT detection and successful burst pacing on the first attempt. VT zone was left at 128 bpm. Will also check BMP and Mg and replete as needed. Her most recent ECG from 8/13 shows AP-VP rhythm with a normal QTc, however has a history of prolonged QTc with TdP on amiodarone. If VT becomes recurrent tonight, will consider changing mexiletine to lidocaine IV and monitor in ICU if needed. However, it is reassuring that these episodes seem to be very responsive to ATP. Discussed with the patient and her daughter. Will alert EP in the morning.

## 2019-12-24 NOTE — Progress Notes (Signed)
MD on call notified of pt having a run of svt. Hr sustaining in the 130s. Pt icd is not pacing her out of it. Cardiologist to come up & see pt.  Hoover Brunette, RN

## 2019-12-24 NOTE — Progress Notes (Signed)
Electrophysiology Rounding Note  Patient Name: Lisa Poole Date of Encounter: 12/24/2019  Primary Cardiologist: Dr. Aundra Dubin  Electrophysiologist: Dr. Lovena Le  Subjective   Without palpiations but some awareness of VT ( nurses)  Anxiety ok  Inpatient Medications    Scheduled Meds: . apixaban  10 mg Oral BID   Followed by  . [START ON 12/29/2019] apixaban  5 mg Oral BID  . carvedilol  6.25 mg Oral BID WC  . cholecalciferol  1,000 Units Oral Daily  . dapagliflozin propanediol  10 mg Oral Daily  . digoxin  0.125 mg Oral Daily  . folic acid  1 mg Oral Daily  . gabapentin  600 mg Oral BID  . insulin aspart  0-15 Units Subcutaneous TID WC  . insulin aspart  0-5 Units Subcutaneous QHS  . linezolid  600 mg Oral BID  . loratadine  10 mg Oral Daily  . metFORMIN  500 mg Oral BID WC  . methotrexate  10 mg Oral Weekly  . mexiletine  200 mg Oral Q12H  . multivitamin with minerals  1 tablet Oral Daily  . pantoprazole  80 mg Oral Daily  . pravastatin  10 mg Oral q1800  . sacubitril-valsartan  1 tablet Oral BID  . sodium chloride flush  10-40 mL Intracatheter Q12H  . sodium chloride flush  3 mL Intravenous Once  . sodium chloride flush  3 mL Intravenous Q12H  . spironolactone  25 mg Oral Daily   Continuous Infusions: . sodium chloride    . methylPREDNISolone (SOLU-MEDROL) injection 1,000 mg (12/24/19 1032)   PRN Meds: sodium chloride, acetaminophen, albuterol, fentaNYL (SUBLIMAZE) injection, HYDROcodone-acetaminophen, ondansetron (ZOFRAN) IV, ondansetron, sodium chloride flush, sodium chloride flush   Vital Signs    Vitals:   12/23/19 1947 12/23/19 2300 12/24/19 0500 12/24/19 0739  BP:  108/70 (!) 89/46 123/76  Pulse:    69  Resp: 18 20 18 20   Temp: 98.3 F (36.8 C) 98 F (36.7 C) 98.2 F (36.8 C) 98.1 F (36.7 C)  TempSrc: Oral Oral Oral Oral  SpO2:    97%  Weight:   78.4 kg   Height:        Intake/Output Summary (Last 24 hours) at 12/24/2019 1104 Last data filed at  12/23/2019 1800 Gross per 24 hour  Intake 285.93 ml  Output --  Net 285.93 ml   Filed Weights   12/22/19 0400 12/23/19 0347 12/24/19 0500  Weight: 79.4 kg 77 kg 78.4 kg    Physical Exam  BP 123/76 (BP Location: Left Arm)   Pulse 69   Temp 98.1 F (36.7 C) (Oral)   Resp 20   Ht 5' (1.524 m)   Wt 78.4 kg   LMP 07/25/2015 (Exact Date)   SpO2 97%   BMI 33.76 kg/m  Well developed and nourished in no acute distress HENT normal Neck supple with JVP-  flat   Clear Regular rate and rhythm, no murmurs or gallops Abd-soft with active BS No Clubbing cyanosis edema Skin-warm and dry A & Oriented  Grossly normal sensory and motor function         Labs    CBC Recent Labs    12/23/19 0351 12/24/19 0925  WBC 8.3 7.0  HGB 12.8 12.8  HCT 41.4 42.0  MCV 87.0 87.5  PLT 261 272   Basic Metabolic Panel Recent Labs    12/23/19 0351 12/24/19 0925  NA 140 138  K 4.2 4.0  CL 108 105  CO2 22 25  GLUCOSE 156* 267*  BUN 28* 31*  CREATININE 0.85 0.89  CALCIUM 9.1 9.2  MG 1.9 2.1   Liver Function Tests No results for input(s): AST, ALT, ALKPHOS, BILITOT, PROT, ALBUMIN in the last 72 hours. No results for input(s): LIPASE, AMYLASE in the last 72 hours. Cardiac Enzymes No results for input(s): CKTOTAL, CKMB, CKMBINDEX, TROPONINI in the last 72 hours.   Telemetry    NSR with VT x 2 Rx with ATP   Radiology    No results found.  Patient Profile     Lisa Poole is a 54 year old woman with a history of ventricular tachycardia, chronic systolic congestive heart failure (ejection fraction 30%), diabetes, hypertension who presents from clinic with an increased ventricular tachycardia burden on her implanted biventricular ICD. Her ventricular tachycardia episodes have increased in frequency and required multiple ATP therapies. The patient does recall episodes of ventricular tachycardia and describes a rapid pounding in her chest.  Admitted for VT storm by device  interrogation. Symptomatic withlightheadednessanddizziness  Assessment & Plan    1.VT storm, multiple foci  2. Chronic systolic CHF   3. Rt groin wound   4. HTN   6. DM2   Euvolemic continue current meds  Continue mex and pulse steroids, will change to po in am  Recommended doses are 40-60 mg/day  The data are not strong that immsupp will settle VT; will increase mex for now, although encouraged that ATP has been successful on each occasion   Hopefully home 48  Hrs or so   On linezolid for d6/8 She is to go back on venlafaxine following linezolid    Virl Axe, MD  12/24/2019, 11:04 AM

## 2019-12-25 LAB — CBC
HCT: 42.4 % (ref 36.0–46.0)
Hemoglobin: 13 g/dL (ref 12.0–15.0)
MCH: 26.7 pg (ref 26.0–34.0)
MCHC: 30.7 g/dL (ref 30.0–36.0)
MCV: 87.2 fL (ref 80.0–100.0)
Platelets: 233 10*3/uL (ref 150–400)
RBC: 4.86 MIL/uL (ref 3.87–5.11)
RDW: 16.8 % — ABNORMAL HIGH (ref 11.5–15.5)
WBC: 6.9 10*3/uL (ref 4.0–10.5)
nRBC: 0 % (ref 0.0–0.2)

## 2019-12-25 LAB — BASIC METABOLIC PANEL
Anion gap: 9 (ref 5–15)
BUN: 29 mg/dL — ABNORMAL HIGH (ref 6–20)
CO2: 21 mmol/L — ABNORMAL LOW (ref 22–32)
Calcium: 9.1 mg/dL (ref 8.9–10.3)
Chloride: 108 mmol/L (ref 98–111)
Creatinine, Ser: 0.83 mg/dL (ref 0.44–1.00)
GFR calc Af Amer: >60 mL/min (ref >60)
GFR calc non Af Amer: >60 mL/min (ref >60)
Glucose, Bld: 210 mg/dL — ABNORMAL HIGH (ref 70–99)
Potassium: 3.8 mmol/L (ref 3.5–5.1)
Sodium: 138 mmol/L (ref 135–145)

## 2019-12-25 LAB — GLUCOSE, CAPILLARY
Glucose-Capillary: 221 mg/dL — ABNORMAL HIGH (ref 70–99)
Glucose-Capillary: 183 mg/dL — ABNORMAL HIGH (ref 70–99)
Glucose-Capillary: 184 mg/dL — ABNORMAL HIGH (ref 70–99)
Glucose-Capillary: 206 mg/dL — ABNORMAL HIGH (ref 70–99)

## 2019-12-25 LAB — MAGNESIUM: Magnesium: 2.1 mg/dL (ref 1.7–2.4)

## 2019-12-25 MED ORDER — POTASSIUM CHLORIDE CRYS ER 20 MEQ PO TBCR
40.0000 meq | EXTENDED_RELEASE_TABLET | Freq: Once | ORAL | Status: AC
  Administered 2019-12-25: 40 meq via ORAL
  Filled 2019-12-25: qty 2

## 2019-12-25 MED ORDER — PREDNISONE 20 MG PO TABS
60.0000 mg | ORAL_TABLET | Freq: Every day | ORAL | Status: DC
  Administered 2019-12-25 – 2019-12-27 (×3): 60 mg via ORAL
  Filled 2019-12-25 (×3): qty 3

## 2019-12-25 NOTE — Progress Notes (Addendum)
Patient ID: [name], female   DOB: [birth year], 54 y.o.   MRN: [id number]     Advanced Heart Failure Rounding Note  PCP-Cardiologist: No primary care provider on file.   Subjective:    Had recurrent slow VT overnight, around 9PM, that terminated w/ ATP. No recurrence.  K 3.8.  Mg 2.1. On Mexiletine 250 mg q12.  Continues on pulse dose solumedrol + methotrexate for presumed cardiac sarcoidosis   Continues on linezolid for MRSA wound infection. Remains AF. WBC 6.9.  Blood cultures NGTD.   She feels ok currently. No dyspnea. No recurrent palpitations since her run of VT last night.   Objective:   Weight Range: 77.6 kg Body mass index is 33.41 kg/m.   Vital Signs:   Temp:  [97.9 F (36.6 C)-98.3 F (36.8 C)] 98 F (36.7 C) (08/16 0805) Pulse Rate:  [70-80] 74 (08/16 0805) Resp:  [18] 18 (08/15 1942) BP: (88-108)/(56-73) 98/56 (08/16 0805) SpO2:  [96 %-98 %] 96 % (08/16 0805) Weight:  [77.6 kg] 77.6 kg (08/16 0525) Last BM Date: 12/24/19  Weight change: Filed Weights   12/23/19 0347 12/24/19 0500 12/25/19 0525  Weight: 77 kg 78.4 kg 77.6 kg    Intake/Output:   Intake/Output Summary (Last 24 hours) at 12/25/2019 0812 Last data filed at 12/25/2019 0100 Gross per 24 hour  Intake 840 ml  Output --  Net 840 ml      Physical Exam    General:  Well appearing. No respiratory difficulty HEENT: normal Neck: supple. no JVD. Carotids 2+ bilat; no bruits. No lymphadenopathy or thyromegaly appreciated. Cor: PMI nondisplaced. Regular rate & rhythm. No rubs, gallops or murmurs. Lungs: clear Abdomen: soft, nontender, nondistended. No hepatosplenomegaly. No bruits or masses. Good bowel sounds. Extremities: no cyanosis, clubbing, rash, edema Neuro: alert & oriented x 3, cranial nerves grossly intact. moves all 4 extremities w/o difficulty. Affect pleasant.  Telemetry   V-paced. 90s currently, VT overnight    Labs    CBC Recent Labs    12/24/19 0925 12/25/19 0616   WBC 7.0 6.9  HGB 12.8 13.0  HCT 42.0 42.4  MCV 87.5 87.2  PLT 272 474   Basic Metabolic Panel Recent Labs    12/24/19 2201 12/25/19 0616  NA 139 138  K 4.0 3.8  CL 106 108  CO2 19* 21*  GLUCOSE 314* 210*  BUN 33* 29*  CREATININE 0.89 0.83  CALCIUM 9.3 9.1  MG 1.9 2.1   Liver Function Tests No results for input(s): AST, ALT, ALKPHOS, BILITOT, PROT, ALBUMIN in the last 72 hours. No results for input(s): LIPASE, AMYLASE in the last 72 hours. Cardiac Enzymes No results for input(s): CKTOTAL, CKMB, CKMBINDEX, TROPONINI in the last 72 hours.  BNP: BNP (last 3 results) Recent Labs    11/26/19 2011  BNP 1,258.1*    ProBNP (last 3 results) No results for input(s): PROBNP in the last 8760 hours.   D-Dimer No results for input(s): DDIMER in the last 72 hours. Hemoglobin A1C No results for input(s): HGBA1C in the last 72 hours. Fasting Lipid Panel No results for input(s): CHOL, HDL, LDLCALC, TRIG, CHOLHDL, LDLDIRECT in the last 72 hours. Thyroid Function Tests No results for input(s): TSH, T4TOTAL, T3FREE, THYROIDAB in the last 72 hours.  Invalid input(s): FREET3  Other results:   Imaging    No results found.   Medications:     Scheduled Medications: . apixaban  10 mg Oral BID   Followed by  . [START ON  12/29/2019] apixaban  5 mg Oral BID  . carvedilol  6.25 mg Oral BID WC  . cholecalciferol  1,000 Units Oral Daily  . dapagliflozin propanediol  10 mg Oral Daily  . digoxin  0.125 mg Oral Daily  . folic acid  1 mg Oral Daily  . gabapentin  600 mg Oral BID  . insulin aspart  0-15 Units Subcutaneous TID WC  . insulin aspart  0-5 Units Subcutaneous QHS  . linezolid  600 mg Oral BID  . loratadine  10 mg Oral Daily  . metFORMIN  500 mg Oral BID WC  . methotrexate  10 mg Oral Weekly  . mexiletine  250 mg Oral Q12H  . multivitamin with minerals  1 tablet Oral Daily  . pantoprazole  80 mg Oral Daily  . pravastatin  10 mg Oral q1800  . sacubitril-valsartan   1 tablet Oral BID  . sodium chloride flush  10-40 mL Intracatheter Q12H  . sodium chloride flush  3 mL Intravenous Once  . sodium chloride flush  3 mL Intravenous Q12H  . spironolactone  25 mg Oral Daily    Infusions: . sodium chloride    . methylPREDNISolone (SOLU-MEDROL) injection 1,000 mg (12/24/19 1032)    PRN Medications: sodium chloride, acetaminophen, albuterol, fentaNYL (SUBLIMAZE) injection, HYDROcodone-acetaminophen, ondansetron (ZOFRAN) IV, ondansetron, sodium chloride flush, sodium chloride flush   Assessment/Plan   1. Chronic systolic CHF: Nonischemic cardiomyopathy. Echo last admission in 7/21 with EF newly decreased to 20-25% with septal-lateral dyssynchrony and septal severe hypokinesis, normal RV, moderate TR, moderate MR. Echo in 5/20 prior to PPM placement showed EF 60-65%. Possible causes of cardiomyopathy included cardiac sarcoidosis, chronic RV pacing, frequent NSVT/PVCs, ?CAD (has RFs), ?viral myocarditis (relatively recent COVID-19 infection). No ETOH/drugs. Developed shock overnight 11/28/19 w/ hypotension and narrow pulse pressure, w/ subsequent AKI, SCr 1.18>>1.85>>2.26. Inotropes/pressors limited by ventricular ectopy.  LHC/RHC 7/23 with no significant CAD, elevated filling pressures, and low cardiac output on norepinephrine. IABP placed. IABP removed 7/26. cMRI 7/27 showed patchy LGE in the basal septum, basal-mid inferoseptum/inferior wall, basal anterior wall, mid anterolateral wall. Cardiac MRI LGE pattern could be consistent with cardiac sarcoidosis (would explain earlier CHB). Alternatively, prior viral myocarditis (recent COVID-19). CT chest with ground glass lower lobes but no evidence for pulmonary sarcoidosis. She is now s/p device upgrade to MDT CRT-D. This admission, she is not volume overloaded on exam and has NYHA class II symptoms.  I think that her HF is fairly well-compensated, recurrent ventricular arrhythmias appears to be the main issue.  -She  does not need a loop diuretic for now.  - Continue digoxin 0.125 daily. Dig level ok - Continue spironolactone 25 mg daily  - Continue Entresto 49/51 mg bid (BP too soft to titrate) - Continue Coreg 6.25 mg bid.  If she has BP room, would increase given VT (SBP 90s currently, will not change).  -Continuedapagliflozin 10 mg daily.  - Continue pulse Solumedrol 1 g IV daily x 5 days for treatment of cardiac sarcoidosis. Plan to transition back to prednisone today  - [facility] cardiac sarcoidosis to confirm diagnosis.    - Cardiomyopathy likely worsened by frequent VT/PVCs. Now on Mexiletine to suppress.  2. Recurrent ventricular arrhythmias: She has had VT with multiple morphologies. Increased ventricular arrhythmias over 6 months. ?Underlying cause. No significant coronary disease. Concerned that she has cardiac sarcoidosis (would also explain CHB). Amiodarone stopped in the past due to torsades and very long QTc  on amiodarone. switched to lidocaine w/ good response. Now on Mexiletine. Recurrent ventricular arrhythmias since decreasing prednisone makes me worry that she does indeed have cardiac sarcoidosis.  - Continue Mexiletine 250 mg bid  - Continue Coreg 6.25 mg bid (BP too soft for titration) - Keep K > 4.0 Mg > 2.0. Will supp K today (3.8) - Transitioned to IV Solumedrol as above. Plan to switch back to prednisone today  - now on methotrexate  - She is now s/p ICD upgrade.  3. H/o CHB: Cause is uncertain. Cath in 5/20 prior to PPM showed no significant disease. Possible cardiac sarcoidosis by cMRI but no evidence for pulmonary sarcoidosis. Needs cardiac PET scan for further evaluation for sarcoidosis.  4. Right Groin Hematoma => wound infection: Hematoma developed 7/26 at IABP site after removal. She had a Femstop.  Hematoma resolved, but recently noted to have oozing at wound site.  MRSA grew from culture and linezolid was started.  CT  abdomen/pelvis showed no abscess.  - blood cultures NGTD  - Continue linezolid.  5. Rt IJ DVT - likely from prior central line.   - Will need anticoagulation for 3 months. She is now on Eliquis, treatment dose.  Length of Stay: [address] PA-C  12/25/2019, 8:12 AM  Advanced Heart Failure Team Pager [phone] (M-F; 7a - 4p)  Please contact [facility] for night-coverage after hours (4p -7a ) and weekends on [email or web address]  Patient seen and examined with the above-signed Advanced Practice Provider and/or Housestaff. I personally reviewed laboratory data, imaging studies and relevant notes. I independently examined the patient and formulated the important aspects of the plan. I have edited the note to reflect any of my changes or salient points. I have personally discussed the plan with the patient and/or family.  Had recurrent VT overnight. Broke with ATP. Remains on steroids, mexilitine and MTX. Volume status ok   General:  Well appearing. No resp difficulty HEENT: normal Neck: supple. no JVD. Carotids 2+ bilat; no bruits. No lymphadenopathy or thryomegaly appreciated. Cor: PMI nondisplaced. Regular rate & rhythm. No rubs, gallops or murmurs. Lungs: clear Abdomen: soft, nontender, nondistended. No hepatosplenomegaly. No bruits or masses. Good bowel sounds. Extremities: no cyanosis, clubbing, rash, edema Neuro: alert & orientedx3, cranial nerves grossly intact. moves all 4 extremities w/o difficulty. Affect pleasant  Continues with salvos of VT terminated with ATP. EP managing. HF stable on current regimen. On Eliquis for DVT. Supping electrolytes.  Glori Bickers, MD  5:25 PM

## 2019-12-25 NOTE — Care Management Important Message (Addendum)
Important Message  Patient Details  Name: Lisa Poole MRN: [id number] Date of Birth: [birth year]   Medicare Important Message Given:  Yes  Pt. On precautions gave IM letter to Lisa Poole.    Lisa Poole 12/25/2019, 11:46 AM

## 2019-12-25 NOTE — TOC Initial Note (Signed)
Transition of Care [facility]) - Initial/Assessment Note    Patient Details  Name: Lisa Poole MRN: [id number] Date of Birth: [birth year]  Transition of Care [facility]) CM/SW Contact:    Ninfa Meeker, RN Phone Number: [phone] 12/25/2019, 1:33 PM  Clinical Narrative:  Patient is a 54 year old woman with a history of ventricular tachycardia, chronic systolic congestive heart failure (ejection fraction 30%), diabetes, hypertension who was admitted from clinic with an increased ventricular tachycardia burden on her implanted biventricular ICD. Patient being diagnosed with cardiac sarcoidosis, will have further workup. Case manager spoke with patient via telephone concerning discharge need for HF program. Choice was discussed, referral was called to Adela Lank, [facility]  Liaison. Patient will have support of husband and family at discharge. TOC Team will continue to monitor.            Expected Discharge Plan: [place] Barriers to Discharge: Continued Medical Work up   Patient Goals and CMS Choice Patient states their goals for this hospitalization and ongoing recovery are:: get home CMS Medicare.gov Compare Post Acute Care list provided to:: Patient Choice offered to / list presented to : Patient  Expected Discharge Plan and Services Expected Discharge Plan: [place]   Discharge Planning Services: CM Consult Post Acute Care Choice: [place] arrangements for the past 2 months: [place] Arranged: RN (Heart Failure Program) [place] Agency: [place] Date [place]: 12/25/19 Time HH Agency Contacted: [facility] Representative spoke with at [place]: Adela Lank  Prior Living Arrangements/Services Living arrangements for the past 2 months: [place] Lives with:: Spouse Patient language and need for interpreter reviewed:: Yes Do you feel safe going back to the  place where you live?: Yes      Need for Family Participation in Patient Care: Yes (Comment) Care giver support system in place?: Yes (comment)   Criminal Activity/Legal Involvement Pertinent to Current Situation/Hospitalization: No - Comment as needed  Activities of Daily Living Home Assistive Devices/Equipment: Eyeglasses ADL Screening (condition at time of admission) Patient's cognitive ability adequate to safely complete daily activities?: Yes Is the patient deaf or have difficulty hearing?: No Does the patient have difficulty seeing, even when wearing glasses/contacts?: No Does the patient have difficulty concentrating, remembering, or making decisions?: No Patient able to express need for assistance with ADLs?: Yes Does the patient have difficulty dressing or bathing?: No Independently performs ADLs?: Yes (appropriate for developmental age) Does the patient have difficulty walking or climbing stairs?: Yes Weakness of Legs: Both Weakness of Arms/Hands: None  Permission Sought/Granted Permission sought to share information with : Case Manager Permission granted to share information with : Yes, Verbal Permission Granted     Permission granted to share info w AGENCY: [facility]        Emotional Assessment   Attitude/Demeanor/Rapport: Gracious   Orientation: : Oriented to Self, Oriented to Place, Oriented to  Time, Oriented to Situation Alcohol / Substance Use: Not Applicable Psych Involvement: No (comment)  Admission diagnosis:  Ventricular tachycardia ([place]) [I47.2] VT (ventricular tachycardia) ([place]) [I47.2] Patient Active Problem List   Diagnosis Date Noted   Ventricular tachycardia ([place]) 12/19/2019   Wound infection after surgery 79/89/2119   Chronic systolic heart failure (HCC)    Shortness of breath  Acute decompensated heart failure ([place]) 11/26/2019   Pacemaker 05/18/2019   Uterovaginal prolapse, incomplete 02/15/2019   Complete heart block  ([place]) 02/11/2019   Cystocele without uterine prolapse 12/12/2018   Elevated troponin level    Chest discomfort    NSTEMI (non-ST elevated myocardial infarction) ([place]) 09/27/2018   Primary osteoarthritis of left knee 12/10/2017   S/P knee replacement 12/10/2017   Pain in left knee 10/20/2017   Tear of right rotator cuff 05/20/2017   Acromioclavicular arthrosis 08/01/2013   Aortic insufficiency    Dry eye syndrome 11/25/2007   Asthma 09/13/2006   Diabetes mellitus ([place]) 09/13/2006   HTN (hypertension) 09/13/2006   PCP:  Milford Cage, PA Pharmacy:   [facility] [place], [place] AT [address] [place] [address] Phone: [phone] Fax: [phone]  [facility] [place], [place] - [address] [place] [address] Phone: [phone] Fax: [phone]     Social Determinants of Health (SDOH) Interventions    Readmission Risk Interventions No flowsheet data found.

## 2019-12-25 NOTE — Progress Notes (Addendum)
Inpatient Diabetes Program Recommendations  AACE/ADA: New Consensus Statement on Inpatient Glycemic Control (2015)  Target Ranges:  Prepandial:   less than 140 mg/dL      Peak postprandial:   less than 180 mg/dL (1-2 hours)      Critically ill patients:  140 - 180 mg/dL   Lab Results  Component Value Date   GLUCAP 221 (H) 12/25/2019   HGBA1C 7.2 (H) 11/28/2019    Review of Glycemic Control Results for Lisa Poole, Lisa Poole (MRN [id number]) as of 12/25/2019 09:16  Ref. Range 12/24/2019 07:46 12/24/2019 11:45 12/24/2019 15:57 12/24/2019 20:37 12/25/2019 08:01  Glucose-Capillary Latest Ref Range: 70 - 99 mg/dL 126 (H) 216 (H) 212 (H) 240 (H) 221 (H)    Diabetes history: DM 2  Outpatient Diabetes medications:  Metformin 500 mg bid Farxiga 10 mg Daily  Current orders for Inpatient glycemic control:  Metformin 500 mg bid Farxiga 10 mg Daily Novolog 0-15 units tid  Novolog 0-5 QHS Solumedrol 1000 mg Daily  Inpatient Diabetes Program Recommendations:    If steroids continue, please consider,  Heart health Carb modified diet Novolog 3 units meal coverage tid if eats at least 50% of meals  Will continue to follow while inpatient.  Thank you, Reche Dixon, RN, BSN Diabetes Coordinator Inpatient Diabetes Program [phone] (team pager from 8a-5p)

## 2019-12-25 NOTE — Progress Notes (Signed)
EP Progress Note  Patient Name: Lisa Poole Date of Encounter: 12/25/2019  Primary Cardiologist: No primary care provider on file.   Subjective   No complaints today. Several short episodes of VT (monomorphic) in last 24 hours treated with ATP.   Inpatient Medications    Scheduled Meds: . apixaban  10 mg Oral BID   Followed by  . [START ON 12/29/2019] apixaban  5 mg Oral BID  . carvedilol  6.25 mg Oral BID WC  . cholecalciferol  1,000 Units Oral Daily  . dapagliflozin propanediol  10 mg Oral Daily  . digoxin  0.125 mg Oral Daily  . folic acid  1 mg Oral Daily  . gabapentin  600 mg Oral BID  . insulin aspart  0-15 Units Subcutaneous TID WC  . insulin aspart  0-5 Units Subcutaneous QHS  . linezolid  600 mg Oral BID  . loratadine  10 mg Oral Daily  . metFORMIN  500 mg Oral BID WC  . methotrexate  10 mg Oral Weekly  . mexiletine  250 mg Oral Q12H  . multivitamin with minerals  1 tablet Oral Daily  . pantoprazole  80 mg Oral Daily  . pravastatin  10 mg Oral q1800  . predniSONE  60 mg Oral Q breakfast  . sacubitril-valsartan  1 tablet Oral BID  . sodium chloride flush  10-40 mL Intracatheter Q12H  . sodium chloride flush  3 mL Intravenous Once  . sodium chloride flush  3 mL Intravenous Q12H  . spironolactone  25 mg Oral Daily   Continuous Infusions: . sodium chloride     PRN Meds: sodium chloride, acetaminophen, albuterol, fentaNYL (SUBLIMAZE) injection, HYDROcodone-acetaminophen, ondansetron (ZOFRAN) IV, ondansetron, sodium chloride flush, sodium chloride flush   Vital Signs    Vitals:   12/24/19 1942 12/25/19 0055 12/25/19 0525 12/25/19 0805  BP: 101/73 (!) 88/61 108/71 (!) 98/56  Pulse:  80 70 74  Resp: 18     Temp: 98 F (36.7 C) 97.9 F (36.6 C) 98.3 F (36.8 C) 98 F (36.7 C)  TempSrc: Oral Oral Oral Oral  SpO2:  97% 98% 96%  Weight:   77.6 kg   Height:        Intake/Output Summary (Last 24 hours) at 12/25/2019 0948 Last data filed at 12/25/2019  0100 Gross per 24 hour  Intake 600 ml  Output --  Net 600 ml   Filed Weights   12/23/19 0347 12/24/19 0500 12/25/19 0525  Weight: 77 kg 78.4 kg 77.6 kg    Telemetry    VT overnight - Personally Reviewed  ECG      Physical Exam   GEN: No acute distress.   Neck: No JVD Cardiac: RRR, no murmurs, rubs, or gallops.  Respiratory: Clear to auscultation bilaterally. GI: Soft, nontender, non-distended  MS: No edema; No deformity. Neuro:  Nonfocal  Psych: Normal affect   Labs    Chemistry Recent Labs  Lab 12/24/19 0925 12/24/19 2201 12/25/19 0616  NA 138 139 138  K 4.0 4.0 3.8  CL 105 106 108  CO2 25 19* 21*  GLUCOSE 267* 314* 210*  BUN 31* 33* 29*  CREATININE 0.89 0.89 0.83  CALCIUM 9.2 9.3 9.1  GFRNONAA >60 >60 >60  GFRAA >60 >60 >60  ANIONGAP 8 14 9      Hematology Recent Labs  Lab 12/23/19 0351 12/24/19 0925 12/25/19 0616  WBC 8.3 7.0 6.9  RBC 4.76 4.80 4.86  HGB 12.8 12.8 13.0  HCT 41.4 42.0  42.4  MCV 87.0 87.5 87.2  MCH 26.9 26.7 26.7  MCHC 30.9 30.5 30.7  RDW 16.9* 16.7* 16.8*  PLT 261 272 233    Cardiac EnzymesNo results for input(s): TROPONINI in the last 168 hours. No results for input(s): TROPIPOC in the last 168 hours.   BNPNo results for input(s): BNP, PROBNP in the last 168 hours.   DDimer No results for input(s): DDIMER in the last 168 hours.    Patient Profile     54 y.o. female admitted with VT. Etiology of her VT is most consistent with sarcoid given ability to suppress with steroids, echo appearance.   Assessment & Plan    1. VT storm 2. Chronic systolic HF 3. Rt groin wound 4. HTN 5. DM2  Doing well overall with less VT burden after pulse dose steroids. Will transition to PO today and continue mexiletine and methotrexate. Appreciate diabetes team assistant with blood sugar control.     For questions or updates, please contact [place] Please consult [email or web address] for contact info under Cardiology/STEMI.       Signed, Lars Mage, MD  12/25/2019, 9:48 AM

## 2019-12-26 LAB — BASIC METABOLIC PANEL
Anion gap: 7 (ref 5–15)
BUN: 25 mg/dL — ABNORMAL HIGH (ref 6–20)
CO2: 22 mmol/L (ref 22–32)
Calcium: 8.9 mg/dL (ref 8.9–10.3)
Chloride: 108 mmol/L (ref 98–111)
Creatinine, Ser: 0.76 mg/dL (ref 0.44–1.00)
GFR calc Af Amer: >60 mL/min (ref >60)
GFR calc non Af Amer: >60 mL/min (ref >60)
Glucose, Bld: 177 mg/dL — ABNORMAL HIGH (ref 70–99)
Potassium: 4.4 mmol/L (ref 3.5–5.1)
Sodium: 137 mmol/L (ref 135–145)

## 2019-12-26 LAB — GLUCOSE, CAPILLARY
Glucose-Capillary: 87 mg/dL (ref 70–99)
Glucose-Capillary: 221 mg/dL — ABNORMAL HIGH (ref 70–99)
Glucose-Capillary: 166 mg/dL — ABNORMAL HIGH (ref 70–99)
Glucose-Capillary: 214 mg/dL — ABNORMAL HIGH (ref 70–99)

## 2019-12-26 LAB — MAGNESIUM: Magnesium: 1.8 mg/dL (ref 1.7–2.4)

## 2019-12-26 MED ORDER — LINEZOLID 600 MG PO TABS
600.0000 mg | ORAL_TABLET | Freq: Two times a day (BID) | ORAL | Status: AC
  Administered 2019-12-26: 600 mg via ORAL
  Filled 2019-12-26: qty 1

## 2019-12-26 MED ORDER — VENLAFAXINE HCL ER 75 MG PO CP24
75.0000 mg | ORAL_CAPSULE | Freq: Every day | ORAL | Status: DC
  Administered 2019-12-27: 75 mg via ORAL
  Filled 2019-12-26: qty 1

## 2019-12-26 MED ORDER — MAGNESIUM SULFATE 2 GM/50ML IV SOLN
2.0000 g | Freq: Once | INTRAVENOUS | Status: AC
  Administered 2019-12-26: 2 g via INTRAVENOUS
  Filled 2019-12-26: qty 50

## 2019-12-26 NOTE — Progress Notes (Addendum)
Electrophysiology Rounding Note  Patient Name: Lisa Poole Date of Encounter: 12/26/2019  Primary Cardiologist: Dr. Haroldine Laws  Electrophysiologist: Dr. Lovena Le   Subjective   The patient is doing well today.  Short NSVT overnight but nothing further sustained.   Anxious to go home.   Inpatient Medications    Scheduled Meds: . apixaban  10 mg Oral BID   Followed by  . [START ON 12/29/2019] apixaban  5 mg Oral BID  . carvedilol  6.25 mg Oral BID WC  . cholecalciferol  1,000 Units Oral Daily  . dapagliflozin propanediol  10 mg Oral Daily  . digoxin  0.125 mg Oral Daily  . folic acid  1 mg Oral Daily  . gabapentin  600 mg Oral BID  . insulin aspart  0-15 Units Subcutaneous TID WC  . insulin aspart  0-5 Units Subcutaneous QHS  . linezolid  600 mg Oral BID  . loratadine  10 mg Oral Daily  . metFORMIN  500 mg Oral BID WC  . methotrexate  10 mg Oral Weekly  . mexiletine  250 mg Oral Q12H  . multivitamin with minerals  1 tablet Oral Daily  . pantoprazole  80 mg Oral Daily  . pravastatin  10 mg Oral q1800  . predniSONE  60 mg Oral Q breakfast  . sacubitril-valsartan  1 tablet Oral BID  . sodium chloride flush  10-40 mL Intracatheter Q12H  . sodium chloride flush  3 mL Intravenous Once  . sodium chloride flush  3 mL Intravenous Q12H  . spironolactone  25 mg Oral Daily   Continuous Infusions: . sodium chloride     PRN Meds: sodium chloride, acetaminophen, albuterol, fentaNYL (SUBLIMAZE) injection, HYDROcodone-acetaminophen, ondansetron (ZOFRAN) IV, ondansetron, sodium chloride flush, sodium chloride flush   Vital Signs    Vitals:   12/25/19 1756 12/25/19 1927 12/26/19 0007 12/26/19 0420  BP: 115/72 113/72 110/68 98/64  Pulse: 73     Resp:  18 18 20   Temp:  98.7 F (37.1 C) 98 F (36.7 C) 98.7 F (37.1 C)  TempSrc:  Oral Oral Oral  SpO2: 96%     Weight:    77.2 kg  Height:       No intake or output data in the 24 hours ending 12/26/19 0924 Filed Weights    12/24/19 0500 12/25/19 0525 12/26/19 0420  Weight: 78.4 kg 77.6 kg 77.2 kg    Physical Exam    GEN- The patient is well appearing, alert and oriented x 3 today.   Head- normocephalic, atraumatic Eyes-  Sclera clear, conjunctiva pink Ears- hearing intact Oropharynx- clear Neck- supple Lungs- Clear to ausculation bilaterally, normal work of breathing Heart- Regular rate and rhythm, no murmurs, rubs or gallops GI- soft, NT, ND, + BS Extremities- no clubbing or cyanosis. No edema Skin- no rash or lesion Psych- euthymic mood, full affect Neuro- strength and sensation are intact  Labs    CBC Recent Labs    12/24/19 0925 12/25/19 0616  WBC 7.0 6.9  HGB 12.8 13.0  HCT 42.0 42.4  MCV 87.5 87.2  PLT 272 941   Basic Metabolic Panel Recent Labs    12/25/19 0616 12/26/19 0342  NA 138 137  K 3.8 4.4  CL 108 108  CO2 21* 22  GLUCOSE 210* 177*  BUN 29* 25*  CREATININE 0.83 0.76  CALCIUM 9.1 8.9  MG 2.1 1.8   Liver Function Tests No results for input(s): AST, ALT, ALKPHOS, BILITOT, PROT, ALBUMIN in the last  72 hours. No results for input(s): LIPASE, AMYLASE in the last 72 hours. Cardiac Enzymes No results for input(s): CKTOTAL, CKMB, CKMBINDEX, TROPONINI in the last 72 hours.   Telemetry    NSR with NSVT and PVCs, no further sustained VT at time of exam. (personally reviewed)  Radiology    No results found.  Patient Profile     54 y.o. female admitted with VT. Etiology of her VT is most consistent with sarcoid given ability to suppress with steroids, echo appearance.   Assessment & Plan    1. VT storm, multiple foci Now back on mexiletine and on po steroids at 60 mg daily.  Continue coreg 6.25 mg BID as tolerated.  Methotrexate and folate as well.  Plan for PET [facility] as outpatient at [facility]. Paperwork submitted.    2. Chronic systolic CHF CHF team following.  Volume status stable and NYHA function stable.  Will monitor closely for s/s of low output with  recurrent VT.  Will continue current medications for now. No change.    3. Rt groin wound Appreciate wound care and ID input.  Continue linezolid to finish course. Day 8/8, so will discontinue after evening dose.    4. HTN Continue home meds.    5. Mood Pt on Effexor -> holding for risk of serotonin syndrome on Linezolid.   Last dose of linezolid this evening, so will be able to resume Effexor on discharge.   6. DM2 Appreciate diabetes coordinator assistance. Steroids tapered down to po yesterday. Continue to follow.  Continue metformin and Wilder Glade for now. SSI and following.   NOTE: PET scan CANNOT be performed on insulin, so would allow transient hyperglycemia as an outpt in order to not place on home insulin (if needed)  If VT remains quiescent potentially plan home tomorrow.  Per discussions with [facility], scheduling for PET scan is "about a month out".   For questions or updates, please contact [place] Please consult [email or web address] for contact info under Cardiology/STEMI.  Signed, Shirley Friar, PA-C  12/26/2019, 9:24 AM    ------------------------------------------------------------------------- I have seen, examined the patient, and reviewed the above assessment and plan.    Doing well this morning.  Still having some PVCs but no sustained ventricular tachycardia overnight.  We will continue prednisone and mexiletine with methotrexate.  As long as she remained stable for the next 24 hours, plan for discharge tomorrow morning with follow-up with Dr. Olin Pia clinic.   Vickie Epley, MD 12/26/2019 11:06 AM

## 2019-12-26 NOTE — TOC Benefit Eligibility Note (Signed)
Transition of Care [facility]) Benefit Eligibility Note    Patient Details  Name: Lisa Poole MRN: [id number] Date of Birth: 05-Apr-1966   Medication/Dose: Eliquis 44m. bid  30 day supply  Covered?: Yes  Tier: 3 Drug  Prescription Coverage Preferred Pharmacy: [facility] and [facility]  Spoke with Person/Company/Phone Number:: [employer] [employer]. @ [phone] Co-Pay: Zero  Prior Approval: No  Deductible: Met       HShelda AltesPhone Number: 12/26/2019, 11:58 AM

## 2019-12-26 NOTE — Progress Notes (Addendum)
Patient ID: Lisa Poole, female   DOB: [birth year], 54 y.o.   MRN: [id number]     Advanced Heart Failure Rounding Note  PCP-Cardiologist: No primary care provider on file.   Subjective:    Short runs of NSVT overnight, 3-4 beats. No sustained VT. VSS Mg 1.8  K 4.4   Wt stable at 170 lb.   Off solu-medrol, transitioned to Prednisone yesterday, current at 60 mg daily   No cardiac symptoms.   Rt groin wound nearly closed w/o any further visible drainage.   Objective:   Weight Range: 77.2 kg Body mass index is 33.24 kg/m.   Vital Signs:   Temp:  [98 F (36.7 C)-98.7 F (37.1 C)] 98.7 F (37.1 C) (08/17 0420) Pulse Rate:  [70-76] 76 (08/17 0949) Resp:  [16-20] 20 (08/17 0420) BP: (97-115)/(61-73) 98/64 (08/17 0420) SpO2:  [96 %-99 %] 96 % (08/16 1756) Weight:  [77.2 kg] 77.2 kg (08/17 0420) Last BM Date: 12/24/19  Weight change: Filed Weights   12/24/19 0500 12/25/19 0525 12/26/19 0420  Weight: 78.4 kg 77.6 kg 77.2 kg    Intake/Output:   Intake/Output Summary (Last 24 hours) at 12/26/2019 1043 Last data filed at 12/26/2019 0952 Gross per 24 hour  Intake 3 ml  Output --  Net 3 ml      Physical Exam    General:  Well appearing, moderately obese . No respiratory difficulty HEENT: normal Neck: supple. no JVD. Carotids 2+ bilat; no bruits. No lymphadenopathy or thyromegaly appreciated. Cor: PMI nondisplaced. Regular rate & rhythm. No rubs, gallops or murmurs. Lungs: clear, no wheezing  Abdomen: soft, nontender, nondistended. No hepatosplenomegaly. No bruits or masses. Good bowel sounds. Extremities: no cyanosis, clubbing, rash, edema, Rt groin wound nearly closed w/o any visible drainage Neuro: alert & oriented x 3, cranial nerves grossly intact. moves all 4 extremities w/o difficulty. Affect pleasant.  Telemetry   A-V-paced. 90s currently, brief NSVT  Labs    CBC Recent Labs    12/24/19 0925 12/25/19 0616  WBC 7.0 6.9  HGB 12.8 13.0  HCT 42.0 42.4   MCV 87.5 87.2  PLT 272 233   Basic Metabolic Panel Recent Labs    12/25/19 0616 12/26/19 0342  NA 138 137  K 3.8 4.4  CL 108 108  CO2 21* 22  GLUCOSE 210* 177*  BUN 29* 25*  CREATININE 0.83 0.76  CALCIUM 9.1 8.9  MG 2.1 1.8   Liver Function Tests No results for input(s): AST, ALT, ALKPHOS, BILITOT, PROT, ALBUMIN in the last 72 hours. No results for input(s): LIPASE, AMYLASE in the last 72 hours. Cardiac Enzymes No results for input(s): CKTOTAL, CKMB, CKMBINDEX, TROPONINI in the last 72 hours.  BNP: BNP (last 3 results) Recent Labs    11/26/19 2011  BNP 1,258.1*    ProBNP (last 3 results) No results for input(s): PROBNP in the last 8760 hours.   D-Dimer No results for input(s): DDIMER in the last 72 hours. Hemoglobin A1C No results for input(s): HGBA1C in the last 72 hours. Fasting Lipid Panel No results for input(s): CHOL, HDL, LDLCALC, TRIG, CHOLHDL, LDLDIRECT in the last 72 hours. Thyroid Function Tests No results for input(s): TSH, T4TOTAL, T3FREE, THYROIDAB in the last 72 hours.  Invalid input(s): FREET3  Other results:   Imaging    No results found.   Medications:     Scheduled Medications: . apixaban  10 mg Oral BID   Followed by  . [START ON 12/29/2019] apixaban  5 mg  Oral BID  . carvedilol  6.25 mg Oral BID WC  . cholecalciferol  1,000 Units Oral Daily  . dapagliflozin propanediol  10 mg Oral Daily  . digoxin  0.125 mg Oral Daily  . folic acid  1 mg Oral Daily  . gabapentin  600 mg Oral BID  . insulin aspart  0-15 Units Subcutaneous TID WC  . insulin aspart  0-5 Units Subcutaneous QHS  . linezolid  600 mg Oral BID  . loratadine  10 mg Oral Daily  . metFORMIN  500 mg Oral BID WC  . methotrexate  10 mg Oral Weekly  . mexiletine  250 mg Oral Q12H  . multivitamin with minerals  1 tablet Oral Daily  . pantoprazole  80 mg Oral Daily  . pravastatin  10 mg Oral q1800  . predniSONE  60 mg Oral Q breakfast  . sacubitril-valsartan  1  tablet Oral BID  . sodium chloride flush  10-40 mL Intracatheter Q12H  . sodium chloride flush  3 mL Intravenous Once  . sodium chloride flush  3 mL Intravenous Q12H  . spironolactone  25 mg Oral Daily    Infusions: . sodium chloride      PRN Medications: sodium chloride, acetaminophen, albuterol, fentaNYL (SUBLIMAZE) injection, HYDROcodone-acetaminophen, ondansetron (ZOFRAN) IV, ondansetron, sodium chloride flush, sodium chloride flush   Assessment/Plan   1. Chronic systolic CHF: Nonischemic cardiomyopathy. Echo last admission in 7/21 with EF newly decreased to 20-25% with septal-lateral dyssynchrony and septal severe hypokinesis, normal RV, moderate TR, moderate MR. Echo in 5/20 prior to PPM placement showed EF 60-65%. Possible causes of cardiomyopathy included cardiac sarcoidosis, chronic RV pacing, frequent NSVT/PVCs, ?CAD (has RFs), ?viral myocarditis (relatively recent COVID-19 infection). No ETOH/drugs. Developed shock overnight 11/28/19 w/ hypotension and narrow pulse pressure, w/ subsequent AKI, SCr 1.18>>1.85>>2.26. Inotropes/pressors limited by ventricular ectopy.  LHC/RHC 7/23 with no significant CAD, elevated filling pressures, and low cardiac output on norepinephrine. IABP placed. IABP removed 7/26. cMRI 7/27 showed patchy LGE in the basal septum, basal-mid inferoseptum/inferior wall, basal anterior wall, mid anterolateral wall. Cardiac MRI LGE pattern could be consistent with cardiac sarcoidosis (would explain earlier CHB). Alternatively, prior viral myocarditis (recent COVID-19). CT chest with ground glass lower lobes but no evidence for pulmonary sarcoidosis. She is now s/p device upgrade to MDT CRT-D. This admission, she is not volume overloaded on exam and has NYHA class II symptoms.  I think that her HF is fairly well-compensated, recurrent ventricular arrhythmias appears to be the main issue.  -She does not need a loop diuretic for now.  - Continue digoxin 0.125 daily.  Dig level ok - Continue spironolactone 25 mg daily  - Continue Entresto 49/51 mg bid (BP too soft to titrate) - Continue Coreg 6.25 mg bid.  BP too soft to titrate -Continuedapagliflozin 10 mg daily.  - Initially treated w/ pulse Solumedrol 1 g IV daily x 5 days for treatment of cardiac sarcoidosis. Transition back to prednisone 8/16, currently 60 mg daily w/ planned taper  - [facility] diagnosis. EP has dicussed w/ [facility] and  scheduling for PET scan is "about a month out".  - Cardiomyopathy likely worsened by frequent VT/PVCs. Now on Mexiletine to suppress.  2. Recurrent ventricular arrhythmias: She has had VT with multiple morphologies. Increased ventricular arrhythmias over 6 months. ?Underlying cause. No significant coronary disease. Concerned that she has cardiac sarcoidosis (would also explain CHB). Amiodarone stopped in the past due to  torsades and very long QTc on amiodarone. switched to lidocaine w/ good response. Now on Mexiletine. Recurrent ventricular arrhythmias since decreasing prednisone makes me worry that she does indeed have cardiac sarcoidosis.  - Continue Mexiletine 250 mg bid  - Continue Coreg 6.25 mg bid (BP too soft for titration) - Keep K > 4.0 Mg > 2.0.  - Continue prednisone 60 mg daily  - now on methotrexate  - She is now s/p ICD upgrade.  3. H/o CHB: Cause is uncertain. Cath in 5/20 prior to PPM showed no significant disease. Possible cardiac sarcoidosis by cMRI but no evidence for pulmonary sarcoidosis. Needs cardiac PET scan for further evaluation for sarcoidosis.  4. Right Groin Hematoma => wound infection: Hematoma developed 7/26 at IABP site after removal. She had a Femstop.  Hematoma resolved, but recently noted to have oozing at wound site.  MRSA grew from culture and linezolid was started.  CT abdomen/pelvis showed no abscess. Rt groin wound now nearly closed w/o any further visible  drainage. She has completed 10 day course of linezolid. BC NGTD  - Discussed w/ ID today. Ok to stop linezolid today. She has ID f/u on 8/23  5. Rt IJ DVT - likely from prior central line.   - Will need anticoagulation for 3 months. She is now on Eliquis, treatment dose.  Length of Stay: [address] PA-C  12/26/2019, 10:43 AM  Advanced Heart Failure Team Pager [phone] (M-F; 7a - 4p)  Please contact [facility] for night-coverage after hours (4p -7a ) and weekends on [email or web address]  Patient seen and examined with the above-signed Advanced Practice Provider and/or Housestaff. I personally reviewed laboratory data, imaging studies and relevant notes. I independently examined the patient and formulated the important aspects of the plan. I have edited the note to reflect any of my changes or salient points. I have personally discussed the plan with the patient and/or family.  Volume status looks good. Groin ok. Still with brief runs of NSVT. On po steroids. She is tearful and wants to go home   General:  Well appearing. No resp difficulty HEENT: normal Neck: supple. no JVD. Carotids 2+ bilat; no bruits. No lymphadenopathy or thryomegaly appreciated. Cor: PMI nondisplaced. Regular rate & rhythm. No rubs, gallops or murmurs. Lungs: clear Abdomen: soft, nontender, nondistended. No hepatosplenomegaly. No bruits or masses. Good bowel sounds. Extremities: no cyanosis, clubbing, rash, edema Neuro: alert & orientedx3, cranial nerves grossly intact. moves all 4 extremities w/o difficulty. Affect pleasant  Stable from HF standpoint. Still with some NSVT. EP managing. Hopefully she can go home soon. I spoke with EP personally.   Glori Bickers, MD  4:31 PM

## 2019-12-26 NOTE — Discharge Summary (Signed)
ELECTROPHYSIOLOGY DISCHARGE SUMMARY    Patient ID: Lisa Poole,  MRN: [id number], DOB/AGE: 1965/09/11 54 y.o.  Admit date: 12/19/2019 Discharge date: 12/27/19   Primary Care Physician: Milford Cage, PA  Primary Cardiologist: Dr. Haroldine Laws Electrophysiologist: Dr. Lovena Le  Primary Discharge Diagnosis:  VT storm with ATP Suspect cardiac sarcoidosis by cMRI  Secondary Discharge Diagnosis:  Chronic systolic CHF Rt groin wound HTN DMII    Brief HPI: [name] is a 54 y.o. female with h/o HTN, HLD, T2DM, CHB s/p PPM implant 02/2019 followed by Dr. Lovena Le -> Upgrade to BIV device 12/07/19, PAF (not on a/c), aortic insufficiency,recent COVID infection in April 124, systolic heart failure/ nonischemic CM, and VT on Mexitil (QT prolongation on amiodarone) admitted for VT storm by device interrogation  Hospital Course:  The patient was seen in device clinic for her wound check 8/10 with complaints of lightheadedness and dizziness. Device interrogation significant for "Histogram distribution suggests ~20% of ventricular HRs in 130-160bpm range. BiVP 74.0%, likely due to VT/PVCs. No mode switches. 6 FVT episodes and 2 VT episodes since 12/16/19, all treated with 1-2 bursts of ATP, some episodes ongoing below detection after ATP delivered. 214 monitored VT episodes, 90 NSVT episodes. Longest V sensing episode 2hr 1mn, markers suggest VT. Most recent treated episode this AM". Discussed with Dr. ARayann Hemanand Dr. TLovena Lewho recommended patient proceed to ER for further evaluation and treatment.   Pt previously had Prolonged QT and Torsades on amiodarone so this was not an option for treatment. This was thought to be secondary to her suspect cardiac sarcoidosis and likely a flare. She was started on IV steroids for 5 days of pulse dosing with eventual transition to po for chronic management. Methotrexate and folate were also added per Dr. KCaryl Comesper sarcoidosis literature regarding chronic  management.  Pts ATP zones were also adjusted with this VT being as slow as in the 130s. PT monitored through the weekend on pulse dose steroids and for several days on po. She had intermittent VT with successful ATP that appeared to stabilize prior to discharge. She was examined am of 12/27/19 and thought to be stable for discharge with close follow up as below.   Appreciate care of HF team, Wound care team, diabetes coordinator,  and Infectious disease team who were all consulted this admission. Pt groin wound stable prior to discharge, and she finished her linezolid for MRSA on wound culture.   [facility] Paperwork sent in this admission.   [facility] to [facility] with paper scripts provided as well for future refills.  Physical Exam: Vitals:   12/26/19 1355 12/26/19 1925 12/26/19 2135 12/27/19 0619  BP: 102/67  99/71 108/73  Pulse: 73 83 69 79  Resp: _0 Temp: 97.8 F (36.6 C)  97.8 F (36.6 C) 97.8 F (36.6 C)  TempSrc: Oral  Oral Oral  SpO2: 97% 96% 97% 100%  Weight:    79.1 kg  Height:        GEN- The patient is well appearing, alert and oriented x 3 today.   HEENT: normocephalic, atraumatic; sclera clear, conjunctiva pink; hearing intact; oropharynx clear; neck supple  Lungs- Clear to ausculation bilaterally, normal work of breathing.  No wheezes, rales, rhonchi Heart- Regular rate and rhythm, no murmurs, rubs or gallops  GI- soft, non-tender, non-distended, bowel sounds present  Extremities- no clubbing, cyanosis, or edema; DP/PT/radial pulses 2+ bilaterally, groin without hematoma/bruit MS-  no significant deformity or atrophy Skin- warm and dry, no rash or lesion Psych- euthymic mood, full affect Neuro- strength and sensation are intact   Labs:   Lab Results  Component Value Date   WBC 6.9 12/25/2019   HGB 13.0 12/25/2019   HCT 42.4 12/25/2019   MCV 87.2 12/25/2019   PLT 233 12/25/2019    Recent Labs  Lab  12/26/19 0342  NA 137  K 4.4  CL 108  CO2 22  BUN 25*  CREATININE 0.76  CALCIUM 8.9  GLUCOSE 177*     Discharge Medications:  Allergies as of 12/27/2019      Reactions   Lisinopril Other (See Comments), Cough   Flu like symptoms, also   Ace Inhibitors Other (See Comments)   Flu-like symptoms   Augmentin [amoxicillin-pot Clavulanate] Nausea And Vomiting   Benazepril Other (See Comments)   Flu like symptoms   Oxycodone-acetaminophen Nausea And Vomiting   Sulfa Antibiotics Nausea And Vomiting      Medication List    STOP taking these medications   aspirin EC 81 MG tablet   Effexor XR 150 MG 24 hr capsule Generic drug: venlafaxine XR   linezolid 600 MG tablet Commonly known as: ZYVOX   meloxicam 15 MG tablet Commonly known as: MOBIC     TAKE these medications   Accu-Chek Aviva Plus test strip Generic drug: glucose blood 3 (three) times daily.   acetaminophen 325 MG tablet Commonly known as: TYLENOL Take 2 tablets (650 mg total) by mouth every 4 (four) hours as needed for headache or mild pain.   albuterol 108 (90 Base) MCG/ACT inhaler Commonly known as: VENTOLIN HFA Inhale 1-2 puffs into the lungs every 6 (six) hours as needed for wheezing or shortness of breath.   apixaban 5 MG Tabs tablet Commonly known as: ELIQUIS Take 2 tablets (10 mg total) for 5 doses, then 5 mg BID.   Biotin 10 MG Caps Take 10 mg by mouth daily.   carvedilol 6.25 MG tablet Commonly known as: COREG Take 1 tablet (6.25 mg total) by mouth 2 (two) times daily with a meal.   Centrum Silver 50+Women Tabs Take 1 tablet by mouth daily.   cetirizine 10 MG tablet Commonly known as: ZYRTEC Take 10 mg by mouth daily.   dapagliflozin propanediol 10 MG Tabs tablet Commonly known as: FARXIGA Take 1 tablet (10 mg total) by mouth daily.   digoxin 0.125 MG tablet Commonly known as: LANOXIN Take 1 tablet (0.125 mg total) by mouth daily.   folic acid 1 MG tablet Commonly known as:  FOLVITE Take 1 tablet (1 mg total) by mouth daily.   gabapentin 600 MG tablet Commonly known as: NEURONTIN Take 600 mg by mouth 2 (two) times daily.   HYDROcodone-acetaminophen 10-325 MG tablet Commonly known as: NORCO Take 1 tablet by mouth 4 (four) times daily.   metFORMIN 500 MG tablet Commonly known as: GLUCOPHAGE Take 500 mg by mouth 2 (two) times daily.   methotrexate 10 MG tablet Commonly known as: RHEUMATREX Take 1 tablet (10 mg total) by mouth once a week. Caution:Chemotherapy. Protect from light.   mexiletine 250 MG capsule Commonly known as: MEXITIL Take 1 capsule (250 mg total) by mouth every 12 (twelve) hours. What changed:   medication strength  how much to take   Narcan 4 MG/0.1ML Liqd nasal spray kit Generic drug: naloxone Place 1 spray into the nose as directed.   omeprazole 40 MG capsule Commonly known as: PRILOSEC Take 40  mg by mouth daily.   ondansetron 4 MG disintegrating tablet Commonly known as: Zofran ODT Take 1 tablet (4 mg total) by mouth every 8 (eight) hours as needed for nausea or vomiting.   pravastatin 10 MG tablet Commonly known as: PRAVACHOL Take 1 tablet (10 mg total) by mouth daily at 6 PM.   predniSONE 20 MG tablet Commonly known as: DELTASONE Take 3 tablets (60 mg total) by mouth daily with breakfast. What changed:   how much to take  Another medication with the same name was removed. Continue taking this medication, and follow the directions you see here.   sacubitril-valsartan 49-51 MG Commonly known as: ENTRESTO Take 1 tablet by mouth 2 (two) times daily.   spironolactone 25 MG tablet Commonly known as: ALDACTONE Take 1 tablet (25 mg total) by mouth daily.   Vitamin D-3 25 MCG (1000 UT) Caps Take 1,000 Units by mouth daily.       Disposition:    Follow-up Information    [facility], [facility] Follow up.   Specialty: [place] Why: A representative from [facility] will contact you to  arrange start date and time  Contact information: [place] [address] [phone]        Shirley Friar, PA-C Follow up on 01/08/2020.   Specialty: Physician Assistant Why: at 1120 for post hospital follow up. Contact information: [address] [address] [phone]        Michel Bickers, MD Follow up on 01/01/2020.   Specialty: Infectious Diseases Why: at 3 pm for wound follow up Contact information: [address] [place] [address] [phone]               Duration of Discharge Encounter: Greater than 30 minutes including physician time.  Jacalyn Lefevre, PA-C  12/27/2019 7:58 AM

## 2019-12-27 LAB — BASIC METABOLIC PANEL
Anion gap: 10 (ref 5–15)
BUN: 26 mg/dL — ABNORMAL HIGH (ref 6–20)
CO2: 27 mmol/L (ref 22–32)
Calcium: 9.2 mg/dL (ref 8.9–10.3)
Chloride: 98 mmol/L (ref 98–111)
Creatinine, Ser: 0.78 mg/dL (ref 0.44–1.00)
GFR calc Af Amer: >60 mL/min (ref >60)
GFR calc non Af Amer: >60 mL/min (ref >60)
Glucose, Bld: 153 mg/dL — ABNORMAL HIGH (ref 70–99)
Potassium: 4 mmol/L (ref 3.5–5.1)
Sodium: 135 mmol/L (ref 135–145)

## 2019-12-27 LAB — MAGNESIUM: Magnesium: 2 mg/dL (ref 1.7–2.4)

## 2019-12-27 LAB — GLUCOSE, CAPILLARY: Glucose-Capillary: 159 mg/dL — ABNORMAL HIGH (ref 70–99)

## 2019-12-27 MED ORDER — MEXILETINE HCL 250 MG PO CAPS: 250.0000 mg | ORAL_CAPSULE | Freq: Two times a day (BID) | ORAL | 6 refills | Status: DC

## 2019-12-27 MED ORDER — ACETAMINOPHEN 325 MG PO TABS: 650.0000 mg | ORAL_TABLET | ORAL | Status: AC | PRN

## 2019-12-27 MED ORDER — PREDNISONE 20 MG PO TABS: 60.0000 mg | ORAL_TABLET | Freq: Every day | ORAL | 3 refills | Status: DC

## 2019-12-27 MED ORDER — METHOTREXATE SODIUM 10 MG PO TABS: 10.0000 mg | ORAL_TABLET | ORAL | 0 refills | Status: DC

## 2019-12-27 MED ORDER — APIXABAN 5 MG PO TABS: ORAL_TABLET | ORAL | 3 refills | Status: DC

## 2019-12-27 MED ORDER — FOLIC ACID 1 MG PO TABS: 1.0000 mg | ORAL_TABLET | Freq: Every day | ORAL | 6 refills | Status: DC

## 2019-12-27 MED ORDER — FOLIC ACID 1 MG PO TABS: 1.0000 mg | ORAL_TABLET | Freq: Every day | ORAL | 6 refills | Status: AC

## 2019-12-27 MED ORDER — METHOTREXATE SODIUM 10 MG PO TABS: 10.0000 mg | ORAL_TABLET | ORAL | 3 refills | Status: DC

## 2019-12-27 MED FILL — ELIQUIS 5 MG TABLET: 5 | 30 days supply | Qty: 70 | Fill #0

## 2019-12-27 MED FILL — FOLIC ACID 1 MG TABS: 1 | 30 days supply | Qty: 30 | Fill #0

## 2019-12-27 MED FILL — predniSONE 20 MG TABS: 20 | 30 days supply | Qty: 90 | Fill #0

## 2019-12-27 MED FILL — MEXILETINE 250 MG CAPSULE: 250 | 30 days supply | Qty: 60 | Fill #0

## 2019-12-27 MED FILL — METHOTREXATE 2.5 MG TABS: 2.5 | 28 days supply | Qty: 16 | Fill #0

## 2019-12-27 NOTE — Progress Notes (Signed)
All discharge information provided to patient at bedside. Cardiac monitor d/c and IV removed. Patient waiting on Transition of Care to provide prescription medication before to discharge. No further question ask at this time.

## 2019-12-28 ENCOUNTER — Encounter: Payer: Medicare Other | Admitting: Physician Assistant

## 2019-12-29 ENCOUNTER — Telehealth: Payer: Self-pay

## 2019-12-29 NOTE — Telephone Encounter (Signed)
COVID-19 Pre-Screening Questions:12/29/19  Do you currently have a fever (>100 F), chills or unexplained body aches?NO  Are you currently experiencing new cough, shortness of breath, sore throat, runny nose? NO  Have you been in contact with someone that is currently pending confirmation of Covid19 testing or has been confirmed to have the [place] virus? NO    **If the patient answers NO to ALL questions -  advise the patient to please call the clinic before coming to the office should any symptoms develop.

## 2020-01-01 ENCOUNTER — Ambulatory Visit: Payer: Medicare Other | Admitting: Internal Medicine

## 2020-01-03 ENCOUNTER — Telehealth: Payer: Self-pay

## 2020-01-03 NOTE — Telephone Encounter (Signed)
Received confirmation fax from [facility] for requested PET scan.  Per fax- Pt is scheduled for PET on 01/26/2020 at 11:45 am.  [facility] will be calling 3 days in advance for preop instructions but initial call must be made by your office.   Pt sees AT on 01/08/20 and he will advise of test at that time.

## 2020-01-05 ENCOUNTER — Telehealth: Payer: Self-pay

## 2020-01-05 NOTE — Telephone Encounter (Signed)
Carelink alert received 01/04/20 for 6 episodes in the slow VT zone treated with multiple rounds of ATP. One episode resulted in a 20 J shock following 7 rounds of ATP; a type II break was noted after the defibrillation.   Called patients husband Halla Chopp asking to speak with his wife.   Patient reports of she was cleaning her nightstand yesterday and felt the shock. Patient was asymptomatic prior and after. Patient states she has been feeling better and had no complaints. Patient reports of compliance with all medications including carvedilol 6.25 mg BID, digoxin .0125 mg daily, mexiletine 250 mg q 12 hours.   Shock plan reviewed with patient. [place] DMV driving restrictions informed and has a new start date of 01/04/20. Patient verbalizes understanding.   Consulted Dr. Rayann Heman in office for review, states he would like me to call Dr. Lovena Le to discuss. Attempted to call Dr. Lovena Le, he is unavailable at this time. Text message sent for Dr. Lovena Le to call me to discuss a patient.

## 2020-01-05 NOTE — Telephone Encounter (Signed)
Consulted to Dr. Lovena Le, he request patient come into office to have VT zone therapies adjusted. Patient called apt made for 01/09/20 @ 9:00 am. Canceled Monday's apt. With A. Tillery, PA. Informed patient.  Advised patient to call device clinic if she has any further questions or concerns, verbalizes understanding.

## 2020-01-08 ENCOUNTER — Encounter: Payer: Medicare Other | Admitting: Student

## 2020-01-09 ENCOUNTER — Ambulatory Visit (INDEPENDENT_AMBULATORY_CARE_PROVIDER_SITE_OTHER): Payer: Medicare Other | Admitting: Internal Medicine

## 2020-01-09 ENCOUNTER — Encounter: Payer: Self-pay | Admitting: Internal Medicine

## 2020-01-09 ENCOUNTER — Other Ambulatory Visit: Payer: Self-pay

## 2020-01-09 VITALS — BP 90/62 | HR 117 | Ht 60.0 in | Wt 170.2 lb

## 2020-01-09 DIAGNOSIS — Z95810 Presence of automatic (implantable) cardiac defibrillator: Secondary | ICD-10-CM | POA: Diagnosis not present

## 2020-01-09 DIAGNOSIS — I442 Atrioventricular block, complete: Secondary | ICD-10-CM

## 2020-01-09 DIAGNOSIS — I472 Ventricular tachycardia, unspecified: Secondary | ICD-10-CM

## 2020-01-09 DIAGNOSIS — I5022 Chronic systolic (congestive) heart failure: Secondary | ICD-10-CM

## 2020-01-09 LAB — CUP PACEART INCLINIC DEVICE CHECK
Battery Remaining Longevity: 90 mo
Battery Voltage: 3.03 V
Brady Statistic AP VP Percent: 22.39 %
Brady Statistic AP VS Percent: 0.19 %
Brady Statistic AS VP Percent: 75.85 %
Brady Statistic AS VS Percent: 1.56 %
Brady Statistic RA Percent Paced: 21.77 %
Brady Statistic RV Percent Paced: 95.45 %
Date Time Interrogation Session: 20210831102652
HighPow Impedance: 72 Ohm
Implantable Lead Implant Date: 20201005
Implantable Lead Implant Date: 20201005
Implantable Lead Implant Date: 20210729
Implantable Lead Location: 753859
Implantable Lead Location: 753860
Implantable Lead Location: 753860
Implantable Lead Model: 3830
Implantable Lead Model: 5076
Implantable Lead Model: 6935
Implantable Pulse Generator Implant Date: 20210729
Lead Channel Impedance Value: 228 Ohm
Lead Channel Impedance Value: 285 Ohm
Lead Channel Impedance Value: 361 Ohm
Lead Channel Impedance Value: 418 Ohm
Lead Channel Impedance Value: 532 Ohm
Lead Channel Impedance Value: 646 Ohm
Lead Channel Pacing Threshold Amplitude: 0.5 V
Lead Channel Pacing Threshold Amplitude: 0.625 V
Lead Channel Pacing Threshold Amplitude: 0.875 V
Lead Channel Pacing Threshold Pulse Width: 0.4 ms
Lead Channel Pacing Threshold Pulse Width: 0.4 ms
Lead Channel Pacing Threshold Pulse Width: 0.4 ms
Lead Channel Sensing Intrinsic Amplitude: 2.75 mV
Lead Channel Sensing Intrinsic Amplitude: 6.375 mV
Lead Channel Setting Pacing Amplitude: 1.5 V
Lead Channel Setting Pacing Amplitude: 2.5 V
Lead Channel Setting Pacing Amplitude: 3.5 V
Lead Channel Setting Pacing Pulse Width: 0.4 ms
Lead Channel Setting Pacing Pulse Width: 0.4 ms
Lead Channel Setting Sensing Sensitivity: 0.3 mV

## 2020-01-09 MED ORDER — DIGOXIN 125 MCG PO TABS: 0.1250 mg | ORAL_TABLET | Freq: Every day | ORAL | 0 refills | Status: DC

## 2020-01-09 MED ORDER — MEXILETINE HCL 250 MG PO CAPS: 250.0000 mg | ORAL_CAPSULE | Freq: Two times a day (BID) | ORAL | 0 refills | Status: DC

## 2020-01-09 MED ORDER — AMIODARONE HCL 200 MG PO TABS
ORAL_TABLET | ORAL | 3 refills | Status: DC
Start: 2020-01-09 — End: 2020-02-09

## 2020-01-09 NOTE — Patient Instructions (Addendum)
Medication Instructions:  Your physician has recommended you make the following change in your medication:   1.  START taking amiodarone 200 mg tablets-  A.  You will START by taking one tablet by mouth TWICE a day for 7 days  B.  After 7 days-reduce your amiodarone to ONE tablet by mouth ONCE a day  2.  STOP your DIGOXIN in 7 days  3.  STOP your MEXILETINE in 7 days  Labwork: None ordered.  Testing/Procedures:  You are scheduled for a PET scan at [facility] on 01/26/2020 at 11:45 am. [facility] will be calling 3 days in advance for preop instructions.  Follow-Up:  You will need a nurse visit in 2 weeks for an EKG- January 23, 2020 at 12:15 pm at the [facility] office  Your physician wants you to follow-up in: 6 weeks with Dr. Lovena Le. February 27, 2020 at 12:15 pm at the [facility] office     Remote monitoring is used to monitor your ICD from home. This monitoring reduces the number of office visits required to check your device to one time per year. It allows [place] to keep an eye on the functioning of your device to ensure it is working properly. You are scheduled for a device check from home on 03/08/2020. You may send your transmission at any time that day. If you have a wireless device, the transmission will be sent automatically. After your physician reviews your transmission, you will receive a postcard with your next transmission date.  Any Other Special Instructions Will Be Listed Below (If Applicable).  If you need a refill on your cardiac medications before your next appointment, please call your pharmacy.

## 2020-01-09 NOTE — Progress Notes (Signed)
HPI Lisa Poole returns today for followup of her VT associated with sarcoidosis. She was admitted almost a year ago with CHB and underwent PPM. She then presented with CHF and VT 2 months ago and underwent upgrade to a biv ICD with her His bundle lead in the LV port. She has continued to have VT. She developed QT prolongation on amiodarone. She has received multiple ICD shocks. Review of her ICD interrogation demonstrates mostly successful ATP with a single episode that accelerated into the fast VT zone and lead to a shock. She is pending a PET scan to evaluate the extent of her cardiac sarcoid.  Allergies  Allergen Reactions  . Lisinopril Other (See Comments) and Cough    Flu like symptoms, also   . Ace Inhibitors Other (See Comments)    Flu-like symptoms  . Augmentin [Amoxicillin-Pot Clavulanate] Nausea And Vomiting  . Benazepril Other (See Comments)    Flu like symptoms  . Oxycodone-Acetaminophen Nausea And Vomiting  . Sulfa Antibiotics Nausea And Vomiting     Current Outpatient Medications  Medication Sig Dispense Refill  . ACCU-CHEK AVIVA PLUS test strip 3 (three) times daily.    Marland Kitchen acetaminophen (TYLENOL) 325 MG tablet Take 2 tablets (650 mg total) by mouth every 4 (four) hours as needed for headache or mild pain.    Marland Kitchen albuterol (PROVENTIL HFA;VENTOLIN HFA) 108 (90 Base) MCG/ACT inhaler Inhale 1-2 puffs into the lungs every 6 (six) hours as needed for wheezing or shortness of breath. 1 Inhaler 0  . apixaban (ELIQUIS) 5 MG TABS tablet Take 2 tablets (10 mg) twice daily for 5 doses then 1 tablet (5 mg ) twice daily thereafter 60 tablet 3  . Biotin 10 MG CAPS Take 10 mg by mouth daily.    . carvedilol (COREG) 6.25 MG tablet Take 1 tablet (6.25 mg total) by mouth 2 (two) times daily with a meal. 60 tablet 1  . cetirizine (ZYRTEC) 10 MG tablet Take 10 mg by mouth daily.    . Cholecalciferol (VITAMIN D-3) 25 MCG (1000 UT) CAPS Take 1,000 Units by mouth daily.     . dapagliflozin  propanediol (FARXIGA) 10 MG TABS tablet Take 1 tablet (10 mg total) by mouth daily. 30 tablet 6  . digoxin (LANOXIN) 0.125 MG tablet Take 1 tablet (0.125 mg total) by mouth daily. 30 tablet 6  . folic acid (FOLVITE) 1 MG tablet Take 1 tablet (1 mg total) by mouth daily. 30 tablet 6  . gabapentin (NEURONTIN) 600 MG tablet Take 600 mg by mouth 2 (two) times daily.    Marland Kitchen HYDROcodone-acetaminophen (NORCO) 10-325 MG tablet Take 1 tablet by mouth 4 (four) times daily.     . metFORMIN (GLUCOPHAGE) 500 MG tablet Take 500 mg by mouth 2 (two) times daily.    . methotrexate (RHEUMATREX) 10 MG tablet Take 1 tablet (10 mg total) by mouth once a week. Caution:Chemotherapy. Protect from light. 4 tablet 3  . mexiletine (MEXITIL) 250 MG capsule Take 1 capsule (250 mg total) by mouth every 12 (twelve) hours. 60 capsule 6  . Multiple Vitamins-Minerals (CENTRUM SILVER 50+WOMEN) TABS Take 1 tablet by mouth daily.    . naloxone (NARCAN) 4 MG/0.1ML LIQD nasal spray kit Place 1 spray into the nose as directed.    Marland Kitchen omeprazole (PRILOSEC) 40 MG capsule Take 40 mg by mouth daily.    . ondansetron (ZOFRAN ODT) 4 MG disintegrating tablet Take 1 tablet (4 mg total) by mouth every 8 (eight)  hours as needed for nausea or vomiting. 20 tablet 0  . pravastatin (PRAVACHOL) 10 MG tablet Take 1 tablet (10 mg total) by mouth daily at 6 PM. 30 tablet 5  . predniSONE (DELTASONE) 20 MG tablet Take 3 tablets (60 mg total) by mouth daily with breakfast. 90 tablet 3  . sacubitril-valsartan (ENTRESTO) 49-51 MG Take 1 tablet by mouth 2 (two) times daily. 60 tablet 6  . spironolactone (ALDACTONE) 25 MG tablet Take 1 tablet (25 mg total) by mouth daily. 30 tablet 6   No current facility-administered medications for this visit.     Past Medical History:  Diagnosis Date  . Aortic insufficiency   . Asthma 09/13/2006  . Complete heart block ([place]) 02/11/2019  . Cystocele without uterine prolapse 12/12/2018  . Diabetes mellitus without complication  ([place])   . Elevated troponin level   . Hypercholesteremia   . Hypertension   . Primary osteoarthritis of left knee 12/10/2017    ROS:   All systems reviewed and negative except as noted in the HPI.   Past Surgical History:  Procedure Laterality Date  . APPENDECTOMY    . BIV ICD INSERTION CRT-D N/A 12/07/2019   Procedure: BIV ICD INSERTION CRT-D;  Surgeon: Evans Lance, MD;  Location: [place] [facility];  Service: Cardiovascular;  Laterality: N/A;  . IABP INSERTION N/A 12/01/2019   Procedure: IABP Insertion;  Surgeon: Larey Dresser, MD;  Location: [place] [facility];  Service: Cardiovascular;  Laterality: N/A;  . LEFT HEART CATH AND CORONARY ANGIOGRAPHY N/A 09/28/2018   Procedure: LEFT HEART CATH AND CORONARY ANGIOGRAPHY;  Surgeon: Troy Sine, MD;  Location: [place] [facility];  Service: Cardiovascular;  Laterality: N/A;  . PACEMAKER IMPLANT N/A 02/13/2019   Procedure: PACEMAKER IMPLANT;  Surgeon: Evans Lance, MD;  Location: [place] [facility];  Service: Cardiovascular;  Laterality: N/A;  . PACEMAKER IMPLANT    . RIGHT/LEFT HEART CATH AND CORONARY ANGIOGRAPHY N/A 12/01/2019   Procedure: RIGHT/LEFT HEART CATH AND CORONARY ANGIOGRAPHY;  Surgeon: Larey Dresser, MD;  Location: [place] [facility];  Service: Cardiovascular;  Laterality: N/A;  . ROTATOR CUFF REPAIR Bilateral 2017  . SINUS EXPLORATION    . TOTAL KNEE ARTHROPLASTY Left 12/10/2017   Procedure: LEFT TOTAL KNEE ARTHROPLASTY;  Surgeon: Sydnee Cabal, MD;  Location: [facility];  Service: Orthopedics;  Laterality: Left;  Adductor Block  . TUBAL LIGATION       Family History  Problem Relation Age of Onset  . Colon cancer Maternal Grandmother   . Alzheimer's disease Mother      Social History   Socioeconomic History  . Marital status: Married    Spouse name: Not on file  . Number of children: Not on file  . Years of education: Not on file  . Highest education level: Not on file  Occupational History  . Not on  file  Tobacco Use  . Smoking status: Never Smoker  . Smokeless tobacco: Never Used  Vaping Use  . Vaping Use: Never used  Substance and Sexual Activity  . Alcohol use: No  . Drug use: No  . Sexual activity: Yes    Birth control/protection: Post-menopausal  Other Topics Concern  . Not on file  Social History Narrative  . Not on file   Social Determinants of Health   Financial Resource Strain:   . Difficulty of Paying Living Expenses: Not on file  Food Insecurity:   . Worried About Charity fundraiser in  the Last Year: Not on file  . Ran Out of Food in the Last Year: Not on file  Transportation Needs:   . Lack of Transportation (Medical): Not on file  . Lack of Transportation (Non-Medical): Not on file  Physical Activity:   . Days of Exercise per Week: Not on file  . Minutes of Exercise per Session: Not on file  Stress:   . Feeling of Stress : Not on file  Social Connections:   . Frequency of Communication with Friends and Family: Not on file  . Frequency of Social Gatherings with Friends and Family: Not on file  . Attends Religious Services: Not on file  . Active Member of Clubs or Organizations: Not on file  . Attends Archivist Meetings: Not on file  . Marital Status: Not on file  Intimate Partner Violence:   . Fear of Current or Ex-Partner: Not on file  . Emotionally Abused: Not on file  . Physically Abused: Not on file  . Sexually Abused: Not on file     BP 90/62   Pulse (!) 117   Ht 5' (1.524 m)   Wt 170 lb 3.2 oz (77.2 kg)   LMP 07/25/2015 (Exact Date)   SpO2 94%   BMI 33.24 kg/m   Physical Exam:  Well appearing NAD HEENT: Unremarkable Neck:  No JVD, no thyromegally Lymphatics:  No adenopathy Back:  No CVA tenderness Lungs:  Clear with no wheezes HEART:  Regular rate rhythm, no murmurs, no rubs, no clicks Abd:  soft, positive bowel sounds, no organomegally, no rebound, no guarding Ext:  2 plus pulses, no edema, no cyanosis, no  clubbing Skin:  No rashes no nodules Neuro:  CN II through XII intact, motor grossly intact  EKG - ST at 115  DEVICE  Normal device function.  See PaceArt for details.   Assess/Plan: 1. VT - she is not well controlled. When she was sick and in the hospital she had some QRS widening on amio. I would like to rechallenge her with a restart of the amio and then stopping of the mexitil 2. Nausea - I suspect that this is due to mexitil. Hopefully we can stop this in a week. 3. Sarcoidosis - she has evidence of cardiac sarcoid on MRI. She is pending PET. She is on prednisone and methotrexate. 4. Chronic systolic heart failure - her symptoms are class 2. She will continue Entresto. 5. ICD - today we reprogrammed her VT one zone to deliver both burst and ramp.  Salome Spotted.

## 2020-01-10 ENCOUNTER — Telehealth: Payer: Self-pay | Admitting: Internal Medicine

## 2020-01-10 NOTE — Telephone Encounter (Signed)
Returned call to Pt.  Reiterated instructions from OV yesterday-explained why she needed to continue the digoxin and mexiletine for 7 days along with amiodarone.  Pt expressed understanding.

## 2020-01-10 NOTE — Telephone Encounter (Signed)
    Pt c/o medication issue:  1. Name of Medication:   amiodarone (PACERONE) 200 MG tablet    digoxin (LANOXIN) 0.125 MG tablet    2. How are you currently taking this medication (dosage and times per day)?   3. Are you having a reaction (difficulty breathing--STAT)?   4. What is your medication issue? Pt has question about these medications, she wasn't sure if she needs to stop one taking one of them

## 2020-01-11 ENCOUNTER — Ambulatory Visit (INDEPENDENT_AMBULATORY_CARE_PROVIDER_SITE_OTHER): Payer: Medicare Other | Admitting: Internal Medicine

## 2020-01-11 ENCOUNTER — Encounter: Payer: Self-pay | Admitting: Internal Medicine

## 2020-01-11 ENCOUNTER — Encounter (HOSPITAL_COMMUNITY): Payer: Self-pay

## 2020-01-11 ENCOUNTER — Other Ambulatory Visit: Payer: Self-pay

## 2020-01-11 ENCOUNTER — Inpatient Hospital Stay (HOSPITAL_COMMUNITY): Admission: RE | Admit: 2020-01-11 | Payer: Medicare Other | Source: Ambulatory Visit

## 2020-01-11 DIAGNOSIS — R109 Unspecified abdominal pain: Secondary | ICD-10-CM

## 2020-01-11 DIAGNOSIS — T8149XA Infection following a procedure, other surgical site, initial encounter: Secondary | ICD-10-CM | POA: Diagnosis not present

## 2020-01-11 DIAGNOSIS — B370 Candidal stomatitis: Secondary | ICD-10-CM

## 2020-01-11 MED ORDER — FLUCONAZOLE 100 MG PO TABS: 100.0000 mg | ORAL_TABLET | Freq: Every day | ORAL | 0 refills | Status: DC

## 2020-01-11 NOTE — Assessment & Plan Note (Signed)
She may have developed thrush as a result of her recent antibiotics, steroids and methotrexate.  I will treat her with a 7-day course of fluconazole.

## 2020-01-11 NOTE — Assessment & Plan Note (Signed)
Her left flank lesion is very nonspecific.  That lesion looks entirely different than the 3 small lesions on her right leg.  She is improving so I told her to simply finish out her acyclovir and doxycycline.

## 2020-01-11 NOTE — Assessment & Plan Note (Signed)
Her right groin wound infection has resolved.  She may restart her Effexor.

## 2020-01-11 NOTE — Progress Notes (Signed)
[facility] for Infectious Disease  Patient Active Problem List   Diagnosis Date Noted  . Acute left flank pain 01/11/2020    Priority: High  . Thrush 01/11/2020    Priority: High  . Wound infection after surgery 12/19/2019    Priority: High  . ICD (implantable cardioverter-defibrillator) in place 01/09/2020  . Ventricular tachycardia ([place]) 12/19/2019  . Chronic systolic heart failure ([place])   . Shortness of breath   . Acute decompensated heart failure ([place]) 11/26/2019  . Uterovaginal prolapse, incomplete 02/15/2019  . Complete heart block ([place]) 02/11/2019  . Cystocele without uterine prolapse 12/12/2018  . Elevated troponin level   . Chest discomfort   . NSTEMI (non-ST elevated myocardial infarction) ([place]) 09/27/2018  . Primary osteoarthritis of left knee 12/10/2017  . S/P knee replacement 12/10/2017  . Pain in left knee 10/20/2017  . Tear of right rotator cuff 05/20/2017  . Acromioclavicular arthrosis 08/01/2013  . Aortic insufficiency   . Dry eye syndrome 11/25/2007  . Asthma 09/13/2006  . Diabetes mellitus ([place]) 09/13/2006  . HTN (hypertension) 09/13/2006    Patient's Medications  New Prescriptions   FLUCONAZOLE (DIFLUCAN) 100 MG TABLET    Take 1 tablet (100 mg total) by mouth daily.  Previous Medications   ACCU-CHEK AVIVA PLUS TEST STRIP    3 (three) times daily.   ACETAMINOPHEN (TYLENOL) 325 MG TABLET    Take 2 tablets (650 mg total) by mouth every 4 (four) hours as needed for headache or mild pain.   ACYCLOVIR (ZOVIRAX) 800 MG TABLET    Take 800 mg by mouth 5 (five) times daily.   ALBUTEROL (PROVENTIL HFA;VENTOLIN HFA) 108 (90 BASE) MCG/ACT INHALER    Inhale 1-2 puffs into the lungs every 6 (six) hours as needed for wheezing or shortness of breath.   AMIODARONE (PACERONE) 200 MG TABLET    Take 2 tablets by mouth for 7 days.  Then reduce to 1 tablet by mouth daily thereafter.   APIXABAN (ELIQUIS) 5 MG TABS TABLET    Take 2 tablets (10 mg) twice daily  for 5 doses then 1 tablet (5 mg ) twice daily thereafter   BIOTIN 10 MG CAPS    Take 10 mg by mouth daily.   CARVEDILOL (COREG) 6.25 MG TABLET    Take 1 tablet (6.25 mg total) by mouth 2 (two) times daily with a meal.   CETIRIZINE (ZYRTEC) 10 MG TABLET    Take 10 mg by mouth daily.   CHOLECALCIFEROL (VITAMIN D-3) 25 MCG (1000 UT) CAPS    Take 1,000 Units by mouth daily.    DAPAGLIFLOZIN PROPANEDIOL (FARXIGA) 10 MG TABS TABLET    Take 1 tablet (10 mg total) by mouth daily.   DIGOXIN (LANOXIN) 0.125 MG TABLET    Take 1 tablet (0.125 mg total) by mouth daily for 7 days.   DOXYCYCLINE (VIBRA-TABS) 100 MG TABLET    Take 100 mg by mouth 2 (two) times daily.   FOLIC ACID (FOLVITE) 1 MG TABLET    Take 1 tablet (1 mg total) by mouth daily.   GABAPENTIN (NEURONTIN) 600 MG TABLET    Take 600 mg by mouth 2 (two) times daily.   HYDROCODONE-ACETAMINOPHEN (NORCO) 10-325 MG TABLET    Take 1 tablet by mouth 4 (four) times daily.    METFORMIN (GLUCOPHAGE) 500 MG TABLET    Take 500 mg by mouth 2 (two) times daily.   METHOTREXATE (RHEUMATREX) 10 MG TABLET    Take  1 tablet (10 mg total) by mouth once a week. Caution:Chemotherapy. Protect from light.   MEXILETINE (MEXITIL) 250 MG CAPSULE    Take 1 capsule (250 mg total) by mouth every 12 (twelve) hours for 7 days.   MULTIPLE VITAMINS-MINERALS (CENTRUM SILVER 50+WOMEN) TABS    Take 1 tablet by mouth daily.   NALOXONE (NARCAN) 4 MG/0.1ML LIQD NASAL SPRAY KIT    Place 1 spray into the nose as directed.   OMEPRAZOLE (PRILOSEC) 40 MG CAPSULE    Take 40 mg by mouth daily.   ONDANSETRON (ZOFRAN ODT) 4 MG DISINTEGRATING TABLET    Take 1 tablet (4 mg total) by mouth every 8 (eight) hours as needed for nausea or vomiting.   PRAVASTATIN (PRAVACHOL) 10 MG TABLET    Take 1 tablet (10 mg total) by mouth daily at 6 PM.   PREDNISONE (DELTASONE) 20 MG TABLET    Take 3 tablets (60 mg total) by mouth daily with breakfast.   SACUBITRIL-VALSARTAN (ENTRESTO) 49-51 MG    Take 1 tablet by  mouth 2 (two) times daily.   SPIRONOLACTONE (ALDACTONE) 25 MG TABLET    Take 1 tablet (25 mg total) by mouth daily.  Modified Medications   No medications on file  Discontinued Medications   No medications on file    Subjective: Ms. Lynes is in for her hospital follow-up visit.  She developed complete heart block last year and required a permanent pacemaker.  She has a history of paroxysmal atrial fibrillation and aortic insufficiency.  She developed COVID-19 infection this spring.  She became acutely short of breath and was admitted to the hospital on 11/26/2019.  She was found to have an acute cardiomyopathy with heart failure.  She underwent cardiac catheterization and had intra-aortic balloon pump placed on 12/01/2019.  She was finally discharged on 12/08/2019.  When she followed up in heart failure clinic on 12/15/2019 she commented on some drainage from her right groin catheterization site.  Drainage was cultured and she was started on amoxicillin clavulanate.  There was concern that her cardiomyopathy was due to sarcoidosis.  She was started on steroids and methotrexate.  She developed prolonged QT and ventricular tachycardia leading to readmission on 12/19/2019.  The right groin drainage culture grew MRSA.  She was seen by my partners, Janene Madeira and a Carlyle Basques, who recommended continuing linezolid for 10 days.  While hospitalized her permanent pacemaker was changed to an AICD.  Her Effexor was held.  She completed linezolid on 12/29/2019.  She has had no further drainage, pain or swelling in her right groin.  4 days ago she developed pain, redness and swelling to left flank.  She did not have any trauma.  She was seen by her PCP who was concerned that she had developed shingles and started her on acyclovir and doxycycline.  Since that time she has developed 3 small lesions on her right leg.  Her left flank lesion is improving.  She is worried that she may have developed thrush.  She has  seen white patches on the inside of her cheeks tried brushing them off with her toothbrush.  Review of Systems: Review of Systems  Constitutional: Positive for malaise/fatigue. Negative for chills, diaphoresis and fever.  Respiratory: Negative for cough and shortness of breath.   Cardiovascular: Negative for chest pain.  Gastrointestinal: Negative for abdominal pain, diarrhea, nausea and vomiting.  Skin: Positive for rash. Negative for itching.    Past Medical History:  Diagnosis Date  . Aortic  insufficiency   . Asthma 09/13/2006  . Complete heart block ([place]) 02/11/2019  . Cystocele without uterine prolapse 12/12/2018  . Diabetes mellitus without complication ([place])   . Elevated troponin level   . Hypercholesteremia   . Hypertension   . Primary osteoarthritis of left knee 12/10/2017    Social History   Tobacco Use  . Smoking status: Never Smoker  . Smokeless tobacco: Never Used  Vaping Use  . Vaping Use: Never used  Substance Use Topics  . Alcohol use: No  . Drug use: No    Family History  Problem Relation Age of Onset  . Colon cancer Maternal Grandmother   . Alzheimer's disease Mother     Allergies  Allergen Reactions  . Lisinopril Other (See Comments) and Cough    Flu like symptoms, also   . Ace Inhibitors Other (See Comments)    Flu-like symptoms  . Augmentin [Amoxicillin-Pot Clavulanate] Nausea And Vomiting  . Benazepril Other (See Comments)    Flu like symptoms  . Oxycodone-Acetaminophen Nausea And Vomiting  . Sulfa Antibiotics Nausea And Vomiting    Objective: Vitals:   01/11/20 1346  BP: 103/70  Pulse: 68  Temp: 97.6 F (36.4 C)  TempSrc: Oral  SpO2: 95%  Weight: 170 lb (77.1 kg)  Height: 5' (1.524 m)   Body mass index is 33.2 kg/m.  Physical Exam Constitutional:      Comments: She seems a bit overwhelmed but is in good spirits.  HENT:     Mouth/Throat:     Comments: No exudates but scattered erythema mouth. Cardiovascular:     Rate and  Rhythm: Normal rate and regular rhythm.     Heart sounds: No murmur heard.      Comments: Her left anterior chest AICD site is healing nicely. Pulmonary:     Effort: Pulmonary effort is normal.     Breath sounds: Normal breath sounds.  Chest:    Abdominal:     Palpations: Abdomen is soft.     Tenderness: There is no abdominal tenderness.     Comments: He has an area of redness on her left lower abdomen laterally.  There are no vesicles.  Skin:    Findings: Rash present.     Comments: She has 3 tiny scabbed lesions on her right leg.  She has no redness, swelling or drainage in her right groin.  Neurological:     General: No focal deficit present.  Psychiatric:        Mood and Affect: Mood normal.     Lab Results    Problem List Items Addressed This Visit      High   Wound infection after surgery    Her right groin wound infection has resolved.  She may restart her Effexor.      Relevant Medications   acyclovir (ZOVIRAX) 800 MG tablet   fluconazole (DIFLUCAN) 100 MG tablet   Thrush    She may have developed thrush as a result of her recent antibiotics, steroids and methotrexate.  I will treat her with a 7-day course of fluconazole.      Relevant Medications   acyclovir (ZOVIRAX) 800 MG tablet   fluconazole (DIFLUCAN) 100 MG tablet   Acute left flank pain    Her left flank lesion is very nonspecific.  That lesion looks entirely different than the 3 small lesions on her right leg.  She is improving so I told her to simply finish out her acyclovir and doxycycline.  Relevant Medications   fluconazole (DIFLUCAN) 100 MG tablet       Michel Bickers, MD [facility] for Infectious [place] [phone] pager   [phone] cell 01/11/2020, 2:19 PM

## 2020-01-14 ENCOUNTER — Other Ambulatory Visit: Payer: Self-pay | Admitting: Internal Medicine

## 2020-01-17 ENCOUNTER — Telehealth: Payer: Self-pay | Admitting: Internal Medicine

## 2020-01-17 NOTE — Telephone Encounter (Signed)
Returned call to Pt.  Per Pt she received a denial letter from her insurance for the PET scan scheduled for 01/26/20.  Left VM for Lisa Poole with [facility] requesting call back to discuss.

## 2020-01-17 NOTE — Telephone Encounter (Signed)
New Message  Pt is calling and says she received a denial letter in the mail for a procedure  She would like to talk to Dr Forde Dandy nurse   Please call

## 2020-01-18 NOTE — Telephone Encounter (Signed)
Spoke with Ulice Dash with [facility]. He states precert was completed by [facility] and to follow up with our precert.  Spoke with our pre cert department.  A coding error was made.  Will resubmit to [employer].  Returned call to Pt and advised that should get approval with her insurance in time for her PET scan.  Pt thanked nurse for followup.

## 2020-01-22 ENCOUNTER — Other Ambulatory Visit (HOSPITAL_COMMUNITY): Payer: Self-pay | Admitting: Cardiology

## 2020-01-22 MED ORDER — METHOTREXATE SODIUM 10 MG PO TABS: 10.0000 mg | ORAL_TABLET | ORAL | 0 refills | Status: DC

## 2020-01-22 MED ORDER — DAPAGLIFLOZIN PROPANEDIOL 10 MG PO TABS: 10.0000 mg | ORAL_TABLET | Freq: Every day | ORAL | 1 refills | Status: DC

## 2020-01-22 MED ORDER — SACUBITRIL-VALSARTAN 49-51 MG PO TABS: 1.0000 | ORAL_TABLET | Freq: Two times a day (BID) | ORAL | 1 refills | Status: DC

## 2020-01-22 MED ORDER — SPIRONOLACTONE 25 MG PO TABS: 25.0000 mg | ORAL_TABLET | Freq: Every day | ORAL | 1 refills | Status: DC

## 2020-01-23 ENCOUNTER — Other Ambulatory Visit: Payer: Self-pay

## 2020-01-23 ENCOUNTER — Ambulatory Visit (INDEPENDENT_AMBULATORY_CARE_PROVIDER_SITE_OTHER): Payer: Medicare Other

## 2020-01-23 VITALS — HR 94

## 2020-01-23 DIAGNOSIS — I472 Ventricular tachycardia, unspecified: Secondary | ICD-10-CM

## 2020-01-23 NOTE — Patient Instructions (Signed)
Medication Instructions:  Your physician recommends that you continue on your current medications as directed. Please refer to the Current Medication list given to you today.  Labwork: None ordered.  Testing/Procedures: None ordered.  Follow-Up: Your physician wants you to follow-up in: as scheduled.    Any Other Special Instructions Will Be Listed Below (If Applicable).  If you need a refill on your cardiac medications before your next appointment, please call your pharmacy.   

## 2020-01-23 NOTE — Progress Notes (Signed)
1.) Reason for visit: Discontinued mexiletine and digoxin.  Started amiodarone  2.) Name of MD requesting visit: Cristopher Peru MD  3.) H&P: Pt with poorly controlled VT.  Was switched to amiodarone at last office visit.    4.) ROS related to problem: Pt states she is feeling "ok".  Still fatigued.  Pt has upcoming PET scan at [facility] 01/26/20.  5.) Assessment and plan per MD:  EKG ok.  Keep follow ups as scheduled.

## 2020-01-24 ENCOUNTER — Other Ambulatory Visit (HOSPITAL_COMMUNITY): Payer: Self-pay | Admitting: Cardiology

## 2020-01-25 ENCOUNTER — Other Ambulatory Visit (HOSPITAL_COMMUNITY): Payer: Self-pay | Admitting: *Deleted

## 2020-01-25 MED ORDER — PRAVASTATIN SODIUM 10 MG PO TABS: 10.0000 mg | ORAL_TABLET | Freq: Every day | ORAL | 5 refills | Status: DC

## 2020-01-26 ENCOUNTER — Other Ambulatory Visit (HOSPITAL_COMMUNITY): Payer: Self-pay | Admitting: *Deleted

## 2020-01-26 ENCOUNTER — Telehealth (HOSPITAL_COMMUNITY): Payer: Self-pay | Admitting: Pharmacy Technician

## 2020-01-26 MED ORDER — APIXABAN 5 MG PO TABS: 5.0000 mg | ORAL_TABLET | Freq: Two times a day (BID) | ORAL | 3 refills | Status: AC

## 2020-01-26 NOTE — Telephone Encounter (Signed)
Received a notification that the patient's insurance was requiring a PA on Eliquis. The pharmacy should just be billing one tablet twice a day. They are billing the loading doses from the first cycle of the patient's medication.  Asked to have a new script for Eliquis sent in with updated directions.  Charlann Boxer, CPhT

## 2020-01-29 ENCOUNTER — Telehealth (HOSPITAL_COMMUNITY): Payer: Self-pay | Admitting: *Deleted

## 2020-01-29 NOTE — Telephone Encounter (Signed)
Pt left VM wanting to know when she could return to work or does she need to contact Dr.Taylor for clearance.  Routed to [place] for advice

## 2020-01-29 NOTE — Telephone Encounter (Signed)
I would like to see her go about a month with no ICD shock then can return to work.

## 2020-01-30 ENCOUNTER — Telehealth (HOSPITAL_COMMUNITY): Payer: Self-pay

## 2020-01-30 MED ORDER — PREDNISONE 20 MG PO TABS: 40.0000 mg | ORAL_TABLET | Freq: Every day | ORAL | 3 refills | Status: DC

## 2020-01-30 NOTE — Telephone Encounter (Signed)
Created in error

## 2020-01-30 NOTE — Telephone Encounter (Signed)
Patient advised and verbalized understanding. Agreeable to appointment scheduled and med list updated

## 2020-01-30 NOTE — Telephone Encounter (Signed)
Left detailed VM.  

## 2020-02-05 ENCOUNTER — Inpatient Hospital Stay (HOSPITAL_COMMUNITY)
Admission: EM | Admit: 2020-02-05 | Discharge: 2020-02-09 | DRG: 264 | Disposition: A | Discharge status: Home / Self Care | Payer: Medicare Other | Attending: Cardiology | Admitting: Cardiology

## 2020-02-05 ENCOUNTER — Encounter (HOSPITAL_COMMUNITY): Payer: Self-pay

## 2020-02-05 DIAGNOSIS — Z7952 Long term (current) use of systemic steroids: Secondary | ICD-10-CM

## 2020-02-05 DIAGNOSIS — I472 Ventricular tachycardia, unspecified: Secondary | ICD-10-CM

## 2020-02-05 DIAGNOSIS — N179 Acute kidney failure, unspecified: Secondary | ICD-10-CM

## 2020-02-05 DIAGNOSIS — Z9109 Other allergy status, other than to drugs and biological substances: Secondary | ICD-10-CM

## 2020-02-05 DIAGNOSIS — D869 Sarcoidosis, unspecified: Secondary | ICD-10-CM

## 2020-02-05 DIAGNOSIS — Z79899 Other long term (current) drug therapy: Secondary | ICD-10-CM

## 2020-02-05 DIAGNOSIS — Z881 Allergy status to other antibiotic agents status: Secondary | ICD-10-CM

## 2020-02-05 DIAGNOSIS — I110 Hypertensive heart disease with heart failure: Secondary | ICD-10-CM | POA: Diagnosis present

## 2020-02-05 DIAGNOSIS — I428 Other cardiomyopathies: Secondary | ICD-10-CM | POA: Diagnosis present

## 2020-02-05 DIAGNOSIS — Z79891 Long term (current) use of opiate analgesic: Secondary | ICD-10-CM

## 2020-02-05 DIAGNOSIS — I442 Atrioventricular block, complete: Secondary | ICD-10-CM | POA: Diagnosis present

## 2020-02-05 DIAGNOSIS — Z8616 Personal history of COVID-19: Secondary | ICD-10-CM

## 2020-02-05 DIAGNOSIS — I5022 Chronic systolic (congestive) heart failure: Secondary | ICD-10-CM | POA: Diagnosis present

## 2020-02-05 DIAGNOSIS — Z885 Allergy status to narcotic agent status: Secondary | ICD-10-CM

## 2020-02-05 DIAGNOSIS — I2510 Atherosclerotic heart disease of native coronary artery without angina pectoris: Secondary | ICD-10-CM | POA: Diagnosis present

## 2020-02-05 DIAGNOSIS — Z4502 Encounter for adjustment and management of automatic implantable cardiac defibrillator: Secondary | ICD-10-CM

## 2020-02-05 DIAGNOSIS — E785 Hyperlipidemia, unspecified: Secondary | ICD-10-CM | POA: Diagnosis present

## 2020-02-05 DIAGNOSIS — Z7901 Long term (current) use of anticoagulants: Secondary | ICD-10-CM

## 2020-02-05 DIAGNOSIS — I480 Paroxysmal atrial fibrillation: Secondary | ICD-10-CM | POA: Diagnosis present

## 2020-02-05 DIAGNOSIS — E1165 Type 2 diabetes mellitus with hyperglycemia: Secondary | ICD-10-CM | POA: Diagnosis not present

## 2020-02-05 DIAGNOSIS — Z7984 Long term (current) use of oral hypoglycemic drugs: Secondary | ICD-10-CM

## 2020-02-05 DIAGNOSIS — Z20822 Contact with and (suspected) exposure to covid-19: Secondary | ICD-10-CM | POA: Diagnosis present

## 2020-02-05 DIAGNOSIS — I252 Old myocardial infarction: Secondary | ICD-10-CM

## 2020-02-05 DIAGNOSIS — Z882 Allergy status to sulfonamides status: Secondary | ICD-10-CM

## 2020-02-05 DIAGNOSIS — Z96652 Presence of left artificial knee joint: Secondary | ICD-10-CM | POA: Diagnosis present

## 2020-02-05 DIAGNOSIS — Z9889 Other specified postprocedural states: Secondary | ICD-10-CM

## 2020-02-05 DIAGNOSIS — Z23 Encounter for immunization: Secondary | ICD-10-CM

## 2020-02-05 DIAGNOSIS — T380X5A Adverse effect of glucocorticoids and synthetic analogues, initial encounter: Secondary | ICD-10-CM | POA: Diagnosis not present

## 2020-02-05 DIAGNOSIS — E871 Hypo-osmolality and hyponatremia: Secondary | ICD-10-CM

## 2020-02-05 DIAGNOSIS — Z95810 Presence of automatic (implantable) cardiac defibrillator: Secondary | ICD-10-CM

## 2020-02-05 DIAGNOSIS — D8685 Sarcoid myocarditis: Secondary | ICD-10-CM | POA: Diagnosis present

## 2020-02-05 DIAGNOSIS — Z794 Long term (current) use of insulin: Secondary | ICD-10-CM

## 2020-02-05 DIAGNOSIS — Z86718 Personal history of other venous thrombosis and embolism: Secondary | ICD-10-CM

## 2020-02-05 DIAGNOSIS — I4729 Other ventricular tachycardia: Secondary | ICD-10-CM

## 2020-02-05 LAB — CBC WITH DIFFERENTIAL/PLATELET
Abs Immature Granulocytes: 0.07 10*3/uL (ref 0.00–0.07)
Basophils Absolute: 0 10*3/uL (ref 0.0–0.1)
Basophils Relative: 0 %
Eosinophils Absolute: 0 10*3/uL (ref 0.0–0.5)
Eosinophils Relative: 0 %
HCT: 47.4 % — ABNORMAL HIGH (ref 36.0–46.0)
Hemoglobin: 14.7 g/dL (ref 12.0–15.0)
Immature Granulocytes: 1 %
Lymphocytes Relative: 7 %
Lymphs Abs: 0.6 10*3/uL — ABNORMAL LOW (ref 0.7–4.0)
MCH: 27.9 pg (ref 26.0–34.0)
MCHC: 31 g/dL (ref 30.0–36.0)
MCV: 89.9 fL (ref 80.0–100.0)
Monocytes Absolute: 0.3 10*3/uL (ref 0.1–1.0)
Monocytes Relative: 4 %
Neutro Abs: 7 10*3/uL (ref 1.7–7.7)
Neutrophils Relative %: 88 %
Platelets: 257 10*3/uL (ref 150–400)
RBC: 5.27 MIL/uL — ABNORMAL HIGH (ref 3.87–5.11)
RDW: 21.7 % — ABNORMAL HIGH (ref 11.5–15.5)
WBC: 8 10*3/uL (ref 4.0–10.5)
nRBC: 0 % (ref 0.0–0.2)

## 2020-02-05 LAB — BASIC METABOLIC PANEL
Anion gap: 15 (ref 5–15)
BUN: 18 mg/dL (ref 6–20)
CO2: 22 mmol/L (ref 22–32)
Calcium: 9.8 mg/dL (ref 8.9–10.3)
Chloride: 97 mmol/L — ABNORMAL LOW (ref 98–111)
Creatinine, Ser: 1.34 mg/dL — ABNORMAL HIGH (ref 0.44–1.00)
GFR calc Af Amer: 52 mL/min — ABNORMAL LOW (ref >60)
GFR calc non Af Amer: 44 mL/min — ABNORMAL LOW (ref >60)
Glucose, Bld: 446 mg/dL — ABNORMAL HIGH (ref 70–99)
Potassium: 4.9 mmol/L (ref 3.5–5.1)
Sodium: 134 mmol/L — ABNORMAL LOW (ref 135–145)

## 2020-02-05 LAB — MAGNESIUM: Magnesium: 1.9 mg/dL (ref 1.7–2.4)

## 2020-02-05 NOTE — ED Provider Notes (Signed)
[facility] EMERGENCY DEPARTMENT Provider Note   CSN: [id number] Arrival date & time: 02/05/20  2254   History Chief Complaint  Patient presents with  . Weakness    Lisa Poole is a 54 y.o. female.  The history is provided by the patient.  Weakness She has history of hypertension, diabetes, hyperlipidemia, coronary artery disease, chronic systolic heart failure, status post ICD placement and comes in because her ICD fired twice tonight.  She had no prodromal symptoms.  She states that she has felt generally weak, but that has been constant for her for quite some time.  She denies feeling dizzy or lightheaded prior to it firing.  She denies any sense that her heart was racing.  Her ICD had fired in the past, but never twice in one evening.  Past Medical History:  Diagnosis Date  . Aortic insufficiency   . Asthma 09/13/2006  . Complete heart block ([place]) 02/11/2019  . Cystocele without uterine prolapse 12/12/2018  . Diabetes mellitus without complication ([place])   . Elevated troponin level   . Hypercholesteremia   . Hypertension   . Primary osteoarthritis of left knee 12/10/2017    Patient Active Problem List   Diagnosis Date Noted  . Acute left flank pain 01/11/2020  . Thrush 01/11/2020  . ICD (implantable cardioverter-defibrillator) in place 01/09/2020  . Ventricular tachycardia ([place]) 12/19/2019  . Wound infection after surgery 12/19/2019  . Chronic systolic heart failure ([place])   . Shortness of breath   . Acute decompensated heart failure ([place]) 11/26/2019  . Uterovaginal prolapse, incomplete 02/15/2019  . Complete heart block ([place]) 02/11/2019  . Cystocele without uterine prolapse 12/12/2018  . Elevated troponin level   . Chest discomfort   . NSTEMI (non-ST elevated myocardial infarction) ([place]) 09/27/2018  . Primary osteoarthritis of left knee 12/10/2017  . S/P knee replacement 12/10/2017  . Pain in left knee 10/20/2017  . Tear of right rotator cuff  05/20/2017  . Acromioclavicular arthrosis 08/01/2013  . Aortic insufficiency   . Dry eye syndrome 11/25/2007  . Asthma 09/13/2006  . Diabetes mellitus ([place]) 09/13/2006  . HTN (hypertension) 09/13/2006    Past Surgical History:  Procedure Laterality Date  . APPENDECTOMY    . BIV ICD INSERTION CRT-D N/A 12/07/2019   Procedure: BIV ICD INSERTION CRT-D;  Surgeon: Evans Lance, MD;  Location: [facility];  Service: Cardiovascular;  Laterality: N/A;  . IABP INSERTION N/A 12/01/2019   Procedure: IABP Insertion;  Surgeon: Larey Dresser, MD;  Location: [place];  Service: Cardiovascular;  Laterality: N/A;  . LEFT HEART CATH AND CORONARY ANGIOGRAPHY N/A 09/28/2018   Procedure: LEFT HEART CATH AND CORONARY ANGIOGRAPHY;  Surgeon: Troy Sine, MD;  Location: [place] [facility] [place];  Service: Cardiovascular;  Laterality: N/A;  . PACEMAKER IMPLANT N/A 02/13/2019   Procedure: PACEMAKER IMPLANT;  Surgeon: Evans Lance, MD;  Location: [place] [facility];  Service: Cardiovascular;  Laterality: N/A;  . PACEMAKER IMPLANT    . RIGHT/LEFT HEART CATH AND CORONARY ANGIOGRAPHY N/A 12/01/2019   Procedure: RIGHT/LEFT HEART CATH AND CORONARY ANGIOGRAPHY;  Surgeon: Larey Dresser, MD;  Location: [facility];  Service: Cardiovascular;  Laterality: N/A;  . ROTATOR CUFF REPAIR Bilateral 2017  . SINUS EXPLORATION    . TOTAL KNEE ARTHROPLASTY Left 12/10/2017   Procedure: LEFT TOTAL KNEE ARTHROPLASTY;  Surgeon: Sydnee Cabal, MD;  Location: [facility];  Service: Orthopedics;  Laterality: Left;  Adductor Block  .  TUBAL LIGATION       OB History    Gravida  5   Para  4   Term  1   Preterm  3   AB  1   Living  4     SAB  1   TAB  0   Ectopic  0   Multiple  0   Live Births  4           Family History  Problem Relation Age of Onset  . Colon cancer Maternal Grandmother   . Alzheimer's disease Mother     Social History   Tobacco Use  . Smoking status: Never Smoker    . Smokeless tobacco: Never Used  Vaping Use  . Vaping Use: Never used  Substance Use Topics  . Alcohol use: No  . Drug use: No    Home Medications Prior to Admission medications   Medication Sig Start Date End Date Taking? Authorizing Provider  ACCU-CHEK AVIVA PLUS test strip 3 (three) times daily. 11/24/19   [provider]  acetaminophen (TYLENOL) 325 MG tablet Take 2 tablets (650 mg total) by mouth every 4 (four) hours as needed for headache or mild pain. 12/27/19   Shirley Friar, PA-C  acyclovir (ZOVIRAX) 800 MG tablet Take 800 mg by mouth 5 (five) times daily. 01/09/20   [provider]  albuterol (PROVENTIL HFA;VENTOLIN HFA) 108 (90 Base) MCG/ACT inhaler Inhale 1-2 puffs into the lungs every 6 (six) hours as needed for wheezing or shortness of breath. 07/30/18   Drenda Freeze, MD  amiodarone (PACERONE) 200 MG tablet Take 2 tablets by mouth for 7 days.  Then reduce to 1 tablet by mouth daily thereafter. 01/09/20   Evans Lance, MD  apixaban (ELIQUIS) 5 MG TABS tablet Take 1 tablet (5 mg total) by mouth 2 (two) times daily. 01/26/20   Consuelo Pandy, PA-C  Biotin 10 MG CAPS Take 10 mg by mouth daily.    [provider]  carvedilol (COREG) 6.25 MG tablet TAKE 1 TABLET(6.25 MG) BY MOUTH TWICE DAILY WITH A MEAL 01/24/20   Larey Dresser, MD  cetirizine (ZYRTEC) 10 MG tablet Take 10 mg by mouth daily.    [provider]  Cholecalciferol (VITAMIN D-3) 25 MCG (1000 UT) CAPS Take 1,000 Units by mouth daily.     [provider]  dapagliflozin propanediol (FARXIGA) 10 MG TABS tablet Take 1 tablet (10 mg total) by mouth daily. 01/22/20   Larey Dresser, MD  digoxin (LANOXIN) 0.125 MG tablet Take 1 tablet (0.125 mg total) by mouth daily for 7 days. 01/09/20 01/16/20  Evans Lance, MD  doxycycline (VIBRA-TABS) 100 MG tablet Take 100 mg by mouth 2 (two) times daily. 01/09/20   [provider]  fluconazole (DIFLUCAN) 100 MG  tablet Take 1 tablet (100 mg total) by mouth daily. 01/11/20   Michel Bickers, MD  folic acid (FOLVITE) 1 MG tablet Take 1 tablet (1 mg total) by mouth daily. 12/27/19   Shirley Friar, PA-C  gabapentin (NEURONTIN) 600 MG tablet Take 600 mg by mouth 2 (two) times daily.    [provider]  HYDROcodone-acetaminophen (NORCO) 10-325 MG tablet Take 1 tablet by mouth 4 (four) times daily.     [provider]  metFORMIN (GLUCOPHAGE) 500 MG tablet Take 500 mg by mouth 2 (two) times daily. 07/26/19   [provider]  methotrexate (RHEUMATREX) 10 MG tablet Take 1 tablet (10 mg total) by  mouth once a week. Caution:Chemotherapy. Protect from light. 01/22/20   Larey Dresser, MD  mexiletine (MEXITIL) 250 MG capsule Take 1 capsule (250 mg total) by mouth every 12 (twelve) hours for 7 days. 01/09/20 01/16/20  Evans Lance, MD  Multiple Vitamins-Minerals (CENTRUM SILVER 50+WOMEN) TABS Take 1 tablet by mouth daily.    [provider]  naloxone [facility]) 4 MG/0.1ML LIQD nasal spray kit Place 1 spray into the nose as directed.    [provider]  omeprazole (PRILOSEC) 40 MG capsule Take 40 mg by mouth daily.    [provider]  ondansetron (ZOFRAN ODT) 4 MG disintegrating tablet Take 1 tablet (4 mg total) by mouth every 8 (eight) hours as needed for nausea or vomiting. 10/20/19   Lucrezia Starch, MD  pravastatin (PRAVACHOL) 10 MG tablet Take 1 tablet (10 mg total) by mouth daily at 6 PM. 01/25/20   Larey Dresser, MD  predniSONE (DELTASONE) 20 MG tablet Take 2 tablets (40 mg total) by mouth daily with breakfast. 01/30/20   Larey Dresser, MD  sacubitril-valsartan (ENTRESTO) 49-51 MG Take 1 tablet by mouth 2 (two) times daily. 01/22/20   Larey Dresser, MD  spironolactone (ALDACTONE) 25 MG tablet Take 1 tablet (25 mg total) by mouth daily. 01/22/20   Larey Dresser, MD    Allergies    Lisinopril, Ace inhibitors, Augmentin [amoxicillin-pot clavulanate],  Benazepril, Oxycodone-acetaminophen, and Sulfa antibiotics  Review of Systems   Review of Systems  Neurological: Positive for weakness.  All other systems reviewed and are negative.   Physical Exam Updated Vital Signs BP 118/82 (BP Location: Left Arm)   Pulse 98   Temp 97.6 F (36.4 C) (Oral)   Resp 18   Wt 74.8 kg   LMP 07/25/2015 (Exact Date)   SpO2 98%   BMI 32.19 kg/m   Physical Exam Vitals and nursing note reviewed.   54 year old female, resting comfortably and in no acute distress. Vital signs are normal. Oxygen saturation is 98%, which is normal. Head is normocephalic and atraumatic. PERRLA, EOMI. Oropharynx is clear. Neck is nontender and supple without adenopathy or JVD. Back is nontender and there is no CVA tenderness. Lungs are clear without rales, wheezes, or rhonchi. Chest is nontender. Heart has regular rate and rhythm without murmur. Abdomen is soft, flat, nontender without masses or hepatosplenomegaly and peristalsis is normoactive. Extremities have no cyanosis or edema, full range of motion is present. Skin is warm and dry without rash. Neurologic: Mental status is normal, cranial nerves are intact, there are no motor or sensory deficits.  ED Results / Procedures / Treatments   Labs (all labs ordered are listed, but only abnormal results are displayed) Labs Reviewed  BASIC METABOLIC PANEL - Abnormal; Notable for the following components:      Result Value   Sodium 134 (*)    Chloride 97 (*)    Glucose, Bld 446 (*)    Creatinine, Ser 1.34 (*)    GFR calc non Af Amer 44 (*)    GFR calc Af Amer 52 (*)    All other components within normal limits  CBC WITH DIFFERENTIAL/PLATELET - Abnormal; Notable for the following components:   RBC 5.27 (*)    HCT 47.4 (*)    RDW 21.7 (*)    Lymphs Abs 0.6 (*)    All other components within normal limits  RESPIRATORY PANEL BY RT PCR (FLU A&B, COVID)  MAGNESIUM  BRAIN NATRIURETIC PEPTIDE  TSH    EKG EKG  Interpretation  Date/Time:  Monday February 05 2020 23:02:07 EDT Ventricular Rate:  96 PR Interval:    QRS Duration: 121 QT Interval:  387 QTC Calculation: 490 R Axis:   122 Text Interpretation: Sinus rhythm Right atrial enlargement Right bundle branch block Nonspecific T abnormalities, lateral leads Rightward axis When compared with ECG of 12/19/2019, Premature ventricular complexes are no longer present Confirmed by Delora Fuel ([id number]) on 02/05/2020 11:08:22 PM  Procedures Procedures  Medications Ordered in ED Medications - No data to display  ED Course  I have reviewed the triage vital signs and the nursing notes.  Pertinent lab results that were available during my care of the patient were reviewed by me and considered in my medical decision making (see chart for details).  MDM Rules/Calculators/A&P ICD discharge x2.  Old records are reviewed, and she has a history of ventricular tachycardia.  Device will need to be interrogated to see if discharges were appropriate.  ECG shows no acute changes.  11:59 PM Electrolytes are significant for trivial hyponatremia.  Magnesium is normal.  Creatinine has increased over baseline, etiology of this is unclear.  Device interrogation shows 2 shocks tonight which were appropriate.  Since 8/31, 29 episodes of interventions which were all overdrive pacing with the exception of the 2 shocks tonight.  Will request cardiology consultation.  Final Clinical Impression(s) / ED Diagnoses Final diagnoses:  Defibrillator discharge  Paroxysmal ventricular tachycardia ([place])  Hyponatremia  Acute kidney injury (nontraumatic) ([place])    Rx / DC Orders ED Discharge Orders    None       Delora Fuel, MD 95/58/31 [id number]

## 2020-02-05 NOTE — ED Triage Notes (Signed)
Pt comes via [facility] from home for fatigue, L sided CP, ICD fired twice about 30 minutes ago, [employer], 324 ASA given PTA

## 2020-02-06 ENCOUNTER — Encounter (HOSPITAL_COMMUNITY): Payer: Self-pay

## 2020-02-06 ENCOUNTER — Other Ambulatory Visit: Payer: Self-pay

## 2020-02-06 ENCOUNTER — Encounter (HOSPITAL_COMMUNITY): Payer: Medicare Other | Admitting: Cardiology

## 2020-02-06 DIAGNOSIS — Z23 Encounter for immunization: Secondary | ICD-10-CM | POA: Diagnosis present

## 2020-02-06 DIAGNOSIS — I5022 Chronic systolic (congestive) heart failure: Secondary | ICD-10-CM

## 2020-02-06 DIAGNOSIS — Z7901 Long term (current) use of anticoagulants: Secondary | ICD-10-CM | POA: Diagnosis not present

## 2020-02-06 DIAGNOSIS — Z4502 Encounter for adjustment and management of automatic implantable cardiac defibrillator: Secondary | ICD-10-CM

## 2020-02-06 DIAGNOSIS — Z7952 Long term (current) use of systemic steroids: Secondary | ICD-10-CM | POA: Diagnosis not present

## 2020-02-06 DIAGNOSIS — I480 Paroxysmal atrial fibrillation: Secondary | ICD-10-CM | POA: Diagnosis present

## 2020-02-06 DIAGNOSIS — Z881 Allergy status to other antibiotic agents status: Secondary | ICD-10-CM | POA: Diagnosis not present

## 2020-02-06 DIAGNOSIS — I351 Nonrheumatic aortic (valve) insufficiency: Secondary | ICD-10-CM | POA: Diagnosis not present

## 2020-02-06 DIAGNOSIS — Z96652 Presence of left artificial knee joint: Secondary | ICD-10-CM | POA: Diagnosis present

## 2020-02-06 DIAGNOSIS — I442 Atrioventricular block, complete: Secondary | ICD-10-CM | POA: Diagnosis present

## 2020-02-06 DIAGNOSIS — Z7984 Long term (current) use of oral hypoglycemic drugs: Secondary | ICD-10-CM | POA: Diagnosis not present

## 2020-02-06 DIAGNOSIS — D8685 Sarcoid myocarditis: Secondary | ICD-10-CM | POA: Diagnosis present

## 2020-02-06 DIAGNOSIS — Z79899 Other long term (current) drug therapy: Secondary | ICD-10-CM | POA: Diagnosis not present

## 2020-02-06 DIAGNOSIS — N179 Acute kidney failure, unspecified: Secondary | ICD-10-CM | POA: Diagnosis present

## 2020-02-06 DIAGNOSIS — I340 Nonrheumatic mitral (valve) insufficiency: Secondary | ICD-10-CM | POA: Diagnosis not present

## 2020-02-06 DIAGNOSIS — I2510 Atherosclerotic heart disease of native coronary artery without angina pectoris: Secondary | ICD-10-CM | POA: Diagnosis present

## 2020-02-06 DIAGNOSIS — Z20822 Contact with and (suspected) exposure to covid-19: Secondary | ICD-10-CM | POA: Diagnosis present

## 2020-02-06 DIAGNOSIS — E785 Hyperlipidemia, unspecified: Secondary | ICD-10-CM | POA: Diagnosis present

## 2020-02-06 DIAGNOSIS — I252 Old myocardial infarction: Secondary | ICD-10-CM | POA: Diagnosis not present

## 2020-02-06 DIAGNOSIS — I428 Other cardiomyopathies: Secondary | ICD-10-CM | POA: Diagnosis present

## 2020-02-06 DIAGNOSIS — Z885 Allergy status to narcotic agent status: Secondary | ICD-10-CM | POA: Diagnosis not present

## 2020-02-06 DIAGNOSIS — Z79891 Long term (current) use of opiate analgesic: Secondary | ICD-10-CM | POA: Diagnosis not present

## 2020-02-06 DIAGNOSIS — I472 Ventricular tachycardia: Principal | ICD-10-CM

## 2020-02-06 DIAGNOSIS — I110 Hypertensive heart disease with heart failure: Secondary | ICD-10-CM | POA: Diagnosis present

## 2020-02-06 DIAGNOSIS — E1165 Type 2 diabetes mellitus with hyperglycemia: Secondary | ICD-10-CM | POA: Diagnosis not present

## 2020-02-06 DIAGNOSIS — Z95810 Presence of automatic (implantable) cardiac defibrillator: Secondary | ICD-10-CM | POA: Diagnosis not present

## 2020-02-06 DIAGNOSIS — E871 Hypo-osmolality and hyponatremia: Secondary | ICD-10-CM | POA: Diagnosis present

## 2020-02-06 LAB — GLUCOSE, CAPILLARY
Glucose-Capillary: 141 mg/dL — ABNORMAL HIGH (ref 70–99)
Glucose-Capillary: 203 mg/dL — ABNORMAL HIGH (ref 70–99)
Glucose-Capillary: 334 mg/dL — ABNORMAL HIGH (ref 70–99)
Glucose-Capillary: 276 mg/dL — ABNORMAL HIGH (ref 70–99)

## 2020-02-06 LAB — MRSA PCR SCREENING: MRSA by PCR: POSITIVE — AB

## 2020-02-06 LAB — RESPIRATORY PANEL BY RT PCR (FLU A&B, COVID)
Influenza A by PCR: NEGATIVE
Influenza B by PCR: NEGATIVE
SARS Coronavirus 2 by RT PCR: NEGATIVE

## 2020-02-06 LAB — BRAIN NATRIURETIC PEPTIDE: B Natriuretic Peptide: 344.3 pg/mL — ABNORMAL HIGH (ref 0.0–100.0)

## 2020-02-06 LAB — APTT: aPTT: 52 seconds — ABNORMAL HIGH (ref 24–36)

## 2020-02-06 LAB — HEMOGLOBIN A1C
Hgb A1c MFr Bld: 10.2 % — ABNORMAL HIGH (ref 4.8–5.6)
Mean Plasma Glucose: 246.04 mg/dL

## 2020-02-06 LAB — TSH: TSH: 2.235 u[IU]/mL (ref 0.350–4.500)

## 2020-02-06 MED ORDER — FOLIC ACID 1 MG PO TABS
1.0000 mg | ORAL_TABLET | Freq: Every day | ORAL | Status: DC
  Administered 2020-02-06 – 2020-02-09 (×4): 1 mg via ORAL
  Filled 2020-02-06 (×4): qty 1

## 2020-02-06 MED ORDER — DIGOXIN 125 MCG PO TABS
0.1250 mg | ORAL_TABLET | Freq: Every day | ORAL | Status: DC
  Administered 2020-02-06 – 2020-02-09 (×4): 0.125 mg via ORAL
  Filled 2020-02-06 (×4): qty 1

## 2020-02-06 MED ORDER — APIXABAN 5 MG PO TABS: 5.0000 mg | ORAL_TABLET | Freq: Two times a day (BID) | ORAL | Status: DC

## 2020-02-06 MED ORDER — SACUBITRIL-VALSARTAN 49-51 MG PO TABS
1.0000 | ORAL_TABLET | Freq: Two times a day (BID) | ORAL | Status: DC
  Administered 2020-02-06: 1 via ORAL
  Filled 2020-02-06 (×2): qty 1

## 2020-02-06 MED ORDER — DAPAGLIFLOZIN PROPANEDIOL 10 MG PO TABS
10.0000 mg | ORAL_TABLET | Freq: Every day | ORAL | Status: DC
  Administered 2020-02-06 – 2020-02-09 (×3): 10 mg via ORAL
  Filled 2020-02-06 (×4): qty 1

## 2020-02-06 MED ORDER — CHLORHEXIDINE GLUCONATE CLOTH 2 % EX PADS
6.0000 | MEDICATED_PAD | Freq: Every day | CUTANEOUS | Status: DC
  Administered 2020-02-06 – 2020-02-09 (×4): 6 via TOPICAL

## 2020-02-06 MED ORDER — AMIODARONE LOAD VIA INFUSION
150.0000 mg | Freq: Once | INTRAVENOUS | Status: AC
  Administered 2020-02-06: 150 mg via INTRAVENOUS
  Filled 2020-02-06: qty 83.34

## 2020-02-06 MED ORDER — METHOTREXATE 2.5 MG PO TABS: 10.0000 mg | ORAL_TABLET | ORAL | Status: DC

## 2020-02-06 MED ORDER — PANTOPRAZOLE SODIUM 40 MG PO TBEC
80.0000 mg | DELAYED_RELEASE_TABLET | Freq: Every day | ORAL | Status: DC
  Administered 2020-02-06 – 2020-02-09 (×4): 80 mg via ORAL
  Filled 2020-02-06 (×5): qty 2

## 2020-02-06 MED ORDER — ALBUTEROL SULFATE HFA 108 (90 BASE) MCG/ACT IN AERS
1.0000 | INHALATION_SPRAY | Freq: Four times a day (QID) | RESPIRATORY_TRACT | Status: DC | PRN
  Filled 2020-02-06: qty 6.7

## 2020-02-06 MED ORDER — INFLUENZA VAC SPLIT QUAD 0.5 ML IM SUSY
0.5000 mL | PREFILLED_SYRINGE | INTRAMUSCULAR | Status: AC
  Administered 2020-02-09: 0.5 mL via INTRAMUSCULAR
  Filled 2020-02-06: qty 0.5

## 2020-02-06 MED ORDER — METHOTREXATE 2.5 MG PO TABS
12.5000 mg | ORAL_TABLET | ORAL | Status: DC
  Filled 2020-02-06: qty 5

## 2020-02-06 MED ORDER — AMIODARONE HCL IN DEXTROSE 360-4.14 MG/200ML-% IV SOLN
30.0000 mg/h | INTRAVENOUS | Status: DC
  Administered 2020-02-06 – 2020-02-07 (×4): 30 mg/h via INTRAVENOUS
  Filled 2020-02-06 (×3): qty 200

## 2020-02-06 MED ORDER — GABAPENTIN 600 MG PO TABS
600.0000 mg | ORAL_TABLET | Freq: Two times a day (BID) | ORAL | Status: DC
  Administered 2020-02-06 – 2020-02-09 (×6): 600 mg via ORAL
  Filled 2020-02-06 (×6): qty 1

## 2020-02-06 MED ORDER — AMIODARONE HCL IN DEXTROSE 360-4.14 MG/200ML-% IV SOLN
60.0000 mg/h | INTRAVENOUS | Status: DC
  Administered 2020-02-06 (×2): 60 mg/h via INTRAVENOUS
  Filled 2020-02-06 (×2): qty 200

## 2020-02-06 MED ORDER — INSULIN ASPART 100 UNIT/ML ~~LOC~~ SOLN
0.0000 [IU] | Freq: Every day | SUBCUTANEOUS | Status: DC
  Administered 2020-02-06: 3 [IU] via SUBCUTANEOUS
  Administered 2020-02-07 – 2020-02-08 (×2): 5 [IU] via SUBCUTANEOUS

## 2020-02-06 MED ORDER — AMIODARONE HCL 100 MG PO TABS: 100.0000 mg | ORAL_TABLET | Freq: Every day | ORAL | Status: DC

## 2020-02-06 MED ORDER — SPIRONOLACTONE 25 MG PO TABS
25.0000 mg | ORAL_TABLET | Freq: Every day | ORAL | Status: DC
  Administered 2020-02-06 – 2020-02-09 (×4): 25 mg via ORAL
  Filled 2020-02-06 (×4): qty 1

## 2020-02-06 MED ORDER — PREDNISONE 20 MG PO TABS
40.0000 mg | ORAL_TABLET | Freq: Every day | ORAL | Status: DC
  Administered 2020-02-06: 40 mg via ORAL
  Filled 2020-02-06: qty 2

## 2020-02-06 MED ORDER — APIXABAN 5 MG PO TABS
5.0000 mg | ORAL_TABLET | Freq: Two times a day (BID) | ORAL | Status: DC
  Administered 2020-02-06: 5 mg via ORAL
  Filled 2020-02-06 (×2): qty 1

## 2020-02-06 MED ORDER — SODIUM CHLORIDE 0.9 % IV SOLN
1000.0000 mg | Freq: Once | INTRAVENOUS | Status: AC
  Administered 2020-02-06: 1000 mg via INTRAVENOUS
  Filled 2020-02-06: qty 8

## 2020-02-06 MED ORDER — MUPIROCIN 2 % EX OINT
1.0000 "application " | TOPICAL_OINTMENT | Freq: Two times a day (BID) | CUTANEOUS | Status: DC
  Administered 2020-02-06 – 2020-02-09 (×7): 1 via NASAL
  Filled 2020-02-06: qty 22

## 2020-02-06 MED ORDER — INSULIN ASPART 100 UNIT/ML ~~LOC~~ SOLN
0.0000 [IU] | Freq: Three times a day (TID) | SUBCUTANEOUS | Status: DC
  Administered 2020-02-06: 8 [IU] via SUBCUTANEOUS
  Administered 2020-02-06: 3 [IU] via SUBCUTANEOUS
  Administered 2020-02-07: 15 [IU] via SUBCUTANEOUS
  Administered 2020-02-07: 8 [IU] via SUBCUTANEOUS
  Administered 2020-02-07 – 2020-02-08 (×2): 5 [IU] via SUBCUTANEOUS
  Administered 2020-02-09: 3 [IU] via SUBCUTANEOUS

## 2020-02-06 MED ORDER — HEPARIN (PORCINE) 25000 UT/250ML-% IV SOLN
950.0000 [IU]/h | INTRAVENOUS | Status: AC
  Administered 2020-02-06: 900 [IU]/h via INTRAVENOUS
  Administered 2020-02-07: 950 [IU]/h via INTRAVENOUS
  Filled 2020-02-06 (×2): qty 250

## 2020-02-06 MED ORDER — AMIODARONE HCL 200 MG PO TABS: 200.0000 mg | ORAL_TABLET | Freq: Every day | ORAL | Status: DC

## 2020-02-06 MED ORDER — PNEUMOCOCCAL VAC POLYVALENT 25 MCG/0.5ML IJ INJ
0.5000 mL | INJECTION | INTRAMUSCULAR | Status: AC
  Administered 2020-02-09: 0.5 mL via INTRAMUSCULAR
  Filled 2020-02-06: qty 0.5

## 2020-02-06 MED ORDER — PREDNISONE 20 MG PO TABS
60.0000 mg | ORAL_TABLET | Freq: Every day | ORAL | Status: DC
  Administered 2020-02-07 – 2020-02-09 (×3): 60 mg via ORAL
  Filled 2020-02-06 (×3): qty 3

## 2020-02-06 MED ORDER — CARVEDILOL 6.25 MG PO TABS
6.2500 mg | ORAL_TABLET | Freq: Two times a day (BID) | ORAL | Status: DC
  Administered 2020-02-06 (×2): 6.25 mg via ORAL
  Filled 2020-02-06 (×3): qty 1

## 2020-02-06 MED ORDER — MAGNESIUM SULFATE 2 GM/50ML IV SOLN
2.0000 g | Freq: Once | INTRAVENOUS | Status: AC
  Administered 2020-02-06: 2 g via INTRAVENOUS
  Filled 2020-02-06: qty 50

## 2020-02-06 MED ORDER — HYDROCODONE-ACETAMINOPHEN 10-325 MG PO TABS
1.0000 | ORAL_TABLET | Freq: Four times a day (QID) | ORAL | Status: DC | PRN
  Administered 2020-02-06 – 2020-02-08 (×2): 1 via ORAL
  Filled 2020-02-06 (×2): qty 1

## 2020-02-06 NOTE — Consult Note (Addendum)
Cardiology Consultation:   Patient ID: Lisa Poole MRN: [id number]; DOB: 07/03/1965  Admit date: 02/05/2020 Date of Consult: 02/06/2020  Primary Care Provider: Milford Cage, PA [facility] Cardiologist/AHF: Dr. [facility] Electrophysiologist:  Dr. Lovena Le   Patient Profile:   Lisa Poole is a 54 y.o. female with a hx of HTN, HLD, DM, CHB w/PPM > ICD, No obstructive CAD, subclinical breif AFib by device, developed NICM suspected sarcoid with cardiac sarcoid on methotrexate and prednisone outpatient,  R IJ DVT on eliquis, who is being seen today for the evaluation of VT at the request of Dr. Meda Coffee.  History of Present Illness:   Lisa Poole was recently hospitalized in August last month with VT, likely triggered by her sarcoid and recent reduction of her steroid dose, started on mexiletine, notes discuss VT with multiple morphologies amio held with QT prolongation and torsades, planned for PET   H&P nicely outlines her history: Recent events: 09/2018 - Echo EF 60-65%, mild LVH, RV normal. Mild- mod AI. No stenosis 02/2019 - developed complete heart block, s/p PPM 08/2019 - COVID 19 infection, did not require hospitalization 11/26/2019 - acute SOB, acute heart failure, tele showing episodes of VT, Amio for suppression 11/2019 - Echo EF newly decreased to 20-25% with septal-lateral dyssynchrony and septal severe hypokinesis, normal RV, moderate TR, moderate MR 11/28/2019 - cardiogenic shock requiring pressors 12/01/2019 - IABP placement, LHC/RHC with no significant CAD, elevated filling pressures, and low cardiac output on norepinephrine 12/02/2019 - Torsades/VT x 3 with prolonged QT required DC-CV. Amio switched to lido.  12/04/2019 - IABP removed, hematoma right groin, Lidocaine stopped 12/05/2019 - CMR showed patchy LGE in the basal septum, basal-mid inferoseptum/inferior wall, basal anterior wall, mid anterolateral wall. Cardiac MRI LGE pattern could be consistent  with cardiac sarcoidosis  12/19/2019 - admitted with prolonged QT and ventricular tachycardia. PPM changed to BiV ICD. 12/2019 - Right groin infection, MRSA on culture, treated with Linezolid, Rt IJ DVT (placed on Eliquis) 01/08/2020 - episode of shingles, given acyclovir and doxycycline Sep 2021 - Mexitil stopped due to nausea, Amio restarted  She saw Dr. Lovena Le in the office, noted more VT, planned to stop mexiletine (w/nausea) and start amiodarone, device programmed VT one zone to deliver both burst and ramp.  A f/u note by nursing on 9/14 reports also stopped dig.  She had her PET scan done  She was to have seen dr. Aundra Dubin today out patient.  She came with 2 ICD shocks, no pre-post symptoms No new/escalation of her HF symptoms  LABS K+ 4.9 Mag 1.9 BUN/Creat 18/1.34 BNP 344 WBC 8.0 H/H 14/47 Plts 257 TSH 2.235  01/26/20: PET FINAL COMMENTS   Cardiac PET with concurrent CT was performed.   Myocardial perfusion Rb-82 imaging demonstrates perfusion defects in 5 segments.   Metabolic imaging performed with FDG PET and demonstrates mild to moderate abnormal uptake in the 5/17 segments   demonstrating perfusion abnormalities.   FDG PET/CT chest and upper abdomen: no evidence of metabolically active sarcoid in the lymph nodes, no metabolically active   pulmonary opacities. Calcified pulmonary nodules, hilar lymph nodes and calcifications in the spleen are consistent with   sequela of granulomatous disease.   Impression:   1. Findings in the myocardium are consistent with active inflammation.   2. No evidence of metabolically active extracardiac sarcoidosis, granulomatous disease in the chest and abdomen consistent   with treated granulomas.    Device: MDT CRT-D Battery and lead measurements are good  VP 85.3% On 01/11/20 she had: >> 28 ATPs 02/03/21  Noted some NSVT started 02/05/20 >> 5 ATP > 20J > 25J   No CP, palpitations, no SOB at rest, minimal DOE Was at rest when shocked,  no near syncope or syncope.   Past Medical History:  Diagnosis Date   Aortic insufficiency    Asthma 09/13/2006   Complete heart block ([place]) 02/11/2019   Cystocele without uterine prolapse 12/12/2018   Diabetes mellitus without complication (HCC)    Elevated troponin level    Hypercholesteremia    Hypertension    Primary osteoarthritis of left knee 12/10/2017    Past Surgical History:  Procedure Laterality Date   APPENDECTOMY     BIV ICD INSERTION CRT-D N/A 12/07/2019   Procedure: BIV ICD INSERTION CRT-D;  Surgeon: Evans Lance, MD;  Location: [place] [facility];  Service: Cardiovascular;  Laterality: N/A;   IABP INSERTION N/A 12/01/2019   Procedure: IABP Insertion;  Surgeon: Larey Dresser, MD;  Location: [place] [facility];  Service: Cardiovascular;  Laterality: N/A;   LEFT HEART CATH AND CORONARY ANGIOGRAPHY N/A 09/28/2018   Procedure: LEFT HEART CATH AND CORONARY ANGIOGRAPHY;  Surgeon: Troy Sine, MD;  Location: [place] [facility];  Service: Cardiovascular;  Laterality: N/A;   PACEMAKER IMPLANT N/A 02/13/2019   Procedure: PACEMAKER IMPLANT;  Surgeon: Evans Lance, MD;  Location: [place] [facility];  Service: Cardiovascular;  Laterality: N/A;   PACEMAKER IMPLANT     RIGHT/LEFT HEART CATH AND CORONARY ANGIOGRAPHY N/A 12/01/2019   Procedure: RIGHT/LEFT HEART CATH AND CORONARY ANGIOGRAPHY;  Surgeon: Larey Dresser, MD;  Location: [place] [facility];  Service: Cardiovascular;  Laterality: N/A;   ROTATOR CUFF REPAIR Bilateral 2017   SINUS EXPLORATION     TOTAL KNEE ARTHROPLASTY Left 12/10/2017   Procedure: LEFT TOTAL KNEE ARTHROPLASTY;  Surgeon: Sydnee Cabal, MD;  Location: [facility];  Service: Orthopedics;  Laterality: Left;  Adductor Block   TUBAL LIGATION       Home Medications:  Prior to Admission medications   Medication Sig Start Date End Date Taking? Authorizing Provider  acetaminophen (TYLENOL) 325 MG tablet Take 2 tablets (650 mg total) by mouth every 4 (four) hours as  needed for headache or mild pain. 12/27/19  Yes Shirley Friar, PA-C  albuterol (PROVENTIL HFA;VENTOLIN HFA) 108 (90 Base) MCG/ACT inhaler Inhale 1-2 puffs into the lungs every 6 (six) hours as needed for wheezing or shortness of breath. 07/30/18  Yes Drenda Freeze, MD  amiodarone (PACERONE) 200 MG tablet Take 2 tablets by mouth for 7 days.  Then reduce to 1 tablet by mouth daily thereafter. Patient taking differently: Take 200 mg by mouth daily.  01/09/20  Yes Evans Lance, MD  apixaban (ELIQUIS) 5 MG TABS tablet Take 1 tablet (5 mg total) by mouth 2 (two) times daily. 01/26/20  Yes Simmons, Brittainy M, PA-C  carvedilol (COREG) 6.25 MG tablet TAKE 1 TABLET(6.25 MG) BY MOUTH TWICE DAILY WITH A MEAL 01/24/20  Yes Larey Dresser, MD  dapagliflozin propanediol (FARXIGA) 10 MG TABS tablet Take 1 tablet (10 mg total) by mouth daily. 01/22/20  Yes Larey Dresser, MD  pravastatin (PRAVACHOL) 10 MG tablet Take 10 mg by mouth daily.   Yes [provider]  predniSONE (DELTASONE) 20 MG tablet Take 2 tablets (40 mg total) by mouth daily with breakfast. 01/30/20  Yes Larey Dresser, MD  sacubitril-valsartan (ENTRESTO) 49-51 MG Take 1 tablet by mouth  2 (two) times daily. 01/22/20  Yes Larey Dresser, MD  spironolactone (ALDACTONE) 25 MG tablet Take 1 tablet (25 mg total) by mouth daily. 01/22/20  Yes Larey Dresser, MD  ACCU-CHEK AVIVA PLUS test strip 3 (three) times daily. 11/24/19   [provider]  acyclovir (ZOVIRAX) 800 MG tablet Take 800 mg by mouth 5 (five) times daily. 01/09/20   [provider]  Biotin 10 MG CAPS Take 10 mg by mouth daily.    [provider]  cetirizine (ZYRTEC) 10 MG tablet Take 10 mg by mouth daily.    [provider]  Cholecalciferol (VITAMIN D-3) 25 MCG (1000 UT) CAPS Take 1,000 Units by mouth daily.     [provider]  digoxin (LANOXIN) 0.125 MG tablet Take 1 tablet (0.125 mg total) by mouth daily for 7 days.  01/09/20 01/16/20  Evans Lance, MD  doxycycline (VIBRA-TABS) 100 MG tablet Take 100 mg by mouth 2 (two) times daily. 01/09/20   [provider]  fluconazole (DIFLUCAN) 100 MG tablet Take 1 tablet (100 mg total) by mouth daily. 01/11/20   Michel Bickers, MD  folic acid (FOLVITE) 1 MG tablet Take 1 tablet (1 mg total) by mouth daily. 12/27/19   Shirley Friar, PA-C  gabapentin (NEURONTIN) 600 MG tablet Take 600 mg by mouth 2 (two) times daily.    [provider]  HYDROcodone-acetaminophen (NORCO) 10-325 MG tablet Take 1 tablet by mouth 4 (four) times daily.     [provider]  lovastatin (MEVACOR) 10 MG tablet Take 10 mg by mouth at bedtime.    [provider]  metFORMIN (GLUCOPHAGE) 500 MG tablet Take 500 mg by mouth 2 (two) times daily. 07/26/19   [provider]  methotrexate (RHEUMATREX) 10 MG tablet Take 1 tablet (10 mg total) by mouth once a week. Caution:Chemotherapy. Protect from light. 01/22/20   Larey Dresser, MD  mexiletine (MEXITIL) 250 MG capsule Take 1 capsule (250 mg total) by mouth every 12 (twelve) hours for 7 days. 01/09/20 01/16/20  Evans Lance, MD  Multiple Vitamins-Minerals (CENTRUM SILVER 50+WOMEN) TABS Take 1 tablet by mouth daily.    [provider]  naloxone [facility]) 4 MG/0.1ML LIQD nasal spray kit Place 1 spray into the nose as directed.    [provider]  omeprazole (PRILOSEC) 40 MG capsule Take 40 mg by mouth daily.    [provider]  ondansetron (ZOFRAN ODT) 4 MG disintegrating tablet Take 1 tablet (4 mg total) by mouth every 8 (eight) hours as needed for nausea or vomiting. 10/20/19   Lucrezia Starch, MD    Inpatient Medications: Scheduled Meds:  apixaban  5 mg Oral BID   carvedilol  6.25 mg Oral BID WC   Chlorhexidine Gluconate Cloth  6 each Topical Q0600   dapagliflozin propanediol  10 mg Oral Daily   [START ON 02/07/2020] influenza vac split quadrivalent PF  0.5 mL Intramuscular  Tomorrow-1000   mupirocin ointment  1 application Nasal BID   [START ON 02/07/2020] pneumococcal 23 valent vaccine  0.5 mL Intramuscular Tomorrow-1000   predniSONE  40 mg Oral Q breakfast   sacubitril-valsartan  1 tablet Oral BID   Continuous Infusions:  amiodarone 60 mg/hr (02/06/20 0814)   Followed by   amiodarone     PRN Meds:   Allergies:    Allergies  Allergen Reactions   Lisinopril Other (See Comments) and Cough    Flu like symptoms, also    Ace  Inhibitors Other (See Comments)    Flu-like symptoms   Augmentin [Amoxicillin-Pot Clavulanate] Nausea And Vomiting   Benazepril Other (See Comments)    Flu like symptoms   Oxycodone-Acetaminophen Nausea And Vomiting   Sulfa Antibiotics Nausea And Vomiting    Social History:   Social History   Socioeconomic History   Marital status: Married    Spouse name: Not on file   Number of children: Not on file   Years of education: Not on file   Highest education level: Not on file  Occupational History   Not on file  Tobacco Use   Smoking status: Never Smoker   Smokeless tobacco: Never Used  Vaping Use   Vaping Use: Never used  Substance and Sexual Activity   Alcohol use: No   Drug use: No   Sexual activity: Yes    Birth control/protection: Post-menopausal  Other Topics Concern   Not on file  Social History Narrative   Not on file   Social Determinants of Health   Financial Resource Strain:    Difficulty of Paying Living Expenses: Not on file  Food Insecurity:    Worried About Running Out of Food in the Last Year: Not on file   [employer] of Food in the Last Year: Not on file  Transportation Needs:    Lack of Transportation (Medical): Not on file   Lack of Transportation (Non-Medical): Not on file  Physical Activity:    Days of Exercise per Week: Not on file   Minutes of Exercise per Session: Not on file  Stress:    Feeling of Stress : Not on file  Social Connections:    Frequency of Communication with Friends  and Family: Not on file   Frequency of Social Gatherings with Friends and Family: Not on file   Attends Religious Services: Not on file   Active Member of Clubs or Organizations: Not on file   Attends Archivist Meetings: Not on file   Marital Status: Not on file  Intimate Partner Violence:    Fear of Current or Ex-Partner: Not on file   Emotionally Abused: Not on file   Physically Abused: Not on file   Sexually Abused: Not on file    Family History:   Family History  Problem Relation Age of Onset   Colon cancer Maternal Grandmother    Alzheimer's disease Mother      ROS:  Please see the history of present illness.  All other ROS reviewed and negative.     Physical Exam/Data:   Vitals:   02/06/20 0030 02/06/20 0322 02/06/20 0356 02/06/20 0745  BP: 95/67 118/82  (!) 131/108  Pulse: 96 98    Resp: _0 Temp:  97.6 F (36.4 C)  97.7 F (36.5 C)  TempSrc:  Oral  Oral  SpO2: 96% 98%    Weight:  74.8 kg    Height:   5' (1.524 m)    No intake or output data in the 24 hours ending 02/06/20 0840 Last 3 Weights 02/06/2020 01/11/2020 01/09/2020  Weight (lbs) 164 lb 12.8 oz 170 lb 170 lb 3.2 oz  Weight (kg) 74.753 kg 77.111 kg 77.202 kg     Body mass index is 32.19 kg/m.  General:  Well nourished, well developed, in no acute distress HEENT: normal Lymph: no adenopathy Neck: no JVD Endocrine:  No thryomegaly Vascular: No carotid bruits  Cardiac:  RRR; no murmurs, gallops or rubs Lungs:  CTA b/l, no  wheezing, rhonchi or rales  Abd: soft, nontender  Ext: no edema Musculoskeletal:  No deformities Skin: warm and dry  Neuro:  no focal abnormalities noted Psych:  Normal affect    EKG:  The EKG was personally reviewed and demonstrates:    SR , RBBB looking morphology is paced, QT387, QTc 432m, QRS 1234m  Telemetry:  Telemetry was personally reviewed and demonstrates:   SR 80's, frequent PVCs of muliple morphologies, intermittent NSVT, longest I saw was 9  beats    Relevant CV Studies:  01/26/20: PET FINAL COMMENTS   Cardiac PET with concurrent CT was performed.   Myocardial perfusion Rb-82 imaging demonstrates perfusion defects in 5 segments.   Metabolic imaging performed with FDG PET and demonstrates mild to moderate abnormal uptake in the 5/17 segments   demonstrating perfusion abnormalities.   FDG PET/CT chest and upper abdomen: no evidence of metabolically active sarcoid in the lymph nodes, no metabolically active   pulmonary opacities. Calcified pulmonary nodules, hilar lymph nodes and calcifications in the spleen are consistent with   sequela of granulomatous disease.   Impression:   1. Findings in the myocardium are consistent with active inflammation.   2. No evidence of metabolically active extracardiac sarcoidosis, granulomatous disease in the chest and abdomen consistent   with treated granulomas.    12/01/2019: R/LHC 1. Elevated right and left heart filling pressures.  2. Low cardiac output on norepinephrine 3.  3. Mixed pulmonary venous/pulmonary arterial hypertension.  4. No significant coronary disease.  5. Successful IABP placement.    11/27/2019: TTE IMPRESSIONS  1. Left ventricular ejection fraction, by estimation, is 20 to 25%. The  left ventricle has severely decreased function. The left ventricle  demonstrates regional wall motion abnormalities (see scoring  diagram/findings for description). The left  ventricular internal cavity size was mildly dilated. Left ventricular  diastolic parameters are indeterminate.   2. Right ventricular systolic function is normal. The right ventricular  size is mildly enlarged. There is moderately elevated pulmonary artery  systolic pressure. The estimated right ventricular systolic pressure is  4741.9mHg.   3. Left atrial size was mildly dilated.   4. Right atrial size was moderately dilated.   5. The mitral valve is normal in structure. Moderate mitral valve    regurgitation.   6. Tricuspid valve regurgitation is moderate to severe.   7. The aortic valve is grossly normal. Aortic valve regurgitation is  mild.   8. The inferior vena cava is normal in size with <50% respiratory  variability, suggesting right atrial pressure of 8 mmHg.    Laboratory Data:  High Sensitivity Troponin:  No results for input(s): TROPONINIHS in the last 720 hours.   Chemistry Recent Labs  Lab 02/05/20 2307  NA 134*  K 4.9  CL 97*  CO2 22  GLUCOSE 446*  BUN 18  CREATININE 1.34*  CALCIUM 9.8  GFRNONAA 44*  GFRAA 52*  ANIONGAP 15    No results for input(s): PROT, ALBUMIN, AST, ALT, ALKPHOS, BILITOT in the last 168 hours. Hematology Recent Labs  Lab 02/05/20 2307  WBC 8.0  RBC 5.27*  HGB 14.7  HCT 47.4*  MCV 89.9  MCH 27.9  MCHC 31.0  RDW 21.7*  PLT 257   BNPNo results for input(s): BNP, PROBNP in the last 168 hours.  DDimer No results for input(s): DDIMER in the last 168 hours.   Radiology/Studies:  No results found. {   Assessment and Plan:   1. VT  Recurrent and not new for her  I have discussed with Dr. Lovena Le her admission with VT/ICD shocks Stared on amiodarone with plans to keep IV amio for 24-48 hours daily EKGs ordered   2. NICM 3. Chronic CHF      Discussed with Dr. Aundra Dubin, they will see her      Continue home meds, PET scan reviewed, feels she has active cardiac sarcoid, will come on board and help manage her      Dig was stopped out pt via Dr. Lovena Le, Dr. Aundra Dubin will discuss with dr. Lovena Le  Her coreg, farxiga, entresto, prednisone were continued on admission  Will continue her aldactone, daily labs  Methotrexate will be due tomorrow, takes on Wed. (weekly)  HF team will manage steroids going forward   4. PAFib     H/o R IJ DVT     eliquis continued on admission  5. DM     SSI while here (mod scale given steroids)   For questions or updates, please contact [place] Please consult [email or web address]  for contact info under    Signed, Baldwin Jamaica, PA-C  02/06/2020 8:40 AM  EP Attending  Patient seen and examined. She is well known to me. Discussed with Dr. Aundra Dubin. She presents with recurrent VT. Review of her ICD demonstrates sustained but fairly slow VT. Unfortunately she had a single PVC in the fast VT zone which resulted in ATP and then ICD shock. Her PET scan has been reviewed and strongly suggests active cardiac sarcoid. She has been bolused with IV amiodarone and AA drug plan will be to continue IV amio and then transition to oral amio at a higher dose. She will undergo a steroid bolus as well and continue methotrexate.  Carleene Overlie Mariadelcarmen Corella,MD

## 2020-02-06 NOTE — Progress Notes (Addendum)
Inpatient Diabetes Program Recommendations  AACE/ADA: New Consensus Statement on Inpatient Glycemic Control (2015)  Target Ranges:  Prepandial:   less than 140 mg/dL      Peak postprandial:   less than 180 mg/dL (1-2 hours)      Critically ill patients:  140 - 180 mg/dL   Lab Results  Component Value Date   GLUCAP 203 (H) 02/06/2020   HGBA1C 10.2 (H) 02/06/2020    Review of Glycemic Control Results for Lisa Poole, Lisa Poole (MRN [id number]) as of 02/06/2020 13:25  Ref. Range 02/05/2020 23:07  Glucose Latest Ref Range: 70 - 99 mg/dL 446 (H)  Results for Lisa Poole, Lisa Poole (MRN [id number]) as of 02/06/2020 13:25  Ref. Range 02/06/2020 07:41 02/06/2020 11:36  Glucose-Capillary Latest Ref Range: 70 - 99 mg/dL 141 (H) 203 (H)   Diabetes history: Type 2 DM Outpatient Diabetes medications: Farxiga 10 mg QD, Metformin 500 mg BID Current orders for Inpatient glycemic control: Novolog 0-15 units TID, Novolog 0-5 units QHS, Farxiga 10 mg QD Prednisone 40 mg QAM  Inpatient Diabetes Program Recommendations:    Spoke with patient regarding outpatient diabetes management. Patient has been well controlled outpatient (A1C <7%) until recently starting steroids. Reviewed patient's current A1c of 10.2%. Explained what a A1c is and what it measures. Also reviewed goal A1c with patient, importance of good glucose control @ home, and blood sugar goals. Reviewed patho of DM, need for insulin, role of pancreas, signs and symptoms of hypo vs hyper glycemia,  Intervention, impact of steroids on glucose trends, vascular changes and comorbidities.  Patient working towards obtaining more lancets through pharmacy. At home was checking once per day. Reviewed recommended frequency of CBG checks, especially with serum of 446 mg/dL on presentation. Reviewed when to call MD. Discussed role of endocrinology and will attach list to DC summary.  Patient denies drinking sugary beverages and reports only occasionally taking in starches. She  states, "Our family has always worked to be mindful." Encouraged continued mindfulness, plate method and alternatives. At discharge would recommend adding insulin, especially given prolonged plan for steroids. Following glucose trends, will plan to see patient again tomorrow to demonstrate insulin pens and further assess outpatient needs.   Thanks, Bronson Curb, MSN, RNC-OB Diabetes Coordinator [phone] (8a-5p)

## 2020-02-06 NOTE — Progress Notes (Signed)
   02/06/20 2000  Clinical Encounter Type  Visited With Patient  Visit Type Initial  Referral From Nurse  Consult/Referral To Chaplain  Spiritual Encounters  Spiritual Needs Emotional  Stress Factors  Patient Stress Factors Exhausted;Health changes  Family Stress Factors None identified  Chaplain visited patient to take AD at her request. She will call when she is ready to sign.

## 2020-02-06 NOTE — Progress Notes (Signed)
ANTICOAGULATION CONSULT NOTE - Initial Consult  Pharmacy Consult for apixaban>heparin Indication: recent RIJ cloth  Allergies  Allergen Reactions  . Lisinopril Other (See Comments) and Cough    Flu like symptoms, also   . Ace Inhibitors Other (See Comments)    Flu-like symptoms  . Augmentin [Amoxicillin-Pot Clavulanate] Nausea And Vomiting  . Benazepril Other (See Comments)    Flu like symptoms  . Oxycodone-Acetaminophen Nausea And Vomiting  . Sulfa Antibiotics Nausea And Vomiting    Patient Measurements: Height: 5' (152.4 cm) Weight: 74.8 kg (164 lb 12.8 oz) IBW/kg (Calculated) : 45.5 Heparin Dosing Weight: 62.2 kg  Vital Signs: Temp: 98.1 F (36.7 C) (09/28 1141) Temp Source: Oral (09/28 1141) BP: 102/74 (09/28 1141)  Labs: Recent Labs    02/05/20 2307  HGB 14.7  HCT 47.4*  PLT 257  CREATININE 1.34*    Estimated Creatinine Clearance: 42.8 mL/min (A) (by C-G formula based on SCr of 1.34 mg/dL (H)).   Medical History: Past Medical History:  Diagnosis Date  . Aortic insufficiency   . Asthma 09/13/2006  . Complete heart block ([place]) 02/11/2019  . Cystocele without uterine prolapse 12/12/2018  . Diabetes mellitus without complication ([place])   . Elevated troponin level   . Hypercholesteremia   . Hypertension   . Primary osteoarthritis of left knee 12/10/2017    Medications:  Scheduled:  . carvedilol  6.25 mg Oral BID WC  . Chlorhexidine Gluconate Cloth  6 each Topical Q0600  . dapagliflozin propanediol  10 mg Oral Daily  . digoxin  0.125 mg Oral Daily  . folic acid  1 mg Oral Daily  . [START ON 02/07/2020] influenza vac split quadrivalent PF  0.5 mL Intramuscular Tomorrow-1000  . insulin aspart  0-15 Units Subcutaneous TID WC  . insulin aspart  0-5 Units Subcutaneous QHS  . [START ON 02/07/2020] methotrexate  12.5 mg Oral Weekly  . mupirocin ointment  1 application Nasal BID  . [START ON 02/07/2020] pneumococcal 23 valent vaccine  0.5 mL Intramuscular  Tomorrow-1000  . [START ON 02/07/2020] predniSONE  60 mg Oral Q breakfast  . sacubitril-valsartan  1 tablet Oral BID  . spironolactone  25 mg Oral Daily    Assessment: 29 yof with hx of Afib/RIJ clot in Aug - apixaban last received on 9/28@0405 . Plan for bronch in future - will transition to heparin per CCM prior.   Hgb 14.7, plt 257. No s/sx of bleeding. Given recent DOAC use, will monitor heparin level and aPTT until correlate.   Goal of Therapy:  Heparin level 0.3-0.7 units/ml aPTT 66-102 seconds Monitor platelets by anticoagulation protocol: Yes   Plan:  Stop apixaban Start heparin infusion at 900 units/hr on 9/28@1600  Order aPTT in 6 hr Monitor heparin level/aPTT, CBC, for s/sx of bleeding   Antonietta Jewel, PharmD, [place] Pharmacist  Phone: [phone] 02/06/2020 3:37 PM  Please check AMION for all [place] phone numbers After 10:00 PM, call [place] [phone]

## 2020-02-06 NOTE — Consult Note (Signed)
NAME:  Lisa Poole, MRN:  [id number], DOB:  1965-07-19, LOS: 0 ADMISSION DATE:  02/05/2020, CONSULTATION DATE: September 28 REFERRING MD: Dr. Marigene Ehlers, CHIEF COMPLAINT: Ventricular tachycardia, ICD fire  Brief History   54 year old female admitted on September 27 after her ICD fired twice.  She has a recent diagnosis of systolic heart failure with concern for possible cardiac sarcoidosis.  Pulmonary and critical care medicine was consulted for evaluation of possible sarcoidosis.  History of present illness   This is a pleasant 54 year old female who had no prior cardiac history until 2020 when she developed complete heart block.  She required pacemaker placement in the fall 2020.  In March 2021 she contracted COVID and said that she had a fever to 103, myalgias, and fatigue for approximately 1 week but then fully recovered.  In July 2021 she was admitted to our facility for cardiogenic shock.  For the first time she was noted to have an LVEF of 20 to 25% (it had previously been normal around the time of her diagnosis of complete heart block).  She required hospitalization, balloon pump treatment in addition to norepinephrine.  She had torsades during that admission. .  She recovered and during that hospitalization she had a cardiac MRI which was performed showing an infiltrative pattern in her myocardium worrisome for cardiac sarcoidosis.  An ICD was placed, she was discharged home.  Postoperative course was complicated by a right groin infection from MRSA requiring involvement from infectious diseases and treatment with linezolid.  Eventually she had a cardiac PET scan performed at [facility] which showed inflammatory changes in her myocardium but no evidence of extracardiac sarcoidosis in the calcified lymphadenopathy noted throughout her body. She has been admitted several times for her ICD firing.  This happened twice on September 26 that she was admitted here.  Pulmonary and critical care medicine was  consulted to evaluate her case and consider a tissue biopsy for possible sarcoid.  Of note she was started on Solu-Medrol and methotrexate after her most recent hospitalization in August 2021 for possible cardiac sarcoidosis.  She has been taking that medication since then.  She tells me that she grew up in [place], then moved to [place] where she lived for 25 years.  She relocated to [place] and after living there for several years she moved to [place].  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  She has never worked in a factory.  She had a parakeet when she was a kid but she has not had any pets to speak of for the last several years.  Her hobbies include crocheting but she does not perform any woodworking.  She does not believe that there is any mold in her house.  She denies having a humidifier or a hot tub.  She never smoked cigarettes.  She says that she has not had problems with cough over the last several years.  She did have shortness of breath prior to her admission in July but she said that after aggressive volume removal and titration of medications her shortness of breath improved.  This has not been a consistent problem for her since that hospitalization.  She denies any unexplained arthritis symptoms or of uveitis symptoms.  Past Medical History  Asthma Complete heart block Systolic heart failure DM2 Hyperlipidemia Hypertension  Significant Hospital Events   9/26 admission  Consults:  PCCM  Procedures:    Significant Diagnostic Tests:  July 2021 CT  chest images independently reviewed showing no significant mediastinal or hilar adenopathy.  There is some calcification in the left hilum which may represent old lymphadenopathy, though she does have adjacent aortic calcification.  Of note on abdominal images there is calcification throughout the spleen.  There is no significant pulmonary parenchymal abnormality to my view  though radiology mentions some bronchovascular distribution of groundglass opacification in the bases.  There is no significant fibrotic change.  Micro Data:    Antimicrobials:     Interim history/subjective:    Objective   Blood pressure 102/74, pulse 98, temperature 98.1 F (36.7 C), temperature source Oral, resp. rate 17, height 5' (1.524 m), weight 74.8 kg, last menstrual period 07/25/2015, SpO2 98 %.        Intake/Output Summary (Last 24 hours) at 02/06/2020 1537 Last data filed at 02/06/2020 1352 Gross per 24 hour  Intake --  Output 500 ml  Net -500 ml   Filed Weights   02/06/20 0322  Weight: 74.8 kg    Examination:  Gen: well appearing, no acute distress HENT: NCAT, OP clear, neck supple without masses Eyes: PERRL, EOMi Lymph: no cervical lymphadenopathy PULM: CTA B CV: RRR, no mgr, no JVD GI: BS+, soft, nontender, no hsm Derm: no rash or skin breakdown MSK: normal bulk and tone Neuro: A&Ox4, CN II-XII intact, strength 5/5 in all 4 extremities Psyche: normal mood and affect   Resolved Hospital Problem list     Assessment & Plan:  This is a pleasant 54 year old female who has systolic heart failure with imaging findings worrisome for an infiltrative or inflammatory process.  She has no clinical findings consistent with sarcoidosis other than the cardiac abnormalities.  She does have radiologic evidence of old granulomatous disease which I suspect is likely histoplasmosis given places where she has lived over the years. Given the diagnostic uncertainty I think it is reasonable to proceed with a bronchoscopy with transbronchial biopsies to see if she does have evidence of sarcoidosis.  Typically patients with sarcoidosis will show granulomatous findings on transbronchial biopsies about 70-90% of the time. She understands that the risks of this are somewhere between 1 in 100 and 1 in 1,000 in terms of rates of complication and though she has no pulmonary parenchymal  abnormalities or specific pulmonary complaints she is willing to proceed as it may be the only way for [place] to get a diagnosis of sarcoidosis.  Systolic heart failure with possible inflammatory process, concern for sarcoidosis: Repeat high-resolution CT chest Stop anticoagulation, transition to heparin infusion Plan bronchoscopy with transbronchial biopsies, BAL for culture on February 08, 2020 N.p.o. after midnight February 07, 2020   VT: Per cardiology   Best practice:   Per primary  Labs   CBC: Recent Labs  Lab 02/05/20 2307  WBC 8.0  NEUTROABS 7.0  HGB 14.7  HCT 47.4*  MCV 89.9  PLT 361    Basic Metabolic Panel: Recent Labs  Lab 02/05/20 2307  NA 134*  K 4.9  CL 97*  CO2 22  GLUCOSE 446*  BUN 18  CREATININE 1.34*  CALCIUM 9.8  MG 1.9   GFR: Estimated Creatinine Clearance: 42.8 mL/min (A) (by C-G formula based on SCr of 1.34 mg/dL (H)). Recent Labs  Lab 02/05/20 2307  WBC 8.0    Liver Function Tests: No results for input(s): AST, ALT, ALKPHOS, BILITOT, PROT, ALBUMIN in the last 168 hours. No results for input(s): LIPASE, AMYLASE in the last 168 hours. No results for input(s): AMMONIA in the  last 168 hours.  ABG    Component Value Date/Time   PHART 7.520 (H) 12/02/2019 1106   PCO2ART 51.1 (H) 12/02/2019 1106   PO2ART 482 (H) 12/02/2019 1106   HCO3 41.9 (H) 12/02/2019 1106   TCO2 43 (H) 12/02/2019 1106   O2SAT 68.3 12/06/2019 0418     Coagulation Profile: No results for input(s): INR, PROTIME in the last 168 hours.  Cardiac Enzymes: No results for input(s): CKTOTAL, CKMB, CKMBINDEX, TROPONINI in the last 168 hours.  HbA1C: Hgb A1c MFr Bld  Date/Time Value Ref Range Status  02/06/2020 10:08 AM 10.2 (H) 4.8 - 5.6 % Final    Comment:    (NOTE) Pre diabetes:          5.7%-6.4%  Diabetes:              >6.4%  Glycemic control for   <7.0% adults with diabetes   11/28/2019 06:19 PM 7.2 (H) 4.8 - 5.6 % Final    Comment:    (NOTE) Pre  diabetes:          5.7%-6.4%  Diabetes:              >6.4%  Glycemic control for   <7.0% adults with diabetes     CBG: Recent Labs  Lab 02/06/20 0741 02/06/20 1136  GLUCAP 141* 203*    Review of Systems:   Gen: Denies fever, chills, weight change, fatigue, night sweats HEENT: Denies blurred vision, double vision, hearing loss, tinnitus, sinus congestion, rhinorrhea, sore throat, neck stiffness, dysphagia PULM: Denies shortness of breath, cough, sputum production, hemoptysis, wheezing CV: Denies chest pain, edema, orthopnea, paroxysmal nocturnal dyspnea, palpitations GI: Denies abdominal pain, nausea, vomiting, diarrhea, hematochezia, melena, constipation, change in bowel habits GU: Denies dysuria, hematuria, polyuria, oliguria, urethral discharge Endocrine: Denies hot or cold intolerance, polyuria, polyphagia or appetite change Derm: Denies rash, dry skin, scaling or peeling skin change Heme: Denies easy bruising, bleeding, bleeding gums Neuro: Denies headache, numbness, weakness, slurred speech, loss of memory or consciousness   Past Medical History  She,  has a past medical history of Aortic insufficiency, Asthma (09/13/2006), Complete heart block (HCC) (02/11/2019), Cystocele without uterine prolapse (12/12/2018), Diabetes mellitus without complication ([place]), Elevated troponin level, Hypercholesteremia, Hypertension, and Primary osteoarthritis of left knee (12/10/2017).   Surgical History    Past Surgical History:  Procedure Laterality Date  . APPENDECTOMY    . BIV ICD INSERTION CRT-D N/A 12/07/2019   Procedure: BIV ICD INSERTION CRT-D;  Surgeon: Evans Lance, MD;  Location: [facility];  Service: Cardiovascular;  Laterality: N/A;  . IABP INSERTION N/A 12/01/2019   Procedure: IABP Insertion;  Surgeon: Larey Dresser, MD;  Location: [place] [facility];  Service: Cardiovascular;  Laterality: N/A;  . LEFT HEART CATH AND CORONARY ANGIOGRAPHY N/A 09/28/2018   Procedure:  LEFT HEART CATH AND CORONARY ANGIOGRAPHY;  Surgeon: Troy Sine, MD;  Location: [place];  Service: Cardiovascular;  Laterality: N/A;  . PACEMAKER IMPLANT N/A 02/13/2019   Procedure: PACEMAKER IMPLANT;  Surgeon: Evans Lance, MD;  Location: [facility];  Service: Cardiovascular;  Laterality: N/A;  . PACEMAKER IMPLANT    . RIGHT/LEFT HEART CATH AND CORONARY ANGIOGRAPHY N/A 12/01/2019   Procedure: RIGHT/LEFT HEART CATH AND CORONARY ANGIOGRAPHY;  Surgeon: Larey Dresser, MD;  Location: [facility];  Service: Cardiovascular;  Laterality: N/A;  . ROTATOR CUFF REPAIR Bilateral 2017  . SINUS EXPLORATION    . TOTAL KNEE ARTHROPLASTY  Left 12/10/2017   Procedure: LEFT TOTAL KNEE ARTHROPLASTY;  Surgeon: Sydnee Cabal, MD;  Location: [facility];  Service: Orthopedics;  Laterality: Left;  Adductor Block  . TUBAL LIGATION       Social History   reports that she has never smoked. She has never used smokeless tobacco. She reports that she does not drink alcohol and does not use drugs.   Family History   Her family history includes Alzheimer's disease in her mother; Colon cancer in her maternal grandmother.   Allergies Allergies  Allergen Reactions  . Lisinopril Other (See Comments) and Cough    Flu like symptoms, also   . Ace Inhibitors Other (See Comments)    Flu-like symptoms  . Augmentin [Amoxicillin-Pot Clavulanate] Nausea And Vomiting  . Benazepril Other (See Comments)    Flu like symptoms  . Oxycodone-Acetaminophen Nausea And Vomiting  . Sulfa Antibiotics Nausea And Vomiting     Home Medications  Prior to Admission medications   Medication Sig Start Date End Date Taking? Authorizing Provider  ACCU-CHEK AVIVA PLUS test strip 3 (three) times daily. 11/24/19  Yes [provider]  acetaminophen (TYLENOL) 325 MG tablet Take 2 tablets (650 mg total) by mouth every 4 (four) hours as needed for headache or mild pain. 12/27/19  Yes Shirley Friar, PA-C    albuterol (PROVENTIL HFA;VENTOLIN HFA) 108 (90 Base) MCG/ACT inhaler Inhale 1-2 puffs into the lungs every 6 (six) hours as needed for wheezing or shortness of breath. 07/30/18  Yes Drenda Freeze, MD  amiodarone (PACERONE) 200 MG tablet Take 2 tablets by mouth for 7 days.  Then reduce to 1 tablet by mouth daily thereafter. Patient taking differently: Take 200 mg by mouth daily.  01/09/20  Yes Evans Lance, MD  apixaban (ELIQUIS) 5 MG TABS tablet Take 1 tablet (5 mg total) by mouth 2 (two) times daily. 01/26/20  Yes Lyda Jester M, PA-C  Biotin 10 MG CAPS Take 10 mg by mouth daily.   Yes [provider]  carvedilol (COREG) 6.25 MG tablet TAKE 1 TABLET(6.25 MG) BY MOUTH TWICE DAILY WITH A MEAL Patient taking differently: Take 6.25 mg by mouth 2 (two) times daily with a meal.  01/24/20  Yes Larey Dresser, MD  Cholecalciferol (VITAMIN D-3) 25 MCG (1000 UT) CAPS Take 1,000 Units by mouth daily.    Yes [provider]  dapagliflozin propanediol (FARXIGA) 10 MG TABS tablet Take 1 tablet (10 mg total) by mouth daily. 01/22/20  Yes Larey Dresser, MD  folic acid (FOLVITE) 1 MG tablet Take 1 tablet (1 mg total) by mouth daily. 12/27/19  Yes Shirley Friar, PA-C  gabapentin (NEURONTIN) 600 MG tablet Take 600 mg by mouth 2 (two) times daily.   Yes [provider]  HYDROcodone-acetaminophen (NORCO) 10-325 MG tablet Take 1 tablet by mouth 4 (four) times daily.    Yes [provider]  lovastatin (MEVACOR) 10 MG tablet Take 10 mg by mouth at bedtime.   Yes [provider]  metFORMIN (GLUCOPHAGE) 500 MG tablet Take 500 mg by mouth 2 (two) times daily. 07/26/19  Yes [provider]  methotrexate (RHEUMATREX) 10 MG tablet Take 1 tablet (10 mg total) by mouth once a week. Caution:Chemotherapy. Protect from light. 01/22/20  Yes Larey Dresser, MD  Multiple Vitamins-Minerals (CENTRUM SILVER 50+WOMEN) TABS Take 1 tablet by mouth daily.   Yes  [provider]  omeprazole (PRILOSEC) 40 MG capsule Take 40 mg by mouth daily.  Yes [provider]  ondansetron (ZOFRAN ODT) 4 MG disintegrating tablet Take 1 tablet (4 mg total) by mouth every 8 (eight) hours as needed for nausea or vomiting. 10/20/19  Yes Dykstra, Ellwood Dense, MD  pravastatin (PRAVACHOL) 10 MG tablet Take 10 mg by mouth daily.   Yes [provider]  predniSONE (DELTASONE) 20 MG tablet Take 2 tablets (40 mg total) by mouth daily with breakfast. 01/30/20  Yes Larey Dresser, MD  sacubitril-valsartan (ENTRESTO) 49-51 MG Take 1 tablet by mouth 2 (two) times daily. 01/22/20  Yes Larey Dresser, MD  spironolactone (ALDACTONE) 25 MG tablet Take 1 tablet (25 mg total) by mouth daily. 01/22/20  Yes Larey Dresser, MD  acyclovir (ZOVIRAX) 800 MG tablet Take 800 mg by mouth 5 (five) times daily. 01/09/20   [provider]  digoxin (LANOXIN) 0.125 MG tablet Take 1 tablet (0.125 mg total) by mouth daily for 7 days. 01/09/20 01/16/20  Evans Lance, MD  doxycycline (VIBRA-TABS) 100 MG tablet Take 100 mg by mouth 2 (two) times daily. 01/09/20   [provider]  fluconazole (DIFLUCAN) 100 MG tablet Take 1 tablet (100 mg total) by mouth daily. 01/11/20   Michel Bickers, MD  mexiletine (MEXITIL) 250 MG capsule Take 1 capsule (250 mg total) by mouth every 12 (twelve) hours for 7 days. 01/09/20 01/16/20  Evans Lance, MD     Critical care time: n/a     Roselie Awkward, MD [place] [facility] Pager: [phone] Cell: [phone] If no response, call [phone]

## 2020-02-06 NOTE — Consult Note (Addendum)
Advanced Heart Failure Team Consult Note   Primary Physician: Milford Cage, PA PCP-Cardiologist:  Dr Haroldine Laws EP : Dr Lovena Le  Reason for Consultation: Heart Failure   HPI:    Lisa Poole is seen today for evaluation of heart failure at the request of Dr Lovena Le.    Mrs  Vantol is a 54 year old with a history of HTN, HLD, T2DM, CHB s/p PPM implant 02/2019 followed by Dr. Lovena Le, PAF (not on a/c), aortic insufficiency,  COVID 19 0/9735 chronic systolic heart failure, NICM, VT, and CHB.    In May 2020, she underwent w/u for SOB and mildly elevated troponin.LHC 09/2018 showed no significant coronary disease.Echo 09/2018. LVEF 60-65%, mild LVH, RV normal. Mild- mod AI. No stenosis.  In 02/2019, she presented w/ chest pain and found to be in CHB and underwentPPMinplant ([employer]). She is followed by Dr. Lovena Le. EP notes outline detection of PAF but episodes have been very brief, thus she has not been on anticoagulation.   In April 2021, she was diagnosed w/ COVID but did not require hospitalization. She has not been vaccinated.   Admitted 07/2990 with A/C Systolic heart failure. EKG and tele w/ WCT, felt to be VT.  Started on IV amiodarone and lasix gtt.Echowasrepeated and showednew drop in LVEF, down to 20-25%. Hospital course complicated by cardiogenic shock. Placed on milrinone and IABP. Developed Torsades/VT x 3 on 7/24 with prolonged QT required DC-CV. Amio switched to lido. Lido stopped 7/26 by Dr. Caryl Comes. Started on mexiletine. Had repeat LHC that showed no significant coronary disease. cMRI c/w possible cardiac sarcoidosis vs prior myocarditis. She was placed on empiric prednisone for possible sarcoid and referred to [facility] for cardiac PET scan. Received 1 gram solumedrol IV x 5 days followed by prednisone. Dr Caryl Comes start methotrexate 10 mg weekly. For VT plan was to continue nn Mexiletine for maintenance therapy. She underwent device upgrade to  CRT-D prior to d/c.  Discharged to home on 12/26/19   Followed in the HF clinic and was last seen 12/15/19 for post hospital visit. Coreg was increased to 6.25 mg twice a day.  Stable from HF perspective.  She was set up for PET scan.  Sarcoid Treatment - 12/20/19 MTX 10 mg weekly  - 12/20/19 Received 1 gram solumedrol IV x 5 days  - 8/16 Prednisone 60 mg daily - 01/30/20 Prednisone cut back to 40 mg daily   Prior to admit she was taking methotrexate 10 mg once a week + prednisone 40 mg daily.  Says she has been extremely tired with no energy.  Uses and an electric cart in the grocery store. Appetite has been ok. Weight at home has been stable.   Yesterday she presented to ED after ICD shock x2. Says she was in the bathroom sitting on the commode and was shocked x1 then about 3 minutes later shocked again.  Of note, Mexitil and digoxin stopped 01/16/20 due to nausea and amio was restarted. Device interrogation confirmed appropriate shock for VT.  Pertinent admission labs included: Mag 1.9, WBC 8, glucose 446, creatinine 1.3, BNP 334, and Sars 2 negative.  Placed on amio drip with plans to continue for 48 hours.   Cardiac Testing  01/26/2020 Cardiac PET with concurrent CT was performed.  Myocardial perfusion Rb-82 imaging demonstrates perfusion defects in 5 segments.  Metabolic imaging performed with FDG PET and demonstrates mild to moderate abnormal uptake in the 5/17 segments  demonstrating perfusion abnormalities.  FDG PET/CT chest and  upper abdomen: no evidence of metabolically active sarcoid in the lymph nodes, no metabolically active  pulmonary opacities. Calcified pulmonary nodules, hilar lymph nodes and calcifications in the spleen are consistent with  sequela of granulomatous disease.  Impression:  1. Findings in the myocardium are consistent with active inflammation.  2. No evidence of metabolically active extracardiac sarcoidosis, granulomatous disease in the chest and abdomen consistent withtreated  granulomas.    11/2019 CMRI - showed patchy LGE in the basal septum, basal-mid inferoseptum/inferior wall, basal anterior wall, mid anterolateral wall. Cardiac MRI LGE pattern could be consistent with cardiac sarcoidosis   09/2018 EF 60-65%, mild LVH, RV normal. Mild- mod AI. No stenosis 11/2019 EF 20-25% with septal-lateral dyssynchrony and septal severe hypokinesis, normal RV, moderate TR, moderate MR Review of Systems: [y] = yes, [ ]  = no   . General: Weight gain [ ] ; Weight loss [ ] ; Anorexia [ ] ; Fatigue [Y ]; Fever [ ] ; Chills [ ] ; Weakness [Y ]  . Cardiac: Chest pain/pressure [ ] ; Resting SOB [ ] ; Exertional SOB [ ] ; Orthopnea [ ] ; Pedal Edema [ ] ; Palpitations [ ] ; Syncope [ Y]; Presyncope [ ] ; Paroxysmal nocturnal dyspnea[ ]   . Pulmonary: Cough [ ] ; Wheezing[ ] ; Hemoptysis[ ] ; Sputum [ ] ; Snoring [ ]   . GI: Vomiting[ ] ; Dysphagia[ ] ; Melena[ ] ; Hematochezia [ ] ; Heartburn[ ] ; Abdominal pain [ ] ; Constipation [ ] ; Diarrhea [ ] ; BRBPR [ ]   . GU: Hematuria[ ] ; Dysuria [ ] ; Nocturia[ ]   . Vascular: Pain in legs with walking [ ] ; Pain in feet with lying flat [ ] ; Non-healing sores [ ] ; Stroke [ ] ; TIA [ ] ; Slurred speech [ ] ;  . Neuro: Headaches[ ] ; Vertigo[ ] ; Seizures[ ] ; Paresthesias[ ] ;Blurred vision [ ] ; Diplopia [ ] ; Vision changes [ ]   . Ortho/Skin: Arthritis [ ] ; Joint pain [Y ]; Muscle pain [ ] ; Joint swelling [ ] ; Back Pain [ ] ; Rash [ ]   . Psych: Depression[ ] ; Anxiety[ ]   . Heme: Bleeding problems [ ] ; Clotting disorders [ ] ; Anemia [ ]   . Endocrine: Diabetes [Y ]; Thyroid dysfunction[ ]   Home Medications Prior to Admission medications   Medication Sig Start Date End Date Taking? Authorizing Provider  acetaminophen (TYLENOL) 325 MG tablet Take 2 tablets (650 mg total) by mouth every 4 (four) hours as needed for headache or mild pain. 12/27/19  Yes Shirley Friar, PA-C  albuterol (PROVENTIL HFA;VENTOLIN HFA) 108 (90 Base) MCG/ACT inhaler Inhale 1-2 puffs into the lungs  every 6 (six) hours as needed for wheezing or shortness of breath. 07/30/18  Yes Drenda Freeze, MD  amiodarone (PACERONE) 200 MG tablet Take 2 tablets by mouth for 7 days.  Then reduce to 1 tablet by mouth daily thereafter. Patient taking differently: Take 200 mg by mouth daily.  01/09/20  Yes Evans Lance, MD  apixaban (ELIQUIS) 5 MG TABS tablet Take 1 tablet (5 mg total) by mouth 2 (two) times daily. 01/26/20  Yes Simmons, Brittainy M, PA-C  carvedilol (COREG) 6.25 MG tablet TAKE 1 TABLET(6.25 MG) BY MOUTH TWICE DAILY WITH A MEAL 01/24/20  Yes Larey Dresser, MD  dapagliflozin propanediol (FARXIGA) 10 MG TABS tablet Take 1 tablet (10 mg total) by mouth daily. 01/22/20  Yes Larey Dresser, MD  pravastatin (PRAVACHOL) 10 MG tablet Take 10 mg by mouth daily.   Yes [provider]  predniSONE (DELTASONE) 20 MG tablet Take 2 tablets (40 mg total) by mouth daily with  breakfast. 01/30/20  Yes Larey Dresser, MD  sacubitril-valsartan (ENTRESTO) 49-51 MG Take 1 tablet by mouth 2 (two) times daily. 01/22/20  Yes Larey Dresser, MD  spironolactone (ALDACTONE) 25 MG tablet Take 1 tablet (25 mg total) by mouth daily. 01/22/20  Yes Larey Dresser, MD  ACCU-CHEK AVIVA PLUS test strip 3 (three) times daily. 11/24/19   [provider]  acyclovir (ZOVIRAX) 800 MG tablet Take 800 mg by mouth 5 (five) times daily. 01/09/20   [provider]  Biotin 10 MG CAPS Take 10 mg by mouth daily.    [provider]  cetirizine (ZYRTEC) 10 MG tablet Take 10 mg by mouth daily.    [provider]  Cholecalciferol (VITAMIN D-3) 25 MCG (1000 UT) CAPS Take 1,000 Units by mouth daily.     [provider]  digoxin (LANOXIN) 0.125 MG tablet Take 1 tablet (0.125 mg total) by mouth daily for 7 days. 01/09/20 01/16/20  Evans Lance, MD  doxycycline (VIBRA-TABS) 100 MG tablet Take 100 mg by mouth 2 (two) times daily. 01/09/20   [provider]  fluconazole (DIFLUCAN) 100  MG tablet Take 1 tablet (100 mg total) by mouth daily. 01/11/20   Michel Bickers, MD  folic acid (FOLVITE) 1 MG tablet Take 1 tablet (1 mg total) by mouth daily. 12/27/19   Shirley Friar, PA-C  gabapentin (NEURONTIN) 600 MG tablet Take 600 mg by mouth 2 (two) times daily.    [provider]  HYDROcodone-acetaminophen (NORCO) 10-325 MG tablet Take 1 tablet by mouth 4 (four) times daily.     [provider]  lovastatin (MEVACOR) 10 MG tablet Take 10 mg by mouth at bedtime.    [provider]  metFORMIN (GLUCOPHAGE) 500 MG tablet Take 500 mg by mouth 2 (two) times daily. 07/26/19   [provider]  methotrexate (RHEUMATREX) 10 MG tablet Take 1 tablet (10 mg total) by mouth once a week. Caution:Chemotherapy. Protect from light. 01/22/20   Larey Dresser, MD  mexiletine (MEXITIL) 250 MG capsule Take 1 capsule (250 mg total) by mouth every 12 (twelve) hours for 7 days. 01/09/20 01/16/20  Evans Lance, MD  Multiple Vitamins-Minerals (CENTRUM SILVER 50+WOMEN) TABS Take 1 tablet by mouth daily.    [provider]  naloxone [facility]) 4 MG/0.1ML LIQD nasal spray kit Place 1 spray into the nose as directed.    [provider]  omeprazole (PRILOSEC) 40 MG capsule Take 40 mg by mouth daily.    [provider]  ondansetron (ZOFRAN ODT) 4 MG disintegrating tablet Take 1 tablet (4 mg total) by mouth every 8 (eight) hours as needed for nausea or vomiting. 10/20/19   Lucrezia Starch, MD    Past Medical History: Past Medical History:  Diagnosis Date  . Aortic insufficiency   . Asthma 09/13/2006  . Complete heart block ([place]) 02/11/2019  . Cystocele without uterine prolapse 12/12/2018  . Diabetes mellitus without complication ([place])   . Elevated troponin level   . Hypercholesteremia   . Hypertension   . Primary osteoarthritis of left knee 12/10/2017    Past Surgical History: Past Surgical History:  Procedure Laterality Date  . APPENDECTOMY    .  BIV ICD INSERTION CRT-D N/A 12/07/2019   Procedure: BIV ICD INSERTION CRT-D;  Surgeon: Evans Lance, MD;  Location: [place] [facility];  Service: Cardiovascular;  Laterality: N/A;  . IABP INSERTION N/A 12/01/2019   Procedure: IABP Insertion;  Surgeon: Loralie Champagne  S, MD;  Location: [place] [facility];  Service: Cardiovascular;  Laterality: N/A;  . LEFT HEART CATH AND CORONARY ANGIOGRAPHY N/A 09/28/2018   Procedure: LEFT HEART CATH AND CORONARY ANGIOGRAPHY;  Surgeon: Troy Sine, MD;  Location: [place] [facility];  Service: Cardiovascular;  Laterality: N/A;  . PACEMAKER IMPLANT N/A 02/13/2019   Procedure: PACEMAKER IMPLANT;  Surgeon: Evans Lance, MD;  Location: [place] [facility];  Service: Cardiovascular;  Laterality: N/A;  . PACEMAKER IMPLANT    . RIGHT/LEFT HEART CATH AND CORONARY ANGIOGRAPHY N/A 12/01/2019   Procedure: RIGHT/LEFT HEART CATH AND CORONARY ANGIOGRAPHY;  Surgeon: Larey Dresser, MD;  Location: [place] [facility];  Service: Cardiovascular;  Laterality: N/A;  . ROTATOR CUFF REPAIR Bilateral 2017  . SINUS EXPLORATION    . TOTAL KNEE ARTHROPLASTY Left 12/10/2017   Procedure: LEFT TOTAL KNEE ARTHROPLASTY;  Surgeon: Sydnee Cabal, MD;  Location: [facility];  Service: Orthopedics;  Laterality: Left;  Adductor Block  . TUBAL LIGATION      Family History: Family History  Problem Relation Age of Onset  . Colon cancer Maternal Grandmother   . Alzheimer's disease Mother     Social History: Social History   Socioeconomic History  . Marital status: Married    Spouse name: Not on file  . Number of children: Not on file  . Years of education: Not on file  . Highest education level: Not on file  Occupational History  . Not on file  Tobacco Use  . Smoking status: Never Smoker  . Smokeless tobacco: Never Used  Vaping Use  . Vaping Use: Never used  Substance and Sexual Activity  . Alcohol use: No  . Drug use: No  . Sexual activity: Yes    Birth control/protection:  Post-menopausal  Other Topics Concern  . Not on file  Social History Narrative  . Not on file   Social Determinants of Health   Financial Resource Strain:   . Difficulty of Paying Living Expenses: Not on file  Food Insecurity:   . Worried About Charity fundraiser in the Last Year: Not on file  . Ran Out of Food in the Last Year: Not on file  Transportation Needs:   . Lack of Transportation (Medical): Not on file  . Lack of Transportation (Non-Medical): Not on file  Physical Activity:   . Days of Exercise per Week: Not on file  . Minutes of Exercise per Session: Not on file  Stress:   . Feeling of Stress : Not on file  Social Connections:   . Frequency of Communication with Friends and Family: Not on file  . Frequency of Social Gatherings with Friends and Family: Not on file  . Attends Religious Services: Not on file  . Active Member of Clubs or Organizations: Not on file  . Attends Archivist Meetings: Not on file  . Marital Status: Not on file    Allergies:  Allergies  Allergen Reactions  . Lisinopril Other (See Comments) and Cough    Flu like symptoms, also   . Ace Inhibitors Other (See Comments)    Flu-like symptoms  . Augmentin [Amoxicillin-Pot Clavulanate] Nausea And Vomiting  . Benazepril Other (See Comments)    Flu like symptoms  . Oxycodone-Acetaminophen Nausea And Vomiting  . Sulfa Antibiotics Nausea And Vomiting    Objective:    Vital Signs:   Temp:  [97.6 F (36.4 C)-98 F (36.7 C)] 97.7 F (36.5 C) (09/28 0745) Pulse Rate:  [  93-98] 98 (09/28 0322) Resp:  [13-32] 18 (09/28 0745) BP: (94-131)/(67-108) 131/108 (09/28 0745) SpO2:  [95 %-98 %] 98 % (09/28 0322) Weight:  [74.8 kg] 74.8 kg (09/28 0322) Last BM Date: 02/05/20  Weight change: Filed Weights   02/06/20 0322  Weight: 74.8 kg    Intake/Output:  No intake or output data in the 24 hours ending 02/06/20 0900    Physical Exam    General:  Well appearing. No resp  difficulty HEENT: normal Neck: supple. JVP flat  Carotids 2+ bilat; no bruits. No lymphadenopathy or thyromegaly appreciated. Cor: PMI nondisplaced. Regular rate & rhythm. No rubs, gallops or murmurs. Lungs: clear Abdomen: soft, nontender, nondistended. No hepatosplenomegaly. No bruits or masses. Good bowel sounds. Extremities: no cyanosis, clubbing, rash, edema Neuro: alert & orientedx3, cranial nerves grossly intact. moves all 4 extremities w/o difficulty. Affect pleasant   Telemetry   SR 70s with occasional PVCs.   EKG    SR 96 ms QRS 120 ms   Labs   Basic Metabolic Panel: Recent Labs  Lab 02/05/20 2307  NA 134*  K 4.9  CL 97*  CO2 22  GLUCOSE 446*  BUN 18  CREATININE 1.34*  CALCIUM 9.8  MG 1.9    Liver Function Tests: No results for input(s): AST, ALT, ALKPHOS, BILITOT, PROT, ALBUMIN in the last 168 hours. No results for input(s): LIPASE, AMYLASE in the last 168 hours. No results for input(s): AMMONIA in the last 168 hours.  CBC: Recent Labs  Lab 02/05/20 2307  WBC 8.0  NEUTROABS 7.0  HGB 14.7  HCT 47.4*  MCV 89.9  PLT 257    Cardiac Enzymes: No results for input(s): CKTOTAL, CKMB, CKMBINDEX, TROPONINI in the last 168 hours.  BNP: BNP (last 3 results) Recent Labs    11/26/19 2011 02/06/20 0725  BNP 1,258.1* 344.3*    ProBNP (last 3 results) No results for input(s): PROBNP in the last 8760 hours.   CBG: Recent Labs  Lab 02/06/20 0741  GLUCAP 141*    Coagulation Studies: No results for input(s): LABPROT, INR in the last 72 hours.   Imaging    No results found.   Medications:     Current Medications: . apixaban  5 mg Oral BID  . carvedilol  6.25 mg Oral BID WC  . Chlorhexidine Gluconate Cloth  6 each Topical Q0600  . dapagliflozin propanediol  10 mg Oral Daily  . [START ON 02/07/2020] influenza vac split quadrivalent PF  0.5 mL Intramuscular Tomorrow-1000  . mupirocin ointment  1 application Nasal BID  . [START ON  02/07/2020] pneumococcal 23 valent vaccine  0.5 mL Intramuscular Tomorrow-1000  . predniSONE  40 mg Oral Q breakfast  . sacubitril-valsartan  1 tablet Oral BID     Infusions: . amiodarone 60 mg/hr (02/06/20 0814)   Followed by  . amiodarone        Assessment/Plan  1. Recurrent VT Appropriate ICD shock for VT x2.  On amio drip with plans to continue for 48 hours.   2. Chronic Systolic Heart Failure Echo 7/21with EF newly decreased to 20-25% with septal-lateral dyssynchrony and septal severe hypokinesis, normal RV, moderate TR, moderate MR. Echo in 5/20 prior to PPM placement showed EF 60-65%. Possible causes of cardiomyopathy included cardiac sarcoidosis,chronic RV pacing, frequent NSVT/PVCs, ?CAD (has RFs), ?viral myocarditis (relatively recent COVID-19 infection). No ETOH/drugs.  LHC/RHC 7/23 with no significant CAD, elevated filling pressures, and low cardiac output on norepinephrine. IABP placed. IABP removed 7/26. cMRI 7/27 showed  patchy LGE in the basal septum, basal-mid inferoseptum/inferior wall, basal anterior wall, mid anterolateral wall. Cardiac MRI LGE pattern could be consistent with cardiac sarcoidosis (would explain earlier CHB).  -Cardiac Pet completed and suggestive of cardiac sarcoid. - She is now s/p device upgrade to MDT CRT-D. - Repeat ECHO today.  - Continue coreg 6.25 mg twice a day + entresto 49-51 mg twice a day.  - off digoxin due to nausea per EP. - May need RHC with extreme fatigue. Difficult because if she is low output inotropes would be challenging with VT.  -May need to restart digoxin but will need to review liteature given active sarcoid.   3.  Cardiac Sarcoid - Cardiac Pet 9//17/21 suggestive of cardiac sarcoid, active inflammation , and extra cardiac extracardiac sarcoidosis, granulomatous disease in the chest and abdomen consistent withtreated granulomas.   -Sarcoid Treatment to date - 12/20/19 MTX 10 mg weekly next dose due  9/29  -  12/20/19 Received 1 gram solumedrol IV x 5 days  - 8/16 Prednisone 60 mg daily - 01/30/20 Prednisone cut back to 40 mg daily  - Will discuss with pulmonary + pharmacy - Will need another Cardiac PET scan in 6 months.  - Discuss with pulmonary.   4. CHB:  s/p PPM 2/21>> had device upgrade to MDT CRT-D 7/21  5. H//O PAF Maintaining SR. On eliquis. Continue   6. H/O IJ DVT On eliquis   7. Uncontrolled DMII Glucose > 400 on admit  - On ssi - Hgb A1C 10  Length of Stay: 0  Amy Clegg, NP  02/06/2020, 9:00 AM  Advanced Heart Failure Team Pager [phone] (M-F; [place])  Please contact [facility] for night-coverage after hours (4p -7a ) and weekends on [email or web address]  Patient seen with NP, agree with the above note.   She returns to the hospital with recurrent VT and ICD discharges x 2 last night.  Last week, we had decreased her prednisone after 1 month from 60 mg daily to 40 mg daily.    Cardiac PET scan done recently showed cardiac inflammation, concerning for cardiac sarcoidosis.  There was granulomatous disease noted in the chest and abdomen without evidence for active inflammation.   She denies dyspnea walking around the house or with ADLs.  No orthopnea/PND.  She does fatigue easily and uses a motorized cart in the grocery store.  Blood glucose has been poorly controlled on steroids.  She has been taking all her cardiac meds. She had nausea a few weeks ago but improved off mexiletine.   General: NAD Neck: No JVD, no thyromegaly or thyroid nodule.  Lungs: Clear to auscultation bilaterally with normal respiratory effort. CV: Nondisplaced PMI.  Heart regular S1/S2, no S3/S4, no murmur.  No peripheral edema.  No carotid bruit.  Normal pedal pulses.  Abdomen: Soft, nontender, no hepatosplenomegaly, no distention.  Skin: Intact without lesions or rashes.  Neurologic: Alert and oriented x 3.  Psych: Normal affect. Extremities: No clubbing or cyanosis.  HEENT: Normal.    1.Chronicsystolic CHF: Nonischemic cardiomyopathy. Echoin 7/21with EF newly decreased to 20-25% with septal-lateral dyssynchrony and septal severe hypokinesis, normal RV, moderate TR, moderate MR. Echo in 5/20 prior to PPM placement showed EF 60-65%. She developed shock in 7/21 requiring pressors/inotropes and IABP.LHC/RHC 12/01/19 with no significant CAD, elevated filling pressures, and low cardiac output.  cMRI 12/05/19 showed patchy LGE in the basal septum, basal-mid inferoseptum/inferior wall, basal anterior wall, mid anterolateral wall. Cardiac MRI LGE pattern could be consistent  with cardiac sarcoidosis (would explain earlier CHB). Alternatively, prior viral myocarditis (recent COVID-19). CT chest with ground glass lower lobes but no evidence for pulmonary sarcoidosis. She is now s/p device upgrade to MDT CRT-D.Cardiac PET in 8/21 was showed active inflammation in the heart, concerning again for cardiac sarcoidosis.  She is back in with VT.  She denies dyspnea but does note exertional fatigue.  She is not volume overloaded on exam.  - Would restart digoxin 0.125, think mexiletine was the culprit for nausea.  - Continue spironolactone 25 mg daily, watch K (was 4.9 at admission).  - Continue Entresto 49/51 mg bid.  - Continue Coreg6.25 mg bid. If she has BP room, would increase given VT.  -Continuedapagliflozin 10 mg daily.  - Would repeat RHC this admission after stabilized (aim for Thursday morning) to reassess cardiac output (given fatigue, concern for possible low output HF).  2. Cardiac sarcoidosis: Strongly suspected.  Cardiac MRI and cardiac PET both suggestive of cardiac sarcoidosis, and unexplained CHB also concerning, as is VT.  She had more VT prior to this admission after decreasing prednisone from 60 mg daily to 40 mg daily.  - Will give her a pulse of Solumedrol 1 g IV today (had 3 days of Solumedrol last admission) and then prednisone 60 mg daily. She will be on  prednisone long-term, will need prophylaxis for infection.  - Continue methotrexate once a week. Will need to follow CBC, LFTs.  - Will ask pulmonary to assess => ?any sign of extracardiac sarcoid that would be amenable to biopsy to make definitive diagnosis.  Cardiac PET reports granulomatous disease in chest and abdomen though no active inflammation.  3.Recurrent ventricular arrhythmias:She has hadVT with multiple morphologies. Increased ventricular arrhythmias over 6 months. No coronary disease on recent cath. Suspect as above that she has cardiac sarcoidosis (would also explain CHB). Amiodarone stopped in the past due to torsades and very long QTc on amiodarone.  Recurrent ventricular arrhythmias now twice after tapering prednisone.  - EP following, re-trying her on amidoarone.  Follow QT interval.  - Titrate up Coreg if able. - Looks like she will need very slow prednisone taper.  Will increase back to 60 mg daily after Solumedrol pulse today. 4. H/o CHB: Suspect due to cardiac sarcoidosis.  Now s/p CRT.  5. Rt IJ DVT: likely from prior central line.   - Will need anticoagulation for 3 months => Eliquis.  6. Type 2 diabetes: Glucose running high on steroids.  She has long-standing diabetes.   - Will ask for diabetes coordinator to follow and will adjust meds.  - She will benefit from endocrinology as outpatient, she will be on prednisone for at least a year.   Loralie Champagne 02/06/2020 1:16 PM

## 2020-02-06 NOTE — H&P (Signed)
Cardiology Admission History and Physical:   Patient ID: Lisa Poole; MRN: [id number]; DOB: 1965-12-12   Admission date: 02/05/2020  Primary Care Provider: Milford Cage, PA Primary Cardiologist:  Loralie Champagne, MD Electrophysiologist: Cristopher Peru, MD  Chief Complaint:  Recurrent Ventricular tachycardia  History of Present Illness:   Lisa Poole is a 54 y.o. female with a history of CHF (? LV non-compaction vs Sarcoidosis, s/p recent PET scan at [facility], currently on Methotrexate and prednisone), recurrent VT (s/p BiV ICD), recent Rt IJ DVT (started on Eliquis), paroxysmal atrial fibrillation, DM, hyperlipidemia and osteoarthritis, who is admitted to the hospital after her ICD fired twice last night. The patient denied any prior chest pain or shortness of breath. She has not had any recent weight gain or exacerbation of her heart failure. She is compliant with her cardiac medications. At the time the ICD delivered the shocks she was sitting down and not exerting herself. Device interrogation in the ED revealed appropriate therapies.  She recently underwent a PET scan and was scheduled to see Dr. Aundra Dubin on 02/06/2020 to review the results of the study.  Recent events: 09/2018 - Echo EF 60-65%, mild LVH, RV normal. Mild- mod AI. No stenosis 02/2019 - developed complete heart block, s/p PPM 08/2019 - COVID 19 infection, did not require hospitalization 11/26/2019 - acute SOB, acute heart failure, tele showing episodes of VT, Amio for suppression 11/2019 - Echo EF newly decreased to 20-25% with septal-lateral dyssynchrony and septal severe hypokinesis, normal RV, moderate TR, moderate MR 11/28/2019 - cardiogenic shock requiring pressors 12/01/2019 - IABP placement, LHC/RHC with no significant CAD, elevated filling pressures, and low cardiac output on norepinephrine 12/02/2019 - Torsades/VT x 3 with prolonged QT required DC-CV. Amio switched to lido.  12/04/2019 - IABP removed, hematoma right  groin, Lidocaine stopped 12/05/2019 - CMR showed patchy LGE in the basal septum, basal-mid inferoseptum/inferior wall, basal anterior wall, mid anterolateral wall. Cardiac MRI LGE pattern could be consistent with cardiac sarcoidosis  12/19/2019 - admitted with prolonged QT and ventricular tachycardia. PPM changed to BiV ICD. 12/2019 - Right groin infection, MRSA on culture, treated with Linezolid, Rt IJ DVT (placed on Eliquis) 01/08/2020 - episode of shingles, given acyclovir and doxycycline Sep 2021 - Mexitil stopped due to nausea, Amio restarted    Past Medical History:  Diagnosis Date   Aortic insufficiency    Asthma 09/13/2006   Complete heart block ([place]) 02/11/2019   Cystocele without uterine prolapse 12/12/2018   Diabetes mellitus without complication (HCC)    Elevated troponin level    Hypercholesteremia    Hypertension    Primary osteoarthritis of left knee 12/10/2017    Past Surgical History:  Procedure Laterality Date   APPENDECTOMY     BIV ICD INSERTION CRT-D N/A 12/07/2019   Procedure: BIV ICD INSERTION CRT-D;  Surgeon: Evans Lance, MD;  Location: [place] [facility];  Service: Cardiovascular;  Laterality: N/A;   IABP INSERTION N/A 12/01/2019   Procedure: IABP Insertion;  Surgeon: Larey Dresser, MD;  Location: [place] [facility];  Service: Cardiovascular;  Laterality: N/A;   LEFT HEART CATH AND CORONARY ANGIOGRAPHY N/A 09/28/2018   Procedure: LEFT HEART CATH AND CORONARY ANGIOGRAPHY;  Surgeon: Troy Sine, MD;  Location: [place] [facility];  Service: Cardiovascular;  Laterality: N/A;   PACEMAKER IMPLANT N/A 02/13/2019   Procedure: PACEMAKER IMPLANT;  Surgeon: Evans Lance, MD;  Location: [place] [facility];  Service: Cardiovascular;  Laterality: N/A;   PACEMAKER  IMPLANT     RIGHT/LEFT HEART CATH AND CORONARY ANGIOGRAPHY N/A 12/01/2019   Procedure: RIGHT/LEFT HEART CATH AND CORONARY ANGIOGRAPHY;  Surgeon: Larey Dresser, MD;  Location: [place] [facility];   Service: Cardiovascular;  Laterality: N/A;   ROTATOR CUFF REPAIR Bilateral 2017   SINUS EXPLORATION     TOTAL KNEE ARTHROPLASTY Left 12/10/2017   Procedure: LEFT TOTAL KNEE ARTHROPLASTY;  Surgeon: Sydnee Cabal, MD;  Location: [facility];  Service: Orthopedics;  Laterality: Left;  Adductor Block   TUBAL LIGATION       Medications Prior to Admission: Prior to Admission medications   Medication Sig Start Date End Date Taking? Authorizing Provider  ACCU-CHEK AVIVA PLUS test strip 3 (three) times daily. 11/24/19   [provider]  acetaminophen (TYLENOL) 325 MG tablet Take 2 tablets (650 mg total) by mouth every 4 (four) hours as needed for headache or mild pain. 12/27/19   Shirley Friar, PA-C  acyclovir (ZOVIRAX) 800 MG tablet Take 800 mg by mouth 5 (five) times daily. 01/09/20   [provider]  albuterol (PROVENTIL HFA;VENTOLIN HFA) 108 (90 Base) MCG/ACT inhaler Inhale 1-2 puffs into the lungs every 6 (six) hours as needed for wheezing or shortness of breath. 07/30/18   Drenda Freeze, MD  amiodarone (PACERONE) 200 MG tablet Take 2 tablets by mouth for 7 days.  Then reduce to 1 tablet by mouth daily thereafter. 01/09/20   Evans Lance, MD  apixaban (ELIQUIS) 5 MG TABS tablet Take 1 tablet (5 mg total) by mouth 2 (two) times daily. 01/26/20   Consuelo Pandy, PA-C  Biotin 10 MG CAPS Take 10 mg by mouth daily.    [provider]  carvedilol (COREG) 6.25 MG tablet TAKE 1 TABLET(6.25 MG) BY MOUTH TWICE DAILY WITH A MEAL 01/24/20   Larey Dresser, MD  cetirizine (ZYRTEC) 10 MG tablet Take 10 mg by mouth daily.    [provider]  Cholecalciferol (VITAMIN D-3) 25 MCG (1000 UT) CAPS Take 1,000 Units by mouth daily.     [provider]  dapagliflozin propanediol (FARXIGA) 10 MG TABS tablet Take 1 tablet (10 mg total) by mouth daily. 01/22/20   Larey Dresser, MD  digoxin (LANOXIN) 0.125 MG tablet Take 1 tablet (0.125 mg total) by mouth daily  for 7 days. 01/09/20 01/16/20  Evans Lance, MD  doxycycline (VIBRA-TABS) 100 MG tablet Take 100 mg by mouth 2 (two) times daily. 01/09/20   [provider]  fluconazole (DIFLUCAN) 100 MG tablet Take 1 tablet (100 mg total) by mouth daily. 01/11/20   Michel Bickers, MD  folic acid (FOLVITE) 1 MG tablet Take 1 tablet (1 mg total) by mouth daily. 12/27/19   Shirley Friar, PA-C  gabapentin (NEURONTIN) 600 MG tablet Take 600 mg by mouth 2 (two) times daily.    [provider]  HYDROcodone-acetaminophen (NORCO) 10-325 MG tablet Take 1 tablet by mouth 4 (four) times daily.     [provider]  metFORMIN (GLUCOPHAGE) 500 MG tablet Take 500 mg by mouth 2 (two) times daily. 07/26/19   [provider]  methotrexate (RHEUMATREX) 10 MG tablet Take 1 tablet (10 mg total) by mouth once a week. Caution:Chemotherapy. Protect from light. 01/22/20   Larey Dresser, MD  mexiletine (MEXITIL) 250 MG capsule Take 1 capsule (250 mg total) by mouth every 12 (twelve) hours for 7 days. 01/09/20 01/16/20  Evans Lance, MD  Multiple Vitamins-Minerals (CENTRUM SILVER 50+WOMEN) TABS Take  1 tablet by mouth daily.    [provider]  naloxone [facility]) 4 MG/0.1ML LIQD nasal spray kit Place 1 spray into the nose as directed.    [provider]  omeprazole (PRILOSEC) 40 MG capsule Take 40 mg by mouth daily.    [provider]  ondansetron (ZOFRAN ODT) 4 MG disintegrating tablet Take 1 tablet (4 mg total) by mouth every 8 (eight) hours as needed for nausea or vomiting. 10/20/19   Lucrezia Starch, MD  pravastatin (PRAVACHOL) 10 MG tablet Take 1 tablet (10 mg total) by mouth daily at 6 PM. 01/25/20   Larey Dresser, MD  predniSONE (DELTASONE) 20 MG tablet Take 2 tablets (40 mg total) by mouth daily with breakfast. 01/30/20   Larey Dresser, MD  sacubitril-valsartan (ENTRESTO) 49-51 MG Take 1 tablet by mouth 2 (two) times daily. 01/22/20   Larey Dresser, MD    spironolactone (ALDACTONE) 25 MG tablet Take 1 tablet (25 mg total) by mouth daily. 01/22/20   Larey Dresser, MD     Allergies:    Allergies  Allergen Reactions   Lisinopril Other (See Comments) and Cough    Flu like symptoms, also    Ace Inhibitors Other (See Comments)    Flu-like symptoms   Augmentin [Amoxicillin-Pot Clavulanate] Nausea And Vomiting   Benazepril Other (See Comments)    Flu like symptoms   Oxycodone-Acetaminophen Nausea And Vomiting   Sulfa Antibiotics Nausea And Vomiting    Social History:   Social History   Socioeconomic History   Marital status: Married    Spouse name: Not on file   Number of children: Not on file   Years of education: Not on file   Highest education level: Not on file  Occupational History   Not on file  Tobacco Use   Smoking status: Never Smoker   Smokeless tobacco: Never Used  Vaping Use   Vaping Use: Never used  Substance and Sexual Activity   Alcohol use: No   Drug use: No   Sexual activity: Yes    Birth control/protection: Post-menopausal  Other Topics Concern   Not on file  Social History Narrative   Not on file   Social Determinants of Health   Financial Resource Strain:    Difficulty of Paying Living Expenses: Not on file  Food Insecurity:    Worried About Running Out of Food in the Last Year: Not on file   [employer] of Food in the Last Year: Not on file  Transportation Needs:    Lack of Transportation (Medical): Not on file   Lack of Transportation (Non-Medical): Not on file  Physical Activity:    Days of Exercise per Week: Not on file   Minutes of Exercise per Session: Not on file  Stress:    Feeling of Stress : Not on file  Social Connections:    Frequency of Communication with Friends and Family: Not on file   Frequency of Social Gatherings with Friends and Family: Not on file   Attends Religious Services: Not on file   Active Member of Clubs or Organizations: Not on file    Attends Archivist Meetings: Not on file   Marital Status: Not on file  Intimate Partner Violence:    Fear of Current or Ex-Partner: Not on file   Emotionally Abused: Not on file   Physically Abused: Not on file   Sexually Abused: Not on file     Family History:  The  patient's family history includes Alzheimer's disease in her mother; Colon cancer in her maternal grandmother.     Review of Systems: [y] = yes, _0  = no    General: Weight gain _1 ; Weight loss _2 ; Anorexia _3 ; Fatigue _4 ; Fever _5 ; Chills _6 ; Weakness _7    Cardiac: Chest pain/pressure _8 ; Resting SOB _9 ; Exertional SOB _10 ; Orthopnea _11 ; Pedal Edema _12 ; Palpitations [Y]; Syncope _13 ; Presyncope _14 ; Paroxysmal nocturnal dyspnea_15    Pulmonary: Cough _16 ; Wheezing_17 ; Hemoptysis_18 ; Sputum _19 ; Snoring _20    GI: Vomiting_21 ; Dysphagia_22 ; Melena_23 ; Hematochezia _24 ; Heartburn_25 ; Abdominal pain _26 ; Constipation _27 ; Diarrhea _28 ; BRBPR _29    GU: Hematuria_30 ; Dysuria _31 ; Nocturia_32    Vascular: Pain in legs with walking _33 ; Pain in feet with lying flat _34 ; Non-healing sores _35 ; Stroke _36 ; TIA _37 ; Slurred speech _38 ;   Neuro: Headaches_39 ; Vertigo_40 ; Seizures_41 ; Paresthesias_42 ;Blurred vision _43 ; Diplopia _44 ; Vision changes _45    Ortho/Skin: Arthritis _46 ; Joint pain _47 ; Muscle pain _48 ; Joint swelling _49 ; Back Pain _50 ; Rash _51    Psych: Depression_52 ; Anxiety_53    Heme: Bleeding problems _54 ; Clotting disorders _55 ; Anemia _56    Endocrine: Diabetes _57 ; Thyroid dysfunction_58      Physical Exam/Data:   Vitals:   02/05/20 2300 02/05/20 2330 02/06/20 0000 02/06/20 0030  BP: 1_59 95/67  Pulse: 96 96 93 96  Resp: 13 (!) 32 (!) 22 15  Temp: 98 F (36.7 C)     TempSrc: Oral     SpO2: 97% 97% 95% 96%   No intake or output data in the 24 hours ending 02/06/20 0121 There were no vitals filed for this visit. There is no height or weight on file to  calculate BMI.  Well appearing NAD HEENT: Unremarkable Neck:  No JVD, no thyromegally Lymphatics:  No adenopathy Back:  No CVA tenderness Lungs:  Clear with no wheezes HEART:  Regular rate rhythm, no murmurs, no rubs, no clicks Abd:  soft, positive bowel sounds, no organomegally, no rebound, no guarding Ext:  2 plus pulses, no edema, no cyanosis, no clubbing Skin:  No rashes no nodules Neuro:  CN II through XII intact, motor grossly intact    Laboratory Data:  Chemistry Recent Labs  Lab 02/05/20 2307  NA 134*  K 4.9  CL 97*  CO2 22  GLUCOSE 446*  BUN 18  CREATININE 1.34*  CALCIUM 9.8  GFRNONAA 44*  GFRAA 52*  ANIONGAP 15    No results for input(s): PROT, ALBUMIN, AST, ALT, ALKPHOS, BILITOT in the last 168 hours. Hematology Recent Labs  Lab 02/05/20 2307  WBC 8.0  RBC 5.27*  HGB 14.7  HCT 47.4*  MCV 89.9  MCH 27.9  MCHC 31.0  RDW 21.7*  PLT 257   Cardiac EnzymesNo results for input(s): TROPONINI in the last 168 hours. No results for input(s): TROPIPOC in the last 168 hours.  BNPNo results for input(s): BNP, PROBNP in the last 168 hours.  DDimer No results for input(s): DDIMER in the last 168 hours.  Radiology/Studies:  No results found.  Assessment and Plan:   1. Recurrent ventricular tachycardia  The patient presents to the hospital after 2 ICD shocks at home. She reports that in  the past few weeks her Mexilitine and Digoxin were discontinued and she was placed back on Amiodarone. She also had a PET [facility] at [facility] to complete her workup for possible Sarcoidosis. She follows-up with Dr.McLean in the heart failure clinic  -Admit to the hospital -Daily weights, strict I and Os -Continuous telemetry -Continue Amiodarone (monitor QTc) -Continue Eliquis -Continue dapagliflozin -Continue carvedilol, Entresto -Continue prednisone and methotrexate -Hold spironolactone -Check lytes, Mg, TSH -Maintain serum K >4.0 and Mg >2.0 -EP consult in AM for further  recommendations   Severity of Illness: The appropriate patient status for this patient is INPATIENT. Inpatient status is judged to be reasonable and necessary in order to provide the required intensity of service to ensure the patient's safety. The patient's presenting symptoms, physical exam findings, and initial radiographic and laboratory data in the context of their chronic comorbidities is felt to place them at high risk for further clinical deterioration. Furthermore, it is not anticipated that the patient will be medically stable for discharge from the hospital within 2 midnights of admission. The following factors support the patient status of inpatient.   " The patient's presenting symptoms include palpitations. " The worrisome physical exam findings include NA " The initial radiographic and laboratory data are worrisome because of abnormal device interrogation. " The chronic co-morbidities include CHF.   * I certify that at the point of admission it is my clinical judgment that the patient will require inpatient hospital care spanning beyond 2 midnights from the point of admission due to high intensity of service, high risk for further deterioration and high frequency of surveillance required.*    For questions or updates, please contact [place] Please consult [email or web address] for contact info under Cardiology/STEMI.    Signed, Meade Maw, MD  02/06/2020 1:21 AM

## 2020-02-06 NOTE — Progress Notes (Signed)
ANTICOAGULATION CONSULT NOTE - Initial Consult  Pharmacy Consult for apixaban>heparin Indication: Hx RIJ clot  12/20/19   Allergies  Allergen Reactions  . Lisinopril Other (See Comments) and Cough    Flu like symptoms, also   . Ace Inhibitors Other (See Comments)    Flu-like symptoms  . Augmentin [Amoxicillin-Pot Clavulanate] Nausea And Vomiting  . Benazepril Other (See Comments)    Flu like symptoms  . Oxycodone-Acetaminophen Nausea And Vomiting  . Sulfa Antibiotics Nausea And Vomiting    Patient Measurements: Height: 5' (152.4 cm) Weight: 74.8 kg (164 lb 12.8 oz) IBW/kg (Calculated) : 45.5 Heparin Dosing Weight: 62kg  Vital Signs: Temp: 98 F (36.7 C) (09/28 1934) Temp Source: Oral (09/28 1934) BP: 105/71 (09/28 1934) Pulse Rate: 75 (09/28 1934)  Labs: Recent Labs    02/05/20 2307 02/06/20 2233  HGB 14.7  --   HCT 47.4*  --   PLT 257  --   APTT  --  52*  CREATININE 1.34*  --     Estimated Creatinine Clearance: 42.8 mL/min (A) (by C-G formula based on SCr of 1.34 mg/dL (H)).    Assessment: 10 yof with hx of Afib/RIJ clot in Aug - apixaban last received on 9/28@0405 . Plan for bronch soon - will transition to heparin per CCM prior.   First APTT 52 slightly subtherapeutic drawn 6hr after starting heparin, no bolus.  No issues with infusion or bleeding per RN.   Goal of Therapy:  Heparin level 0.3-0.7 units/ml aPTT 66-102 seconds Monitor platelets by anticoagulation protocol: Yes   Plan:  Increase heparin to 1000 units/hr  F/u aPTT until correlates with heparin level  Monitor daily aPTT, HL, CBC/plt Monitor for signs/symptoms of bleeding   Benetta Spar, PharmD, BCPS, BCCP Clinical Pharmacist  Please check AMION for all [place] phone numbers After 10:00 PM, call [place]

## 2020-02-07 ENCOUNTER — Inpatient Hospital Stay (HOSPITAL_COMMUNITY): Payer: Medicare Other

## 2020-02-07 ENCOUNTER — Encounter (HOSPITAL_COMMUNITY): Payer: Self-pay | Admitting: Cardiovascular Disease

## 2020-02-07 DIAGNOSIS — D8685 Sarcoid myocarditis: Secondary | ICD-10-CM

## 2020-02-07 DIAGNOSIS — I351 Nonrheumatic aortic (valve) insufficiency: Secondary | ICD-10-CM

## 2020-02-07 DIAGNOSIS — I340 Nonrheumatic mitral (valve) insufficiency: Secondary | ICD-10-CM

## 2020-02-07 LAB — HEPATIC FUNCTION PANEL
ALT: 20 U/L (ref 0–44)
AST: 14 U/L — ABNORMAL LOW (ref 15–41)
Albumin: 2.9 g/dL — ABNORMAL LOW (ref 3.5–5.0)
Alkaline Phosphatase: 36 U/L — ABNORMAL LOW (ref 38–126)
Bilirubin, Direct: 0.1 mg/dL (ref 0.0–0.2)
Indirect Bilirubin: 0.7 mg/dL (ref 0.3–0.9)
Total Bilirubin: 0.8 mg/dL (ref 0.3–1.2)
Total Protein: 5.6 g/dL — ABNORMAL LOW (ref 6.5–8.1)

## 2020-02-07 LAB — CBC
HCT: 45.1 % (ref 36.0–46.0)
Hemoglobin: 14 g/dL (ref 12.0–15.0)
MCH: 27.1 pg (ref 26.0–34.0)
MCHC: 31 g/dL (ref 30.0–36.0)
MCV: 87.4 fL (ref 80.0–100.0)
Platelets: 196 10*3/uL (ref 150–400)
RBC: 5.16 MIL/uL — ABNORMAL HIGH (ref 3.87–5.11)
RDW: 21.3 % — ABNORMAL HIGH (ref 11.5–15.5)
WBC: 11.3 10*3/uL — ABNORMAL HIGH (ref 4.0–10.5)
nRBC: 0 % (ref 0.0–0.2)

## 2020-02-07 LAB — BASIC METABOLIC PANEL
Anion gap: 10 (ref 5–15)
BUN: 20 mg/dL (ref 6–20)
CO2: 26 mmol/L (ref 22–32)
Calcium: 8.7 mg/dL — ABNORMAL LOW (ref 8.9–10.3)
Chloride: 101 mmol/L (ref 98–111)
Creatinine, Ser: 0.82 mg/dL (ref 0.44–1.00)
GFR calc Af Amer: >60 mL/min (ref >60)
GFR calc non Af Amer: >60 mL/min (ref >60)
Glucose, Bld: 259 mg/dL — ABNORMAL HIGH (ref 70–99)
Potassium: 3.8 mmol/L (ref 3.5–5.1)
Sodium: 137 mmol/L (ref 135–145)

## 2020-02-07 LAB — GLUCOSE, CAPILLARY
Glucose-Capillary: 214 mg/dL — ABNORMAL HIGH (ref 70–99)
Glucose-Capillary: 367 mg/dL — ABNORMAL HIGH (ref 70–99)
Glucose-Capillary: 266 mg/dL — ABNORMAL HIGH (ref 70–99)
Glucose-Capillary: 353 mg/dL — ABNORMAL HIGH (ref 70–99)

## 2020-02-07 LAB — ECHOCARDIOGRAM COMPLETE
AR max vel: 1.74 cm2
AV Area VTI: 1.69 cm2
AV Area mean vel: 1.62 cm2
AV Mean grad: 9 mmHg
AV Peak grad: 14.9 mmHg
Ao pk vel: 1.93 m/s
Area-P 1/2: 2.42 cm2
Calc EF: 39.1 %
Height: 60 in
P 1/2 time: 456 msec
S' Lateral: 3.6 cm
Single Plane A2C EF: 36.1 %
Single Plane A4C EF: 44.5 %
Weight: 2608 oz

## 2020-02-07 LAB — MAGNESIUM: Magnesium: 2.3 mg/dL (ref 1.7–2.4)

## 2020-02-07 LAB — APTT: aPTT: 101 seconds — ABNORMAL HIGH (ref 24–36)

## 2020-02-07 LAB — HEPARIN LEVEL (UNFRACTIONATED): Heparin Unfractionated: 1.12 IU/mL — ABNORMAL HIGH (ref 0.30–0.70)

## 2020-02-07 MED ORDER — SULFAMETHOXAZOLE-TRIMETHOPRIM 800-160 MG PO TABS
1.0000 | ORAL_TABLET | ORAL | Status: DC
  Administered 2020-02-09: 1 via ORAL
  Filled 2020-02-07: qty 1

## 2020-02-07 MED ORDER — SACUBITRIL-VALSARTAN 24-26 MG PO TABS
1.0000 | ORAL_TABLET | Freq: Two times a day (BID) | ORAL | Status: DC
  Administered 2020-02-07: 1 via ORAL
  Filled 2020-02-07 (×2): qty 1

## 2020-02-07 MED ORDER — CARVEDILOL 3.125 MG PO TABS
3.1250 mg | ORAL_TABLET | Freq: Two times a day (BID) | ORAL | Status: DC
  Filled 2020-02-07: qty 1

## 2020-02-07 MED ORDER — POTASSIUM CHLORIDE CRYS ER 20 MEQ PO TBCR
40.0000 meq | EXTENDED_RELEASE_TABLET | Freq: Once | ORAL | Status: AC
  Administered 2020-02-07: 40 meq via ORAL
  Filled 2020-02-07: qty 2

## 2020-02-07 MED ORDER — PERFLUTREN LIPID MICROSPHERE
1.0000 mL | INTRAVENOUS | Status: AC | PRN
  Administered 2020-02-07: 2 mL via INTRAVENOUS
  Filled 2020-02-07: qty 10

## 2020-02-07 MED ORDER — SODIUM CHLORIDE 0.9% FLUSH
3.0000 mL | Freq: Two times a day (BID) | INTRAVENOUS | Status: DC
  Administered 2020-02-09: 3 mL via INTRAVENOUS

## 2020-02-07 MED ORDER — ASPIRIN 81 MG PO CHEW: 81.0000 mg | CHEWABLE_TABLET | ORAL | Status: DC

## 2020-02-07 MED ORDER — SODIUM CHLORIDE 0.9 % IV SOLN: INTRAVENOUS | Status: DC

## 2020-02-07 MED ORDER — CARVEDILOL 3.125 MG PO TABS
3.1250 mg | ORAL_TABLET | Freq: Two times a day (BID) | ORAL | Status: DC
  Administered 2020-02-07 – 2020-02-09 (×4): 3.125 mg via ORAL
  Filled 2020-02-07 (×3): qty 1

## 2020-02-07 MED ORDER — SODIUM CHLORIDE 0.9% FLUSH: 3.0000 mL | INTRAVENOUS | Status: DC | PRN

## 2020-02-07 MED ORDER — SODIUM CHLORIDE 0.9 % IV SOLN: 250.0000 mL | INTRAVENOUS | Status: DC | PRN

## 2020-02-07 MED ORDER — METHOTREXATE 2.5 MG PO TABS
12.5000 mg | ORAL_TABLET | ORAL | Status: DC
  Administered 2020-02-07: 12.5 mg via ORAL
  Filled 2020-02-07: qty 5

## 2020-02-07 NOTE — Progress Notes (Addendum)
Advanced Heart Failure Rounding Note  PCP-Cardiologist: No primary care provider on file.   Subjective:    Feeling ok today. Denies SOB.    Objective:   Weight Range: 73.9 kg Body mass index is 31.83 kg/m.   Vital Signs:   Temp:  [97.5 F (36.4 C)-98.4 F (36.9 C)] 97.8 F (36.6 C) (09/29 0758) Pulse Rate:  [70-86] 78 (09/29 0758) Resp:  [16-18] 16 (09/29 0758) BP: (88-105)/(60-74) 88/62 (09/29 0815) SpO2:  [96 %-98 %] 96 % (09/29 0758) Weight:  [73.9 kg] 73.9 kg (09/29 0452) Last BM Date: 02/05/20  Weight change: Filed Weights   02/06/20 0322 02/07/20 0452  Weight: 74.8 kg 73.9 kg    Intake/Output:   Intake/Output Summary (Last 24 hours) at 02/07/2020 0852 Last data filed at 02/07/2020 0758 Gross per 24 hour  Intake 988.71 ml  Output 2550 ml  Net -1561.29 ml      Physical Exam    General:  Well appearing. No resp difficulty HEENT: Normal Neck: Supple. JVP flat . Carotids 2+ bilat; no bruits. No lymphadenopathy or thyromegaly appreciated. Cor: PMI nondisplaced. Regular rate & rhythm. No rubs, gallops or murmurs. Lungs: Clear Abdomen: Soft, nontender, nondistended. No hepatosplenomegaly. No bruits or masses. Good bowel sounds. Extremities: No cyanosis, clubbing, rash, edema Neuro: Alert & orientedx3, cranial nerves grossly intact. moves all 4 extremities w/o difficulty. Affect pleasant   Telemetry   SR no VT overnight. QT-c ok   EKG  QT ok on EKG today.   Labs    CBC Recent Labs    02/05/20 2307 02/07/20 0704  WBC 8.0 11.3*  NEUTROABS 7.0  --   HGB 14.7 14.0  HCT 47.4* 45.1  MCV 89.9 87.4  PLT 257 976   Basic Metabolic Panel Recent Labs    02/05/20 2307 02/07/20 0704  NA 134* 137  K 4.9 3.8  CL 97* 101  CO2 22 26  GLUCOSE 446* 259*  BUN 18 20  CREATININE 1.34* 0.82  CALCIUM 9.8 8.7*  MG 1.9  --    Liver Function Tests Recent Labs    02/07/20 0704  AST 14*  ALT 20  ALKPHOS 36*  BILITOT 0.8  PROT 5.6*  ALBUMIN 2.9*     No results for input(s): LIPASE, AMYLASE in the last 72 hours. Cardiac Enzymes No results for input(s): CKTOTAL, CKMB, CKMBINDEX, TROPONINI in the last 72 hours.  BNP: BNP (last 3 results) Recent Labs    11/26/19 2011 02/06/20 0725  BNP 1,258.1* 344.3*    ProBNP (last 3 results) No results for input(s): PROBNP in the last 8760 hours.   D-Dimer No results for input(s): DDIMER in the last 72 hours. Hemoglobin A1C Recent Labs    02/06/20 1008  HGBA1C 10.2*   Fasting Lipid Panel No results for input(s): CHOL, HDL, LDLCALC, TRIG, CHOLHDL, LDLDIRECT in the last 72 hours. Thyroid Function Tests Recent Labs    02/06/20 0725  TSH 2.235    Other results:   Imaging     No results found.   Medications:     Scheduled Medications:  carvedilol  3.125 mg Oral BID WC   Chlorhexidine Gluconate Cloth  6 each Topical Q0600   dapagliflozin propanediol  10 mg Oral Daily   digoxin  0.125 mg Oral Daily   folic acid  1 mg Oral Daily   gabapentin  600 mg Oral BID   influenza vac split quadrivalent PF  0.5 mL Intramuscular Tomorrow-1000   insulin aspart  0-15  Units Subcutaneous TID WC   insulin aspart  0-5 Units Subcutaneous QHS   methotrexate  12.5 mg Oral Weekly   mupirocin ointment  1 application Nasal BID   pantoprazole  80 mg Oral Daily   pneumococcal 23 valent vaccine  0.5 mL Intramuscular Tomorrow-1000   predniSONE  60 mg Oral Q breakfast   sacubitril-valsartan  1 tablet Oral BID   spironolactone  25 mg Oral Daily     Infusions:  amiodarone 30 mg/hr (02/07/20 0500)   heparin 1,000 Units/hr (02/07/20 0500)     PRN Medications:  albuterol, HYDROcodone-acetaminophen     Assessment/Plan  1. Recurrent VT Appropriate ICD shock for VT x2.  No VT over night.  On amio drip with plans to continue.   2. Chronic Systolic Heart Failure Echo 7/21with EF newly decreased to 20-25% with septal-lateral dyssynchrony and septal severe  hypokinesis, normal RV, moderate TR, moderate MR. Echo in 5/20 prior to PPM placement showed EF 60-65%. Possible causes of cardiomyopathy included cardiac sarcoidosis,chronic RV pacing, frequent NSVT/PVCs, ?CAD (has RFs), ?viral myocarditis (relatively recent COVID-19 infection). No ETOH/drugs.  LHC/RHC 7/23 with no significant CAD, elevated filling pressures, and low cardiac output on norepinephrine. IABP placed. IABP removed 7/26. cMRI 7/27 showed patchy LGE in the basal septum, basal-mid inferoseptum/inferior wall, basal anterior wall, mid anterolateral wall. Cardiac MRI LGE pattern could be consistent with cardiac sarcoidosis (would explain earlier CHB).  -Cardiac Pet completed and suggestive of cardiac sarcoid. - She is now s/p device upgrade to MDT CRT-D. - Repeat ECHO today.  - SBP low. Cut back coreg to 3.125 mg twice a day and cut back entresto to 24-26 mg twice a day.   - Continue digoxin due to nausea per EP. - Adding folic acid 1 mg daily  - Set up RHC with extreme fatigue. Difficult because if she is low output inotropes would be challenging with VT.  -May need to restart digoxin but will need to review liteature given active sarcoid.   3.  Cardiac Sarcoid - Cardiac Pet 9//17/21 suggestive of cardiac sarcoid, active inflammation , and extra cardiac extracardiac sarcoidosis, granulomatous disease in the chest and abdomen consistent withtreated granulomas.  -Sarcoid Treatment to date - 12/20/19 MTX 10 mg weekly - 12/20/19 Received 1 gram solumedrol IV x 5 days  - 8/16 Prednisone 60 mg daily - 01/30/20 Prednisone cut back to 40 mg daily  - Increase MTX to 12.5 mg daily. Increase prednisone to 60 mg daily.  -Long term will need to follow CBC, CMET monthly while titrating  MTX.  Will also need PJP prophylaxis. Discussing with pharmacy while considering QT prolongation.   - Continue folic acid 1 mg daily  - Will discuss with pulmonary + pharmacy - Will need another Cardiac PET  scan in 6 months.  - Discuss with pulmonary.   4. CHB:  s/p PPM 2/21>> had device upgrade toMDT CRT-D7/21  5. H//O PAF Maintaining SR.  - Hold eliquis with broch. Now on heparin drip    6. H/O IJ DVT Hold eliquis. On Heparin drip   7. Uncontrolled DMII Glucose > 400 on admit  - On ssi - Hgb A1C 10 - Diabetes Coordinator appreciated. Will need insulin at discharge.  Pulmonary consulted. Planning for bronch tomorrow with biopsy 9/30. Will set up [place].   Discussed with pharmacy. PJP prophylaxis recommendations Bactrim M-W-F.   Length of Stay: [place], NP  02/07/2020, 8:52 AM  Advanced Heart Failure Team Pager [phone] (M-F; 7a -  4p)  Please contact [facility] for night-coverage after hours (4p -7a ) and weekends on [email or web address]  Patient seen with NP, agree with the above note.   She has remained in NSR on amiodarone gtt with occasional PVCs.  ECG today with NSR, BiV pacing with paced QTc 522 msec. SBP 90s-100s, no lightheadedness.   Seen by Dr [place] Bells yesterday, plan for bronchoscopy with biopsy tomorrow to see if we can confirm sarcoidosis.  Apixaban on hold, heparin gtt started.   General: NAD Neck: No JVD, no thyromegaly or thyroid nodule.  Lungs: Clear to auscultation bilaterally with normal respiratory effort. CV: Nondisplaced PMI.  Heart regular S1/S2, no S3/S4, no murmur.  No peripheral edema.   Abdomen: Soft, nontender, no hepatosplenomegaly, no distention.  Skin: Intact without lesions or rashes.  Neurologic: Alert and oriented x 3.  Psych: Normal affect. Extremities: No clubbing or cyanosis.  HEENT: Normal.    1.Chronicsystolic CHF: Nonischemic cardiomyopathy. Echoin 7/21with EF newly decreased to 20-25% with septal-lateral dyssynchrony and septal severe hypokinesis, normal RV, moderate TR, moderate MR. Echo in 5/20 prior to PPM placement showed EF 60-65%. She developed shock in 7/21 requiring pressors/inotropes and IABP.LHC/RHC 12/01/19 with  no significant CAD, elevated filling pressures, and low cardiac output.  cMRI 12/05/19 showed patchy LGE in the basal septum, basal-mid inferoseptum/inferior wall, basal anterior wall, mid anterolateral wall. Cardiac MRI LGE pattern could be consistent with cardiac sarcoidosis (would explain earlier CHB). Alternatively, prior viral myocarditis (recent COVID-19). CT chest with ground glass lower lobes but no evidence for pulmonary sarcoidosis. She is now s/p device upgrade to MDT CRT-D.Cardiac PET in 8/21 was showed active inflammation in the heart, concerning again for cardiac sarcoidosis.  She is back in with VT.  She denies dyspnea but does note exertional fatigue.  She is not volume overloaded on exam.  - Continue digoxin 0.125, think mexiletine was the culprit for nausea.  - Continue spironolactone 25 mg daily - Decrease Entresto to 24/26 bid with soft BP.   - Decrease Coreg to 3.125 mg bid with soft BP.  -Continuedapagliflozin 10 mg daily.  - Would repeat RHC this admission after stabilized (Thurs vs Fri depending on transbronchial biopsy timing) to reassess cardiac output (given fatigue, concern for possible low output HF).  2. Cardiac sarcoidosis: Strongly suspected.  Cardiac MRI and cardiac PET both suggestive of cardiac sarcoidosis, and unexplained CHB also concerning, as is VT.  She had more VT after decreasing prednisone from 60 mg daily to 40 mg daily.  - She had initial pulse of 1 g Solumedrol x 1 (had 3 days of Solumedrol last admission) and then prednisone 60 mg daily. She will be on prednisone long-term, will need prophylaxis with Bactrim 3 days/week for PJP PNA.   - Continue methotrexate once a week, increased to 12.5 mg qwk. Will need to follow CBC, LFTs. Increase by 2.5 mg q2 wks to goal 20-25 mg.  - Cardiac PET reports granulomatous disease in chest and abdomen though no active inflammation => pulmonary following, plan transbronchial biopsy to try to solidify diagnosis.   3.Recurrent ventricular arrhythmias:She has hadVT with multiple morphologies. Increased ventricular arrhythmias over 6 months. No coronary disease on recent cath. Suspect as above that she has cardiac sarcoidosis (would also explain CHB). Amiodarone stopped in the past due to torsades and very long QTc on amiodarone.  Recurrent ventricular arrhythmias now twice after tapering prednisone.  - EP following, re-trying her on amidoarone.  Paced QTc is prolonged but acceptable.  - Continue  Coreg.  - Looks like she will need very slow prednisone taper with addition of MTX.  4. H/o CHB: Suspect due to cardiac sarcoidosis.  Now s/p CRT.  5. Rt IJ DVT: likely from prior central line.  - Will needanticoagulationfor 3 months => Eliquis (currently on hold for transbronchial bx and cath).  6. Type 2 diabetes: Glucose running high on steroids.  She has long-standing diabetes.   - Diabetes coordinator following and will adjust meds => will need insulin.  - She will benefit from endocrinology as outpatient, she will be on prednisone for at least a year.   Loralie Champagne 02/07/2020 12:39 PM

## 2020-02-07 NOTE — Anesthesia Preprocedure Evaluation (Addendum)
Anesthesia Evaluation  Patient identified by MRN, date of birth, ID band Patient awake    Reviewed: Allergy & Precautions, NPO status , Patient's Chart, lab work & pertinent test results, reviewed documented beta blocker date and time   Airway Mallampati: III  TM Distance: >3 FB Neck ROM: Full    Dental no notable dental hx. (+) Teeth Intact   Pulmonary shortness of breath and with exertion, asthma ,  Possible sarcoidosis Covid 19 3/21   Pulmonary exam normal breath sounds clear to auscultation       Cardiovascular hypertension, Pt. on medications and Pt. on home beta blockers + Past MI  + dysrhythmias + Cardiac Defibrillator  Rhythm:Regular Rate:Normal  Cardiogenic shock 7/21 requiring pressors and IABP support  Inflammatory changes myocardium thought to be due to cardiac sarcoid  AICD fired x 2 recently  Echo 02/07/20 1. Left ventricular ejection fraction, by estimation, is 35 to 40%. The left ventricle has moderately decreased function. The left ventricle demonstrates global hypokinesis. The left ventricular internal cavity size was mildly dilated. Left ventricular diastolic parameters are consistent with Grade I diastolic dysfunction (impaired relaxation).  2. Right ventricular systolic function is normal. The right ventricular size is normal. There is normal pulmonary artery systolic pressure.  3. The mitral valve is normal in structure. Mild mitral valve  regurgitation. No evidence of mitral stenosis.  4. The aortic valve is normal in structure. Aortic valve regurgitation is mild to moderate. Mild to moderate aortic valve sclerosis/calcification is present, without any evidence of aortic stenosis. Aortic regurgitation PHT measures 456 msec. Aortic valve mean gradient measures 9.0 mmHg. Aortic valve Vmax measures 1.93 m/s.  5. The inferior vena cava is normal in size with greater than 50% respiratory variability, suggesting  right atrial pressure of 3 mmHg.   EKG 02/07/20 Atrial sensed ventricular paced rhythm  Cardiac cath 12/01/19 1. Elevated right and left heart filling pressures.  2. Low cardiac output on norepinephrine 3.  3. Mixed pulmonary venous/pulmonary arterial hypertension.  4. No significant coronary disease.  5. Successful IABP placement   Possible cardiac sarcoid   Neuro/Psych negative neurological ROS  negative psych ROS   GI/Hepatic negative GI ROS, Neg liver ROS,   Endo/Other  diabetes, Poorly Controlled, Type 2, Oral Hypoglycemic Agents  Renal/GU Renal disease  negative genitourinary   Musculoskeletal  (+) Arthritis , Osteoarthritis,    Abdominal (+) + obese,   Peds  Hematology Anticoagulated -heparin unfractionated   Anesthesia Other Findings   Reproductive/Obstetrics                                                            Anesthesia Evaluation  Patient identified by MRN, date of birth, ID band Patient awake    Reviewed: Allergy & Precautions, NPO status , Patient's Chart, lab work & pertinent test results, reviewed documented beta blocker date and time   Airway Mallampati: III  TM Distance: >3 FB Neck ROM: Full    Dental no notable dental hx. (+) Teeth Intact   Pulmonary neg pulmonary ROS,    Pulmonary exam normal breath sounds clear to auscultation       Cardiovascular hypertension, Pt. on medications and Pt. on home beta blockers + Valvular Problems/Murmurs AI  Rhythm:Regular Rate:Bradycardia + Diastolic murmurs    Neuro/Psych negative neurological  ROS  negative psych ROS   GI/Hepatic   Endo/Other  diabetes, Well Controlled, Type 2, Oral Hypoglycemic AgentsMorbid obesity  Renal/GU   negative genitourinary   Musculoskeletal  (+) Arthritis , Osteoarthritis,  OA left knee   Abdominal   Peds  Hematology   Anesthesia Other Findings   Reproductive/Obstetrics                              Anesthesia Physical Anesthesia Plan  ASA: III  Anesthesia Plan: Spinal   Post-op Pain Management:  Regional for Post-op pain   Induction:   PONV Risk Score and Plan: 3 and Midazolam, Scopolamine patch - Pre-op, Ondansetron, Propofol infusion and Treatment may vary due to age or medical condition  Airway Management Planned: Natural Airway, Nasal Cannula and Simple Face Mask  Additional Equipment:   Intra-op Plan:   Post-operative Plan:   Informed Consent: I have reviewed the patients History and Physical, chart, labs and discussed the procedure including the risks, benefits and alternatives for the proposed anesthesia with the patient or authorized representative who has indicated his/her understanding and acceptance.   Dental advisory given  Plan Discussed with: CRNA and Surgeon  Anesthesia Plan Comments:         Anesthesia Quick Evaluation  Anesthesia Physical Anesthesia Plan  ASA: III  Anesthesia Plan: General   Post-op Pain Management:    Induction: Intravenous  PONV Risk Score and Plan: 4 or greater and Midazolam, Ondansetron, Treatment may vary due to age or medical condition and Dexamethasone  Airway Management Planned: Oral ETT  Additional Equipment:   Intra-op Plan:   Post-operative Plan: Extubation in OR  Informed Consent: I have reviewed the patients History and Physical, chart, labs and discussed the procedure including the risks, benefits and alternatives for the proposed anesthesia with the patient or authorized representative who has indicated his/her understanding and acceptance.     Dental advisory given  Plan Discussed with: CRNA and Anesthesiologist  Anesthesia Plan Comments:        Anesthesia Quick Evaluation

## 2020-02-07 NOTE — Progress Notes (Signed)
  Plan for bronchoscopy on 9/30 with biopsy per pulmonary.    Will set [place] Friday 02/09/20 at 1200 by Dr Aundra Dubin.   Leiby Pigeon NP-C  2:26 PM

## 2020-02-07 NOTE — Progress Notes (Addendum)
Progress Note  Patient Name: Lisa Poole Date of Encounter: 02/07/2020  [facility] Cardiologist: Dr. Aundra Dubin  Subjective   No CP,palpitations or SOB  Inpatient Medications    Scheduled Meds:  carvedilol  6.25 mg Oral BID WC   Chlorhexidine Gluconate Cloth  6 each Topical Q0600   dapagliflozin propanediol  10 mg Oral Daily   digoxin  0.125 mg Oral Daily   folic acid  1 mg Oral Daily   gabapentin  600 mg Oral BID   influenza vac split quadrivalent PF  0.5 mL Intramuscular Tomorrow-1000   insulin aspart  0-15 Units Subcutaneous TID WC   insulin aspart  0-5 Units Subcutaneous QHS   methotrexate  12.5 mg Oral Weekly   mupirocin ointment  1 application Nasal BID   pantoprazole  80 mg Oral Daily   pneumococcal 23 valent vaccine  0.5 mL Intramuscular Tomorrow-1000   predniSONE  60 mg Oral Q breakfast   sacubitril-valsartan  1 tablet Oral BID   spironolactone  25 mg Oral Daily   Continuous Infusions:  amiodarone 30 mg/hr (02/07/20 0500)   heparin 1,000 Units/hr (02/07/20 0500)   PRN Meds: albuterol, HYDROcodone-acetaminophen   Vital Signs    Vitals:   02/06/20 1934 02/07/20 0013 02/07/20 0318 02/07/20 0452  BP: 105/71 (!) 88/60 91/66 91/69   Pulse: 75 73 70 86  Resp: 18 17  18   Temp: 98 F (36.7 C) 97.9 F (36.6 C)  (!) 97.5 F (36.4 C)  TempSrc: Oral Oral  Oral  SpO2: 97% 96%  97%  Weight:    73.9 kg  Height:        Intake/Output Summary (Last 24 hours) at 02/07/2020 0757 Last data filed at 02/07/2020 0500 Gross per 24 hour  Intake 748.71 ml  Output 2550 ml  Net -1801.29 ml   Last 3 Weights 02/07/2020 02/06/2020 01/11/2020  Weight (lbs) 163 lb 164 lb 12.8 oz 170 lb  Weight (kg) 73.936 kg 74.753 kg 77.111 kg      Telemetry    AV pacing, occ PVCs, rare brief NSVT - Personally Reviewed  ECG    AV paced, QRS 170ms,  Manually measured QT 440-473ms, QTc 475- 458ms - Personally Reviewed  Physical Exam   GEN: No acute distress.   Neck: No JVD Cardiac:  RRR, no murmurs, rubs, or gallops.  Respiratory: CTA b/ly. GI: Soft, nontender, non-distended  MS: No edema; No deformity. Neuro:  Nonfocal  Psych: Normal affect   Labs    High Sensitivity Troponin:  No results for input(s): TROPONINIHS in the last 720 hours.    Chemistry Recent Labs  Lab 02/05/20 2307  NA 134*  K 4.9  CL 97*  CO2 22  GLUCOSE 446*  BUN 18  CREATININE 1.34*  CALCIUM 9.8  GFRNONAA 44*  GFRAA 52*  ANIONGAP 15     Hematology Recent Labs  Lab 02/05/20 2307  WBC 8.0  RBC 5.27*  HGB 14.7  HCT 47.4*  MCV 89.9  MCH 27.9  MCHC 31.0  RDW 21.7*  PLT 257    BNP Recent Labs  Lab 02/06/20 0725  BNP 344.3*     DDimer No results for input(s): DDIMER in the last 168 hours.   Radiology    No results found.  Cardiac Studies   01/26/20: PET FINAL COMMENTS   Cardiac PET with concurrent CT was performed.   Myocardial perfusion Rb-82 imaging demonstrates perfusion defects in 5 segments.   Metabolic imaging performed with FDG PET and  demonstrates mild to moderate abnormal uptake in the 5/17 segments   demonstrating perfusion abnormalities.   FDG PET/CT chest and upper abdomen: no evidence of metabolically active sarcoid in the lymph nodes, no metabolically active   pulmonary opacities. Calcified pulmonary nodules, hilar lymph nodes and calcifications in the spleen are consistent with   sequela of granulomatous disease.   Impression:   1. Findings in the myocardium are consistent with active inflammation.   2. No evidence of metabolically active extracardiac sarcoidosis, granulomatous disease in the chest and abdomen consistent   with treated granulomas.      12/01/2019: R/LHC 1. Elevated right and left heart filling pressures.  2. Low cardiac output on norepinephrine 3.  3. Mixed pulmonary venous/pulmonary arterial hypertension.  4. No significant coronary disease.  5. Successful IABP placement.      11/27/2019: TTE IMPRESSIONS  1. Left  ventricular ejection fraction, by estimation, is 20 to 25%. The  left ventricle has severely decreased function. The left ventricle  demonstrates regional wall motion abnormalities (see scoring  diagram/findings for description). The left  ventricular internal cavity size was mildly dilated. Left ventricular  diastolic parameters are indeterminate.   2. Right ventricular systolic function is normal. The right ventricular  size is mildly enlarged. There is moderately elevated pulmonary artery  systolic pressure. The estimated right ventricular systolic pressure is  50.3 mmHg.   3. Left atrial size was mildly dilated.   4. Right atrial size was moderately dilated.   5. The mitral valve is normal in structure. Moderate mitral valve  regurgitation.   6. Tricuspid valve regurgitation is moderate to severe.   7. The aortic valve is grossly normal. Aortic valve regurgitation is  mild.   8. The inferior vena cava is normal in size with <50% respiratory  variability, suggesting right atrial pressure of 8 mmHg.   Patient Profile     54 y.o. female with a hx of HTN, HLD, DM, CHB w/PPM > ICD, No obstructive CAD, subclinical breif AFib by device, developed NICM suspected sarcoid with cardiac sarcoid on methotrexate and prednisone outpatient,  R IJ DVT   Admitted with VT/ICD shocks   Recent events: 09/2018 - Echo EF 60-65%, mild LVH, RV normal. Mild- mod AI. No stenosis 02/2019 - developed complete heart block, s/p PPM 08/2019 - COVID 19 infection, did not require hospitalization 11/26/2019 - acute SOB, acute heart failure, tele showing episodes of VT, Amio for suppression 11/2019 - Echo EF newly decreased to 20-25% with septal-lateral dyssynchrony and septal severe hypokinesis, normal RV, moderate TR, moderate MR 11/28/2019 - cardiogenic shock requiring pressors 12/01/2019 - IABP placement, LHC/RHC with no significant CAD, elevated filling pressures, and low cardiac output on  norepinephrine 12/02/2019 - Torsades/VT x 3 with prolonged QT required DC-CV. Amio switched to lido.  12/04/2019 - IABP removed, hematoma right groin, Lidocaine stopped 12/05/2019 - CMR showed patchy LGE in the basal septum, basal-mid inferoseptum/inferior wall, basal anterior wall, mid anterolateral wall. Cardiac MRI LGE pattern could be consistent with cardiac sarcoidosis  12/19/2019 - admitted with prolonged QT and ventricular tachycardia. PPM changed to BiV ICD. 12/2019 - Right groin infection, MRSA on culture, treated with Linezolid, Rt IJ DVT (placed on Eliquis) 01/08/2020 - episode of shingles, given acyclovir and doxycycline Sep 2021 - Mexitil stopped due to nausea, Amio restarted   She saw Dr. Lovena Le 8/31 in the office, noted more VT, planned to stop mexiletine (w/nausea) and start amiodarone, device programmed VT one zone to deliver both burst and  ramp.  A f/u note by nursing on 9/14 reports also stopped dig.  Assessment & Plan    1. VT     Recurrent and not new for her   Lengthy review of her device interrogation noted true VT, though rate fairly slow 130's-150's (and asymptomatic) Unfortunately she was shocked for this slow VT, and her device reprogrammed yesterday 2 zone device VF >194bpm ATP before charge 25J, 35J x5 VT 128-194bpm ATP therapies only  Continue amio gtt today with plans to transition to PO tomorrow EKG is reviewed with Dr. Lovena Le, QT remains acceptable     2. NICM 3. Chronic CHF      Discussed with Dr. Aundra Dubin, they will see her      Continue home meds, PET scan reviewed, feels she has active cardiac sarcoid, will come on board and help manage her      Dig was stopped out pt via Dr. Lovena Le, Dr. Aundra Dubin will discuss with dr. Lovena Le   HF team is on board as well as pulmonary Planned tomorrow for bronch and [place]   BMET pending this AM   4. PAFib     H/o R IJ DVT     eliquis > heparin for procedures tomorrow    5. DM     SSI while here (mod scale given  steroids)  For questions or updates, please contact [place] Please consult [email or web address] for contact info under     Signed, Baldwin [place], PA-C  02/07/2020, 7:57 AM    EP Attending  Patient seen and examined. Agree with above. The patient is stable today with no more VT. Her device has reprogrammed to minimize shocks. She will continue on IV amiodarone today and then transition to oral amiodarone.  Carleene Overlie Melenie Minniear,MD

## 2020-02-07 NOTE — Progress Notes (Addendum)
[place] for apixaban>heparin Indication: Hx RIJ clot  12/20/19   Allergies  Allergen Reactions  . Lisinopril Other (See Comments) and Cough    Flu like symptoms, also   . Ace Inhibitors Other (See Comments)    Flu-like symptoms  . Augmentin [Amoxicillin-Pot Clavulanate] Nausea And Vomiting  . Benazepril Other (See Comments)    Flu like symptoms  . Oxycodone-Acetaminophen Nausea And Vomiting  . Sulfa Antibiotics Nausea And Vomiting    Patient Measurements: Height: 5' (152.4 cm) Weight: 73.9 kg (163 lb) IBW/kg (Calculated) : 45.5 Heparin Dosing Weight: 62kg  Vital Signs: Temp: 97.8 F (36.6 C) (09/29 0758) Temp Source: Oral (09/29 0758) BP: 88/62 (09/29 0815) Pulse Rate: 78 (09/29 0758)  Labs: Recent Labs    02/05/20 2307 02/06/20 2233 02/07/20 0704  HGB 14.7  --  14.0  HCT 47.4*  --  45.1  PLT 257  --  196  APTT  --  52* 101*  HEPARINUNFRC  --   --  1.12*  CREATININE 1.34*  --  0.82    Estimated Creatinine Clearance: 69.6 mL/min (by C-G formula based on SCr of 0.82 mg/dL).    Assessment: 19 yof with hx of Afib/RIJ clot in Aug - apixaban last received on 9/28 @0405 . Eliquis changed to heparin on 9/28 for planned bronch tomorrow (9/30)  APTT on increased significantly and on high end of therapeutic after increasing drip rate to 1000 units/hr. Heparin level remains falsely elevated given recent Eliquis. Will continue to dose based on aPTT until levels correlate. Confirmed that level was drawn correctly. No overt bleeding or infusion issues per discussion with nursing and patient.    Goal of Therapy:  Heparin level 0.3-0.7 units/ml aPTT 66-102 seconds Monitor platelets by anticoagulation protocol: Yes   Plan:  Decrease heparin to 950 units/hr  Turner heparin off at midnight tonight for planned bronch tomorrow morning F/u restart heparin vs Eliquis after Bronch tomorrow Monitor daily aPTT, HL, CBC/plt while on  heparin Monitor for signs/symptoms of bleeding   Richardine Service, PharmD PGY2 Cardiology Pharmacy Resident Phone: [phone] 02/07/2020  9:45 AM  Please check [email or web address] for unit-specific pharmacy phone numbers.

## 2020-02-07 NOTE — Progress Notes (Signed)
NAME:  Lisa Poole, MRN:  [id number], DOB:  1966-02-17, LOS: 1 ADMISSION DATE:  02/05/2020, CONSULTATION DATE: September 28 REFERRING MD: Dr. Marigene Ehlers, CHIEF COMPLAINT: Ventricular tachycardia, ICD fire  Brief History   54 year old female admitted on September 27 after her ICD fired twice.  She has a recent diagnosis of systolic heart failure with concern for possible cardiac sarcoidosis.  Pulmonary and critical care medicine was consulted for evaluation of possible sarcoidosis.  History of present illness   This is a pleasant 54 year old female who had no prior cardiac history until 2020 when she developed complete heart block.  She required pacemaker placement in the fall 2020.  In March 2021 she contracted COVID and said that she had a fever to 103, myalgias, and fatigue for approximately 1 week but then fully recovered.  In July 2021 she was admitted to our facility for cardiogenic shock.  For the first time she was noted to have an LVEF of 20 to 25% (it had previously been normal around the time of her diagnosis of complete heart block).  She required hospitalization, balloon pump treatment in addition to norepinephrine.  She had torsades during that admission. .  She recovered and during that hospitalization she had a cardiac MRI which was performed showing an infiltrative pattern in her myocardium worrisome for cardiac sarcoidosis.  An ICD was placed, she was discharged home.  Postoperative course was complicated by a right groin infection from MRSA requiring involvement from infectious diseases and treatment with linezolid.  Eventually she had a cardiac PET scan performed at [facility] which showed inflammatory changes in her myocardium but no evidence of extracardiac sarcoidosis in the calcified lymphadenopathy noted throughout her body. She has been admitted several times for her ICD firing.  This happened twice on September 26 that she was admitted here.  Pulmonary and critical care medicine was  consulted to evaluate her case and consider a tissue biopsy for possible sarcoid.  Of note she was started on Solu-Medrol and methotrexate after her most recent hospitalization in August 2021 for possible cardiac sarcoidosis.  She has been taking that medication since then.  She tells me that she grew up in [place], then moved to [place] where she lived for 25 years.  She relocated to [place] and after living there for several years she moved to [place].  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  She has never worked in a factory.  She had a parakeet when she was a kid but she has not had any pets to speak of for the last several years.  Her hobbies include crocheting but she does not perform any woodworking.  She does not believe that there is any mold in her house.  She denies having a humidifier or a hot tub.  She never smoked cigarettes.  She says that she has not had problems with cough over the last several years.  She did have shortness of breath prior to her admission in July but she said that after aggressive volume removal and titration of medications her shortness of breath improved.  This has not been a consistent problem for her since that hospitalization.  She denies any unexplained arthritis symptoms or of uveitis symptoms.  Past Medical History  Asthma Complete heart block Systolic heart failure DM2 Hyperlipidemia Hypertension  Significant Hospital Events   9/26 admission  Consults:  PCCM  Procedures:    Significant Diagnostic Tests:  July 2021 CT  chest images independently reviewed showing no significant mediastinal or hilar adenopathy.  There is some calcification in the left hilum which may represent old lymphadenopathy, though she does have adjacent aortic calcification.  Of note on abdominal images there is calcification throughout the spleen.  There is no significant pulmonary parenchymal abnormality to my view  though radiology mentions some bronchovascular distribution of groundglass opacification in the bases.  There is no significant fibrotic change.  Micro Data:    Antimicrobials:     Interim history/subjective:  On room air with no respiratory distress No complaints today  Objective   Blood pressure 94/73, pulse 76, temperature 98.4 F (36.9 C), temperature source Oral, resp. rate 18, height 5' (1.524 m), weight 73.9 kg, last menstrual period 07/25/2015, SpO2 96 %.        Intake/Output Summary (Last 24 hours) at 02/07/2020 1209 Last data filed at 02/07/2020 1035 Gross per 24 hour  Intake 988.71 ml  Output 3450 ml  Net -2461.29 ml   Filed Weights   02/06/20 0322 02/07/20 0452  Weight: 74.8 kg 73.9 kg    Examination: Gen:      No acute distress HEENT:  EOMI, sclera anicteric Neck:     No masses; no thyromegaly Lungs:    Clear to auscultation bilaterally; normal respiratory effort CV:         Regular rate and rhythm; no murmurs Abd:      + bowel sounds; soft, non-tender; no palpable masses, no distension Ext:    No edema; adequate peripheral perfusion Skin:      Warm and dry; no rash Neuro: alert and oriented x 3  Labs reviewed.   Significant for slightly elevated WBC count at 11.3 No new imaging   Resolved Hospital Problem list     Assessment & Plan:  54 year old with systolic heart failure, suspicion for cardiac sarcoid PCCM consulted for tissue diagnosis  Scheduled for bronchoscopy with transbronchial biopsies by Dr. Lake Bells tomorrow at 8 Risk/benefit discussed with patient and she has consented Hold heparin after midnight for procedure N.p.o. after midnight High-res CT today to reevaluate the lungs and guide procedure tomorrow  VT: Per cardiology   Best practice:   Per primary   Critical care time: n/a   Marshell Garfinkel MD [facility] Please see [email or web address] for pager details.  02/07/2020, 12:16 PM

## 2020-02-07 NOTE — Progress Notes (Addendum)
Inpatient Diabetes Program Recommendations  AACE/ADA: New Consensus Statement on Inpatient Glycemic Control (2015)  Target Ranges:  Prepandial:   less than 140 mg/dL      Peak postprandial:   less than 180 mg/dL (1-2 hours)      Critically ill patients:  140 - 180 mg/dL   Lab Results  Component Value Date   GLUCAP 214 (H) 02/07/2020   HGBA1C 10.2 (H) 02/06/2020    Review of Glycemic Control Results for Lisa Poole, Lisa Poole (MRN [id number]) as of 02/07/2020 09:45  Ref. Range 02/06/2020 11:36 02/06/2020 18:11 02/06/2020 22:03 02/07/2020 07:10  Glucose-Capillary Latest Ref Range: 70 - 99 mg/dL 203 (H) 334 (H) 276 (H) 214 (H)   Diabetes history: Type 2 DM Outpatient Diabetes medications: Farxiga 10 mg QD, Metformin 500 mg BID Current orders for Inpatient glycemic control: Novolog 0-15 units TID, Novolog 0-5 units QHS, Farxiga 10 mg QD Prednisone 60 mg QAM Solumedrol 1000 mg x 1  Inpatient Diabetes Program Recommendations:   Consider adding Levemir 7 units QD and Novolog 4 units TID (assuming patient is consuming >50% of meals). Will plan to see patient today.  Secure chat sent to Amy, NP.  Addendum: Spoke with patient regarding current inpatient insulin needs and glucose trends.  Reviewed need for insulin at discharge, frequency of recommended blood sugar checks, when to call MD and how steroids impact blood sugars. Patient has been self administering while inpatient and feels comfortable.  Discussed differences between long acting and short acting insulin, signs and symptoms of hyper vs hypo glycemia and interventions. Patient has no further questions. Educated patient on insulin pen use at home. Reviewed contents of insulin flexpen starter kit. Reviewed all steps if insulin pen including attachment of needle, 2-unit air shot, dialing up dose, giving injection, removing needle, disposal of sharps, storage of unused insulin, disposal of insulin etc. Patient able to provide successful return  demonstration. Also reviewed troubleshooting with insulin pen. MD to give patient Rxs for insulin pens and insulin pen needles.  Thanks, Bronson Curb, MSN, RNC-OB Diabetes Coordinator [phone] (8a-5p)

## 2020-02-08 ENCOUNTER — Encounter (HOSPITAL_COMMUNITY)
Admission: EM | Disposition: A | Discharge status: Home / Self Care | Payer: Self-pay | Source: Home / Self Care | Attending: Cardiology

## 2020-02-08 ENCOUNTER — Inpatient Hospital Stay (HOSPITAL_COMMUNITY): Payer: Medicare Other

## 2020-02-08 ENCOUNTER — Inpatient Hospital Stay (HOSPITAL_COMMUNITY): Payer: Medicare Other | Admitting: Anesthesiology

## 2020-02-08 ENCOUNTER — Ambulatory Visit (HOSPITAL_COMMUNITY): Admission: RE | Admit: 2020-02-08 | Payer: Medicare Other | Source: Home / Self Care | Admitting: Cardiology

## 2020-02-08 ENCOUNTER — Encounter (HOSPITAL_COMMUNITY): Payer: Self-pay | Admitting: Cardiovascular Disease

## 2020-02-08 DIAGNOSIS — D8685 Sarcoid myocarditis: Secondary | ICD-10-CM

## 2020-02-08 DIAGNOSIS — I472 Ventricular tachycardia: Secondary | ICD-10-CM

## 2020-02-08 HISTORY — PX: BIOPSY: SHX5522

## 2020-02-08 HISTORY — PX: VIDEO BRONCHOSCOPY: SHX5072

## 2020-02-08 HISTORY — PX: BRONCHIAL WASHINGS: SHX5105

## 2020-02-08 LAB — BASIC METABOLIC PANEL
Anion gap: 12 (ref 5–15)
BUN: 25 mg/dL — ABNORMAL HIGH (ref 6–20)
CO2: 23 mmol/L (ref 22–32)
Calcium: 9.3 mg/dL (ref 8.9–10.3)
Chloride: 103 mmol/L (ref 98–111)
Creatinine, Ser: 0.84 mg/dL (ref 0.44–1.00)
GFR calc Af Amer: >60 mL/min (ref >60)
GFR calc non Af Amer: >60 mL/min (ref >60)
Glucose, Bld: 217 mg/dL — ABNORMAL HIGH (ref 70–99)
Potassium: 5 mmol/L (ref 3.5–5.1)
Sodium: 138 mmol/L (ref 135–145)

## 2020-02-08 LAB — GLUCOSE, CAPILLARY
Glucose-Capillary: 183 mg/dL — ABNORMAL HIGH (ref 70–99)
Glucose-Capillary: 128 mg/dL — ABNORMAL HIGH (ref 70–99)
Glucose-Capillary: 241 mg/dL — ABNORMAL HIGH (ref 70–99)
Glucose-Capillary: 429 mg/dL — ABNORMAL HIGH (ref 70–99)

## 2020-02-08 LAB — HEPARIN LEVEL (UNFRACTIONATED): Heparin Unfractionated: 0.74 IU/mL — ABNORMAL HIGH (ref 0.30–0.70)

## 2020-02-08 LAB — CBC
HCT: 44.2 % (ref 36.0–46.0)
Hemoglobin: 13.8 g/dL (ref 12.0–15.0)
MCH: 27.9 pg (ref 26.0–34.0)
MCHC: 31.2 g/dL (ref 30.0–36.0)
MCV: 89.3 fL (ref 80.0–100.0)
Platelets: 231 10*3/uL (ref 150–400)
RBC: 4.95 MIL/uL (ref 3.87–5.11)
RDW: 21.7 % — ABNORMAL HIGH (ref 11.5–15.5)
WBC: 12.8 10*3/uL — ABNORMAL HIGH (ref 4.0–10.5)
nRBC: 0 % (ref 0.0–0.2)

## 2020-02-08 LAB — GLUCOSE 6 PHOSPHATE DEHYDROGENASE
G6PDH: 13.4 U/g{Hb} (ref 5.5–14.2)
Hemoglobin: 15 g/dL (ref 11.1–15.9)

## 2020-02-08 LAB — APTT: aPTT: 62 seconds — ABNORMAL HIGH (ref 24–36)

## 2020-02-08 SURGERY — BRONCHOSCOPY, WITH FLUOROSCOPY
Anesthesia: Moderate Sedation

## 2020-02-08 SURGERY — RIGHT HEART CATH
Anesthesia: LOCAL

## 2020-02-08 SURGERY — BRONCHOSCOPY, WITH FLUOROSCOPY
Anesthesia: General

## 2020-02-08 MED ORDER — BUTAMBEN-TETRACAINE-BENZOCAINE 2-2-14 % EX AERO
1.0000 | INHALATION_SPRAY | Freq: Once | CUTANEOUS | Status: DC
  Filled 2020-02-08: qty 20

## 2020-02-08 MED ORDER — ONDANSETRON HCL 4 MG/2ML IJ SOLN: 4.0000 mg | Freq: Once | INTRAMUSCULAR | Status: DC | PRN

## 2020-02-08 MED ORDER — PHENYLEPHRINE 40 MCG/ML (10ML) SYRINGE FOR IV PUSH (FOR BLOOD PRESSURE SUPPORT)
PREFILLED_SYRINGE | INTRAVENOUS | Status: DC | PRN
  Administered 2020-02-08 (×2): 80 ug via INTRAVENOUS

## 2020-02-08 MED ORDER — HEPARIN (PORCINE) 25000 UT/250ML-% IV SOLN
900.0000 [IU]/h | INTRAVENOUS | Status: DC
  Administered 2020-02-08: 950 [IU]/h via INTRAVENOUS

## 2020-02-08 MED ORDER — ONDANSETRON HCL 4 MG/2ML IJ SOLN
INTRAMUSCULAR | Status: DC | PRN
  Administered 2020-02-08: 4 mg via INTRAVENOUS

## 2020-02-08 MED ORDER — INSULIN ASPART 100 UNIT/ML ~~LOC~~ SOLN: 4.0000 [IU] | Freq: Three times a day (TID) | SUBCUTANEOUS | Status: DC

## 2020-02-08 MED ORDER — AMIODARONE HCL 200 MG PO TABS
200.0000 mg | ORAL_TABLET | Freq: Two times a day (BID) | ORAL | Status: DC
  Administered 2020-02-08 – 2020-02-09 (×3): 200 mg via ORAL
  Filled 2020-02-08 (×3): qty 1

## 2020-02-08 MED ORDER — PHENYLEPHRINE HCL-NACL 10-0.9 MG/250ML-% IV SOLN
INTRAVENOUS | Status: DC | PRN
  Administered 2020-02-08: 50 ug/min via INTRAVENOUS

## 2020-02-08 MED ORDER — SACUBITRIL-VALSARTAN 24-26 MG PO TABS
1.0000 | ORAL_TABLET | Freq: Two times a day (BID) | ORAL | Status: DC
  Administered 2020-02-08 – 2020-02-09 (×3): 1 via ORAL
  Filled 2020-02-08 (×2): qty 1

## 2020-02-08 MED ORDER — MIDAZOLAM HCL 5 MG/5ML IJ SOLN
INTRAMUSCULAR | Status: DC | PRN
  Administered 2020-02-08: 2 mg via INTRAVENOUS

## 2020-02-08 MED ORDER — INSULIN DETEMIR 100 UNIT/ML ~~LOC~~ SOLN: 7.0000 [IU] | Freq: Every day | SUBCUTANEOUS | Status: DC

## 2020-02-08 MED ORDER — LIDOCAINE HCL URETHRAL/MUCOSAL 2 % EX GEL: 1.0000 "application " | Freq: Once | CUTANEOUS | Status: DC

## 2020-02-08 MED ORDER — FENTANYL CITRATE (PF) 100 MCG/2ML IJ SOLN: 25.0000 ug | INTRAMUSCULAR | Status: DC | PRN

## 2020-02-08 MED ORDER — INSULIN DETEMIR 100 UNIT/ML ~~LOC~~ SOLN
7.0000 [IU] | Freq: Every day | SUBCUTANEOUS | Status: DC
  Administered 2020-02-08: 7 [IU] via SUBCUTANEOUS
  Filled 2020-02-08 (×3): qty 0.07

## 2020-02-08 MED ORDER — LIDOCAINE 2% (20 MG/ML) 5 ML SYRINGE
INTRAMUSCULAR | Status: DC | PRN
  Administered 2020-02-08: 40 mg via INTRAVENOUS

## 2020-02-08 MED ORDER — ASPIRIN 81 MG PO CHEW
81.0000 mg | CHEWABLE_TABLET | Freq: Once | ORAL | Status: AC
  Filled 2020-02-08: qty 1

## 2020-02-08 MED ORDER — PHENYLEPHRINE HCL 0.25 % NA SOLN
1.0000 | Freq: Four times a day (QID) | NASAL | Status: DC | PRN
  Filled 2020-02-08: qty 15

## 2020-02-08 MED ORDER — LACTATED RINGERS IV SOLN: INTRAVENOUS | Status: DC

## 2020-02-08 MED ORDER — SODIUM CHLORIDE 0.9 % IV SOLN: INTRAVENOUS | Status: DC

## 2020-02-08 MED ORDER — SUGAMMADEX SODIUM 200 MG/2ML IV SOLN
INTRAVENOUS | Status: DC | PRN
  Administered 2020-02-08: 150 mg via INTRAVENOUS

## 2020-02-08 MED ORDER — ROCURONIUM BROMIDE 10 MG/ML (PF) SYRINGE
PREFILLED_SYRINGE | INTRAVENOUS | Status: DC | PRN
  Administered 2020-02-08: 50 mg via INTRAVENOUS

## 2020-02-08 MED ORDER — PROPOFOL 10 MG/ML IV BOLUS
INTRAVENOUS | Status: DC | PRN
  Administered 2020-02-08: 90 mg via INTRAVENOUS

## 2020-02-08 MED ORDER — LACTATED RINGERS IV SOLN: INTRAVENOUS | Status: DC | PRN

## 2020-02-08 MED ORDER — INSULIN ASPART 100 UNIT/ML ~~LOC~~ SOLN
4.0000 [IU] | Freq: Three times a day (TID) | SUBCUTANEOUS | Status: DC
  Administered 2020-02-08: 4 [IU] via SUBCUTANEOUS

## 2020-02-08 NOTE — Anesthesia Postprocedure Evaluation (Signed)
Anesthesia Post Note  Patient: LAKEVIA PERRIS  Procedure(s) Performed: VIDEO BRONCHOSCOPY WITH FLUORO (N/A ) BIOPSY BRONCHIAL WASHINGS     Patient location during evaluation: PACU Anesthesia Type: General Level of consciousness: awake and alert and oriented Pain management: pain level controlled Vital Signs Assessment: post-procedure vital signs reviewed and stable Respiratory status: spontaneous breathing, nonlabored ventilation and respiratory function stable Cardiovascular status: blood pressure returned to baseline and stable Postop Assessment: no apparent nausea or vomiting Anesthetic complications: no   No complications documented.  Last Vitals:  Vitals:   02/08/20 0909 02/08/20 0924  BP: 101/61 97/72  Pulse: 69 70  Resp: 13 12  Temp:  36.7 C  SpO2: 100% 94%    Last Pain:  Vitals:   02/08/20 0924  TempSrc:   PainSc: 0-No pain                 Nikki Rusnak A.

## 2020-02-08 NOTE — Progress Notes (Addendum)
Advanced Heart Failure Rounding Note  PCP-Cardiologist: No primary care provider on file.   Subjective:    02/08/20 S/P Bronch with Bx   Yesterday coreg cut back to 3.125 mg twice a day and entresto was cut back to 24-26 mg twice a day.   Denies SOB.      Objective:   Weight Range: 74.7 kg Body mass index is 32.15 kg/m.   Vital Signs:   Temp:  [96.9 F (36.1 C)-98.4 F (36.9 C)] 98 F (36.7 C) (09/30 0924) Pulse Rate:  [68-86] 69 (09/30 0942) Resp:  [12-19] 14 (09/30 0942) BP: (87-112)/(52-74) 93/69 (09/30 0942) SpO2:  [94 %-100 %] 96 % (09/30 0942) Weight:  [74.7 kg] 74.7 kg (09/30 0619) Last BM Date: 02/07/20  Weight change: Filed Weights   02/06/20 0322 02/07/20 0452 02/08/20 0619  Weight: 74.8 kg 73.9 kg 74.7 kg    Intake/Output:   Intake/Output Summary (Last 24 hours) at 02/08/2020 1006 Last data filed at 02/08/2020 0839 Gross per 24 hour  Intake 2114.13 ml  Output 2300 ml  Net -185.87 ml      Physical Exam    General:  Drowsy.  No resp difficulty HEENT: Normal Neck: Supple. JVP flat . Carotids 2+ bilat; no bruits. No lymphadenopathy or thyromegaly appreciated. Cor: PMI nondisplaced. Regular rate & rhythm. No rubs, gallops or murmurs. Lungs: Clear Abdomen: Soft, nontender, nondistended. No hepatosplenomegaly. No bruits or masses. Good bowel sounds. Extremities: No cyanosis, clubbing, rash, edema Neuro: Alert & orientedx3, cranial nerves grossly intact. moves all 4 extremities w/o difficulty. Affect flat    Telemetry   AV paced no VT QT-C 489 ms   EKG  QT ok on EKG today.   Labs    CBC Recent Labs    02/05/20 2307 02/05/20 2307 02/07/20 0704 02/08/20 0347  WBC 8.0   < > 11.3* 12.8*  NEUTROABS 7.0  --   --   --   HGB 14.7   < > 14.0 13.8  HCT 47.4*   < > 45.1 44.2  MCV 89.9   < > 87.4 89.3  PLT 257   < > 196 231   < > = values in this interval not displayed.   Basic Metabolic Panel Recent Labs    02/05/20 2307 02/05/20 2307  02/07/20 0704 02/07/20 1350 02/08/20 0347  NA 134*   < > 137  --  138  K 4.9   < > 3.8  --  5.0  CL 97*   < > 101  --  103  CO2 22   < > 26  --  23  GLUCOSE 446*   < > 259*  --  217*  BUN 18   < > 20  --  25*  CREATININE 1.34*   < > 0.82  --  0.84  CALCIUM 9.8   < > 8.7*  --  9.3  MG 1.9  --   --  2.3  --    < > = values in this interval not displayed.   Liver Function Tests Recent Labs    02/07/20 0704  AST 14*  ALT 20  ALKPHOS 36*  BILITOT 0.8  PROT 5.6*  ALBUMIN 2.9*   No results for input(s): LIPASE, AMYLASE in the last 72 hours. Cardiac Enzymes No results for input(s): CKTOTAL, CKMB, CKMBINDEX, TROPONINI in the last 72 hours.  BNP: BNP (last 3 results) Recent Labs    11/26/19 2011 02/06/20 0725  BNP 1,258.1* 344.3*  ProBNP (last 3 results) No results for input(s): PROBNP in the last 8760 hours.   D-Dimer No results for input(s): DDIMER in the last 72 hours. Hemoglobin A1C Recent Labs    02/06/20 1008  HGBA1C 10.2*   Fasting Lipid Panel No results for input(s): CHOL, HDL, LDLCALC, TRIG, CHOLHDL, LDLDIRECT in the last 72 hours. Thyroid Function Tests Recent Labs    02/06/20 0725  TSH 2.235    Other results:   Imaging    CT Chest High Resolution  Result Date: 02/07/2020 CLINICAL DATA:  Sarcoidosis. EXAM: CT CHEST WITHOUT CONTRAST TECHNIQUE: Multidetector CT imaging of the chest was performed following the standard protocol without intravenous contrast. High resolution imaging of the lungs, as well as inspiratory and expiratory imaging, was performed. COMPARISON:  12/20/2019 chest radiograph. 12/06/2019 high-resolution chest CT. FINDINGS: Cardiovascular: Normal heart size. No significant pericardial effusion/thickening. Three lead left subclavian ICD with lead tips in the right atrium and right ventricle. Left anterior descending coronary atherosclerosis. Atherosclerotic nonaneurysmal thoracic aorta. Normal caliber pulmonary arteries.  Mediastinum/Nodes: No discrete thyroid nodules. Unremarkable esophagus. No pathologically enlarged noncalcified axillary, mediastinal or hilar lymph nodes, noting limited sensitivity for the detection of hilar adenopathy on this noncontrast study. Coarsely calcified small AP window and left hilar lymph nodes, unchanged. Lungs/Pleura: No pneumothorax. No pleural effusion. Subpleural 9 mm solid pulmonary nodule in the superior segment left lower lobe along the major fissure (series 6/image 50), new. Otherwise no acute consolidative airspace disease, lung masses or additional significant pulmonary nodules. Previously described patchy ground-glass opacities in the lower lungs have resolved. No significant regions of subpleural reticulation, ground-glass attenuation, traction bronchiectasis, architectural distortion or frank honeycombing. No significant air trapping or evidence of tracheobronchomalacia on the expiration sequence. Stable calcified subcentimeter left upper lobe granulomas. Upper abdomen: Stable calcified splenic and hepatic granulomas. Colonic diverticulosis. Musculoskeletal: No aggressive appearing focal osseous lesions. Mild thoracic spondylosis. IMPRESSION: 1. New 9 mm solid pulmonary nodule in the superior segment left lower lobe along the major fissure, more likely infectious/inflammatory given rapid appearance since recent 12/06/2019 chest CT study. Suggest attention on follow-up chest CT in 3 months. 2. No evidence of interstitial lung disease. Previously described patchy ground-glass opacities in the lower lungs have resolved. 3. Evidence of prior granulomatous disease with calcified left mediastinal and left hilar adenopathy and calcified granulomas in the left lung, liver and spleen. These findings are more likely due to remote granulomatous infection, although could be due to reported sarcoidosis. 4. One vessel coronary atherosclerosis. 5. Aortic Atherosclerosis (ICD10-I70.0). Electronically  Signed   By: Ilona Sorrel M.D.   On: 02/07/2020 15:56   DG CHEST PORT 1 VIEW  Result Date: 02/08/2020 CLINICAL DATA:  Status post bronchoscopy EXAM: PORTABLE CHEST 1 VIEW COMPARISON:  Chest radiograph December 20, 2019; chest CT February 07, 2020 FINDINGS: No pneumothorax. A pacemaker device overlies the area of nodular opacity seen on recent CT examination in the posterior segment left upper lobe. Visualized lungs appear clear. Heart size and pulmonary vascularity are normal. Pacemaker leads are attached to the right atrium, right ventricle, and coronary sinus. Several nonenlarged calcified lymph nodes are noted consistent with prior granulomatous disease. No bone lesions. IMPRESSION: No pneumothorax. Pulmonary nodular lesions seen in the left upper lobe on CT is obscured by pacemaker device. Visualized lungs appear clear. Evidence of prior granulomatous disease. Pacemaker leads attached to right atrium, right ventricle, and coronary sinus. Electronically Signed   By: Lowella Grip III M.D.   On: 02/08/2020 09:34  ECHOCARDIOGRAM COMPLETE  Result Date: 02/07/2020    ECHOCARDIOGRAM REPORT   Patient Name:   Lisa Poole Date of Exam: 02/07/2020 Medical Rec #:  [id number]      Height:       60.0 in Accession #:    [id number]     Weight:       163.0 lb Date of Birth:  08/02/1965       BSA:          46.711 m Patient Age:    54 years       BP:           94/73 mmHg Patient Gender: F              HR:           82 bpm. Exam Location:  Inpatient Procedure: 2D Echo and Intracardiac Opacification Agent Indications:    Cardiac Sarcoidosis D86.85  History:        Patient has prior history of Echocardiogram examinations, most                 recent 11/27/2019. Arrythmias:complete heart block; Risk                 Factors:Hypertension, Dyslipidemia and Diabetes. History of                 COVID 08/16/19, Cardiac arrest.  Sonographer:    Darlina Sicilian RDCS Referring Phys: [phone] AMY D CLEGG IMPRESSIONS  1. Left ventricular  ejection fraction, by estimation, is 35 to 40%. The left ventricle has moderately decreased function. The left ventricle demonstrates global hypokinesis. The left ventricular internal cavity size was mildly dilated. Left ventricular diastolic parameters are consistent with Grade I diastolic dysfunction (impaired relaxation).  2. Right ventricular systolic function is normal. The right ventricular size is normal. There is normal pulmonary artery systolic pressure.  3. The mitral valve is normal in structure. Mild mitral valve regurgitation. No evidence of mitral stenosis.  4. The aortic valve is normal in structure. Aortic valve regurgitation is mild to moderate. Mild to moderate aortic valve sclerosis/calcification is present, without any evidence of aortic stenosis. Aortic regurgitation PHT measures 456 msec. Aortic valve mean gradient measures 9.0 mmHg. Aortic valve Vmax measures 1.93 m/s.  5. The inferior vena cava is normal in size with greater than 50% respiratory variability, suggesting right atrial pressure of 3 mmHg. Comparison(s): Prior images reviewed side by side. Changes from prior study are noted. The left ventricular function has improved. Prior EF 20-25%. FINDINGS  Left Ventricle: Left ventricular ejection fraction, by estimation, is 35 to 40%. The left ventricle has moderately decreased function. The left ventricle demonstrates global hypokinesis. Definity contrast agent was given IV to delineate the left ventricular endocardial borders. The left ventricular internal cavity size was mildly dilated. There is no left ventricular hypertrophy. Left ventricular diastolic parameters are consistent with Grade I diastolic dysfunction (impaired relaxation).  LV Wall Scoring: The inferior wall is akinetic. The entire anterior wall and posterior wall are hypokinetic. Right Ventricle: The right ventricular size is normal. No increase in right ventricular wall thickness. Right ventricular systolic function is  normal. There is normal pulmonary artery systolic pressure. The tricuspid regurgitant velocity is 2.29 m/s, and  with an assumed right atrial pressure of 3 mmHg, the estimated right ventricular systolic pressure is 96.2 mmHg. Left Atrium: Left atrial size was normal in size. Right Atrium: Right atrial size was normal in size. Pericardium: There is no evidence  of pericardial effusion. Mitral Valve: The mitral valve is normal in structure. Mild mitral valve regurgitation. No evidence of mitral valve stenosis. Tricuspid Valve: The tricuspid valve is normal in structure. Tricuspid valve regurgitation is trivial. No evidence of tricuspid stenosis. Aortic Valve: The aortic valve is normal in structure. Aortic valve regurgitation is mild to moderate. Aortic regurgitation PHT measures 456 msec. Mild to moderate aortic valve sclerosis/calcification is present, without any evidence of aortic stenosis. Aortic valve mean gradient measures 9.0 mmHg. Aortic valve peak gradient measures 14.9 mmHg. Aortic valve area, by VTI measures 1.69 cm. Pulmonic Valve: The pulmonic valve was normal in structure. Pulmonic valve regurgitation is not visualized. No evidence of pulmonic stenosis. Aorta: The aortic root is normal in size and structure. Venous: The inferior vena cava is normal in size with greater than 50% respiratory variability, suggesting right atrial pressure of 3 mmHg. IAS/Shunts: No atrial level shunt detected by color flow Doppler. Additional Comments: A pacer wire is visualized.  LEFT VENTRICLE PLAX 2D LVIDd:         5.40 cm      Diastology LVIDs:         3.60 cm      LV e' medial:    3.81 cm/s LV PW:         0.80 cm      LV E/e' medial:  13.8 LV IVS:        0.80 cm      LV e' lateral:   6.41 cm/s LVOT diam:     2.00 cm      LV E/e' lateral: 8.2 LV SV:         62 LV SV Index:   36 LVOT Area:     3.14 cm  LV Volumes (MOD) LV vol d, MOD A2C: 103.0 ml LV vol d, MOD A4C: 125.0 ml LV vol s, MOD A2C: 65.8 ml LV vol s, MOD A4C:  69.4 ml LV SV MOD A2C:     37.2 ml LV SV MOD A4C:     125.0 ml LV SV MOD BP:      45.7 ml RIGHT VENTRICLE RV S prime:     13.30 cm/s LEFT ATRIUM             Index       RIGHT ATRIUM          Index LA diam:        3.00 cm 1.75 cm/m  RA Area:     9.66 cm LA Vol (A2C):   20.7 ml 12.10 ml/m RA Volume:   17.60 ml 10.29 ml/m LA Vol (A4C):   26.0 ml 15.19 ml/m LA Biplane Vol: 24.5 ml 14.32 ml/m  AORTIC VALVE AV Area (Vmax):    1.74 cm AV Area (Vmean):   1.62 cm AV Area (VTI):     1.69 cm AV Vmax:           193.00 cm/s AV Vmean:          148.000 cm/s AV VTI:            0.369 m AV Peak Grad:      14.9 mmHg AV Mean Grad:      9.0 mmHg LVOT Vmax:         107.00 cm/s LVOT Vmean:        76.300 cm/s LVOT VTI:          0.198 m LVOT/AV VTI ratio: 0.54 AI PHT:  456 msec  AORTA Ao Root diam: 2.60 cm Ao Asc diam:  3.40 cm MITRAL VALVE               TRICUSPID VALVE MV Area (PHT): 2.42 cm    TR Peak grad:   21.0 mmHg MV Decel Time: 314 msec    TR Vmax:        229.00 cm/s MV E velocity: 52.70 cm/s MV A velocity: 70.70 cm/s  SHUNTS MV E/A ratio:  0.75        Systemic VTI:  0.20 m                            Systemic Diam: 2.00 cm Candee Furbish MD Electronically signed by Candee Furbish MD Signature Date/Time: 02/07/2020/4:31:35 PM    Final    DG C-ARM BRONCHOSCOPY  Result Date: 02/08/2020 C-ARM BRONCHOSCOPY: Fluoroscopy was utilized by the requesting physician.  No radiographic interpretation.     Medications:     Scheduled Medications: . amiodarone  200 mg Oral BID  . [START ON 02/09/2020] aspirin  81 mg Oral Once  . carvedilol  3.125 mg Oral BID WC  . Chlorhexidine Gluconate Cloth  6 each Topical Q0600  . dapagliflozin propanediol  10 mg Oral Daily  . digoxin  0.125 mg Oral Daily  . folic acid  1 mg Oral Daily  . gabapentin  600 mg Oral BID  . influenza vac split quadrivalent PF  0.5 mL Intramuscular Tomorrow-1000  . insulin aspart  0-15 Units Subcutaneous TID WC  . insulin aspart  0-5 Units  Subcutaneous QHS  . methotrexate  12.5 mg Oral Weekly  . mupirocin ointment  1 application Nasal BID  . pantoprazole  80 mg Oral Daily  . pneumococcal 23 valent vaccine  0.5 mL Intramuscular Tomorrow-1000  . predniSONE  60 mg Oral Q breakfast  . sacubitril-valsartan  1 tablet Oral BID  . sodium chloride flush  3 mL Intravenous Q12H  . spironolactone  25 mg Oral Daily  . [START ON 02/09/2020] sulfamethoxazole-trimethoprim  1 tablet Oral Once per day on Mon Wed Fri    Infusions:   PRN Medications: albuterol, HYDROcodone-acetaminophen     Assessment/Plan  1. Recurrent VT Appropriate ICD shock for VT x2.  No VT over night.  Completed amio IV load. Started on amio 200 mg twice a day. Intolerant Mexitil due to nausea.    2. Chronic Systolic Heart Failure Echo 7/21with EF newly decreased to 20-25% with septal-lateral dyssynchrony and septal severe hypokinesis, normal RV, moderate TR, moderate MR. Echo in 5/20 prior to PPM placement showed EF 60-65%. Possible causes of cardiomyopathy included cardiac sarcoidosis,chronic RV pacing, frequent NSVT/PVCs, ?CAD (has RFs), ?viral myocarditis (relatively recent COVID-19 infection). No ETOH/drugs.  LHC/RHC 7/23 with no significant CAD, elevated filling pressures, and low cardiac output on norepinephrine. IABP placed. IABP removed 7/26. cMRI 7/27 showed patchy LGE in the basal septum, basal-mid inferoseptum/inferior wall, basal anterior wall, mid anterolateral wall. Cardiac MRI LGE pattern could be consistent with cardiac sarcoidosis (would explain earlier CHB).  - Cardiac Pet completed and suggestive of cardiac sarcoid. - She is now s/p device upgrade to MDT CRT-D. - Repeat ECHO showed EF 35-40%. Grade 1 DD.  - SBP low. Continue lower dose of coreg at 3.125 mg twice a day  -Entresto held last night for hypotension. Stop entresto for now.  - Continue digoxin.  - Continue folic acid 1 mg daily  - Set  up [place] with extreme fatigue  (tomorrow). Difficult because if she is low output, inotropes would be challenging with VT.   3.  Cardiac Sarcoid - Cardiac Pet 9//17/21 suggestive of cardiac sarcoid, active inflammation , and extra cardiac extracardiac sarcoidosis, granulomatous disease in the chest and abdomen consistent withtreated granulomas.  -Sarcoid Treatment to date - 12/20/19 MTX 10 mg weekly - 12/20/19 Received 1 gram solumedrol IV x 5 days  - 8/16 Prednisone 60 mg daily - 01/30/20 Prednisone cut back to 40 mg daily  - 9/29 Increased MTX to 12.5 mg daily. Continue prednisone to 60 mg daily. Anticipate increasing MTX every 2 weeks until at dose between 20-25 mg.  -Long term will need to follow CBC, CMET monthly while titrating  MTX.  Will also need PJP prophylaxis. Discussing with pharmacy while considering QT prolongation.  Start Bactrim DS M-W-F.  - Continue folic acid 1 mg daily  - Will discuss with pulmonary + pharmacy - Will need another Cardiac PET scan in 6 months.  - S/P Bronc with Bx today though if negative doubt will change treatment course as this could be isolated sarcoid.   4. CHB:  s/p PPM 2/21>> had device upgrade toMDT CRT-D7/21  5. H//O PAF Maintaining SR.  - Hold eliquis with broch. Now on heparin drip    6. H/O IJ DVT Hold eliquis.  Off Heparin drip post lung bx.   7. Uncontrolled DMII Glucose > 400 on admit  - On ssi - Hgb A1C 10 - Diabetes Coordinator appreciated.  - Add Levemir 7 units QD and tonight can start Novolog 4 units TID.  - She will need insulin pens and insulin pen needles at d/c. Discussed with pharmacy. Marland Kitchen RHC tomorrow at 1200   Length of Stay: 2  Darrick Grinder, NP  02/08/2020, 10:06 AM  Advanced Heart Failure Team Pager [phone] (M-F; 7a - 4p)  Please contact [facility] for night-coverage after hours (4p -7a ) and weekends on [email or web address]  Patient seen with NP, agree with the above note.   Sleepy, just returned from bronchoscopy with transbronchial  biopsy.  No complaints.  SBP 90s-100s.   General: NAD Neck: No JVD, no thyromegaly or thyroid nodule.  Lungs: Clear to auscultation bilaterally with normal respiratory effort. CV: Nondisplaced PMI.  Heart regular S1/S2, no S3/S4, no murmur.  No peripheral edema.  N Abdomen: Soft, nontender, no hepatosplenomegaly, no distention.  Skin: Intact without lesions or rashes.  Neurologic: Alert and oriented x 3.  Psych: Normal affect. Extremities: No clubbing or cyanosis.  HEENT: Normal.   1.Chronicsystolic CHF: Nonischemic cardiomyopathy. Echoin 7/21with EF newly decreased to 20-25% with septal-lateral dyssynchrony and septal severe hypokinesis, normal RV, moderate TR, moderate MR. Echo in 5/20 prior to PPM placement showed EF 60-65%. She developed shock in 7/21 requiring pressors/inotropes and IABP.LHC/RHC 7/23/21with no significant CAD, elevated filling pressures, and low cardiac output. cMRI 7/27/21showed patchy LGE in the basal septum, basal-mid inferoseptum/inferior wall, basal anterior wall, mid anterolateral wall. Cardiac MRI LGE pattern could be consistent with cardiac sarcoidosis (would explain earlier CHB). Alternatively, prior viral myocarditis (recent COVID-19). CT chest with ground glass lower lobes but no evidence for pulmonary sarcoidosis. She is now s/p device upgrade to MDT CRT-D.Cardiac PET in 8/21 was showed active inflammation in the heart, concerning again for cardiac sarcoidosis. She is back in with VT. She denies dyspnea but does note exertional fatigue. She is not volume overloaded on exam.  -Continue digoxin 0.125, think mexiletine was the culprit for  nausea. - Continue spironolactone 25 mg daily - Decreased Entresto to 24/26 bid with soft BP (give as long as SBP > 85).  - Decreased Coreg to 3.125 mg bid with soft BP. -Continuedapagliflozin 10 mg daily. - Would repeat RHC tomorrow to reassess cardiac output (given fatigue, concern for possible low output  HF).  We discussed risks/benefits of procedure and she agrees.  2. Cardiac sarcoidosis: Strongly suspected. Cardiac MRI and cardiac PET both suggestive of cardiac sarcoidosis, and unexplained CHB also concerning, as is VT. She had more VT after decreasing prednisone from 60 mg daily to 40 mg daily.  - She had initial pulse of 1 g Solumedrol x 1 (had 3 days of Solumedrol last admission) and then prednisone 60 mg daily. She will be on prednisone long-term, will need prophylaxis with Bactrim 3 days/week for PJP PNA.   - Continue methotrexate once a week, increased to 12.5 mg qwk. Will need to follow CBC, LFTs. Increase by 2.5 mg q2 wks to goal 20-25 mg.  - Cardiac PET reports granulomatous disease in chest and abdomen though no active inflammation => pulmonary following, had transbronchial bx today to try to solidify diagnosis. However, even if negative would continue treatment as possible to have isolated cardiac sarcoidosis and clinical course as well as imaging very suggestive of cardiac sarcoidosis.  3.Recurrent ventricular arrhythmias:She has hadVT with multiple morphologies. Increased ventricular arrhythmias over 6 months. No coronary disease on recent cath. Suspect as abovethat she has cardiac sarcoidosis (would also explain CHB). Amiodarone stopped in the past due to torsades and very long QTc on amiodarone. Recurrent ventricular arrhythmias now twice after tapering prednisone. -EP following, re-trying her on amidoarone => transition to po today. Paced QTc is prolonged but acceptable.  - Continue Coreg.  -Looks like she will need very slow prednisone taper with addition of MTX.  4. H/o PZW:CHENIDP due to cardiac sarcoidosis. Now s/p CRT.  5. Rt IJ DVT: likely from prior central line.  - Will needanticoagulationfor 3 months=>Eliquis (currently on hold for transbronchial bx and cath), cover with heparin gtt.  6. Type 2 diabetes: Glucose running high on steroids. She has  long-standing diabetes.  - Diabetes coordinator following and will adjust meds => will need insulin.  - She will benefit from endocrinology as outpatient, she will be on prednisone for at least a year.  Loralie Champagne 02/08/2020 11:08 AM

## 2020-02-08 NOTE — Progress Notes (Addendum)
Progress Note  Patient Name: Lisa Poole Date of Encounter: 02/08/2020  [facility] Cardiologist: Dr. Aundra Dubin  Subjective   No CP, palpitations or SOB, seen in endo, pending bronch  Inpatient Medications    Scheduled Meds:  [MAR Hold] aspirin  81 mg Oral Once   [MAR Hold] carvedilol  3.125 mg Oral BID WC   [MAR Hold] Chlorhexidine Gluconate Cloth  6 each Topical Q0600   [MAR Hold] dapagliflozin propanediol  10 mg Oral Daily   [MAR Hold] digoxin  0.125 mg Oral Daily   [MAR Hold] folic acid  1 mg Oral Daily   [MAR Hold] gabapentin  600 mg Oral BID   influenza vac split quadrivalent PF  0.5 mL Intramuscular Tomorrow-1000   [MAR Hold] insulin aspart  0-15 Units Subcutaneous TID WC   [MAR Hold] insulin aspart  0-5 Units Subcutaneous QHS   [MAR Hold] methotrexate  12.5 mg Oral Weekly   [MAR Hold] mupirocin ointment  1 application Nasal BID   [MAR Hold] pantoprazole  80 mg Oral Daily   pneumococcal 23 valent vaccine  0.5 mL Intramuscular Tomorrow-1000   [MAR Hold] predniSONE  60 mg Oral Q breakfast   [MAR Hold] sacubitril-valsartan  1 tablet Oral BID   [MAR Hold] sodium chloride flush  3 mL Intravenous Q12H   [MAR Hold] spironolactone  25 mg Oral Daily   [MAR Hold] sulfamethoxazole-trimethoprim  1 tablet Oral Once per day on Mon Wed Fri   Continuous Infusions:  sodium chloride     [START ON 02/09/2020] sodium chloride     amiodarone 30 mg/hr (02/08/20 0300)   lactated ringers 10 mL/hr at 02/08/20 0721   PRN Meds: sodium chloride, [MAR Hold] albuterol, [MAR Hold] HYDROcodone-acetaminophen, sodium chloride flush   Vital Signs    Vitals:   02/07/20 2347 02/08/20 0110 02/08/20 0619 02/08/20 0709  BP: (!) 88/60 100/71 109/74 99/65  Pulse: 70 70 71 69  Resp: 14 16 16 17   Temp: 97.7 F (36.5 C)  97.8 F (36.6 C) 97.7 F (36.5 C)  TempSrc: Oral  Oral Temporal  SpO2: 97%  96% 98%  Weight:   74.7 kg   Height:        Intake/Output Summary (Last 24 hours) at 02/08/2020  0824 Last data filed at 02/08/2020 0300 Gross per 24 hour  Intake 1714.13 ml  Output 2300 ml  Net -585.87 ml   Last 3 Weights 02/08/2020 02/07/2020 02/06/2020  Weight (lbs) 164 lb 9.6 oz 163 lb 164 lb 12.8 oz  Weight (kg) 74.662 kg 73.936 kg 74.753 kg      Telemetry    AV pacing, occ PVCs, rare brief wide complex, slow, 100's - Personally Reviewed  ECG    AV paced, 75bpm, QTc looks OK - Personally Reviewed  Physical Exam   GEN: No acute distress.   Neck: No JVD Cardiac: RRR, no murmurs, rubs, or gallops.  Respiratory: CTA b/ly. GI: Soft, nontender, non-distended  MS: No edema; No deformity. Neuro:  Nonfocal  Psych: Normal affect   Labs    High Sensitivity Troponin:  No results for input(s): TROPONINIHS in the last 720 hours.    Chemistry Recent Labs  Lab 02/05/20 2307 02/07/20 0704 02/08/20 0347  NA 134* 137 138  K 4.9 3.8 5.0  CL 97* 101 103  CO2 22 26 23   GLUCOSE 446* 259* 217*  BUN 18 20 25*  CREATININE 1.34* 0.82 0.84  CALCIUM 9.8 8.7* 9.3  PROT  --  5.6*  --  ALBUMIN  --  2.9*  --   AST  --  14*  --   ALT  --  20  --   ALKPHOS  --  36*  --   BILITOT  --  0.8  --   GFRNONAA 44* >60 >60  GFRAA 52* >60 >60  ANIONGAP 15 10 12      Hematology Recent Labs  Lab 02/05/20 2307 02/07/20 0704 02/08/20 0347  WBC 8.0 11.3* 12.8*  RBC 5.27* 5.16* 4.95  HGB 14.7 14.0 13.8  HCT 47.4* 45.1 44.2  MCV 89.9 87.4 89.3  MCH 27.9 27.1 27.9  MCHC 31.0 31.0 31.2  RDW 21.7* 21.3* 21.7*  PLT 257 196 231    BNP Recent Labs  Lab 02/06/20 0725  BNP 344.3*     DDimer No results for input(s): DDIMER in the last 168 hours.   Radiology    CT Chest High Resolution Result Date: 02/07/2020 CLINICAL DATA:  Sarcoidosis. EXAM: CT CHEST WITHOUT CONTRAST TECHNIQUE: Multidetector CT imaging of the chest was performed following the standard protocol without intravenous contrast. High resolution imaging of the lungs, as well as inspiratory and expiratory imaging, was  performed. COMPARISON:  12/20/2019 chest radiograph. 12/06/2019 high-resolution chest CT. FINDINGS: Cardiovascular: Normal heart size. No significant pericardial effusion/thickening. Three lead left subclavian ICD with lead tips in the right atrium and right ventricle. Left anterior descending coronary atherosclerosis. Atherosclerotic nonaneurysmal thoracic aorta. Normal caliber pulmonary arteries. Mediastinum/Nodes: No discrete thyroid nodules. Unremarkable esophagus. No pathologically enlarged noncalcified axillary, mediastinal or hilar lymph nodes, noting limited sensitivity for the detection of hilar adenopathy on this noncontrast study. Coarsely calcified small AP window and left hilar lymph nodes, unchanged. Lungs/Pleura: No pneumothorax. No pleural effusion. Subpleural 9 mm solid pulmonary nodule in the superior segment left lower lobe along the major fissure (series 6/image 50), new. Otherwise no acute consolidative airspace disease, lung masses or additional significant pulmonary nodules. Previously described patchy ground-glass opacities in the lower lungs have resolved. No significant regions of subpleural reticulation, ground-glass attenuation, traction bronchiectasis, architectural distortion or frank honeycombing. No significant air trapping or evidence of tracheobronchomalacia on the expiration sequence. Stable calcified subcentimeter left upper lobe granulomas. Upper abdomen: Stable calcified splenic and hepatic granulomas. Colonic diverticulosis. Musculoskeletal: No aggressive appearing focal osseous lesions. Mild thoracic spondylosis. IMPRESSION: 1. New 9 mm solid pulmonary nodule in the superior segment left lower lobe along the major fissure, more likely infectious/inflammatory given rapid appearance since recent 12/06/2019 chest CT study. Suggest attention on follow-up chest CT in 3 months. 2. No evidence of interstitial lung disease. Previously described patchy ground-glass opacities in the  lower lungs have resolved. 3. Evidence of prior granulomatous disease with calcified left mediastinal and left hilar adenopathy and calcified granulomas in the left lung, liver and spleen. These findings are more likely due to remote granulomatous infection, although could be due to reported sarcoidosis. 4. One vessel coronary atherosclerosis. 5. Aortic Atherosclerosis (ICD10-I70.0). Electronically Signed   By: Ilona Sorrel M.D.   On: 02/07/2020 15:56    Cardiac Studies   02/07/2020: TTE IMPRESSIONS   1. Left ventricular ejection fraction, by estimation, is 35 to 40%. The  left ventricle has moderately decreased function. The left ventricle  demonstrates global hypokinesis. The left ventricular internal cavity size  was mildly dilated. Left ventricular  diastolic parameters are consistent with Grade I diastolic dysfunction  (impaired relaxation).   2. Right ventricular systolic function is normal. The right ventricular  size is normal. There is normal pulmonary artery  systolic pressure.   3. The mitral valve is normal in structure. Mild mitral valve  regurgitation. No evidence of mitral stenosis.   4. The aortic valve is normal in structure. Aortic valve regurgitation is  mild to moderate. Mild to moderate aortic valve sclerosis/calcification is  present, without any evidence of aortic stenosis. Aortic regurgitation PHT  measures 456 msec. Aortic  valve mean gradient measures 9.0 mmHg. Aortic valve Vmax measures 1.93  m/s.   5. The inferior vena cava is normal in size with greater than 50%  respiratory variability, suggesting right atrial pressure of 3 mmHg.    01/26/20: PET FINAL COMMENTS   Cardiac PET with concurrent CT was performed.   Myocardial perfusion Rb-82 imaging demonstrates perfusion defects in 5 segments.   Metabolic imaging performed with FDG PET and demonstrates mild to moderate abnormal uptake in the 5/17 segments   demonstrating perfusion abnormalities.   FDG PET/CT  chest and upper abdomen: no evidence of metabolically active sarcoid in the lymph nodes, no metabolically active   pulmonary opacities. Calcified pulmonary nodules, hilar lymph nodes and calcifications in the spleen are consistent with   sequela of granulomatous disease.   Impression:   1. Findings in the myocardium are consistent with active inflammation.   2. No evidence of metabolically active extracardiac sarcoidosis, granulomatous disease in the chest and abdomen consistent   with treated granulomas.      12/01/2019: R/LHC 1. Elevated right and left heart filling pressures.  2. Low cardiac output on norepinephrine 3.  3. Mixed pulmonary venous/pulmonary arterial hypertension.  4. No significant coronary disease.  5. Successful IABP placement.      11/27/2019: TTE IMPRESSIONS  1. Left ventricular ejection fraction, by estimation, is 20 to 25%. The  left ventricle has severely decreased function. The left ventricle  demonstrates regional wall motion abnormalities (see scoring  diagram/findings for description). The left  ventricular internal cavity size was mildly dilated. Left ventricular  diastolic parameters are indeterminate.   2. Right ventricular systolic function is normal. The right ventricular  size is mildly enlarged. There is moderately elevated pulmonary artery  systolic pressure. The estimated right ventricular systolic pressure is  01.6 mmHg.   3. Left atrial size was mildly dilated.   4. Right atrial size was moderately dilated.   5. The mitral valve is normal in structure. Moderate mitral valve  regurgitation.   6. Tricuspid valve regurgitation is moderate to severe.   7. The aortic valve is grossly normal. Aortic valve regurgitation is  mild.   8. The inferior vena cava is normal in size with <50% respiratory  variability, suggesting right atrial pressure of 8 mmHg.   Patient Profile     54 y.o. female with a hx of HTN, HLD, DM, CHB w/PPM > ICD, No  obstructive CAD, subclinical breif AFib by device, developed NICM suspected sarcoid with cardiac sarcoid on methotrexate and prednisone outpatient,  R IJ DVT   Admitted with VT/ICD shocks   Recent events: 09/2018 - Echo EF 60-65%, mild LVH, RV normal. Mild- mod AI. No stenosis 02/2019 - developed complete heart block, s/p PPM 08/2019 - COVID 19 infection, did not require hospitalization 11/26/2019 - acute SOB, acute heart failure, tele showing episodes of VT, Amio for suppression 11/2019 - Echo EF newly decreased to 20-25% with septal-lateral dyssynchrony and septal severe hypokinesis, normal RV, moderate TR, moderate MR 11/28/2019 - cardiogenic shock requiring pressors 12/01/2019 - IABP placement, LHC/RHC with no significant CAD, elevated filling pressures, and low  cardiac output on norepinephrine 12/02/2019 - Torsades/VT x 3 with prolonged QT required DC-CV. Amio switched to lido.  12/04/2019 - IABP removed, hematoma right groin, Lidocaine stopped 12/05/2019 - CMR showed patchy LGE in the basal septum, basal-mid inferoseptum/inferior wall, basal anterior wall, mid anterolateral wall. Cardiac MRI LGE pattern could be consistent with cardiac sarcoidosis  12/19/2019 - admitted with prolonged QT and ventricular tachycardia. PPM changed to BiV ICD. 12/2019 - Right groin infection, MRSA on culture, treated with Linezolid, Rt IJ DVT (placed on Eliquis) 01/08/2020 - episode of shingles, given acyclovir and doxycycline Sep 2021 - Mexitil stopped due to nausea, Amio restarted   She saw Dr. Lovena Le 8/31 in the office, noted more VT, planned to stop mexiletine (w/nausea) and start amiodarone, device programmed VT one zone to deliver both burst and ramp.  A f/u note by nursing on 9/14 reports also stopped dig.  Assessment & Plan    1. VT     Recurrent and not new for her   Lengthy review of her device interrogation noted true VT, though rate fairly slow 130's-150's (and asymptomatic) Unfortunately she was  shocked for this slow VT, and her device reprogrammed yesterday 2 zone device VF >194bpm ATP before charge 25J, 35J x5 VT 128-194bpm ATP therapies only   EKG today reviewed, QT remains acceptable Will transition to oral amiodarone today Dr. Lovena Le has seen and examined the patient this AM     2. NICM 3. Chronic CHF      Discussed with Dr. Aundra Dubin, they will see her      Continue home meds, PET scan reviewed, feels she has active cardiac sarcoid, will come on board and help manage her      Dig was stopped out pt via Dr. Lovena Le, Dr. Aundra Dubin will discuss with dr. Lovena Le   HF team is on board as well as pulmonary Planned for bronch today and RHC tomorrow   BMET pending this AM   4. PAFib     H/o R IJ DVT     eliquis > heparin for procedures tomorrow    5. DM     SSI while here (mod scale given steroids)    For questions or updates, please contact [place] Please consult [email or web address] for contact info under     Signed, Baldwin [place], PA-C  02/08/2020, 8:24 AM    EP Attending  Patient seen and examined. Agree with above. The patient is stable and has tolerated IV amio. She will be switched to oral amiodarone. She can be discharged from an EP perspective tomorrow.  Carleene Overlie Dinero Chavira,MD

## 2020-02-08 NOTE — Transfer of Care (Addendum)
Immediate Anesthesia Transfer of Care Note  Patient: Lisa Poole  Procedure(s) Performed: VIDEO BRONCHOSCOPY WITH FLUORO (N/A ) BIOPSY BRONCHIAL WASHINGS  Patient Location: PACU  Anesthesia Type:General  Level of Consciousness: awake, alert  and oriented  Airway & Oxygen Therapy: Patient Spontanous Breathing and Patient connected to nasal cannula oxygen  Post-op Assessment: Report given to RN, Post -op Vital signs reviewed and stable and Patient moving all extremities  Post vital signs: Reviewed and stable  Last Vitals:  Vitals Value Taken Time  BP 112/74 02/08/20 0900  Temp 36.1 C 02/08/20 0900  Pulse 69 02/08/20 0908  Resp 13 02/08/20 0908  SpO2 100 % 02/08/20 0908  Vitals shown include unvalidated device data.  Last Pain:  Vitals:   02/08/20 0900  TempSrc:   PainSc: Asleep      Patients Stated Pain Goal: 0 (09/47/09 6283)  Complications: No complications documented.

## 2020-02-08 NOTE — H&P (Signed)
[facility] PCCM  CC: ICD firing HPI: 54 y/o female with advanced heart failure of uncertain etiology, inflammatory myocardial infiltrate on cardiac PET and MRI admitted with ICD firing. Cardiology team worried she has cardiac sarcoid and requests tissue diagnosis. She feels well this morning.  Past Medical History:  Diagnosis Date  . Aortic insufficiency   . Asthma 09/13/2006  . Complete heart block ([place]) 02/11/2019  . Cystocele without uterine prolapse 12/12/2018  . Diabetes mellitus without complication ([place])   . Elevated troponin level   . Hypercholesteremia   . Hypertension   . Primary osteoarthritis of left knee 12/10/2017     Family History  Problem Relation Age of Onset  . Colon cancer Maternal Grandmother   . Alzheimer's disease Mother      Social History   Socioeconomic History  . Marital status: Married    Spouse name: Not on file  . Number of children: Not on file  . Years of education: Not on file  . Highest education level: Not on file  Occupational History  . Not on file  Tobacco Use  . Smoking status: Never Smoker  . Smokeless tobacco: Never Used  Vaping Use  . Vaping Use: Never used  Substance and Sexual Activity  . Alcohol use: No  . Drug use: No  . Sexual activity: Yes    Birth control/protection: Post-menopausal  Other Topics Concern  . Not on file  Social History Narrative  . Not on file   Social Determinants of Health   Financial Resource Strain:   . Difficulty of Paying Living Expenses: Not on file  Food Insecurity:   . Worried About Charity fundraiser in the Last Year: Not on file  . Ran Out of Food in the Last Year: Not on file  Transportation Needs:   . Lack of Transportation (Medical): Not on file  . Lack of Transportation (Non-Medical): Not on file  Physical Activity:   . Days of Exercise per Week: Not on file  . Minutes of Exercise per Session: Not on file  Stress:   . Feeling of Stress : Not on file  Social Connections:   . Frequency of  Communication with Friends and Family: Not on file  . Frequency of Social Gatherings with Friends and Family: Not on file  . Attends Religious Services: Not on file  . Active Member of Clubs or Organizations: Not on file  . Attends Archivist Meetings: Not on file  . Marital Status: Not on file  Intimate Partner Violence:   . Fear of Current or Ex-Partner: Not on file  . Emotionally Abused: Not on file  . Physically Abused: Not on file  . Sexually Abused: Not on file     Allergies  Allergen Reactions  . Lisinopril Other (See Comments) and Cough    Flu like symptoms, also   . Ace Inhibitors Other (See Comments)    Flu-like symptoms  . Augmentin [Amoxicillin-Pot Clavulanate] Nausea And Vomiting  . Benazepril Other (See Comments)    Flu like symptoms  . Oxycodone-Acetaminophen Nausea And Vomiting  . Sulfa Antibiotics Nausea And Vomiting     @[email or web address] @  Vitals:   02/07/20 2347 02/08/20 0110 02/08/20 0619 02/08/20 0709  BP: (!) 88/60 100/71 109/74 99/65  Pulse: 70 70 71 69  Resp: 14 16 16 17   Temp: 97.7 F (36.5 C)  97.8 F (36.6 C) 97.7 F (36.5 C)  TempSrc: Oral  Oral Temporal  SpO2: 97%  96% 98%  Weight:   74.7 kg   Height:       General:  Resting comfortably in bed HENT: NCAT OP clear PULM: symmetric chest rise, normal effort CV: warm, well perfused GI: soft, nontender MSK: normal bulk and tone Neuro: awake, alert, no distress, MAEW  CBC    Component Value Date/Time   WBC 12.8 (H) 02/08/2020 0347   RBC 4.95 02/08/2020 0347   HGB 13.8 02/08/2020 0347   HCT 44.2 02/08/2020 0347   PLT 231 02/08/2020 0347   MCV 89.3 02/08/2020 0347   MCH 27.9 02/08/2020 0347   MCHC 31.2 02/08/2020 0347   RDW 21.7 (H) 02/08/2020 0347   LYMPHSABS 0.6 (L) 02/05/2020 2307   MONOABS 0.3 02/05/2020 2307   EOSABS 0.0 02/05/2020 2307   BASOSABS 0.0 02/05/2020 2307   BMET    Component Value Date/Time   NA 138 02/08/2020 0347   K 5.0 02/08/2020 0347   CL 103  02/08/2020 0347   CO2 23 02/08/2020 0347   GLUCOSE 217 (H) 02/08/2020 0347   BUN 25 (H) 02/08/2020 0347   CREATININE 0.84 02/08/2020 0347   CALCIUM 9.3 02/08/2020 0347   GFRNONAA >60 02/08/2020 0347   GFRAA >60 02/08/2020 0347   HRCT scan from 9/30 > images personally reviewed> 64mm LUL nodule noted, needs attention on repeat in 3 months. No pulmonary parenchymal infiltrate.  Calcified mediastinal nodes but no adenopathy.  Impression Inflammatory myocardium with depressed systolic function Concern for sarcoidosis  Plan: Transbronchial biopsies to assess for possible granulomas  Roselie Awkward, MD [place] [facility] Pager: [phone] Cell: [phone] If no response, call [phone]

## 2020-02-08 NOTE — Progress Notes (Signed)
[place] for apixaban>heparin Indication: Hx RIJ clot  12/20/19   Allergies  Allergen Reactions  . Lisinopril Other (See Comments) and Cough    Flu like symptoms, also   . Ace Inhibitors Other (See Comments)    Flu-like symptoms  . Augmentin [Amoxicillin-Pot Clavulanate] Nausea And Vomiting  . Benazepril Other (See Comments)    Flu like symptoms  . Oxycodone-Acetaminophen Nausea And Vomiting  . Sulfa Antibiotics Nausea And Vomiting    Patient Measurements: Height: 5' (152.4 cm) Weight: 74.7 kg (164 lb 9.6 oz) IBW/kg (Calculated) : 45.5 Heparin Dosing Weight: 62kg  Vital Signs: Temp: 98.7 F (37.1 C) (09/30 1622) Temp Source: Oral (09/30 1622) BP: 95/64 (09/30 1622) Pulse Rate: 80 (09/30 1622)  Labs: Recent Labs    02/05/20 2307 02/05/20 2307 02/06/20 2233 02/07/20 0704 02/07/20 1350 02/08/20 0347 02/08/20 1822  HGB 14.7   < >  --  14.0 15.0 13.8  --   HCT 47.4*  --   --  45.1  --  44.2  --   PLT 257  --   --  196  --  231  --   APTT  --   --  52* 101*  --   --  62*  HEPARINUNFRC  --   --   --  1.12*  --   --  0.74*  CREATININE 1.34*  --   --  0.82  --  0.84  --    < > = values in this interval not displayed.    Estimated Creatinine Clearance: 68.3 mL/min (by C-G formula based on SCr of 0.84 mg/dL).    Assessment: 89 yof with hx of Afib/RIJ clot in Aug - apixaban last received on 9/28 @0405 . Eliquis changed to heparin on 9/28 for planned bronch tomorrow (9/30)  Heparin was held at midnight for bronch biopsy this morning. Per discussion with Dr. Lake Bells, pt tolerated procedure well and ok to restart anticoagulation. Per discussion with Dr. Aundra Dubin, will hold off on restarting Eliquis given RHC planned for 10/1. Will resume heparin at prior drip rate.  Initial heparin level still altered by DOAC, aPTT slightly below goal at 62 seconds.  Goal of Therapy:  Heparin level 0.3-0.7 units/ml aPTT 66-102 seconds Monitor  platelets by anticoagulation protocol: Yes   Plan:  Increase heparin to 1050 units/h Recheck heparin level with daily labs   Arrie Senate, PharmD, BCPS Clinical Pharmacist [phone] Please check AMION for all [place] numbers 02/08/2020

## 2020-02-08 NOTE — Progress Notes (Signed)
[place] for apixaban>heparin Indication: Hx RIJ clot  12/20/19   Allergies  Allergen Reactions  . Lisinopril Other (See Comments) and Cough    Flu like symptoms, also   . Ace Inhibitors Other (See Comments)    Flu-like symptoms  . Augmentin [Amoxicillin-Pot Clavulanate] Nausea And Vomiting  . Benazepril Other (See Comments)    Flu like symptoms  . Oxycodone-Acetaminophen Nausea And Vomiting  . Sulfa Antibiotics Nausea And Vomiting    Patient Measurements: Height: 5' (152.4 cm) Weight: 74.7 kg (164 lb 9.6 oz) IBW/kg (Calculated) : 45.5 Heparin Dosing Weight: 62kg  Vital Signs: Temp: 97.7 F (36.5 C) (09/30 1116) Temp Source: Temporal (09/30 0709) BP: 83/62 (09/30 1116) Pulse Rate: 69 (09/30 0942)  Labs: Recent Labs    02/05/20 2307 02/05/20 2307 02/06/20 2233 02/07/20 0704 02/08/20 0347  HGB 14.7   < >  --  14.0 13.8  HCT 47.4*  --   --  45.1 44.2  PLT 257  --   --  196 231  APTT  --   --  52* 101*  --   HEPARINUNFRC  --   --   --  1.12*  --   CREATININE 1.34*  --   --  0.82 0.84   < > = values in this interval not displayed.    Estimated Creatinine Clearance: 68.3 mL/min (by C-G formula based on SCr of 0.84 mg/dL).    Assessment: 2 yof with hx of Afib/RIJ clot in Aug - apixaban last received on 9/28 @0405 . Eliquis changed to heparin on 9/28 for planned bronch tomorrow (9/30)  Heparin was held at midnight for bronch biopsy this morning. Per discussion with Dr. [place] Bells, pt tolerated procedure well and ok to restart anticoagulation. Per discussion with Dr. Aundra Dubin, will hold off on restarting Eliquis given RHC planned for 10/1. Will resume heparin at prior drip rate.   Goal of Therapy:  Heparin level 0.3-0.7 units/ml aPTT 66-102 seconds Monitor platelets by anticoagulation protocol: Yes   Plan:  Restart heparin at 950 units/hr Check 6-hr HL/aPTT  F/u restart Eliquis after RHC tomorrow Monitor daily aPTT, HL, CBC/plt  while on heparin Monitor for signs/symptoms of bleeding   Richardine Service, PharmD PGY2 Cardiology Pharmacy Resident Phone: [phone] 02/08/2020  1:37 PM  Please check [email or web address] for unit-specific pharmacy phone numbers.

## 2020-02-08 NOTE — Anesthesia Procedure Notes (Signed)
Procedure Name: Intubation Date/Time: 02/08/2020 8:16 AM Performed by: Colin Benton, CRNA Pre-anesthesia Checklist: Patient identified, Emergency Drugs available, Suction available and Patient being monitored Patient Re-evaluated:Patient Re-evaluated prior to induction Oxygen Delivery Method: Circle system utilized Preoxygenation: Pre-oxygenation with 100% oxygen Induction Type: IV induction Ventilation: Mask ventilation without difficulty and Oral airway inserted - appropriate to patient size Laryngoscope Size: Mac and 4 Grade View: Grade I Tube type: Oral Tube size: 8.5 mm Number of attempts: 1 Airway Equipment and Method: Stylet Placement Confirmation: positive ETCO2,  ETT inserted through vocal cords under direct vision and breath sounds checked- equal and bilateral Secured at: 21 cm Tube secured with: Tape Dental Injury: Teeth and Oropharynx as per pre-operative assessment

## 2020-02-08 NOTE — Op Note (Signed)
PCCM Video Bronchoscopy Procedure Note  The patient was informed of the risks (including but not limited to bleeding, infection, respiratory failure, lung injury, tooth/oral injury) and benefits of the procedure and gave consent, see chart.  Indication: possible cardiac sarcoidosis  Post Procedure Diagnosis: biopsy  Location: [facility]  Condition pre procedure: stable  Medications for procedure: general anesthesia  Procedure description: The bronchoscope was introduced through the endotracheal tube and passed to the bilateral lungs to the level of the subsegmental bronchi throughout the tracheobronchial tree.  Airway exam revealed normal tracheobronchial tree bilaterally without endobronchial lesion, inflammation or masses.    Procedures performed:  1) Transbronchial biopsy in anterior segment of right lower lobe 8 total 2)  BAL anterior segment of RLL  Specimens sent: routine histology of biopsy, bal for bacterial, fungal, afb culture; cell count with diff  Condition post procedure: stable  EBL: < 44RX  Complications: none immediate  Roselie Awkward, MD [place] Pager: [phone] Cell: [phone] If no response, call [phone]

## 2020-02-08 NOTE — Progress Notes (Signed)
Manual blood pressure 88/60.  Patient asymptomatic.  Entresto held this evening per verbal order from Dr. Kalman Shan, on call with Cardiology

## 2020-02-08 NOTE — Progress Notes (Signed)
Patient had seven beats of wide QRS at about 0421.  Patient was asymptomatic.

## 2020-02-09 ENCOUNTER — Encounter (HOSPITAL_COMMUNITY): Payer: Self-pay | Admitting: Cardiology

## 2020-02-09 ENCOUNTER — Encounter (HOSPITAL_COMMUNITY)
Admission: EM | Disposition: A | Discharge status: Home / Self Care | Payer: Self-pay | Source: Home / Self Care | Attending: Cardiology

## 2020-02-09 ENCOUNTER — Other Ambulatory Visit (HOSPITAL_COMMUNITY): Payer: Self-pay | Admitting: Cardiology

## 2020-02-09 DIAGNOSIS — E1369 Other specified diabetes mellitus with other specified complication: Secondary | ICD-10-CM

## 2020-02-09 HISTORY — PX: RIGHT HEART CATH: CATH118263

## 2020-02-09 LAB — POCT I-STAT EG7
Acid-Base Excess: 5 mmol/L — ABNORMAL HIGH (ref 0.0–2.0)
Acid-Base Excess: 5 mmol/L — ABNORMAL HIGH (ref 0.0–2.0)
Bicarbonate: 29.3 mmol/L — ABNORMAL HIGH (ref 20.0–28.0)
Bicarbonate: 30 mmol/L — ABNORMAL HIGH (ref 20.0–28.0)
Calcium, Ion: 1.13 mmol/L — ABNORMAL LOW (ref 1.15–1.40)
Calcium, Ion: 1.2 mmol/L (ref 1.15–1.40)
HCT: 42 % (ref 36.0–46.0)
HCT: 44 % (ref 36.0–46.0)
Hemoglobin: 14.3 g/dL (ref 12.0–15.0)
Hemoglobin: 15 g/dL (ref 12.0–15.0)
O2 Saturation: 73 %
O2 Saturation: 74 %
Potassium: 3.8 mmol/L (ref 3.5–5.1)
Potassium: 4 mmol/L (ref 3.5–5.1)
Sodium: 138 mmol/L (ref 135–145)
Sodium: 140 mmol/L (ref 135–145)
TCO2: 31 mmol/L (ref 22–32)
TCO2: 31 mmol/L (ref 22–32)
pCO2, Ven: 41.9 mmHg — ABNORMAL LOW (ref 44.0–60.0)
pCO2, Ven: 43.2 mmHg — ABNORMAL LOW (ref 44.0–60.0)
pH, Ven: 7.45 — ABNORMAL HIGH (ref 7.250–7.430)
pH, Ven: 7.453 — ABNORMAL HIGH (ref 7.250–7.430)
pO2, Ven: 37 mmHg (ref 32.0–45.0)
pO2, Ven: 37 mmHg (ref 32.0–45.0)

## 2020-02-09 LAB — CBC
HCT: 44.7 % (ref 36.0–46.0)
Hemoglobin: 14 g/dL (ref 12.0–15.0)
MCH: 27.4 pg (ref 26.0–34.0)
MCHC: 31.3 g/dL (ref 30.0–36.0)
MCV: 87.5 fL (ref 80.0–100.0)
Platelets: 210 10*3/uL (ref 150–400)
RBC: 5.11 MIL/uL (ref 3.87–5.11)
RDW: 21.4 % — ABNORMAL HIGH (ref 11.5–15.5)
WBC: 8 10*3/uL (ref 4.0–10.5)
nRBC: 0 % (ref 0.0–0.2)

## 2020-02-09 LAB — HEPARIN LEVEL (UNFRACTIONATED): Heparin Unfractionated: 1 IU/mL — ABNORMAL HIGH (ref 0.30–0.70)

## 2020-02-09 LAB — APTT: aPTT: 115 seconds — ABNORMAL HIGH (ref 24–36)

## 2020-02-09 LAB — BASIC METABOLIC PANEL
Anion gap: 10 (ref 5–15)
BUN: 21 mg/dL — ABNORMAL HIGH (ref 6–20)
CO2: 25 mmol/L (ref 22–32)
Calcium: 9.1 mg/dL (ref 8.9–10.3)
Chloride: 100 mmol/L (ref 98–111)
Creatinine, Ser: 0.73 mg/dL (ref 0.44–1.00)
GFR calc Af Amer: >60 mL/min (ref >60)
GFR calc non Af Amer: >60 mL/min (ref >60)
Glucose, Bld: 211 mg/dL — ABNORMAL HIGH (ref 70–99)
Potassium: 4 mmol/L (ref 3.5–5.1)
Sodium: 135 mmol/L (ref 135–145)

## 2020-02-09 LAB — SURGICAL PATHOLOGY

## 2020-02-09 LAB — GLUCOSE, CAPILLARY
Glucose-Capillary: 152 mg/dL — ABNORMAL HIGH (ref 70–99)
Glucose-Capillary: 165 mg/dL — ABNORMAL HIGH (ref 70–99)
Glucose-Capillary: 339 mg/dL — ABNORMAL HIGH (ref 70–99)

## 2020-02-09 SURGERY — RIGHT HEART CATH
Anesthesia: LOCAL

## 2020-02-09 MED ORDER — HEPARIN (PORCINE) IN NACL 1000-0.9 UT/500ML-% IV SOLN
INTRAVENOUS | Status: AC
  Filled 2020-02-09: qty 500

## 2020-02-09 MED ORDER — DIGOXIN 125 MCG PO TABS: 0.1250 mg | ORAL_TABLET | Freq: Every day | ORAL | 5 refills | Status: DC

## 2020-02-09 MED ORDER — AMIODARONE HCL 200 MG PO TABS: 200.0000 mg | ORAL_TABLET | Freq: Two times a day (BID) | ORAL | 5 refills | Status: DC

## 2020-02-09 MED ORDER — APIXABAN 5 MG PO TABS: 5.0000 mg | ORAL_TABLET | Freq: Two times a day (BID) | ORAL | Status: DC

## 2020-02-09 MED ORDER — SPIRONOLACTONE 25 MG PO TABS: 25.0000 mg | ORAL_TABLET | Freq: Every day | ORAL | 5 refills | Status: DC

## 2020-02-09 MED ORDER — SODIUM CHLORIDE 0.9 % IV SOLN: 250.0000 mL | INTRAVENOUS | Status: DC | PRN

## 2020-02-09 MED ORDER — ASPIRIN 81 MG PO CHEW
81.0000 mg | CHEWABLE_TABLET | ORAL | Status: AC
  Administered 2020-02-09: 81 mg via ORAL
  Filled 2020-02-09: qty 1

## 2020-02-09 MED ORDER — SULFAMETHOXAZOLE-TRIMETHOPRIM 800-160 MG PO TABS: 1.0000 | ORAL_TABLET | ORAL | 5 refills | Status: AC

## 2020-02-09 MED ORDER — LIDOCAINE HCL (PF) 1 % IJ SOLN
INTRAMUSCULAR | Status: DC | PRN
  Administered 2020-02-09: 2 mL

## 2020-02-09 MED ORDER — SODIUM CHLORIDE 0.9% FLUSH
3.0000 mL | INTRAVENOUS | Status: DC | PRN
  Administered 2020-02-09: 3 mL via INTRAVENOUS

## 2020-02-09 MED ORDER — PREDNISONE 20 MG PO TABS: 60.0000 mg | ORAL_TABLET | Freq: Every day | ORAL | 5 refills | Status: AC

## 2020-02-09 MED ORDER — LIDOCAINE HCL (PF) 1 % IJ SOLN
INTRAMUSCULAR | Status: AC
  Filled 2020-02-09: qty 30

## 2020-02-09 MED ORDER — HEPARIN (PORCINE) IN NACL 1000-0.9 UT/500ML-% IV SOLN
INTRAVENOUS | Status: DC | PRN
  Administered 2020-02-09: 500 mL

## 2020-02-09 MED ORDER — DAPAGLIFLOZIN PROPANEDIOL 10 MG PO TABS: 10.0000 mg | ORAL_TABLET | Freq: Every day | ORAL | 5 refills | Status: DC

## 2020-02-09 MED ORDER — CARVEDILOL 3.125 MG PO TABS: 3.1250 mg | ORAL_TABLET | Freq: Two times a day (BID) | ORAL | 5 refills | Status: DC

## 2020-02-09 MED ORDER — COOL BLOOD GLUCOSE TEST STRIPS VI STRP: ORAL_STRIP | 12 refills | Status: AC

## 2020-02-09 MED ORDER — PEN NEEDLES 32G X 4 MM MISC: 1.0000 [IU] | Freq: Every day | 11 refills | Status: DC

## 2020-02-09 MED ORDER — INSULIN DETEMIR 100 UNIT/ML FLEXPEN: 7.0000 [IU] | PEN_INJECTOR | Freq: Every day | SUBCUTANEOUS | 11 refills | Status: DC

## 2020-02-09 MED ORDER — SODIUM CHLORIDE 0.9% FLUSH: 3.0000 mL | Freq: Two times a day (BID) | INTRAVENOUS | Status: DC

## 2020-02-09 MED ORDER — INSULIN ASPART 100 UNIT/ML FLEXPEN: 5.0000 [IU] | PEN_INJECTOR | Freq: Three times a day (TID) | SUBCUTANEOUS | 11 refills | Status: DC

## 2020-02-09 MED ORDER — SACUBITRIL-VALSARTAN 24-26 MG PO TABS: 1.0000 | ORAL_TABLET | Freq: Two times a day (BID) | ORAL | 5 refills | Status: DC

## 2020-02-09 MED ORDER — METHOTREXATE SODIUM 2.5 MG PO TABS: ORAL_TABLET | ORAL | 1 refills | Status: DC

## 2020-02-09 MED ORDER — SODIUM CHLORIDE 0.9 % IV SOLN: INTRAVENOUS | Status: DC

## 2020-02-09 MED FILL — PENTIPS 32G X 4 MM MISC: 32G X 4 MM | 30 days supply | Qty: 100 | Fill #0

## 2020-02-09 MED FILL — SULFAMETHOXAZOLE-TMP DS TAB: 800-160 | 74 days supply | Qty: 30 | Fill #0

## 2020-02-09 MED FILL — ENTRESTO 24 MG-26 MG TABLET: 24-26 | 30 days supply | Qty: 60 | Fill #0

## 2020-02-09 MED FILL — HUMALOG 100 UNITS/ML KWIKPE: 100 | 30 days supply | Qty: 6 | Fill #0

## 2020-02-09 MED FILL — CARVEDILOL 3.125 MG TABLET: 3.125 | 30 days supply | Qty: 60 | Fill #0

## 2020-02-09 MED FILL — DIGOXIN 0.125 MG TABLET: 125 | 30 days supply | Qty: 30 | Fill #0

## 2020-02-09 MED FILL — METHOTREXATE 2.5 MG TABLET: 2.5 | 28 days supply | Qty: 24 | Fill #0

## 2020-02-09 MED FILL — LEVEMIR FLEXTOUCH 100 UNITS: 100 | 42 days supply | Qty: 3 | Fill #0

## 2020-02-09 SURGICAL SUPPLY — 8 items
CATH BALLN WEDGE 5F 110CM (CATHETERS) ×1 IMPLANT
GUIDEWIRE .025 260CM (WIRE) ×1 IMPLANT
KIT HEART LEFT (KITS) ×2 IMPLANT
PACK CARDIAC CATHETERIZATION (CUSTOM PROCEDURE TRAY) ×2 IMPLANT
PROTECTION STATION PRESSURIZED (MISCELLANEOUS) ×2
SHEATH GLIDE SLENDER 4/5FR (SHEATH) ×1 IMPLANT
STATION PROTECTION PRESSURIZED (MISCELLANEOUS) IMPLANT
TRANSDUCER W/STOPCOCK (MISCELLANEOUS) ×2 IMPLANT

## 2020-02-09 NOTE — Discharge Summary (Signed)
Advanced Heart Failure Team  Discharge Summary   Patient ID: Lisa Poole MRN: [id number], DOB/AGE: [birth year] 55 y.o. Admit date: 02/05/2020 D/C date:     02/09/2020   Primary Discharge Diagnoses:  VT w/ ICD Shock  Cardiac Sarcoidosis  Chronic Immunosuppressive Therapy  Chronic Systolic Heart Failure / NICM H/o CHB s/p CRT H/o Rt IJ DVT Chronic Anticoagulation Therapy (Eliquis)  Type Insulin Dependent Diabetes Mellitus   Hospital Course:   Mrs  Lisa Poole is a 54 year old with a history of HTN, HLD, T2DM, CHB s/p PPM implant 02/2019 followed by Dr. Lovena Le, PAF (not on a/c), aortic insufficiency,COVID 19 06/3534 chronic systolic heart failure, NICM, VT, and CHB.   In May 2020, she underwent w/u for SOB and mildly elevated troponin.LHC 09/2018 showed no significant coronary disease.Echo 09/2018. LVEF 60-65%, mild LVH, RV normal. Mild- mod AI. No stenosis.  In 02/2019, she presented w/ chest pain and found to be in CHB and underwentPPMinplant ([employer]). She is followed by Dr. Lovena Le. EP notes outline detection of PAF but episodes have been very brief, thus she has not beenon anticoagulation.   In April 2021, she was diagnosed w/ COVID but did not require hospitalization. She has not been vaccinated.   Admitted 05/4429 with A/C Systolic heart failure. EKG and tele w/ WCT, felt to be VT.  Started on IV amiodarone and lasix gtt.Echowasrepeated and showednew drop in LVEF, down to 20-25%. Hospital course complicated by cardiogenic shock. Placed on milrinone and IABP. Developed Torsades/VT x 3 on 7/24 with prolonged QT required DC-CV. Amio switched to lido. Lido stopped 7/26 by Dr. Caryl Comes.Started onmexiletine.HadrepeatLHC that showed no significant coronary disease. cMRI c/w possible cardiac sarcoidosis vs prior myocarditis. She was placed on empiric prednisone for possible sarcoid and referred to [facility] for cardiac PET scan. Received 1 gram solumedrol IV x 5 days followed by  prednisone. Dr Caryl Comes start methotrexate 10 mg weekly. For VT plan was to continue nnMexiletine for maintenance therapy. She underwent device upgrade to  CRT-D prior to d/c. Discharged to home on 12/26/19   Followed in the HF clinic and was last seen 12/15/19 for post hospital visit. Coreg was increased to 6.25 mg twice a day.  Stable from HF perspective.  She was set up for PET scan.  Sarcoid Treatment - 12/20/19 MTX 10 mg weekly  - 12/20/19 Received 1 gram solumedrol IV x 5 days  - 8/16 Prednisone 60 mg daily - 01/30/20 Prednisone cut back to 40 mg daily   She presented to ED on 02/06/20 after ICD shock x2. Says she was in the bathroom sitting on the commode and was shocked x1 then about 3 minutes later shocked again.  Of note, Mexitil and digoxin stopped 01/16/20 due to nausea and amio was restarted. Device interrogation confirmed appropriate shock for VT.  Pertinent admission labs included: Mag 1.9, WBC 8, glucose 446, creatinine 1.3, BNP 334, and Sars 2 negative. She was admitted and placed on amio drip. Prednisone increased back to 60 mg daily.   She had no further VT. Transitioned to PO amio and digoxin added back. Mexiletine was not restarted (felt to be culprit for nausea).  Had [place] prior d/c showing low filling pressures and preserved CO. Echo showed slightly improved EF, compared to prior study, up to 35-40% (previous 20-25%).   Hospitalization also notable for hyperglycemia/ DM from prednisone. Diabetes coordinator consulted for medication management. Recs were to d/c on insulin and refer to outpatient endocrinology.   On 10/1, she was  seen and examined by Dr. Aundra Dubin and felt stable for discharge home. She will continue on weekly methotrexate w/ plans to gradually increase every 2 weeks to goal 20-25 mg. She will continue Bactrim 3 days/week for PJP PNA prophylaxis. She will continue prednisone 60 mg daily. EP recommended amiodarone 200 mg bid until her f/u w/ Dr. Quentin Ore on 10/20. She has post  hospital f/u arranged w/ Dr. Aundra Dubin on 10/18. Referral was placed on endocrinology for her to see Dr. Cruzita Lederer.    2D echo 02/06/20 Left ventricular ejection fraction, by estimation, is 35 to 40%. The left ventricle has moderately decreased function. The left ventricle demonstrates global hypokinesis. The left ventricular internal cavity size was mildly dilated. Left ventricular diastolic parameters are consistent with Grade I diastolic dysfunction (impaired relaxation). 2. Right ventricular systolic function is normal. The right ventricular size is normal. There is normal pulmonary artery systolic pressure. 3. The mitral valve is normal in structure. Mild mitral valve regurgitation. No evidence of mitral stenosis. 4. The aortic valve is normal in structure. Aortic valve regurgitation is mild to moderate. Mild to moderate aortic valve sclerosis/calcification is present, without any evidence of aortic stenosis. Aortic regurgitation PHT measures 456 msec. Aortic valve mean gradient measures 9.0 mmHg. Aortic valve Vmax measures 1.93 m/s. 5. The inferior vena cava is normal in size with greater than 50% respiratory variability, suggesting right atrial pressure of 3 mmHg.  Comparison(s): Prior images reviewed side by side. Changes from prior study are noted. The left ventricular function has improved. Prior EF 20-25%.  Right Heart  Right Heart Pressures RHC Procedural Findings: Hemodynamics (mmHg) RA mean 1 RV 15/1 PA 13/5, mean 9 PCWP mean 3  Oxygen saturations: PA 74% AO 99%  Cardiac Output (Fick) 4.78  Cardiac Index (Fick) 2.79     Discharge Weight Range: 165 lb  Discharge Vitals: Blood pressure 90/65, pulse 81, temperature 98.6 F (37 C), temperature source Oral, resp. rate 16, height 5' (1.524 m), weight 74.8 kg, last menstrual period 07/25/2015, SpO2 98 %.  Labs: Lab Results  Component Value Date   WBC 8.0 02/09/2020   HGB 14.3 02/09/2020   HCT 42.0 02/09/2020   MCV  87.5 02/09/2020   PLT 210 02/09/2020    Recent Labs  Lab 02/07/20 0704 02/08/20 0347 02/09/20 0457 02/09/20 1248 02/09/20 1249  NA 137   < > 135   < > 140  K 3.8   < > 4.0   < > 3.8  CL 101   < > 100  --   --   CO2 26   < > 25  --   --   BUN 20   < > 21*  --   --   CREATININE 0.82   < > 0.73  --   --   CALCIUM 8.7*   < > 9.1  --   --   PROT 5.6*  --   --   --   --   BILITOT 0.8  --   --   --   --   ALKPHOS 36*  --   --   --   --   ALT 20  --   --   --   --   AST 14*  --   --   --   --   GLUCOSE 259*   < > 211*  --   --    < > = values in this interval not displayed.   Lab Results  Component Value Date   CHOL 178 09/28/2018   HDL 71 09/28/2018   LDLCALC 89 09/28/2018   TRIG 91 09/28/2018   BNP (last 3 results) Recent Labs    11/26/19 2011 02/06/20 0725  BNP 1,258.1* 344.3*    ProBNP (last 3 results) No results for input(s): PROBNP in the last 8760 hours.   Diagnostic Studies/Procedures   CARDIAC CATHETERIZATION  Result Date: 02/09/2020 1. Low filling pressures. 2. Preserved cardiac output.   DG CHEST PORT 1 VIEW  Result Date: 02/08/2020 CLINICAL DATA:  Status post bronchoscopy EXAM: PORTABLE CHEST 1 VIEW COMPARISON:  Chest radiograph December 20, 2019; chest CT February 07, 2020 FINDINGS: No pneumothorax. A pacemaker device overlies the area of nodular opacity seen on recent CT examination in the posterior segment left upper lobe. Visualized lungs appear clear. Heart size and pulmonary vascularity are normal. Pacemaker leads are attached to the right atrium, right ventricle, and coronary sinus. Several nonenlarged calcified lymph nodes are noted consistent with prior granulomatous disease. No bone lesions. IMPRESSION: No pneumothorax. Pulmonary nodular lesions seen in the left upper lobe on CT is obscured by pacemaker device. Visualized lungs appear clear. Evidence of prior granulomatous disease. Pacemaker leads attached to right atrium, right ventricle, and coronary  sinus. Electronically Signed   By: Lowella Grip III M.D.   On: 02/08/2020 09:34   DG C-ARM BRONCHOSCOPY  Result Date: 02/08/2020 C-ARM BRONCHOSCOPY: Fluoroscopy was utilized by the requesting physician.  No radiographic interpretation.    Discharge Medications   Allergies as of 02/09/2020      Reactions   Lisinopril Other (See Comments), Cough   Flu like symptoms, also   Ace Inhibitors Other (See Comments)   Flu-like symptoms   Augmentin [amoxicillin-pot Clavulanate] Nausea And Vomiting   Benazepril Other (See Comments)   Flu like symptoms   Oxycodone-acetaminophen Nausea And Vomiting   Sulfa Antibiotics Nausea And Vomiting      Medication List    STOP taking these medications   acyclovir 800 MG tablet Commonly known as: ZOVIRAX   doxycycline 100 MG tablet Commonly known as: VIBRA-TABS   fluconazole 100 MG tablet Commonly known as: DIFLUCAN   lovastatin 10 MG tablet Commonly known as: MEVACOR   mexiletine 250 MG capsule Commonly known as: MEXITIL   ondansetron 4 MG disintegrating tablet Commonly known as: Zofran ODT   sacubitril-valsartan 49-51 MG Commonly known as: ENTRESTO Replaced by: sacubitril-valsartan 24-26 MG     TAKE these medications   Accu-Chek Aviva Plus test strip Generic drug: glucose blood 3 (three) times daily.   acetaminophen 325 MG tablet Commonly known as: TYLENOL Take 2 tablets (650 mg total) by mouth every 4 (four) hours as needed for headache or mild pain.   albuterol 108 (90 Base) MCG/ACT inhaler Commonly known as: VENTOLIN HFA Inhale 1-2 puffs into the lungs every 6 (six) hours as needed for wheezing or shortness of breath.   amiodarone 200 MG tablet Commonly known as: PACERONE Take 1 tablet (200 mg total) by mouth 2 (two) times daily. What changed:   how much to take  how to take this  when to take this  additional instructions   apixaban 5 MG Tabs tablet Commonly known as: ELIQUIS Take 1 tablet (5 mg total) by  mouth 2 (two) times daily.   Biotin 10 MG Caps Take 10 mg by mouth daily.   carvedilol 3.125 MG tablet Commonly known as: COREG Take 1 tablet (3.125 mg total) by mouth 2 (two) times daily with  a meal. What changed:   medication strength  See the new instructions.   Centrum Silver 50+Women Tabs Take 1 tablet by mouth daily.   dapagliflozin propanediol 10 MG Tabs tablet Commonly known as: FARXIGA Take 1 tablet (10 mg total) by mouth daily.   digoxin 0.125 MG tablet Commonly known as: LANOXIN Take 1 tablet (0.125 mg total) by mouth daily for 7 days.   folic acid 1 MG tablet Commonly known as: FOLVITE Take 1 tablet (1 mg total) by mouth daily.   gabapentin 600 MG tablet Commonly known as: NEURONTIN Take 600 mg by mouth 2 (two) times daily.   HYDROcodone-acetaminophen 10-325 MG tablet Commonly known as: NORCO Take 1 tablet by mouth 4 (four) times daily.   insulin aspart 100 UNIT/ML FlexPen Commonly known as: NOVOLOG Inject 5 Units into the skin 3 (three) times daily with meals.   insulin detemir 100 UNIT/ML FlexPen Commonly known as: LEVEMIR Inject 7 Units into the skin daily.   metFORMIN 500 MG tablet Commonly known as: GLUCOPHAGE Take 500 mg by mouth 2 (two) times daily.   methotrexate 2.5 MG tablet Take 12.5 mg (5 tabs) on Wed 10/6.  Then take 15 mg (6 tabs) on Wed 10/13 and Wed 10/20.  Then take 17.5 mg (7 tabs) on Wed 10/27 and Wed 11/3.  Then take 20 mg (8 tabs) every Wed thereafter.  Caution:Chemotherapy. Protect from light. What changed:   medication strength  how much to take  how to take this  when to take this  additional instructions   omeprazole 40 MG capsule Commonly known as: PRILOSEC Take 40 mg by mouth daily.   Pen Needles 32G X 4 MM Misc 1 Units by Does not apply route daily.   pravastatin 10 MG tablet Commonly known as: PRAVACHOL Take 10 mg by mouth daily.   predniSONE 20 MG tablet Commonly known as: DELTASONE Take 3 tablets  (60 mg total) by mouth daily with breakfast. Start taking on: February 10, 2020 What changed: how much to take   sacubitril-valsartan 24-26 MG Commonly known as: ENTRESTO Take 1 tablet by mouth 2 (two) times daily. Replaces: sacubitril-valsartan 49-51 MG   spironolactone 25 MG tablet Commonly known as: ALDACTONE Take 1 tablet (25 mg total) by mouth daily.   sulfamethoxazole-trimethoprim 800-160 MG tablet Commonly known as: BACTRIM DS Take 1 tablet by mouth 3 (three) times a week. Start taking on: February 12, 2020   Vitamin D-3 25 MCG (1000 UT) Caps Take 1,000 Units by mouth daily.       Disposition   The patient will be discharged in stable condition to home.   Follow-up Information    Vickie Epley, MD Follow up.   Specialty: Cardiology Why: 02/28/2020 @ 1:30PM Contact information: [address] [place] [address] [phone]        Larey Dresser, MD Follow up on 02/26/2020.   Specialty: Cardiology Why: [facility] [address]  Contact information: [place] [address] [phone]        Milford Cage, PA. Schedule an appointment as soon as possible for a visit in 1 week(s).   Specialty: Physician Assistant Why: for further managment of diabetes   Contact information: [place] [address] [phone]        Philemon Kingdom, MD Follow up.   Specialty: Internal Medicine Why: a referral has been placed to Endocrinology for additional managment of diabetes  Contact information: [address] [place] [address] [phone]                 Duration of Discharge Encounter: Greater than 35 minutes   Signed, Nelida Gores 02/09/2020, 5:00 PM

## 2020-02-09 NOTE — Progress Notes (Signed)
[place] for Heparin Indication: Hx RIJ clot  12/20/19   Allergies  Allergen Reactions  . Lisinopril Other (See Comments) and Cough    Flu like symptoms, also   . Ace Inhibitors Other (See Comments)    Flu-like symptoms  . Augmentin [Amoxicillin-Pot Clavulanate] Nausea And Vomiting  . Benazepril Other (See Comments)    Flu like symptoms  . Oxycodone-Acetaminophen Nausea And Vomiting  . Sulfa Antibiotics Nausea And Vomiting    Patient Measurements: Height: 5' (152.4 cm) Weight: 74.8 kg (165 lb) IBW/kg (Calculated) : 45.5 Heparin Dosing Weight: 62kg  Vital Signs: Temp: 98.2 F (36.8 C) (10/01 0343) Temp Source: Oral (10/01 0343) BP: 97/72 (10/01 0343) Pulse Rate: 76 (10/01 0343)  Labs: Recent Labs    02/07/20 0704 02/07/20 1350 02/08/20 0347 02/08/20 1822 02/09/20 0457 02/09/20 0458  HGB 14.0   < > 13.8  --  14.0  --   HCT 45.1  --  44.2  --  44.7  --   PLT 196  --  231  --  210  --   APTT 101*  --   --  62* 115*  --   HEPARINUNFRC 1.12*  --   --  0.74*  --  1.00*  CREATININE 0.82  --  0.84  --   --   --    < > = values in this interval not displayed.    Estimated Creatinine Clearance: 68.3 mL/min (by C-G formula based on SCr of 0.84 mg/dL).    Assessment: 54 yof with hx of Afib/RIJ clot in Aug - apixaban last received on 9/28 @0405 . Eliquis changed to heparin on 9/28 for planned bronch (9/30) and RHD (10/1)  Heparin level still altered by DOAC, PTT 115 sec (supratherapeutic) on gtt at 1050 units/hr. No bleeding noted.  Goal of Therapy:  Heparin level 0.3-0.7 units/ml aPTT 66-102 seconds Monitor platelets by anticoagulation protocol: Yes   Plan:  Decrease heparin to 900 units/hr Will f/u post cath  Sherlon Handing, PharmD, BCPS Please see amion for complete clinical pharmacist phone list 02/09/2020 6:12 AM

## 2020-02-09 NOTE — Discharge Instructions (Signed)
[facility] [phone]) 1. Dr. Philemon Kingdom 2. Dr. Janie [facility] [phone]) 1. Dr. Delrae Rend [facility] [phone]) 1. Dr. Jacelyn Pi 2. Dr. Anda Kraft [facility] [phone] 1. Dr. Daneil Dolin [facility] [phone]) [[place] office]  [phone]) [[place] office] 1. Dr. Lenna Sciara [place] 2. Dr. Mee Hives [facility]) [phone]) 1. Autumn Hudnall Ronnald Ramp), PA 2. Dr. Amalia Greenhouse 3. Dr. Marsh Dolly. [facility] [phone]) 1. Dr. Glade Lloyd [facility] [phone]) 1. Dr. Orville Govern 2. Dr. Lelon Huh 3. Dr. Jerelene Redden 4. Alwyn Ren, FNP Dr. Carolynn Serve. [facility] in [place] [phone]) Hemoglobin A1c Test Why am I having this test? You may have the hemoglobin A1c test (HbA1c test) done to:  Evaluate your risk for developing diabetes (diabetes mellitus).  Diagnose diabetes.  Monitor long-term control of blood sugar (glucose) in people who have diabetes and help make treatment decisions. This test may be done with other blood glucose tests, such as fasting blood glucose and oral glucose tolerance tests. What is being tested? Hemoglobin is a type of protein in the blood that carries oxygen. Glucose attaches to hemoglobin to form glycated hemoglobin. This test checks the amount of glycated hemoglobin in your blood, which is a good indicator of the average amount of glucose in your blood during the past 2-3 months. What kind of sample is taken?  A blood sample is required for this test. It is usually collected by inserting a needle into a blood vessel. Tell a health care provider about:  All medicines you are taking, including vitamins, herbs, eye drops, creams, and over-the-counter medicines.  Any blood disorders you  have.  Any surgeries you have had.  Any medical conditions you have.  Whether you are pregnant or may be pregnant. How are the results reported? Your results will be reported as a percentage that indicates how much of your hemoglobin has glucose attached to it (is glycated). Your health care provider will compare your results to normal ranges that were established after testing a large group of people (reference ranges). Reference ranges may vary among labs and hospitals. For this test, common reference ranges are:  Adult or child without diabetes: 4-5.6%.  Adult or child with diabetes and good blood glucose control: less than 7%. What do the results mean? If you have diabetes:  A result of less than 7% is considered normal, meaning that your blood glucose is well controlled.  A result higher than 7% means that your blood glucose is not well controlled, and your treatment plan may need to be adjusted. If you do not have diabetes:  A result within the reference range is considered normal, meaning that you are not at high risk for diabetes.  A result of 5.7-6.4% means that you have a high risk of developing diabetes, and you may have prediabetes. Prediabetes is the condition of having a blood glucose level that is higher than it should be, but not high enough for you to be diagnosed with diabetes. Having prediabetes puts you at risk for developing type 2 diabetes (type 2 diabetes mellitus). You may have more tests, including a repeat HbA1c test.  Results of 6.5% or higher on two separate HbA1c tests mean that you have diabetes. You may have more tests to confirm the diagnosis. Abnormally low HbA1c values may be caused by:  Pregnancy.  Severe blood loss.  Receiving donated blood (transfusions).  Low red blood cell count (anemia).  Long-term kidney failure.  Some unusual forms (variants) of hemoglobin. Talk with your health care provider about what your results mean. Questions to  ask your health care provider Ask your health care provider, or the department that is doing the test:  When will my results be ready?  How will I get my results?  What are my treatment options?  What other tests do I need?  What are my next steps? Summary  The hemoglobin A1c test (HbA1c test) may be done to evaluate your risk for developing diabetes, to diagnose diabetes, and to monitor long-term control of blood sugar (glucose) in people who have diabetes and help make treatment decisions.  Hemoglobin is a type of protein in the blood that carries oxygen. Glucose attaches to hemoglobin to form glycated hemoglobin. This test checks the amount of glycated hemoglobin in your blood, which is a good indicator of the average amount of glucose in your blood during the past 2-3 months.  Talk with your health care provider about what your results mean. This information is not intended to replace advice given to you by your health care provider. Make sure you discuss any questions you have with your health care provider. Document Revised: 04/09/2017 Document Reviewed: 12/08/2016 Elsevier Patient Education  [place].  Hyperglycemia Hyperglycemia occurs when the level of sugar (glucose) in the blood is too high. Glucose is a type of sugar that provides the body's main source of energy. Certain hormones (insulin and glucagon) control the level of glucose in the blood. Insulin lowers blood glucose, and glucagon increases blood glucose. Hyperglycemia can result from having too little insulin in the bloodstream, or from the body not responding normally to insulin. Hyperglycemia occurs most often in people who have diabetes (diabetes mellitus), but it can happen in people who do not have diabetes. It can develop quickly, and it can be life-threatening if it causes you to become severely dehydrated (diabetic ketoacidosis or hyperglycemic hyperosmolar state). Severe hyperglycemia is a medical  emergency. What are the causes? If you have diabetes, hyperglycemia may be caused by:  Diabetes medicine.  Medicines that increase blood glucose or affect your diabetes control.  Not eating enough, or not eating often enough.  Changes in physical activity level.  Being sick or having an infection. If you have prediabetes or undiagnosed diabetes:  Hyperglycemia may be caused by those conditions. If you do not have diabetes, hyperglycemia may be caused by:  Certain medicines, including steroid medicines, beta-blockers, epinephrine, and thiazide diuretics.  Stress.  Serious illness.  Surgery.  Diseases of the pancreas.  Infection. What increases the risk? Hyperglycemia is more likely to develop in people who have risk factors for diabetes, such as:  Having a family member with diabetes.  Having a gene for type 1 diabetes that is passed from parent to child (inherited).  Living in an area with cold weather conditions.  Exposure to certain viruses.  Certain conditions in which the body's disease-fighting (immune) system attacks itself (autoimmune disorders).  Being overweight or obese.  Having an inactive (sedentary) lifestyle.  Having been diagnosed with insulin resistance.  Having a history of prediabetes, gestational diabetes, or polycystic ovarian syndrome (PCOS).  Being of American-Indian, African-American, Hispanic/Latino, or Asian/Pacific Islander descent. What are the signs or symptoms? Hyperglycemia may not cause any symptoms. If you do have symptoms, they may include early warning signs, such as:  Increased thirst.  Hunger.  Feeling very tired.  Needing to  urinate more often than usual.  Blurry vision. Other symptoms may develop if hyperglycemia gets worse, such as:  Dry mouth.  Loss of appetite.  Fruity-smelling breath.  Weakness.  Unexpected or rapid weight gain or weight loss.  Tingling or numbness in the hands or  feet.  Headache.  Skin that does not quickly return to normal after being lightly pinched and released (poor skin turgor).  Abdominal pain.  Cuts or bruises that are slow to heal. How is this diagnosed? Hyperglycemia is diagnosed with a blood test to measure your blood glucose level. This blood test is usually done while you are having symptoms. Your health care provider may also do a physical exam and review your medical history. You may have more tests to determine the cause of your hyperglycemia, such as:  A fasting blood glucose (FBG) test. You will not be allowed to eat (you will fast) for at least 8 hours before a blood sample is taken.  An A1c (hemoglobin A1c) blood test. This provides information about blood glucose control over the previous 2-3 months.  An oral glucose tolerance test (OGTT). This measures your blood glucose at two times: ? After fasting. This is your baseline blood glucose level. ? Two hours after drinking a beverage that contains glucose. How is this treated? Treatment depends on the cause of your hyperglycemia. Treatment may include:  Taking medicine to regulate your blood glucose levels. If you take insulin or other diabetes medicines, your medicine or dosage may be adjusted.  Lifestyle changes, such as exercising more, eating healthier foods, or losing weight.  Treating an illness or infection, if this caused your hyperglycemia.  Checking your blood glucose more often.  Stopping or reducing steroid medicines, if these caused your hyperglycemia. If your hyperglycemia becomes severe and it results in hyperglycemic hyperosmolar state, you must be hospitalized and given IV fluids. Follow these instructions at home:  General instructions  Take over-the-counter and prescription medicines only as told by your health care provider.  Do not use any products that contain nicotine or tobacco, such as cigarettes and e-cigarettes. If you need help quitting, ask  your health care provider.  Limit alcohol intake to no more than 1 drink per day for nonpregnant women and 2 drinks per day for men. One drink equals 12 oz of beer, 5 oz of wine, or 1 oz of hard liquor.  Learn to manage stress. If you need help with this, ask your health care provider.  Keep all follow-up visits as told by your health care provider. This is important. Eating and drinking   Maintain a healthy weight.  Exercise regularly, as directed by your health care provider.  Stay hydrated, especially when you exercise, get sick, or spend time in hot temperatures.  Eat healthy foods, such as: ? Lean proteins. ? Complex carbohydrates. ? Fresh fruits and vegetables. ? Low-fat dairy products. ? Healthy fats.  Drink enough fluid to keep your urine clear or pale yellow. If you have diabetes:  Make sure you know the symptoms of hyperglycemia.  Follow your diabetes management plan, as told by your health care provider. Make sure you: ? Take your insulin and medicines as directed. ? Follow your exercise plan. ? Follow your meal plan. Eat on time, and do not skip meals. ? Check your blood glucose as often as directed. Make sure to check your blood glucose before and after exercise. If you exercise longer or in a different way than usual, check your blood glucose  more often. ? Follow your sick day plan whenever you cannot eat or drink normally. Make this plan in advance with your health care provider.  Share your diabetes management plan with people in your workplace, school, and household.  Check your urine for ketones when you are ill and as told by your health care provider.  Carry a medical alert card or wear medical alert jewelry. Contact a health care provider if:  Your blood glucose is at or above 240 mg/dL (13.3 mmol/L) for 2 days in a row.  You have problems keeping your blood glucose in your target range.  You have frequent episodes of hyperglycemia. Get help right  away if:  You have difficulty breathing.  You have a change in how you think, feel, or act (mental status).  You have nausea or vomiting that does not go away. These symptoms may represent a serious problem that is an emergency. Do not wait to see if the symptoms will go away. Get medical help right away. Call your local emergency services (911 in the [place] Do not drive yourself to the hospital. Summary  Hyperglycemia occurs when the level of sugar (glucose) in the blood is too high.  Hyperglycemia is diagnosed with a blood test to measure your blood glucose level. This blood test is usually done while you are having symptoms. Your health care provider may also do a physical exam and review your medical history.  If you have diabetes, follow your diabetes management plan as told by your health care provider.  Contact your health care provider if you have problems keeping your blood glucose in your target range. This information is not intended to replace advice given to you by your health care provider. Make sure you discuss any questions you have with your health care provider. Document Revised: 01/13/2016 Document Reviewed: 01/13/2016 Elsevier Patient Education  [place].  Hypoglycemia Hypoglycemia is when the sugar (glucose) level in your blood is too low. Signs of low blood sugar may include:  Feeling: ? Hungry. ? Worried or nervous (anxious). ? Sweaty and clammy. ? Confused. ? Dizzy. ? Sleepy. ? Sick to your stomach (nauseous).  Having: ? A fast heartbeat. ? A headache. ? A change in your vision. ? Tingling or no feeling (numbness) around your mouth, lips, or tongue. ? Jerky movements that you cannot control (seizure).  Having trouble with: ? Moving (coordination). ? Sleeping. ? Passing out (fainting). ? Getting upset easily (irritability). Low blood sugar can happen to people who have diabetes and people who do not have diabetes. Low blood sugar can  happen quickly, and it can be an emergency. Treating low blood sugar Low blood sugar is often treated by eating or drinking something sugary right away, such as:  Fruit juice, 4-6 oz (120-150 mL).  Regular soda (not diet soda), 4-6 oz (120-150 mL).  Low-fat milk, 4 oz (120 mL).  Several pieces of hard candy.  Sugar or honey, 1 Tbsp (15 mL). Treating low blood sugar if you have diabetes If you can think clearly and swallow safely, follow the 15:15 rule:  Take 15 grams of a fast-acting carb (carbohydrate). Talk with your doctor about how much you should take.  Always keep a source of fast-acting carb with you, such as: ? Sugar tablets (glucose pills). Take 3-4 pills. ? 6-8 pieces of hard candy. ? 4-6 oz (120-150 mL) of fruit juice. ? 4-6 oz (120-150 mL) of regular (not diet) soda. ? 1 Tbsp (15 mL) honey  or sugar.  Check your blood sugar 15 minutes after you take the carb.  If your blood sugar is still at or below 70 mg/dL (3.9 mmol/L), take 15 grams of a carb again.  If your blood sugar does not go above 70 mg/dL (3.9 mmol/L) after 3 tries, get help right away.  After your blood sugar goes back to normal, eat a meal or a snack within 1 hour.  Treating very low blood sugar If your blood sugar is at or below 54 mg/dL (3 mmol/L), you have very low blood sugar (severe hypoglycemia). This may also cause:  Passing out.  Jerky movements you cannot control (seizure).  Losing consciousness (coma). This is an emergency. Do not wait to see if the symptoms will go away. Get medical help right away. Call your local emergency services (911 in the [place] Do not drive yourself to the hospital. If you have very low blood sugar and you cannot eat or drink, you may need a glucagon shot (injection). A family member or friend should learn how to check your blood sugar and how to give you a glucagon shot. Ask your doctor if you need to have a glucagon shot kit at home. Follow these instructions at  home: General instructions  Take over-the-counter and prescription medicines only as told by your doctor.  Stay aware of your blood sugar as told by your doctor.  Limit alcohol intake to no more than 1 drink a day for nonpregnant women and 2 drinks a day for men. One drink equals 12 oz of beer (355 mL), 5 oz of wine (148 mL), or 1 oz of hard liquor (44 mL).  Keep all follow-up visits as told by your doctor. This is important. If you have diabetes:   Follow your diabetes care plan as told by your doctor. Make sure you: ? Know the signs of low blood sugar. ? Take your medicines as told. ? Follow your exercise and meal plan. ? Eat on time. Do not skip meals. ? Check your blood sugar as often as told by your doctor. Always check it before and after exercise. ? Follow your sick day plan when you cannot eat or drink normally. Make this plan ahead of time with your doctor.  Share your diabetes care plan with: ? Your work or school. ? People you live with.  Check your pee (urine) for ketones: ? When you are sick. ? As told by your doctor.  Carry a card or wear jewelry that says you have diabetes. Contact a doctor if:  You have trouble keeping your blood sugar in your target range.  You have low blood sugar often. Get help right away if:  You still have symptoms after you eat or drink something sugary.  Your blood sugar is at or below 54 mg/dL (3 mmol/L).  You have jerky movements that you cannot control.  You pass out. These symptoms may be an emergency. Do not wait to see if the symptoms will go away. Get medical help right away. Call your local emergency services (911 in the [place] Do not drive yourself to the hospital. Summary  Hypoglycemia happens when the level of sugar (glucose) in your blood is too low.  Low blood sugar can happen to people who have diabetes and people who do not have diabetes. Low blood sugar can happen quickly, and it can be an emergency.  Make  sure you know the signs of low blood sugar and know how to treat  it.  Always keep a source of sugar (fast-acting carb) with you to treat low blood sugar. This information is not intended to replace advice given to you by your health care provider. Make sure you discuss any questions you have with your health care provider. Document Revised: 08/18/2018 Document Reviewed: 05/31/2015 Elsevier Patient Education  2020 [employer].

## 2020-02-09 NOTE — Progress Notes (Addendum)
Progress Note  Patient Name: Lisa Poole Date of Encounter: 02/09/2020  [facility] Cardiologist: Dr. Aundra Dubin  Subjective   A little emotional t his morning, though says she feels OK, no CP, palpitations or SOB, wants to go home  Inpatient Medications    Scheduled Meds:  amiodarone  200 mg Oral BID   butamben-tetracaine-benzocaine  1 spray Topical Once   carvedilol  3.125 mg Oral BID WC   Chlorhexidine Gluconate Cloth  6 each Topical Q0600   dapagliflozin propanediol  10 mg Oral Daily   digoxin  0.125 mg Oral Daily   folic acid  1 mg Oral Daily   gabapentin  600 mg Oral BID   influenza vac split quadrivalent PF  0.5 mL Intramuscular Tomorrow-1000   insulin aspart  0-15 Units Subcutaneous TID WC   insulin aspart  0-5 Units Subcutaneous QHS   insulin aspart  4 Units Subcutaneous TID WC   insulin detemir  7 Units Subcutaneous Daily   lidocaine  1 application Topical Once   methotrexate  12.5 mg Oral Weekly   mupirocin ointment  1 application Nasal BID   pantoprazole  80 mg Oral Daily   pneumococcal 23 valent vaccine  0.5 mL Intramuscular Tomorrow-1000   predniSONE  60 mg Oral Q breakfast   sacubitril-valsartan  1 tablet Oral BID   sodium chloride flush  3 mL Intravenous Q12H   sodium chloride flush  3 mL Intravenous Q12H   spironolactone  25 mg Oral Daily   sulfamethoxazole-trimethoprim  1 tablet Oral Once per day on Mon Wed Fri   Continuous Infusions:  sodium chloride     sodium chloride 10 mL/hr at 02/09/20 0555   heparin 900 Units/hr (02/09/20 0706)   PRN Meds: sodium chloride, albuterol, HYDROcodone-acetaminophen, phenylephrine, sodium chloride flush   Vital Signs    Vitals:   02/08/20 1622 02/08/20 2129 02/09/20 0050 02/09/20 0343  BP: 95/64 97/74 (!) 82/67 97/72  Pulse: 80 80 72 76  Resp: 16 15 14 18   Temp: 98.7 F (37.1 C) 98.1 F (36.7 C) 97.7 F (36.5 C) 98.2 F (36.8 C)  TempSrc: Oral Oral Oral Oral  SpO2: 95% 96% 98% 98%  Weight:   74.8 kg    Height:        Intake/Output Summary (Last 24 hours) at 02/09/2020 0711 Last data filed at 02/09/2020 0345 Gross per 24 hour  Intake 1483.32 ml  Output 2900 ml  Net -1416.68 ml   Last 3 Weights 02/09/2020 02/08/2020 02/07/2020  Weight (lbs) 165 lb 164 lb 9.6 oz 163 lb  Weight (kg) 74.844 kg 74.662 kg 73.936 kg      Telemetry    AV pacing, occ PVCs, rare NSVT 3-4 beats - Personally Reviewed  ECG    AV paced, 73bpm, PVCs, QTc looks OK - Personally Reviewed  Physical Exam   GEN: No acute distress.   Neck: No JVD Cardiac: RRR, no murmurs, rubs, or gallops.  Respiratory: CTA b/ly. GI: Soft, nontender, non-distended  MS: No edema; No deformity. Neuro:  Nonfocal  Psych: Normal affect   Labs    High Sensitivity Troponin:  No results for input(s): TROPONINIHS in the last 720 hours.    Chemistry Recent Labs  Lab 02/07/20 0704 02/08/20 0347 02/09/20 0457  NA 137 138 135  K 3.8 5.0 4.0  CL 101 103 100  CO2 26 23 25   GLUCOSE 259* 217* 211*  BUN 20 25* 21*  CREATININE 0.82 0.84 0.73  CALCIUM 8.7*  9.3 9.1  PROT 5.6*  --   --   ALBUMIN 2.9*  --   --   AST 14*  --   --   ALT 20  --   --   ALKPHOS 36*  --   --   BILITOT 0.8  --   --   GFRNONAA >60 >60 >60  GFRAA >60 >60 >60  ANIONGAP 10 12 10      Hematology Recent Labs  Lab 02/07/20 0704 02/07/20 1350 02/08/20 0347 02/09/20 0457  WBC 11.3*  --  12.8* 8.0  RBC 5.16*  --  4.95 5.11  HGB 14.0 15.0 13.8 14.0  HCT 45.1  --  44.2 44.7  MCV 87.4  --  89.3 87.5  MCH 27.1  --  27.9 27.4  MCHC 31.0  --  31.2 31.3  RDW 21.3*  --  21.7* 21.4*  PLT 196  --  231 210    BNP Recent Labs  Lab 02/06/20 0725  BNP 344.3*     DDimer No results for input(s): DDIMER in the last 168 hours.   Radiology    CT Chest High Resolution Result Date: 02/07/2020 CLINICAL DATA:  Sarcoidosis. EXAM: CT CHEST WITHOUT CONTRAST TECHNIQUE: Multidetector CT imaging of the chest was performed following the standard protocol without  intravenous contrast. High resolution imaging of the lungs, as well as inspiratory and expiratory imaging, was performed. COMPARISON:  12/20/2019 chest radiograph. 12/06/2019 high-resolution chest CT. FINDINGS: Cardiovascular: Normal heart size. No significant pericardial effusion/thickening. Three lead left subclavian ICD with lead tips in the right atrium and right ventricle. Left anterior descending coronary atherosclerosis. Atherosclerotic nonaneurysmal thoracic aorta. Normal caliber pulmonary arteries. Mediastinum/Nodes: No discrete thyroid nodules. Unremarkable esophagus. No pathologically enlarged noncalcified axillary, mediastinal or hilar lymph nodes, noting limited sensitivity for the detection of hilar adenopathy on this noncontrast study. Coarsely calcified small AP window and left hilar lymph nodes, unchanged. Lungs/Pleura: No pneumothorax. No pleural effusion. Subpleural 9 mm solid pulmonary nodule in the superior segment left lower lobe along the major fissure (series 6/image 50), new. Otherwise no acute consolidative airspace disease, lung masses or additional significant pulmonary nodules. Previously described patchy ground-glass opacities in the lower lungs have resolved. No significant regions of subpleural reticulation, ground-glass attenuation, traction bronchiectasis, architectural distortion or frank honeycombing. No significant air trapping or evidence of tracheobronchomalacia on the expiration sequence. Stable calcified subcentimeter left upper lobe granulomas. Upper abdomen: Stable calcified splenic and hepatic granulomas. Colonic diverticulosis. Musculoskeletal: No aggressive appearing focal osseous lesions. Mild thoracic spondylosis. IMPRESSION: 1. New 9 mm solid pulmonary nodule in the superior segment left lower lobe along the major fissure, more likely infectious/inflammatory given rapid appearance since recent 12/06/2019 chest CT study. Suggest attention on follow-up chest CT in 3  months. 2. No evidence of interstitial lung disease. Previously described patchy ground-glass opacities in the lower lungs have resolved. 3. Evidence of prior granulomatous disease with calcified left mediastinal and left hilar adenopathy and calcified granulomas in the left lung, liver and spleen. These findings are more likely due to remote granulomatous infection, although could be due to reported sarcoidosis. 4. One vessel coronary atherosclerosis. 5. Aortic Atherosclerosis (ICD10-I70.0). Electronically Signed   By: Ilona Sorrel M.D.   On: 02/07/2020 15:56    Cardiac Studies   02/07/2020: TTE IMPRESSIONS   1. Left ventricular ejection fraction, by estimation, is 35 to 40%. The  left ventricle has moderately decreased function. The left ventricle  demonstrates global hypokinesis. The left ventricular internal cavity size  was mildly dilated. Left ventricular  diastolic parameters are consistent with Grade I diastolic dysfunction  (impaired relaxation).   2. Right ventricular systolic function is normal. The right ventricular  size is normal. There is normal pulmonary artery systolic pressure.   3. The mitral valve is normal in structure. Mild mitral valve  regurgitation. No evidence of mitral stenosis.   4. The aortic valve is normal in structure. Aortic valve regurgitation is  mild to moderate. Mild to moderate aortic valve sclerosis/calcification is  present, without any evidence of aortic stenosis. Aortic regurgitation PHT  measures 456 msec. Aortic  valve mean gradient measures 9.0 mmHg. Aortic valve Vmax measures 1.93  m/s.   5. The inferior vena cava is normal in size with greater than 50%  respiratory variability, suggesting right atrial pressure of 3 mmHg.    01/26/20: PET FINAL COMMENTS   Cardiac PET with concurrent CT was performed.   Myocardial perfusion Rb-82 imaging demonstrates perfusion defects in 5 segments.   Metabolic imaging performed with FDG PET and demonstrates  mild to moderate abnormal uptake in the 5/17 segments   demonstrating perfusion abnormalities.   FDG PET/CT chest and upper abdomen: no evidence of metabolically active sarcoid in the lymph nodes, no metabolically active   pulmonary opacities. Calcified pulmonary nodules, hilar lymph nodes and calcifications in the spleen are consistent with   sequela of granulomatous disease.   Impression:   1. Findings in the myocardium are consistent with active inflammation.   2. No evidence of metabolically active extracardiac sarcoidosis, granulomatous disease in the chest and abdomen consistent   with treated granulomas.      12/01/2019: R/LHC 1. Elevated right and left heart filling pressures.  2. Low cardiac output on norepinephrine 3.  3. Mixed pulmonary venous/pulmonary arterial hypertension.  4. No significant coronary disease.  5. Successful IABP placement.      11/27/2019: TTE IMPRESSIONS  1. Left ventricular ejection fraction, by estimation, is 20 to 25%. The  left ventricle has severely decreased function. The left ventricle  demonstrates regional wall motion abnormalities (see scoring  diagram/findings for description). The left  ventricular internal cavity size was mildly dilated. Left ventricular  diastolic parameters are indeterminate.   2. Right ventricular systolic function is normal. The right ventricular  size is mildly enlarged. There is moderately elevated pulmonary artery  systolic pressure. The estimated right ventricular systolic pressure is  56.2 mmHg.   3. Left atrial size was mildly dilated.   4. Right atrial size was moderately dilated.   5. The mitral valve is normal in structure. Moderate mitral valve  regurgitation.   6. Tricuspid valve regurgitation is moderate to severe.   7. The aortic valve is grossly normal. Aortic valve regurgitation is  mild.   8. The inferior vena cava is normal in size with <50% respiratory  variability, suggesting right atrial  pressure of 8 mmHg.   Patient Profile     54 y.o. female with a hx of HTN, HLD, DM, CHB w/PPM > ICD, No obstructive CAD, subclinical breif AFib by device, developed NICM suspected sarcoid with cardiac sarcoid on methotrexate and prednisone outpatient,  R IJ DVT   Admitted with VT/ICD shocks   Recent events: 09/2018 - Echo EF 60-65%, mild LVH, RV normal. Mild- mod AI. No stenosis 02/2019 - developed complete heart block, s/p PPM 08/2019 - COVID 19 infection, did not require hospitalization 11/26/2019 - acute SOB, acute heart failure, tele showing episodes of VT, Amio for suppression 11/2019 -  Echo EF newly decreased to 20-25% with septal-lateral dyssynchrony and septal severe hypokinesis, normal RV, moderate TR, moderate MR 11/28/2019 - cardiogenic shock requiring pressors 12/01/2019 - IABP placement, LHC/RHC with no significant CAD, elevated filling pressures, and low cardiac output on norepinephrine 12/02/2019 - Torsades/VT x 3 with prolonged QT required DC-CV. Amio switched to lido.  12/04/2019 - IABP removed, hematoma right groin, Lidocaine stopped 12/05/2019 - CMR showed patchy LGE in the basal septum, basal-mid inferoseptum/inferior wall, basal anterior wall, mid anterolateral wall. Cardiac MRI LGE pattern could be consistent with cardiac sarcoidosis  12/19/2019 - admitted with prolonged QT and ventricular tachycardia. PPM changed to BiV ICD. 12/2019 - Right groin infection, MRSA on culture, treated with Linezolid, Rt IJ DVT (placed on Eliquis) 01/08/2020 - episode of shingles, given acyclovir and doxycycline Sep 2021 - Mexitil stopped due to nausea, Amio restarted   She saw Dr. Lovena Le 8/31 in the office, noted more VT, planned to stop mexiletine (w/nausea) and start amiodarone, device programmed VT one zone to deliver both burst and ramp.  A f/u note by nursing on 9/14 reports also stopped dig.  Assessment & Plan    1. VT     Recurrent and not new for her   Lengthy review of her device  interrogation noted true VT, though rate fairly slow 130's-150's (and asymptomatic) Unfortunately she was shocked for this slow VT, and her device reprogrammed yesterday 2 zone device VF >194bpm ATP before charge 25J, 35J x5 VT 128-194bpm ATP therapies only   EKG today reviewed, QT remains acceptable Transitioned to oral amiodarone today, 200mg  BID     2. NICM 3. Chronic CHF      PET showed active inflammation in the heart, Cardiac sarcoidosis: Strongly suspected      HF managing steroids and started oin Bactrim for prophylaxis      DM team also on board, not on insulin outpt, though will need going home   s/p bronch with bx yesterday RHC today     4. PAFib     H/o R IJ DVT     eliquis > heparin for procedures      Labs stable    5. DM     uncontrolled     SSI here     Coordinator on board as well appreciate the help, HF team addressed   Dr. Lovena Le has seen her this AM No new recommendations from EP standpoint QT looks OK Bactrim added to start today  OK for discharge from EP perspective We will follow from [place]    For questions or updates, please contact [place] Please consult [email or web address] for contact info under     Signed, Baldwin [place], PA-C  02/09/2020, 7:11 AM    EP Attending  Patient seen and examined. Agree with above. The patient is doing well. She is ready for DC home from EP perspective. She will followup with Dr. Quentin Ore. Please call for questions. She will go home on amio 200 bid.  Carleene Overlie Gianpaolo Mindel,MD

## 2020-02-09 NOTE — TOC Initial Note (Signed)
Transition of Care [facility]) - Initial/Assessment Note    Patient Details  Name: Lisa Poole MRN: [id number] Date of Birth: [birth year]  [facility]) CM/SW Contact:    Bethena Roys, RN Phone Number: 02/09/2020, 4:25 PM  Clinical Narrative: Risk for readmission assessment completed. Patient was from home prior to arrival and will return home today. Patient states she is independent. Patient gets to her appointments without problems and she can get medications without any issues. No further needs from Case Manager at this time.   Expected Discharge Plan: Home/Self Care Barriers to Discharge: No Barriers Identified  Patient Goals and CMS Choice Patient states their goals for this hospitalization and ongoing recovery are:: to return home   Choice offered to / list presented to : NA  Expected Discharge Plan and Services Expected Discharge Plan: Home/Self Care In-house Referral: NA Discharge Planning Services: CM Consult Post Acute Care Choice: NA Living arrangements for the past 2 months: Single Family Home Expected Discharge Date: 02/09/20               DME Arranged: N/A    HH Arranged: NA    Prior Living Arrangements/Services Living arrangements for the past 2 months: Single Family Home Lives with:: Spouse Patient language and need for interpreter reviewed:: Yes        Need for Family Participation in Patient Care: Yes (Comment) Care giver support system in place?: Yes (comment)   Criminal Activity/Legal Involvement Pertinent to Current Situation/Hospitalization: No - Comment as needed  Activities of Daily Living Home Assistive Devices/Equipment: Eyeglasses ADL Screening (condition at time of admission) Patient's cognitive ability adequate to safely complete daily activities?: Yes Is the patient deaf or have difficulty hearing?: No Does the patient have difficulty seeing, even when wearing glasses/contacts?: No Does the patient have difficulty  concentrating, remembering, or making decisions?: No Patient able to express need for assistance with ADLs?: Yes Does the patient have difficulty dressing or bathing?: No Independently performs ADLs?: Yes (appropriate for developmental age) Does the patient have difficulty walking or climbing stairs?: Yes (sometimes climbing stairs due to knee replacement ) Weakness of Legs: Left (due to knee replacement ) Weakness of Arms/Hands: None  Permission Sought/Granted Permission sought to share information with : Case Manager Permission granted to share information with : Yes, Verbal Permission Granted      Emotional Assessment Appearance:: Appears stated age Attitude/Demeanor/Rapport: Engaged Affect (typically observed): Appropriate Orientation: : Oriented to Self, Oriented to Place, Oriented to  Time, Oriented to Situation Alcohol / Substance Use: Not Applicable Psych Involvement: No (comment)  Admission diagnosis:  Ventricular tachycardia ([place]) [I47.2] Patient Active Problem List   Diagnosis Date Noted   Acute kidney injury (nontraumatic) ([place])    Defibrillator discharge    Acute left flank pain 01/11/2020   Thrush 01/11/2020   ICD (implantable cardioverter-defibrillator) in place 01/09/2020   Ventricular tachycardia ([place]) 12/19/2019   Wound infection after surgery 76/73/4193   Chronic systolic heart failure ([place])    Shortness of breath    Acute decompensated heart failure ([place]) 11/26/2019   Uterovaginal prolapse, incomplete 02/15/2019   Complete heart block ([place]) 02/11/2019   Cystocele without uterine prolapse 12/12/2018   Elevated troponin level    Chest discomfort    NSTEMI (non-ST elevated myocardial infarction) ([place]) 09/27/2018   Primary osteoarthritis of left knee 12/10/2017   S/P knee replacement 12/10/2017   Pain in left knee 10/20/2017   Tear of right rotator cuff 05/20/2017   Acromioclavicular arthrosis 08/01/2013  Aortic insufficiency     Dry eye syndrome 11/25/2007   Asthma 09/13/2006   Diabetes mellitus ([place]) 09/13/2006   HTN (hypertension) 09/13/2006   PCP:  Milford Cage, PA Pharmacy:   [facility] [place], [place] AT [address] [place] [address] Phone: [phone] Fax: [phone]  [facility] [place] [place] - [address] [place] [address] Phone: [phone] Fax: [phone]   Readmission Risk Interventions Readmission Risk Prevention Plan 02/09/2020  Transportation Screening Complete  Medication Review (RN Care Manager) Complete  PCP or Specialist appointment within 3-5 days of discharge Complete  HRI or [place] Complete  SW Recovery Care/Counseling Consult Complete  [place] Not Applicable  Some recent data might be hidden

## 2020-02-09 NOTE — H&P (View-Only) (Signed)
Patient ID: Lisa Poole, female   DOB: 25-May-1965, 54 y.o.   MRN: [id number]      Advanced Heart Failure Rounding Note  PCP-Cardiologist: No primary care provider on file.   Subjective:    02/08/20 S/P Bronch with Bx   Stable today, no dyspnea.  Wants to go home.  No further VT, now on po amiodarone.     Objective:   Weight Range: 74.8 kg Body mass index is 32.22 kg/m.   Vital Signs:   Temp:  [97.7 F (36.5 C)-98.7 F (37.1 C)] 98.1 F (36.7 C) (10/01 0724) Pulse Rate:  [70-83] 83 (10/01 0836) Resp:  [14-18] 17 (10/01 0724) BP: (82-114)/(62-74) 114/74 (10/01 0724) SpO2:  [95 %-98 %] 98 % (10/01 0724) Weight:  [74.8 kg] 74.8 kg (10/01 0050) Last BM Date: 02/09/20  Weight change: Filed Weights   02/07/20 0452 02/08/20 0619 02/09/20 0050  Weight: 73.9 kg 74.7 kg 74.8 kg    Intake/Output:   Intake/Output Summary (Last 24 hours) at 02/09/2020 1032 Last data filed at 02/09/2020 0750 Gross per 24 hour  Intake 1083.32 ml  Output 2900 ml  Net -1816.68 ml      Physical Exam    General: NAD Neck: No JVD, no thyromegaly or thyroid nodule.  Lungs: Clear to auscultation bilaterally with normal respiratory effort. CV: Nondisplaced PMI.  Heart regular S1/S2, no S3/S4, no murmur.  No peripheral edema.   Abdomen: Soft, nontender, no hepatosplenomegaly, no distention.  Skin: Intact without lesions or rashes.  Neurologic: Alert and oriented x 3.  Psych: Normal affect. Extremities: No clubbing or cyanosis.  HEENT: Normal.    Telemetry   NSR, BiV paced no VT    Labs    CBC Recent Labs    02/08/20 0347 02/09/20 0457  WBC 12.8* 8.0  HGB 13.8 14.0  HCT 44.2 44.7  MCV 89.3 87.5  PLT 231 952   Basic Metabolic Panel Recent Labs    02/07/20 0704 02/07/20 1350 02/08/20 0347 02/09/20 0457  NA   < >  --  138 135  K   < >  --  5.0 4.0  CL   < >  --  103 100  CO2   < >  --  23 25  GLUCOSE   < >  --  217* 211*  BUN   < >  --  25* 21*  CREATININE   < >  --  0.84  0.73  CALCIUM   < >  --  9.3 9.1  MG  --  2.3  --   --    < > = values in this interval not displayed.   Liver Function Tests Recent Labs    02/07/20 0704  AST 14*  ALT 20  ALKPHOS 36*  BILITOT 0.8  PROT 5.6*  ALBUMIN 2.9*   No results for input(s): LIPASE, AMYLASE in the last 72 hours. Cardiac Enzymes No results for input(s): CKTOTAL, CKMB, CKMBINDEX, TROPONINI in the last 72 hours.  BNP: BNP (last 3 results) Recent Labs    11/26/19 2011 02/06/20 0725  BNP 1,258.1* 344.3*    ProBNP (last 3 results) No results for input(s): PROBNP in the last 8760 hours.   D-Dimer No results for input(s): DDIMER in the last 72 hours. Hemoglobin A1C No results for input(s): HGBA1C in the last 72 hours. Fasting Lipid Panel No results for input(s): CHOL, HDL, LDLCALC, TRIG, CHOLHDL, LDLDIRECT in the last 72 hours. Thyroid Function Tests No results for input(s):  TSH, T4TOTAL, T3FREE, THYROIDAB in the last 72 hours.  Invalid input(s): FREET3  Other results:   Imaging    No results found.   Medications:     Scheduled Medications: . amiodarone  200 mg Oral BID  . butamben-tetracaine-benzocaine  1 spray Topical Once  . carvedilol  3.125 mg Oral BID WC  . Chlorhexidine Gluconate Cloth  6 each Topical Q0600  . dapagliflozin propanediol  10 mg Oral Daily  . digoxin  0.125 mg Oral Daily  . folic acid  1 mg Oral Daily  . gabapentin  600 mg Oral BID  . influenza vac split quadrivalent PF  0.5 mL Intramuscular Tomorrow-1000  . insulin aspart  0-15 Units Subcutaneous TID WC  . insulin aspart  0-5 Units Subcutaneous QHS  . insulin aspart  4 Units Subcutaneous TID WC  . insulin detemir  7 Units Subcutaneous Daily  . lidocaine  1 application Topical Once  . methotrexate  12.5 mg Oral Weekly  . mupirocin ointment  1 application Nasal BID  . pantoprazole  80 mg Oral Daily  . pneumococcal 23 valent vaccine  0.5 mL Intramuscular Tomorrow-1000  . predniSONE  60 mg Oral Q breakfast    . sacubitril-valsartan  1 tablet Oral BID  . sodium chloride flush  3 mL Intravenous Q12H  . sodium chloride flush  3 mL Intravenous Q12H  . spironolactone  25 mg Oral Daily  . sulfamethoxazole-trimethoprim  1 tablet Oral Once per day on Mon Wed Fri    Infusions: . sodium chloride    . sodium chloride 10 mL/hr at 02/09/20 0555  . heparin 900 Units/hr (02/09/20 0706)    PRN Medications: sodium chloride, albuterol, HYDROcodone-acetaminophen, phenylephrine, sodium chloride flush     Assessment/Plan   1.Chronicsystolic CHF: Nonischemic cardiomyopathy. Echoin 7/21with EF newly decreased to 20-25% with septal-lateral dyssynchrony and septal severe hypokinesis, normal RV, moderate TR, moderate MR. Echo in 5/20 prior to PPM placement showed EF 60-65%. She developed shock in 7/21 requiring pressors/inotropes and IABP.LHC/RHC 7/23/21with no significant CAD, elevated filling pressures, and low cardiac output. cMRI 7/27/21showed patchy LGE in the basal septum, basal-mid inferoseptum/inferior wall, basal anterior wall, mid anterolateral wall. Cardiac MRI LGE pattern could be consistent with cardiac sarcoidosis (would explain earlier CHB). Alternatively, prior viral myocarditis (recent COVID-19). CT chest with ground glass lower lobes but no evidence for pulmonary sarcoidosis. She is now s/p device upgrade to MDT CRT-D.Cardiac PET in 8/21 was showed active inflammation in the heart, concerning again for cardiac sarcoidosis. She is back in with VT. She denies dyspnea but does note exertional fatigue. She is not volume overloaded on exam.  -Continue digoxin 0.125, think mexiletine was the culprit for nausea. - Continue spironolactone 25 mg daily - Decreased Entresto to 24/26 bid with soft BP (give as long as SBP > 85).  - Decreased Coreg to 3.125 mg bid with soft BP. -Continuedapagliflozin 10 mg daily. - RHC today to reassess cardiac output (given fatigue, concern for possible  low output HF).  We discussed risks/benefits of procedure and she agrees.  2. Cardiac sarcoidosis: Strongly suspected. Cardiac MRI and cardiac PET both suggestive of cardiac sarcoidosis, and unexplained CHB also concerning, as is VT. She had more VT after decreasing prednisone from 60 mg daily to 40 mg daily.  - She had initial pulse of 1 g Solumedrol x 1 (had 3 days of Solumedrol last admission) and then prednisone 60 mg daily. She will be on prednisone long-term, will need prophylaxis with Bactrim 3  days/week for PJP PNA.   - Continue methotrexate once a week, increased to 12.5 mg qwk. Will need to follow CBC, LFTs. Increase by 2.5 mg q2 wks to goal 20-25 mg.  - Cardiac PET reports granulomatous disease in chest and abdomen though no active inflammation => pulmonary following, had transbronchial bx to try to solidify diagnosis, pending path report. However, even if negative would continue treatment as possible to have isolated cardiac sarcoidosis and clinical course as well as imaging very suggestive of cardiac sarcoidosis.  3.Recurrent ventricular arrhythmias:She has hadVT with multiple morphologies. Increased ventricular arrhythmias over 6 months. No coronary disease on recent cath. Suspect as abovethat she has cardiac sarcoidosis (would also explain CHB). Amiodarone stopped in the past due to torsades and very long QTc on amiodarone. Recurrent ventricular arrhythmias now twice after tapering prednisone. -EP following, re-trying her on amidoarone => now on po amiodarone. Paced QTc is down to 436 today.  - Continue Coreg.  -Looks like she will need very slow prednisone taper with addition of MTX.  4. H/o ELF:YBOFBPZ due to cardiac sarcoidosis. Now s/p CRT.  5. Rt IJ DVT: likely from prior central line.  - Will needanticoagulationfor 3 months=>Eliquis (currently on hold for transbronchial bx and cath), cover with heparin gtt.  6. Type 2 diabetes: Glucose running high on steroids.  She has long-standing diabetes.  - Diabetes coordinator following and will adjust meds => will need insulin at home.  - She will benefit from endocrinology as outpatient, she will be on prednisone for at least a year.  Potentially home today if RHC stable.   Loralie Champagne 02/09/2020 10:32 AM

## 2020-02-09 NOTE — Progress Notes (Addendum)
Patient ID: Lisa Poole, female   DOB: 10-06-1965, 54 y.o.   MRN: [id number]      Advanced Heart Failure Rounding Note  PCP-Cardiologist: No primary care provider on file.   Subjective:    02/08/20 S/P Bronch with Bx   Stable today, no dyspnea.  Wants to go home.  No further VT, now on po amiodarone.     Objective:   Weight Range: 74.8 kg Body mass index is 32.22 kg/m.   Vital Signs:   Temp:  [97.7 F (36.5 C)-98.7 F (37.1 C)] 98.1 F (36.7 C) (10/01 0724) Pulse Rate:  [70-83] 83 (10/01 0836) Resp:  [14-18] 17 (10/01 0724) BP: (82-114)/(62-74) 114/74 (10/01 0724) SpO2:  [95 %-98 %] 98 % (10/01 0724) Weight:  [74.8 kg] 74.8 kg (10/01 0050) Last BM Date: 02/09/20  Weight change: Filed Weights   02/07/20 0452 02/08/20 0619 02/09/20 0050  Weight: 73.9 kg 74.7 kg 74.8 kg    Intake/Output:   Intake/Output Summary (Last 24 hours) at 02/09/2020 1032 Last data filed at 02/09/2020 0750 Gross per 24 hour  Intake 1083.32 ml  Output 2900 ml  Net -1816.68 ml      Physical Exam    General: NAD Neck: No JVD, no thyromegaly or thyroid nodule.  Lungs: Clear to auscultation bilaterally with normal respiratory effort. CV: Nondisplaced PMI.  Heart regular S1/S2, no S3/S4, no murmur.  No peripheral edema.   Abdomen: Soft, nontender, no hepatosplenomegaly, no distention.  Skin: Intact without lesions or rashes.  Neurologic: Alert and oriented x 3.  Psych: Normal affect. Extremities: No clubbing or cyanosis.  HEENT: Normal.    Telemetry   NSR, BiV paced no VT    Labs    CBC Recent Labs    02/08/20 0347 02/09/20 0457  WBC 12.8* 8.0  HGB 13.8 14.0  HCT 44.2 44.7  MCV 89.3 87.5  PLT 231 956   Basic Metabolic Panel Recent Labs    02/07/20 0704 02/07/20 1350 02/08/20 0347 02/09/20 0457  NA   < >  --  138 135  K   < >  --  5.0 4.0  CL   < >  --  103 100  CO2   < >  --  23 25  GLUCOSE   < >  --  217* 211*  BUN   < >  --  25* 21*  CREATININE   < >  --  0.84  0.73  CALCIUM   < >  --  9.3 9.1  MG  --  2.3  --   --    < > = values in this interval not displayed.   Liver Function Tests Recent Labs    02/07/20 0704  AST 14*  ALT 20  ALKPHOS 36*  BILITOT 0.8  PROT 5.6*  ALBUMIN 2.9*   No results for input(s): LIPASE, AMYLASE in the last 72 hours. Cardiac Enzymes No results for input(s): CKTOTAL, CKMB, CKMBINDEX, TROPONINI in the last 72 hours.  BNP: BNP (last 3 results) Recent Labs    11/26/19 2011 02/06/20 0725  BNP 1,258.1* 344.3*    ProBNP (last 3 results) No results for input(s): PROBNP in the last 8760 hours.   D-Dimer No results for input(s): DDIMER in the last 72 hours. Hemoglobin A1C No results for input(s): HGBA1C in the last 72 hours. Fasting Lipid Panel No results for input(s): CHOL, HDL, LDLCALC, TRIG, CHOLHDL, LDLDIRECT in the last 72 hours. Thyroid Function Tests No results for input(s):  TSH, T4TOTAL, T3FREE, THYROIDAB in the last 72 hours.  Invalid input(s): FREET3  Other results:   Imaging    No results found.   Medications:     Scheduled Medications: . amiodarone  200 mg Oral BID  . butamben-tetracaine-benzocaine  1 spray Topical Once  . carvedilol  3.125 mg Oral BID WC  . Chlorhexidine Gluconate Cloth  6 each Topical Q0600  . dapagliflozin propanediol  10 mg Oral Daily  . digoxin  0.125 mg Oral Daily  . folic acid  1 mg Oral Daily  . gabapentin  600 mg Oral BID  . influenza vac split quadrivalent PF  0.5 mL Intramuscular Tomorrow-1000  . insulin aspart  0-15 Units Subcutaneous TID WC  . insulin aspart  0-5 Units Subcutaneous QHS  . insulin aspart  4 Units Subcutaneous TID WC  . insulin detemir  7 Units Subcutaneous Daily  . lidocaine  1 application Topical Once  . methotrexate  12.5 mg Oral Weekly  . mupirocin ointment  1 application Nasal BID  . pantoprazole  80 mg Oral Daily  . pneumococcal 23 valent vaccine  0.5 mL Intramuscular Tomorrow-1000  . predniSONE  60 mg Oral Q breakfast    . sacubitril-valsartan  1 tablet Oral BID  . sodium chloride flush  3 mL Intravenous Q12H  . sodium chloride flush  3 mL Intravenous Q12H  . spironolactone  25 mg Oral Daily  . sulfamethoxazole-trimethoprim  1 tablet Oral Once per day on Mon Wed Fri    Infusions: . sodium chloride    . sodium chloride 10 mL/hr at 02/09/20 0555  . heparin 900 Units/hr (02/09/20 0706)    PRN Medications: sodium chloride, albuterol, HYDROcodone-acetaminophen, phenylephrine, sodium chloride flush     Assessment/Plan   1.Chronicsystolic CHF: Nonischemic cardiomyopathy. Echoin 7/21with EF newly decreased to 20-25% with septal-lateral dyssynchrony and septal severe hypokinesis, normal RV, moderate TR, moderate MR. Echo in 5/20 prior to PPM placement showed EF 60-65%. She developed shock in 7/21 requiring pressors/inotropes and IABP.LHC/RHC 7/23/21with no significant CAD, elevated filling pressures, and low cardiac output. cMRI 7/27/21showed patchy LGE in the basal septum, basal-mid inferoseptum/inferior wall, basal anterior wall, mid anterolateral wall. Cardiac MRI LGE pattern could be consistent with cardiac sarcoidosis (would explain earlier CHB). Alternatively, prior viral myocarditis (recent COVID-19). CT chest with ground glass lower lobes but no evidence for pulmonary sarcoidosis. She is now s/p device upgrade to MDT CRT-D.Cardiac PET in 8/21 was showed active inflammation in the heart, concerning again for cardiac sarcoidosis. She is back in with VT. She denies dyspnea but does note exertional fatigue. She is not volume overloaded on exam.  -Continue digoxin 0.125, think mexiletine was the culprit for nausea. - Continue spironolactone 25 mg daily - Decreased Entresto to 24/26 bid with soft BP (give as long as SBP > 85).  - Decreased Coreg to 3.125 mg bid with soft BP. -Continuedapagliflozin 10 mg daily. - RHC today to reassess cardiac output (given fatigue, concern for possible  low output HF).  We discussed risks/benefits of procedure and she agrees.  2. Cardiac sarcoidosis: Strongly suspected. Cardiac MRI and cardiac PET both suggestive of cardiac sarcoidosis, and unexplained CHB also concerning, as is VT. She had more VT after decreasing prednisone from 60 mg daily to 40 mg daily.  - She had initial pulse of 1 g Solumedrol x 1 (had 3 days of Solumedrol last admission) and then prednisone 60 mg daily. She will be on prednisone long-term, will need prophylaxis with Bactrim 3  days/week for PJP PNA.  Will send home on prednisone 60, will try to wean slowly after a month or so once she's on higher dose of methotrexate.  - Continue methotrexate once a week, increased to 12.5 mg qwk. Will need to follow CBC, LFTs monthly. Increase by 2.5 mg q2 wks to goal 20-25 mg.  - Cardiac PET reports granulomatous disease in chest and abdomen though no active inflammation => pulmonary following, had transbronchial bx to try to solidify diagnosis, pending path report. However, even if negative would continue treatment as possible to have isolated cardiac sarcoidosis and clinical course as well as imaging very suggestive of cardiac sarcoidosis.  3.Recurrent ventricular arrhythmias:She has hadVT with multiple morphologies. Increased ventricular arrhythmias over 6 months. No coronary disease on recent cath. Suspect as abovethat she has cardiac sarcoidosis (would also explain CHB). Amiodarone stopped in the past due to torsades and very long QTc on amiodarone. Recurrent ventricular arrhythmias now twice after tapering prednisone. -EP following, re-trying her on amidoarone => now on po amiodarone. Paced QTc is down to 436 today.  - Continue Coreg.  -Looks like she will need very slow prednisone taper with addition of MTX.  4. H/o HTD:SKAJGOT due to cardiac sarcoidosis. Now s/p CRT.  5. Rt IJ DVT: likely from prior central line.  - Will needanticoagulationfor 3 months=>Eliquis  (currently on hold for transbronchial bx and cath), cover with heparin gtt.  - Restart Eliquis after cath.  6. Type 2 diabetes: Glucose running high on steroids. She has long-standing diabetes.  - Diabetes coordinator following and will adjust meds => will need insulin at home.  - She will benefit from endocrinology as outpatient, she will be on prednisone for at least a year.  Potentially home today if RHC stable.   Loralie Champagne 02/09/2020 10:32 AM

## 2020-02-09 NOTE — Interval H&P Note (Signed)
History and Physical Interval Note:  02/09/2020 12:34 PM  Lisa Poole  has presented today for surgery, with the diagnosis of heart failure.  The various methods of treatment have been discussed with the patient and family. After consideration of risks, benefits and other options for treatment, the patient has consented to  Procedure(s): RIGHT HEART CATH (N/A) as a surgical intervention.  The patient's history has been reviewed, patient examined, no change in status, stable for surgery.  I have reviewed the patient's chart and labs.  Questions were answered to the patient's satisfaction.     Zaliah Wissner [employer]

## 2020-02-09 NOTE — TOC Benefit Eligibility Note (Signed)
Transition of Care [facility]) Benefit Eligibility Note    Patient Details  Name: Lisa Poole MRN: [id number] Date of Birth: 1966-04-04   Medication/Dose: DTEMIR ( LEVEMIR  7 UNITS DAILY  Covered?: Yes     Prescription Coverage Preferred Pharmacy: [facility]  and  [facility]  Spoke with Person/Company/Phone Number:: KEN @  [employer] # [phone]  Co-Pay: 25 % OF TOTAL COST     Deductible:  (IN COVERAGE GAP and LOWE INCOME SUBSIDY LEVEL -2)  Additional Notes: SECONDARY INS : MEDICAID [place]  : EFF-DATE 05-11-2017   CO-PAY- $ 4.00 FOR EACH PRESCRIPTION    Memory Argue Phone Number: 02/09/2020, 1:38 PM

## 2020-02-09 NOTE — Care Management (Signed)
02-09-20 1310 Benefits check submitted for [employer]. Case Manager will follow for cost.  Graves-Bigelow, Ocie Cornfield, RN,BSN Case Manager

## 2020-02-11 ENCOUNTER — Encounter (HOSPITAL_COMMUNITY): Payer: Self-pay | Admitting: Pulmonary Disease

## 2020-02-11 LAB — CULTURE, RESPIRATORY W GRAM STAIN: Culture: NORMAL

## 2020-02-12 ENCOUNTER — Telehealth: Payer: Self-pay

## 2020-02-12 ENCOUNTER — Encounter (HOSPITAL_COMMUNITY): Payer: Medicare Other | Admitting: Cardiology

## 2020-02-12 LAB — ACID FAST SMEAR (AFB, MYCOBACTERIA): Acid Fast Smear: NEGATIVE

## 2020-02-12 NOTE — Telephone Encounter (Signed)
CareAlert for "all therapies in a zone exhausted" on 02/10/20, episode # 3017 which is still in progress per episode log and unable to review. Since recent DC from hospital (s/p shocks, Amiodarone added), she has had 5 episodes VT with rates 150-160's requiring ATP (up to 10 sequences) with current presenting rhythm still at 133bpm/425ms and declared as VT (atrial rate avg 41ms) and all ATP therapies exhausted. 310 NSVT episodes since 02/06/20. No shocks.   Spoke with pt, she denies any cardiac symptoms today, reportedly feels well.  She does report that she had lightheadedness on Saturday afternoon but at the time attributed to elevated BS level (>300).  She denies any chest pain or SOB.  Assisted pt with sending a new transmission today.  New transmission shows episode in question lasted a total of 24 monuites and terminated after 10 rounds of ATP.  Since that time pt has had several more treated and NSVT episodes.  Confirmed pt compliance with medications including Carvedilol 3.125mg  BID, Amiodarone 200mg  BID and Digoxin 0.125mg  daily.   Scheduled pt for office visit with Dr. Quentin Ore on 02/13/20.    Educated pt on ED precautions anf driving restrictions.

## 2020-02-13 ENCOUNTER — Encounter: Payer: Self-pay | Admitting: Cardiology

## 2020-02-13 ENCOUNTER — Ambulatory Visit (INDEPENDENT_AMBULATORY_CARE_PROVIDER_SITE_OTHER): Payer: Medicare Other | Admitting: Cardiology

## 2020-02-13 ENCOUNTER — Other Ambulatory Visit: Payer: Self-pay

## 2020-02-13 VITALS — BP 110/76 | HR 91 | Ht 60.0 in | Wt 160.0 lb

## 2020-02-13 DIAGNOSIS — D8685 Sarcoid myocarditis: Secondary | ICD-10-CM

## 2020-02-13 DIAGNOSIS — I442 Atrioventricular block, complete: Secondary | ICD-10-CM

## 2020-02-13 DIAGNOSIS — I472 Ventricular tachycardia, unspecified: Secondary | ICD-10-CM

## 2020-02-13 DIAGNOSIS — Z95810 Presence of automatic (implantable) cardiac defibrillator: Secondary | ICD-10-CM | POA: Diagnosis not present

## 2020-02-13 DIAGNOSIS — E1169 Type 2 diabetes mellitus with other specified complication: Secondary | ICD-10-CM

## 2020-02-13 DIAGNOSIS — Z794 Long term (current) use of insulin: Secondary | ICD-10-CM

## 2020-02-13 NOTE — Progress Notes (Signed)
Electrophysiology Office Follow up Visit Note:    Date:  02/13/2020   ID:  Lisa Poole, DOB 1965-05-26, MRN [id number]  PCP:  Milford Cage, PA  [facility] Cardiologist:  No primary care provider on file.  [facility] Electrophysiologist:  None    Interval History:    Lisa Poole is a 54 y.o. female who presents for a follow up visit.   Since discharge she has received her PET study at [facility] which was consistent with active inflammation but did not show evidence of metabolically active extracardiac sarcoidosis.  She has been doing well since discharge on the prednisone 60 mg daily and her methotrexate.  She is on Bactrim and folic acid.  She has had some trouble with hyperglycemia while on the prednisone.  Her primary care is working with her on adjusting her sliding scale insulin and she has referral placed for endocrinology.  Overall the burden of her ventricular tachycardia is significantly decreased on the current regimen although she is requiring some ATP therapy.  Most ATP schemes have been successful between 1 and 5 schemes although there was one episode of slow ventricular tachycardia that exhausted 10 ATP schemes before ultimately terminating by itself.  She is not received any ICD shocks.    Past Medical History:  Diagnosis Date  . Aortic insufficiency   . Asthma 09/13/2006  . Complete heart block ([place]) 02/11/2019  . Cystocele without uterine prolapse 12/12/2018  . Diabetes mellitus without complication ([place])   . Elevated troponin level   . Hypercholesteremia   . Hypertension   . Primary osteoarthritis of left knee 12/10/2017    Past Surgical History:  Procedure Laterality Date  . APPENDECTOMY    . BIOPSY  02/08/2020   Procedure: BIOPSY;  Surgeon: Juanito Doom, MD;  Location: [facility] ENDOSCOPY;  Service: Cardiopulmonary;;  . BIV ICD INSERTION CRT-D N/A 12/07/2019   Procedure: BIV ICD INSERTION CRT-D;  Surgeon: Evans Lance, MD;  Location: [place] CV  LAB;  Service: Cardiovascular;  Laterality: N/A;  . BRONCHIAL WASHINGS  02/08/2020   Procedure: BRONCHIAL WASHINGS;  Surgeon: Juanito Doom, MD;  Location: [facility] ENDOSCOPY;  Service: Cardiopulmonary;;  . IABP INSERTION N/A 12/01/2019   Procedure: IABP Insertion;  Surgeon: Larey Dresser, MD;  Location: [place] CV LAB;  Service: Cardiovascular;  Laterality: N/A;  . LEFT HEART CATH AND CORONARY ANGIOGRAPHY N/A 09/28/2018   Procedure: LEFT HEART CATH AND CORONARY ANGIOGRAPHY;  Surgeon: Troy Sine, MD;  Location: [place] CV LAB;  Service: Cardiovascular;  Laterality: N/A;  . PACEMAKER IMPLANT N/A 02/13/2019   Procedure: PACEMAKER IMPLANT;  Surgeon: Evans Lance, MD;  Location: [place] CV LAB;  Service: Cardiovascular;  Laterality: N/A;  . PACEMAKER IMPLANT    . RIGHT HEART CATH N/A 02/09/2020   Procedure: RIGHT HEART CATH;  Surgeon: Larey Dresser, MD;  Location: [place] CV LAB;  Service: Cardiovascular;  Laterality: N/A;  . RIGHT/LEFT HEART CATH AND CORONARY ANGIOGRAPHY N/A 12/01/2019   Procedure: RIGHT/LEFT HEART CATH AND CORONARY ANGIOGRAPHY;  Surgeon: Larey Dresser, MD;  Location: [place] CV LAB;  Service: Cardiovascular;  Laterality: N/A;  . ROTATOR CUFF REPAIR Bilateral 2017  . SINUS EXPLORATION    . TOTAL KNEE ARTHROPLASTY Left 12/10/2017   Procedure: LEFT TOTAL KNEE ARTHROPLASTY;  Surgeon: Sydnee Cabal, MD;  Location: [facility] ORS;  Service: Orthopedics;  Laterality: Left;  Adductor Block  . TUBAL LIGATION    . VIDEO BRONCHOSCOPY N/A 02/08/2020  Procedure: VIDEO BRONCHOSCOPY WITH FLUORO;  Surgeon: Juanito Doom, MD;  Location: [place];  Service: Cardiopulmonary;  Laterality: N/A;    Current Medications: Current Meds  Medication Sig  . ACCU-CHEK AVIVA PLUS test strip 3 (three) times daily.  Marland Kitchen acetaminophen (TYLENOL) 325 MG tablet Take 2 tablets (650 mg total) by mouth every 4 (four) hours as needed for headache or mild pain.  Marland Kitchen albuterol (PROVENTIL  HFA;VENTOLIN HFA) 108 (90 Base) MCG/ACT inhaler Inhale 1-2 puffs into the lungs every 6 (six) hours as needed for wheezing or shortness of breath.  Marland Kitchen amiodarone (PACERONE) 200 MG tablet Take 1 tablet (200 mg total) by mouth 2 (two) times daily.  Marland Kitchen apixaban (ELIQUIS) 5 MG TABS tablet Take 1 tablet (5 mg total) by mouth 2 (two) times daily.  . Biotin 10 MG CAPS Take 10 mg by mouth daily.  . carvedilol (COREG) 3.125 MG tablet Take 1 tablet (3.125 mg total) by mouth 2 (two) times daily with a meal.  . Cholecalciferol (VITAMIN D-3) 25 MCG (1000 UT) CAPS Take 1,000 Units by mouth daily.   . dapagliflozin propanediol (FARXIGA) 10 MG TABS tablet Take 1 tablet (10 mg total) by mouth daily.  . digoxin (LANOXIN) 0.125 MG tablet Take 1 tablet (0.125 mg total) by mouth daily for 7 days.  . folic acid (FOLVITE) 1 MG tablet Take 1 tablet (1 mg total) by mouth daily.  Marland Kitchen gabapentin (NEURONTIN) 600 MG tablet Take 600 mg by mouth 2 (two) times daily.  Marland Kitchen glucose blood (COOL BLOOD GLUCOSE TEST STRIPS) test strip Use as instructed  . HUMALOG KWIKPEN 100 UNIT/ML KwikPen Sliding scale as directed  . HYDROcodone-acetaminophen (NORCO) 10-325 MG tablet Take 1 tablet by mouth 4 (four) times daily.   . insulin detemir (LEVEMIR) 100 UNIT/ML FlexPen Inject 7 Units into the skin daily.  . Insulin Pen Needle (PEN NEEDLES) 32G X 4 MM MISC 1 Units by Does not apply route daily.  . metFORMIN (GLUCOPHAGE) 500 MG tablet Take 500 mg by mouth 2 (two) times daily.  . methotrexate 2.5 MG tablet Take 12.5 mg (5 tabs) on Wed 10/6.  Then take 15 mg (6 tabs) on Wed 10/13 and Wed 10/20.  Then take 17.5 mg (7 tabs) on Wed 10/27 and Wed 11/3.  Then take 20 mg (8 tabs) every Wed thereafter.  Caution:Chemotherapy. Protect from light.  . Multiple Vitamins-Minerals (CENTRUM SILVER 50+WOMEN) TABS Take 1 tablet by mouth daily.  Marland Kitchen omeprazole (PRILOSEC) 40 MG capsule Take 40 mg by mouth daily.  . pravastatin (PRAVACHOL) 10 MG tablet Take 10 mg by  mouth daily.  . predniSONE (DELTASONE) 20 MG tablet Take 3 tablets (60 mg total) by mouth daily with breakfast.  . sacubitril-valsartan (ENTRESTO) 24-26 MG Take 1 tablet by mouth 2 (two) times daily.  Marland Kitchen spironolactone (ALDACTONE) 25 MG tablet Take 1 tablet (25 mg total) by mouth daily.  Marland Kitchen sulfamethoxazole-trimethoprim (BACTRIM DS) 800-160 MG tablet Take 1 tablet by mouth 3 (three) times a week.     Allergies:   Lisinopril, Ace inhibitors, Augmentin [amoxicillin-pot clavulanate], Benazepril, Oxycodone-acetaminophen, and Sulfa antibiotics   Social History   Socioeconomic History  . Marital status: Married    Spouse name: Not on file  . Number of children: Not on file  . Years of education: Not on file  . Highest education level: Not on file  Occupational History  . Not on file  Tobacco Use  . Smoking status: Never Smoker  . Smokeless tobacco: Never Used  Vaping Use  . Vaping Use: Never used  Substance and Sexual Activity  . Alcohol use: No  . Drug use: No  . Sexual activity: Yes    Birth control/protection: Post-menopausal  Other Topics Concern  . Not on file  Social History Narrative  . Not on file   Social Determinants of Health   Financial Resource Strain:   . Difficulty of Paying Living Expenses: Not on file  Food Insecurity:   . Worried About Charity fundraiser in the Last Year: Not on file  . Ran Out of Food in the Last Year: Not on file  Transportation Needs:   . Lack of Transportation (Medical): Not on file  . Lack of Transportation (Non-Medical): Not on file  Physical Activity:   . Days of Exercise per Week: Not on file  . Minutes of Exercise per Session: Not on file  Stress:   . Feeling of Stress : Not on file  Social Connections:   . Frequency of Communication with Friends and Family: Not on file  . Frequency of Social Gatherings with Friends and Family: Not on file  . Attends Religious Services: Not on file  . Active Member of Clubs or Organizations:  Not on file  . Attends Archivist Meetings: Not on file  . Marital Status: Not on file     Family History: The patient's family history includes Alzheimer's disease in her mother; Colon cancer in her maternal grandmother.  ROS:   Please see the history of present illness.    All other systems reviewed and are negative.  EKGs/Labs/Other Studies Reviewed:    The following studies were reviewed today: Right heart cath, echo  February 09, 2020 right heart cath personally reviewed Right atrial pressure 1, right ventricular pressure 15/1, PA pressure 13/5, mean 9 PCWP mean 3 Cardiac output by Fick 4.78 Cardiac index by Fick 2.79  January 26, 2020 PET from [place] Findings in the myocardium are consistent with active inflammation, no evidence of metabolically active extracardiac sarcoidosis   February 13, 2020 device check In October there have been 10 sustained ventricular tachycardia episodes.  9 of these tachycardia episodes have required 5 or less ATP schemes to terminate.  On October 2 at 4:26 PM it was a ventricular tachycardia with an average heart rate of 113 (max heart rate 128) that exhausted 10 ATP schemes and then ultimately self terminated.  The episode lasted 24 minutes and 24 seconds.  No shocks have been delivered Parameters are all stable No changes were made to her device programming today given her ATP schemes were mostly effective  Recent Labs: 02/06/2020: B Natriuretic Peptide 344.3; TSH 2.235 02/07/2020: ALT 20; Magnesium 2.3 02/09/2020: BUN 21; Creatinine, Ser 0.73; Hemoglobin 14.3; Platelets 210; Potassium 3.8; Sodium 140  Recent Lipid Panel    Component Value Date/Time   CHOL 178 09/28/2018 0228   TRIG 91 09/28/2018 0228   HDL 71 09/28/2018 0228   CHOLHDL 2.5 09/28/2018 0228   VLDL 18 09/28/2018 0228   LDLCALC 89 09/28/2018 0228    Physical Exam:    VS:  BP 110/76   Pulse 91   Ht 5' (1.524 m)   Wt 160 lb (72.6 kg)   LMP 07/25/2015 (Exact Date)    SpO2 96%   BMI 31.25 kg/m     Wt Readings from Last 3 Encounters:  02/13/20 160 lb (72.6 kg)  02/09/20 165 lb (74.8 kg)  01/11/20 170 lb (77.1 kg)  GEN: Well nourished, well developed in no acute distress HEENT: Normal NECK: No JVD; No carotid bruits LYMPHATICS: No lymphadenopathy CARDIAC: RRR, no murmurs, rubs, gallops RESPIRATORY:  Clear to auscultation without rales, wheezing or rhonchi  ABDOMEN: Soft, non-tender, non-distended MUSCULOSKELETAL:  No edema; No deformity  SKIN: Warm and dry NEUROLOGIC:  Alert and oriented x 3 PSYCHIATRIC:  Normal affect   ASSESSMENT:    1. Complete heart block ([place])   2. Ventricular tachycardia ([place])   3. ICD (implantable cardioverter-defibrillator) in place   4. Cardiac sarcoidosis    PLAN:    In order of problems listed above:  1. Ventricular tachycardia secondary to cardiac sarcoidosis status post ICD implant. The patient has cardiac sarcoidosis as confirmed by recent PET scan at [facility].  She is on immunosuppression with prednisone and methotrexate.  She is also on folic acid and Bactrim.  Plan to continue her immunosuppression regimen and closely follow along with her heart failure colleagues.  She continues to have ventricular tachycardia albeit with much less frequency.  These episodes are being successfully treated with ATP from her device.  Plan to see back in 4 weeks.  2.  Diabetes Currently suboptimally controlled given the high-dose prednisone she is taking.  She is working with her primary care closely to titrate her insulin regimen.  3.  Complete heart block Patient is pacemaker dependent.  Conduction disease secondary to cardiac sarcoidosis.  Medication Adjustments/Labs and Tests Ordered: Current medicines are reviewed at length with the patient today.  Concerns regarding medicines are outlined above.  Orders Placed This Encounter  Procedures  . EKG 12-Lead   No orders of the defined types were placed in this  encounter.    Signed, Lars Mage, MD, [facility]  02/13/2020 12:41 PM    Electrophysiology [place]

## 2020-02-13 NOTE — Patient Instructions (Addendum)
Medication Instructions:  Your physician recommends that you continue on your current medications as directed. Please refer to the Current Medication list given to you today. *If you need a refill on your cardiac medications before your next appointment, please call your pharmacy*  Lab Work: None ordered. If you have labs (blood work) drawn today and your tests are completely normal, you will receive your results only by: Marland Kitchen MyChart Message (if you have MyChart) OR . A paper copy in the mail If you have any lab test that is abnormal or we need to change your treatment, we will call you to review the results.  Testing/Procedures: None ordered.  Follow-Up: At [facility], you and your health needs are our priority.  As part of our continuing mission to provide you with exceptional heart care, we have created designated Provider Care Teams.  These Care Teams include your primary Cardiologist (physician) and Advanced Practice Providers (APPs -  Physician Assistants and Nurse Practitioners) who all work together to provide you with the care you need, when you need it.  Your next appointment:   Your physician wants you to follow-up in: March 12, 2020 with Dr. Quentin Ore (as previously scheduled)  Remote monitoring is used to monitor your ICD from home. This monitoring reduces the number of office visits required to check your device to one time per year. It allows [place] to keep an eye on the functioning of your device to ensure it is working properly. You are scheduled for a device check from home on 03/08/2020. You may send your transmission at any time that day. If you have a wireless device, the transmission will be sent automatically. After your physician reviews your transmission, you will receive a postcard with your next transmission date.

## 2020-02-16 LAB — CUP PACEART REMOTE DEVICE CHECK
Battery Remaining Longevity: 88 mo
Battery Voltage: 3.01 V
Brady Statistic AP VP Percent: 20.87 %
Brady Statistic AP VS Percent: 0.22 %
Brady Statistic AS VP Percent: 77.58 %
Brady Statistic AS VS Percent: 1.33 %
Brady Statistic RA Percent Paced: 20.37 %
Brady Statistic RV Percent Paced: 95.88 %
Date Time Interrogation Session: 20211007093322
HighPow Impedance: 84 Ohm
Implantable Lead Implant Date: 20201005
Implantable Lead Implant Date: 20201005
Implantable Lead Implant Date: 20210729
Implantable Lead Location: 753859
Implantable Lead Location: 753860
Implantable Lead Location: 753860
Implantable Lead Model: 3830
Implantable Lead Model: 5076
Implantable Lead Model: 6935
Implantable Pulse Generator Implant Date: 20210729
Lead Channel Impedance Value: 247 Ohm
Lead Channel Impedance Value: 285 Ohm
Lead Channel Impedance Value: 361 Ohm
Lead Channel Impedance Value: 418 Ohm
Lead Channel Impedance Value: 589 Ohm
Lead Channel Impedance Value: 646 Ohm
Lead Channel Pacing Threshold Amplitude: 0.375 V
Lead Channel Pacing Threshold Amplitude: 0.75 V
Lead Channel Pacing Threshold Amplitude: 0.875 V
Lead Channel Pacing Threshold Pulse Width: 0.4 ms
Lead Channel Pacing Threshold Pulse Width: 0.4 ms
Lead Channel Pacing Threshold Pulse Width: 0.4 ms
Lead Channel Sensing Intrinsic Amplitude: 10.875 mV
Lead Channel Sensing Intrinsic Amplitude: 10.875 mV
Lead Channel Sensing Intrinsic Amplitude: 5.625 mV
Lead Channel Sensing Intrinsic Amplitude: 5.625 mV
Lead Channel Setting Pacing Amplitude: 1.5 V
Lead Channel Setting Pacing Amplitude: 2.5 V
Lead Channel Setting Pacing Amplitude: 3.5 V
Lead Channel Setting Pacing Pulse Width: 0.4 ms
Lead Channel Setting Pacing Pulse Width: 0.4 ms
Lead Channel Setting Sensing Sensitivity: 0.3 mV

## 2020-02-22 ENCOUNTER — Other Ambulatory Visit (HOSPITAL_COMMUNITY): Payer: Self-pay | Admitting: Cardiology

## 2020-02-24 ENCOUNTER — Emergency Department (HOSPITAL_COMMUNITY)
Admission: EM | Admit: 2020-02-24 | Discharge: 2020-02-24 | Disposition: A | Discharge status: Home / Self Care | Payer: Medicare Other | Attending: Emergency Medicine | Admitting: Emergency Medicine

## 2020-02-24 ENCOUNTER — Telehealth: Payer: Self-pay | Admitting: Medical

## 2020-02-24 ENCOUNTER — Other Ambulatory Visit: Payer: Self-pay

## 2020-02-24 DIAGNOSIS — R42 Dizziness and giddiness: Secondary | ICD-10-CM | POA: Insufficient documentation

## 2020-02-24 DIAGNOSIS — Z5321 Procedure and treatment not carried out due to patient leaving prior to being seen by health care provider: Secondary | ICD-10-CM | POA: Diagnosis not present

## 2020-02-24 LAB — CBC
HCT: 48.7 % — ABNORMAL HIGH (ref 36.0–46.0)
Hemoglobin: 15 g/dL (ref 12.0–15.0)
MCH: 28.2 pg (ref 26.0–34.0)
MCHC: 30.8 g/dL (ref 30.0–36.0)
MCV: 91.5 fL (ref 80.0–100.0)
Platelets: 243 10*3/uL (ref 150–400)
RBC: 5.32 MIL/uL — ABNORMAL HIGH (ref 3.87–5.11)
RDW: 22.8 % — ABNORMAL HIGH (ref 11.5–15.5)
WBC: 9.5 10*3/uL (ref 4.0–10.5)
nRBC: 0.2 % (ref 0.0–0.2)

## 2020-02-24 LAB — BASIC METABOLIC PANEL
Anion gap: 10 (ref 5–15)
BUN: 21 mg/dL — ABNORMAL HIGH (ref 6–20)
CO2: 26 mmol/L (ref 22–32)
Calcium: 9.2 mg/dL (ref 8.9–10.3)
Chloride: 100 mmol/L (ref 98–111)
Creatinine, Ser: 1.19 mg/dL — ABNORMAL HIGH (ref 0.44–1.00)
GFR, Estimated: 51 mL/min — ABNORMAL LOW (ref >60)
Glucose, Bld: 308 mg/dL — ABNORMAL HIGH (ref 70–99)
Potassium: 4.5 mmol/L (ref 3.5–5.1)
Sodium: 136 mmol/L (ref 135–145)

## 2020-02-24 NOTE — ED Notes (Signed)
Called multiple times-NO ANSWER

## 2020-02-24 NOTE — Telephone Encounter (Signed)
   Notified by Dr. Curt Bears that patients remote device monitoring suggested sustained VT with exhausted VT therapies by her ICD. He recommends that she present to the closest ED as soon as possible. I contacted the patient who reports some dizziness this morning. No ICD shocks. She will plan to come to [facility] ED for further management. Will notify triage nurse of impending arrival.  Abigail Butts, PA-C 02/24/20; 10:00 AM

## 2020-02-24 NOTE — ED Triage Notes (Signed)
Pt. Stated, My Dr called and said Im having a lot of PVC to come here,. Lisa Poole been having some dizziness

## 2020-02-24 NOTE — Telephone Encounter (Signed)
Patient presented to the ED as instructed. She was placed in the waiting area due to high call volumes. Spoke with the triage RN/ED staff several times to express the importance of rooming her so we could attempt to get her out of VT. Patient felt asymptomatic while in the ED and grew increasingly frustrated with the wait. I contacted her again to update her to our efforts and encourage her to stay, though she reports she left the ED already. I spoke with Dr. Curt Bears who recommended increasing her amiodarone to 400mg  BID and close follow-up on Monday. We discussed ED precautions should symptoms change. She was in agreement with the plan. Will send a message to EP scheduling to assist with close follow-up on Monday.   Abigail Butts, PA-C 02/24/20; 1:23 PM

## 2020-02-26 ENCOUNTER — Ambulatory Visit (HOSPITAL_COMMUNITY)
Admit: 2020-02-26 | Discharge: 2020-02-26 | Disposition: A | Discharge status: Home / Self Care | Payer: Medicare Other | Attending: Cardiology | Admitting: Cardiology

## 2020-02-26 ENCOUNTER — Encounter: Payer: Self-pay | Admitting: Cardiology

## 2020-02-26 ENCOUNTER — Ambulatory Visit (INDEPENDENT_AMBULATORY_CARE_PROVIDER_SITE_OTHER): Payer: Medicare Other | Admitting: Cardiology

## 2020-02-26 ENCOUNTER — Telehealth: Payer: Self-pay

## 2020-02-26 ENCOUNTER — Other Ambulatory Visit: Payer: Self-pay

## 2020-02-26 VITALS — BP 96/68 | HR 70 | Ht 60.0 in | Wt 167.8 lb

## 2020-02-26 VITALS — BP 116/75 | HR 73 | Wt 167.6 lb

## 2020-02-26 DIAGNOSIS — I110 Hypertensive heart disease with heart failure: Secondary | ICD-10-CM | POA: Insufficient documentation

## 2020-02-26 DIAGNOSIS — I472 Ventricular tachycardia, unspecified: Secondary | ICD-10-CM

## 2020-02-26 DIAGNOSIS — Z79899 Other long term (current) drug therapy: Secondary | ICD-10-CM | POA: Insufficient documentation

## 2020-02-26 DIAGNOSIS — Z8616 Personal history of COVID-19: Secondary | ICD-10-CM | POA: Insufficient documentation

## 2020-02-26 DIAGNOSIS — Z7952 Long term (current) use of systemic steroids: Secondary | ICD-10-CM | POA: Insufficient documentation

## 2020-02-26 DIAGNOSIS — Z95810 Presence of automatic (implantable) cardiac defibrillator: Secondary | ICD-10-CM

## 2020-02-26 DIAGNOSIS — I480 Paroxysmal atrial fibrillation: Secondary | ICD-10-CM | POA: Diagnosis not present

## 2020-02-26 DIAGNOSIS — I442 Atrioventricular block, complete: Secondary | ICD-10-CM | POA: Diagnosis not present

## 2020-02-26 DIAGNOSIS — E119 Type 2 diabetes mellitus without complications: Secondary | ICD-10-CM | POA: Diagnosis not present

## 2020-02-26 DIAGNOSIS — Z950 Presence of cardiac pacemaker: Secondary | ICD-10-CM | POA: Diagnosis not present

## 2020-02-26 DIAGNOSIS — D8685 Sarcoid myocarditis: Secondary | ICD-10-CM | POA: Diagnosis not present

## 2020-02-26 DIAGNOSIS — I5022 Chronic systolic (congestive) heart failure: Secondary | ICD-10-CM

## 2020-02-26 DIAGNOSIS — Z794 Long term (current) use of insulin: Secondary | ICD-10-CM | POA: Insufficient documentation

## 2020-02-26 DIAGNOSIS — I428 Other cardiomyopathies: Secondary | ICD-10-CM | POA: Diagnosis not present

## 2020-02-26 DIAGNOSIS — Z86718 Personal history of other venous thrombosis and embolism: Secondary | ICD-10-CM | POA: Insufficient documentation

## 2020-02-26 DIAGNOSIS — E785 Hyperlipidemia, unspecified: Secondary | ICD-10-CM | POA: Insufficient documentation

## 2020-02-26 DIAGNOSIS — Z7901 Long term (current) use of anticoagulants: Secondary | ICD-10-CM | POA: Diagnosis not present

## 2020-02-26 LAB — COMPREHENSIVE METABOLIC PANEL
ALT: 31 U/L (ref 0–44)
AST: 24 U/L (ref 15–41)
Albumin: 3.3 g/dL — ABNORMAL LOW (ref 3.5–5.0)
Alkaline Phosphatase: 29 U/L — ABNORMAL LOW (ref 38–126)
Anion gap: 9 (ref 5–15)
BUN: 22 mg/dL — ABNORMAL HIGH (ref 6–20)
CO2: 27 mmol/L (ref 22–32)
Calcium: 9.4 mg/dL (ref 8.9–10.3)
Chloride: 103 mmol/L (ref 98–111)
Creatinine, Ser: 0.96 mg/dL (ref 0.44–1.00)
GFR, Estimated: >60 mL/min (ref >60)
Glucose, Bld: 245 mg/dL — ABNORMAL HIGH (ref 70–99)
Potassium: 5.2 mmol/L — ABNORMAL HIGH (ref 3.5–5.1)
Sodium: 139 mmol/L (ref 135–145)
Total Bilirubin: 0.6 mg/dL (ref 0.3–1.2)
Total Protein: 5.7 g/dL — ABNORMAL LOW (ref 6.5–8.1)

## 2020-02-26 MED ORDER — METHOTREXATE SODIUM 2.5 MG PO TABS: ORAL_TABLET | ORAL | 1 refills | Status: DC

## 2020-02-26 MED ORDER — MEXILETINE HCL 150 MG PO CAPS: 150.0000 mg | ORAL_CAPSULE | Freq: Two times a day (BID) | ORAL | 11 refills | Status: AC

## 2020-02-26 MED ORDER — CARVEDILOL 6.25 MG PO TABS: 6.2500 mg | ORAL_TABLET | Freq: Two times a day (BID) | ORAL | 3 refills | Status: DC

## 2020-02-26 NOTE — Patient Instructions (Addendum)
Medication Instructions:  Your physician has recommended you make the following change in your medication:   1.  START taking mexiletine 150 mg- Take one tablet by mouth twice a day  Lab Work: None ordered. If you have labs (blood work) drawn today and your tests are completely normal, you will receive your results only by: Marland Kitchen MyChart Message (if you have MyChart) OR . A paper copy in the mail If you have any lab test that is abnormal or we need to change your treatment, we will call you to review the results.  Testing/Procedures: None ordered.  Follow-Up: At [facility], you and your health needs are our priority.  As part of our continuing mission to provide you with exceptional heart care, we have created designated Provider Care Teams.  These Care Teams include your primary Cardiologist (physician) and Advanced Practice Providers (APPs -  Physician Assistants and Nurse Practitioners) who all work together to provide you with the care you need, when you need it.  Your next appointment:    March 12, 2020 at 1:45 pm  Remote monitoring is used to monitor your ICD from home. This monitoring reduces the number of office visits required to check your device to one time per year. It allows [place] to keep an eye on the functioning of your device to ensure it is working properly. You are scheduled for a device check from home on 03/08/2020. You may send your transmission at any time that day. If you have a wireless device, the transmission will be sent automatically. After your physician reviews your transmission, you will receive a postcard with your next transmission date.  Mexiletine capsules What is this medicine? MEXILETINE (mex IL e teen) is an antiarrhythmic agent. This medicine is used to treat irregular heart rhythm and can slow rapid heartbeats. It can help your heart to return to and maintain a normal rhythm. Because of the side effects caused by this medicine, it is usually used for  heartbeat problems that may be life-threatening. This medicine may be used for other purposes; ask your health care provider or pharmacist if you have questions. COMMON BRAND NAME(S): Mexitil What should I tell my health care provider before I take this medicine? They need to know if you have any of these conditions:  liver disease  other heart problems  previous heart attack  an unusual or allergic reaction to mexiletine, other medicines, foods, dyes, or preservatives  pregnant or trying to get pregnant  breast-feeding How should I use this medicine? Take this medicine by mouth with a glass of water. Follow the directions on the prescription label. It is recommended that you take this medicine with food or an antacid. Take your doses at regular intervals. Do not take your medicine more often than directed. Do not stop taking except on the advice of your doctor or health care professional. Talk to your pediatrician regarding the use of this medicine in children. Special care may be needed. Overdosage: If you think you have taken too much of this medicine contact a poison control center or emergency room at once. NOTE: This medicine is only for you. Do not share this medicine with others. What if I miss a dose? If you miss a dose, take it as soon as you can. If it is almost time for your next dose, take only that dose. Do not take double or extra doses. What may interact with this medicine? Do not take this medicine with any of the following  medications:  dofetilide This medicine may also interact with the following medications:  caffeine  cimetidine  medicines for depression, anxiety, or psychotic disturbances  medicines to control heart rhythm  phenobarbital  phenytoin  rifampin  theophylline This list may not describe all possible interactions. Give your health care provider a list of all the medicines, herbs, non-prescription drugs, or dietary supplements you use. Also  tell them if you smoke, drink alcohol, or use illegal drugs. Some items may interact with your medicine. What should I watch for while using this medicine? Your condition will be monitored closely when you first begin therapy. Often, this drug is first started in a hospital or other monitored health care setting. Once you are on maintenance therapy, visit your doctor or health care provider for regular checks on your progress. Because your condition and use of this medicine carry some risk, it is a good idea to carry an identification card, necklace or bracelet with details of your condition, medications, and doctor or health care provider. You may get drowsy or dizzy. Do not drive, use machinery, or do anything that needs mental alertness until you know how this medicine affects you. Do not stand or sit up quickly, especially if you are an older patient. This reduces the risk of dizzy or fainting spells. Alcohol can make you more dizzy, increase flushing and rapid heartbeats. Avoid alcoholic drinks. This medicine may cause serious skin reactions. They can happen weeks to months after starting the medicine. Contact your health care provider right away if you notice fevers or flu-like symptoms with a rash. The rash may be red or purple and then turn into blisters or peeling of the skin. Or, you might notice a red rash with swelling of the face, lips or lymph nodes in your neck or under your arms. What side effects may I notice from receiving this medicine? Side effects that you should report to your doctor or health care professional as soon as possible:  allergic reactions like skin rash, itching or hives, swelling of the face, lips, or tongue  breathing problems  chest pain, continued irregular heartbeats  rash, fever, and swollen lymph nodes  redness, blistering, peeling or loosening of the skin, including inside the mouth  seizures  skin rash  trembling, shaking  unusual bleeding or  bruising  unusually weak or tired Side effects that usually do not require medical attention (report to your doctor or health care professional if they continue or are bothersome):  blurred vision  difficulty walking  heartburn  nausea, vomiting  nervousness  numbness, or tingling in the fingers or toes This list may not describe all possible side effects. Call your doctor for medical advice about side effects. You may report side effects to FDA at 1-800-FDA-1088. Where should I keep my medicine? Keep out of reach of children. Store at room temperature between 15 and 30 degrees C (59 and 86 degrees F). Throw away any unused medicine after the expiration date. NOTE: This sheet is a summary. It may not cover all possible information. If you have questions about this medicine, talk to your doctor, pharmacist, or health care provider.  2020 Elsevier/Gold Standard (2018-08-03 09:25:42)

## 2020-02-26 NOTE — Telephone Encounter (Signed)
Carelink Alert received- Pt in Slow VT on 10/16.  She was contacted by on-call PA and directed to go to ED.  She left without being seen.  Dr. Curt Bears requested we contact patient to have come in for visit today.  Spoke with pt, she is feeling good this morning, no cardiac complaints. Pt to send manual transmission to confirm current rhythm.  Pt scheduled for OV at 12:15 with Dr. Quentin Ore.

## 2020-02-26 NOTE — Progress Notes (Signed)
Electrophysiology Office Follow up Visit Note:    Date:  02/26/2020   ID:  Lisa Poole, DOB [birth year], MRN [id number]  PCP:  Milford Cage, PA  [facility] Cardiologist:  No primary care provider on file.  [facility] Electrophysiologist:  None    Interval History:    Lisa Poole is a 54 y.o. female who presents for a follow up visit. They were last seen in clinic February 13, 2020. Since their last appointment, she has had more episodes of ventricular tachycardia treated with ATP.  She is also had episodes of ventricular tachycardia that have not responded to maximal treatment with ATP.  She has not received any ICD shocks.  She is overall felt okay without syncope or presyncope or significant change in her heart failure symptoms.  She continues to take her prednisone and is working with her primary care physician on her insulin regimen to cover the associated hyperglycemia. After she exhausted all ATP schemes over the weekend, it was recommended to the patient that she present to the emergency department for evaluation given ongoing ventricular tachycardia.  She presented to the ER but after waiting several hours went back home.  Today she is back out of VT.   Past Medical History:  Diagnosis Date  . Aortic insufficiency   . Asthma 09/13/2006  . Complete heart block ([place]) 02/11/2019  . Cystocele without uterine prolapse 12/12/2018  . Diabetes mellitus without complication ([place])   . Elevated troponin level   . Hypercholesteremia   . Hypertension   . Primary osteoarthritis of left knee 12/10/2017    Past Surgical History:  Procedure Laterality Date  . APPENDECTOMY    . BIOPSY  02/08/2020   Procedure: BIOPSY;  Surgeon: Juanito Doom, MD;  Location: [facility] ENDOSCOPY;  Service: Cardiopulmonary;;  . BIV ICD INSERTION CRT-D N/A 12/07/2019   Procedure: BIV ICD INSERTION CRT-D;  Surgeon: Evans Lance, MD;  Location: [place] CV LAB;  Service: Cardiovascular;  Laterality:  N/A;  . BRONCHIAL WASHINGS  02/08/2020   Procedure: BRONCHIAL WASHINGS;  Surgeon: Juanito Doom, MD;  Location: [facility] ENDOSCOPY;  Service: Cardiopulmonary;;  . IABP INSERTION N/A 12/01/2019   Procedure: IABP Insertion;  Surgeon: Larey Dresser, MD;  Location: [place] CV LAB;  Service: Cardiovascular;  Laterality: N/A;  . LEFT HEART CATH AND CORONARY ANGIOGRAPHY N/A 09/28/2018   Procedure: LEFT HEART CATH AND CORONARY ANGIOGRAPHY;  Surgeon: Troy Sine, MD;  Location: [place] CV LAB;  Service: Cardiovascular;  Laterality: N/A;  . PACEMAKER IMPLANT N/A 02/13/2019   Procedure: PACEMAKER IMPLANT;  Surgeon: Evans Lance, MD;  Location: [place] CV LAB;  Service: Cardiovascular;  Laterality: N/A;  . PACEMAKER IMPLANT    . RIGHT HEART CATH N/A 02/09/2020   Procedure: RIGHT HEART CATH;  Surgeon: Larey Dresser, MD;  Location: [place] CV LAB;  Service: Cardiovascular;  Laterality: N/A;  . RIGHT/LEFT HEART CATH AND CORONARY ANGIOGRAPHY N/A 12/01/2019   Procedure: RIGHT/LEFT HEART CATH AND CORONARY ANGIOGRAPHY;  Surgeon: Larey Dresser, MD;  Location: [place] CV LAB;  Service: Cardiovascular;  Laterality: N/A;  . ROTATOR CUFF REPAIR Bilateral 2017  . SINUS EXPLORATION    . TOTAL KNEE ARTHROPLASTY Left 12/10/2017   Procedure: LEFT TOTAL KNEE ARTHROPLASTY;  Surgeon: Sydnee Cabal, MD;  Location: [facility] ORS;  Service: Orthopedics;  Laterality: Left;  Adductor Block  . TUBAL LIGATION    . VIDEO BRONCHOSCOPY N/A 02/08/2020   Procedure: VIDEO BRONCHOSCOPY WITH  FLUORO;  Surgeon: Juanito Doom, MD;  Location: [place];  Service: Cardiopulmonary;  Laterality: N/A;    Current Medications: Current Meds  Medication Sig  . ACCU-CHEK AVIVA PLUS test strip 3 (three) times daily.  Marland Kitchen acetaminophen (TYLENOL) 325 MG tablet Take 2 tablets (650 mg total) by mouth every 4 (four) hours as needed for headache or mild pain.  Marland Kitchen albuterol (PROVENTIL HFA;VENTOLIN HFA) 108 (90 Base) MCG/ACT  inhaler Inhale 1-2 puffs into the lungs every 6 (six) hours as needed for wheezing or shortness of breath.  Marland Kitchen amiodarone (PACERONE) 200 MG tablet Take 200 mg by mouth 2 (two) times daily.  Marland Kitchen apixaban (ELIQUIS) 5 MG TABS tablet Take 1 tablet (5 mg total) by mouth 2 (two) times daily.  . Biotin 10 MG CAPS Take 10 mg by mouth daily.  . carvedilol (COREG) 6.25 MG tablet Take 1 tablet (6.25 mg total) by mouth 2 (two) times daily with a meal.  . Cholecalciferol (VITAMIN D-3) 25 MCG (1000 UT) CAPS Take 1,000 Units by mouth daily.   Marland Kitchen FARXIGA 10 MG TABS tablet TAKE 1 TABLET(10 MG) BY MOUTH DAILY  . folic acid (FOLVITE) 1 MG tablet Take 1 tablet (1 mg total) by mouth daily.  Marland Kitchen gabapentin (NEURONTIN) 600 MG tablet Take 600 mg by mouth 2 (two) times daily.  Marland Kitchen glucose blood (COOL BLOOD GLUCOSE TEST STRIPS) test strip Use as instructed  . HUMALOG KWIKPEN 100 UNIT/ML KwikPen Sliding scale as directed  . HYDROcodone-acetaminophen (NORCO) 10-325 MG tablet Take 1 tablet by mouth 4 (four) times daily.   . insulin detemir (LEVEMIR) 100 UNIT/ML FlexPen Inject 7 Units into the skin daily.  . Insulin Pen Needle (PEN NEEDLES) 32G X 4 MM MISC 1 Units by Does not apply route daily.  . metFORMIN (GLUCOPHAGE) 500 MG tablet Take 500 mg by mouth 2 (two) times daily.  . methotrexate 2.5 MG tablet Then take 15 mg (6 tabs) on  Wed 10/20.  Then take 17.5 mg (7 tabs) on Wed 10/27 and Wed 11/3.  Then take 20 mg (8 tabs) every Wed thereafter.  Caution:Chemotherapy. Protect from light.  . Multiple Vitamins-Minerals (CENTRUM SILVER 50+WOMEN) TABS Take 1 tablet by mouth daily.  Marland Kitchen omeprazole (PRILOSEC) 40 MG capsule Take 40 mg by mouth daily.  . pravastatin (PRAVACHOL) 10 MG tablet Take 10 mg by mouth daily.  . predniSONE (DELTASONE) 20 MG tablet Take 3 tablets (60 mg total) by mouth daily with breakfast.  . sacubitril-valsartan (ENTRESTO) 24-26 MG Take 1 tablet by mouth 2 (two) times daily.  Marland Kitchen spironolactone (ALDACTONE) 25 MG  tablet TAKE 1 TABLET(25 MG) BY MOUTH DAILY  . sulfamethoxazole-trimethoprim (BACTRIM DS) 800-160 MG tablet Take 1 tablet by mouth 3 (three) times a week.  . venlafaxine (EFFEXOR) 100 MG tablet Take 100 mg by mouth daily. Not sure on strength     Allergies:   Lisinopril, Ace inhibitors, Augmentin [amoxicillin-pot clavulanate], Benazepril, Oxycodone-acetaminophen, and Sulfa antibiotics   Social History   Socioeconomic History  . Marital status: Married    Spouse name: Not on file  . Number of children: Not on file  . Years of education: Not on file  . Highest education level: Not on file  Occupational History  . Not on file  Tobacco Use  . Smoking status: Never Smoker  . Smokeless tobacco: Never Used  Vaping Use  . Vaping Use: Never used  Substance and Sexual Activity  . Alcohol use: No  . Drug use: No  .  Sexual activity: Yes    Birth control/protection: Post-menopausal  Other Topics Concern  . Not on file  Social History Narrative  . Not on file   Social Determinants of Health   Financial Resource Strain:   . Difficulty of Paying Living Expenses: Not on file  Food Insecurity:   . Worried About Charity fundraiser in the Last Year: Not on file  . Ran Out of Food in the Last Year: Not on file  Transportation Needs:   . Lack of Transportation (Medical): Not on file  . Lack of Transportation (Non-Medical): Not on file  Physical Activity:   . Days of Exercise per Week: Not on file  . Minutes of Exercise per Session: Not on file  Stress:   . Feeling of Stress : Not on file  Social Connections:   . Frequency of Communication with Friends and Family: Not on file  . Frequency of Social Gatherings with Friends and Family: Not on file  . Attends Religious Services: Not on file  . Active Member of Clubs or Organizations: Not on file  . Attends Archivist Meetings: Not on file  . Marital Status: Not on file     Family History: The patient's family history includes  Alzheimer's disease in her mother; Colon cancer in her maternal grandmother.  ROS:   Please see the history of present illness.    All other systems reviewed and are negative.  EKGs/Labs/Other Studies Reviewed:    The following studies were reviewed today: Device check  February 26, 2020 device interrogation personally reviewed Lead parameters stable from previous check Battery longevity estimated at 7.1 years Since October 5 there have been 251 episodes of VT Review of EGM show that most episodes respond to the first ATP scheme but there was one episode that exhausted all VT therapies. A sensed V paced 80.6% of the time a paced V paced 16.3% of the time Today the number of ATP schemes was increased in her slow VT zones   Recent Labs: 02/06/2020: B Natriuretic Peptide 344.3; TSH 2.235 02/07/2020: ALT 20; Magnesium 2.3 02/24/2020: BUN 21; Creatinine, Ser 1.19; Hemoglobin 15.0; Platelets 243; Potassium 4.5; Sodium 136  Recent Lipid Panel    Component Value Date/Time   CHOL 178 09/28/2018 0228   TRIG 91 09/28/2018 0228   HDL 71 09/28/2018 0228   CHOLHDL 2.5 09/28/2018 0228   VLDL 18 09/28/2018 0228   LDLCALC 89 09/28/2018 0228    Physical Exam:    VS:  BP 96/68   Pulse 70   Ht 5' (1.524 m)   Wt 167 lb 12.8 oz (76.1 kg)   LMP 07/25/2015 (Exact Date)   SpO2 98%   BMI 32.77 kg/m     Wt Readings from Last 3 Encounters:  02/26/20 167 lb 12.8 oz (76.1 kg)  02/26/20 167 lb 9.6 oz (76 kg)  02/24/20 160 lb (72.6 kg)     GEN:  Well nourished, well developed in no acute distress HEENT: Normal NECK: No JVD; No carotid bruits LYMPHATICS: No lymphadenopathy CARDIAC: RRR, no murmurs, rubs, gallops RESPIRATORY:  Clear to auscultation without rales, wheezing or rhonchi  ABDOMEN: Soft, non-tender, non-distended MUSCULOSKELETAL:  No edema; No deformity  SKIN: Warm and dry NEUROLOGIC:  Alert and oriented x 3 PSYCHIATRIC:  Normal affect   ASSESSMENT:    1. Complete heart block  ([place])   2. Ventricular tachycardia ([place])   3. ICD (implantable cardioverter-defibrillator) in place   4. Cardiac sarcoidosis  PLAN:    In order of problems listed above:  1. Complete heart block CRT-D in situ, well-functioning on today's check  2.  Ventricular tachycardia secondary to cardiac sarcoidosis Not well controlled on the current therapy of amiodarone. While her cardiac output seems preserved, there is still electrical instability of Ms. Neuberger sarcoidosis She should continue her prednisone 60 mg daily in addition to her methotrexate/Bactrim/folic acid. Continue amiodarone, agree with Dr. Claris Gladden recommendation to decrease to 200 mg daily Start mexiletine 150 mg by mouth twice daily Follow-up 2 weeks. If she continues to display significant burden of ventricular tachycardia will need to consider eligibility for advanced therapies     Medication Adjustments/Labs and Tests Ordered: Current medicines are reviewed at length with the patient today.  Concerns regarding medicines are outlined above.  Orders Placed This Encounter  Procedures  . EKG 12-Lead   Meds ordered this encounter  Medications  . mexiletine (MEXITIL) 150 MG capsule    Sig: Take 1 capsule (150 mg total) by mouth 2 (two) times daily.    Dispense:  60 capsule    Refill:  11     Signed, Lars Mage, MD, [facility]  02/26/2020 1:04 PM    Electrophysiology [facility]

## 2020-02-26 NOTE — Progress Notes (Signed)
Advanced Heart Failure Clinic Note   Referring Physician: PCP: Milford Cage, PA PCP-Cardiologist: Dr. Aundra Dubin EP: Dr. Quentin Ore  HPI:  54 y.o.female w/ h/o HTN, HLD, T2DM, CHB s/p PPM implant 02/2019 followed by Dr. Lovena Le, paroxysmal atrial fibrillation,  COVID-19 infection in April 0160 and systolic heart failure/ nonischemic cardiomyopathy.   In May 2020, she underwent w/u for SOB and mildly elevated troponin I (0.09>>0.10>>0.12>>0.15).   LHC 09/2018 showed no significant coronary disease.Therewas minimal systolic bridging of 5 to 10% in the mid LAD. The left circumflex vesselwas normal. The RCA is a dominant vessel with a superior takeoff andwas normal.Echo 09/2018 showed LVEF 60-65%, mild LVH, RV normal. Mild- mod AI, no aortic stenosis.  In 02/2019, she presented w/ chest pain and found to be in CHB and underwentPPMimplant (Medtr[employer]).    In April 2021, she was diagnosed w/ COVID-19 but did not require hospitalization.   She presented to the [facility] in [facility]/21 w/ complaint of progressive dyspnea, intermittent CP, orthopnea, PND and LEE. Symptoms present for several weeks. In ED, found to be in acute CHF.BNP 1,258. CXR w/ developing edema.HS troponin 32>>52>>67.EKG and tele w/ WCT, felt to be VT. Rates in the 160s-170s. Started on IV amiodarone in ED with conversion to NSR. Echo was repeated in 7/21 and showed new drop in LVEF, down to 20-25% w/ regional wall motion abnormalties,most prominent in anterior, septal, and lateral walls. Moderate to severe TR with elevated RVSP. Moderate MR. LV thrombus excluded by definity contrast. RV systolic function normal.  She developed worsening HF/ cardiogenic shock w/ low Co-ox in the 40s, required inotropic support + placement of IABP. IV amiodarone continued for VT suppression. Developed Torsades/VT x 3 on 12/02/19 with prolonged QT required DC-CV. Amio switched to lido and later started on mexiletine. Had repeat LHC in 7/21 that  showed no significant coronary disease. cMRI c/w possible cardiac sarcoidosis vs prior myocarditis. She was placed on empiric prednisone for possible sarcoid w/ plans to refer to [facility] for cardiac PET scan. Of note, chest CT 7/21 with ground glass lower lobes but no definite evidence for pulmonary sarcoidosis. She underwent device upgrade to CRT-D prior to d/c.  She went home on prednisone.   She was admitted in 8/21 with recurrent VT when prednisone was weaned down some.  Prednisone was increased and methotrexate added, home on mexiletine.   PET scan at [facility] in 8/21 was concerning for active cardiac inflammation consistent with cardiac sarcoidosis.   Back again with VT in 9/21, again with taper of prednisone.  Amiodarone was restarted and mexiletine was stopped, prednisone increased again.  Transbronchial biopsy did not show evidence for pulmonary sarcoidosis. Echo in 9/21 showed EF up some to 35-40% with normal RV.  RHC in 10/21 showed low filling pressures and preserved cardiac output.   She returns today for followup of CHF and cardiac sarcoidosis.  Symptomatically, feeling relatively well.  No dyspnea walking on flat ground.  No chest pain.  No orthopnea/PND.  She has had several runs of slow VT since last hospital discharge, terminated by ATP (no ICD discharges).  Burden of VT overall seems to be decreasing. Blood glucose is still running high during the afternoon (good in the early morning).    Medtronic device interrogation: No AF, she has had several runs slow VT with ATP, no shock.  Thoracic impedance stable.   ECG (personally reviewed): A-BiV sequential pacing.   Review of Systems: All systems reviewed and negative except as per HPI.  PMH: 1. HTN 2. Hyperlipidemia 3. Type 2 diabetes 4. Complete heart block: Suspect due to cardiac sarcoidosis. 10/20 MDT PPM placement, upgraded later to CRT-D.  5. VT: Recurrent.  Suspected due to cardiac sarcoidosis.  - Had MDT CRT-D device.  - Has  history of torsades  6. Cardiac sarcoidosis: Suspected isolated cardiac sarcoidosis.   - Transbronchial biopsy in 10/21 was negative for granulomatous disease.  - Cardiac PET (8/21): Active cardiac inflammation.  - Cardiac MRI (7/21): Patchy LGE in the basal septum, basal-mid inferoseptum/inferior wall, basal anterior wall, mid anterolateral wall. Cardiac MRI LGE pattern could be consistent with cardiac sarcoidosis 7. Chronic systolic CHF: Nonischemic cardiomyopathy, suspect cardiac sarcoidosis.  - Echo (7/21): EF newly decreased to 20-25% with septal-lateral dyssynchrony and septal severe hypokinesis, normal RV, moderate TR, moderate MR.  - LHC/RHC 7/23/21with no significant CAD, elevated filling pressures, and low cardiac output - Echo (9/21): EF 35-40%, mild LVE, normal RV.  - RHC (10/21): mean RA 1, PA 13/5, mean PCWP 3, CI 2.79 8. COVID-19 infection 9. DVT: right IJ DVT 8/21 in setting of central line.    Current Outpatient Medications  Medication Sig Dispense Refill  . ACCU-CHEK AVIVA PLUS test strip 3 (three) times daily.    Marland Kitchen acetaminophen (TYLENOL) 325 MG tablet Take 2 tablets (650 mg total) by mouth every 4 (four) hours as needed for headache or mild pain.    Marland Kitchen albuterol (PROVENTIL HFA;VENTOLIN HFA) 108 (90 Base) MCG/ACT inhaler Inhale 1-2 puffs into the lungs every 6 (six) hours as needed for wheezing or shortness of breath. 1 Inhaler 0  . amiodarone (PACERONE) 200 MG tablet Take 200 mg by mouth 2 (two) times daily.    Marland Kitchen apixaban (ELIQUIS) 5 MG TABS tablet Take 1 tablet (5 mg total) by mouth 2 (two) times daily. 60 tablet 3  . Biotin 10 MG CAPS Take 10 mg by mouth daily.    . carvedilol (COREG) 6.25 MG tablet Take 1 tablet (6.25 mg total) by mouth 2 (two) times daily with a meal. 60 tablet 3  . Cholecalciferol (VITAMIN D-3) 25 MCG (1000 UT) CAPS Take 1,000 Units by mouth daily.     Marland Kitchen FARXIGA 10 MG TABS tablet TAKE 1 TABLET(10 MG) BY MOUTH DAILY 30 tablet 5  . folic acid (FOLVITE)  1 MG tablet Take 1 tablet (1 mg total) by mouth daily. 30 tablet 6  . gabapentin (NEURONTIN) 600 MG tablet Take 600 mg by mouth 2 (two) times daily.    Marland Kitchen glucose blood (COOL BLOOD GLUCOSE TEST STRIPS) test strip Use as instructed 100 each 12  . HUMALOG KWIKPEN 100 UNIT/ML KwikPen Sliding scale as directed    . HYDROcodone-acetaminophen (NORCO) 10-325 MG tablet Take 1 tablet by mouth 4 (four) times daily.     . insulin detemir (LEVEMIR) 100 UNIT/ML FlexPen Inject 7 Units into the skin daily. 15 mL 11  . Insulin Pen Needle (PEN NEEDLES) 32G X 4 MM MISC 1 Units by Does not apply route daily. 30 each 11  . metFORMIN (GLUCOPHAGE) 500 MG tablet Take 500 mg by mouth 2 (two) times daily.    . methotrexate 2.5 MG tablet Then take 15 mg (6 tabs) on  Wed 10/20.  Then take 17.5 mg (7 tabs) on Wed 10/27 and Wed 11/3.  Then take 20 mg (8 tabs) every Wed thereafter.  Caution:Chemotherapy. Protect from light. 28 tablet 1  . Multiple Vitamins-Minerals (CENTRUM SILVER 50+WOMEN) TABS Take 1 tablet by mouth daily.    Marland Kitchen  omeprazole (PRILOSEC) 40 MG capsule Take 40 mg by mouth daily.    . pravastatin (PRAVACHOL) 10 MG tablet Take 10 mg by mouth daily.    . predniSONE (DELTASONE) 20 MG tablet Take 3 tablets (60 mg total) by mouth daily with breakfast. 90 tablet 5  . sacubitril-valsartan (ENTRESTO) 24-26 MG Take 1 tablet by mouth 2 (two) times daily. 60 tablet 5  . spironolactone (ALDACTONE) 25 MG tablet TAKE 1 TABLET(25 MG) BY MOUTH DAILY 30 tablet 5  . sulfamethoxazole-trimethoprim (BACTRIM DS) 800-160 MG tablet Take 1 tablet by mouth 3 (three) times a week. 30 tablet 5  . mexiletine (MEXITIL) 150 MG capsule Take 1 capsule (150 mg total) by mouth 2 (two) times daily. 60 capsule 11  . venlafaxine (EFFEXOR) 100 MG tablet Take 100 mg by mouth daily. Not sure on strength     No current facility-administered medications for this encounter.    Allergies  Allergen Reactions  . Lisinopril Other (See Comments) and Cough      Flu like symptoms, also   . Ace Inhibitors Other (See Comments)    Flu-like symptoms  . Augmentin [Amoxicillin-Pot Clavulanate] Nausea And Vomiting  . Benazepril Other (See Comments)    Flu like symptoms  . Oxycodone-Acetaminophen Nausea And Vomiting  . Sulfa Antibiotics Nausea And Vomiting      Social History   Socioeconomic History  . Marital status: Married    Spouse name: Not on file  . Number of children: Not on file  . Years of education: Not on file  . Highest education level: Not on file  Occupational History  . Not on file  Tobacco Use  . Smoking status: Never Smoker  . Smokeless tobacco: Never Used  Vaping Use  . Vaping Use: Never used  Substance and Sexual Activity  . Alcohol use: No  . Drug use: No  . Sexual activity: Yes    Birth control/protection: Post-menopausal  Other Topics Concern  . Not on file  Social History Narrative  . Not on file   Social Determinants of Health   Financial Resource Strain:   . Difficulty of Paying Living Expenses: Not on file  Food Insecurity:   . Worried About Charity fundraiser in the Last Year: Not on file  . Ran Out of Food in the Last Year: Not on file  Transportation Needs:   . Lack of Transportation (Medical): Not on file  . Lack of Transportation (Non-Medical): Not on file  Physical Activity:   . Days of Exercise per Week: Not on file  . Minutes of Exercise per Session: Not on file  Stress:   . Feeling of Stress : Not on file  Social Connections:   . Frequency of Communication with Friends and Family: Not on file  . Frequency of Social Gatherings with Friends and Family: Not on file  . Attends Religious Services: Not on file  . Active Member of Clubs or Organizations: Not on file  . Attends Archivist Meetings: Not on file  . Marital Status: Not on file  Intimate Partner Violence:   . Fear of Current or Ex-Partner: Not on file  . Emotionally Abused: Not on file  . Physically Abused: Not on  file  . Sexually Abused: Not on file      Family History  Problem Relation Age of Onset  . Colon cancer Maternal Grandmother   . Alzheimer's disease Mother     Vitals:   02/26/20 1127  BP: 116/75  Pulse: 73  SpO2: 96%  Weight: 76 kg (167 lb 9.6 oz)     PHYSICAL EXAM: General: NAD Neck: No JVD, no thyromegaly or thyroid nodule.  Lungs: Clear to auscultation bilaterally with normal respiratory effort. CV: Nondisplaced PMI.  Heart regular S1/S2, no S3/S4, no murmur.  No peripheral edema.  No carotid bruit.  Normal pedal pulses.  Abdomen: Soft, nontender, no hepatosplenomegaly, no distention.  Skin: Intact without lesions or rashes.  Neurologic: Alert and oriented x 3.  Psych: Normal affect. Extremities: No clubbing or cyanosis.  HEENT: Normal.   ASSESSMENT & PLAN: 1.Chronicsystolic CHF: Nonischemic cardiomyopathy. Echoin 7/21with EF newly decreased to 20-25% with septal-lateral dyssynchrony and septal severe hypokinesis, normal RV, moderate TR, moderate MR. Echo in 5/20 prior to PPM placement showed EF 60-65%. She developed shock in 7/21 requiring pressors/inotropes and IABP.LHC/RHC 7/23/21with no significant CAD, elevated filling pressures, and low cardiac output. cMRI 7/27/21showed patchy LGE in the basal septum, basal-mid inferoseptum/inferior wall, basal anterior wall, mid anterolateral wall. Cardiac MRI LGE pattern could be consistent with cardiac sarcoidosis (would explain earlier CHB). Alternatively, prior viral myocarditis (recent COVID-19). CT chest with ground glass lower lobes but no evidence for pulmonary sarcoidosis, transbronchial biopsy did not show granulomatous disease. She is now s/p device upgrade to MDT CRT-D.Cardiac PET in 8/21 was showed active inflammation in the heart, concerning again for cardiac sarcoidosis. Most recent echo in 9/21 with EF up to 35-40%. She is not volume overloaded on exam, NYHA class II symptoms.  - Continue spironolactone 25  mg daily. BMET today.  -Continue Entresto 24/26 bid  -Increase Coreg to 6.25 mg bid.  -Continuedapagliflozin 10 mg daily. 2. Cardiac sarcoidosis: Cardiac MRI and cardiac PET both suggestive of cardiac sarcoidosis, and unexplained CHB also concerning, as is VT.  She has had VT with prednisone weaning.  Suspect isolated cardiac sarcoidosis.  Transbronchial biopsy did not show granulomatous disease.  -Continue prednisone 60 mg daily, decrease to 50 mg daily in about a month with very slow taper once she's on goal dose of methotrexate.  - Continue methotrexate once a week.  Will need to follow CBC, LFTs monthly (check today).Increase MTX by 2.5 mg q2 wks to goal 20-25 mg. - She is on Bactrim for PJP prophylaxis and folate.  3.Recurrent ventricular tachycardia:She has hadVT with multiple morphologies. No coronary disease on recent cath. Suspect as abovethat she has cardiac sarcoidosis (would also explain CHB). Amiodarone stopped in the past due to torsades and very long QTc on amiodarone.  She has tolerated restart of amiodarone.  She is also on prednisone as above and has had increased VT when prednisone is tapered.  -Continue amiodarone, can decrease to 200 mg bid.  Follow LFTs and TSH.  She will need a regular eye exam. -Increase Coreg today as above.  -Looks like she will need very slow prednisone taperwith addition of MTX, as above. 4. H/o ZJQ:[id number] due to cardiac sarcoidosis. Now s/p CRT.  5. Rt IJ DVT: likely from prior central line.  - Will needanticoagulationfor 3 months=>Eliquisuntil around 12/21.   6. Type 2 diabetes: Glucose running high on steroids. She has long-standing diabetes.  -Increase metformin to 1000 mg bid.  - Will need close followup with PCP and have referred to endocrinology. She will be on steroids long-term.   Followup in 1 month.   Loralie Champagne, MD 02/26/20

## 2020-02-26 NOTE — Patient Instructions (Addendum)
INCREASE Carvedilol to 6.25mg  twice daily.  DECREASE Amiodarone to 200mg  twice daily.  Routine lab work today. Will notify you of abnormal results  You have been referred to [place] with Endocrinology. (they will contact you to schedule an appointment)  Follow up in 1 month with Dr.McLean

## 2020-02-27 ENCOUNTER — Telehealth (HOSPITAL_COMMUNITY): Payer: Self-pay

## 2020-02-27 ENCOUNTER — Encounter: Payer: Medicare Other | Admitting: Internal Medicine

## 2020-02-27 MED ORDER — METFORMIN HCL 1000 MG PO TABS: 1000.0000 mg | ORAL_TABLET | Freq: Two times a day (BID) | ORAL | 3 refills | Status: AC

## 2020-02-27 NOTE — Telephone Encounter (Signed)
Patient advised and verbalized understanding. New Rx was sent into patients pharmacy.

## 2020-02-27 NOTE — Telephone Encounter (Signed)
-----   Message from Larey Dresser, MD sent at 02/26/2020  9:04 PM EDT ----- Low K diet, stop any K supplement.  Increase metformin to 1000 mg bid.

## 2020-02-28 ENCOUNTER — Encounter: Payer: Medicare Other | Admitting: Cardiology

## 2020-03-08 ENCOUNTER — Telehealth: Payer: Self-pay

## 2020-03-08 ENCOUNTER — Ambulatory Visit (INDEPENDENT_AMBULATORY_CARE_PROVIDER_SITE_OTHER): Payer: Medicare Other

## 2020-03-08 DIAGNOSIS — I442 Atrioventricular block, complete: Secondary | ICD-10-CM

## 2020-03-08 LAB — CUP PACEART REMOTE DEVICE CHECK
Battery Remaining Longevity: 77 mo
Battery Voltage: 3.03 V
Brady Statistic AP VP Percent: 9.82 %
Brady Statistic AP VS Percent: 0.04 %
Brady Statistic AS VP Percent: 89.75 %
Brady Statistic AS VS Percent: 0.39 %
Brady Statistic RA Percent Paced: 9.81 %
Brady Statistic RV Percent Paced: 99.16 %
Date Time Interrogation Session: 20211029110717
HighPow Impedance: 86 Ohm
Implantable Lead Implant Date: 20201005
Implantable Lead Implant Date: 20201005
Implantable Lead Implant Date: 20210729
Implantable Lead Location: 753859
Implantable Lead Location: 753860
Implantable Lead Location: 753860
Implantable Lead Model: 3830
Implantable Lead Model: 5076
Implantable Lead Model: 6935
Implantable Pulse Generator Implant Date: 20210729
Lead Channel Impedance Value: 228 Ohm
Lead Channel Impedance Value: 285 Ohm
Lead Channel Impedance Value: 361 Ohm
Lead Channel Impedance Value: 456 Ohm
Lead Channel Impedance Value: 475 Ohm
Lead Channel Impedance Value: 589 Ohm
Lead Channel Pacing Threshold Amplitude: 0.5 V
Lead Channel Pacing Threshold Amplitude: 0.75 V
Lead Channel Pacing Threshold Amplitude: 0.875 V
Lead Channel Pacing Threshold Pulse Width: 0.4 ms
Lead Channel Pacing Threshold Pulse Width: 0.4 ms
Lead Channel Pacing Threshold Pulse Width: 0.4 ms
Lead Channel Sensing Intrinsic Amplitude: 18.125 mV
Lead Channel Sensing Intrinsic Amplitude: 18.125 mV
Lead Channel Sensing Intrinsic Amplitude: 3.875 mV
Lead Channel Sensing Intrinsic Amplitude: 3.875 mV
Lead Channel Setting Pacing Amplitude: 1.5 V
Lead Channel Setting Pacing Amplitude: 2.5 V
Lead Channel Setting Pacing Amplitude: 3.25 V
Lead Channel Setting Pacing Pulse Width: 0.4 ms
Lead Channel Setting Pacing Pulse Width: 0.4 ms
Lead Channel Setting Sensing Sensitivity: 0.3 mV

## 2020-03-08 NOTE — Telephone Encounter (Addendum)
[facility] scheduled remote received 03/08/20.  14 NSVT episodes and 2 VT episodes EGM's appear VT 130-140's w/ unknown duration that eventually met  Detection and were pace terminated. Both events occurred 03/06/20 @ 10:09 & 10:06. Patient states she was walking in [place] and felt light headed. Patient reports she has felt better since. Denies any issues with swelling in legs or fluid retention. compliance with medications. Has apt. W/ Dr. Quentin Ore 03/12/20. Patient aware of apt.   Shock plan/driving restrictions reviewed with patient. Verbalized understanding.  Advised patient I will forward to Dr. Quentin Ore and we will call with any changes or recommendations.

## 2020-03-09 ENCOUNTER — Encounter (HOSPITAL_COMMUNITY): Payer: Self-pay

## 2020-03-11 ENCOUNTER — Inpatient Hospital Stay (HOSPITAL_COMMUNITY)
Admission: EM | Admit: 2020-03-11 | Discharge: 2020-03-13 | DRG: 309 | Disposition: A | Discharge status: Other Acute Inpatient Hospital | Payer: Medicare Other | Attending: Cardiology | Admitting: Cardiology

## 2020-03-11 ENCOUNTER — Other Ambulatory Visit: Payer: Self-pay

## 2020-03-11 ENCOUNTER — Encounter: Payer: Medicare Other | Admitting: Internal Medicine

## 2020-03-11 ENCOUNTER — Encounter (HOSPITAL_COMMUNITY): Payer: Self-pay | Admitting: Internal Medicine

## 2020-03-11 ENCOUNTER — Emergency Department (HOSPITAL_COMMUNITY): Payer: Medicare Other

## 2020-03-11 DIAGNOSIS — Z95810 Presence of automatic (implantable) cardiac defibrillator: Secondary | ICD-10-CM

## 2020-03-11 DIAGNOSIS — R339 Retention of urine, unspecified: Secondary | ICD-10-CM | POA: Diagnosis present

## 2020-03-11 DIAGNOSIS — Z4502 Encounter for adjustment and management of automatic implantable cardiac defibrillator: Secondary | ICD-10-CM | POA: Diagnosis not present

## 2020-03-11 DIAGNOSIS — B379 Candidiasis, unspecified: Secondary | ICD-10-CM | POA: Diagnosis present

## 2020-03-11 DIAGNOSIS — R570 Cardiogenic shock: Secondary | ICD-10-CM | POA: Diagnosis not present

## 2020-03-11 DIAGNOSIS — Z8616 Personal history of COVID-19: Secondary | ICD-10-CM | POA: Diagnosis not present

## 2020-03-11 DIAGNOSIS — Z820 Family history of epilepsy and other diseases of the nervous system: Secondary | ICD-10-CM | POA: Diagnosis not present

## 2020-03-11 DIAGNOSIS — I480 Paroxysmal atrial fibrillation: Secondary | ICD-10-CM | POA: Diagnosis present

## 2020-03-11 DIAGNOSIS — I442 Atrioventricular block, complete: Secondary | ICD-10-CM | POA: Diagnosis present

## 2020-03-11 DIAGNOSIS — I071 Rheumatic tricuspid insufficiency: Secondary | ICD-10-CM | POA: Diagnosis present

## 2020-03-11 DIAGNOSIS — Z96652 Presence of left artificial knee joint: Secondary | ICD-10-CM | POA: Diagnosis present

## 2020-03-11 DIAGNOSIS — D8685 Sarcoid myocarditis: Secondary | ICD-10-CM | POA: Diagnosis not present

## 2020-03-11 DIAGNOSIS — I472 Ventricular tachycardia, unspecified: Secondary | ICD-10-CM

## 2020-03-11 DIAGNOSIS — E119 Type 2 diabetes mellitus without complications: Secondary | ICD-10-CM | POA: Diagnosis present

## 2020-03-11 DIAGNOSIS — I110 Hypertensive heart disease with heart failure: Secondary | ICD-10-CM | POA: Diagnosis present

## 2020-03-11 DIAGNOSIS — I428 Other cardiomyopathies: Secondary | ICD-10-CM | POA: Diagnosis present

## 2020-03-11 DIAGNOSIS — R079 Chest pain, unspecified: Secondary | ICD-10-CM

## 2020-03-11 DIAGNOSIS — E785 Hyperlipidemia, unspecified: Secondary | ICD-10-CM | POA: Diagnosis present

## 2020-03-11 DIAGNOSIS — I959 Hypotension, unspecified: Secondary | ICD-10-CM | POA: Diagnosis present

## 2020-03-11 DIAGNOSIS — Z86718 Personal history of other venous thrombosis and embolism: Secondary | ICD-10-CM | POA: Diagnosis not present

## 2020-03-11 DIAGNOSIS — I5022 Chronic systolic (congestive) heart failure: Secondary | ICD-10-CM

## 2020-03-11 HISTORY — DX: Presence of cardiac pacemaker: Z95.0

## 2020-03-11 HISTORY — DX: Presence of automatic (implantable) cardiac defibrillator: Z95.810

## 2020-03-11 LAB — BASIC METABOLIC PANEL
Anion gap: 12 (ref 5–15)
BUN: 28 mg/dL — ABNORMAL HIGH (ref 6–20)
CO2: 22 mmol/L (ref 22–32)
Calcium: 9.2 mg/dL (ref 8.9–10.3)
Chloride: 106 mmol/L (ref 98–111)
Creatinine, Ser: 1.09 mg/dL — ABNORMAL HIGH (ref 0.44–1.00)
GFR, Estimated: 60 mL/min — ABNORMAL LOW (ref >60)
Glucose, Bld: 148 mg/dL — ABNORMAL HIGH (ref 70–99)
Potassium: 3.6 mmol/L (ref 3.5–5.1)
Sodium: 140 mmol/L (ref 135–145)

## 2020-03-11 LAB — CBC WITH DIFFERENTIAL/PLATELET
Abs Immature Granulocytes: 0.11 10*3/uL — ABNORMAL HIGH (ref 0.00–0.07)
Basophils Absolute: 0 10*3/uL (ref 0.0–0.1)
Basophils Relative: 0 %
Eosinophils Absolute: 0 10*3/uL (ref 0.0–0.5)
Eosinophils Relative: 0 %
HCT: 51.1 % — ABNORMAL HIGH (ref 36.0–46.0)
Hemoglobin: 16.2 g/dL — ABNORMAL HIGH (ref 12.0–15.0)
Immature Granulocytes: 1 %
Lymphocytes Relative: 16 %
Lymphs Abs: 1.6 10*3/uL (ref 0.7–4.0)
MCH: 30.1 pg (ref 26.0–34.0)
MCHC: 31.7 g/dL (ref 30.0–36.0)
MCV: 94.8 fL (ref 80.0–100.0)
Monocytes Absolute: 0.2 10*3/uL (ref 0.1–1.0)
Monocytes Relative: 2 %
Neutro Abs: 8 10*3/uL — ABNORMAL HIGH (ref 1.7–7.7)
Neutrophils Relative %: 81 %
Platelets: 268 10*3/uL (ref 150–400)
RBC: 5.39 MIL/uL — ABNORMAL HIGH (ref 3.87–5.11)
RDW: 22.9 % — ABNORMAL HIGH (ref 11.5–15.5)
WBC: 9.9 10*3/uL (ref 4.0–10.5)
nRBC: 0.3 % — ABNORMAL HIGH (ref 0.0–0.2)

## 2020-03-11 LAB — MAGNESIUM: Magnesium: 1.7 mg/dL (ref 1.7–2.4)

## 2020-03-11 LAB — MRSA PCR SCREENING: MRSA by PCR: NEGATIVE

## 2020-03-11 LAB — RESP PANEL BY RT PCR (RSV, FLU A&B, COVID)
Influenza A by PCR: NEGATIVE
Influenza B by PCR: NEGATIVE
Respiratory Syncytial Virus by PCR: NEGATIVE
SARS Coronavirus 2 by RT PCR: NEGATIVE

## 2020-03-11 LAB — GLUCOSE, CAPILLARY
Glucose-Capillary: 197 mg/dL — ABNORMAL HIGH (ref 70–99)
Glucose-Capillary: 165 mg/dL — ABNORMAL HIGH (ref 70–99)

## 2020-03-11 MED ORDER — SODIUM CHLORIDE 0.9 % IV SOLN
250.0000 mL | INTRAVENOUS | Status: DC
  Administered 2020-03-11: 250 mL via INTRAVENOUS

## 2020-03-11 MED ORDER — ONDANSETRON HCL 4 MG/2ML IJ SOLN: 4.0000 mg | Freq: Four times a day (QID) | INTRAMUSCULAR | Status: DC | PRN

## 2020-03-11 MED ORDER — LORAZEPAM 2 MG/ML IJ SOLN
1.0000 mg | Freq: Once | INTRAMUSCULAR | Status: AC
  Administered 2020-03-11: 1 mg via INTRAVENOUS
  Filled 2020-03-11: qty 1

## 2020-03-11 MED ORDER — PRAVASTATIN SODIUM 10 MG PO TABS
10.0000 mg | ORAL_TABLET | Freq: Every day | ORAL | Status: DC
  Administered 2020-03-11 – 2020-03-13 (×3): 10 mg via ORAL
  Filled 2020-03-11 (×3): qty 1

## 2020-03-11 MED ORDER — SULFAMETHOXAZOLE-TRIMETHOPRIM 800-160 MG PO TABS
1.0000 | ORAL_TABLET | ORAL | Status: DC
  Administered 2020-03-11 – 2020-03-13 (×2): 1 via ORAL
  Filled 2020-03-11 (×2): qty 1

## 2020-03-11 MED ORDER — MIDAZOLAM HCL 2 MG/2ML IJ SOLN
1.0000 mg | INTRAMUSCULAR | Status: DC | PRN
  Administered 2020-03-11: 1 mg via INTRAVENOUS
  Filled 2020-03-11: qty 2

## 2020-03-11 MED ORDER — PANTOPRAZOLE SODIUM 40 MG PO TBEC
80.0000 mg | DELAYED_RELEASE_TABLET | Freq: Every day | ORAL | Status: DC
  Administered 2020-03-11 – 2020-03-13 (×3): 80 mg via ORAL
  Filled 2020-03-11 (×3): qty 2

## 2020-03-11 MED ORDER — AMIODARONE HCL 200 MG PO TABS: 400.0000 mg | ORAL_TABLET | Freq: Every day | ORAL | Status: DC

## 2020-03-11 MED ORDER — VASOPRESSIN 20 UNITS/100 ML INFUSION FOR SHOCK: 0.0000 [IU]/min | INTRAVENOUS | Status: DC

## 2020-03-11 MED ORDER — AMIODARONE LOAD VIA INFUSION
150.0000 mg | Freq: Once | INTRAVENOUS | Status: DC
  Filled 2020-03-11: qty 83.34

## 2020-03-11 MED ORDER — HYDROCORTISONE NA SUCCINATE PF 100 MG IJ SOLR
100.0000 mg | Freq: Once | INTRAMUSCULAR | Status: AC
  Administered 2020-03-11: 100 mg via INTRAVENOUS
  Filled 2020-03-11: qty 2

## 2020-03-11 MED ORDER — METFORMIN HCL 500 MG PO TABS: 1000.0000 mg | ORAL_TABLET | Freq: Two times a day (BID) | ORAL | Status: DC

## 2020-03-11 MED ORDER — PHENYLEPHRINE HCL-NACL 10-0.9 MG/250ML-% IV SOLN
25.0000 ug/min | INTRAVENOUS | Status: DC
  Administered 2020-03-11: 35 ug/min via INTRAVENOUS
  Administered 2020-03-11: 30 ug/min via INTRAVENOUS
  Administered 2020-03-12: 10 ug/min via INTRAVENOUS
  Filled 2020-03-11 (×3): qty 250

## 2020-03-11 MED ORDER — AMIODARONE LOAD VIA INFUSION: 150.0000 mg | Freq: Once | INTRAVENOUS | Status: DC

## 2020-03-11 MED ORDER — ADULT MULTIVITAMIN W/MINERALS CH
1.0000 | ORAL_TABLET | Freq: Every day | ORAL | Status: DC
  Administered 2020-03-12 – 2020-03-13 (×2): 1 via ORAL
  Filled 2020-03-11 (×3): qty 1

## 2020-03-11 MED ORDER — INSULIN ASPART 100 UNIT/ML ~~LOC~~ SOLN
0.0000 [IU] | Freq: Every day | SUBCUTANEOUS | Status: DC
  Administered 2020-03-12: 3 [IU] via SUBCUTANEOUS

## 2020-03-11 MED ORDER — MEXILETINE HCL 150 MG PO CAPS
150.0000 mg | ORAL_CAPSULE | Freq: Two times a day (BID) | ORAL | Status: DC
  Administered 2020-03-11 – 2020-03-13 (×6): 150 mg via ORAL
  Filled 2020-03-11 (×6): qty 1

## 2020-03-11 MED ORDER — BOOST / RESOURCE BREEZE PO LIQD CUSTOM: 1.0000 | Freq: Three times a day (TID) | ORAL | Status: DC

## 2020-03-11 MED ORDER — FLUCONAZOLE 200 MG PO TABS
200.0000 mg | ORAL_TABLET | Freq: Once | ORAL | Status: AC
  Administered 2020-03-11: 200 mg via ORAL
  Filled 2020-03-11 (×2): qty 1

## 2020-03-11 MED ORDER — SODIUM CHLORIDE 0.9% FLUSH
3.0000 mL | Freq: Two times a day (BID) | INTRAVENOUS | Status: DC
  Administered 2020-03-11 – 2020-03-13 (×5): 3 mL via INTRAVENOUS

## 2020-03-11 MED ORDER — FLUCONAZOLE 100 MG PO TABS
100.0000 mg | ORAL_TABLET | Freq: Every day | ORAL | Status: DC
  Administered 2020-03-12 – 2020-03-13 (×2): 100 mg via ORAL
  Filled 2020-03-11 (×2): qty 1

## 2020-03-11 MED ORDER — LACTATED RINGERS IV BOLUS
500.0000 mL | Freq: Once | INTRAVENOUS | Status: AC
  Administered 2020-03-11: 500 mL via INTRAVENOUS

## 2020-03-11 MED ORDER — SODIUM CHLORIDE 0.9 % IV SOLN: 250.0000 mL | INTRAVENOUS | Status: DC | PRN

## 2020-03-11 MED ORDER — AMIODARONE HCL IN DEXTROSE 360-4.14 MG/200ML-% IV SOLN
30.0000 mg/h | INTRAVENOUS | Status: DC
  Administered 2020-03-12 – 2020-03-13 (×4): 30 mg/h via INTRAVENOUS
  Filled 2020-03-11 (×5): qty 200

## 2020-03-11 MED ORDER — ALBUTEROL SULFATE (2.5 MG/3ML) 0.083% IN NEBU: 2.5000 mg | INHALATION_SOLUTION | Freq: Four times a day (QID) | RESPIRATORY_TRACT | Status: DC | PRN

## 2020-03-11 MED ORDER — AMIODARONE LOAD VIA INFUSION
150.0000 mg | INTRAVENOUS | Status: AC | PRN
  Administered 2020-03-11: 150 mg via INTRAVENOUS
  Filled 2020-03-11: qty 83.34

## 2020-03-11 MED ORDER — POTASSIUM CHLORIDE CRYS ER 20 MEQ PO TBCR
40.0000 meq | EXTENDED_RELEASE_TABLET | Freq: Once | ORAL | Status: AC
  Administered 2020-03-11: 40 meq via ORAL
  Filled 2020-03-11: qty 2

## 2020-03-11 MED ORDER — ALBUTEROL SULFATE HFA 108 (90 BASE) MCG/ACT IN AERS
1.0000 | INHALATION_SPRAY | Freq: Four times a day (QID) | RESPIRATORY_TRACT | Status: DC | PRN
  Filled 2020-03-11: qty 6.7

## 2020-03-11 MED ORDER — SODIUM CHLORIDE 0.9% FLUSH: 3.0000 mL | INTRAVENOUS | Status: DC | PRN

## 2020-03-11 MED ORDER — METHOTREXATE 2.5 MG PO TABS: 17.5000 mg | ORAL_TABLET | ORAL | Status: DC

## 2020-03-11 MED ORDER — VITAMIN D 25 MCG (1000 UNIT) PO TABS
1000.0000 [IU] | ORAL_TABLET | Freq: Every day | ORAL | Status: DC
  Administered 2020-03-11 – 2020-03-13 (×3): 1000 [IU] via ORAL
  Filled 2020-03-11 (×3): qty 1

## 2020-03-11 MED ORDER — GABAPENTIN 600 MG PO TABS
600.0000 mg | ORAL_TABLET | Freq: Two times a day (BID) | ORAL | Status: DC
  Administered 2020-03-11 – 2020-03-13 (×6): 600 mg via ORAL
  Filled 2020-03-11 (×6): qty 1

## 2020-03-11 MED ORDER — PREDNISONE 20 MG PO TABS
60.0000 mg | ORAL_TABLET | Freq: Every day | ORAL | Status: DC
  Administered 2020-03-12 – 2020-03-13 (×2): 60 mg via ORAL
  Filled 2020-03-11 (×2): qty 3

## 2020-03-11 MED ORDER — CHLORHEXIDINE GLUCONATE CLOTH 2 % EX PADS
6.0000 | MEDICATED_PAD | Freq: Every day | CUTANEOUS | Status: DC
  Administered 2020-03-11 – 2020-03-12 (×2): 6 via TOPICAL

## 2020-03-11 MED ORDER — FENTANYL CITRATE (PF) 100 MCG/2ML IJ SOLN
25.0000 ug | INTRAMUSCULAR | Status: DC | PRN
  Administered 2020-03-11: 25 ug via INTRAVENOUS
  Filled 2020-03-11: qty 2

## 2020-03-11 MED ORDER — MAGNESIUM SULFATE 2 GM/50ML IV SOLN
2.0000 g | Freq: Once | INTRAVENOUS | Status: AC
  Administered 2020-03-11: 2 g via INTRAVENOUS
  Filled 2020-03-11: qty 50

## 2020-03-11 MED ORDER — AMIODARONE IV BOLUS ONLY 150 MG/100ML
150.0000 mg | Freq: Once | INTRAVENOUS | Status: AC
  Administered 2020-03-11: 150 mg via INTRAVENOUS
  Filled 2020-03-11: qty 100

## 2020-03-11 MED ORDER — AMIODARONE HCL IN DEXTROSE 360-4.14 MG/200ML-% IV SOLN
60.0000 mg/h | INTRAVENOUS | Status: AC
  Administered 2020-03-11: 60 mg/h via INTRAVENOUS
  Filled 2020-03-11 (×2): qty 200

## 2020-03-11 MED ORDER — APIXABAN 5 MG PO TABS
5.0000 mg | ORAL_TABLET | Freq: Two times a day (BID) | ORAL | Status: DC
  Administered 2020-03-11 – 2020-03-13 (×6): 5 mg via ORAL
  Filled 2020-03-11 (×6): qty 1

## 2020-03-11 MED ORDER — INSULIN ASPART 100 UNIT/ML ~~LOC~~ SOLN
0.0000 [IU] | Freq: Three times a day (TID) | SUBCUTANEOUS | Status: DC
  Administered 2020-03-11: 2 [IU] via SUBCUTANEOUS
  Administered 2020-03-12: 3 [IU] via SUBCUTANEOUS

## 2020-03-11 MED ORDER — BIOTIN 10 MG PO CAPS: 10.0000 mg | ORAL_CAPSULE | Freq: Every day | ORAL | Status: DC

## 2020-03-11 MED ORDER — AMIODARONE HCL 200 MG PO TABS
400.0000 mg | ORAL_TABLET | Freq: Two times a day (BID) | ORAL | Status: DC
  Filled 2020-03-11: qty 2

## 2020-03-11 MED ORDER — ORAL CARE MOUTH RINSE
15.0000 mL | Freq: Two times a day (BID) | OROMUCOSAL | Status: DC
  Administered 2020-03-12: 15 mL via OROMUCOSAL

## 2020-03-11 MED ORDER — FOLIC ACID 1 MG PO TABS
1.0000 mg | ORAL_TABLET | Freq: Every day | ORAL | Status: DC
  Administered 2020-03-11 – 2020-03-13 (×3): 1 mg via ORAL
  Filled 2020-03-11 (×3): qty 1

## 2020-03-11 MED ORDER — HYDROCODONE-ACETAMINOPHEN 10-325 MG PO TABS
1.0000 | ORAL_TABLET | Freq: Four times a day (QID) | ORAL | Status: DC | PRN
  Administered 2020-03-12: 1 via ORAL
  Filled 2020-03-11: qty 1

## 2020-03-11 MED ORDER — ACETAMINOPHEN 325 MG PO TABS: 650.0000 mg | ORAL_TABLET | ORAL | Status: DC | PRN

## 2020-03-11 NOTE — Plan of Care (Signed)
  Problem: Clinical Measurements: Goal: Ability to maintain clinical measurements within normal limits will improve Outcome: Progressing   Problem: Pain Managment: Goal: General experience of comfort will improve Outcome: Progressing   

## 2020-03-11 NOTE — H&P (Addendum)
ELECTROPHYSIOLOGY H&P NOTE    Patient ID: Lisa Poole MRN: [id number], DOB/AGE: 55-Mar-1967 55 y.o.  Admit date: 03/11/2020 Date of Consult: 03/11/2020  Primary Physician: Milford Cage, PA Primary Cardiologist: Dr. Aundra Dubin Electrophysiologist: Dr. Lovena Le for ICD, Dr. Quentin Ore has been following her Sarcoidosis along with Dr. Aundra Dubin.   Reason for admission: ICD shock; recurrent VT  Patient Profile: Lisa Poole is a 54 y.o. female with a history of CHB s/p medtronic PPM -> upgrade to ICD in the setting of VT, Likely cardiac sarcoidosis, DM2, paroxysmal atrial fibrillation, h/o COVID-19 in 08/2019, and chronic systolic CHF / NICM who is being seen today for the evaluation of ICD shock at the request of Dr. Reather Converse.  HPI:  Lisa Poole is a 54 y.o. female with complicated history as above and is well known to the EP team due to recent admission for VT and work up for sarcoidosis.   Pt presented to [facility] this am with abdominal pain and diarrhea onset yesterday and complaints of ICD firing 3x this am.   She continues to have intermittent NSVT and VT with ATP here in the ED. She was walking to the bathroom this am when she felt severely lightheaded and was shocked x 3. (Per interrogation had failed ATP x 16 and 1 shock, then ATP x 10 and 2 shock several minutes later)  She states she has had malaise for the past few days. She had diarrhea and abdominal pain yesterday and barely ate and didn't take ANY medications, including prednisone and amiodarone.  She has also developed thrush over the past few days and had a PCP appointment today to be prescribed fluconazole.   WBC 9.9, Hgb 16.2, K 3.6, Creatinine 1.09, Mg pending.   Past Medical History:  Diagnosis Date  . Aortic insufficiency   . Asthma 09/13/2006  . Complete heart block ([place]) 02/11/2019  . Cystocele without uterine prolapse 12/12/2018  . Diabetes mellitus without complication ([place])   . Elevated troponin level   .  Hypercholesteremia   . Hypertension   . Primary osteoarthritis of left knee 12/10/2017     Surgical History:  Past Surgical History:  Procedure Laterality Date  . APPENDECTOMY    . BIOPSY  02/08/2020   Procedure: BIOPSY;  Surgeon: Juanito Doom, MD;  Location: [facility] ENDOSCOPY;  Service: Cardiopulmonary;;  . BIV ICD INSERTION CRT-D N/A 12/07/2019   Procedure: BIV ICD INSERTION CRT-D;  Surgeon: Evans Lance, MD;  Location: [place] CV LAB;  Service: Cardiovascular;  Laterality: N/A;  . BRONCHIAL WASHINGS  02/08/2020   Procedure: BRONCHIAL WASHINGS;  Surgeon: Juanito Doom, MD;  Location: [facility] ENDOSCOPY;  Service: Cardiopulmonary;;  . IABP INSERTION N/A 12/01/2019   Procedure: IABP Insertion;  Surgeon: Larey Dresser, MD;  Location: [place] [facility] LAB;  Service: Cardiovascular;  Laterality: N/A;  . LEFT HEART CATH AND CORONARY ANGIOGRAPHY N/A 09/28/2018   Procedure: LEFT HEART CATH AND CORONARY ANGIOGRAPHY;  Surgeon: Troy Sine, MD;  Location: [place] [facility] LAB;  Service: Cardiovascular;  Laterality: N/A;  . PACEMAKER IMPLANT N/A 02/13/2019   Procedure: PACEMAKER IMPLANT;  Surgeon: Evans Lance, MD;  Location: [place] [facility];  Service: Cardiovascular;  Laterality: N/A;  . PACEMAKER IMPLANT    . RIGHT HEART CATH N/A 02/09/2020   Procedure: RIGHT HEART CATH;  Surgeon: Larey Dresser, MD;  Location: [place] [facility] LAB;  Service: Cardiovascular;  Laterality: N/A;  . RIGHT/LEFT HEART CATH AND CORONARY ANGIOGRAPHY  N/A 12/01/2019   Procedure: RIGHT/LEFT HEART CATH AND CORONARY ANGIOGRAPHY;  Surgeon: Larey Dresser, MD;  Location: [place] [facility];  Service: Cardiovascular;  Laterality: N/A;  . ROTATOR CUFF REPAIR Bilateral 2017  . SINUS EXPLORATION    . TOTAL KNEE ARTHROPLASTY Left 12/10/2017   Procedure: LEFT TOTAL KNEE ARTHROPLASTY;  Surgeon: Sydnee Cabal, MD;  Location: [facility] ORS;  Service: Orthopedics;  Laterality: Left;  Adductor Block  . TUBAL LIGATION    . VIDEO  BRONCHOSCOPY N/A 02/08/2020   Procedure: VIDEO BRONCHOSCOPY WITH FLUORO;  Surgeon: Juanito Doom, MD;  Location: [place];  Service: Cardiopulmonary;  Laterality: N/A;     (Not in a hospital admission)   Inpatient Medications:  . amiodarone  150 mg Intravenous Once  . [START ON 03/12/2020] amiodarone  400 mg Oral Q12H   Followed by  . [START ON 03/26/2020] amiodarone  400 mg Oral Daily    Allergies:  Allergies  Allergen Reactions  . Lisinopril Other (See Comments) and Cough    Flu like symptoms, also   . Ace Inhibitors Other (See Comments)    Flu-like symptoms  . Augmentin [Amoxicillin-Pot Clavulanate] Nausea And Vomiting  . Benazepril Other (See Comments)    Flu like symptoms  . Oxycodone-Acetaminophen Nausea And Vomiting  . Sulfa Antibiotics Nausea And Vomiting    Social History   Socioeconomic History  . Marital status: Married    Spouse name: Not on file  . Number of children: Not on file  . Years of education: Not on file  . Highest education level: Not on file  Occupational History  . Not on file  Tobacco Use  . Smoking status: Never Smoker  . Smokeless tobacco: Never Used  Vaping Use  . Vaping Use: Never used  Substance and Sexual Activity  . Alcohol use: No  . Drug use: No  . Sexual activity: Yes    Birth control/protection: Post-menopausal  Other Topics Concern  . Not on file  Social History Narrative  . Not on file   Social Determinants of Health   Financial Resource Strain:   . Difficulty of Paying Living Expenses: Not on file  Food Insecurity:   . Worried About Charity fundraiser in the Last Year: Not on file  . Ran Out of Food in the Last Year: Not on file  Transportation Needs:   . Lack of Transportation (Medical): Not on file  . Lack of Transportation (Non-Medical): Not on file  Physical Activity:   . Days of Exercise per Week: Not on file  . Minutes of Exercise per Session: Not on file  Stress:   . Feeling of Stress : Not on  file  Social Connections:   . Frequency of Communication with Friends and Family: Not on file  . Frequency of Social Gatherings with Friends and Family: Not on file  . Attends Religious Services: Not on file  . Active Member of Clubs or Organizations: Not on file  . Attends Archivist Meetings: Not on file  . Marital Status: Not on file  Intimate Partner Violence:   . Fear of Current or Ex-Partner: Not on file  . Emotionally Abused: Not on file  . Physically Abused: Not on file  . Sexually Abused: Not on file     Family History  Problem Relation Age of Onset  . Colon cancer Maternal Grandmother   . Alzheimer's disease Mother      Review of Systems: All other systems reviewed and  are otherwise negative except as noted above.  Physical Exam: Vitals:   03/11/20 0945 03/11/20 1000 03/11/20 1015 03/11/20 1030  BP:  93/66 (!) 80/59 (!) 80/57  Pulse: (!) 53 (!) 37 (!) 102 99  Resp: _0 Temp:      TempSrc:      SpO2: 99% 97% 99% 99%  Weight:      Height:        GEN- The patient is well appearing, alert and oriented x 3 today.   HEENT: normocephalic, atraumatic; sclera clear, conjunctiva pink; hearing intact; oropharynx clear; neck supple Lungs- Clear to ausculation bilaterally, normal work of breathing.  No wheezes, rales, rhonchi Heart- Regular rate and rhythm, no murmurs, rubs or gallops GI- soft, non-tender, non-distended, bowel sounds present Extremities- no clubbing, cyanosis, or edema; DP/PT/radial pulses 2+ bilaterally MS- no significant deformity or atrophy Skin- warm and dry, no rash or lesion Psych- euthymic mood, full affect Neuro- strength and sensation are intact  Labs:   Lab Results  Component Value Date   WBC 9.9 03/11/2020   HGB 16.2 (H) 03/11/2020   HCT 51.1 (H) 03/11/2020   MCV 94.8 03/11/2020   PLT 268 03/11/2020   No results for input(s): NA, K, CL, CO2, BUN, CREATININE, CALCIUM, PROT, BILITOT, ALKPHOS, ALT, AST, GLUCOSE in the  last 168 hours.  Invalid input(s): LABALBU    Radiology/Studies: DG Chest Port 1 View  Result Date: 03/11/2020 CLINICAL DATA:  Chest pain today ,/Vtach ,,hx pacer/defib Diabetes EXAM: PORTABLE CHEST 1 VIEW COMPARISON:  None. FINDINGS: LEFT-sided pacemaker overlies normal cardiac silhouette. No effusion, infiltrate, or pneumothorax. Degenerative changes at the RIGHT shoulder. IMPRESSION: No acute cardiopulmonary process. Electronically Signed   By: Suzy Bouchard M.D.   On: 03/11/2020 10:40   CUP PACEART REMOTE DEVICE CHECK  Result Date: 03/08/2020 Scheduled remote reviewed. Normal device function.  14 NST and 2 VT, EGM's appear VT 130-140's w/ unknown duration that eventually met detection and were pace terminated. Routing to Triage Next remote 91 days.  CUP PACEART REMOTE DEVICE CHECK  Result Date: 02/16/2020 Unscheduled manual transmission received. 4 VT episodes (10/6). Longest 26 secs. Episode on 10/6 @ 1950 ATP x 2 successful. 3 other episode ATP x 1 successful. No shocks delivered. 22 NSVT episodes (10/6). Longest 3 secs. HX VT/NSVT. Follow up as scheduled. LH   EKG:  On arrival shows AS-VP at 96 bpm F/u EKGs show: AS-VP in 120s with NSVT at 0956 VT at 178 with successful ATP at 1005 VT with successful ATP at 1100   (personally reviewed)  TELEMETRY: She continues to have frequent ectopy and NSVT and VT with recurrent ATP therapies (personally reviewed)  DEVICE HISTORY: Medtronic PPM implanted 02/2019 -> upgraded to ICD 11/2019  See episodes with shock below. Multiple other episodes with ATP.     Assessment/Plan: 1.  ICD shock with recurrent VT K 3.6. This likely in the setting of acute, non-specific illness and medication non compliance in the setting of GI upset.  Re-bolus amiodarone IV Resume all therapies for sarcoidosis as below. Resume mexiletine.    2. VT 2/2 Cardiac sarcoidosis Has not been well controlled on amiodarone Mexiletine added 02/26/2020, but  amiodarone had been decreased as well to 200 mg BID (per chart) Cardiac PET at [facility] 12/2019 showed active inflammation in the heart, concerning  for cardiac sarcoidosis She is on prednisone 60 mg daily with plans for very slow taper. She is on methotrexate once a week She continues to  have VT with ATP and here in the ED. No further shocks noted.  Will discuss this combination of medications with team and if there are additional considerations we need to take given her malaise, diarrhea, and thrush.   3. CHB s/p MDT dual chamber PPM (now ICD) Stable.  4. Chronic systolic CHF due to NICM On appropriate medical regimen followed by Dr. Aundra Dubin.  K 5.2 and spironolactone stopped.   5. Thrush Will discuss dosing of fluconazole with pharmacy.   6. Goals of care She verbalizes that she is "so tired of this" She does not think she is at the point of approaching palliative care quite yet, but would like to discuss more about her projected course.   Will admit for for re-load amiodarone.   For questions or updates, please contact [place] Please consult [email or web address] for contact info under Cardiology/STEMI.  Signed, Shirley Friar, PA-C  03/11/2020 10:45 AM   Ventricular tachycardia storm  Inflammatory cardiomyopathy with abnormal PET abnormal LGE and depressed LV function  Complete heart block status post ICD-Medtronic  High Risk Medication Surveillance amiodarone and mexiletine  "So tired of this "    The patient has recurrent ventricular tachycardia storm. She has presumed sarcoid cardiomyopathy-end-stage; could also have a giant cell myocarditis potentially. Is on immunosuppressive therapy with prednisone and methotrexate and despite this is continued to have recurrent ventricular tachycardia despite adjunctive therapy with amiodarone and mexiletine.  Recurrent VT (hospitalization and and concerned about the emotional impact of recurrent shocks. Have discussed the  possibility of being sedated until VT can be controlled. (See below).  I appreciate her frustrations (to the degree possible) and have reached out to Dr. Sampson Si at [facility] whose area expertise include inflammatory cardiomyopathies for guidance. For now we'll continue her on amiodarone. We'll consider quinidine as an adjunct instead of mexiletine. Also continue prednisone and methotrexate for now.  Heart failure consultation has been obtained. Further discussions regarding sedation versus intubation and sedation for it at this point the towards the former.

## 2020-03-11 NOTE — ED Notes (Signed)
MD at bedside. 

## 2020-03-11 NOTE — Procedures (Addendum)
Ulnar arterial line placement.    Consent obtained. Procedure explained by Dr. Haroldine Laws.The right wrist was prepped and draped in the routine sterile fashion a single lumen arterial catheter was placed in the right ulnar artery using a modified Seldinger technique. Total of two attempts with Dr. Haroldine Laws placing on the third attempt.  Patient tolerated procedure. Good blood flow and wave forms. A dressing was placed.     Carlene Coria, NP  4:10 PM  Glori Bickers, MD  7:11 PM

## 2020-03-11 NOTE — Consult Note (Addendum)
Advanced Heart Failure Team Consult Note   Primary Physician: Milford Cage, PA PCP-Cardiologist:  No primary care provider on file.  Reason for Consultation: Heart Failure  HPI:    Lisa Poole is seen today for evaluation of heart failure at the request of Dr Caryl Comes.   Lisa Poole is a 54 year old with a history of h/o HTN, HLD, T2DM, CHB s/p PPM implant 02/2019  paroxysmal atrial fibrillation,  cardiac sarcoid, VT, COVID-19 infection in April 2542 and systolic heart failure/ nonischemic cardiomyopathy  In May 2020, she underwent w/u for SOB and mildly elevated troponin I (0.09>>0.10>>0.12>>0.15).   LHC 09/2018 showed no significant coronary disease.Therewas minimal systolic bridging of 5 to 10% in the mid LAD. The left circumflex vesselwas normal. The RCA is a dominant vessel with a superior takeoff andwas normal.Echo 09/2018 showed LVEF 60-65%, mild LVH, RV normal. Mild- mod AI, no aortic stenosis.  In 02/2019, she presented w/ chest pain and found to be in CHB and underwentPPMimplant (Medtr[employer]).    In April 2021, she was diagnosed w/ COVID-19 but did not require hospitalization.  [facility] complaint of progressive dyspnea, intermittent CP, orthopnea, PND and LEE. Symptoms present for several weeks. In ED, found to be in acute CHF.BNP 1,258. CXR w/ developing edema.HS troponin 32>>52>>67.EKG and tele w/ WCT, felt to be VT. Rates in the 160s-170s. Started on IV amiodarone in ED with conversion to NSR. Echowasrepeated in 7/21 and showednew drop in LVEF, down to 20-25% w/ regional wall motion abnormalties,most prominent in anterior, septal, and lateral walls. Moderate to severe TR with elevated RVSP. Moderate MR. LV thrombus excluded by definity contrast. RV systolic function normal. She developed worsening HF/ cardiogenic shock w/ low Co-ox in the 40s, required inotropic support + placement of IABP. IV amiodarone continued for VT  suppression. Developed Torsades/VT x 3 on 12/02/19 with prolonged QT required DC-CV. Amio switched to lido and later started on mexiletine. Had repeat LHC in 7/21 that showed no significant coronary disease. cMRI c/w possible cardiac sarcoidosis vs prior myocarditis. She was placed on empiric prednisone for possible sarcoid w/ plans to refer to [facility] for cardiac PET scan. Of note, chest CT 7/21 with ground glass lower lobes but no definite evidence for pulmonary sarcoidosis. She underwent device upgrade to CRT-D prior to d/c.  She went home on prednisone.   She was admitted in 8/21 with recurrent VT when prednisone was weaned down some.  Prednisone was increased and methotrexate added, home on mexiletine.   PET scan at [facility] in 8/21 was concerning for active cardiac inflammation consistent with cardiac sarcoidosis.   Back again with VT in 9/21, again with taper of prednisone.  Amiodarone was restarted and mexiletine was stopped, prednisone increased again.  Transbronchial biopsy did not show evidence for pulmonary sarcoidosis. Echo in 9/21 showed EF up some to 35-40% with normal RV.  RHC in 10/21 showed low filling pressures and preserved cardiac output.   Followed closely by Dr Aundra Dubin and was last seen 02/26/20. At that time she was taking methotrexate 15 mg with plans to increase methotrexate up to 20 -25 mg per day. Continue on prednisone 60mg  daily.  Later that day  that day she was seen by EP and was having ongoing VT. She was continue on amio 200 mg twice a day and started on mexiletine 150 mg twice a day.    Today she presented to [facility] after ICD fired x3. Yesterday felt terrible  and had abdominal pain and nausea. Says she was unable to take her medications due to nause. In the ED she continued to have NSVT/VT ATP and multiple shocks. Placed on amio drip at 60 mg per hour. Hypotensive in the ED and on arrival to the ICD.   Echo 02/07/2020 EF 35-40% RV norma Grade 1 DD  Review of Systems: [y] =  yes, [ ]  = no   . General: Weight gain [ ] ; Weight loss [ ] ; Anorexia [ ] ; Fatigue [ Y]; Fever [ ] ; Chills [ ] ; Weakness [ ]   . Cardiac: Chest pain/pressure [ ] ; Resting SOB [ ] ; Exertional SOB [ ] ; Orthopnea [ ] ; Pedal Edema [ ] ; Palpitations [Y ]; Syncope [ ] ; Presyncope [Y ]; Paroxysmal nocturnal dyspnea[ ]   . Pulmonary: Cough [ ] ; Wheezing[ ] ; Hemoptysis[ ] ; Sputum [ ] ; Snoring [ ]   . GI: Vomiting[ ] ; Dysphagia[ ] ; Melena[ ] ; Hematochezia [ ] ; Heartburn[ ] ; Abdominal pain [ ] ; Constipation [ ] ; Diarrhea [ Y]; BRBPR [ ]   . GU: Hematuria[ ] ; Dysuria [ ] ; Nocturia[ ]   . Vascular: Pain in legs with walking [ ] ; Pain in feet with lying flat [ ] ; Non-healing sores [ ] ; Stroke [ ] ; TIA [ ] ; Slurred speech [ ] ;  . Neuro: Headaches[ ] ; Vertigo[ ] ; Seizures[ ] ; Paresthesias[ ] ;Blurred vision [ ] ; Diplopia [ ] ; Vision changes [ ]   . Ortho/Skin: Arthritis [ ] ; Joint pain [Y ]; Muscle pain [ ] ; Joint swelling [ ] ; Back Pain [Y ]; Rash [ ]   . Psych: Depression[ Y]; Anxiety[ Y]  . Heme: Bleeding problems [ ] ; Clotting disorders [ ] ; Anemia [ ]   . Endocrine: Diabetes [Y ]; Thyroid dysfunction[ ]   Home Medications Prior to Admission medications   Medication Sig Start Date End Date Taking? Authorizing Provider  ACCU-CHEK AVIVA PLUS test strip 3 (three) times daily. 11/24/19  Yes [provider]  acetaminophen (TYLENOL) 325 MG tablet Take 2 tablets (650 mg total) by mouth every 4 (four) hours as needed for headache or mild pain. 12/27/19  Yes Shirley Friar, PA-C  albuterol (PROVENTIL HFA;VENTOLIN HFA) 108 (90 Base) MCG/ACT inhaler Inhale 1-2 puffs into the lungs every 6 (six) hours as needed for wheezing or shortness of breath. 07/30/18  Yes Drenda Freeze, MD  amiodarone (PACERONE) 200 MG tablet Take 200 mg by mouth 2 (two) times daily.   Yes [provider]  apixaban (ELIQUIS) 5 MG TABS tablet Take 1 tablet (5 mg total) by mouth 2 (two) times daily. 01/26/20  Yes Lyda Jester M, PA-C  Biotin 10 MG CAPS Take 10 mg by mouth daily.   Yes [provider]  carvedilol (COREG) 6.25 MG tablet Take 1 tablet (6.25 mg total) by mouth 2 (two) times daily with a meal. 02/26/20  Yes Larey Dresser, MD  Cholecalciferol (VITAMIN D-3) 25 MCG (1000 UT) CAPS Take 1,000 Units by mouth daily.    Yes [provider]  FARXIGA 10 MG TABS tablet TAKE 1 TABLET(10 MG) BY MOUTH DAILY Patient taking differently: Take 10 mg by mouth daily.  02/22/20  Yes Larey Dresser, MD  folic acid (FOLVITE) 1 MG tablet Take 1 tablet (1 mg total) by mouth daily. 12/27/19  Yes Shirley Friar, PA-C  gabapentin (NEURONTIN) 600 MG tablet Take 600 mg by mouth 2 (two) times daily.   Yes [provider]  glucose blood (COOL BLOOD GLUCOSE TEST STRIPS) test strip Use as instructed 02/09/20  Yes  Simmons, Brittainy M, PA-C  HUMALOG KWIKPEN 100 UNIT/ML KwikPen Inject 5-20 Units into the skin 3 (three) times daily. Sliding scale as directed 02/09/20  Yes [provider]  HYDROcodone-acetaminophen (NORCO) 10-325 MG tablet Take 1 tablet by mouth 4 (four) times daily.    Yes [provider]  insulin detemir (LEVEMIR) 100 UNIT/ML FlexPen Inject 7 Units into the skin daily. 02/09/20  Yes Simmons, Brittainy M, PA-C  Insulin Pen Needle (PEN NEEDLES) 32G X 4 MM MISC 1 Units by Does not apply route daily. 02/09/20  Yes Lyda Jester M, PA-C  metFORMIN (GLUCOPHAGE) 1000 MG tablet Take 1 tablet (1,000 mg total) by mouth 2 (two) times daily with a meal. 02/27/20  Yes Larey Dresser, MD  methotrexate 2.5 MG tablet Then take 15 mg (6 tabs) on  Wed 10/20.  Then take 17.5 mg (7 tabs) on Wed 10/27 and Wed 11/3.  Then take 20 mg (8 tabs) every Wed thereafter.  Caution:Chemotherapy. Protect from light. 02/26/20  Yes Larey Dresser, MD  mexiletine (MEXITIL) 150 MG capsule Take 1 capsule (150 mg total) by mouth 2 (two) times daily. 02/26/20  Yes Vickie Epley, MD   Multiple Vitamins-Minerals (CENTRUM SILVER 50+WOMEN) TABS Take 1 tablet by mouth daily.   Yes [provider]  omeprazole (PRILOSEC) 40 MG capsule Take 40 mg by mouth daily.   Yes [provider]  pravastatin (PRAVACHOL) 10 MG tablet Take 10 mg by mouth daily.   Yes [provider]  predniSONE (DELTASONE) 20 MG tablet Take 3 tablets (60 mg total) by mouth daily with breakfast. 02/10/20  Yes Rosita Fire, Brittainy M, PA-C  sacubitril-valsartan (ENTRESTO) 24-26 MG Take 1 tablet by mouth 2 (two) times daily. 02/09/20  Yes Simmons, Brittainy M, PA-C  spironolactone (ALDACTONE) 25 MG tablet TAKE 1 TABLET(25 MG) BY MOUTH DAILY 02/22/20  Yes Larey Dresser, MD  sulfamethoxazole-trimethoprim (BACTRIM DS) 800-160 MG tablet Take 1 tablet by mouth 3 (three) times a week. 02/12/20  Yes Rosita Fire, Brittainy M, PA-C  venlafaxine (EFFEXOR) 100 MG tablet Take 100 mg by mouth daily. Not sure on strength   Yes [provider]    Past Medical History: Past Medical History:  Diagnosis Date  . Aortic insufficiency   . Asthma 09/13/2006  . Complete heart block ([place]) 02/11/2019  . Cystocele without uterine prolapse 12/12/2018  . Diabetes mellitus without complication ([place])   . Elevated troponin level   . Hypercholesteremia   . Hypertension   . Primary osteoarthritis of left knee 12/10/2017    Past Surgical History: Past Surgical History:  Procedure Laterality Date  . APPENDECTOMY    . BIOPSY  02/08/2020   Procedure: BIOPSY;  Surgeon: Juanito Doom, MD;  Location: [facility] ENDOSCOPY;  Service: Cardiopulmonary;;  . BIV ICD INSERTION CRT-D N/A 12/07/2019   Procedure: BIV ICD INSERTION CRT-D;  Surgeon: Evans Lance, MD;  Location: [place] CV LAB;  Service: Cardiovascular;  Laterality: N/A;  . BRONCHIAL WASHINGS  02/08/2020   Procedure: BRONCHIAL WASHINGS;  Surgeon: Juanito Doom, MD;  Location: [facility] ENDOSCOPY;  Service: Cardiopulmonary;;  . IABP INSERTION N/A 12/01/2019    Procedure: IABP Insertion;  Surgeon: Larey Dresser, MD;  Location: [place] CV LAB;  Service: Cardiovascular;  Laterality: N/A;  . LEFT HEART CATH AND CORONARY ANGIOGRAPHY N/A 09/28/2018   Procedure: LEFT HEART CATH AND CORONARY ANGIOGRAPHY;  Surgeon: Troy Sine, MD;  Location: [place] CV LAB;  Service: Cardiovascular;  Laterality: N/A;  .  PACEMAKER IMPLANT N/A 02/13/2019   Procedure: PACEMAKER IMPLANT;  Surgeon: Evans Lance, MD;  Location: [place] CV LAB;  Service: Cardiovascular;  Laterality: N/A;  . PACEMAKER IMPLANT    . RIGHT HEART CATH N/A 02/09/2020   Procedure: RIGHT HEART CATH;  Surgeon: Larey Dresser, MD;  Location: [place] CV LAB;  Service: Cardiovascular;  Laterality: N/A;  . RIGHT/LEFT HEART CATH AND CORONARY ANGIOGRAPHY N/A 12/01/2019   Procedure: RIGHT/LEFT HEART CATH AND CORONARY ANGIOGRAPHY;  Surgeon: Larey Dresser, MD;  Location: [place] CV LAB;  Service: Cardiovascular;  Laterality: N/A;  . ROTATOR CUFF REPAIR Bilateral 2017  . SINUS EXPLORATION    . TOTAL KNEE ARTHROPLASTY Left 12/10/2017   Procedure: LEFT TOTAL KNEE ARTHROPLASTY;  Surgeon: Sydnee Cabal, MD;  Location: [facility] ORS;  Service: Orthopedics;  Laterality: Left;  Adductor Block  . TUBAL LIGATION    . VIDEO BRONCHOSCOPY N/A 02/08/2020   Procedure: VIDEO BRONCHOSCOPY WITH FLUORO;  Surgeon: Juanito Doom, MD;  Location: [place];  Service: Cardiopulmonary;  Laterality: N/A;    Family History: Family History  Problem Relation Age of Onset  . Colon cancer Maternal Grandmother   . Alzheimer's disease Mother     Social History: Social History   Socioeconomic History  . Marital status: Married    Spouse name: Not on file  . Number of children: Not on file  . Years of education: Not on file  . Highest education level: Not on file  Occupational History  . Not on file  Tobacco Use  . Smoking status: Never Smoker  . Smokeless tobacco: Never Used  Vaping Use  . Vaping  Use: Never used  Substance and Sexual Activity  . Alcohol use: No  . Drug use: No  . Sexual activity: Yes    Birth control/protection: Post-menopausal  Other Topics Concern  . Not on file  Social History Narrative  . Not on file   Social Determinants of Health   Financial Resource Strain:   . Difficulty of Paying Living Expenses: Not on file  Food Insecurity:   . Worried About Charity fundraiser in the Last Year: Not on file  . Ran Out of Food in the Last Year: Not on file  Transportation Needs:   . Lack of Transportation (Medical): Not on file  . Lack of Transportation (Non-Medical): Not on file  Physical Activity:   . Days of Exercise per Week: Not on file  . Minutes of Exercise per Session: Not on file  Stress:   . Feeling of Stress : Not on file  Social Connections:   . Frequency of Communication with Friends and Family: Not on file  . Frequency of Social Gatherings with Friends and Family: Not on file  . Attends Religious Services: Not on file  . Active Member of Clubs or Organizations: Not on file  . Attends Archivist Meetings: Not on file  . Marital Status: Not on file    Allergies:  Allergies  Allergen Reactions  . Lisinopril Other (See Comments) and Cough    Flu like symptoms, also   . Ace Inhibitors Other (See Comments)    Flu-like symptoms  . Augmentin [Amoxicillin-Pot Clavulanate] Nausea And Vomiting  . Benazepril Other (See Comments)    Flu like symptoms  . Oxycodone-Acetaminophen Nausea And Vomiting  . Sulfa Antibiotics Nausea And Vomiting    Objective:    Vital Signs:   Temp:  [98.5 F (36.9 C)] 98.5 F (36.9 C) (  11/01 0940) Pulse Rate:  [37-208] 99 (11/01 1045) Resp:  [12-23] 23 (11/01 1045) BP: (68-103)/(54-66) 68/54 (11/01 1045) SpO2:  [90 %-100 %] 99 % (11/01 1045) Weight:  [69.9 kg] 69.9 kg (11/01 0940)    Weight change: Filed Weights   03/11/20 0940  Weight: 69.9 kg    Intake/Output:  No intake or output data in  the 24 hours ending 03/11/20 1314    Physical Exam    General:  In bed.  HEENT: normal Neck: supple. JVP flat . Carotids 2+ bilat; no bruits. No lymphadenopathy or thyromegaly appreciated. Cor: PMI nondisplaced. Regular rate & rhythm. No rubs, gallops or murmurs. Lungs: clear Abdomen: soft, nontender, nondistended. No hepatosplenomegaly. No bruits or masses. Good bowel sounds. Extremities: no cyanosis, clubbing, rash, edema Neuro: alert & orientedx3, cranial nerves grossly intact. moves all 4 extremities w/o difficulty. Affect flat    Telemetry  SR with NSVt/VT.  EKG    VT 178 bpm   Labs   Basic Metabolic Panel: Recent Labs  Lab 03/11/20 1005  NA 140  K 3.6  CL 106  CO2 22  GLUCOSE 148*  BUN 28*  CREATININE 1.09*  CALCIUM 9.2    Liver Function Tests: No results for input(s): AST, ALT, ALKPHOS, BILITOT, PROT, ALBUMIN in the last 168 hours. No results for input(s): LIPASE, AMYLASE in the last 168 hours. No results for input(s): AMMONIA in the last 168 hours.  CBC: Recent Labs  Lab 03/11/20 1005  WBC 9.9  NEUTROABS 8.0*  HGB 16.2*  HCT 51.1*  MCV 94.8  PLT 268    Cardiac Enzymes: No results for input(s): CKTOTAL, CKMB, CKMBINDEX, TROPONINI in the last 168 hours.  BNP: BNP (last 3 results) Recent Labs    11/26/19 2011 02/06/20 0725  BNP 1,258.1* 344.3*    ProBNP (last 3 results) No results for input(s): PROBNP in the last 8760 hours.   CBG: No results for input(s): GLUCAP in the last 168 hours.  Coagulation Studies: No results for input(s): LABPROT, INR in the last 72 hours.   Imaging   DG Chest Port 1 View  Result Date: 03/11/2020 CLINICAL DATA:  Chest pain today ,/Vtach ,,hx pacer/defib Diabetes EXAM: PORTABLE CHEST 1 VIEW COMPARISON:  None. FINDINGS: LEFT-sided pacemaker overlies normal cardiac silhouette. No effusion, infiltrate, or pneumothorax. Degenerative changes at the RIGHT shoulder. IMPRESSION: No acute cardiopulmonary process.  Electronically Signed   By: Suzy Bouchard M.D.   On: 03/11/2020 10:40      Medications:     Current Medications: . amiodarone  150 mg Intravenous Once  . apixaban  5 mg Oral BID  . Biotin  10 mg Oral Daily  . Centrum Silver 50+Women  1 tablet Oral Daily  . [START ON 03/12/2020] fluconazole  100 mg Oral Daily  . fluconazole  200 mg Oral Once  . folic acid  1 mg Oral Daily  . gabapentin  600 mg Oral BID  . HYDROcodone-acetaminophen  1 tablet Oral QID  . hydrocortisone sodium succinate  100 mg Intravenous Once  . insulin aspart  0-5 Units Subcutaneous QHS  . insulin aspart  0-9 Units Subcutaneous TID WC  . LORazepam  1 mg Intravenous Once  . metFORMIN  1,000 mg Oral BID WC  . [START ON 03/13/2020] methotrexate  17.5 mg Oral Weekly  . mexiletine  150 mg Oral BID  . pantoprazole  80 mg Oral Daily  . pravastatin  10 mg Oral Daily  . [START ON 03/12/2020] predniSONE  60  mg Oral Q breakfast  . sodium chloride flush  3 mL Intravenous Q12H  . sulfamethoxazole-trimethoprim  1 tablet Oral Once per day on Mon Wed Fri  . Vitamin D-3  1,000 Units Oral Daily     Infusions: . sodium chloride    . amiodarone 60 mg/hr (03/11/20 1155)   Followed by  . amiodarone         Patient Profile  Lisa Poole is a 54 year old with a history of h/o HTN, HLD, T2DM, CHB s/p PPM implant 02/2019  paroxysmal atrial fibrillation,  cardiac sarcoid, VT, COVID-19 infection in April 3474 and systolic heart failure/ nonischemic cardiomyopathy.   Admitted with recurrent VT.   Assessment/Plan   1. VT Has medtronic ICD..Multiple appropriate shocks for VT.  Per EP . Started on amiodarone drip.  - Prior to admit she was taking mexiletine 150 mg twice a day but missed doses on 03/10/20 2.Cardiogenic Shock in the setting of recurrent VT.   Nonischemic cardiomyopathy. Echoin 7/21with EF newly decreased to 20-25% with septal-lateral dyssynchrony and septal severe hypokinesis, normal RV, moderate TR, moderate  MR. Echo in 5/20 prior to PPM placement showed EF 60-65%. She developed shock in 7/21 requiring pressors/inotropes and IABP.LHC/RHC 7/23/21with no significant CAD, elevated filling pressures, and low cardiac output. cMRI 7/27/21showed patchy LGE in the basal septum, basal-mid inferoseptum/inferior wall, basal anterior wall, mid anterolateral wall. Cardiac MRI LGE pattern could be consistent with cardiac sarcoidosis (would explain earlier CHB). Alternatively, prior viral myocarditis (recent COVID-19). CT chest with ground glass lower lobes but no evidence for pulmonary sarcoidosis, transbronchial biopsy did not show granulomatous disease. She is now s/p device upgrade to MDT CRT-D.Cardiac PET in 8/21 was showed active inflammation in the heart, concerning again for cardiac sarcoidosis. Most recent echo in 9/21 with EF up to 35-40%. - Hypotensive ---> Starting on Neo to support BP.  - Volume status stable.  - Holding entresto, bb with shock.  3. Cardiac Sarcoid Cardiac MRI and cardiac PET both suggestive of cardiac sarcoidosis, and unexplained CHB also concerning, as is VT.  She has had VT with prednisone weaning.  Suspect isolated cardiac sarcoidosis.  Transbronchial biopsy did not show granulomatous disease. - She has been on methotrexate 15 mg weekly + prednisone 60 mg prednisone.   - PJP --> bactrim + folate.  4. H/O CHB  5. DMII Will need SSI.  6. H/O DVT  Length of Stay: 0  Darrick Grinder, NP  03/11/2020, 1:14 PM  Advanced Heart Failure Team Pager [phone] (M-F; 7a - 4p)  Please contact [facility] for night-coverage after hours (4p -7a ) and weekends on [email or web address]   Agree with above.   54 y/o female with presumed cardiac sarcoid readmitted with recurrent VT and several ICD shocks despite ongoing therapy with prednisone/MTX and dual anti-arrhythmic therapy. transbronchial lung bx negative for sarcoid. PET scan showed significant cardiac hypermetabolism that has not changed  significantly from pre-treatment. There was no extra-cardiac activity noted.   On arrival today. Patient was also hypotensive with pressures in the 60s. Moved to ICU. IV amio and neosynephrine started.   HF volume status appears stable.   General:  Lying in bed. Fatigued/anxious No respiratory difficulty HEENT: normal Neck: supple. no JVD. Carotids 2+ bilat; no bruits. No lymphadenopathy or thryomegaly appreciated. Cor: PMI nondisplaced. Regular rate & rhythm. No rubs, gallops or murmurs. Lungs: clear Abdomen: soft, nontender, nondistended. No hepatosplenomegaly. No bruits or masses. Good bowel sounds. Extremities: no cyanosis, clubbing, rash, edema Neuro: alert &  orientedx3, cranial nerves grossly intact. moves all 4 extremities w/o difficulty. Affect pleasant  Recurrent VT storm associated with hypotension in patient with presumed cardiac sarcoidosis. I have d/w Dr. Caryl Comes and question now arises about whether sarcoid is the correct diagnosis or are we perhaps dealing with another entity like giant-cell myocarditis. I have d/w Dr. Aundra Dubin and we agree that endomyocardial biopsy is likely indicated to further guide therapy. He will d/w the [facility] team tonight. For now continue to support with neo and IV amio + oral mexilitene. If VT again becomes refractory can intubate and sedate to avoid further suffering. I have written for low-dose versed and fentanyl. Arterial line placed for closer hemodynamic monitoring.   CRITICAL CARE Performed by: Glori Bickers  Total critical care time: 60 minutes  Critical care time was exclusive of separately billable procedures and treating other patients.  Critical care was necessary to treat or prevent imminent or life-threatening deterioration.  Critical care was time spent personally by me (independent of midlevel providers or residents) on the following activities: development of treatment plan with patient and/or surrogate as well as nursing,  discussions with consultants, evaluation of patient's response to treatment, examination of patient, obtaining history from patient or surrogate, ordering and performing treatments and interventions, ordering and review of laboratory studies, ordering and review of radiographic studies, pulse oximetry and re-evaluation of patient's condition.  Glori Bickers, MD  7:24 PM

## 2020-03-11 NOTE — Discharge Summary (Signed)
Physician Discharge Summary  Patient ID: Lisa Poole MRN: [id number] DOB/AGE: 1965/12/25 54 y.o.  Admit date: 03/11/2020 Discharge date: 03/11/2020  Admission Diagnoses: VT  Discharge Diagnoses:  Active Problems:   Chronic systolic heart failure [facility])   Defibrillator discharge   Cardiac sarcoidosis   Ventricular tachyarrhythmia [facility])   Cardiogenic shock [facility])   Hospital Course:   Lisa Poole is a 54 year old with a history of h/o HTN, HLD, T2DM, CHB s/p PPM implant 02/2019 paroxysmal atrial fibrillation, cardiac sarcoid, VT, COVID-19infection in April 1027 and systolic heart failure/ nonischemic cardiomyopathy  In May 2020, she underwent w/u for SOB and mildly elevated troponin I (0.09>>0.10>>0.12>>0.15). LHC 09/2018 showed no significant coronary disease.Therewas minimal systolic bridging of 5 to 10% in the mid LAD. The left circumflex vesselwas normal. The RCA is a dominant vessel with a superior takeoff andwas normal.Echo 5/2020showedLVEF 60-65%, mild LVH, RV normal. Mild- mod AI, noaorticstenosis.  In 02/2019, she presented w/ chest pain and found to be in CHB and underwentPPMimplant ([employer]).   In April 2021, she was diagnosed w/ COVID-19but did not require hospitalization.  [facility]/ complaint of progressive dyspnea, intermittent CP, orthopnea, PND and LEE. Symptoms present for several weeks.In ED, found to be in acute CHF.BNP 1,258. CXR w/ developing edema.HS troponin 32>>52>>67.EKG and tele w/ WCT, felt to be VT. Rates in the 160s-170s. Started on IV amiodarone in [place] conversion to NSR.Echowasrepeatedin 7/21 andshowednew drop in LVEF, down to 20-25% w/ regional wall motion abnormalties,most prominent in anterior, septal, and lateral walls. Moderate to severe TR with elevated RVSP. Moderate MR. LV thrombus excluded by definity contrast. RV systolic function normal.She developed worsening HF/ cardiogenic shock  w/ low Co-ox in the 40s, required inotropic support + placement of IABP. IV amiodaronecontinuedfor VT suppression. Developed Torsades/VT x 3 on 7/24/21with prolonged QT required DC-CV. Amio switched to lido and later started on mexiletine.Had repeat LHC in 7/21that showed no significant coronary disease. cMRI c/w possible cardiac sarcoidosis vs prior myocarditis. She was placed on empiric prednisone for possible sarcoid w/ plans to refer to [facility] for cardiac PET scan. Of note, chest CT 7/21 with ground glass lower lobes but nodefiniteevidence for pulmonary sarcoidosis. She underwent device upgradeto CRT-D prior to d/c. She went home on prednisone.   She was admitted in 8/21 with recurrent VT when prednisone was weaned down some. Prednisone was increased and methotrexate added, home on mexiletine.   [facility] scan at [facility] in 8/21 was concerning for active cardiac inflammation consistent with cardiac sarcoidosis.   Back again with VT in 9/21, again with taper of prednisone. Amiodarone was restarted and mexiletine was stopped, prednisone increased again. Transbronchial biopsy did not show evidence for pulmonary sarcoidosis. Echo in 9/21 showed EF up some to 35-40% with normal RV. RHC in 10/21 showed low filling pressures and preserved cardiac output.   Followed closely by Dr Aundra Dubin and was last seen 02/26/20. At that time she was taking methotrexate 15 mg with plans to increase methotrexate up to 20 -25 mg per day. Continue on prednisone 60mg  daily.  Later that day  that day she was seen by EP and was having ongoing VT. She was continue on amio 200 mg twice a day and started on mexiletine 150 mg twice a day.    Today she presented to [facility] after ICD fired x 3. Yesterday felt terrible and had abdominal pain and nausea. Says she was unable to take her medications due to nausea. In the ED, she  continued to have NSVT/VT ATP and multiple shocks. Placed on amio drip at 60 mg per hour. Hypotensive in the ED  and on arrival to the ICU after sedated with Versed. We elected not to intubate her as ectopy is seems to be resolving with amiodarone gtt.   Echo 02/07/2020 EF 35-40% RV normal Grade 1 DD  Recurrent VT storm associated with hypotension in patient with presumed cardiac sarcoidosis. We have discussed with Dr. Caryl Comes who has consulted with [facility] and question now arises about whether sarcoid is the correct diagnosis or are we perhaps dealing with another entity like giant-cell myocarditis. On discussion amongst Drs Caryl Comes, Aundra Dubin, and Bensimhon, we have decided that  endomyocardial biopsy is likely indicated to further guide therapy. We talked with the [facility] team tonight. For now continue to support with low dose phenylephrine and IV amio + oral mexilitene.  She is written for low-dose versed and fentanyl. Arterial line placed for closer hemodynamic monitoring.    Plan for transfer to [facility] hopefully tonight when bed is available  Consults: EP, HF cardiology   Discharge Exam: Blood pressure 92/67, pulse 73, temperature 98.5 F (36.9 C), temperature source Oral, resp. rate 12, height 5' (1.524 m), weight 69.9 kg, last menstrual period 07/25/2015, SpO2 95 %. General: NAD but drowsy Neck: No JVD, no thyromegaly or thyroid nodule.  Lungs: Clear to auscultation bilaterally with normal respiratory effort. CV: Nondisplaced PMI.  Heart regular S1/S2, no S3/S4, no murmur.  No peripheral edema.  No carotid bruit.  Normal pedal pulses.  Abdomen: Soft, nontender, no hepatosplenomegaly, no distention.  Skin: Intact without lesions or rashes.  Neurologic: Alert and oriented x 3.  Psych: Normal affect. Extremities: No clubbing or cyanosis.  HEENT: Normal.   Disposition: Stable.   Scheduled Meds: . amiodarone  150 mg Intravenous Once  . apixaban  5 mg Oral BID  . Chlorhexidine Gluconate Cloth  6 each Topical Daily  . cholecalciferol  1,000 Units Oral Daily  . feeding supplement  1 Container Oral  TID BM  . [START ON 03/12/2020] fluconazole  100 mg Oral Daily  . folic acid  1 mg Oral Daily  . gabapentin  600 mg Oral BID  . insulin aspart  0-5 Units Subcutaneous QHS  . insulin aspart  0-9 Units Subcutaneous TID WC  . mouth rinse  15 mL Mouth Rinse BID  . mexiletine  150 mg Oral BID  . multivitamin with minerals  1 tablet Oral Daily  . pantoprazole  80 mg Oral Daily  . pravastatin  10 mg Oral Daily  . [START ON 03/12/2020] predniSONE  60 mg Oral Q breakfast  . sodium chloride flush  3 mL Intravenous Q12H  . sulfamethoxazole-trimethoprim  1 tablet Oral Once per day on Mon Wed Fri   Continuous Infusions: . sodium chloride    . sodium chloride Stopped (03/11/20 1854)  . amiodarone 30 mg/hr (03/11/20 1900)  . phenylephrine (NEO-SYNEPHRINE) Adult infusion 30 mcg/min (03/11/20 1900)   PRN Meds:.sodium chloride, acetaminophen, albuterol, fentaNYL (SUBLIMAZE) injection, HYDROcodone-acetaminophen, midazolam, ondansetron (ZOFRAN) IV, sodium chloride flush  Signed: Loralie Champagne 03/11/2020, 9:00 PM

## 2020-03-11 NOTE — ED Provider Notes (Signed)
[facility] EMERGENCY DEPARTMENT Provider Note   CSN: [id number] Arrival date & time: 03/11/20  8185     History No chief complaint on file.   Lisa Poole is a 54 y.o. female.  The history is provided by the patient.  Chest Pain Pain location:  L chest Pain radiates to:  Does not radiate Pain severity:  Moderate Onset quality:  Sudden Duration:  2 hours Timing:  Constant Chronicity:  New Context: at rest   Relieved by:  None tried Associated symptoms: abdominal pain and dizziness   Associated symptoms: no cough, no fever, no headache, no nausea, no shortness of breath and no vomiting        Past Medical History:  Diagnosis Date  . Aortic insufficiency   . Asthma 09/13/2006  . Complete heart block ([place]) 02/11/2019  . Cystocele without uterine prolapse 12/12/2018  . Diabetes mellitus without complication ([place])   . Elevated troponin level   . Hypercholesteremia   . Hypertension   . Primary osteoarthritis of left knee 12/10/2017    Patient Active Problem List   Diagnosis Date Noted  . Ventricular tachyarrhythmia ([place]) 03/11/2020  . Cardiac sarcoidosis 02/13/2020  . Acute kidney injury (nontraumatic) ([place])   . Defibrillator discharge   . Acute left flank pain 01/11/2020  . Thrush 01/11/2020  . ICD (implantable cardioverter-defibrillator) in place 01/09/2020  . Ventricular tachycardia ([place]) 12/19/2019  . Wound infection after surgery 12/19/2019  . Chronic systolic heart failure ([place])   . Shortness of breath   . Acute decompensated heart failure ([place]) 11/26/2019  . Uterovaginal prolapse, incomplete 02/15/2019  . Complete heart block ([place]) 02/11/2019  . Cystocele without uterine prolapse 12/12/2018  . Elevated troponin level   . Chest discomfort   . NSTEMI (non-ST elevated myocardial infarction) ([place]) 09/27/2018  . Primary osteoarthritis of left knee 12/10/2017  . S/P knee replacement 12/10/2017  . Pain in left knee 10/20/2017  . Tear of right  rotator cuff 05/20/2017  . Acromioclavicular arthrosis 08/01/2013  . Aortic insufficiency   . Dry eye syndrome 11/25/2007  . Asthma 09/13/2006  . Diabetes mellitus ([place]) 09/13/2006  . HTN (hypertension) 09/13/2006    Past Surgical History:  Procedure Laterality Date  . APPENDECTOMY    . BIOPSY  02/08/2020   Procedure: BIOPSY;  Surgeon: Juanito Doom, MD;  Location: [facility]COPY;  Service: Cardiopulmonary;;  . BIV ICD INSERTION CRT-D N/A 12/07/2019   Procedure: BIV ICD INSERTION CRT-D;  Surgeon: Evans Lance, MD;  Location: [place] CV LAB;  Service: Cardiovascular;  Laterality: N/A;  . BRONCHIAL WASHINGS  02/08/2020   Procedure: BRONCHIAL WASHINGS;  Surgeon: Juanito Doom, MD;  Location: [facility] ENDOSCOPY;  Service: Cardiopulmonary;;  . IABP INSERTION N/A 12/01/2019   Procedure: IABP Insertion;  Surgeon: Larey Dresser, MD;  Location: [place] CV LAB;  Service: Cardiovascular;  Laterality: N/A;  . LEFT HEART CATH AND CORONARY ANGIOGRAPHY N/A 09/28/2018   Procedure: LEFT HEART CATH AND CORONARY ANGIOGRAPHY;  Surgeon: Troy Sine, MD;  Location: [place] [facility] LAB;  Service: Cardiovascular;  Laterality: N/A;  . PACEMAKER IMPLANT N/A 02/13/2019   Procedure: PACEMAKER IMPLANT;  Surgeon: Evans Lance, MD;  Location: [place] [facility] LAB;  Service: Cardiovascular;  Laterality: N/A;  . PACEMAKER IMPLANT    . RIGHT HEART CATH N/A 02/09/2020   Procedure: RIGHT HEART CATH;  Surgeon: Larey Dresser, MD;  Location: [place] CV LAB;  Service: Cardiovascular;  Laterality: N/A;  .  RIGHT/LEFT HEART CATH AND CORONARY ANGIOGRAPHY N/A 12/01/2019   Procedure: RIGHT/LEFT HEART CATH AND CORONARY ANGIOGRAPHY;  Surgeon: Larey Dresser, MD;  Location: [place] CV LAB;  Service: Cardiovascular;  Laterality: N/A;  . ROTATOR CUFF REPAIR Bilateral 2017  . SINUS EXPLORATION    . TOTAL KNEE ARTHROPLASTY Left 12/10/2017   Procedure: LEFT TOTAL KNEE ARTHROPLASTY;  Surgeon: Sydnee Cabal, MD;   Location: [facility] ORS;  Service: Orthopedics;  Laterality: Left;  Adductor Block  . TUBAL LIGATION    . VIDEO BRONCHOSCOPY N/A 02/08/2020   Procedure: VIDEO BRONCHOSCOPY WITH FLUORO;  Surgeon: Juanito Doom, MD;  Location: [place];  Service: Cardiopulmonary;  Laterality: N/A;     OB History    Gravida  5   Para  4   Term  1   Preterm  3   AB  1   Living  4     SAB  1   TAB  0   Ectopic  0   Multiple  0   Live Births  4           Family History  Problem Relation Age of Onset  . Colon cancer Maternal Grandmother   . Alzheimer's disease Mother     Social History   Tobacco Use  . Smoking status: Never Smoker  . Smokeless tobacco: Never Used  Vaping Use  . Vaping Use: Never used  Substance Use Topics  . Alcohol use: No  . Drug use: No    Home Medications Prior to Admission medications   Medication Sig Start Date End Date Taking? Authorizing Provider  ACCU-CHEK AVIVA PLUS test strip 3 (three) times daily. 11/24/19  Yes [provider]  acetaminophen (TYLENOL) 325 MG tablet Take 2 tablets (650 mg total) by mouth every 4 (four) hours as needed for headache or mild pain. 12/27/19  Yes Shirley Friar, PA-C  albuterol (PROVENTIL HFA;VENTOLIN HFA) 108 (90 Base) MCG/ACT inhaler Inhale 1-2 puffs into the lungs every 6 (six) hours as needed for wheezing or shortness of breath. 07/30/18  Yes Drenda Freeze, MD  amiodarone (PACERONE) 200 MG tablet Take 200 mg by mouth 2 (two) times daily.   Yes [provider]  apixaban (ELIQUIS) 5 MG TABS tablet Take 1 tablet (5 mg total) by mouth 2 (two) times daily. 01/26/20  Yes Lyda Jester M, PA-C  Biotin 10 MG CAPS Take 10 mg by mouth daily.   Yes [provider]  carvedilol (COREG) 6.25 MG tablet Take 1 tablet (6.25 mg total) by mouth 2 (two) times daily with a meal. 02/26/20  Yes Larey Dresser, MD  Cholecalciferol (VITAMIN D-3) 25 MCG (1000 UT) CAPS Take 1,000 Units by mouth  daily.    Yes [provider]  FARXIGA 10 MG TABS tablet TAKE 1 TABLET(10 MG) BY MOUTH DAILY Patient taking differently: Take 10 mg by mouth daily.  02/22/20  Yes Larey Dresser, MD  folic acid (FOLVITE) 1 MG tablet Take 1 tablet (1 mg total) by mouth daily. 12/27/19  Yes Shirley Friar, PA-C  gabapentin (NEURONTIN) 600 MG tablet Take 600 mg by mouth 2 (two) times daily.   Yes [provider]  glucose blood (COOL BLOOD GLUCOSE TEST STRIPS) test strip Use as instructed 02/09/20  Yes Simmons, Brittainy M, PA-C  HUMALOG KWIKPEN 100 UNIT/ML KwikPen Inject 5-20 Units into the skin 3 (three) times daily. Sliding scale as directed 02/09/20  Yes [provider]  HYDROcodone-acetaminophen (NORCO) 10-325 MG tablet  Take 1 tablet by mouth 4 (four) times daily.    Yes [provider]  insulin detemir (LEVEMIR) 100 UNIT/ML FlexPen Inject 7 Units into the skin daily. 02/09/20  Yes Simmons, Brittainy M, PA-C  Insulin Pen Needle (PEN NEEDLES) 32G X 4 MM MISC 1 Units by Does not apply route daily. 02/09/20  Yes Lyda Jester M, PA-C  metFORMIN (GLUCOPHAGE) 1000 MG tablet Take 1 tablet (1,000 mg total) by mouth 2 (two) times daily with a meal. 02/27/20  Yes Larey Dresser, MD  methotrexate 2.5 MG tablet Then take 15 mg (6 tabs) on  Wed 10/20.  Then take 17.5 mg (7 tabs) on Wed 10/27 and Wed 11/3.  Then take 20 mg (8 tabs) every Wed thereafter.  Caution:Chemotherapy. Protect from light. 02/26/20  Yes Larey Dresser, MD  mexiletine (MEXITIL) 150 MG capsule Take 1 capsule (150 mg total) by mouth 2 (two) times daily. 02/26/20  Yes Vickie Epley, MD  Multiple Vitamins-Minerals (CENTRUM SILVER 50+WOMEN) TABS Take 1 tablet by mouth daily.   Yes [provider]  omeprazole (PRILOSEC) 40 MG capsule Take 40 mg by mouth daily.   Yes [provider]  pravastatin (PRAVACHOL) 10 MG tablet Take 10 mg by mouth daily.   Yes [provider]  predniSONE  (DELTASONE) 20 MG tablet Take 3 tablets (60 mg total) by mouth daily with breakfast. 02/10/20  Yes Rosita Fire, Brittainy M, PA-C  sacubitril-valsartan (ENTRESTO) 49-51 MG Take 1 tablet by mouth 2 (two) times daily.   Yes [provider]  sulfamethoxazole-trimethoprim (BACTRIM DS) 800-160 MG tablet Take 1 tablet by mouth 3 (three) times a week. 02/12/20  Yes Rosita Fire, Brittainy M, PA-C  venlafaxine XR (EFFEXOR-XR) 150 MG 24 hr capsule Take 150 mg by mouth daily with breakfast.   Yes [provider]  sacubitril-valsartan (ENTRESTO) 24-26 MG Take 1 tablet by mouth 2 (two) times daily. Patient not taking: Reported on 03/11/2020 02/09/20   Lyda Jester M, PA-C  spironolactone (ALDACTONE) 25 MG tablet TAKE 1 TABLET(25 MG) BY MOUTH DAILY Patient not taking: Reported on 03/11/2020 02/22/20   Larey Dresser, MD    Allergies    Lisinopril, Ace inhibitors, Augmentin [amoxicillin-pot clavulanate], Benazepril, Oxycodone-acetaminophen, and Sulfa antibiotics  Review of Systems   Review of Systems  Constitutional: Negative for chills and fever.  HENT: Negative for congestion and sore throat.   Respiratory: Negative for cough and shortness of breath.   Cardiovascular: Positive for chest pain.  Gastrointestinal: Positive for abdominal pain and diarrhea. Negative for nausea and vomiting.  Genitourinary: Negative for difficulty urinating.  Skin: Negative for color change and rash.  Neurological: Positive for dizziness. Negative for headaches.  All other systems reviewed and are negative.   Physical Exam Updated Vital Signs BP (!) 68/54   Pulse 99   Temp 98.5 F (36.9 C) (Oral)   Resp (!) 23   Ht 5' (1.524 m)   Wt 69.9 kg   LMP 07/25/2015 (Exact Date)   SpO2 99%   BMI 30.08 kg/m   Physical Exam Vitals reviewed.  Constitutional:      Appearance: Normal appearance.  HENT:     Head: Normocephalic and atraumatic.     Nose: Nose normal.     Mouth/Throat:     Mouth: Mucous  membranes are moist.     Comments: White patches on tongue Eyes:     Conjunctiva/sclera: Conjunctivae normal.  Cardiovascular:     Heart sounds: Normal heart sounds.  Pulmonary:  Effort: Pulmonary effort is normal.     Breath sounds: Normal breath sounds.  Abdominal:     General: Abdomen is flat.     Palpations: Abdomen is soft.     Tenderness: There is no abdominal tenderness.  Musculoskeletal:     Cervical back: Neck supple.     Right lower leg: No edema.     Left lower leg: No edema.  Skin:    General: Skin is warm and dry.  Neurological:     General: No focal deficit present.     Mental Status: She is alert and oriented to person, place, and time.  Psychiatric:        Mood and Affect: Mood normal.        Behavior: Behavior normal.     ED Results / Procedures / Treatments   Labs (all labs ordered are listed, but only abnormal results are displayed) Labs Reviewed  CBC WITH DIFFERENTIAL/PLATELET - Abnormal; Notable for the following components:      Result Value   RBC 5.39 (*)    Hemoglobin 16.2 (*)    HCT 51.1 (*)    RDW 22.9 (*)    nRBC 0.3 (*)    Neutro Abs 8.0 (*)    Abs Immature Granulocytes 0.11 (*)    All other components within normal limits  BASIC METABOLIC PANEL - Abnormal; Notable for the following components:   Glucose, Bld 148 (*)    BUN 28 (*)    Creatinine, Ser 1.09 (*)    GFR, Estimated 60 (*)    All other components within normal limits  MAGNESIUM    EKG None  Radiology DG Chest Port 1 View  Result Date: 03/11/2020 CLINICAL DATA:  Chest pain today ,Tanna Furry ,,hx pacer/defib Diabetes EXAM: PORTABLE CHEST 1 VIEW COMPARISON:  None. FINDINGS: LEFT-sided pacemaker overlies normal cardiac silhouette. No effusion, infiltrate, or pneumothorax. Degenerative changes at the RIGHT shoulder. IMPRESSION: No acute cardiopulmonary process. Electronically Signed   By: Suzy Bouchard M.D.   On: 03/11/2020 10:40    Procedures Procedures (including  critical care time)  Medications Ordered in ED Medications  amiodarone (NEXTERONE) 1.8 mg/mL load via infusion 150 mg (150 mg Intravenous Not Given 03/11/20 1153)    Followed by  amiodarone (NEXTERONE PREMIX) 360-4.14 MG/200ML-% (1.8 mg/mL) IV infusion (60 mg/hr Intravenous New Bag/Given 03/11/20 1155)    Followed by  amiodarone (NEXTERONE PREMIX) 360-4.14 MG/200ML-% (1.8 mg/mL) IV infusion (has no administration in time range)  acetaminophen (TYLENOL) tablet 650 mg (has no administration in time range)  HYDROcodone-acetaminophen (NORCO) 10-325 MG per tablet 1 tablet (has no administration in time range)  sulfamethoxazole-trimethoprim (BACTRIM DS) 800-160 MG per tablet 1 tablet (has no administration in time range)  mexiletine (MEXITIL) capsule 150 mg (has no administration in time range)  pravastatin (PRAVACHOL) tablet 10 mg (has no administration in time range)  metFORMIN (GLUCOPHAGE) tablet 1,000 mg (has no administration in time range)  predniSONE (DELTASONE) tablet 60 mg (has no administration in time range)  pantoprazole (PROTONIX) EC tablet 80 mg (has no administration in time range)  folic acid (FOLVITE) tablet 1 mg (has no administration in time range)  apixaban (ELIQUIS) tablet 5 mg (has no administration in time range)  gabapentin (NEURONTIN) tablet 600 mg (has no administration in time range)  albuterol (VENTOLIN HFA) 108 (90 Base) MCG/ACT inhaler 1-2 puff (has no administration in time range)  Centrum Silver 50+Women TABS 1 tablet (has no administration in time range)  Vitamin D-3 CAPS 1,000  Units (has no administration in time range)  Biotin CAPS 10 mg (has no administration in time range)  sodium chloride flush (NS) 0.9 % injection 3 mL (has no administration in time range)  sodium chloride flush (NS) 0.9 % injection 3 mL (has no administration in time range)  0.9 %  sodium chloride infusion (has no administration in time range)  ondansetron (ZOFRAN) injection 4 mg (has no  administration in time range)  methotrexate (RHEUMATREX) tablet 17.5 mg (has no administration in time range)  amiodarone (NEXTERONE) 1.8 mg/mL load via infusion 150 mg (has no administration in time range)  insulin aspart (novoLOG) injection 0-9 Units (has no administration in time range)  insulin aspart (novoLOG) injection 0-5 Units (has no administration in time range)  fluconazole (DIFLUCAN) tablet 200 mg (has no administration in time range)  fluconazole (DIFLUCAN) tablet 100 mg (has no administration in time range)  lactated ringers bolus 500 mL (500 mLs Intravenous Other (enter comment in med admin window) 03/11/20 1045)  amiodarone (NEXTERONE) IV bolus only 150 mg/100 mL (0 mg Intravenous Stopped 03/11/20 1138)  potassium chloride SA (KLOR-CON) CR tablet 40 mEq (40 mEq Oral Given 03/11/20 1144)  LORazepam (ATIVAN) injection 1 mg (1 mg Intravenous Given 03/11/20 1324)  hydrocortisone sodium succinate (SOLU-CORTEF) 100 MG injection 100 mg (100 mg Intravenous Given 03/11/20 1324)    ED Course  I have reviewed the triage vital signs and the nursing notes.  Pertinent labs & imaging results that were available during my care of the patient were reviewed by me and considered in my medical decision making (see chart for details).    MDM Rules/Calculators/A&P                          Medical Decision Making: ITXEL WICKARD is a 54 y.o. female who presented to the ED today with CP and dizziness after 3 shocks from pacemaker. Pt reports 2 days diarrhea and cramping abdominal pain. Pt given ASA prior to arrival.   Past medical history significant for HTN, HLD, T2DM,  CHB s/p PPM implant 02/2019 followed by Dr. Lovena Le, paroxysmal atrial fibrillation,  COVID-19 infection in April 7564 and systolic heart failure/ nonischemic cardiomyopathy (suspect cardiac sarcoid) 02/07/20- EF 35 to 40% 09/2018- LHC  showed no significant coronary disease.  EP: Lars Mage MD  PCP- HF: Loralie Champagne  MD Reviewed and confirmed nursing documentation for past medical history, family history, social history. On my initial exam, the pt was in NAD, hemodynamically stable, HR 50, BP 103/66, normal WOB, awake and alert, soft non tender abdomen  Medtronic pacemaker interrogated, reports 3 shocks for Vt/VF, 1 failed. 29 treated VT/VF episodes longer than 30 sec.   Consulted: Cardiology  Notified by nursing of pt having recurrent episodes of V tach.  BP down trending 80/57  Telemtry shows continued ectopy.  Pt given 500 cc LR bolus for hypotension. Amiodarone bolus plus infusion ordered.  BP persistently soft, 68/54, pt on chronic steroids, given stress dose steroid bolus.   All radiology and laboratory studies reviewed independently and with my attending physician, agree with reading provided by radiologist unless otherwise noted.  Upon reassessing patient, patient was uncomfortable appearing, diaphoretic, mild increased WOB, but awake and alert.   Based on the above findings, I believe patient requires admission.     The above care was discussed with and agreed upon by my attending physician.  Emergency Department Medication Summary:  Medications  amiodarone (NEXTERONE) 1.8  mg/mL load via infusion 150 mg (150 mg Intravenous Not Given 03/11/20 1153)    Followed by  amiodarone (NEXTERONE PREMIX) 360-4.14 MG/200ML-% (1.8 mg/mL) IV infusion (60 mg/hr Intravenous New Bag/Given 03/11/20 1155)    Followed by  amiodarone (NEXTERONE PREMIX) 360-4.14 MG/200ML-% (1.8 mg/mL) IV infusion (has no administration in time range)  acetaminophen (TYLENOL) tablet 650 mg (has no administration in time range)  HYDROcodone-acetaminophen (NORCO) 10-325 MG per tablet 1 tablet (has no administration in time range)  sulfamethoxazole-trimethoprim (BACTRIM DS) 800-160 MG per tablet 1 tablet (has no administration in time range)  mexiletine (MEXITIL) capsule 150 mg (has no administration in time range)  pravastatin  (PRAVACHOL) tablet 10 mg (has no administration in time range)  metFORMIN (GLUCOPHAGE) tablet 1,000 mg (has no administration in time range)  predniSONE (DELTASONE) tablet 60 mg (has no administration in time range)  pantoprazole (PROTONIX) EC tablet 80 mg (has no administration in time range)  folic acid (FOLVITE) tablet 1 mg (has no administration in time range)  apixaban (ELIQUIS) tablet 5 mg (has no administration in time range)  gabapentin (NEURONTIN) tablet 600 mg (has no administration in time range)  albuterol (VENTOLIN HFA) 108 (90 Base) MCG/ACT inhaler 1-2 puff (has no administration in time range)  Centrum Silver 50+Women TABS 1 tablet (has no administration in time range)  Vitamin D-3 CAPS 1,000 Units (has no administration in time range)  Biotin CAPS 10 mg (has no administration in time range)  sodium chloride flush (NS) 0.9 % injection 3 mL (has no administration in time range)  sodium chloride flush (NS) 0.9 % injection 3 mL (has no administration in time range)  0.9 %  sodium chloride infusion (has no administration in time range)  ondansetron (ZOFRAN) injection 4 mg (has no administration in time range)  methotrexate (RHEUMATREX) tablet 17.5 mg (has no administration in time range)  amiodarone (NEXTERONE) 1.8 mg/mL load via infusion 150 mg (has no administration in time range)  insulin aspart (novoLOG) injection 0-9 Units (has no administration in time range)  insulin aspart (novoLOG) injection 0-5 Units (has no administration in time range)  fluconazole (DIFLUCAN) tablet 200 mg (has no administration in time range)  fluconazole (DIFLUCAN) tablet 100 mg (has no administration in time range)  lactated ringers bolus 500 mL (500 mLs Intravenous Other (enter comment in med admin window) 03/11/20 1045)  amiodarone (NEXTERONE) IV bolus only 150 mg/100 mL (0 mg Intravenous Stopped 03/11/20 1138)  potassium chloride SA (KLOR-CON) CR tablet 40 mEq (40 mEq Oral Given 03/11/20 1144)   LORazepam (ATIVAN) injection 1 mg (1 mg Intravenous Given 03/11/20 1324)  hydrocortisone sodium succinate (SOLU-CORTEF) 100 MG injection 100 mg (100 mg Intravenous Given 03/11/20 1324)       Final Clinical Impression(s) / ED Diagnoses Final diagnoses:  Chest pain    Rx / DC Orders ED Discharge Orders    None       Roosevelt Locks, MD 03/11/20 1326    Elnora Morrison, MD 03/13/20 1349

## 2020-03-11 NOTE — Progress Notes (Addendum)
Amiodarone Drug - Drug Interaction Consult Note  Recommendations: No interactions noted. Amio bolus ordered with oral maintenance.   Amiodarone is metabolized by the cytochrome P450 system and therefore has the potential to cause many drug interactions. Amiodarone has an average plasma half-life of 50 days (range 20 to 100 days).   There is potential for drug interactions to occur several weeks or months after stopping treatment and the onset of drug interactions may be slow after initiating amiodarone.   []  Statins: Increased risk of myopathy. Simvastatin- restrict dose to 20mg  daily. Other statins: counsel patients to report any muscle pain or weakness immediately.  []  Anticoagulants: Amiodarone can increase anticoagulant effect. Consider warfarin dose reduction. Patients should be monitored closely and the dose of anticoagulant altered accordingly, remembering that amiodarone levels take several weeks to stabilize.  []  Antiepileptics: Amiodarone can increase plasma concentration of phenytoin, the dose should be reduced. Note that small changes in phenytoin dose can result in large changes in levels. Monitor patient and counsel on signs of toxicity.  []  Beta blockers: increased risk of bradycardia, AV block and myocardial depression. Sotalol - avoid concomitant use.  []   Calcium channel blockers (diltiazem and verapamil): increased risk of bradycardia, AV block and myocardial depression.  []   Cyclosporine: Amiodarone increases levels of cyclosporine. Reduced dose of cyclosporine is recommended.  []  Digoxin dose should be halved when amiodarone is started.  []  Diuretics: increased risk of cardiotoxicity if hypokalemia occurs.  []  Oral hypoglycemic agents (glyburide, glipizide, glimepiride): increased risk of hypoglycemia. Patient's glucose levels should be monitored closely when initiating amiodarone therapy.   []  Drugs that prolong the QT interval:  Torsades de pointes risk may be  increased with concurrent use - avoid if possible.  Monitor QTc, also keep magnesium/potassium WNL if concurrent therapy can't be avoided. Marland Kitchen Antibiotics: e.g. fluoroquinolones, erythromycin. . Antiarrhythmics: e.g. quinidine, procainamide, disopyramide, sotalol. . Antipsychotics: e.g. phenothiazines, haloperidol.  . Lithium, tricyclic antidepressants, and methadone.  Thank You,  Albertina Parr, PharmD., BCPS, BCCCP Clinical Pharmacist Please refer to [facility] for unit-specific pharmacist

## 2020-03-11 NOTE — ED Triage Notes (Addendum)
Pt reported new onset chest pain/dizziness at 0830 with pacemaker fire 3x prior to arrival. Reported abdominal pain, diarrhea onset yesterday.  ASA given prior to arrival.

## 2020-03-11 NOTE — ED Notes (Signed)
Medtronic Pacemaker interrogated.   

## 2020-03-12 ENCOUNTER — Encounter: Payer: Medicare Other | Admitting: Cardiology

## 2020-03-12 DIAGNOSIS — I5022 Chronic systolic (congestive) heart failure: Secondary | ICD-10-CM | POA: Diagnosis not present

## 2020-03-12 DIAGNOSIS — D8685 Sarcoid myocarditis: Secondary | ICD-10-CM | POA: Diagnosis not present

## 2020-03-12 LAB — BASIC METABOLIC PANEL
Anion gap: 9 (ref 5–15)
BUN: 18 mg/dL (ref 6–20)
CO2: 23 mmol/L (ref 22–32)
Calcium: 8.3 mg/dL — ABNORMAL LOW (ref 8.9–10.3)
Chloride: 106 mmol/L (ref 98–111)
Creatinine, Ser: 0.74 mg/dL (ref 0.44–1.00)
GFR, Estimated: >60 mL/min (ref >60)
Glucose, Bld: 117 mg/dL — ABNORMAL HIGH (ref 70–99)
Potassium: 3.8 mmol/L (ref 3.5–5.1)
Sodium: 138 mmol/L (ref 135–145)

## 2020-03-12 LAB — GLUCOSE, CAPILLARY
Glucose-Capillary: 98 mg/dL (ref 70–99)
Glucose-Capillary: 246 mg/dL — ABNORMAL HIGH (ref 70–99)
Glucose-Capillary: 427 mg/dL — ABNORMAL HIGH (ref 70–99)
Glucose-Capillary: 265 mg/dL — ABNORMAL HIGH (ref 70–99)

## 2020-03-12 LAB — FUNGUS CULTURE WITH STAIN

## 2020-03-12 LAB — FUNGUS CULTURE RESULT

## 2020-03-12 LAB — FUNGAL ORGANISM REFLEX

## 2020-03-12 LAB — MAGNESIUM: Magnesium: 2.2 mg/dL (ref 1.7–2.4)

## 2020-03-12 MED ORDER — SPIRONOLACTONE 12.5 MG HALF TABLET
12.5000 mg | ORAL_TABLET | Freq: Every day | ORAL | Status: DC
  Administered 2020-03-12: 12.5 mg via ORAL
  Filled 2020-03-12: qty 1

## 2020-03-12 MED ORDER — CARVEDILOL 3.125 MG PO TABS
3.1250 mg | ORAL_TABLET | Freq: Two times a day (BID) | ORAL | Status: DC
  Administered 2020-03-12 – 2020-03-13 (×3): 3.125 mg via ORAL
  Filled 2020-03-12 (×3): qty 1

## 2020-03-12 MED ORDER — VENLAFAXINE HCL ER 150 MG PO CP24
150.0000 mg | ORAL_CAPSULE | Freq: Every day | ORAL | Status: DC
  Administered 2020-03-13: 150 mg via ORAL
  Filled 2020-03-12: qty 1

## 2020-03-12 MED ORDER — POTASSIUM CHLORIDE CRYS ER 20 MEQ PO TBCR
40.0000 meq | EXTENDED_RELEASE_TABLET | Freq: Once | ORAL | Status: AC
  Administered 2020-03-12: 40 meq via ORAL
  Filled 2020-03-12: qty 2

## 2020-03-12 MED ORDER — INSULIN ASPART 100 UNIT/ML ~~LOC~~ SOLN
4.0000 [IU] | Freq: Three times a day (TID) | SUBCUTANEOUS | Status: DC
  Administered 2020-03-12 – 2020-03-13 (×3): 4 [IU] via SUBCUTANEOUS

## 2020-03-12 MED ORDER — INSULIN DETEMIR 100 UNIT/ML ~~LOC~~ SOLN
15.0000 [IU] | Freq: Every day | SUBCUTANEOUS | Status: DC
  Administered 2020-03-12 – 2020-03-13 (×2): 15 [IU] via SUBCUTANEOUS
  Filled 2020-03-12 (×2): qty 0.15

## 2020-03-12 MED ORDER — INSULIN ASPART 100 UNIT/ML ~~LOC~~ SOLN
0.0000 [IU] | Freq: Three times a day (TID) | SUBCUTANEOUS | Status: DC
  Administered 2020-03-12: 15 [IU] via SUBCUTANEOUS
  Administered 2020-03-13: 8 [IU] via SUBCUTANEOUS
  Administered 2020-03-13: 11 [IU] via SUBCUTANEOUS

## 2020-03-12 NOTE — Progress Notes (Signed)
Inpatient Diabetes Program Recommendations  AACE/ADA: New Consensus Statement on Inpatient Glycemic Control (2015)  Target Ranges:  Prepandial:   less than 140 mg/dL      Peak postprandial:   less than 180 mg/dL (1-2 hours)      Critically ill patients:  140 - 180 mg/dL   Lab Results  Component Value Date   GLUCAP 427 (H) 03/12/2020   HGBA1C 10.2 (H) 02/06/2020    Review of Glycemic Control Results for Lisa Poole, Lisa Poole (MRN [id number]) as of 03/12/2020 15:53  Ref. Range 03/12/2020 06:51 03/12/2020 11:10 03/12/2020 15:39  Glucose-Capillary Latest Ref Range: 70 - 99 mg/dL 98 246 (H) 427 (H)   Diabetes history: DM 2 Outpatient Diabetes medications:  Farxiga 10 mg daily, Humalog 5-20 units tid, Levemir 7 units daily Current orders for Inpatient glycemic control:  Novolog moderate tid with meals and HS Prednisone 60 mg daily  Inpatient Diabetes Program Recommendations:    Please consider adding Levemir 15 units daily.  Also consider adding Novolog 4 units tid with meals (hold if patient eats less than 50%).   Thanks  Adah Perl, RN, BC-ADM Inpatient Diabetes Coordinator Pager [phone] (8a-5p)

## 2020-03-12 NOTE — Progress Notes (Signed)
Discussed with [facility] and unlikely to have bed available today.   She has transiently required neo for low MAPs, so downgrading bed for a quicker transfer would not be possible.   Will follow up with transfer center in am.   Have signed out to evening APP in the event a bed becomes available.   Legrand Como [address]" [place], PA-C  03/12/2020 3:07 PM

## 2020-03-12 NOTE — Discharge Summary (Signed)
ELECTROPHYSIOLOGY DISCHARGE SUMMARY    Patient ID: Lisa Poole,  MRN: [id number], DOB/AGE: 1965-08-11 54 y.o.  Admit date: 03/11/2020 Discharge date: 03/14/2020  Primary Care Physician: Milford Cage, PA  Primary Cardiologist: Dr. Aundra Dubin Electrophysiologist: Dr. Quentin Ore  Primary Discharge Diagnosis:  VT storm  Secondary Discharge Diagnosis:  Cardiac Sarcoidosis (by CMRI and PET scan) CHB s/p MDT dual chamber device  Chronic systolic CHF due to NICM Thrush DMII H/o DVT Urinary retention  Brief HPI: Lisa Poole is a 54 y.o. female with a history of h/o HTN, HLD, T2DM, CHB s/p PPM implant 02/2019 paroxysmal atrial fibrillation, cardiac sarcoid, VT, COVID-19infection in April 9767 and systolic heart failure/ nonischemic cardiomyopathy admitted for VT storm.   LHC 09/2018 showed no significant coronary disease.Therewas minimal systolic bridging of 5 to 10% in the mid LAD. The left circumflex vesselwas normal. The RCA is a dominant vessel with a superior takeoff andwas normal.Echo 5/2020showedLVEF 60-65%, mild LVH, RV normal. Mild- mod AI, noaorticstenosis.  In 02/2019, she presented w/ chest pain and found to be in CHB and underwentPPMimplant (Medtronic).   In April 2021, she was diagnosed w/ COVID-19but did not require hospitalization.   [facility] of progressive dyspnea, intermittent CP, orthopnea, PND and LEE.EKG and tele w/ WCT, felt to be VT. Rates in the 160s-170s. Started on IV amiodarone in [place] conversion to NSR.Echowasrepeatedin 7/21 andshowednew drop in LVEF, down to 20-25% w/ regional wall motion abnormalties,most prominent in anterior, septal, and lateral walls. Moderate to severe TR with elevated RVSP. Moderate MR. LV thrombus excluded by definity contrast. RV systolic function normal.She developed worsening HF/ cardiogenic shock w/ low Co-ox in the 40s, required inotropic support + placement  of IABP. IV amiodaronecontinuedfor VT suppression. Developed Torsades/VT x 3 on 7/24/21with prolonged QT required DC-CV. Amio switched to lido and later started on mexiletine.Had repeat LHC in 7/21that showed no significant coronary disease.  cMRI 12/05/2019 c/w possible cardiac sarcoidosis vs prior myocarditis. She was placed on empiric prednisone for possible sarcoid w/ plans to refer to [facility] for cardiac PET scan. Of note, chest CT 7/21 with ground glass lower lobes but nodefiniteevidence for pulmonary sarcoidosis. She underwent device upgradeto CRT-D prior to d/c. She went home on prednisone.   She was admitted in 8/21 with recurrent VT when prednisone was weaned down some. Prednisone was increased and methotrexate added, home on mexiletine.   PET scan at [facility] in 8/21 was concerning for active cardiac inflammation consistent with cardiac sarcoidosis.   Back again with VT in 9/21, again with taper of prednisone. Amiodarone was restarted and mexiletine was stopped, prednisone increased again. Transbronchial biopsy did not show evidence for pulmonary sarcoidosis. Echo in 9/21 showed EF up some to 35-40% with normal RV. RHC in 10/21 showed low filling pressures and preserved cardiac output.   Followed closely by Dr Aundra Dubin and was last seen 02/26/20. At that time she was taking methotrexate 15 mg with plans to increase methotrexate up to 20 -25 mg per day. Continue on prednisone 60mg  daily.  Later that day  that day she was seen by EP and was having ongoing VT. She was continue on amio 200 mg twice a day and started on mexiletine 150 mg twice a day.   Hospital Course:  The patient was admitted 11/1 after multiple ICD shocks that morning. She had been her usual state of health prior to the weekend. She had general illness with non specific symptoms of abdominal pain and  loose stools. No fever or chills. She also noted she had begun to develop thrush.  She had planned to see her PCP morning  of 11/1, but got up and went to the toilet, felt very dizzy, and was shocked by her ICD 3 times within a few minutes.   Pertinent labs on admission included K 3.6, Cr 1.09, Mg 1.7, WBC 9.9, Hgb 16.2  ICD interrogation on arrival demonstrated appropriate therapy for VT. ATP had been successful at times, unssucessful and accelerating her arrhyhtmia the times that she was shocked.  Her VT significantly quieted, though gradually, overnight on IV amiodarone.  They were monitored on telemetry overnight which demonstrated BiV pacing at 70 mostly, with occasional PVCs and NSVT as her VT continued to settle on amiodarone. The patient initially require neosynephrine for low MAPS in the 60s, but this was able to be weaned off and stopped without issue. The patient was examined and considered to be stable for transfer to [facility] as accepted by Dr. Mosetta Pigeon. The patient will be seen back by Dr. Quentin Ore and Dr. Aundra Dubin pending her discharge and course at [facility].   Physical Exam: Vitals:   03/13/20 2028 03/13/20 2058 03/13/20 2100 03/13/20 2150  BP: 115/61  107/73 107/73  Pulse: 73  75 75  Resp: (!) 21  (!) 21 (!) 25  Temp: 98.7 F (37.1 C) 98.2 F (36.8 C)  98.2 F (36.8 C)  TempSrc:  Oral    SpO2: 98%  96% 98%  Weight:      Height:        GEN- The patient is fatigued appearing, alert and oriented x 3 today.   HEENT: normocephalic, atraumatic; sclera clear, conjunctiva pink; hearing intact; oropharynx clear; neck supple  Lungs- Clear to ausculation bilaterally, normal work of breathing.  No wheezes, rales, rhonchi Heart- Regular rate and rhythm, no murmurs, rubs or gallops  GI- soft, non-tender, non-distended, bowel sounds present  Extremities- no clubbing, cyanosis, or edema; DP/PT/radial pulses 2+ bilaterally, groin without hematoma/bruit MS- no significant deformity or atrophy Skin- warm and dry, no rash or lesion Psych- flat but appropriate affect Neuro- strength and sensation are intact   Labs:    Lab Results  Component Value Date   WBC 9.9 03/11/2020   HGB 16.2 (H) 03/11/2020   HCT 51.1 (H) 03/11/2020   MCV 94.8 03/11/2020   PLT 268 03/11/2020    Recent Labs  Lab 03/13/20 0423  NA 138  K 4.0  CL 105  CO2 25  BUN 16  CREATININE 0.80  CALCIUM 8.3*  GLUCOSE 168*     Discharge Medications:  Allergies as of 03/13/2020      Reactions   Lisinopril Other (See Comments), Cough   Flu like symptoms, also   Ace Inhibitors Other (See Comments)   Flu-like symptoms   Augmentin [amoxicillin-pot Clavulanate] Nausea And Vomiting   Benazepril Other (See Comments)   Flu like symptoms   Oxycodone-acetaminophen Nausea And Vomiting   Sulfa Antibiotics Nausea And Vomiting      Medication List    STOP taking these medications   Entresto 49-51 MG Generic drug: sacubitril-valsartan   HYDROcodone-acetaminophen 10-325 MG tablet Commonly known as: NORCO   methotrexate 2.5 MG tablet   sacubitril-valsartan 24-26 MG Commonly known as: ENTRESTO     TAKE these medications   acetaminophen 325 MG tablet Commonly known as: TYLENOL Take 2 tablets (650 mg total) by mouth every 4 (four) hours as needed for headache or mild pain.   albuterol  108 (90 Base) MCG/ACT inhaler Commonly known as: VENTOLIN HFA Inhale 1-2 puffs into the lungs every 6 (six) hours as needed for wheezing or shortness of breath.   amiodarone 200 MG tablet Commonly known as: PACERONE Take 200 mg by mouth 2 (two) times daily. What changed: Another medication with the same name was added. Make sure you understand how and when to take each.   amiodarone 1.8 mg/mL Soln Commonly known as: NEXTERONE Inject 83.34 mLs (150 mg total) into the vein once for 1 dose. What changed: You were already taking a medication with the same name, and this prescription was added. Make sure you understand how and when to take each.   amiodarone 360-4.14 MG/200ML-% Soln Commonly known as: NEXTERONE PREMIX Inject 30 mg/hr into the  vein continuous.   apixaban 5 MG Tabs tablet Commonly known as: ELIQUIS Take 1 tablet (5 mg total) by mouth 2 (two) times daily.   Biotin 10 MG Caps Take 10 mg by mouth daily.   carvedilol 3.125 MG tablet Commonly known as: COREG Take 1 tablet (3.125 mg total) by mouth 2 (two) times daily with a meal. What changed:   medication strength  how much to take   Centrum Silver 50+Women Tabs Take 1 tablet by mouth daily.   Cool Blood Glucose Test Strips test strip Generic drug: glucose blood Use as instructed What changed: Another medication with the same name was removed. Continue taking this medication, and follow the directions you see here.   Farxiga 10 MG Tabs tablet Generic drug: dapagliflozin propanediol TAKE 1 TABLET(10 MG) BY MOUTH DAILY What changed: See the new instructions.   fluconazole 100 MG tablet Commonly known as: DIFLUCAN Take 1 tablet (100 mg total) by mouth daily.   folic acid 1 MG tablet Commonly known as: FOLVITE Take 1 tablet (1 mg total) by mouth daily.   gabapentin 600 MG tablet Commonly known as: NEURONTIN Take 600 mg by mouth 2 (two) times daily.   HumaLOG KwikPen 100 UNIT/ML KwikPen Generic drug: insulin lispro Inject 5-20 Units into the skin 3 (three) times daily. Sliding scale as directed   insulin detemir 100 UNIT/ML FlexPen Commonly known as: LEVEMIR Inject 7 Units into the skin daily.   metFORMIN 1000 MG tablet Commonly known as: GLUCOPHAGE Take 1 tablet (1,000 mg total) by mouth 2 (two) times daily with a meal.   mexiletine 150 MG capsule Commonly known as: MEXITIL Take 1 capsule (150 mg total) by mouth 2 (two) times daily.   omeprazole 40 MG capsule Commonly known as: PRILOSEC Take 40 mg by mouth daily.   Pen Needles 32G X 4 MM Misc 1 Units by Does not apply route daily.   pravastatin 10 MG tablet Commonly known as: PRAVACHOL Take 10 mg by mouth daily.   predniSONE 20 MG tablet Commonly known as: DELTASONE Take 3  tablets (60 mg total) by mouth daily with breakfast.   spironolactone 25 MG tablet Commonly known as: ALDACTONE TAKE 1 TABLET(25 MG) BY MOUTH DAILY What changed: Another medication with the same name was added. Make sure you understand how and when to take each.   spironolactone 25 MG tablet Commonly known as: ALDACTONE Take 1 tablet (25 mg total) by mouth daily. What changed: You were already taking a medication with the same name, and this prescription was added. Make sure you understand how and when to take each.   sulfamethoxazole-trimethoprim 800-160 MG tablet Commonly known as: BACTRIM DS Take 1 tablet by mouth 3 (three)  times a week.   venlafaxine XR 150 MG 24 hr capsule Commonly known as: EFFEXOR-XR Take 150 mg by mouth daily with breakfast.   Vitamin D-3 25 MCG (1000 UT) Caps Take 1,000 Units by mouth daily.       Disposition:    Follow-up Information    Larey Dresser, MD Follow up on 04/02/2020.   Specialty: Cardiology Why: at 1140 for post hospital follow up. Contact information: [place] [address] [phone]        Vickie Epley, MD Follow up.   Specialties: Cardiology, Radiology Why: Please call for appointment after discharge from [facility], or can be scheduled from your f/u with Dr. Aundra Dubin 11/23. Contact information: [address] [place] [address] [phone]               Duration of Discharge Encounter: Greater than 30 minutes including physician time.  Jacalyn Lefevre, PA-C  03/14/2020 7:24 AM

## 2020-03-12 NOTE — Progress Notes (Addendum)
Advanced Heart Failure Rounding Note  PCP-Cardiologist: No primary care provider on file.   Subjective:    Remains on amio 30 mg per hour. Few short runs of slow VT overnight.   No VT overnight. Transferring to [facility] when bed available.   Denies chest pain. Denies nausea.  Feels better.  Objective:   Weight Range: 76.9 kg Body mass index is 33.11 kg/m.   Vital Signs:   Temp:  [98.5 F (36.9 C)] 98.5 F (36.9 C) (11/01 0940) Pulse Rate:  [41-937] 70 (11/02 0600) Resp:  [10-24] 18 (11/02 0600) BP: (63-103)/(36-79) 94/76 (11/02 0600) SpO2:  [90 %-100 %] 98 % (11/02 0600) Arterial Line BP: (89-114)/(52-65) 113/63 (11/02 0600) Weight:  [69.9 kg-76.9 kg] 76.9 kg (11/02 0500)    Weight change: Filed Weights   03/11/20 0940 03/12/20 0500  Weight: 69.9 kg 76.9 kg    Intake/Output:   Intake/Output Summary (Last 24 hours) at 03/12/2020 0710 Last data filed at 03/12/2020 0600 Gross per 24 hour  Intake 1951.47 ml  Output --  Net 1951.47 ml      Physical Exam    General:  No resp difficulty HEENT: Normal Neck: Supple. JVP flat  . Carotids 2+ bilat; no bruits. No lymphadenopathy or thyromegaly appreciated. Cor: PMI nondisplaced. Regular rate & rhythm. No rubs, gallops or murmurs. Lungs: Clear Abdomen: Soft, nontender, nondistended. No hepatosplenomegaly. No bruits or masses. Good bowel sounds. Extremities: No cyanosis, clubbing, rash, edema Neuro: Alert & orientedx3, cranial nerves grossly intact. moves all 4 extremities w/o difficulty. Affect pleasant   Telemetry   NSR with brief NSVT 80s   EKG    n/a  Labs    CBC Recent Labs    03/11/20 1005  WBC 9.9  NEUTROABS 8.0*  HGB 16.2*  HCT 51.1*  MCV 94.8  PLT 902   Basic Metabolic Panel Recent Labs    03/11/20 1005 03/12/20 0411  NA 140 138  K 3.6 3.8  CL 106 106  CO2 22 23  GLUCOSE 148* 117*  BUN 28* 18  CREATININE 1.09* 0.74  CALCIUM 9.2 8.3*  MG 1.7  --    Liver Function Tests No  results for input(s): AST, ALT, ALKPHOS, BILITOT, PROT, ALBUMIN in the last 72 hours. No results for input(s): LIPASE, AMYLASE in the last 72 hours. Cardiac Enzymes No results for input(s): CKTOTAL, CKMB, CKMBINDEX, TROPONINI in the last 72 hours.  BNP: BNP (last 3 results) Recent Labs    11/26/19 2011 02/06/20 0725  BNP 1,258.1* 344.3*    ProBNP (last 3 results) No results for input(s): PROBNP in the last 8760 hours.   D-Dimer No results for input(s): DDIMER in the last 72 hours. Hemoglobin A1C No results for input(s): HGBA1C in the last 72 hours. Fasting Lipid Panel No results for input(s): CHOL, HDL, LDLCALC, TRIG, CHOLHDL, LDLDIRECT in the last 72 hours. Thyroid Function Tests No results for input(s): TSH, T4TOTAL, T3FREE, THYROIDAB in the last 72 hours.  Invalid input(s): FREET3  Other results:   Imaging    DG Chest Port 1 View  Result Date: 03/11/2020 CLINICAL DATA:  Chest pain today ,/Vtach ,,hx pacer/defib Diabetes EXAM: PORTABLE CHEST 1 VIEW COMPARISON:  None. FINDINGS: LEFT-sided pacemaker overlies normal cardiac silhouette. No effusion, infiltrate, or pneumothorax. Degenerative changes at the RIGHT shoulder. IMPRESSION: No acute cardiopulmonary process. Electronically Signed   By: Suzy Bouchard M.D.   On: 03/11/2020 10:40      Medications:     Scheduled Medications: . amiodarone  150 mg Intravenous Once  . apixaban  5 mg Oral BID  . Chlorhexidine Gluconate Cloth  6 each Topical Daily  . cholecalciferol  1,000 Units Oral Daily  . feeding supplement  1 Container Oral TID BM  . fluconazole  100 mg Oral Daily  . folic acid  1 mg Oral Daily  . gabapentin  600 mg Oral BID  . insulin aspart  0-5 Units Subcutaneous QHS  . insulin aspart  0-9 Units Subcutaneous TID WC  . mouth rinse  15 mL Mouth Rinse BID  . mexiletine  150 mg Oral BID  . multivitamin with minerals  1 tablet Oral Daily  . pantoprazole  80 mg Oral Daily  . pravastatin  10 mg Oral Daily   . predniSONE  60 mg Oral Q breakfast  . sodium chloride flush  3 mL Intravenous Q12H  . sulfamethoxazole-trimethoprim  1 tablet Oral Once per day on Mon Wed Fri     Infusions: . sodium chloride    . sodium chloride 10 mL/hr at 03/12/20 0600  . amiodarone 30 mg/hr (03/12/20 0600)  . phenylephrine (NEO-SYNEPHRINE) Adult infusion Stopped (03/12/20 0305)     PRN Medications:  sodium chloride, acetaminophen, albuterol, fentaNYL (SUBLIMAZE) injection, HYDROcodone-acetaminophen, midazolam, ondansetron (ZOFRAN) IV, sodium chloride flush    Patient Profile  Lisa Poole is a 54 year old with a history of h/o HTN, HLD, T2DM, CHB s/p PPM implant 02/2019 paroxysmal atrial fibrillation, cardiac sarcoid, VT, COVID-19infection in April 0017 and systolic heart failure/ nonischemic cardiomyopathy   Assessment/Plan   1. VT Has medtronic ICD.  Multiple appropriate shocks for VT.  Per EP . Started on amiodarone drip, continuing mexiletine.  - Prior to admit she was taking mexiletine 150 mg twice a day but missed doses on 03/10/20 2.Cardiogenic Shock in the setting of recurrent VT.   Nonischemic cardiomyopathy. Echoin 7/21with EF newly decreased to 20-25% with septal-lateral dyssynchrony and septal severe hypokinesis, normal RV, moderate TR, moderate MR. Echo in 5/20 prior to PPM placement showed EF 60-65%. She developed shock in 7/21 requiring pressors/inotropes and IABP.LHC/RHC 7/23/21with no significant CAD, elevated filling pressures, and low cardiac output. cMRI 7/27/21showed patchy LGE in the basal septum, basal-mid inferoseptum/inferior wall, basal anterior wall, mid anterolateral wall. Cardiac MRI LGE pattern could be consistent with cardiac sarcoidosis (would explain earlier CHB). Alternatively, prior viral myocarditis (recent COVID-19). CT chest with ground glass lower lobes but no evidence for pulmonary sarcoidosis, transbronchial biopsy did not show granulomatous disease. She is  now s/p device upgrade to MDT CRT-D.Cardiac PET in 8/21 was showed active inflammation in the heart, concerning again for cardiac sarcoidosis. Most recent echo in 9/21 with EF up to 35-40%. - Hypotensive ---> On Neo briefly. Overnight BP stable.  Renal function stable.  - Volume status stable.  - Holding entresto, bb with shock.  3. Cardiac Sarcoid Cardiac MRI and cardiac PET both suggestive of cardiac sarcoidosis, and unexplained CHB also concerning, as is VT.She has had VT with prednisone weaning. We have suspected isolated cardiac sarcoidosis. Transbronchial biopsy did not show granulomatous disease in lungs. - She has been on methotrexate 15 mg weekly + prednisone 60 mg daily.   - PJP prophylaxis --> bactrim + folate.  4. H/O CHB  5. DMII Will need SSI.  6. H/O DVT 7. Urinary Retention  No urine output >16 hours. Bladder scan with >800 cc urine.  - I &O cath  Length of Stay: 1  Amy Clegg, NP  03/12/2020, 7:10 AM  Advanced  Heart Failure Team Pager [phone] (M-F; [place])  Please contact [facility] for night-coverage after hours (4p -7a ) and weekends on [email or web address]  Patient seen with NP, agree with the above note.   Arrhythmias calmed overnight, has had a few short runs of slow VT.  Remains in NSR with BiV pacing on amiodarone gtt + mexiletine.  She is no longer getting sedation (no further long VT runs) and is off phenylephrine.  SBP 110s on arterial line.   General: NAD Neck: No JVD, no thyromegaly or thyroid nodule.  Lungs: Clear to auscultation bilaterally with normal respiratory effort. CV: Nondisplaced PMI.  Heart regular S1/S2, no S3/S4, no murmur.  No peripheral edema.    Abdomen: Soft, nontender, no hepatosplenomegaly, no distention.  Skin: Intact without lesions or rashes.  Neurologic: Alert and oriented x 3.  Psych: Normal affect. Extremities: No clubbing or cyanosis.  HEENT: Normal.   Continue amiodarone gtt + mexiletine and will restart Coreg at 3.125  mg bid with stable BP.  Discussed with HF service at [facility], will transfer this morning for evaluation/endomyocardial biopsy.   Volume status stable, recent echo with EF improved to 35-40%.  With hypotension last night when sedated (resolved) will leave off Entresto for now, restart Coreg at 3.125 mg bid and spironolactone at 12.5 daily.   As above, with unexplained CHB + VT + cardiac MRI and cardiac PET findings, strongly concerned for cardiac sarcoidosis (possibly isolated, she had hilar/mediastinal lymphadenopathy but transbronchial biopsy negative).  With yet another VT storm episode with multiple shocks despite prednisone 60 daily + MTX up to 15 weekly, think we need tissue diagnosis.  Could we be dealing with giant cell myocarditis?  - As above, transfer to [facility] for consideration of endomyocardial biopsy.  - For now, continue MTX + prednisone 60 + PJP prophylaxis with Bactrim/folate.   Loralie Champagne 03/12/2020 7:44 AM

## 2020-03-12 NOTE — Progress Notes (Signed)
Remote ICD transmission.   

## 2020-03-12 NOTE — Progress Notes (Signed)
Electrophysiology Rounding Note  Patient Name: Lisa Poole Date of Encounter: 03/12/2020  Primary Cardiologist: Dr. Aundra Dubin Electrophysiologist: Dr. Quentin Ore and Dr. Lovena Le   Subjective   Remains on 30 mg per hour. Few short runs of slow VT overnight, all broke spontaneously. No sustained.   Denies chest pain. Overall feeling slightly better.   Inpatient Medications    Scheduled Meds: . amiodarone  150 mg Intravenous Once  . apixaban  5 mg Oral BID  . carvedilol  3.125 mg Oral BID WC  . Chlorhexidine Gluconate Cloth  6 each Topical Daily  . cholecalciferol  1,000 Units Oral Daily  . feeding supplement  1 Container Oral TID BM  . fluconazole  100 mg Oral Daily  . folic acid  1 mg Oral Daily  . gabapentin  600 mg Oral BID  . insulin aspart  0-5 Units Subcutaneous QHS  . insulin aspart  0-9 Units Subcutaneous TID WC  . mouth rinse  15 mL Mouth Rinse BID  . mexiletine  150 mg Oral BID  . multivitamin with minerals  1 tablet Oral Daily  . pantoprazole  80 mg Oral Daily  . pravastatin  10 mg Oral Daily  . predniSONE  60 mg Oral Q breakfast  . sodium chloride flush  3 mL Intravenous Q12H  . spironolactone  12.5 mg Oral Daily  . sulfamethoxazole-trimethoprim  1 tablet Oral Once per day on Mon Wed Fri   Continuous Infusions: . sodium chloride    . sodium chloride 10 mL/hr at 03/12/20 0700  . amiodarone 30 mg/hr (03/12/20 0700)  . phenylephrine (NEO-SYNEPHRINE) Adult infusion Stopped (03/12/20 0305)   PRN Meds: sodium chloride, acetaminophen, albuterol, fentaNYL (SUBLIMAZE) injection, HYDROcodone-acetaminophen, midazolam, ondansetron (ZOFRAN) IV, sodium chloride flush   Vital Signs    Vitals:   03/12/20 0400 03/12/20 0500 03/12/20 0600 03/12/20 0700  BP: (!) 83/61 94/62 94/76  100/74  Pulse: 70 69 70 70  Resp: 12 13 18 11   Temp:      TempSrc:      SpO2: 97% 97% 98% 97%  Weight:  76.9 kg    Height:        Intake/Output Summary (Last 24 hours) at 03/12/2020  0748 Last data filed at 03/12/2020 0700 Gross per 24 hour  Intake 1977.66 ml  Output --  Net 1977.66 ml   Filed Weights   03/11/20 0940 03/12/20 0500  Weight: 69.9 kg 76.9 kg    Physical Exam    GEN- The patient is fatigued appearing, alert and oriented x 3 today.   Head- normocephalic, atraumatic Eyes-  Sclera clear, conjunctiva pink Ears- hearing intact Oropharynx- clear Neck- supple Lungs- Clear to ausculation bilaterally, normal work of breathing Heart- Regular rate and rhythm, no murmurs, rubs or gallops GI- soft, NT, ND, + BS Extremities- no clubbing or cyanosis. No edema Skin- no rash or lesion Psych- euthymic mood, full affect Neuro- strength and sensation are intact  Labs    CBC Recent Labs    03/11/20 1005  WBC 9.9  NEUTROABS 8.0*  HGB 16.2*  HCT 51.1*  MCV 94.8  PLT 568   Basic Metabolic Panel Recent Labs    03/11/20 1005 03/12/20 0411  NA 140 138  K 3.6 3.8  CL 106 106  CO2 22 23  GLUCOSE 148* 117*  BUN 28* 18  CREATININE 1.09* 0.74  CALCIUM 9.2 8.3*  MG 1.7  --    Liver Function Tests No results for input(s): AST, ALT, ALKPHOS, BILITOT, PROT,  ALBUMIN in the last 72 hours. No results for input(s): LIPASE, AMYLASE in the last 72 hours. Cardiac Enzymes No results for input(s): CKTOTAL, CKMB, CKMBINDEX, TROPONINI in the last 72 hours.   Telemetry    V pacing (CRT) in 70s, VT has significantly quieted overnight. She continued to have NSVT in the 4 to 18 beat range, but all resolved spontaneously on amio (personally reviewed)  Radiology    DG Chest Port 1 View  Result Date: 03/11/2020 CLINICAL DATA:  Chest pain today ,/Vtach ,,hx pacer/defib Diabetes EXAM: PORTABLE CHEST 1 VIEW COMPARISON:  None. FINDINGS: LEFT-sided pacemaker overlies normal cardiac silhouette. No effusion, infiltrate, or pneumothorax. Degenerative changes at the RIGHT shoulder. IMPRESSION: No acute cardiopulmonary process. Electronically Signed   By: Suzy Bouchard M.D.    On: 03/11/2020 10:40    Patient Profile     Lisa Poole is a 54 y.o. female with a history of CHB s/p medtronic PPM -> upgrade to ICD in the setting of VT, Likely cardiac sarcoidosis, DM2, paroxysmal atrial fibrillation, h/o COVID-19 in 08/2019, and chronic systolic CHF / NICM who was admitted for ICD shock and found to be in VT storm.   Assessment & Plan    1.  ICD shock with recurrent VT/VT storm K 3.8 this am. Supped Mg 1.7 yesterday and supped. Repeat pending.   She has quieted on amiodarone gtt.   We also increased her initial detection to 100 beats to delay ATP with a significant decrease in the amount of ATP she was receiving.  Continue mexiletine.   Continue therapy for suspected sarcoidosis as belowl  2. VT 2/2 Cardiac sarcoidosis Has not been well controlled on amiodarone Mexiletine added 02/26/2020, but amiodarone had been decreased as well to 200 mg BID (per chart) Cardiac PET at [facility] 12/2019 showed active inflammation in the heart, concerning  for cardiac sarcoidosis She is on prednisone 60 mg daily with plans for very slow taper. She is on methotrexate once a week She has continued to have VT despite attempts at suppression with prednisone and on amiodarone + mexiletine.  Dr. Aundra Dubin discussed with Dr. Elsie Saas at [facility] who has agreed to take the patient in transfer for consideration of cardiac biopsy for definitive diagnosis.  Dr. Caryl Comes also discussed with Dr. Sampson Si at [facility] who also recommended cardiac biopsy.  We have reached out to [facility] and are awaiting word re: transfer timing.  3. CHB s/p MDT dual chamber PPM (now ICD) Stable.  4. Chronic systolic CHF due to NICM On appropriate medical regimen followed by Dr. Aundra Dubin.  K 5.2 previously and spironolactone stopped. Now slightly low.  Most of her meds have been held due to soft pressures transiently requiring neosynephrine  5. Thrush Continue fluconoazole   6. Goals of  care She verbalizes that she is "so tired of this" She agrees that a definitive diagnosis would be helpful and agrees to transfer to [facility] for biopsy.   For questions or updates, please contact [place] Please consult [email or web address] for contact info under Cardiology/STEMI.  Signed, Shirley Friar, PA-C  03/12/2020, 7:48 AM

## 2020-03-13 ENCOUNTER — Other Ambulatory Visit (HOSPITAL_COMMUNITY): Payer: Self-pay

## 2020-03-13 DIAGNOSIS — D8685 Sarcoid myocarditis: Secondary | ICD-10-CM | POA: Diagnosis not present

## 2020-03-13 DIAGNOSIS — I5022 Chronic systolic (congestive) heart failure: Secondary | ICD-10-CM | POA: Diagnosis not present

## 2020-03-13 LAB — BASIC METABOLIC PANEL
Anion gap: 8 (ref 5–15)
BUN: 16 mg/dL (ref 6–20)
CO2: 25 mmol/L (ref 22–32)
Calcium: 8.3 mg/dL — ABNORMAL LOW (ref 8.9–10.3)
Chloride: 105 mmol/L (ref 98–111)
Creatinine, Ser: 0.8 mg/dL (ref 0.44–1.00)
GFR, Estimated: >60 mL/min (ref >60)
Glucose, Bld: 168 mg/dL — ABNORMAL HIGH (ref 70–99)
Potassium: 4 mmol/L (ref 3.5–5.1)
Sodium: 138 mmol/L (ref 135–145)

## 2020-03-13 LAB — GLUCOSE, CAPILLARY
Glucose-Capillary: 116 mg/dL — ABNORMAL HIGH (ref 70–99)
Glucose-Capillary: 301 mg/dL — ABNORMAL HIGH (ref 70–99)
Glucose-Capillary: 298 mg/dL — ABNORMAL HIGH (ref 70–99)
Glucose-Capillary: 226 mg/dL — ABNORMAL HIGH (ref 70–99)

## 2020-03-13 MED ORDER — METHOTREXATE SODIUM 2.5 MG PO TABS: ORAL_TABLET | ORAL | 1 refills | Status: DC

## 2020-03-13 MED ORDER — INSULIN ASPART 100 UNIT/ML ~~LOC~~ SOLN
8.0000 [IU] | Freq: Three times a day (TID) | SUBCUTANEOUS | Status: DC
  Administered 2020-03-13: 8 [IU] via SUBCUTANEOUS

## 2020-03-13 MED ORDER — METHOTREXATE 2.5 MG PO TABS
17.5000 mg | ORAL_TABLET | Freq: Once | ORAL | Status: AC
  Administered 2020-03-13: 17.5 mg via ORAL
  Filled 2020-03-13: qty 7

## 2020-03-13 MED ORDER — ENSURE MAX PROTEIN PO LIQD
11.0000 [oz_av] | Freq: Every day | ORAL | Status: DC
  Filled 2020-03-13 (×2): qty 330

## 2020-03-13 MED ORDER — SPIRONOLACTONE 25 MG PO TABS
25.0000 mg | ORAL_TABLET | Freq: Every day | ORAL | Status: DC
  Administered 2020-03-13: 25 mg via ORAL
  Filled 2020-03-13: qty 1

## 2020-03-13 NOTE — Progress Notes (Signed)
Electrophysiology Rounding Note  Patient Name: Lisa Poole Date of Encounter: 03/13/2020  Primary Cardiologist: Dr. Aundra Poole Electrophysiologist: Dr. Quentin Poole and Dr. Lovena Poole  Subjective   The patient is doing OK today. Continues to have NSVT on IV amiodarone, but far less.   At this time, the patient denies chest pain, shortness of breath, or any new concerns. She did require neosynephrine overnight transiently again.   Inpatient Medications    Scheduled Meds: . amiodarone  150 mg Intravenous Once  . apixaban  5 mg Oral BID  . carvedilol  3.125 mg Oral BID WC  . Chlorhexidine Gluconate Cloth  6 each Topical Daily  . cholecalciferol  1,000 Units Oral Daily  . feeding supplement  1 Container Oral TID BM  . fluconazole  100 mg Oral Daily  . folic acid  1 mg Oral Daily  . gabapentin  600 mg Oral BID  . insulin aspart  0-15 Units Subcutaneous TID WC  . insulin aspart  0-5 Units Subcutaneous QHS  . insulin aspart  4 Units Subcutaneous TID WC  . insulin detemir  15 Units Subcutaneous QHS  . mouth rinse  15 mL Mouth Rinse BID  . mexiletine  150 mg Oral BID  . multivitamin with minerals  1 tablet Oral Daily  . pantoprazole  80 mg Oral Daily  . pravastatin  10 mg Oral Daily  . predniSONE  60 mg Oral Q breakfast  . sodium chloride flush  3 mL Intravenous Q12H  . spironolactone  12.5 mg Oral Daily  . sulfamethoxazole-trimethoprim  1 tablet Oral Once per day on Mon Wed Fri  . venlafaxine XR  150 mg Oral Q breakfast   Continuous Infusions: . sodium chloride    . sodium chloride 10 mL/hr at 03/13/20 0700  . amiodarone 30 mg/hr (03/13/20 0700)  . phenylephrine (NEO-SYNEPHRINE) Adult infusion Stopped (03/13/20 0605)   PRN Meds: sodium chloride, acetaminophen, albuterol, fentaNYL (SUBLIMAZE) injection, HYDROcodone-acetaminophen, midazolam, ondansetron (ZOFRAN) IV, sodium chloride flush   Vital Signs    Vitals:   03/13/20 0500 03/13/20 0600 03/13/20 0700 03/13/20 0703  BP: (!)  88/55 107/71 118/83 118/83  Pulse: 74 74 77 75  Resp: 16 17 16    Temp:   98.3 F (36.8 C)   TempSrc:   Oral   SpO2: 97% 97% 97%   Weight: 72.1 kg     Height:        Intake/Output Summary (Last 24 hours) at 03/13/2020 0727 Last data filed at 03/13/2020 0700 Gross per 24 hour  Intake 1412.27 ml  Output 1000 ml  Net 412.27 ml   Filed Weights   03/11/20 0940 03/12/20 0500 03/13/20 0500  Weight: 69.9 kg 76.9 kg 72.1 kg    Physical Exam    GEN- The patient is fatigued appearing, alert and oriented x 3 today.   Head- normocephalic, atraumatic Eyes-  Sclera clear, conjunctiva pink Ears- hearing intact Oropharynx- clear Neck- supple Lungs- Clear to ausculation bilaterally, normal work of breathing Heart- Regular rate and rhythm, no murmurs, rubs or gallops GI- soft, NT, ND, + BS Extremities- no clubbing or cyanosis. No edema Skin- no rash or lesion Psych- euthymic mood, full affect Neuro- strength and sensation are intact  Labs    CBC Recent Labs    03/11/20 1005  WBC 9.9  NEUTROABS 8.0*  HGB 16.2*  HCT 51.1*  MCV 94.8  PLT 161   Basic Metabolic Panel Recent Labs    03/11/20 1005 03/11/20 1005 03/12/20 0411 03/12/20  1106 03/13/20 0423  NA 140   < > 138  --  138  K 3.6   < > 3.8  --  4.0  CL 106   < > 106  --  105  CO2 22   < > 23  --  25  GLUCOSE 148*   < > 117*  --  168*  BUN 28*   < > 18  --  16  CREATININE 1.09*   < > 0.74  --  0.80  CALCIUM 9.2   < > 8.3*  --  8.3*  MG 1.7  --   --  2.2  --    < > = values in this interval not displayed.   Liver Function Tests No results for input(s): AST, ALT, ALKPHOS, BILITOT, PROT, ALBUMIN in the last 72 hours. No results for input(s): LIPASE, AMYLASE in the last 72 hours. Cardiac Enzymes No results for input(s): CKTOTAL, CKMB, CKMBINDEX, TROPONINI in the last 72 hours.   Telemetry    V pacing (CRT) in 70s with occasional NSVT that has continued to quiet on IV amiodarone (personally reviewed)  Radiology     DG Chest Port 1 View  Result Date: 03/11/2020 CLINICAL DATA:  Chest pain today ,/Vtach ,,hx pacer/defib Diabetes EXAM: PORTABLE CHEST 1 VIEW COMPARISON:  None. FINDINGS: LEFT-sided pacemaker overlies normal cardiac silhouette. No effusion, infiltrate, or pneumothorax. Degenerative changes at the RIGHT shoulder. IMPRESSION: No acute cardiopulmonary process. Electronically Signed   By: Suzy Bouchard M.D.   On: 03/11/2020 10:40    Patient Profile     Lisa Tullo Johnsonis a 54 y.o.femalewith a history of CHB s/p medtronic PPM -> upgrade to ICD in the setting of VT, Likely cardiac sarcoidosis, DM2, paroxysmal atrial fibrillation, h/o COVID-19 in 08/2019, and chronic systolic CHF / NICMwho was admitted for ICD shock and found to be in VT storm.   Assessment & Plan    1.ICD shockwith recurrent VT/VT storm K 4.0 this am.  Mg 2.2 yesterday.  She has quieted on amiodarone gtt.   We also increased her initial detection to 100 beats to delay ATP with a significant decrease in the amount of ATP she was receiving.  Continue mexiletine. Continue therapy for suspected sarcoidosis as below Plan on transfer to [facility] pending bed availability  2. VT 2/2 Cardiac sarcoidosis Has not been well controlled on amiodarone Mexiletine added 02/26/2020, but amiodarone had been decreased as well to 200 mg BID (per chart) [facility] showed active inflammation in the heart, concerning for cardiac sarcoidosis She is on prednisone 60 mg daily with plans for very slow taper. She is on methotrexate once a week She has continued to have VT despite attempts at suppression with prednisone and on amiodarone + mexiletine.  Dr. Aundra Poole discussed with Dr. Elsie Poole at [facility] who has agreed to take the patient in transfer for consideration of cardiac biopsy for definitive diagnosis.  Dr. Caryl Poole also discussed with Dr. Sampson Poole at [facility] who also recommended cardiac biopsy.  We have reached out to [facility] and are awaiting word re: transfer timing. They have been updated this am and are aware she is transiently requiring pressor support.   3. CHB s/p MDT dual chamber PPM (now ICD) Stable.  4. Chronic systolic CHF due to NICM On appropriate medical regimen followed by Dr. Aundra Poole.  K 5.2 previously and spironolactone stopped. Entresto held.  Dose of Coreg had to be held last night with hypotension.  5. Thrush Continue fluconoazole to complete course.   6. Goals of care She verbalizes that she is "so tired of this" She agrees that a definitive diagnosis would be helpful and agrees to transfer to [facility] for biopsy.   [facility] called this am and was updated ~0630. No current ICU beds but they will keep [place] updated. I will call back later this am to re-assess if she is able to go several hours without again requiring neo.   For questions or updates, please contact [place] Please consult [email or web address] for contact info under Cardiology/STEMI.  Signed, Shirley Friar, PA-C  03/13/2020, 7:27 AM

## 2020-03-13 NOTE — Progress Notes (Signed)
Pt discharged via life flight to [facility] at 2215. All required documentation printed and sent to [facility]. Discharge instructions gone over with patient.

## 2020-03-13 NOTE — Progress Notes (Signed)
Patient ID: Lisa Poole, female   DOB: 05-27-1965, 54 y.o.   MRN: [id number]     Advanced Heart Failure Rounding Note  PCP-Cardiologist: No primary care provider on file.   Subjective:    Remains on amio 30 mg per hour. Still with a few short runs of slow VT overnight.   Transiently on phenylephrine again but SBP never dropped that low (90s while sleeping).   Denies chest pain. Denies nausea.  Feels better.  Objective:   Weight Range: 72.1 kg Body mass index is 31.04 kg/m.   Vital Signs:   Temp:  [97.8 F (36.6 C)-98.5 F (36.9 C)] 98.3 F (36.8 C) (11/03 0700) Pulse Rate:  [70-80] 75 (11/03 0703) Resp:  [11-23] 16 (11/03 0700) BP: (80-121)/(49-83) 118/83 (11/03 0703) SpO2:  [95 %-100 %] 97 % (11/03 0700) Arterial Line BP: (87-169)/(46-100) 128/74 (11/03 0700) Weight:  [72.1 kg] 72.1 kg (11/03 0500) Last BM Date:  (PTA)  Weight change: Filed Weights   03/11/20 0940 03/12/20 0500 03/13/20 0500  Weight: 69.9 kg 76.9 kg 72.1 kg    Intake/Output:   Intake/Output Summary (Last 24 hours) at 03/13/2020 0749 Last data filed at 03/13/2020 0700 Gross per 24 hour  Intake 1412.27 ml  Output 1000 ml  Net 412.27 ml      Physical Exam    General: NAD Neck: No JVD, no thyromegaly or thyroid nodule.  Lungs: Clear to auscultation bilaterally with normal respiratory effort. CV: Nondisplaced PMI.  Heart regular S1/S2, no S3/S4, no murmur.  No peripheral edema.   Abdomen: Soft, nontender, no hepatosplenomegaly, no distention.  Skin: Intact without lesions or rashes.  Neurologic: Alert and oriented x 3.  Psych: Normal affect. Extremities: No clubbing or cyanosis.  HEENT: Normal.   Telemetry   NSR with few short slow VT runs. Personally reviewed.   EKG    n/a  Labs    CBC Recent Labs    03/11/20 1005  WBC 9.9  NEUTROABS 8.0*  HGB 16.2*  HCT 51.1*  MCV 94.8  PLT 737   Basic Metabolic Panel Recent Labs    03/11/20 1005 03/11/20 1005 03/12/20 0411  03/12/20 1106 03/13/20 0423  NA 140   < > 138  --  138  K 3.6   < > 3.8  --  4.0  CL 106   < > 106  --  105  CO2 22   < > 23  --  25  GLUCOSE 148*   < > 117*  --  168*  BUN 28*   < > 18  --  16  CREATININE 1.09*   < > 0.74  --  0.80  CALCIUM 9.2   < > 8.3*  --  8.3*  MG 1.7  --   --  2.2  --    < > = values in this interval not displayed.   Liver Function Tests No results for input(s): AST, ALT, ALKPHOS, BILITOT, PROT, ALBUMIN in the last 72 hours. No results for input(s): LIPASE, AMYLASE in the last 72 hours. Cardiac Enzymes No results for input(s): CKTOTAL, CKMB, CKMBINDEX, TROPONINI in the last 72 hours.  BNP: BNP (last 3 results) Recent Labs    11/26/19 2011 02/06/20 0725  BNP 1,258.1* 344.3*    ProBNP (last 3 results) No results for input(s): PROBNP in the last 8760 hours.   D-Dimer No results for input(s): DDIMER in the last 72 hours. Hemoglobin A1C No results for input(s): HGBA1C in the last 72 hours.  Fasting Lipid Panel No results for input(s): CHOL, HDL, LDLCALC, TRIG, CHOLHDL, LDLDIRECT in the last 72 hours. Thyroid Function Tests No results for input(s): TSH, T4TOTAL, T3FREE, THYROIDAB in the last 72 hours.  Invalid input(s): FREET3  Other results:   Imaging    No results found.   Medications:     Scheduled Medications: . amiodarone  150 mg Intravenous Once  . apixaban  5 mg Oral BID  . carvedilol  3.125 mg Oral BID WC  . Chlorhexidine Gluconate Cloth  6 each Topical Daily  . cholecalciferol  1,000 Units Oral Daily  . feeding supplement  1 Container Oral TID BM  . fluconazole  100 mg Oral Daily  . folic acid  1 mg Oral Daily  . gabapentin  600 mg Oral BID  . insulin aspart  0-15 Units Subcutaneous TID WC  . insulin aspart  0-5 Units Subcutaneous QHS  . insulin aspart  4 Units Subcutaneous TID WC  . insulin detemir  15 Units Subcutaneous QHS  . mouth rinse  15 mL Mouth Rinse BID  . mexiletine  150 mg Oral BID  . multivitamin with  minerals  1 tablet Oral Daily  . pantoprazole  80 mg Oral Daily  . pravastatin  10 mg Oral Daily  . predniSONE  60 mg Oral Q breakfast  . sodium chloride flush  3 mL Intravenous Q12H  . spironolactone  12.5 mg Oral Daily  . sulfamethoxazole-trimethoprim  1 tablet Oral Once per day on Mon Wed Fri  . venlafaxine XR  150 mg Oral Q breakfast    Infusions: . sodium chloride    . sodium chloride 10 mL/hr at 03/13/20 0700  . amiodarone 30 mg/hr (03/13/20 0700)    PRN Medications: sodium chloride, acetaminophen, albuterol, fentaNYL (SUBLIMAZE) injection, HYDROcodone-acetaminophen, midazolam, ondansetron (ZOFRAN) IV, sodium chloride flush    Patient Profile  Lisa Poole is a 54 year old with a history of h/o HTN, HLD, T2DM, CHB s/p PPM implant 02/2019 paroxysmal atrial fibrillation, cardiac sarcoid, VT, COVID-19infection in April 1610 and systolic heart failure/ nonischemic cardiomyopathy   Assessment/Plan   1. VT: Has Medtronic ICD.  Multiple appropriate shocks for VT. Now back on amiodarone gtt + mexiletine and Coreg.  Still with short slow NSVT runs but no further prolonged VT overnight.  - Continue amiodarone gtt.  - Continue mexiletine - Continue Coreg - Transfer to [facility] for endomyocardial biopsy as below.  2. Chronic systolic CHF with recurrent VT: Nonischemic cardiomyopathy. Echoin 7/21with EF newly decreased to 20-25% with septal-lateral dyssynchrony and septal severe hypokinesis, normal RV, moderate TR, moderate MR. Echo in 5/20 prior to PPM placement showed EF 60-65%. She developed shock in 7/21 requiring pressors/inotropes and IABP.LHC/RHC 7/23/21with no significant CAD, elevated filling pressures, and low cardiac output. cMRI 7/27/21showed patchy LGE in the basal septum, basal-mid inferoseptum/inferior wall, basal anterior wall, mid anterolateral wall. Cardiac MRI LGE pattern could be consistent with cardiac sarcoidosis (would explain earlier CHB). Alternatively, prior  viral myocarditis (recent COVID-19).  CT chest with ground glass lower lobes but no definite evidence for pulmonary sarcoidosis, transbronchial biopsy did not show granulomatous disease. She is now s/p device upgrade to MDT CRT-D.Cardiac PET in 8/21 showed active inflammation in the heart, concerning again for cardiac sarcoidosis. Most recent echo in 9/21 with EF up to 35-40%.  Initially hypotensive and started on phenylephrine in setting of recurrent VT, now stable and off pressors.  Not volume overloaded on exam.  - Continue Coreg at 3.125 mg bid.  -  Can increase spironolactone back to 25 mg daily.  - Holding Entresto for now with relatively soft BP.   3. Cardiac Sarcoid: With unexplained CHB + VT + cardiac MRI and cardiac PET findings, strongly concerned for cardiac sarcoidosis (possibly isolated, she had hilar/mediastinal lymphadenopathy but transbronchial biopsy negative).  With yet another VT storm episode with multiple shocks despite prednisone 60 daily + MTX up to 15 weekly, think we need tissue diagnosis.  Could we be dealing with giant cell myocarditis or some other diagnosis given our inability to control her VT?  - As above, transfer to [facility] for consideration of endomyocardial biopsy.  - For now, continue MTX + prednisone 60 + PJP prophylaxis with Bactrim/folate.  4. H/O CHB: MDT CRT-D device.  5. DMII: Will need SSI.  6. H/O DVT  Loralie Champagne 03/13/2020 7:49 AM

## 2020-03-13 NOTE — Progress Notes (Signed)
Initial Nutrition Assessment  RD working remotely.  DOCUMENTATION CODES:   Not applicable  INTERVENTION:  Will discontinue Boost Breeze.  Provide Ensure Max Protein po once daily, each supplement provides 150 kcal and 30 grams of protein.  NUTRITION DIAGNOSIS:   Increased nutrient needs related to catabolic illness (CHF/NICM) as evidenced by estimated needs.  GOAL:   Patient will meet greater than or equal to 90% of their needs  MONITOR:   PO intake, Supplement acceptance, Labs, Weight trends, I & O's  REASON FOR ASSESSMENT:   Malnutrition Screening Tool    ASSESSMENT:   54 year old female with PMHx of DM, HTN, asthma, complete heart block s/p medtronic PPM now s/p upgrade to ICD in the setting of VT, likely cardiac sarcoidosis, paroxysmal A-fib, hx COVID-19 09/2776, chronic systolic CHF/NICM admitted for ICD shock and found to be in VT storm.   Attempted to call patient over the phone both via room phone and cell phone number listed in chart. Unable to reach patient. Per review of chart patient was upgraded from NPO to clear liquid diet on 11/1 and then on 11/2 was advanced to heart healthy diet. Patient ate 100% of her lunch on 11/2, 100% of her dinner on 11/2, and 75% of her breakfast this AM. In the past 24 hours patient has had approximately 1880 kcal (99% minimum estimated needs) and 75 grams of protein (79% minimum estimated needs). Per review of chart patient has been declining Boost Breeze supplements.  Per review of chart patient's weights fluctuate between 72-79 kg, which could be related to fluid status. Unsure what dry weight may be as unable to reach patient over the phone. Patient was 170 lbs (77.3 kg) on 08/16/2019 (day pt tested positive for COVID). She is currently documented to be 72.1 kg (158.95 lbs). That is a weight loss of 5.2 kg (6.7% body weight) over the past 7 months, which is not significant for time frame. Also unsure how much of that may have been changes  in fluid status. Unable to determine patient's true BMI status at this time as unable to determine dry weight.  Medications reviewed and include: vitamin D3 1000 units daily, Diflucan, folic acid 1 mg daily, Novolog 0-15 units TID with meals, Novolog 0-5 units QHS, Novolog 4 units TID with meals, Levemir 15 units QHS, Protonix, prednisone 60 mg daily, spironolactone, Bactrim once per day on Monday/Wednesday/Friday.  Labs reviewed: CBG [id number].  Unable to determine if patient meets criteria for malnutrition at this time.  Per review of chart plan is for transfer to [facility] for consideration of cardiac biopsy.  NUTRITION - FOCUSED PHYSICAL EXAM:  Unable to complete at this time as RD is working remotely.  Diet Order:   Diet Order            Diet Heart Room service appropriate? Yes; Fluid consistency: Thin  Diet effective now                EDUCATION NEEDS:   No education needs have been identified at this time  Skin:  Skin Assessment: Reviewed RN Assessment  Last BM:  Unknown/PTA  Height:   Ht Readings from Last 1 Encounters:  03/11/20 5' (1.524 m)   Weight:   Wt Readings from Last 1 Encounters:  03/13/20 72.1 kg   BMI:  Body mass index is 31.04 kg/m.  Estimated Nutritional Needs:   Kcal:  1900-2100  Protein:  95-105 grams  Fluid:  1.8 L/day  Jacklynn Barnacle, MS, RD,  LDN Pager number available on Amion

## 2020-03-13 NOTE — Discharge Instructions (Signed)

## 2020-03-13 NOTE — Progress Notes (Signed)
  Checked with [facility] transfer center.   Pt appropriately downgraded to stepdown status for transfer.   Beds still limited, but expected within "next day or two".   Have checked out to afternoon APP. If call comes in the night, will get her out first thing in the am.   Discussed with patient.   Legrand Como [address]" [place], PA-C  03/13/2020 3:31 PM

## 2020-03-13 NOTE — Progress Notes (Signed)
Inpatient Diabetes Program Recommendations  AACE/ADA: New Consensus Statement on Inpatient Glycemic Control (2015)  Target Ranges:  Prepandial:   less than 140 mg/dL      Peak postprandial:   less than 180 mg/dL (1-2 hours)      Critically ill patients:  140 - 180 mg/dL   Lab Results  Component Value Date   GLUCAP 301 (H) 03/13/2020   HGBA1C 10.2 (H) 02/06/2020    Review of Glycemic Control Results for SWATI, GRANBERRY (MRN [id number]) as of 03/13/2020 12:28  Ref. Range 03/12/2020 15:39 03/12/2020 21:14 03/13/2020 06:45 03/13/2020 11:56  Glucose-Capillary Latest Ref Range: 70 - 99 mg/dL 427 (H) 265 (H) 116 (H) 301 (H)  Diabetes history: DM 2 Outpatient Diabetes medications:  Farxiga 10 mg daily, Humalog 5-20 units tid, Levemir 7 units daily Current orders for Inpatient glycemic control:  Novolog moderate tid with meals and HS Prednisone 60 mg daily Levemir 15 units daily Novolog 4 units tid with meals Inpatient Diabetes Program Recommendations:    Consider increasing Novolog meal coverage to 8 units tid with meals.   Thanks,  Adah Perl, RN, BC-ADM Inpatient Diabetes Coordinator Pager [phone] (8a-5p)

## 2020-03-14 MED ORDER — AMIODARONE HCL IN DEXTROSE 360-4.14 MG/200ML-% IV SOLN: 30.0000 mg/h | INTRAVENOUS | Status: DC

## 2020-03-14 MED ORDER — SPIRONOLACTONE 25 MG PO TABS: 25.0000 mg | ORAL_TABLET | Freq: Every day | ORAL | Status: AC

## 2020-03-14 MED ORDER — FLUCONAZOLE 100 MG PO TABS
100.0000 mg | ORAL_TABLET | Freq: Every day | ORAL | Status: DC
Start: 2020-03-14 — End: 2020-04-01

## 2020-03-14 MED ORDER — CARVEDILOL 3.125 MG PO TABS
3.1250 mg | ORAL_TABLET | Freq: Two times a day (BID) | ORAL | Status: DC
Start: 2020-03-14 — End: 2020-04-15

## 2020-03-14 MED ORDER — AMIODARONE LOAD VIA INFUSION: 150.0000 mg | Freq: Once | INTRAVENOUS | 0 refills | Status: DC

## 2020-03-26 ENCOUNTER — Encounter: Payer: Self-pay | Admitting: Internal Medicine

## 2020-03-26 LAB — ACID FAST CULTURE WITH REFLEXED SENSITIVITIES (MYCOBACTERIA): Acid Fast Culture: NEGATIVE

## 2020-03-27 ENCOUNTER — Encounter (HOSPITAL_COMMUNITY): Payer: Self-pay | Admitting: Cardiology

## 2020-03-27 ENCOUNTER — Other Ambulatory Visit (HOSPITAL_COMMUNITY): Payer: Medicare Other

## 2020-03-27 ENCOUNTER — Other Ambulatory Visit: Payer: Self-pay

## 2020-03-27 ENCOUNTER — Ambulatory Visit (HOSPITAL_COMMUNITY)
Admission: RE | Admit: 2020-03-27 | Discharge: 2020-03-27 | Disposition: A | Discharge status: Home / Self Care | Payer: Medicare Other | Source: Ambulatory Visit | Attending: Cardiology | Admitting: Cardiology

## 2020-03-27 VITALS — BP 100/70 | HR 96 | Wt 157.6 lb

## 2020-03-27 DIAGNOSIS — Z7901 Long term (current) use of anticoagulants: Secondary | ICD-10-CM | POA: Insufficient documentation

## 2020-03-27 DIAGNOSIS — Z7952 Long term (current) use of systemic steroids: Secondary | ICD-10-CM | POA: Diagnosis not present

## 2020-03-27 DIAGNOSIS — Z882 Allergy status to sulfonamides status: Secondary | ICD-10-CM | POA: Diagnosis not present

## 2020-03-27 DIAGNOSIS — Z888 Allergy status to other drugs, medicaments and biological substances status: Secondary | ICD-10-CM | POA: Diagnosis not present

## 2020-03-27 DIAGNOSIS — Z950 Presence of cardiac pacemaker: Secondary | ICD-10-CM | POA: Diagnosis not present

## 2020-03-27 DIAGNOSIS — I480 Paroxysmal atrial fibrillation: Secondary | ICD-10-CM | POA: Insufficient documentation

## 2020-03-27 DIAGNOSIS — I472 Ventricular tachycardia, unspecified: Secondary | ICD-10-CM

## 2020-03-27 DIAGNOSIS — Z79899 Other long term (current) drug therapy: Secondary | ICD-10-CM | POA: Diagnosis not present

## 2020-03-27 DIAGNOSIS — I82C11 Acute embolism and thrombosis of right internal jugular vein: Secondary | ICD-10-CM | POA: Insufficient documentation

## 2020-03-27 DIAGNOSIS — Z7984 Long term (current) use of oral hypoglycemic drugs: Secondary | ICD-10-CM | POA: Diagnosis not present

## 2020-03-27 DIAGNOSIS — E785 Hyperlipidemia, unspecified: Secondary | ICD-10-CM | POA: Diagnosis not present

## 2020-03-27 DIAGNOSIS — E119 Type 2 diabetes mellitus without complications: Secondary | ICD-10-CM | POA: Insufficient documentation

## 2020-03-27 DIAGNOSIS — D8685 Sarcoid myocarditis: Secondary | ICD-10-CM

## 2020-03-27 DIAGNOSIS — Z881 Allergy status to other antibiotic agents status: Secondary | ICD-10-CM | POA: Diagnosis not present

## 2020-03-27 DIAGNOSIS — Z8616 Personal history of COVID-19: Secondary | ICD-10-CM | POA: Diagnosis not present

## 2020-03-27 DIAGNOSIS — I5022 Chronic systolic (congestive) heart failure: Secondary | ICD-10-CM | POA: Diagnosis not present

## 2020-03-27 DIAGNOSIS — I428 Other cardiomyopathies: Secondary | ICD-10-CM | POA: Insufficient documentation

## 2020-03-27 DIAGNOSIS — Z880 Allergy status to penicillin: Secondary | ICD-10-CM | POA: Diagnosis not present

## 2020-03-27 DIAGNOSIS — I110 Hypertensive heart disease with heart failure: Secondary | ICD-10-CM | POA: Diagnosis not present

## 2020-03-27 LAB — CBC
HCT: 45 % (ref 36.0–46.0)
Hemoglobin: 14 g/dL (ref 12.0–15.0)
MCH: 31.3 pg (ref 26.0–34.0)
MCHC: 31.1 g/dL (ref 30.0–36.0)
MCV: 100.7 fL — ABNORMAL HIGH (ref 80.0–100.0)
Platelets: 228 10*3/uL (ref 150–400)
RBC: 4.47 MIL/uL (ref 3.87–5.11)
RDW: 22.9 % — ABNORMAL HIGH (ref 11.5–15.5)
WBC: 8.2 10*3/uL (ref 4.0–10.5)
nRBC: 3.3 % — ABNORMAL HIGH (ref 0.0–0.2)

## 2020-03-27 LAB — COMPREHENSIVE METABOLIC PANEL
ALT: 33 U/L (ref 0–44)
AST: 30 U/L (ref 15–41)
Albumin: 3.8 g/dL (ref 3.5–5.0)
Alkaline Phosphatase: 50 U/L (ref 38–126)
Anion gap: 13 (ref 5–15)
BUN: 18 mg/dL (ref 6–20)
CO2: 28 mmol/L (ref 22–32)
Calcium: 10.7 mg/dL — ABNORMAL HIGH (ref 8.9–10.3)
Chloride: 98 mmol/L (ref 98–111)
Creatinine, Ser: 0.84 mg/dL (ref 0.44–1.00)
GFR, Estimated: >60 mL/min (ref >60)
Glucose, Bld: 198 mg/dL — ABNORMAL HIGH (ref 70–99)
Potassium: 4.6 mmol/L (ref 3.5–5.1)
Sodium: 139 mmol/L (ref 135–145)
Total Bilirubin: 0.7 mg/dL (ref 0.3–1.2)
Total Protein: 6 g/dL — ABNORMAL LOW (ref 6.5–8.1)

## 2020-03-27 LAB — TSH: TSH: 2.751 u[IU]/mL (ref 0.350–4.500)

## 2020-03-27 MED ORDER — FUROSEMIDE 20 MG PO TABS: 20.0000 mg | ORAL_TABLET | Freq: Every day | ORAL | 3 refills | Status: DC

## 2020-03-27 NOTE — Progress Notes (Signed)
Advanced Heart Failure Clinic Note   Referring Physician: PCP: Milford Cage, PA PCP-Cardiologist: Dr. Aundra Dubin EP: Dr. Quentin Ore  HPI:  54 y.o.female w/ h/o HTN, HLD, T2DM, CHB s/p PPM implant 02/2019 followed by Dr. Lovena Le, paroxysmal atrial fibrillation,  COVID-19 infection in April 9147 and systolic heart failure/ nonischemic cardiomyopathy.   In May 2020, she underwent w/u for SOB and mildly elevated troponin I (0.09>>0.10>>0.12>>0.15).   LHC 09/2018 showed no significant coronary disease.Therewas minimal systolic bridging of 5 to 10% in the mid LAD. The left circumflex vesselwas normal. The RCA is a dominant vessel with a superior takeoff andwas normal.Echo 09/2018 showed LVEF 60-65%, mild LVH, RV normal. Mild- mod AI, no aortic stenosis.  In 02/2019, she presented w/ chest pain and found to be in CHB and underwentPPMimplant (Medtronic).    In April 2021, she was diagnosed w/ COVID-19 but did not require hospitalization.   She presented to the [facility] in [facility]/21 w/ complaint of progressive dyspnea, intermittent CP, orthopnea, PND and LEE. Symptoms present for several weeks. In ED, found to be in acute CHF.BNP 1,258. CXR w/ developing edema.HS troponin 32>>52>>67.EKG and tele w/ WCT, felt to be VT. Rates in the 160s-170s. Started on IV amiodarone in ED with conversion to NSR. Echo was repeated in 7/21 and showed new drop in LVEF, down to 20-25% w/ regional wall motion abnormalties,most prominent in anterior, septal, and lateral walls. Moderate to severe TR with elevated RVSP. Moderate MR. LV thrombus excluded by definity contrast. RV systolic function normal.  She developed worsening HF/ cardiogenic shock w/ low Co-ox in the 40s, required inotropic support + placement of IABP. IV amiodarone continued for VT suppression. Developed Torsades/VT x 3 on 12/02/19 with prolonged QT required DC-CV. Amio switched to lido and later started on mexiletine. Had repeat LHC in 7/21 that  showed no significant coronary disease. cMRI c/w possible cardiac sarcoidosis vs prior myocarditis. She was placed on empiric prednisone for possible sarcoid w/ plans to refer to [facility] for cardiac PET scan. Of note, chest CT 7/21 with ground glass lower lobes but no definite evidence for pulmonary sarcoidosis. She underwent device upgrade to CRT-D prior to d/c.  She went home on prednisone.   She was admitted in 8/21 with recurrent VT when prednisone was weaned down some.  Prednisone was increased and methotrexate added, home on mexiletine.   PET scan at [facility] in 8/21 was concerning for active cardiac inflammation consistent with cardiac sarcoidosis.   Back again with VT in 9/21, again with taper of prednisone.  Amiodarone was restarted and mexiletine was stopped, prednisone increased again.  Transbronchial biopsy did not show evidence for pulmonary sarcoidosis. Echo in 9/21 showed EF up some to 35-40% with normal RV.  RHC in 10/21 showed low filling pressures and preserved cardiac output.   She was admitted in 11/21 with recurrent VT episodes.  She was started on amiodarone gtt and transferred to [facility] for endomyocardial biopsy for definitive tissue diagnosis of cardiac sarcoidosis.  They repeated a cardiac PET 03/19/20 with active inflammation still present in the heart but improved.  They repeated a cardiac MRI in 11/21 with EF 27%, similar non-coronary LGE as seen on study at [facility].  They felt confident of the diagnosis of cardiac sarcoidosis and there was no further VT, so biopsy and VT ablation were not done.  MTX was increased to 25 mg weekly, and she was started on infliximab infusions.  Rheumatology saw her.  She was eventually discharged home.  She returns today for followup of CHF and cardiac sarcoidosis.  Device interrogation today shows no further sustained VT.  Fluid index > threshold on Optivol though weight is down.  Blood glucose is now running lower despite ongoing prednisone use, has  had readings down to the 40s.  She has had some trouble with balance but does not get lightheaded.  She is not short of breath walking around her house, but she does get short of breath after walking about 100 feet.  No orthopnea/PND.  No chest pain.  She was supposed to have an infliximab infusion this week but has not heard from rheumatology about this.   Medtronic device interrogation: No AF, no VT since hospital discharge.  Thoracic impedance decreased with fluid index > threshold.   Labs (11/21): K 5.4, creatinine 0.8   Review of Systems: All systems reviewed and negative except as per HPI.   PMH: 1. HTN 2. Hyperlipidemia 3. Type 2 diabetes 4. Complete heart block: Suspect due to cardiac sarcoidosis. 10/20 MDT PPM placement, upgraded later to CRT-D.  5. VT: Recurrent.  Suspected due to cardiac sarcoidosis.  - Had MDT CRT-D device.  - Has history of torsades  6. Cardiac sarcoidosis: Suspected isolated cardiac sarcoidosis.   - Transbronchial biopsy in 10/21 was negative for granulomatous disease.  - Cardiac PET (8/21): Active cardiac inflammation.  - Cardiac MRI (7/21): Patchy LGE in the basal septum, basal-mid inferoseptum/inferior wall, basal anterior wall, mid anterolateral wall. Cardiac MRI LGE pattern could be consistent with cardiac sarcoidosis - Cardiac PET (11/21, [facility]): Still with active inflammation but improved from 8/21.  - Cardiac MRI (11/21, [facility]): LV EF 27%, RV EF 40%, multiple areas of septal and lateral wall non-coronary-type LGE.  7. Chronic systolic CHF: Nonischemic cardiomyopathy, suspect cardiac sarcoidosis.  - Echo (7/21): EF newly decreased to 20-25% with septal-lateral dyssynchrony and septal severe hypokinesis, normal RV, moderate TR, moderate MR.  - LHC/RHC 7/23/21with no significant CAD, elevated filling pressures, and low cardiac output - Echo (9/21): EF 35-40%, mild LVE, normal RV.  - RHC (10/21): mean RA 1, PA 13/5, mean PCWP 3, CI 2.79 - Echo (11/21,  [facility]): EF 35%, moderate MR, mild-moderate AI.  - Cardiac MRI (11/21, [facility]): LV EF 27%, RV EF 40%, multiple areas of septal and lateral wall non-coronary-type LGE.  8. COVID-19 infection 9. DVT: right IJ DVT 8/21 in setting of central line.    Current Outpatient Medications  Medication Sig Dispense Refill  . acetaminophen (TYLENOL) 325 MG tablet Take 2 tablets (650 mg total) by mouth every 4 (four) hours as needed for headache or mild pain.    Marland Kitchen albuterol (PROVENTIL HFA;VENTOLIN HFA) 108 (90 Base) MCG/ACT inhaler Inhale 1-2 puffs into the lungs every 6 (six) hours as needed for wheezing or shortness of breath. 1 Inhaler 0  . amiodarone (PACERONE) 200 MG tablet Take 200 mg by mouth daily.    Marland Kitchen apixaban (ELIQUIS) 5 MG TABS tablet Take 1 tablet (5 mg total) by mouth 2 (two) times daily. 60 tablet 3  . Biotin 10 MG CAPS Take 10 mg by mouth daily.    . carvedilol (COREG) 3.125 MG tablet Take 1 tablet (3.125 mg total) by mouth 2 (two) times daily with a meal.    . Cholecalciferol (VITAMIN D-3) 25 MCG (1000 UT) CAPS Take 1,000 Units by mouth daily.     Marland Kitchen FARXIGA 10 MG TABS tablet TAKE 1 TABLET(10 MG) BY MOUTH DAILY (Patient taking differently: Take 10 mg by mouth  daily. ) 30 tablet 5  . fluconazole (DIFLUCAN) 100 MG tablet Take 1 tablet (100 mg total) by mouth daily.    . folic acid (FOLVITE) 1 MG tablet Take 1 tablet (1 mg total) by mouth daily. 30 tablet 6  . gabapentin (NEURONTIN) 600 MG tablet Take 600 mg by mouth 2 (two) times daily.    Marland Kitchen glucose blood (COOL BLOOD GLUCOSE TEST STRIPS) test strip Use as instructed 100 each 12  . metFORMIN (GLUCOPHAGE) 1000 MG tablet Take 1 tablet (1,000 mg total) by mouth 2 (two) times daily with a meal. 180 tablet 3  . mexiletine (MEXITIL) 150 MG capsule Take 1 capsule (150 mg total) by mouth 2 (two) times daily. 60 capsule 11  . Multiple Vitamins-Minerals (CENTRUM SILVER 50+WOMEN) TABS Take 1 tablet by mouth daily.    Marland Kitchen omeprazole (PRILOSEC) 40 MG capsule Take  40 mg by mouth daily.    . pravastatin (PRAVACHOL) 10 MG tablet Take 10 mg by mouth daily.    . predniSONE (DELTASONE) 20 MG tablet Take 3 tablets (60 mg total) by mouth daily with breakfast. 90 tablet 5  . spironolactone (ALDACTONE) 25 MG tablet Take 1 tablet (25 mg total) by mouth daily.    Marland Kitchen sulfamethoxazole-trimethoprim (BACTRIM DS) 800-160 MG tablet Take 1 tablet by mouth 3 (three) times a week. 30 tablet 5  . venlafaxine XR (EFFEXOR-XR) 150 MG 24 hr capsule Take 150 mg by mouth daily with breakfast.    . furosemide (LASIX) 20 MG tablet Take 1 tablet (20 mg total) by mouth daily. 30 tablet 3  . glipiZIDE (GLUCOTROL) 5 MG tablet Take 0.5 tablets (2.5 mg total) by mouth daily before breakfast. 60 tablet 11  . methotrexate (RHEUMATREX) 2.5 MG tablet Take 1 tablet (2.5 mg total) by mouth once a week. Caution:Chemotherapy. Protect from light. 4 tablet 0   No current facility-administered medications for this encounter.    Allergies  Allergen Reactions  . Lisinopril Other (See Comments) and Cough    Flu like symptoms, also   . Ace Inhibitors Other (See Comments)    Flu-like symptoms  . Augmentin [Amoxicillin-Pot Clavulanate] Nausea And Vomiting  . Benazepril Other (See Comments)    Flu like symptoms  . Oxycodone-Acetaminophen Nausea And Vomiting  . Sulfa Antibiotics Nausea And Vomiting      Social History   Socioeconomic History  . Marital status: Married    Spouse name: Not on file  . Number of children: Not on file  . Years of education: Not on file  . Highest education level: Not on file  Occupational History  . Not on file  Tobacco Use  . Smoking status: Never Smoker  . Smokeless tobacco: Never Used  Vaping Use  . Vaping Use: Never used  Substance and Sexual Activity  . Alcohol use: No  . Drug use: No  . Sexual activity: Yes    Birth control/protection: Post-menopausal  Other Topics Concern  . Not on file  Social History Narrative  . Not on file   Social  Determinants of Health   Financial Resource Strain:   . Difficulty of Paying Living Expenses: Not on file  Food Insecurity:   . Worried About Charity fundraiser in the Last Year: Not on file  . Ran Out of Food in the Last Year: Not on file  Transportation Needs:   . Lack of Transportation (Medical): Not on file  . Lack of Transportation (Non-Medical): Not on file  Physical Activity:   .  Days of Exercise per Week: Not on file  . Minutes of Exercise per Session: Not on file  Stress:   . Feeling of Stress : Not on file  Social Connections:   . Frequency of Communication with Friends and Family: Not on file  . Frequency of Social Gatherings with Friends and Family: Not on file  . Attends Religious Services: Not on file  . Active Member of Clubs or Organizations: Not on file  . Attends Archivist Meetings: Not on file  . Marital Status: Not on file  Intimate Partner Violence:   . Fear of Current or Ex-Partner: Not on file  . Emotionally Abused: Not on file  . Physically Abused: Not on file  . Sexually Abused: Not on file      Family History  Problem Relation Age of Onset  . Colon cancer Maternal Grandmother   . Alzheimer's disease Mother     Vitals:   03/27/20 1107  BP: 100/70  Pulse: 96  SpO2: 95%  Weight: 71.5 kg (157 lb 9.6 oz)   PHYSICAL EXAM: General: NAD Neck: Thick, JVP difficult but suspect around 8 cm, no thyromegaly or thyroid nodule.  Lungs: Clear to auscultation bilaterally with normal respiratory effort. CV: Nondisplaced PMI.  Heart regular S1/S2, no S3/S4, no murmur.  No peripheral edema.  No carotid bruit.  Normal pedal pulses.  Abdomen: Soft, nontender, no hepatosplenomegaly, no distention.  Skin: Intact without lesions or rashes.  Neurologic: Alert and oriented x 3.  Psych: Normal affect. Extremities: No clubbing or cyanosis.  HEENT: Normal.   ASSESSMENT & PLAN: 1.Chronicsystolic CHF: Nonischemic cardiomyopathy. Echoin 7/21with EF  newly decreased to 20-25% with septal-lateral dyssynchrony and septal severe hypokinesis, normal RV, moderate TR, moderate MR. Echo in 5/20 prior to PPM placement showed EF 60-65%. She developed shock in 7/21 requiring pressors/inotropes and IABP.LHC/RHC 7/23/21with no significant CAD, elevated filling pressures, and low cardiac output. cMRI 12/05/19 and repeated 11/21showed patchy LGE in the basal septum, basal-mid inferoseptum/inferior wall, basal anterior wall, mid anterolateral wall. Cardiac MRI LGE pattern could be consistent with cardiac sarcoidosis (would explain earlier CHB). Alternatively, prior viral myocarditis (recent COVID-19). CT chest with ground glass lower lobes but no evidence for pulmonary sarcoidosis, transbronchial biopsy did not show granulomatous disease. She is now s/p device upgrade to MDT CRT-D.Cardiac PET in 8/21 was showed active inflammation in the heart, concerning again for cardiac sarcoidosis. Repeat PET in 11/21 showed ongoing inflammation but improved. Most recent echo in 11/21 with EF 35%, but cMRI in 11/21 showed LV EF 27%. Exam difficult for volume but Optivol shows fluid index > threshold.  NYHA class III symptoms.    - She continued spironolactone (was supposed to be off with high K at [facility]).  Will have her continue for now and check BMET today.  -Hold off on restarting Entresto for now until we see what K is running and get her BP running a little higher.  -Continue Coreg 3.125 mg bid.  -Continuedapagliflozin 10 mg daily. - Add Lasix 20 mg daily with BMET in 10 days.  - I will refer her for cardiac rehab with low EF (27% by MRI) and deconditioning.  2. Cardiac sarcoidosis: Cardiac MRI and cardiac PET both suggestive of cardiac sarcoidosis, and unexplained CHB also concerning, as is VT.  She has had VT with prednisone weaning.  Suspect isolated cardiac sarcoidosis.  Transbronchial biopsy did not show granulomatous disease.  Seen at [facility], they agree with  diagnosis. Repeat cardiac PET in  11/21 showed cardiac inflammation still present but improved.  -Continue prednisone 60 mg daily, decrease to 50 mg daily in about a month from now with very slow taper.  - Continue methotrexate once a week, she is on goal dose 25 mg weekly.  Will need to follow CBC, LFTs monthly. - She is on Bactrim for PJP prophylaxis and folate. Follow K and creatinine closely.  - She is now on infliximab per rheumatology at [facility].  She is due for an infusion this week.  They have not contacted her to arrange this.  I will see if I can facilitate this.  - She is going to be followed in the sarcoidosis clinic at [facility].  3.Recurrent ventricular tachycardia:She has hadVT with multiple morphologies. No coronary disease on recent cath. Suspect as abovethat she has cardiac sarcoidosis (would also explain CHB). Amiodarone stopped in the past due to torsades and very long QTc on amiodarone.  She has tolerated restart of amiodarone.  She is also on prednisone as above and has had increased VT when prednisone was tapered.   -She will continue amiodarone, she has had a good load at this point.  With balance issues that may be related to amiodarone use, will decrease dose to 200 mg daily for maintenance.  She will need LFTs, TSH today. She will need a regular eye exam with amiodarone use.  -Coreg 3.125 mg bid.  - Continue mexiletine 150 mg bid, can increase if needed.   -She will need very slow prednisone taperwith addition of MTX and infliximab for cardiac sarcoidosis treatment, as above. 4. H/o HTD:SKAJGOT due to cardiac sarcoidosis. Now s/p CRT.  5. Rt IJ DVT: likely from prior central line.  - Will needanticoagulationfor 3 months=>Eliquisuntil around 12/21.   6. Type 2 diabetes: Glucose had been high on steroids, now running lower. She has long-standing diabetes.  -Continue metformin 1000 mg bid.  - She is on Farxiga 10 daily.  - She can decrease glipizide to 2.5 mg  daily.  - Needs close followup with her endocrinologist.   Followup in 3 wks with me. Will see if we can restart Entresto at that time.   Loralie Champagne, MD 03/27/20

## 2020-03-27 NOTE — Patient Instructions (Signed)
Decrease Glipizide to 2.5mg  daily  Decrease Amiodarone to 200mg  daily  Start Lasix 20mg  daily  Routine lab work today. Will notify you of abnormal results  Repeat labs in 10days  Follow up in 3 weeks  You have been referred to cardiac rehab  (they will contact you to schedule your appointment)  Please call Endocrinology to discuss your low blood sugar

## 2020-04-01 ENCOUNTER — Encounter (HOSPITAL_COMMUNITY): Payer: Self-pay | Admitting: *Deleted

## 2020-04-01 ENCOUNTER — Other Ambulatory Visit: Payer: Self-pay

## 2020-04-01 ENCOUNTER — Encounter: Payer: Self-pay | Admitting: Cardiology

## 2020-04-01 ENCOUNTER — Ambulatory Visit (INDEPENDENT_AMBULATORY_CARE_PROVIDER_SITE_OTHER): Payer: Medicare Other | Admitting: Cardiology

## 2020-04-01 VITALS — BP 120/80 | HR 94 | Ht 60.0 in | Wt 158.0 lb

## 2020-04-01 DIAGNOSIS — I480 Paroxysmal atrial fibrillation: Secondary | ICD-10-CM | POA: Diagnosis not present

## 2020-04-01 DIAGNOSIS — Z95810 Presence of automatic (implantable) cardiac defibrillator: Secondary | ICD-10-CM | POA: Diagnosis not present

## 2020-04-01 DIAGNOSIS — I472 Ventricular tachycardia, unspecified: Secondary | ICD-10-CM

## 2020-04-01 DIAGNOSIS — I442 Atrioventricular block, complete: Secondary | ICD-10-CM

## 2020-04-01 LAB — CUP PACEART INCLINIC DEVICE CHECK
Brady Statistic AP VP Percent: 4.8 %
Brady Statistic AP VS Percent: 0.1 % — CL
Brady Statistic AS VP Percent: 84.5 %
Brady Statistic AS VS Percent: 10.6 %
Date Time Interrogation Session: 20211122153219
Implantable Lead Implant Date: 20201005
Implantable Lead Implant Date: 20201005
Implantable Lead Implant Date: 20210729
Implantable Lead Location: 753859
Implantable Lead Location: 753860
Implantable Lead Location: 753860
Implantable Lead Model: 3830
Implantable Lead Model: 5076
Implantable Lead Model: 6935
Implantable Pulse Generator Implant Date: 20210729
Lead Channel Pacing Threshold Amplitude: 0.25 V
Lead Channel Pacing Threshold Amplitude: 0.5 V
Lead Channel Pacing Threshold Amplitude: 0.75 V
Lead Channel Pacing Threshold Pulse Width: 0.4 ms
Lead Channel Pacing Threshold Pulse Width: 0.4 ms
Lead Channel Pacing Threshold Pulse Width: 0.4 ms
Lead Channel Sensing Intrinsic Amplitude: 2.1 mV
Lead Channel Sensing Intrinsic Amplitude: 20 mV

## 2020-04-01 NOTE — Progress Notes (Signed)
Received second/new referral for this pt to participate in cardiac rehab.  Pt original referral after her first cardiac event in July.  Rehab staff followed along and ultimately the referral was closed due to pt ongoing medical complications. Reviewed office visit with Dr. Aundra Dubin on 11/17. Also reviewed notes in [place] from [place].  Pt has upcoming initial consult appt on 11/24.  Will hold on scheduling until this appt has been completed and plan of care determined for her diagnosis of Cardiac Sarcoidosis for readiness to participate in group exercise. Will send the medicaid reimbursement form for completion and have support staff verify insurance and place in the 11/24 follow up folder. Cherre Huger, BSN Cardiac and Training and development officer

## 2020-04-01 NOTE — Progress Notes (Signed)
Electrophysiology Office Follow up Visit Note:    Date:  04/01/2020   ID:  Lisa Poole, DOB March 09, 1966, MRN [id number]  PCP:  Milford Cage, PA  [facility] Cardiologist:  No primary care provider on file.  [facility] Electrophysiologist:  Vickie Epley, MD    Interval History:    Lisa Poole is a 54 y.o. female who presents for a follow up visit.  Lisa Poole recently was seen at [facility] where she was hospitalized for her cardiac sarcoidosis and ventricular tachycardia.  While admitted, repeat PET scan showed improved inflammation within the myocardium consistent with partially treated cardiac sarcoidosis.  Transbronchial biopsy did not show evidence of sarcoid.  Patient tells me since discharge she has good days and bad days.  Some days she has trouble getting out of bed.  Her husband is very involved in her care and with her today.  He encourages her to do some walking each Lisa.  No problems with her medications.  She is awaiting a phone call from [facility] rheumatology regarding her infliximab infusions.  No ICD shocks since leaving [facility].     Past Medical History:  Diagnosis Date  . AICD (automatic cardioverter/defibrillator) present   . Aortic insufficiency   . Asthma 09/13/2006  . Complete heart block ([place]) 02/11/2019  . Cystocele without uterine prolapse 12/12/2018  . Diabetes mellitus without complication ([place])   . Elevated troponin level   . Hypercholesteremia   . Hypertension   . Presence of permanent cardiac pacemaker   . Primary osteoarthritis of left knee 12/10/2017    Past Surgical History:  Procedure Laterality Date  . APPENDECTOMY    . BIOPSY  02/08/2020   Procedure: BIOPSY;  Surgeon: Juanito Doom, MD;  Location: [facility] ENDOSCOPY;  Service: Cardiopulmonary;;  . BIV ICD INSERTION CRT-D N/A 12/07/2019   Procedure: BIV ICD INSERTION CRT-D;  Surgeon: Evans Lance, MD;  Location: [place] CV LAB;  Service: Cardiovascular;  Laterality: N/A;  .  BRONCHIAL WASHINGS  02/08/2020   Procedure: BRONCHIAL WASHINGS;  Surgeon: Juanito Doom, MD;  Location: [facility] ENDOSCOPY;  Service: Cardiopulmonary;;  . IABP INSERTION N/A 12/01/2019   Procedure: IABP Insertion;  Surgeon: Larey Dresser, MD;  Location: [place] CV LAB;  Service: Cardiovascular;  Laterality: N/A;  . INSERT / REPLACE / REMOVE PACEMAKER    . LEFT HEART CATH AND CORONARY ANGIOGRAPHY N/A 09/28/2018   Procedure: LEFT HEART CATH AND CORONARY ANGIOGRAPHY;  Surgeon: Troy Sine, MD;  Location: [place] CV LAB;  Service: Cardiovascular;  Laterality: N/A;  . PACEMAKER IMPLANT N/A 02/13/2019   Procedure: PACEMAKER IMPLANT;  Surgeon: Evans Lance, MD;  Location: [place] CV LAB;  Service: Cardiovascular;  Laterality: N/A;  . PACEMAKER IMPLANT    . RIGHT HEART CATH N/A 02/09/2020   Procedure: RIGHT HEART CATH;  Surgeon: Larey Dresser, MD;  Location: [place] CV LAB;  Service: Cardiovascular;  Laterality: N/A;  . RIGHT/LEFT HEART CATH AND CORONARY ANGIOGRAPHY N/A 12/01/2019   Procedure: RIGHT/LEFT HEART CATH AND CORONARY ANGIOGRAPHY;  Surgeon: Larey Dresser, MD;  Location: [place] CV LAB;  Service: Cardiovascular;  Laterality: N/A;  . ROTATOR CUFF REPAIR Bilateral 2017  . SINUS EXPLORATION    . TOTAL KNEE ARTHROPLASTY Left 12/10/2017   Procedure: LEFT TOTAL KNEE ARTHROPLASTY;  Surgeon: Sydnee Cabal, MD;  Location: [facility] ORS;  Service: Orthopedics;  Laterality: Left;  Adductor Block  . TUBAL LIGATION    . VIDEO BRONCHOSCOPY  N/A 02/08/2020   Procedure: VIDEO BRONCHOSCOPY WITH FLUORO;  Surgeon: Juanito Doom, MD;  Location: [place];  Service: Cardiopulmonary;  Laterality: N/A;    Current Medications: Current Meds  Medication Sig  . acetaminophen (TYLENOL) 325 MG tablet Take 2 tablets (650 mg total) by mouth every 4 (four) hours as needed for headache or mild pain.  Marland Kitchen albuterol (PROVENTIL HFA;VENTOLIN HFA) 108 (90 Base) MCG/ACT inhaler Inhale 1-2 puffs into  the lungs every 6 (six) hours as needed for wheezing or shortness of breath.  Marland Kitchen amiodarone (PACERONE) 200 MG tablet Take 200 mg by mouth daily.  Marland Kitchen apixaban (ELIQUIS) 5 MG TABS tablet Take 1 tablet (5 mg total) by mouth 2 (two) times daily.  . Biotin 10 MG CAPS Take 10 mg by mouth daily.  . carvedilol (COREG) 3.125 MG tablet Take 1 tablet (3.125 mg total) by mouth 2 (two) times daily with a meal.  . Cholecalciferol (VITAMIN D-3) 25 MCG (1000 UT) CAPS Take 1,000 Units by mouth daily.   Marland Kitchen ENTRESTO 49-51 MG Take 1 tablet by mouth 2 (two) times daily. Hold  . FARXIGA 10 MG TABS tablet TAKE 1 TABLET(10 MG) BY MOUTH DAILY  . folic acid (FOLVITE) 1 MG tablet Take 1 tablet (1 mg total) by mouth daily.  . furosemide (LASIX) 20 MG tablet Take 1 tablet (20 mg total) by mouth daily.  Marland Kitchen gabapentin (NEURONTIN) 600 MG tablet Take 600 mg by mouth 2 (two) times daily.  Marland Kitchen glipiZIDE (GLUCOTROL) 5 MG tablet Take 0.5 tablets (2.5 mg total) by mouth daily before breakfast.  . glucose blood (COOL BLOOD GLUCOSE TEST STRIPS) test strip Use as instructed  . HYDROcodone-acetaminophen (NORCO) 10-325 MG tablet Take by mouth as needed.  . metFORMIN (GLUCOPHAGE) 1000 MG tablet Take 1 tablet (1,000 mg total) by mouth 2 (two) times daily with a meal.  . methotrexate (RHEUMATREX) 2.5 MG tablet Take 1 tablet (2.5 mg total) by mouth once a week. Caution:Chemotherapy. Protect from light.  . mexiletine (MEXITIL) 150 MG capsule Take 1 capsule (150 mg total) by mouth 2 (two) times daily.  . Multiple Vitamins-Minerals (CENTRUM SILVER 50+WOMEN) TABS Take 1 tablet by mouth daily.  Marland Kitchen omeprazole (PRILOSEC) 40 MG capsule Take 40 mg by mouth daily.  . pravastatin (PRAVACHOL) 10 MG tablet Take 10 mg by mouth daily.  . predniSONE (DELTASONE) 20 MG tablet Take 3 tablets (60 mg total) by mouth daily with breakfast.  . spironolactone (ALDACTONE) 25 MG tablet Take 1 tablet (25 mg total) by mouth daily.  Marland Kitchen sulfamethoxazole-trimethoprim (BACTRIM DS)  800-160 MG tablet Take 1 tablet by mouth 3 (three) times a week.  . venlafaxine XR (EFFEXOR-XR) 150 MG 24 hr capsule Take 150 mg by mouth daily with breakfast.     Allergies:   Lisinopril, Ace inhibitors, Augmentin [amoxicillin-pot clavulanate], Benazepril, Oxycodone-acetaminophen, and Sulfa antibiotics   Social History   Socioeconomic History  . Marital status: Married    Spouse name: Not on file  . Number of children: Not on file  . Years of education: Not on file  . Highest education level: Not on file  Occupational History  . Not on file  Tobacco Use  . Smoking status: Never Smoker  . Smokeless tobacco: Never Used  Vaping Use  . Vaping Use: Never used  Substance and Sexual Activity  . Alcohol use: No  . Drug use: No  . Sexual activity: Yes    Birth control/protection: Post-menopausal  Other Topics Concern  . Not on  file  Social History Narrative  . Not on file   Social Determinants of Health   Financial Resource Strain:   . Difficulty of Paying Living Expenses: Not on file  Food Insecurity:   . Worried About Charity fundraiser in the Last Year: Not on file  . Ran Out of Food in the Last Year: Not on file  Transportation Needs:   . Lack of Transportation (Medical): Not on file  . Lack of Transportation (Non-Medical): Not on file  Physical Activity:   . Days of Exercise per Week: Not on file  . Minutes of Exercise per Session: Not on file  Stress:   . Feeling of Stress : Not on file  Social Connections:   . Frequency of Communication with Friends and Family: Not on file  . Frequency of Social Gatherings with Friends and Family: Not on file  . Attends Religious Services: Not on file  . Active Member of Clubs or Organizations: Not on file  . Attends Archivist Meetings: Not on file  . Marital Status: Not on file     Family History: The patient's family history includes Alzheimer's disease in her mother; Colon cancer in her maternal  grandmother.  ROS:   Please see the history of present illness.    All other systems reviewed and are negative.  EKGs/Labs/Other Studies Reviewed:    The following studies were reviewed today: [facility] discharge summary  EKG today shows atrial sensed biventricular paced rhythm with frequent monomorphic PVCs.  PVCs have a left superior axis and a precordial negative concordance.   In clinic device interrogation personally reviewed Lisa Poole 6.2 years DDD 70-1 20 Atrial lead impedance 361 ohms, capture threshold 0.75 at 0.4, P wave 4.4 Right ventricular lead impedance 589 ohms, capture threshold 0.50.4, R waves 13.8 LV lead impedance 456 ohms, capture threshold 0.875 0.4 265 nonsustained VT events Effective biventricular pacing 79% due to frequent PVCs and nonsustained VT  Recent Labs: 02/06/2020: B Natriuretic Peptide 344.3 03/12/2020: Magnesium 2.2 03/27/2020: ALT 33; BUN 18; Creatinine, Ser 0.84; Hemoglobin 14.0; Platelets 228; Potassium 4.6; Sodium 139; TSH 2.751  Recent Lipid Panel    Component Value Date/Time   CHOL 178 09/28/2018 0228   TRIG 91 09/28/2018 0228   HDL 71 09/28/2018 0228   CHOLHDL 2.5 09/28/2018 0228   VLDL 18 09/28/2018 0228   LDLCALC 89 09/28/2018 0228    Physical Exam:    VS:  BP 120/80   Pulse 94   Ht 5' (1.524 m)   Wt 158 lb (71.7 kg)   LMP 07/25/2015 (Exact Date)   SpO2 97%   BMI 30.86 kg/m     Wt Readings from Last 3 Encounters:  04/01/20 158 lb (71.7 kg)  03/27/20 157 lb 9.6 oz (71.5 kg)  03/13/20 158 lb 15.2 oz (72.1 kg)     GEN: Chronically ill-appearing HEENT: Normal NECK: No JVD; No carotid bruits LYMPHATICS: No lymphadenopathy CARDIAC: RRR, no murmurs, rubs, gallops RESPIRATORY:  Clear to auscultation without rales, wheezing or rhonchi  ABDOMEN: Soft, non-tender, non-distended MUSCULOSKELETAL:  No edema; No deformity  SKIN: Warm and dry NEUROLOGIC:  Alert and oriented x 3 PSYCHIATRIC:  Normal affect   ASSESSMENT:    1.  Complete heart block ([place])   2. Ventricular tachycardia ([place])   3. ICD (implantable cardioverter-defibrillator) in place    PLAN:    In order of problems listed above:  1. Cardiac sarcoidosis complicated by complete heart block and ventricular tachycardia Patient was  recently evaluated at [facility].  There, repeat PET scan showed improvement in her myocardial inflammation.  Infliximab was started.  For the ventricular tachycardia, continue amiodarone 200 mg by mouth daily and mexiletine 150 mg twice daily  For the associated heart failure, continue carvedilol 3.125 twice daily, Lasix 20 mg daily, spironolactone 25 mg daily.  We will need to discuss with heart failure whether not we should restart the Entresto.  For the immunosuppression for the cardiac sarcoid we will continue Bactrim, methotrexate, prednisone, folic acid and infliximab infusions ([facility] rheumatology).  She has an appointment with Dr.Karra with [facility] sarcoid center scheduled.  2.  Paroxysmal atrial fibrillation Continue Eliquis  Medication Adjustments/Labs and Tests Ordered: Current medicines are reviewed at length with the patient today.  Concerns regarding medicines are outlined above.  Orders Placed This Encounter  Procedures  . CUP PACEART [place]  . EKG 12-Lead   No orders of the defined types were placed in this encounter.    Signed, Lars Mage, MD, [facility]  04/01/2020 3:56 PM    Electrophysiology [place]

## 2020-04-01 NOTE — Patient Instructions (Addendum)
Medication Instructions:  Your physician recommends that you continue on your current medications as directed. Please refer to the Current Medication list given to you today.  Labwork: None ordered.  Testing/Procedures: None ordered.  Follow-Up: Your physician wants you to follow-up in: 3 months with Dr. Quentin Ore.     June 25, 2020 at 3:15 pm  Remote monitoring is used to monitor your ICD from home. This monitoring reduces the number of office visits required to check your device to one time per year. It allows [place] to keep an eye on the functioning of your device to ensure it is working properly. You are scheduled for a device check from home on 06/07/2020. You may send your transmission at any time that day. If you have a wireless device, the transmission will be sent automatically. After your physician reviews your transmission, you will receive a postcard with your next transmission date.  Any Other Special Instructions Will Be Listed Below (If Applicable).  If you need a refill on your cardiac medications before your next appointment, please call your pharmacy.

## 2020-04-02 ENCOUNTER — Encounter (HOSPITAL_COMMUNITY): Payer: Medicare Other | Admitting: Cardiology

## 2020-04-08 ENCOUNTER — Other Ambulatory Visit: Payer: Self-pay

## 2020-04-08 ENCOUNTER — Ambulatory Visit (HOSPITAL_COMMUNITY)
Admission: RE | Admit: 2020-04-08 | Discharge: 2020-04-08 | Disposition: A | Discharge status: Home / Self Care | Payer: Medicare Other | Source: Ambulatory Visit | Attending: Cardiology | Admitting: Cardiology

## 2020-04-08 DIAGNOSIS — I5022 Chronic systolic (congestive) heart failure: Secondary | ICD-10-CM | POA: Insufficient documentation

## 2020-04-08 LAB — BASIC METABOLIC PANEL
Anion gap: 13 (ref 5–15)
BUN: 37 mg/dL — ABNORMAL HIGH (ref 6–20)
CO2: 25 mmol/L (ref 22–32)
Calcium: 9.5 mg/dL (ref 8.9–10.3)
Chloride: 94 mmol/L — ABNORMAL LOW (ref 98–111)
Creatinine, Ser: 1.31 mg/dL — ABNORMAL HIGH (ref 0.44–1.00)
GFR, Estimated: 48 mL/min — ABNORMAL LOW (ref >60)
Glucose, Bld: 308 mg/dL — ABNORMAL HIGH (ref 70–99)
Potassium: 5 mmol/L (ref 3.5–5.1)
Sodium: 132 mmol/L — ABNORMAL LOW (ref 135–145)

## 2020-04-10 ENCOUNTER — Telehealth (HOSPITAL_COMMUNITY): Payer: Self-pay

## 2020-04-10 MED ORDER — GLIPIZIDE 5 MG PO TABS: 5.0000 mg | ORAL_TABLET | Freq: Every day | ORAL | 11 refills | Status: AC

## 2020-04-10 MED ORDER — FUROSEMIDE 20 MG PO TABS: 20.0000 mg | ORAL_TABLET | ORAL | 3 refills | Status: AC

## 2020-04-10 NOTE — Telephone Encounter (Signed)
-----   Message from Larey Dresser, MD sent at 04/08/2020 10:39 PM EST ----- 1. Hold Lasix for 1 day then decrease to 20 mg every other day.  2. Low K diet, no K supplement.  3. Increase glipizide back to 5 mg daily.

## 2020-04-10 NOTE — Telephone Encounter (Signed)
Spoke with patient, advised of results of blood work. Advised of medication changes and dietary instructions. Pt verbalized understanding of instructions.

## 2020-04-12 ENCOUNTER — Telehealth (HOSPITAL_COMMUNITY): Payer: Self-pay

## 2020-04-12 NOTE — Telephone Encounter (Signed)
Pt insurance is active and benefits verified through [facility] Medicare. Co-pay $0.00, DED $0.00/$0.00 met, out of pocket $7,550.00/$1,573.36 met, co-insurance 20%. No pre-authorization required. Passport, 04/12/20 @ 10:19AM, EHM#[id number]  Pt insurance is active through Medicaid. MLY#[id number]  Will fax over Medicaid Reimbursement form to Dr. Aundra Dubin  Will contact patient to see if she is interested in the Cardiac Rehab Program.

## 2020-04-12 NOTE — Telephone Encounter (Signed)
Called patient to see if she is interested in the Cardiac Rehab Program. Patient expressed interest. Explained scheduling process and went over insurance, patient verbalized understanding.  

## 2020-04-15 ENCOUNTER — Inpatient Hospital Stay (HOSPITAL_COMMUNITY)
Admission: EM | Admit: 2020-04-15 | Discharge: 2020-05-11 | DRG: 166 | Disposition: E | Payer: Medicare Other | Attending: Internal Medicine | Admitting: Internal Medicine

## 2020-04-15 ENCOUNTER — Other Ambulatory Visit: Payer: Self-pay

## 2020-04-15 ENCOUNTER — Emergency Department (HOSPITAL_COMMUNITY): Payer: Medicare Other

## 2020-04-15 DIAGNOSIS — R4182 Altered mental status, unspecified: Secondary | ICD-10-CM | POA: Diagnosis not present

## 2020-04-15 DIAGNOSIS — Z20822 Contact with and (suspected) exposure to covid-19: Secondary | ICD-10-CM | POA: Diagnosis present

## 2020-04-15 DIAGNOSIS — I5023 Acute on chronic systolic (congestive) heart failure: Secondary | ICD-10-CM | POA: Diagnosis not present

## 2020-04-15 DIAGNOSIS — D8689 Sarcoidosis of other sites: Principal | ICD-10-CM | POA: Diagnosis present

## 2020-04-15 DIAGNOSIS — D692 Other nonthrombocytopenic purpura: Secondary | ICD-10-CM | POA: Diagnosis present

## 2020-04-15 DIAGNOSIS — K5900 Constipation, unspecified: Secondary | ICD-10-CM | POA: Diagnosis present

## 2020-04-15 DIAGNOSIS — I110 Hypertensive heart disease with heart failure: Secondary | ICD-10-CM | POA: Diagnosis present

## 2020-04-15 DIAGNOSIS — M549 Dorsalgia, unspecified: Secondary | ICD-10-CM | POA: Diagnosis present

## 2020-04-15 DIAGNOSIS — R570 Cardiogenic shock: Secondary | ICD-10-CM | POA: Diagnosis not present

## 2020-04-15 DIAGNOSIS — E7800 Pure hypercholesterolemia, unspecified: Secondary | ICD-10-CM | POA: Diagnosis present

## 2020-04-15 DIAGNOSIS — Z86718 Personal history of other venous thrombosis and embolism: Secondary | ICD-10-CM

## 2020-04-15 DIAGNOSIS — Z8679 Personal history of other diseases of the circulatory system: Secondary | ICD-10-CM

## 2020-04-15 DIAGNOSIS — Z792 Long term (current) use of antibiotics: Secondary | ICD-10-CM

## 2020-04-15 DIAGNOSIS — B370 Candidal stomatitis: Secondary | ICD-10-CM | POA: Diagnosis present

## 2020-04-15 DIAGNOSIS — J984 Other disorders of lung: Secondary | ICD-10-CM | POA: Diagnosis present

## 2020-04-15 DIAGNOSIS — I442 Atrioventricular block, complete: Secondary | ICD-10-CM | POA: Diagnosis present

## 2020-04-15 DIAGNOSIS — I472 Ventricular tachycardia, unspecified: Secondary | ICD-10-CM

## 2020-04-15 DIAGNOSIS — G931 Anoxic brain damage, not elsewhere classified: Secondary | ICD-10-CM | POA: Diagnosis not present

## 2020-04-15 DIAGNOSIS — Z96652 Presence of left artificial knee joint: Secondary | ICD-10-CM | POA: Diagnosis present

## 2020-04-15 DIAGNOSIS — I898 Other specified noninfective disorders of lymphatic vessels and lymph nodes: Secondary | ICD-10-CM | POA: Diagnosis present

## 2020-04-15 DIAGNOSIS — Z9889 Other specified postprocedural states: Secondary | ICD-10-CM

## 2020-04-15 DIAGNOSIS — I76 Septic arterial embolism: Secondary | ICD-10-CM | POA: Diagnosis present

## 2020-04-15 DIAGNOSIS — G8929 Other chronic pain: Secondary | ICD-10-CM | POA: Diagnosis present

## 2020-04-15 DIAGNOSIS — J45909 Unspecified asthma, uncomplicated: Secondary | ICD-10-CM | POA: Diagnosis present

## 2020-04-15 DIAGNOSIS — I252 Old myocardial infarction: Secondary | ICD-10-CM

## 2020-04-15 DIAGNOSIS — I471 Supraventricular tachycardia: Secondary | ICD-10-CM | POA: Diagnosis present

## 2020-04-15 DIAGNOSIS — M25561 Pain in right knee: Secondary | ICD-10-CM | POA: Diagnosis present

## 2020-04-15 DIAGNOSIS — E09649 Drug or chemical induced diabetes mellitus with hypoglycemia without coma: Secondary | ICD-10-CM | POA: Diagnosis not present

## 2020-04-15 DIAGNOSIS — R079 Chest pain, unspecified: Secondary | ICD-10-CM

## 2020-04-15 DIAGNOSIS — R52 Pain, unspecified: Secondary | ICD-10-CM

## 2020-04-15 DIAGNOSIS — Z79899 Other long term (current) drug therapy: Secondary | ICD-10-CM

## 2020-04-15 DIAGNOSIS — T380X5A Adverse effect of glucocorticoids and synthetic analogues, initial encounter: Secondary | ICD-10-CM | POA: Diagnosis present

## 2020-04-15 DIAGNOSIS — R918 Other nonspecific abnormal finding of lung field: Secondary | ICD-10-CM | POA: Diagnosis present

## 2020-04-15 DIAGNOSIS — Z820 Family history of epilepsy and other diseases of the nervous system: Secondary | ICD-10-CM

## 2020-04-15 DIAGNOSIS — I4901 Ventricular fibrillation: Secondary | ICD-10-CM | POA: Diagnosis not present

## 2020-04-15 DIAGNOSIS — Z882 Allergy status to sulfonamides status: Secondary | ICD-10-CM

## 2020-04-15 DIAGNOSIS — Z8616 Personal history of COVID-19: Secondary | ICD-10-CM

## 2020-04-15 DIAGNOSIS — J9601 Acute respiratory failure with hypoxia: Secondary | ICD-10-CM | POA: Diagnosis present

## 2020-04-15 DIAGNOSIS — Z800 Family history of malignant neoplasm of digestive organs: Secondary | ICD-10-CM

## 2020-04-15 DIAGNOSIS — R519 Headache, unspecified: Secondary | ICD-10-CM | POA: Diagnosis not present

## 2020-04-15 DIAGNOSIS — X58XXXA Exposure to other specified factors, initial encounter: Secondary | ICD-10-CM | POA: Diagnosis present

## 2020-04-15 DIAGNOSIS — M1990 Unspecified osteoarthritis, unspecified site: Secondary | ICD-10-CM | POA: Diagnosis present

## 2020-04-15 DIAGNOSIS — R531 Weakness: Secondary | ICD-10-CM | POA: Diagnosis present

## 2020-04-15 DIAGNOSIS — D84821 Immunodeficiency due to drugs: Secondary | ICD-10-CM | POA: Diagnosis present

## 2020-04-15 DIAGNOSIS — I21A1 Myocardial infarction type 2: Secondary | ICD-10-CM | POA: Diagnosis present

## 2020-04-15 DIAGNOSIS — Z888 Allergy status to other drugs, medicaments and biological substances status: Secondary | ICD-10-CM

## 2020-04-15 DIAGNOSIS — D849 Immunodeficiency, unspecified: Secondary | ICD-10-CM | POA: Diagnosis present

## 2020-04-15 DIAGNOSIS — R0902 Hypoxemia: Secondary | ICD-10-CM

## 2020-04-15 DIAGNOSIS — R778 Other specified abnormalities of plasma proteins: Secondary | ICD-10-CM

## 2020-04-15 DIAGNOSIS — Z885 Allergy status to narcotic agent status: Secondary | ICD-10-CM

## 2020-04-15 DIAGNOSIS — E875 Hyperkalemia: Secondary | ICD-10-CM | POA: Diagnosis not present

## 2020-04-15 DIAGNOSIS — D8685 Sarcoid myocarditis: Secondary | ICD-10-CM | POA: Diagnosis not present

## 2020-04-15 DIAGNOSIS — Z880 Allergy status to penicillin: Secondary | ICD-10-CM

## 2020-04-15 DIAGNOSIS — I959 Hypotension, unspecified: Secondary | ICD-10-CM | POA: Diagnosis not present

## 2020-04-15 DIAGNOSIS — I428 Other cardiomyopathies: Secondary | ICD-10-CM | POA: Diagnosis present

## 2020-04-15 DIAGNOSIS — E785 Hyperlipidemia, unspecified: Secondary | ICD-10-CM | POA: Diagnosis present

## 2020-04-15 DIAGNOSIS — Z66 Do not resuscitate: Secondary | ICD-10-CM | POA: Diagnosis not present

## 2020-04-15 DIAGNOSIS — S5011XA Contusion of right forearm, initial encounter: Secondary | ICD-10-CM | POA: Diagnosis present

## 2020-04-15 DIAGNOSIS — Z95810 Presence of automatic (implantable) cardiac defibrillator: Secondary | ICD-10-CM

## 2020-04-15 DIAGNOSIS — E871 Hypo-osmolality and hyponatremia: Secondary | ICD-10-CM

## 2020-04-15 DIAGNOSIS — R197 Diarrhea, unspecified: Secondary | ICD-10-CM | POA: Diagnosis present

## 2020-04-15 DIAGNOSIS — Z7984 Long term (current) use of oral hypoglycemic drugs: Secondary | ICD-10-CM

## 2020-04-15 DIAGNOSIS — I462 Cardiac arrest due to underlying cardiac condition: Secondary | ICD-10-CM | POA: Diagnosis not present

## 2020-04-15 DIAGNOSIS — Z7901 Long term (current) use of anticoagulants: Secondary | ICD-10-CM

## 2020-04-15 DIAGNOSIS — I5022 Chronic systolic (congestive) heart failure: Secondary | ICD-10-CM | POA: Diagnosis present

## 2020-04-15 DIAGNOSIS — I480 Paroxysmal atrial fibrillation: Secondary | ICD-10-CM | POA: Diagnosis present

## 2020-04-15 DIAGNOSIS — Z515 Encounter for palliative care: Secondary | ICD-10-CM

## 2020-04-15 DIAGNOSIS — R911 Solitary pulmonary nodule: Secondary | ICD-10-CM

## 2020-04-15 DIAGNOSIS — Z7952 Long term (current) use of systemic steroids: Secondary | ICD-10-CM

## 2020-04-15 LAB — CBC
HCT: 39 % (ref 36.0–46.0)
Hemoglobin: 12.8 g/dL (ref 12.0–15.0)
MCH: 33.6 pg (ref 26.0–34.0)
MCHC: 32.8 g/dL (ref 30.0–36.0)
MCV: 102.4 fL — ABNORMAL HIGH (ref 80.0–100.0)
Platelets: 215 10*3/uL (ref 150–400)
RBC: 3.81 MIL/uL — ABNORMAL LOW (ref 3.87–5.11)
RDW: 20.3 % — ABNORMAL HIGH (ref 11.5–15.5)
WBC: 6.5 10*3/uL (ref 4.0–10.5)
nRBC: 9.7 % — ABNORMAL HIGH (ref 0.0–0.2)

## 2020-04-15 LAB — RESP PANEL BY RT-PCR (FLU A&B, COVID) ARPGX2
Influenza A by PCR: NEGATIVE
Influenza B by PCR: NEGATIVE
SARS Coronavirus 2 by RT PCR: NEGATIVE

## 2020-04-15 LAB — BRAIN NATRIURETIC PEPTIDE: B Natriuretic Peptide: 492.8 pg/mL — ABNORMAL HIGH (ref 0.0–100.0)

## 2020-04-15 LAB — BASIC METABOLIC PANEL
Anion gap: 13 (ref 5–15)
BUN: 21 mg/dL — ABNORMAL HIGH (ref 6–20)
CO2: 21 mmol/L — ABNORMAL LOW (ref 22–32)
Calcium: 8.9 mg/dL (ref 8.9–10.3)
Chloride: 95 mmol/L — ABNORMAL LOW (ref 98–111)
Creatinine, Ser: 0.74 mg/dL (ref 0.44–1.00)
GFR, Estimated: >60 mL/min (ref >60)
Glucose, Bld: 212 mg/dL — ABNORMAL HIGH (ref 70–99)
Potassium: 3.8 mmol/L (ref 3.5–5.1)
Sodium: 129 mmol/L — ABNORMAL LOW (ref 135–145)

## 2020-04-15 LAB — MAGNESIUM: Magnesium: 1.5 mg/dL — ABNORMAL LOW (ref 1.7–2.4)

## 2020-04-15 LAB — I-STAT BETA HCG BLOOD, ED (MC, WL, AP ONLY): I-stat hCG, quantitative: <5 m[IU]/mL (ref <5)

## 2020-04-15 LAB — SEDIMENTATION RATE: Sed Rate: 73 mm/hr — ABNORMAL HIGH (ref 0–22)

## 2020-04-15 LAB — TROPONIN I (HIGH SENSITIVITY)
Troponin I (High Sensitivity): 4863 ng/L (ref <18)
Troponin I (High Sensitivity): 4753 ng/L (ref <18)

## 2020-04-15 MED ORDER — CENTRUM SILVER 50+WOMEN PO TABS: 1.0000 | ORAL_TABLET | Freq: Every day | ORAL | Status: DC

## 2020-04-15 MED ORDER — ADULT MULTIVITAMIN W/MINERALS CH
1.0000 | ORAL_TABLET | Freq: Every day | ORAL | Status: DC
  Administered 2020-04-16: 1 via ORAL
  Filled 2020-04-15: qty 1

## 2020-04-15 MED ORDER — SODIUM CHLORIDE 0.9 % IV SOLN: 2.0000 g | Freq: Every day | INTRAVENOUS | Status: DC

## 2020-04-15 MED ORDER — MEXILETINE HCL 150 MG PO CAPS
150.0000 mg | ORAL_CAPSULE | Freq: Two times a day (BID) | ORAL | Status: DC
  Administered 2020-04-15 – 2020-04-16 (×3): 150 mg via ORAL
  Filled 2020-04-15 (×10): qty 1

## 2020-04-15 MED ORDER — VITAMIN D 25 MCG (1000 UNIT) PO TABS
1000.0000 [IU] | ORAL_TABLET | Freq: Every day | ORAL | Status: DC
  Administered 2020-04-16: 1000 [IU] via ORAL
  Filled 2020-04-15: qty 1

## 2020-04-15 MED ORDER — HEPARIN (PORCINE) 25000 UT/250ML-% IV SOLN
900.0000 [IU]/h | INTRAVENOUS | Status: AC
  Administered 2020-04-15: 1000 [IU]/h via INTRAVENOUS
  Administered 2020-04-16: 900 [IU]/h via INTRAVENOUS
  Filled 2020-04-15 (×2): qty 250

## 2020-04-15 MED ORDER — ACETAMINOPHEN 650 MG RE SUPP: 650.0000 mg | Freq: Four times a day (QID) | RECTAL | Status: DC | PRN

## 2020-04-15 MED ORDER — FOLIC ACID 1 MG PO TABS
1.0000 mg | ORAL_TABLET | Freq: Every day | ORAL | Status: DC
  Administered 2020-04-16: 1 mg via ORAL
  Filled 2020-04-15: qty 1

## 2020-04-15 MED ORDER — SODIUM CHLORIDE 0.9 % IV BOLUS: 500.0000 mL | Freq: Once | INTRAVENOUS | Status: DC

## 2020-04-15 MED ORDER — SODIUM CHLORIDE 0.9 % IV BOLUS: 1000.0000 mL | Freq: Once | INTRAVENOUS | Status: DC

## 2020-04-15 MED ORDER — SODIUM CHLORIDE 0.9 % IV SOLN
2.0000 g | Freq: Three times a day (TID) | INTRAVENOUS | Status: DC
  Administered 2020-04-16: 2 g via INTRAVENOUS
  Filled 2020-04-15: qty 2

## 2020-04-15 MED ORDER — OXYCODONE HCL 5 MG PO TABS
5.0000 mg | ORAL_TABLET | ORAL | Status: DC | PRN
  Administered 2020-04-16 (×2): 5 mg via ORAL
  Filled 2020-04-15 (×2): qty 1

## 2020-04-15 MED ORDER — VANCOMYCIN HCL 1250 MG/250ML IV SOLN
1250.0000 mg | Freq: Once | INTRAVENOUS | Status: AC
  Administered 2020-04-15: 1250 mg via INTRAVENOUS
  Filled 2020-04-15: qty 250

## 2020-04-15 MED ORDER — ACETAMINOPHEN 325 MG PO TABS: 650.0000 mg | ORAL_TABLET | Freq: Four times a day (QID) | ORAL | Status: DC | PRN

## 2020-04-15 MED ORDER — PREDNISONE 20 MG PO TABS
60.0000 mg | ORAL_TABLET | Freq: Every day | ORAL | Status: DC
  Administered 2020-04-16: 60 mg via ORAL
  Filled 2020-04-15: qty 3

## 2020-04-15 MED ORDER — MAGNESIUM SULFATE 4 GM/100ML IV SOLN
4.0000 g | Freq: Once | INTRAVENOUS | Status: AC
  Administered 2020-04-15: 4 g via INTRAVENOUS
  Filled 2020-04-15: qty 100

## 2020-04-15 MED ORDER — METHOTREXATE 2.5 MG PO TABS: 6.2500 mg | ORAL_TABLET | ORAL | Status: DC

## 2020-04-15 MED ORDER — FENTANYL CITRATE (PF) 100 MCG/2ML IJ SOLN
50.0000 ug | Freq: Once | INTRAMUSCULAR | Status: AC
  Administered 2020-04-15: 50 ug via INTRAVENOUS
  Filled 2020-04-15: qty 2

## 2020-04-15 MED ORDER — ALBUTEROL SULFATE HFA 108 (90 BASE) MCG/ACT IN AERS
1.0000 | INHALATION_SPRAY | Freq: Four times a day (QID) | RESPIRATORY_TRACT | Status: DC | PRN
  Filled 2020-04-15: qty 6.7

## 2020-04-15 MED ORDER — PRAVASTATIN SODIUM 10 MG PO TABS
10.0000 mg | ORAL_TABLET | Freq: Every day | ORAL | Status: DC
  Administered 2020-04-16 – 2020-04-17 (×3): 10 mg via ORAL
  Filled 2020-04-15 (×3): qty 1

## 2020-04-15 MED ORDER — SODIUM CHLORIDE 0.9 % IV SOLN
2.0000 g | Freq: Once | INTRAVENOUS | Status: AC
  Administered 2020-04-15: 2 g via INTRAVENOUS
  Filled 2020-04-15: qty 2

## 2020-04-15 MED ORDER — MIDODRINE HCL 5 MG PO TABS
5.0000 mg | ORAL_TABLET | Freq: Three times a day (TID) | ORAL | Status: DC
  Administered 2020-04-15 – 2020-04-17 (×6): 5 mg via ORAL
  Filled 2020-04-15 (×6): qty 1

## 2020-04-15 MED ORDER — PRAVASTATIN SODIUM 10 MG PO TABS: 10.0000 mg | ORAL_TABLET | Freq: Every day | ORAL | Status: DC

## 2020-04-15 MED ORDER — BIOTIN 10 MG PO CAPS: 10.0000 mg | ORAL_CAPSULE | Freq: Every day | ORAL | Status: DC

## 2020-04-15 MED ORDER — AMIODARONE HCL 200 MG PO TABS
200.0000 mg | ORAL_TABLET | Freq: Two times a day (BID) | ORAL | Status: DC
  Administered 2020-04-15: 200 mg via ORAL
  Filled 2020-04-15: qty 1

## 2020-04-15 MED ORDER — VANCOMYCIN HCL 1250 MG/250ML IV SOLN: 1250.0000 mg | INTRAVENOUS | Status: DC

## 2020-04-15 MED ORDER — VANCOMYCIN HCL IN DEXTROSE 1-5 GM/200ML-% IV SOLN: 1000.0000 mg | Freq: Once | INTRAVENOUS | Status: DC

## 2020-04-15 MED ORDER — SULFAMETHOXAZOLE-TRIMETHOPRIM 800-160 MG PO TABS
1.0000 | ORAL_TABLET | ORAL | Status: DC
  Administered 2020-04-17: 1 via ORAL
  Filled 2020-04-15 (×2): qty 1

## 2020-04-15 MED ORDER — POLYETHYLENE GLYCOL 3350 17 G PO PACK
17.0000 g | PACK | Freq: Every day | ORAL | Status: DC
  Administered 2020-04-16: 17 g via ORAL
  Filled 2020-04-15: qty 1

## 2020-04-15 MED ORDER — IOHEXOL 350 MG/ML SOLN
75.0000 mL | Freq: Once | INTRAVENOUS | Status: AC | PRN
  Administered 2020-04-15: 75 mL via INTRAVENOUS

## 2020-04-15 MED ORDER — PANTOPRAZOLE SODIUM 40 MG PO TBEC
80.0000 mg | DELAYED_RELEASE_TABLET | Freq: Every day | ORAL | Status: DC
  Administered 2020-04-16: 80 mg via ORAL
  Filled 2020-04-15: qty 2

## 2020-04-15 MED ORDER — VANCOMYCIN HCL 750 MG/150ML IV SOLN
750.0000 mg | Freq: Two times a day (BID) | INTRAVENOUS | Status: DC
  Filled 2020-04-15: qty 150

## 2020-04-15 MED ORDER — GABAPENTIN 600 MG PO TABS
600.0000 mg | ORAL_TABLET | Freq: Two times a day (BID) | ORAL | Status: DC
  Administered 2020-04-16 (×3): 600 mg via ORAL
  Filled 2020-04-15 (×4): qty 1

## 2020-04-15 MED ORDER — VENLAFAXINE HCL ER 150 MG PO CP24
150.0000 mg | ORAL_CAPSULE | Freq: Every day | ORAL | Status: DC
  Administered 2020-04-16 – 2020-04-17 (×2): 150 mg via ORAL
  Filled 2020-04-15 (×2): qty 1

## 2020-04-15 NOTE — ED Notes (Signed)
Admitting providers at bedside

## 2020-04-15 NOTE — ED Notes (Signed)
IV team at bedside 

## 2020-04-15 NOTE — ED Notes (Signed)
Notified Dykstra MD about troponin of 4863.

## 2020-04-15 NOTE — ED Triage Notes (Signed)
Pt arrives via [facility] from home c/o shortness of breath for 1 week, today seems worse. Sob is worse with exertion.  Arrives at 99% on 4L [place] she does not wear o2 at home.

## 2020-04-15 NOTE — Progress Notes (Signed)
FPTS received page for admission.  Sign out from Dr. Sherry Ruffing: Patient is a 54 year old with a history of HFrEF (Last EF 27%), Cardiac Sarcoid, Hx VT, who presents with 1 week of Chest pain and SOB.  In ED, found to by tachycardic and tachyphenic.  CTA ordered and showed no PE, but did show findings suggestive of septic emboli.  Troponin elevated to 4863, down-trended slightly to 4753.  COVID and flu negative.  BNP also down from previous, now 492, previously was 1298.  Cardiology requested ID consult for possible cavitary lesions in lungs worrisome for fungal infection.  ID stated that Vanc and CTX would be sufficient and can re-consult if needed during admission.  Per ED provider report, patient's Bps have now been stable despite hypotension on arrival.  Blood cultures have been ordered and ED to add abx per ID recommendations.  Patient seen at bedside.  Monitor showing intermittent nonsustained VT, about 3 beats.  Patient states that overall she is feeling okay.  Able to sit up and have conversation.  BP in 70s in the room.   Briefly discussed patient with Dr. Andria Frames, who recommended antibiotics as quickly as possible after culture (ordered by ED), carefully assessing volume status, and stress dosing steroids.  Night team will formally admit patient.  [place] Constable, D.O.  PGY-3 Family Medicine  04/28/2020 7:45 PM

## 2020-04-15 NOTE — H&P (Addendum)
[facility] Admission History and Physical Service Pager: [phone]  Patient name: Lisa Poole Medical record number: [id number] Date of birth: 1965-08-21 Age: 54 y.o. Gender: female  Primary Care Provider: Milford Cage, PA Consultants: Cardiology, ID, pharmacy  Code Status:  Full code Preferred Emergency Contact : Shawntrice Salle, spouse,  [phone]  Chief Complaint: Shortness of Breath  Assessment and Plan: Lisa Poole is a 54 y.o. female who presented with chest pain and dyspnea x 1 week and was admitted for further evaluation and management of multiple pulmonary nodules concerning for septic emboli. Past medical history is s/f multiple cardiac pathologies: suspected cardiac sarcoidosis, HFrEF (27% EF), ICD, ventriclar tacchyarrhytmia, as well as diabetes, asthma, HTN.   Dyspnea Patient presenting with worsening dyspnea x 1-1.5 weeks that is associated with pain across her chest and back when taking deep breaths or coughing.  Patient denies any fevers, chills, productive cough.  She denies any recent travel.  In the ED, her vital signs are generally stable with exception of low blood pressure 73/57.  She was given half liter bolus and started on midodrine by pharmacy with improvement.  Her blood pressures and maps have been stable.  Initial work-up with no leukocytosis, BNP 492.  Chest x-ray with patchy airspace and interstitial opacities.  CTA was performed to rule out pulmonary embolism and demonstrated multiple new bilateral pulmonary nodules since September.  Given no other signs of bacterial or viral pneumonia, fluid overload, negative COVID, this is the most likely cause of patient's new onset dyspnea.  This patient's case was discussed with infectious disease, who thought that it was much less likely that this is an infectious process.  They recommended reconsultation if needed.  Differential for pulmonary nodules include:  Fungal/opportunistic  infection: highest on differential given immunocomprimised state. This includes pneumocystis, cryptococcus, CMV, herpes zoster, nocardia Mycobacterium, candida. Strongest suspicion for Candida pneumonia. Patient reports persistent thrush since starting on high dose prednisone. CT findings consistent: multiple nodules, well defined, associated with patchy airspace consolidations and positive halo sign.  Other oppornustic infections that typically present with bilateral multi-nodular pattern include aspergillosis, Mycobacterium tb, CMV. Virology performed at [facility] prior to starting infliximab. Positive Ab to CMV, EBV, HSV I and VZV w/ negative Ag. 11/5 quantiferon gold negative. Patient currently takes PJP ppx three times weekly.   See HPI for other pertinent negatives in patient's history.   Septic emboli: Patient was started on heparin drip, empiric vancomycin and cefepime.  Blood cultures were also obtained.  Patient denies any history of IV drug use.  She did have a recent cardiac MRI on 03/15/2020 that did not detect any valvular vegetations concerning for infective endocarditis. Other less likely causes considered: MTX induced lung injury: Patient has been on methotrexate since August 2021 for cardiac sarcoidosis.  Spoke with radiologist who reports that it may be possible the lesion in the left lung apex may be consistent with an organizing pneumonia which can be seen with methotrexate pneumonitis, however, an infectious cause would be much more likely in this patient given immunocompromise state. Pulmonary sarcoidosis: Unlikely given high-res CT in September showed no evidence of sarcoid. Amiodarone lung toxicity: Typically slowly progressive and not consistent with pulmonary pattern. Radiology does not recommend further imaging with HRCT at this time.  . Admit to FMTS, attending Dr. Andria Frames. Level of care: progressive  . Continuous cardiac monitoring  . Vitals per unit routine, O2 >92%, Activity    . Reconsult ID in the AM  .  Follow up blood cultures . Continue empiric antibiotics  . AM CBC w/ differential (neutropenia or eosinophilia?) . Consult pulmonology in AM for bronchoscopy . Given stable respiratory status at this time, will hold on empiric antifungal therapy until further discussion with pulm/ID  . Continue Heparin gtt (holding eliquis)  Pleuritic Chest Pain  Band like chest pain with inspiration. Troponins elevated >4,000 x 2 and EKG without evidence of STEMI. CTA with multiple inumerble pulmonary nodules, new since last CT. Differential includes endocarditis, myo/peri-carditis which particular concern for systemic/invasive infections listed above, sarcoidosis.   MSK Pain control with oxycodone 48m   Incentive spirometry q2H . TTE with likely TEE  . Continue empiric antibiotics  . Repeat troponins tomorrow   Petechial lesions  Scattered 64mm well circumscribed erythematous lesions (petechaie?) across lower abdomen, bilateral breasts and thighs. Patietn reports that these lesions started recently. Her platelets are normal. Though extremely rare, does increase concern for systemic candidiasis. Patient denies pruritis, purulence, warmth.   Continue to monitor   Can consider biopsy?  HFrEF  Heart failure following. Home meds include: spironolactone 12.5 mg, lasix 20 mg EOD, Toprol XL 12.5 mg, farxiga.  . Holding HF meds with hypotension  . Started on midodrine 5 mg TID   Cardiac Sarcoidosis  Suspected diagnosis given PET and CMRI findings. [place] admission from 11/3-11/12 for further work up.  Rheumatology consulted during hospital admission.  Patient was continued on prednisone with Bactrim PGP prophylaxis.  Methotrexate was titrated up to 25 mg q Wednesday.  She is also on folic acid and vitamin D supplements.  She started infliximab infusion during admission with plans to follow-up with rheumatology for further infusions.  . Continue home prednisone 60 mg daily,  methotrexate 25 mg q Wednesday  . Continue folic acid and vitamin D  . Protonix for GI protection   Ventricular Tachycardia s/p MDT CRT-D 02/2019 Device interrogated in ED. Per cards note, 7,000 episodes of NSVT on 05/05/2020 Home meds: amiodorone, mexilitine BID. Recently admitted for this on 11/1 and transferred to [facility]. Concern for VT 2/2 sarcoid inflmation.  . K>4.0 & Mg >2  . Continue home medications . HF team following, appreciate recommendations   Paroxysmal a fib Home meds: apixiban . Heparin gtt   Oral thrush  Physical exam consistent with oral thrush. Patient reports mild sore throat, but no odynophagia or pain with swallowing; less concern for Candida esophagitis. Has been treated with multiple courses of 3 day fluconzaole.  Was also on a 14-day course after discharge from [place] (11/1 through 11/15).  She reports some mild improvement with short 3-day courses, but has not had complete resolution.  Patient reports that she has struggled with thrush since starting high-dose prednisone in August 2021.  Marland Kitchen No indication for further susceptible testing at this time  . Holding on antifungal therapy as above. If no concern for candidemia, should restart oral fluconazole or other PO antifungal  Type 2 DM High dose chronic prednisone  A1C 9.5% 03/2020. Current home medications include farxiga, metformin, glipizide.  . Holding home medications  . sSSI TID AC + QHS   Arthritis  Patient reports using hydrocodone acetaminophen 10-325mg  at home for arthritis.  On PDMP review, she receives #120 monthly..  She does report taking this due to recent chest pain.  Patient reports that she is tried not to take these due to recent hypotension at home.  acetominophen PRN for mild pain   Oxycodone 5 mg q4 hours PRN moderate to severe pain  PT/OT eval and treat   Generalized Weakness  Per patient, she reports diffuse generalized weakness and difficulty sitting up from recliner.  She reports  decreased endurance and typically can only walk about 20 feet at a time.  Differential includes deconditioning from due to hospitalizations and cardiac pathologies.  She has 5 out of 5 strength on exam today in upper and lower extremities.  Low concern for neuromuscular disorder.  PT OT eval and treat  FEN/GI:  . Fluids: use with caution  . K > 4, Mg >2 . Regular diet  . PTX  80 mg  VTE prophylaxis: Heparin gtt  Disposition:  Status is: Observation  The patient remains OBS appropriate and will d/c before 2 midnights.  Dispo: The patient is from: Home              Anticipated d/c is to: Home              Anticipated d/c date is: 2 days              Patient currently is not medically stable to d/c.   ============================================================================= HPI Lisa Poole is a 54 y.o. female with past medical history significant for VT s/p ICD, NICM, HFrEF, concern for cardiac sarcoidosis, paroxysmal A. fib, chronic prednisone use, T2DM, who presents with a week and a half of worsening dyspnea and worsening chest pain.  Patient reports that the chest pain is located across her chest and across her back.  She reports worsening pain with inhalation and cough.  She denies any recent fevers, chills, sick contacts.  No recent upper respiratory infections, productive cough.   No recent travel. Previously healthcare provider, previous TB tests negative. Unsure if she had TB testing prior to starting immunosuppression. She knows she has not had a skin test. She has thrush, "can't seem to get rid of it." Coming and going without fully clearing up for last 4-5 months since starting prednisone. Takes fluconazole 150 mg once daily x3 days for burst. Now on third refill of 3-day bursts. Never fully clears from 3 day prednisone burst. Not trying mouthwash, hasn't been sure what to try. No worsening of thrush recently. Mostly on tongue, not really lips or cheek. Has pets at home,  dogs. [place]. Hasn't worked since May. 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 No history of smoking. Denies drug and alcohol use. Fam history negative - no immunocopromise, no heart issues.   Has rx for hydrocodone four times daily. Has been trying to avoid taking due to soft blood pressure overnight. Only takes it if she has to. Started it after shoulder and knee surgeries. Has chronic pain in back and knees.   Constipation and diarrhea "back and forth the last couple weeks, not sure if medicine". Pain feels like banding, a "band that goes all the way around". Weakness, can't raise from chair. Once standing, can go easily. Endurance reduced, can only go 25 feet. Reduced endurance and body weakness in last 3 weeks.  Review Of Systems: Review of Systems  Constitutional: Negative for chills, diaphoresis and fever.  HENT: Positive for sore throat. Negative for sinus pressure and sinus pain.   Eyes: Positive for photophobia. Negative for visual disturbance.  Respiratory: Negative for cough (Can't cough bc it hurts).   Cardiovascular: Positive for chest pain.  Gastrointestinal: Positive for constipation and diarrhea. Negative for abdominal pain, nausea and vomiting.  Musculoskeletal: Positive for back pain.  Neurological: Positive for  weakness (Can't raise from chair, muscle weakness) and headaches (Since 7am, bilat sides, photophobia).   Patient Active Problem List   Diagnosis Date Noted  . Septic embolism ([place]) 04/21/2020  . Ventricular tachyarrhythmia ([place]) 03/11/2020  . Cardiogenic shock ([place]) 03/11/2020  . Cardiac sarcoidosis 02/13/2020  . Acute kidney injury (nontraumatic) ([place])   . Defibrillator discharge   . Acute left flank pain 01/11/2020  . Thrush 01/11/2020  . ICD (implantable cardioverter-defibrillator) in place 01/09/2020  . Ventricular tachycardia ([place]) 12/19/2019  . Wound infection after surgery 12/19/2019  . Chronic systolic heart failure  ([place])   . Shortness of breath   . Acute decompensated heart failure ([place]) 11/26/2019  . Uterovaginal prolapse, incomplete 02/15/2019  . Complete heart block ([place]) 02/11/2019  . Cystocele without uterine prolapse 12/12/2018  . Elevated troponin level   . Chest discomfort   . NSTEMI (non-ST elevated myocardial infarction) ([place]) 09/27/2018  . Primary osteoarthritis of left knee 12/10/2017  . S/P knee replacement 12/10/2017  . Pain in left knee 10/20/2017  . Tear of right rotator cuff 05/20/2017  . Acromioclavicular arthrosis 08/01/2013  . Aortic insufficiency   . Dry eye syndrome 11/25/2007  . Asthma 09/13/2006  . Diabetes mellitus ([place]) 09/13/2006  . HTN (hypertension) 09/13/2006   Past Medical History: Past Medical History:  Diagnosis Date  . AICD (automatic cardioverter/defibrillator) present   . Aortic insufficiency   . Asthma 09/13/2006  . Complete heart block ([place]) 02/11/2019  . Cystocele without uterine prolapse 12/12/2018  . Diabetes mellitus without complication ([place])   . Elevated troponin level   . Hypercholesteremia   . Hypertension   . Presence of permanent cardiac pacemaker   . Primary osteoarthritis of left knee 12/10/2017    Past Surgical History: Past Surgical History:  Procedure Laterality Date  . APPENDECTOMY    . BIOPSY  02/08/2020   Procedure: BIOPSY;  Surgeon: Juanito Doom, MD;  Location: [facility] ENDOSCOPY;  Service: Cardiopulmonary;;  . BIV ICD INSERTION CRT-D N/A 12/07/2019   Procedure: BIV ICD INSERTION CRT-D;  Surgeon: Evans Lance, MD;  Location: [place] CV LAB;  Service: Cardiovascular;  Laterality: N/A;  . BRONCHIAL WASHINGS  02/08/2020   Procedure: BRONCHIAL WASHINGS;  Surgeon: Juanito Doom, MD;  Location: [facility] ENDOSCOPY;  Service: Cardiopulmonary;;  . IABP INSERTION N/A 12/01/2019   Procedure: IABP Insertion;  Surgeon: Larey Dresser, MD;  Location: [place] CV LAB;  Service: Cardiovascular;  Laterality: N/A;  . INSERT / REPLACE /  REMOVE PACEMAKER    . LEFT HEART CATH AND CORONARY ANGIOGRAPHY N/A 09/28/2018   Procedure: LEFT HEART CATH AND CORONARY ANGIOGRAPHY;  Surgeon: Troy Sine, MD;  Location: [place] CV LAB;  Service: Cardiovascular;  Laterality: N/A;  . PACEMAKER IMPLANT N/A 02/13/2019   Procedure: PACEMAKER IMPLANT;  Surgeon: Evans Lance, MD;  Location: [place] CV LAB;  Service: Cardiovascular;  Laterality: N/A;  . PACEMAKER IMPLANT    . RIGHT HEART CATH N/A 02/09/2020   Procedure: RIGHT HEART CATH;  Surgeon: Larey Dresser, MD;  Location: [place] CV LAB;  Service: Cardiovascular;  Laterality: N/A;  . RIGHT/LEFT HEART CATH AND CORONARY ANGIOGRAPHY N/A 12/01/2019   Procedure: RIGHT/LEFT HEART CATH AND CORONARY ANGIOGRAPHY;  Surgeon: Larey Dresser, MD;  Location: [place] CV LAB;  Service: Cardiovascular;  Laterality: N/A;  . ROTATOR CUFF REPAIR Bilateral 2017  . SINUS EXPLORATION    . TOTAL KNEE ARTHROPLASTY Left 12/10/2017   Procedure: LEFT TOTAL KNEE ARTHROPLASTY;  Surgeon: Sydnee Cabal, MD;  Location: [facility] ORS;  Service: Orthopedics;  Laterality: Left;  Adductor Block  . TUBAL LIGATION    . VIDEO BRONCHOSCOPY N/A 02/08/2020   Procedure: VIDEO BRONCHOSCOPY WITH FLUORO;  Surgeon: Juanito Doom, MD;  Location: [place];  Service: Cardiopulmonary;  Laterality: N/A;    Family History: family history includes Alzheimer's disease in her mother; Colon cancer in her maternal grandmother.   Social History: Social History   Social History Narrative  . Not on file    Myleah reports that she has never smoked. She has never used smokeless tobacco. She reports that she does not drink alcohol and does not use drugs.  Allergies and Medications: Allergies  Allergen Reactions  . Lisinopril Other (See Comments) and Cough    Flu like symptoms, also   . Ace Inhibitors Other (See Comments)    Flu-like symptoms  . Augmentin [Amoxicillin-Pot Clavulanate] Nausea And Vomiting  . Benazepril  Other (See Comments)    Flu like symptoms  . Oxycodone-Acetaminophen Nausea And Vomiting  . Sulfa Antibiotics Nausea And Vomiting   No outpatient medications have been marked as taking for the 05/05/2020 encounter [facility] Encounter).    Objective: BP 103/77   Pulse 96   Temp 99.7 F (37.6 C) (Oral)   Resp (!) 21   Ht 5' (1.524 m)   Wt 64.9 kg   LMP 07/25/2015 (Exact Date)   SpO2 93%   BMI 27.93 kg/m  Filed Weights   04/11/2020 1442  Weight: 64.9 kg   Exam: Physical Exam Constitutional:      General: She is not in acute distress.    Appearance: She is well-developed. She is ill-appearing. She is not toxic-appearing or diaphoretic.     Interventions: She is not intubated. HENT:     Head: Normocephalic and atraumatic.     Mouth/Throat:     Comments: White plaque across tongue. No white plaques appreciated on cheeks, lips  Eyes:     Extraocular Movements: Extraocular movements intact.     Pupils: Pupils are equal, round, and reactive to light.  Cardiovascular:     Rate and Rhythm: Normal rate and regular rhythm.     Heart sounds: Normal heart sounds.  Pulmonary:     Effort: Pulmonary effort is normal. No tachypnea. She is not intubated.     Breath sounds: Examination of the right-middle field reveals rales. Examination of the left-middle field reveals rales. Examination of the right-lower field reveals rales. Examination of the left-lower field reveals rales. Rales present. No decreased breath sounds.  Chest:     Chest wall: Tenderness present. No mass, crepitus or edema.  Abdominal:     General: Bowel sounds are normal.     Palpations: Abdomen is soft.     Tenderness: There is no abdominal tenderness. There is no rebound.  Musculoskeletal:        General: Normal range of motion.     Cervical back: Normal range of motion and neck supple.     Comments: 5 out of 5 strength in major muscle groups of upper and lower extremities.  Lymphadenopathy:     Cervical: No cervical  adenopathy.  Skin:    General: Skin is warm.     Comments: Multiple scattered petechiae (1 mm lesions) across lower abdomen.  Also present on bilateral breasts.  See picture.  Neurological:     General: No focal deficit present.     Mental Status: She is alert and oriented to person,  place, and time.  Psychiatric:        Mood and Affect: Mood normal.        Behavior: Behavior normal.        Labs and Imaging: I have personally reviewed following labs and imaging studies CBC: Recent Labs  Lab 05/04/2020 1337  WBC 6.5  HGB 12.8  HCT 39.0  MCV 102.4*  PLT 215   CMP: Recent Labs  Lab 04/16/2020 1337 04/14/2020 1412  NA 129*  --   K 3.8  --   CL 95*  --   CO2 21*  --   GLUCOSE 212*  --   BUN 21*  --   CREATININE 0.74  --   CALCIUM 8.9  --   MG  --  1.5*   GFR: Estimated Creatinine Clearance: 66.9 mL/min (by C-G formula based on SCr of 0.74 mg/dL). Troponin 6659>9357 BNP 492.8   Imaging/Diagnostic Tests: DG Chest 2 View  Result Date: 04/22/2020 CLINICAL DATA:  Shortness of breath. EXAM: CHEST - 2 VIEW COMPARISON:  Chest x-ray dated March 11, 2020. FINDINGS: Unchanged left chest wall pacemaker. The heart size and mediastinal contours are within normal limits. Mild peripheral patchy airspace and interstitial opacities in the right mid and left lower lungs. No pleural effusion or pneumothorax. No acute osseous abnormality. IMPRESSION: 1. Mild peripheral patchy airspace and interstitial opacities in both lungs could reflect pulmonary edema or atypical infection, including viral pneumonia. Electronically Signed   By: Titus Dubin M.D.   On: 04/14/2020 14:12   CT Angio Chest PE W and/or Wo Contrast  Result Date: 05/02/2020 CLINICAL DATA:  54 year old female with shortness of breath, chest pain. History of sarcoidosis, although no significant lung involvement on prior CT. EXAM: CT ANGIOGRAPHY CHEST WITH CONTRAST TECHNIQUE: Multidetector CT imaging of the chest was performed  using the standard protocol during bolus administration of intravenous contrast. Multiplanar CT image reconstructions and MIPs were obtained to evaluate the vascular anatomy. CONTRAST:  1mL OMNIPAQUE IOHEXOL 350 MG/ML SOLN COMPARISON:  Chest radiographs earlier today. Chest CT without contrast 02/07/2020. FINDINGS: Cardiovascular: Excellent contrast bolus timing in the pulmonary arterial tree. No focal filling defect identified in the pulmonary arteries to suggest acute pulmonary embolism. Calcified coronary artery atherosclerosis identified on series 6, image 125. Chronic cardiac pacemaker type device from left subclavian approach again noted. Stable cardiac size since September. No pericardial effusion. Little contrast in the aorta today. Mediastinum/Nodes: Chronic postinflammatory calcified left hilar and AP window lymph nodes are stable. No mediastinal or hilar lymphadenopathy. Lungs/Pleura: New since September widespread abnormal pulmonary opacity scattered in both lungs. Nonenhancing roughly 3 cm area of consolidation suspected in the left lung apex. Innumerable new pulmonary nodules. Diffuse indistinct pulmonary nodules ranging from 2-3 mm to 2.5 cm diameter, and some of the largest nodules are cavitary (left lower lobe series 7, image 51 and anterior right upper lobe image 32. Major airways remain patent. No associated pleural effusion. Upper Abdomen: Scattered calcified granulomas in the visible liver and spleen are stable. Small postinflammatory calcified lymph nodes also redemonstrated at the porta hepatis. Otherwise negative visible largely noncontrast upper abdomen including partially visible pancreas, adrenal glands, renal upper poles and bowel. Musculoskeletal: No acute osseous abnormality identified. Review of the MIP images confirms the above findings. IMPRESSION: 1. Negative for acute pulmonary embolus. 2. However, there are innumerable new bilateral pulmonary nodule since September, the largest  of which are cavitary. Some more generalized pulmonary consolidation (left lung apex). No mediastinal lymphadenopathy or pleural effusion.  Top differential considerations include septic emboli, and endemic or invasive fungal disease (Aspergillus, Candida, mucormycosis, etc). Recommend blood cultures. 3. Superimposed chronic sequelae of granulomatous disease in the chest and upper abdomen. 4. Calcified coronary artery atherosclerosis. Electronically Signed   By: Genevie Ann M.D.   On: 04/26/2020 17:46    EKG Interpretation  Date/Time:  Monday April 15 2020 14:18:32 EST Ventricular Rate:  110 PR Interval:    QRS Duration: 150 QT Interval:  434 QTC Calculation: 598 R Axis:   -54 Text Interpretation: VENTRICULAR PACED RHYTHM no acute STEMI Confirmed by Madalyn Rob [phone]) on 04/21/2020 2:54:42 PM         Wilber Oliphant, MD 04/20/2020, 7:59 PM PGY-3, [place] [facility] pager: [phone], text pages welcome

## 2020-04-15 NOTE — ED Notes (Signed)
Notified attending provider that pt still needs pain meds for her pain

## 2020-04-15 NOTE — ED Provider Notes (Signed)
3:13 PM Care assumed from Dr. Roslynn Amble.  At time of transfer care, patient is awaiting for diagnostic work-up results including a CT PE study to rule out pulmonary embolism given the patient's tachycardia, tachypnea, chest tightness with a bandlike pleuritic discomfort around her chest and shortness of breath.  Patient also has history of heart failure including cardiac sarcoid.  Patient has a intermittently paced rhythm and has had waxing waning blood pressure values here.  Due to her blood pressure going back into the 90s and low 100s without fluid intervention, and the concern for fluid overload with crackles reported on exam and x-ray showing some edema, previous team was holding on fluids unless needed for persistent hypotension.  Troponin has returned elevated.  Will call cardiology and have them be involved with the patient's care as well.  4:19 PM Cardiology will come see patient.  They requested medicine admission after PE study is completed.  They will come see the patient.  6:10 PM Cardiologist saw the patient and is concerned about septic emboli for the patient.  They request admission to medicine service and they will follow.  They recommended getting infectious disease involved as well for antibiotic recommendations in regards to if we need fungal coverage as this patient appears to be on steroids with now cavitary lung lesions.  We will place a call to ID and hospitalist for admission.  6:25 PM Infectious disease just called and say that it would be unlikely for antifungal coverage needed.  They recommended Vanco and cefepime which will be ordered. Patient will be admitted to family medicine teaching service for further management   Jahmez Bily, Gwenyth Allegra, MD 05/06/2020 2339

## 2020-04-15 NOTE — Progress Notes (Addendum)
ANTICOAGULATION & ANTIBIOTIC CONSULT NOTE - Initial Consult  Pharmacy Consult for heparin; Cefepime & Vancomycin Indication: DVT; Infection of unknown source   Allergies  Allergen Reactions  . Lisinopril Other (See Comments) and Cough    Flu like symptoms, also   . Ace Inhibitors Other (See Comments)    Flu-like symptoms  . Augmentin [Amoxicillin-Pot Clavulanate] Nausea And Vomiting  . Benazepril Other (See Comments)    Flu like symptoms  . Oxycodone-Acetaminophen Nausea And Vomiting  . Sulfa Antibiotics Nausea And Vomiting    Patient Measurements: Height: 5' (152.4 cm) Weight: 64.9 kg (143 lb) IBW/kg (Calculated) : 45.5 Heparin Dosing Weight: 65 kg   Vital Signs: Temp: 99.7 F (37.6 C) (12/06 1329) Temp Source: Oral (12/06 1329) BP: 99/71 (12/06 1800) Pulse Rate: 85 (12/06 1800)  Labs: Recent Labs    04/19/2020 1337 05/01/2020 1505  HGB 12.8  --   HCT 39.0  --   PLT 215  --   CREATININE 0.74  --   TROPONINIHS 4,863* 4,753*    Estimated Creatinine Clearance: 66.9 mL/min (by C-G formula based on SCr of 0.74 mg/dL).   Medical History: Past Medical History:  Diagnosis Date  . AICD (automatic cardioverter/defibrillator) present   . Aortic insufficiency   . Asthma 09/13/2006  . Complete heart block ([place]) 02/11/2019  . Cystocele without uterine prolapse 12/12/2018  . Diabetes mellitus without complication ([place])   . Elevated troponin level   . Hypercholesteremia   . Hypertension   . Presence of permanent cardiac pacemaker   . Primary osteoarthritis of left knee 12/10/2017    Medications:  (Not in a hospital admission)   Assessment: 46 YOF with significant cardiac history on Eliquis at home for h/o DVT. Pharmacy consulted to transition patient to IV heparin while admitted. Patient confirms that her last dose of Eliquis was around 8 AM this morning.   H/H and Plt wnl. SCr wnl   ID: Pharmacy consulted to start Cefepime and vancomycin to empirically cover cavitary  lung lesions. WBC wnl  Goal of Therapy:  Heparin level 0.3-0.7 units/ml aPTT 66-102 seconds Monitor platelets by anticoagulation protocol: Yes   Plan:  -Start IV heparin at 1000 units/hr at 8 PM today. No bolus -F/u 6 hr aPTT -Monitor daily HL, aPTT, CBC and s/s of bleeding  -Start Cefepime 2 gm IV Q 8 hours -Vancomycin 1250 mg IV load followed by vancomycin 750 mg IV Q 12 hours -Monitor CBC, renal fx, cultures and clinical progress -VT at [place], PharmD., BCPS, BCCCP Clinical Pharmacist Please refer to [facility] for unit-specific pharmacist

## 2020-04-15 NOTE — ED Notes (Signed)
Notified admitting provider that pt is requesting pain meds for her pain

## 2020-04-15 NOTE — ED Provider Notes (Signed)
[place] EMERGENCY DEPARTMENT Provider Note   CSN: [id number] Arrival date & time: 04/11/2020  1312     History Chief Complaint  Patient presents with  . Shortness of Breath    Lisa Poole is a 54 y.o. female.  Complex medical history, recent admission for cardiogenic shock, ventricular arrhythmia, cardiac sarcoidosis.  Patient has pacemaker defibrillator.  Presents to ER with concern for chest pain.  Pain wraps around left side of chest from front to back.  Currently moderate.  Feels like a straining sensation.  Also feels mildly short of breath, no cough, no fever.  Breathing worse with exertion.  Pain worse with breathing.  HPI     Past Medical History:  Diagnosis Date  . AICD (automatic cardioverter/defibrillator) present   . Aortic insufficiency   . Asthma 09/13/2006  . Complete heart block ([place]) 02/11/2019  . Cystocele without uterine prolapse 12/12/2018  . Diabetes mellitus without complication ([place])   . Elevated troponin level   . Hypercholesteremia   . Hypertension   . Presence of permanent cardiac pacemaker   . Primary osteoarthritis of left knee 12/10/2017    Patient Active Problem List   Diagnosis Date Noted  . Ventricular tachyarrhythmia ([place]) 03/11/2020  . Cardiogenic shock ([place]) 03/11/2020  . Cardiac sarcoidosis 02/13/2020  . Acute kidney injury (nontraumatic) ([place])   . Defibrillator discharge   . Acute left flank pain 01/11/2020  . Thrush 01/11/2020  . ICD (implantable cardioverter-defibrillator) in place 01/09/2020  . Ventricular tachycardia ([place]) 12/19/2019  . Wound infection after surgery 12/19/2019  . Chronic systolic heart failure ([place])   . Shortness of breath   . Acute decompensated heart failure ([place]) 11/26/2019  . Uterovaginal prolapse, incomplete 02/15/2019  . Complete heart block ([place]) 02/11/2019  . Cystocele without uterine prolapse 12/12/2018  . Elevated troponin level   . Chest discomfort   . NSTEMI (non-ST elevated  myocardial infarction) ([place]) 09/27/2018  . Primary osteoarthritis of left knee 12/10/2017  . S/P knee replacement 12/10/2017  . Pain in left knee 10/20/2017  . Tear of right rotator cuff 05/20/2017  . Acromioclavicular arthrosis 08/01/2013  . Aortic insufficiency   . Dry eye syndrome 11/25/2007  . Asthma 09/13/2006  . Diabetes mellitus ([place]) 09/13/2006  . HTN (hypertension) 09/13/2006    Past Surgical History:  Procedure Laterality Date  . APPENDECTOMY    . BIOPSY  02/08/2020   Procedure: BIOPSY;  Surgeon: Juanito Doom, MD;  Location: [facility] ENDOSCOPY;  Service: Cardiopulmonary;;  . BIV ICD INSERTION CRT-D N/A 12/07/2019   Procedure: BIV ICD INSERTION CRT-D;  Surgeon: Evans Lance, MD;  Location: [place] CV LAB;  Service: Cardiovascular;  Laterality: N/A;  . BRONCHIAL WASHINGS  02/08/2020   Procedure: BRONCHIAL WASHINGS;  Surgeon: Juanito Doom, MD;  Location: [facility] ENDOSCOPY;  Service: Cardiopulmonary;;  . IABP INSERTION N/A 12/01/2019   Procedure: IABP Insertion;  Surgeon: Larey Dresser, MD;  Location: [place] CV LAB;  Service: Cardiovascular;  Laterality: N/A;  . INSERT / REPLACE / REMOVE PACEMAKER    . LEFT HEART CATH AND CORONARY ANGIOGRAPHY N/A 09/28/2018   Procedure: LEFT HEART CATH AND CORONARY ANGIOGRAPHY;  Surgeon: Troy Sine, MD;  Location: [place] CV LAB;  Service: Cardiovascular;  Laterality: N/A;  . PACEMAKER IMPLANT N/A 02/13/2019   Procedure: PACEMAKER IMPLANT;  Surgeon: Evans Lance, MD;  Location: [place] CV LAB;  Service: Cardiovascular;  Laterality: N/A;  . PACEMAKER IMPLANT    . RIGHT  HEART CATH N/A 02/09/2020   Procedure: RIGHT HEART CATH;  Surgeon: Larey Dresser, MD;  Location: [place] CV LAB;  Service: Cardiovascular;  Laterality: N/A;  . RIGHT/LEFT HEART CATH AND CORONARY ANGIOGRAPHY N/A 12/01/2019   Procedure: RIGHT/LEFT HEART CATH AND CORONARY ANGIOGRAPHY;  Surgeon: Larey Dresser, MD;  Location: [place] CV LAB;   Service: Cardiovascular;  Laterality: N/A;  . ROTATOR CUFF REPAIR Bilateral 2017  . SINUS EXPLORATION    . TOTAL KNEE ARTHROPLASTY Left 12/10/2017   Procedure: LEFT TOTAL KNEE ARTHROPLASTY;  Surgeon: Sydnee Cabal, MD;  Location: [facility] ORS;  Service: Orthopedics;  Laterality: Left;  Adductor Block  . TUBAL LIGATION    . VIDEO BRONCHOSCOPY N/A 02/08/2020   Procedure: VIDEO BRONCHOSCOPY WITH FLUORO;  Surgeon: Juanito Doom, MD;  Location: [place];  Service: Cardiopulmonary;  Laterality: N/A;     OB History    Gravida  5   Para  4   Term  1   Preterm  3   AB  1   Living  4     SAB  1   TAB  0   Ectopic  0   Multiple  0   Live Births  4           Family History  Problem Relation Age of Onset  . Colon cancer Maternal Grandmother   . Alzheimer's disease Mother     Social History   Tobacco Use  . Smoking status: Never Smoker  . Smokeless tobacco: Never Used  Vaping Use  . Vaping Use: Never used  Substance Use Topics  . Alcohol use: No  . Drug use: No    Home Medications Prior to Admission medications   Medication Sig Start Date End Date Taking? Authorizing Provider  acetaminophen (TYLENOL) 325 MG tablet Take 2 tablets (650 mg total) by mouth every 4 (four) hours as needed for headache or mild pain. 12/27/19   Shirley Friar, PA-C  albuterol (PROVENTIL HFA;VENTOLIN HFA) 108 (90 Base) MCG/ACT inhaler Inhale 1-2 puffs into the lungs every 6 (six) hours as needed for wheezing or shortness of breath. 07/30/18   Drenda Freeze, MD  amiodarone (PACERONE) 200 MG tablet Take 200 mg by mouth daily.    [provider]  apixaban (ELIQUIS) 5 MG TABS tablet Take 1 tablet (5 mg total) by mouth 2 (two) times daily. 01/26/20   Consuelo Pandy, PA-C  Biotin 10 MG CAPS Take 10 mg by mouth daily.    [provider]  carvedilol (COREG) 3.125 MG tablet Take 1 tablet (3.125 mg total) by mouth 2 (two) times daily with a meal. 03/14/20   Nipp,  Carriel T, MD  Cholecalciferol (VITAMIN D-3) 25 MCG (1000 UT) CAPS Take 1,000 Units by mouth daily.     [provider]  ENTRESTO 49-51 MG Take 1 tablet by mouth 2 (two) times daily. Hold 03/19/20   [provider]  FARXIGA 10 MG TABS tablet TAKE 1 TABLET(10 MG) BY MOUTH DAILY 02/22/20   Larey Dresser, MD  folic acid (FOLVITE) 1 MG tablet Take 1 tablet (1 mg total) by mouth daily. 12/27/19   Shirley Friar, PA-C  furosemide (LASIX) 20 MG tablet Take 1 tablet (20 mg total) by mouth every other day. 04/10/20   Larey Dresser, MD  gabapentin (NEURONTIN) 600 MG tablet Take 600 mg by mouth 2 (two) times daily.    [provider]  glipiZIDE (GLUCOTROL) 5 MG tablet Take  1 tablet (5 mg total) by mouth daily before breakfast. 04/10/20   Larey Dresser, MD  glucose blood (COOL BLOOD GLUCOSE TEST STRIPS) test strip Use as instructed 02/09/20   Lyda Jester M, PA-C  HYDROcodone-acetaminophen [facility]) 10-325 MG tablet Take by mouth as needed. 03/07/20   [provider]  metFORMIN (GLUCOPHAGE) 1000 MG tablet Take 1 tablet (1,000 mg total) by mouth 2 (two) times daily with a meal. 02/27/20   Larey Dresser, MD  methotrexate (RHEUMATREX) 2.5 MG tablet Take 1 tablet (2.5 mg total) by mouth once a week. Caution:Chemotherapy. Protect from light. 03/27/20   Larey Dresser, MD  mexiletine (MEXITIL) 150 MG capsule Take 1 capsule (150 mg total) by mouth 2 (two) times daily. 02/26/20   Vickie Epley, MD  Multiple Vitamins-Minerals (CENTRUM SILVER 50+WOMEN) TABS Take 1 tablet by mouth daily.    [provider]  omeprazole (PRILOSEC) 40 MG capsule Take 40 mg by mouth daily.    [provider]  pravastatin (PRAVACHOL) 10 MG tablet Take 10 mg by mouth daily.    [provider]  predniSONE (DELTASONE) 20 MG tablet Take 3 tablets (60 mg total) by mouth daily with breakfast. 02/10/20   Consuelo Pandy, PA-C  spironolactone (ALDACTONE) 25  MG tablet Take 1 tablet (25 mg total) by mouth daily. 03/14/20   Nipp, Carriel T, MD  sulfamethoxazole-trimethoprim (BACTRIM DS) 800-160 MG tablet Take 1 tablet by mouth 3 (three) times a week. 02/12/20   Lyda Jester M, PA-C  venlafaxine XR (EFFEXOR-XR) 150 MG 24 hr capsule Take 150 mg by mouth daily with breakfast.    [provider]    Allergies    Lisinopril, Ace inhibitors, Augmentin [amoxicillin-pot clavulanate], Benazepril, Oxycodone-acetaminophen, and Sulfa antibiotics  Review of Systems   Review of Systems  Constitutional: Positive for chills and fatigue. Negative for fever.  HENT: Negative for ear pain and sore throat.   Eyes: Negative for pain and visual disturbance.  Respiratory: Positive for shortness of breath. Negative for cough.   Cardiovascular: Positive for chest pain. Negative for palpitations.  Gastrointestinal: Negative for abdominal pain and vomiting.  Genitourinary: Negative for dysuria and hematuria.  Musculoskeletal: Negative for arthralgias and back pain.  Skin: Negative for color change and rash.  Neurological: Negative for seizures and syncope.  All other systems reviewed and are negative.   Physical Exam Updated Vital Signs BP (!) 96/57   Pulse (!) 102   Temp 99.7 F (37.6 C) (Oral)   Resp 15   Ht 5' (1.524 m)   Wt 64.9 kg   LMP 07/25/2015 (Exact Date)   SpO2 93%   BMI 27.93 kg/m   Physical Exam Vitals and nursing note reviewed.  Constitutional:      General: She is not in acute distress.    Appearance: She is well-developed.  HENT:     Head: Normocephalic and atraumatic.  Eyes:     Conjunctiva/sclera: Conjunctivae normal.  Cardiovascular:     Rate and Rhythm: Normal rate and regular rhythm.     Heart sounds: No murmur heard.   Pulmonary:     Effort: Pulmonary effort is normal. No respiratory distress.     Breath sounds: Normal breath sounds.     Comments: Breath sounds somewhat diminished at bases, not in distress, speaks  in full sentences.  Abdominal:     Palpations: Abdomen is soft.     Tenderness: There is no abdominal tenderness.  Musculoskeletal:     Cervical  back: Neck supple.     Right lower leg: No edema.     Left lower leg: No edema.  Skin:    General: Skin is warm and dry.  Neurological:     General: No focal deficit present.     Mental Status: She is alert.  Psychiatric:        Mood and Affect: Mood normal.        Behavior: Behavior normal.     ED Results / Procedures / Treatments   Labs (all labs ordered are listed, but only abnormal results are displayed) Labs Reviewed  BASIC METABOLIC PANEL - Abnormal; Notable for the following components:      Result Value   Sodium 129 (*)    Chloride 95 (*)    CO2 21 (*)    Glucose, Bld 212 (*)    BUN 21 (*)    All other components within normal limits  CBC - Abnormal; Notable for the following components:   RBC 3.81 (*)    MCV 102.4 (*)    RDW 20.3 (*)    nRBC 9.7 (*)    All other components within normal limits  MAGNESIUM - Abnormal; Notable for the following components:   Magnesium 1.5 (*)    All other components within normal limits  TROPONIN I (HIGH SENSITIVITY) - Abnormal; Notable for the following components:   Troponin I (High Sensitivity) 4,863 (*)    All other components within normal limits  RESP PANEL BY RT-PCR (FLU A&B, COVID) ARPGX2  BRAIN NATRIURETIC PEPTIDE  PATHOLOGIST SMEAR REVIEW  I-STAT BETA HCG BLOOD, ED ([facility], [facility], [facility] ONLY)  TROPONIN I (HIGH SENSITIVITY)    EKG EKG Interpretation  Date/Time:  Monday April 15 2020 14:18:32 EST Ventricular Rate:  110 PR Interval:    QRS Duration: 150 QT Interval:  434 QTC Calculation: 598 R Axis:   -54 Text Interpretation: VENTRICULAR PACED RHYTHM no acute STEMI Confirmed by Madalyn Rob [phone]) on 05/02/2020 2:54:42 PM   Radiology DG Chest 2 View  Result Date: 05/03/2020 CLINICAL DATA:  Shortness of breath. EXAM: CHEST - 2 VIEW COMPARISON:  Chest x-ray dated  March 11, 2020. FINDINGS: Unchanged left chest wall pacemaker. The heart size and mediastinal contours are within normal limits. Mild peripheral patchy airspace and interstitial opacities in the right mid and left lower lungs. No pleural effusion or pneumothorax. No acute osseous abnormality. IMPRESSION: 1. Mild peripheral patchy airspace and interstitial opacities in both lungs could reflect pulmonary edema or atypical infection, including viral pneumonia. Electronically Signed   By: Titus Dubin M.D.   On: 05/05/2020 14:12    Procedures .Critical Care Performed by: Lucrezia Starch, MD Authorized by: Lucrezia Starch, MD   Critical care provider statement:    Critical care time (minutes):  36   Critical care was necessary to treat or prevent imminent or life-threatening deterioration of the following conditions:  Respiratory failure, cardiac failure and circulatory failure   Critical care was time spent personally by me on the following activities:  Discussions with consultants, evaluation of patient's response to treatment, examination of patient, ordering and performing treatments and interventions, ordering and review of laboratory studies, ordering and review of radiographic studies, pulse oximetry, re-evaluation of patient's condition, obtaining history from patient or surrogate and review of old charts   (including critical care time)  Medications Ordered in ED Medications  sodium chloride 0.9 % bolus 500 mL (0 mLs Intravenous Hold 04/10/2020 1455)  fentaNYL (SUBLIMAZE) injection 50 mcg (  50 mcg Intravenous Given 04/14/2020 1524)    ED Course  I have reviewed the triage vital signs and the nursing notes.  Pertinent labs & imaging results that were available during my care of the patient were reviewed by me and considered in my medical decision making (see chart for details).    MDM Rules/Calculators/A&P                         54 year old lady presents to ER with concern for  shortness of breath, chest discomfort.  Very complex medical history, recent complex hospital admission for ventricular tachycardia, cardiogenic shock, heart failure.  In ER, patient having some pain but not in acute distress.  BP mildly soft but had responded prior to giving any fluids.  Giving her complex heart failure history, very hesitant to fluid overload.  CXR with possible pneumonia.  EKG negative for acute STEMI.  Given pain significantly worse with deep breaths, concern for possible pulmonary embolism.  Will check CTA chest.  At time of signout, troponin came back and was noted to be profoundly elevated.  Will review this finding with cardiology.  Dr. Sherry Ruffing will follow up on recommendations from cardiology, CTA chest and reassessment.    Final Clinical Impression(s) / ED Diagnoses Final diagnoses:  Chest pain, unspecified type  Elevated troponin  Hyponatremia    Rx / DC Orders ED Discharge Orders    None       Lucrezia Starch, MD 04/13/2020 1541

## 2020-04-15 NOTE — Consult Note (Addendum)
Advanced Heart Failure Team Consult Note   Primary Physician: Milford Cage, PA PCP-Cardiologist:  No primary care provider on file.  Reason for Consultation: A/C Systolic Heart [place] Michaels   HPI:    Lisa Poole is seen today for evaluation of A/C Systolic Heart Failure at the request of Dr Roslynn Amble.   Lisa Poole is a 54 year old with a history ofh/o HTN, HLD, T2DM, CHB s/p PPM implant 02/2019 paroxysmal atrial fibrillation,cardiac sarcoid, VT,COVID-19infection in April 7096 and systolic heart failure/ nonischemic cardiomyopathy.   In May 2020, she underwent w/u for SOB and mildly elevated troponin I (0.09>>0.10>>0.12>>0.15). LHC 09/2018 showed no significant coronary disease.Therewas minimal systolic bridging of 5 to 10% in the mid LAD. The left circumflex vesselwas normal. The RCA is a dominant vessel with a superior takeoff andwas normal.Echo 5/2020showedLVEF 60-65%, mild LVH, RV normal. Mild- mod AI, noaorticstenosis.  In 02/2019, she presented w/ chest pain and found to be in CHB and underwentPPMimplant ([employer]).   In April 2021, she was diagnosed w/ COVID-19but did not require hospitalization.  [facility] complaint of progressive dyspnea, intermittent CP, orthopnea, PND and LEE. Symptoms present for several weeks.In ED, found to be in acute CHF.BNP 1,258. CXR w/ developing edema.HS troponin 32>>52>>67.EKG and tele w/ WCT, felt to be VT. Rates in the 160s-170s. Started on IV amiodarone in [place] conversion to NSR.Echowasrepeatedin 7/21 andshowednew drop in LVEF, down to 20-25% w/ regional wall motion abnormalties,most prominent in anterior, septal, and lateral walls. Moderate to severe TR with elevated RVSP. Moderate MR. LV thrombus excluded by definity contrast. RV systolic function normal.She developed worsening HF/ cardiogenic shock w/ low Co-ox in the 40s, required inotropic support + placement of IABP. IV  amiodaronecontinuedfor VT suppression. Developed Torsades/VT x 3 on 7/24/21with prolonged QT required DC-CV. Amio switched to lido and later started on mexiletine.Had repeat LHC in 7/21that showed no significant coronary disease. cMRI c/w possible cardiac sarcoidosis vs prior myocarditis. She was placed on empiric prednisone for possible sarcoid w/ plans to refer to [facility] for cardiac PET scan. Of note, chest CT 7/21 with ground glass lower lobes but nodefiniteevidence for pulmonary sarcoidosis. She underwent device upgradeto CRT-D prior to d/c. She went home on prednisone.   She was admitted in 8/21 with recurrent VT when prednisone was weaned down some. Prednisone was increased and methotrexate added, home on mexiletine.   PET scan at [facility] in 8/21 was concerning for active cardiac inflammation consistent with cardiac sarcoidosis.   Back again with VT in 9/21, again with taper of prednisone. Amiodarone was restarted and mexiletine was stopped, prednisone increased again. Transbronchial biopsy did not show evidence for pulmonary sarcoidosis. Echo in 9/21 showed EF up some to 35-40% with normal RV. RHC in 10/21 showed low filling pressures and preserved cardiac output.   Presented to [facility] 03/11/20 with VT after ICD shock x 3.  Started on amio drip and placed on Neo due to hypotension. Transferred to [facility] for transplant work up and possible myocardial biopsy on 03/13/2020.   [place] 11/3 through 03/22/2020--->  Imaging consistent with cardiac sarcoid. PET demonstrated decreasing inflammation which is consistent with sarcoid (given improved after additional IS). At this time, no plans for confirmatory cardiac biopsy of sarcoid given PET scans have been consistent with diagnosis. Underwent transplant eval and discussed as listing conference. Given that she has not had any admissions for heart failure, and her VT is currently controlled with medications only, she will not be listed for  transplant at this  time. She is an advanced therapies candidate, and should maintain a relationship with an advanced heart failure provider at [facility] for consideration of future therapies. Several medications were dose reduced due to hypotension and hyperkalemia, further suggesting that she may need advanced therapies someday. Rheumatology consulted. Continued prednisone with Bactrim PJP ppx. MTX increased. Continued folic acid and Vitamin D supplements. Started infliximab while admitted. Rheumatology arranged for outpatient infliximab infusions and follow-up. Discharged on methotrexate 25 mg weekly + prednisone 60 mg daily +12.5 mg spironolactone + amio 200/200, + farxiga 10 mg daily. She was taken off bb and ARNi.   Saw Dr Aundra Dubin 03/27/20 and she was doing ok. Lasix 20 mg every other day started. Plan for slow taper of prednisone.   About 10 days ago she developed chest soreness across her chest. Pain resolved with repositioning. Pain has been increasing in frequency. Today she woke up at 530 am with chest tightness . No improvement with repositioning so she took one hydrocodone but had no relief.    Today she presented to the ED with increased shortness of breath and chest tightness across her chest. CXR -possible pulmonary edema or atypical infection. Pertinent labs included: K 3.8, creatinine 0.7, hgb 12.8, BNP BNP 492, HS Trop 4332>9518. Received fentanyl with some improvement. Any movement makes it worse. Sent for CTA.   SOB with inspiration.   Recent Imaging  PET 03/19/2020 Active Inflammation consistent with previous.  CMRI 03/15/2020 LVEF 27%  RV 40%  Echo 02/07/2020 EF 35-40% RV norma Grade 1 DD LHC 12/01/19 no significant coronary disease.   Review of Systems: [y] = yes, [ ]  = no   . General: Weight gain [ ] ; Weight loss [ ] ; Anorexia [ ] ; Fatigue [ Y]; Fever [ ] ; Chills [ ] ; Weakness [ Y]  . Cardiac: Chest pain/pressure [ ] ; Resting SOB [ ] ; Exertional SOB [ Y]; Orthopnea [ ] ; Pedal Edema [ ] ; Palpitations [  ]; Syncope [ ] ; Presyncope [ ] ; Paroxysmal nocturnal dyspnea[ ]   . Pulmonary: Cough [ ] ; Wheezing[ ] ; Hemoptysis[ ] ; Sputum [ ] ; Snoring [ ]   . GI: Vomiting[ ] ; Dysphagia[ ] ; Melena[ ] ; Hematochezia [ ] ; Heartburn[ ] ; Abdominal pain [ ] ; Constipation [ ] ; Diarrhea [ ] ; BRBPR [ ]   . GU: Hematuria[ ] ; Dysuria [ ] ; Nocturia[ ]   . Vascular: Pain in legs with walking [ ] ; Pain in feet with lying flat [ ] ; Non-healing sores [ ] ; Stroke [ ] ; TIA [ ] ; Slurred speech [ ] ;  . Neuro: Headaches[ ] ; Vertigo[ ] ; Seizures[ ] ; Paresthesias[ ] ;Blurred vision [ ] ; Diplopia [ ] ; Vision changes [ ]   . Ortho/Skin: Arthritis [ ] ; Joint pain [Y ]; Muscle pain [ ] ; Joint swelling [ ] ; Back Pain [Y ]; Rash [ ]   . Psych: Depression[ ] ; Anxiety[Y ]  . Heme: Bleeding problems [ ] ; Clotting disorders [ ] ; Anemia [ ]   . Endocrine: Diabetes [Y ]; Thyroid dysfunction[ ]   Home Medications Prior to Admission medications   Medication Sig Start Date End Date Taking? Authorizing Provider  acetaminophen (TYLENOL) 325 MG tablet Take 2 tablets (650 mg total) by mouth every 4 (four) hours as needed for headache or mild pain. 12/27/19   Shirley Friar, PA-C  albuterol (PROVENTIL HFA;VENTOLIN HFA) 108 (90 Base) MCG/ACT inhaler Inhale 1-2 puffs into the lungs every 6 (six) hours as needed for wheezing or shortness of breath. 07/30/18   Drenda Freeze, MD  amiodarone (PACERONE) 200 MG tablet  Take 200 mg by mouth daily.    [provider]  apixaban (ELIQUIS) 5 MG TABS tablet Take 1 tablet (5 mg total) by mouth 2 (two) times daily. 01/26/20   Consuelo Pandy, PA-C  Biotin 10 MG CAPS Take 10 mg by mouth daily.    [provider]  carvedilol (COREG) 3.125 MG tablet Take 1 tablet (3.125 mg total) by mouth 2 (two) times daily with a meal. 03/14/20   Nipp, Carriel T, MD  Cholecalciferol (VITAMIN D-3) 25 MCG (1000 UT) CAPS Take 1,000 Units by mouth daily.     [provider]  ENTRESTO 49-51 MG Take 1 tablet  by mouth 2 (two) times daily. Hold 03/19/20   [provider]  FARXIGA 10 MG TABS tablet TAKE 1 TABLET(10 MG) BY MOUTH DAILY 02/22/20   Larey Dresser, MD  folic acid (FOLVITE) 1 MG tablet Take 1 tablet (1 mg total) by mouth daily. 12/27/19   Shirley Friar, PA-C  furosemide (LASIX) 20 MG tablet Take 1 tablet (20 mg total) by mouth every other day. 04/10/20   Larey Dresser, MD  gabapentin (NEURONTIN) 600 MG tablet Take 600 mg by mouth 2 (two) times daily.    [provider]  glipiZIDE (GLUCOTROL) 5 MG tablet Take 1 tablet (5 mg total) by mouth daily before breakfast. 04/10/20   Larey Dresser, MD  glucose blood (COOL BLOOD GLUCOSE TEST STRIPS) test strip Use as instructed 02/09/20   Lyda Jester M, PA-C  HYDROcodone-acetaminophen (NORCO) 10-325 MG tablet Take by mouth as needed. 03/07/20   [provider]  metFORMIN (GLUCOPHAGE) 1000 MG tablet Take 1 tablet (1,000 mg total) by mouth 2 (two) times daily with a meal. 02/27/20   Larey Dresser, MD  methotrexate (RHEUMATREX) 2.5 MG tablet Take 1 tablet (2.5 mg total) by mouth once a week. Caution:Chemotherapy. Protect from light. 03/27/20   Larey Dresser, MD  mexiletine (MEXITIL) 150 MG capsule Take 1 capsule (150 mg total) by mouth 2 (two) times daily. 02/26/20   Vickie Epley, MD  Multiple Vitamins-Minerals (CENTRUM SILVER 50+WOMEN) TABS Take 1 tablet by mouth daily.    [provider]  omeprazole (PRILOSEC) 40 MG capsule Take 40 mg by mouth daily.    [provider]  pravastatin (PRAVACHOL) 10 MG tablet Take 10 mg by mouth daily.    [provider]  predniSONE (DELTASONE) 20 MG tablet Take 3 tablets (60 mg total) by mouth daily with breakfast. 02/10/20   Consuelo Pandy, PA-C  spironolactone (ALDACTONE) 25 MG tablet Take 1 tablet (25 mg total) by mouth daily. 03/14/20   Nipp, Carriel T, MD  sulfamethoxazole-trimethoprim (BACTRIM DS) 800-160 MG tablet Take 1 tablet by  mouth 3 (three) times a week. 02/12/20   Lyda Jester M, PA-C  venlafaxine XR (EFFEXOR-XR) 150 MG 24 hr capsule Take 150 mg by mouth daily with breakfast.    [provider]    Past Medical History: Past Medical History:  Diagnosis Date  . AICD (automatic cardioverter/defibrillator) present   . Aortic insufficiency   . Asthma 09/13/2006  . Complete heart block ([place]) 02/11/2019  . Cystocele without uterine prolapse 12/12/2018  . Diabetes mellitus without complication ([place])   . Elevated troponin level   . Hypercholesteremia   . Hypertension   . Presence of permanent cardiac pacemaker   . Primary osteoarthritis of left knee 12/10/2017    Past Surgical History: Past Surgical History:  Procedure Laterality Date  .  APPENDECTOMY    . BIOPSY  02/08/2020   Procedure: BIOPSY;  Surgeon: Juanito Doom, MD;  Location: [facility] ENDOSCOPY;  Service: Cardiopulmonary;;  . BIV ICD INSERTION CRT-D N/A 12/07/2019   Procedure: BIV ICD INSERTION CRT-D;  Surgeon: Evans Lance, MD;  Location: [place] CV LAB;  Service: Cardiovascular;  Laterality: N/A;  . BRONCHIAL WASHINGS  02/08/2020   Procedure: BRONCHIAL WASHINGS;  Surgeon: Juanito Doom, MD;  Location: [facility] ENDOSCOPY;  Service: Cardiopulmonary;;  . IABP INSERTION N/A 12/01/2019   Procedure: IABP Insertion;  Surgeon: Larey Dresser, MD;  Location: [place] CV LAB;  Service: Cardiovascular;  Laterality: N/A;  . INSERT / REPLACE / REMOVE PACEMAKER    . LEFT HEART CATH AND CORONARY ANGIOGRAPHY N/A 09/28/2018   Procedure: LEFT HEART CATH AND CORONARY ANGIOGRAPHY;  Surgeon: Troy Sine, MD;  Location: [place] CV LAB;  Service: Cardiovascular;  Laterality: N/A;  . PACEMAKER IMPLANT N/A 02/13/2019   Procedure: PACEMAKER IMPLANT;  Surgeon: Evans Lance, MD;  Location: [place] CV LAB;  Service: Cardiovascular;  Laterality: N/A;  . PACEMAKER IMPLANT    . RIGHT HEART CATH N/A 02/09/2020   Procedure: RIGHT HEART CATH;  Surgeon:  Larey Dresser, MD;  Location: [place] CV LAB;  Service: Cardiovascular;  Laterality: N/A;  . RIGHT/LEFT HEART CATH AND CORONARY ANGIOGRAPHY N/A 12/01/2019   Procedure: RIGHT/LEFT HEART CATH AND CORONARY ANGIOGRAPHY;  Surgeon: Larey Dresser, MD;  Location: [place] CV LAB;  Service: Cardiovascular;  Laterality: N/A;  . ROTATOR CUFF REPAIR Bilateral 2017  . SINUS EXPLORATION    . TOTAL KNEE ARTHROPLASTY Left 12/10/2017   Procedure: LEFT TOTAL KNEE ARTHROPLASTY;  Surgeon: Sydnee Cabal, MD;  Location: [facility] ORS;  Service: Orthopedics;  Laterality: Left;  Adductor Block  . TUBAL LIGATION    . VIDEO BRONCHOSCOPY N/A 02/08/2020   Procedure: VIDEO BRONCHOSCOPY WITH FLUORO;  Surgeon: Juanito Doom, MD;  Location: [place];  Service: Cardiopulmonary;  Laterality: N/A;    Family History: Family History  Problem Relation Age of Onset  . Colon cancer Maternal Grandmother   . Alzheimer's disease Mother     Social History: Social History   Socioeconomic History  . Marital status: Married    Spouse name: Not on file  . Number of children: Not on file  . Years of education: Not on file  . Highest education level: Not on file  Occupational History  . Not on file  Tobacco Use  . Smoking status: Never Smoker  . Smokeless tobacco: Never Used  Vaping Use  . Vaping Use: Never used  Substance and Sexual Activity  . Alcohol use: No  . Drug use: No  . Sexual activity: Yes    Birth control/protection: Post-menopausal  Other Topics Concern  . Not on file  Social History Narrative  . Not on file   Social Determinants of Health   Financial Resource Strain:   . Difficulty of Paying Living Expenses: Not on file  Food Insecurity:   . Worried About Charity fundraiser in the Last Year: Not on file  . Ran Out of Food in the Last Year: Not on file  Transportation Needs:   . Lack of Transportation (Medical): Not on file  . Lack of Transportation (Non-Medical): Not on file   Physical Activity:   . Days of Exercise per Week: Not on file  . Minutes of Exercise per Session: Not on file  Stress:   . Feeling of  Stress : Not on file  Social Connections:   . Frequency of Communication with Friends and Family: Not on file  . Frequency of Social Gatherings with Friends and Family: Not on file  . Attends Religious Services: Not on file  . Active Member of Clubs or Organizations: Not on file  . Attends Archivist Meetings: Not on file  . Marital Status: Not on file    Allergies:  Allergies  Allergen Reactions  . Lisinopril Other (See Comments) and Cough    Flu like symptoms, also   . Ace Inhibitors Other (See Comments)    Flu-like symptoms  . Augmentin [Amoxicillin-Pot Clavulanate] Nausea And Vomiting  . Benazepril Other (See Comments)    Flu like symptoms  . Oxycodone-Acetaminophen Nausea And Vomiting  . Sulfa Antibiotics Nausea And Vomiting    Objective:    Vital Signs:   Temp:  [99.7 F (37.6 C)] 99.7 F (37.6 C) (12/06 1329) Pulse Rate:  [88-113] 88 (12/06 1600) Resp:  [12-23] 19 (12/06 1600) BP: (73-166)/(57-119) 91/71 (12/06 1600) SpO2:  [91 %-98 %] 92 % (12/06 1600) Weight:  [64.9 kg] 64.9 kg (12/06 1442)    Weight change: Filed Weights   04/29/2020 1442  Weight: 64.9 kg    Intake/Output:  No intake or output data in the 24 hours ending 04/26/2020 1630    Physical Exam    General:  Appears weak.  No resp difficulty HEENT: normal Neck: supple. JVP 9-10 . Carotids 2+ bilat; no bruits. No lymphadenopathy or thyromegaly appreciated. Cor: PMI nondisplaced. Regular rate & rhythm. No rubs, gallops or murmurs. Lungs: clear Abdomen: soft, nontender, nondistended. No hepatosplenomegaly. No bruits or masses. Good bowel sounds. Extremities: no cyanosis, clubbing, rash, trace edema Neuro: alert & orientedx3, cranial nerves grossly intact. moves all 4 extremities w/o difficulty. Affect pleasant   Telemetry  SR 80s with frequent  episdoes for NSVT   EKG    V paced with PVCs   Labs   Basic Metabolic Panel: Recent Labs  Lab 04/16/2020 1337 04/13/2020 1412  NA 129*  --   K 3.8  --   CL 95*  --   CO2 21*  --   GLUCOSE 212*  --   BUN 21*  --   CREATININE 0.74  --   CALCIUM 8.9  --   MG  --  1.5*    Liver Function Tests: No results for input(s): AST, ALT, ALKPHOS, BILITOT, PROT, ALBUMIN in the last 168 hours. No results for input(s): LIPASE, AMYLASE in the last 168 hours. No results for input(s): AMMONIA in the last 168 hours.  CBC: Recent Labs  Lab 05/04/2020 1337  WBC 6.5  HGB 12.8  HCT 39.0  MCV 102.4*  PLT 215    Cardiac Enzymes: No results for input(s): CKTOTAL, CKMB, CKMBINDEX, TROPONINI in the last 168 hours.  BNP: BNP (last 3 results) Recent Labs    11/26/19 2011 02/06/20 0725 04/13/2020 1350  BNP 1,258.1* 344.3* 492.8*    ProBNP (last 3 results) No results for input(s): PROBNP in the last 8760 hours.   CBG: No results for input(s): GLUCAP in the last 168 hours.  Coagulation Studies: No results for input(s): LABPROT, INR in the last 72 hours.   Imaging   DG Chest 2 View  Result Date: 04/21/2020 CLINICAL DATA:  Shortness of breath. EXAM: CHEST - 2 VIEW COMPARISON:  Chest x-ray dated March 11, 2020. FINDINGS: Unchanged left chest wall pacemaker. The heart size and mediastinal contours are within  normal limits. Mild peripheral patchy airspace and interstitial opacities in the right mid and left lower lungs. No pleural effusion or pneumothorax. No acute osseous abnormality. IMPRESSION: 1. Mild peripheral patchy airspace and interstitial opacities in both lungs could reflect pulmonary edema or atypical infection, including viral pneumonia. Electronically Signed   By: Titus Dubin M.D.   On: 04/13/2020 14:12      Medications:     Current Medications:   Infusions: . sodium chloride Stopped (05/07/2020 1455)        Assessment/Plan   1. Dyspnea -HS Trop >  4000 -Sent for CTA--> Nodules ? Septic emboli.   2. Chest Pain  -Elevated HS Trop >4000  -Check sed rate. ? Myocarditis  3. Hypotension  -Check blood cultures x2. May need to add Neo if BP remains low.  -WBC 6.5   4. A/C Systolic Heart Failure 11/3530 LHC with no significant coronaries.  -Cardiac PET in 8/21 was showed active inflammation in the heart, concerning again for cardiac sarcoidosis Repeat PET in 11/21 showed ongoing inflammation but improved. Most recent echo in 11/21 with EF 35%, but cMRI in 11/21 showed LV EF 27%. - Volume status mildly elevated. Give 40 mg IV lasix x 1.  -Add midodrine 5 mg three times a day.  -Hold HF meds with hypotension.   4. Cardiac Sarcoid -Cardiac Pet and CMRI  Both suggestive of cardiac sarcoid. Sent to [facility] 03/13/20 for possible biopsy and transplant.  Cardiac MRI and cardiac PET both suggestive of cardiac sarcoidosis, and unexplained CHB also concerning, as is VT.  She has had VT with prednisone weaning.  Suspect isolated cardiac sarcoidosis.  Transbronchial biopsy did not show granulomatous disease.  Seen at [facility], they agree with diagnosis. Repeat cardiac PET in 11/21 showed cardiac inflammation still present but improved.  -Continue prednisone 60 mg daily - Continue methotrexate once a week, she is on goal dose 25 mg weekly.  Will need to follow CBC, LFTsmonthly.  - She is on Bactrim for PJP prophylaxis and folate. Follow K and creatinine closely.  - She is now on infliximab per rheumatology at [facility]. She has had 1 infusion . 5. H/O VT  Has had multiple morphologies  Interrogate ICD . ATP several different times on 11/23/21and 04/05/20  7000 episodes NSVT on 05/03/2020  Continue amio + mexiletine 150 mg twice a day    6. H/O CHB Has CRT  7. Thrush Need to continue diflucan.   8. H/O RIJ DVT On eliquis 5 mg twice a day . Will stop and start heparin drip in the event she may need procedures. Consult pharmacy.   7 DMII   Length of  Stay: 0  Darrick Grinder, NP  04/21/2020, 4:30 PM  Advanced Heart Failure Team Pager [phone] (M-F; [place])  Please contact [facility] for night-coverage after hours (4p -7a ) and weekends on [email or web address]  Patient seen and examined with the above-signed Advanced Practice Provider and/or Housestaff. I personally reviewed laboratory data, imaging studies and relevant notes. I independently examined the patient and formulated the important aspects of the plan. I have edited the note to reflect any of my changes or salient points. I have personally discussed the plan with the patient and/or family.  54 y/o woman with h/o isolated cardiac sarcoid with recurrent VT, chronic systolic HF with EF 34% due to NICM  Has had multiple recent admission for VT. Seen at [facility] for possible transplant eval. Has been maintained on prednisone/MTX/bactrim for sarcoid and immune  suppression.   Presents today with pleuritic CP. Troponins at 4K and stable. CT chest reviewed personally. Innumerable new pulmonary nodules some with cavitation concerning for septic emboli. No f/c. WBC 6.5k.   ICD interrogation: frequent NSVT no ICD shock. Mild volume overload  General:  Weak appearing. No resp difficulty HEENT: normal Neck: supple. JVP 7Carotids 2+ bilat; no bruits. No lymphadenopathy or thryomegaly appreciated. Cor: PMI nondisplaced. Regular rate & rhythm. No rubs, gallops or murmurs. Lungs: clear Abdomen: soft, nontender, nondistended. No hepatosplenomegaly. No bruits or masses. Good bowel sounds. Extremities: no cyanosis, clubbing, rash, edema Neuro: alert & orientedx3, cranial nerves grossly intact. moves all 4 extremities w/o difficulty. Affect pleasant  CT reviewed personally. D/w with Pulmonary.   I suspect diffuse septic emboli in setting of immunosuppression for sarcoid. Recommend: - Admit to Medicine - ID consult re: empiric abx/antifungal coverage - Check Bcx, ESR, viral panel - Echo +/- TEE to exclude  device infection - May need bronch with biopsy - If ICD infected will be problematic given recurrent VT and need for ICD shocks recently. - Keep K> 4.0, Mg >2.0 - Continue amio and mag - Switch Eliquis to heparin (Discussed  with PharmD personally) - We will follow closely.    Glori Bickers, MD  6:58 PM

## 2020-04-16 ENCOUNTER — Observation Stay (HOSPITAL_COMMUNITY): Payer: Medicare Other

## 2020-04-16 ENCOUNTER — Inpatient Hospital Stay (HOSPITAL_COMMUNITY): Payer: Medicare Other

## 2020-04-16 ENCOUNTER — Encounter (HOSPITAL_COMMUNITY): Payer: Self-pay | Admitting: Family Medicine

## 2020-04-16 DIAGNOSIS — I110 Hypertensive heart disease with heart failure: Secondary | ICD-10-CM | POA: Diagnosis present

## 2020-04-16 DIAGNOSIS — R0781 Pleurodynia: Secondary | ICD-10-CM

## 2020-04-16 DIAGNOSIS — R918 Other nonspecific abnormal finding of lung field: Secondary | ICD-10-CM | POA: Diagnosis present

## 2020-04-16 DIAGNOSIS — R5381 Other malaise: Secondary | ICD-10-CM

## 2020-04-16 DIAGNOSIS — R002 Palpitations: Secondary | ICD-10-CM

## 2020-04-16 DIAGNOSIS — R5383 Other fatigue: Secondary | ICD-10-CM

## 2020-04-16 DIAGNOSIS — D849 Immunodeficiency, unspecified: Secondary | ICD-10-CM | POA: Diagnosis present

## 2020-04-16 DIAGNOSIS — R21 Rash and other nonspecific skin eruption: Secondary | ICD-10-CM

## 2020-04-16 DIAGNOSIS — J9601 Acute respiratory failure with hypoxia: Secondary | ICD-10-CM | POA: Diagnosis present

## 2020-04-16 DIAGNOSIS — I5022 Chronic systolic (congestive) heart failure: Secondary | ICD-10-CM

## 2020-04-16 DIAGNOSIS — E871 Hypo-osmolality and hyponatremia: Secondary | ICD-10-CM | POA: Diagnosis present

## 2020-04-16 DIAGNOSIS — Z66 Do not resuscitate: Secondary | ICD-10-CM | POA: Diagnosis not present

## 2020-04-16 DIAGNOSIS — I21A1 Myocardial infarction type 2: Secondary | ICD-10-CM | POA: Diagnosis present

## 2020-04-16 DIAGNOSIS — I428 Other cardiomyopathies: Secondary | ICD-10-CM | POA: Diagnosis present

## 2020-04-16 DIAGNOSIS — Z20822 Contact with and (suspected) exposure to covid-19: Secondary | ICD-10-CM | POA: Diagnosis present

## 2020-04-16 DIAGNOSIS — D692 Other nonthrombocytopenic purpura: Secondary | ICD-10-CM | POA: Diagnosis present

## 2020-04-16 DIAGNOSIS — X58XXXA Exposure to other specified factors, initial encounter: Secondary | ICD-10-CM | POA: Diagnosis present

## 2020-04-16 DIAGNOSIS — I76 Septic arterial embolism: Secondary | ICD-10-CM | POA: Diagnosis present

## 2020-04-16 DIAGNOSIS — I4901 Ventricular fibrillation: Secondary | ICD-10-CM | POA: Diagnosis not present

## 2020-04-16 DIAGNOSIS — G931 Anoxic brain damage, not elsewhere classified: Secondary | ICD-10-CM | POA: Diagnosis not present

## 2020-04-16 DIAGNOSIS — I340 Nonrheumatic mitral (valve) insufficiency: Secondary | ICD-10-CM | POA: Diagnosis not present

## 2020-04-16 DIAGNOSIS — R079 Chest pain, unspecified: Secondary | ICD-10-CM | POA: Insufficient documentation

## 2020-04-16 DIAGNOSIS — R778 Other specified abnormalities of plasma proteins: Secondary | ICD-10-CM | POA: Diagnosis not present

## 2020-04-16 DIAGNOSIS — I472 Ventricular tachycardia: Secondary | ICD-10-CM

## 2020-04-16 DIAGNOSIS — D8689 Sarcoidosis of other sites: Secondary | ICD-10-CM | POA: Diagnosis present

## 2020-04-16 DIAGNOSIS — R0602 Shortness of breath: Secondary | ICD-10-CM | POA: Diagnosis not present

## 2020-04-16 DIAGNOSIS — I5023 Acute on chronic systolic (congestive) heart failure: Secondary | ICD-10-CM | POA: Diagnosis not present

## 2020-04-16 DIAGNOSIS — Z8616 Personal history of COVID-19: Secondary | ICD-10-CM | POA: Diagnosis not present

## 2020-04-16 DIAGNOSIS — I471 Supraventricular tachycardia: Secondary | ICD-10-CM | POA: Diagnosis present

## 2020-04-16 DIAGNOSIS — I442 Atrioventricular block, complete: Secondary | ICD-10-CM | POA: Diagnosis present

## 2020-04-16 DIAGNOSIS — I959 Hypotension, unspecified: Secondary | ICD-10-CM | POA: Diagnosis not present

## 2020-04-16 DIAGNOSIS — D84821 Immunodeficiency due to drugs: Secondary | ICD-10-CM | POA: Diagnosis present

## 2020-04-16 DIAGNOSIS — E09649 Drug or chemical induced diabetes mellitus with hypoglycemia without coma: Secondary | ICD-10-CM | POA: Diagnosis not present

## 2020-04-16 DIAGNOSIS — I462 Cardiac arrest due to underlying cardiac condition: Secondary | ICD-10-CM | POA: Diagnosis not present

## 2020-04-16 DIAGNOSIS — I351 Nonrheumatic aortic (valve) insufficiency: Secondary | ICD-10-CM | POA: Diagnosis not present

## 2020-04-16 DIAGNOSIS — I898 Other specified noninfective disorders of lymphatic vessels and lymph nodes: Secondary | ICD-10-CM | POA: Diagnosis present

## 2020-04-16 DIAGNOSIS — Z515 Encounter for palliative care: Secondary | ICD-10-CM | POA: Diagnosis not present

## 2020-04-16 DIAGNOSIS — R570 Cardiogenic shock: Secondary | ICD-10-CM | POA: Diagnosis not present

## 2020-04-16 DIAGNOSIS — D8685 Sarcoid myocarditis: Secondary | ICD-10-CM

## 2020-04-16 DIAGNOSIS — Z79899 Other long term (current) drug therapy: Secondary | ICD-10-CM

## 2020-04-16 DIAGNOSIS — B370 Candidal stomatitis: Secondary | ICD-10-CM | POA: Diagnosis present

## 2020-04-16 LAB — BASIC METABOLIC PANEL
Anion gap: 12 (ref 5–15)
BUN: 25 mg/dL — ABNORMAL HIGH (ref 6–20)
CO2: 19 mmol/L — ABNORMAL LOW (ref 22–32)
Calcium: 8.5 mg/dL — ABNORMAL LOW (ref 8.9–10.3)
Chloride: 96 mmol/L — ABNORMAL LOW (ref 98–111)
Creatinine, Ser: 0.79 mg/dL (ref 0.44–1.00)
GFR, Estimated: >60 mL/min (ref >60)
Glucose, Bld: 465 mg/dL — ABNORMAL HIGH (ref 70–99)
Potassium: 5.3 mmol/L — ABNORMAL HIGH (ref 3.5–5.1)
Sodium: 127 mmol/L — ABNORMAL LOW (ref 135–145)

## 2020-04-16 LAB — COMPREHENSIVE METABOLIC PANEL
ALT: 36 U/L (ref 0–44)
AST: 67 U/L — ABNORMAL HIGH (ref 15–41)
Albumin: 2.6 g/dL — ABNORMAL LOW (ref 3.5–5.0)
Alkaline Phosphatase: 45 U/L (ref 38–126)
Anion gap: 15 (ref 5–15)
BUN: 19 mg/dL (ref 6–20)
CO2: 20 mmol/L — ABNORMAL LOW (ref 22–32)
Calcium: 8.4 mg/dL — ABNORMAL LOW (ref 8.9–10.3)
Chloride: 99 mmol/L (ref 98–111)
Creatinine, Ser: 0.59 mg/dL (ref 0.44–1.00)
GFR, Estimated: >60 mL/min (ref >60)
Glucose, Bld: 84 mg/dL (ref 70–99)
Potassium: 3.4 mmol/L — ABNORMAL LOW (ref 3.5–5.1)
Sodium: 134 mmol/L — ABNORMAL LOW (ref 135–145)
Total Bilirubin: 1 mg/dL (ref 0.3–1.2)
Total Protein: 5.6 g/dL — ABNORMAL LOW (ref 6.5–8.1)

## 2020-04-16 LAB — HEPARIN LEVEL (UNFRACTIONATED)
Heparin Unfractionated: >2.2 IU/mL — ABNORMAL HIGH (ref 0.30–0.70)
Heparin Unfractionated: >2.2 IU/mL — ABNORMAL HIGH (ref 0.30–0.70)

## 2020-04-16 LAB — MAGNESIUM
Magnesium: 2.5 mg/dL — ABNORMAL HIGH (ref 1.7–2.4)
Magnesium: 2.2 mg/dL (ref 1.7–2.4)

## 2020-04-16 LAB — ECHOCARDIOGRAM COMPLETE
AR max vel: 1.6 cm2
AV Area VTI: 1.52 cm2
AV Area mean vel: 1.74 cm2
AV Mean grad: 6 mmHg
AV Peak grad: 10.8 mmHg
Ao pk vel: 1.64 m/s
Calc EF: 30.9 %
Height: 60 in
Single Plane A2C EF: 45.3 %
Single Plane A4C EF: 14.8 %
Weight: 2288 oz

## 2020-04-16 LAB — CBC WITH DIFFERENTIAL/PLATELET
Abs Immature Granulocytes: 0.06 10*3/uL (ref 0.00–0.07)
Basophils Absolute: 0 10*3/uL (ref 0.0–0.1)
Basophils Relative: 0 %
Eosinophils Absolute: 0 10*3/uL (ref 0.0–0.5)
Eosinophils Relative: 0 %
HCT: 37.7 % (ref 36.0–46.0)
Hemoglobin: 12 g/dL (ref 12.0–15.0)
Immature Granulocytes: 1 %
Lymphocytes Relative: 5 %
Lymphs Abs: 0.3 10*3/uL — ABNORMAL LOW (ref 0.7–4.0)
MCH: 32.9 pg (ref 26.0–34.0)
MCHC: 31.8 g/dL (ref 30.0–36.0)
MCV: 103.3 fL — ABNORMAL HIGH (ref 80.0–100.0)
Monocytes Absolute: 0.1 10*3/uL (ref 0.1–1.0)
Monocytes Relative: 1 %
Neutro Abs: 6.5 10*3/uL (ref 1.7–7.7)
Neutrophils Relative %: 93 %
Platelets: 153 10*3/uL (ref 150–400)
RBC: 3.65 MIL/uL — ABNORMAL LOW (ref 3.87–5.11)
RDW: 20.6 % — ABNORMAL HIGH (ref 11.5–15.5)
WBC: 7 10*3/uL (ref 4.0–10.5)
nRBC: 2.6 % — ABNORMAL HIGH (ref 0.0–0.2)

## 2020-04-16 LAB — CRYPTOCOCCAL ANTIGEN: Crypto Ag: NEGATIVE

## 2020-04-16 LAB — HEMOGLOBIN A1C
Hgb A1c MFr Bld: 8.6 % — ABNORMAL HIGH (ref 4.8–5.6)
Mean Plasma Glucose: 200.12 mg/dL

## 2020-04-16 LAB — CBG MONITORING, ED
Glucose-Capillary: 52 mg/dL — ABNORMAL LOW (ref 70–99)
Glucose-Capillary: 124 mg/dL — ABNORMAL HIGH (ref 70–99)
Glucose-Capillary: 167 mg/dL — ABNORMAL HIGH (ref 70–99)

## 2020-04-16 LAB — APTT
aPTT: 109 seconds — ABNORMAL HIGH (ref 24–36)
aPTT: >200 seconds (ref 24–36)
aPTT: 85 seconds — ABNORMAL HIGH (ref 24–36)

## 2020-04-16 LAB — PATHOLOGIST SMEAR REVIEW

## 2020-04-16 LAB — GLUCOSE, CAPILLARY
Glucose-Capillary: 381 mg/dL — ABNORMAL HIGH (ref 70–99)
Glucose-Capillary: 449 mg/dL — ABNORMAL HIGH (ref 70–99)

## 2020-04-16 LAB — PHOSPHORUS: Phosphorus: 3.8 mg/dL (ref 2.5–4.6)

## 2020-04-16 MED ORDER — FUROSEMIDE 20 MG PO TABS
20.0000 mg | ORAL_TABLET | Freq: Every day | ORAL | Status: DC
  Administered 2020-04-16: 20 mg via ORAL
  Filled 2020-04-16: qty 1

## 2020-04-16 MED ORDER — OXYCODONE HCL 5 MG PO TABS
5.0000 mg | ORAL_TABLET | ORAL | Status: DC | PRN
  Filled 2020-04-16 (×4): qty 1

## 2020-04-16 MED ORDER — POTASSIUM CHLORIDE CRYS ER 20 MEQ PO TBCR
40.0000 meq | EXTENDED_RELEASE_TABLET | Freq: Once | ORAL | Status: AC
  Administered 2020-04-16: 40 meq via ORAL
  Filled 2020-04-16: qty 2

## 2020-04-16 MED ORDER — METHOTREXATE 2.5 MG PO TABS
25.0000 mg | ORAL_TABLET | ORAL | Status: DC
  Filled 2020-04-16: qty 10

## 2020-04-16 MED ORDER — PERFLUTREN LIPID MICROSPHERE
1.0000 mL | INTRAVENOUS | Status: AC | PRN
  Administered 2020-04-16: 2 mL via INTRAVENOUS
  Filled 2020-04-16: qty 10

## 2020-04-16 MED ORDER — ACETAMINOPHEN 650 MG RE SUPP: 650.0000 mg | Freq: Four times a day (QID) | RECTAL | Status: DC

## 2020-04-16 MED ORDER — INSULIN ASPART 100 UNIT/ML ~~LOC~~ SOLN: 0.0000 [IU] | Freq: Every day | SUBCUTANEOUS | Status: DC

## 2020-04-16 MED ORDER — POTASSIUM CHLORIDE CRYS ER 20 MEQ PO TBCR: 20.0000 meq | EXTENDED_RELEASE_TABLET | Freq: Every day | ORAL | Status: DC

## 2020-04-16 MED ORDER — INSULIN ASPART 100 UNIT/ML ~~LOC~~ SOLN
0.0000 [IU] | Freq: Three times a day (TID) | SUBCUTANEOUS | Status: DC
  Administered 2020-04-16: 2 [IU] via SUBCUTANEOUS
  Administered 2020-04-16: 7 [IU] via SUBCUTANEOUS

## 2020-04-16 MED ORDER — OXYCODONE HCL 5 MG PO TABS
10.0000 mg | ORAL_TABLET | ORAL | Status: DC | PRN
  Administered 2020-04-16 – 2020-04-17 (×4): 10 mg via ORAL
  Filled 2020-04-16 (×2): qty 2

## 2020-04-16 MED ORDER — INSULIN ASPART 100 UNIT/ML ~~LOC~~ SOLN
0.0000 [IU] | Freq: Three times a day (TID) | SUBCUTANEOUS | Status: DC
  Administered 2020-04-17: 3 [IU] via SUBCUTANEOUS
  Administered 2020-04-17: 5 [IU] via SUBCUTANEOUS

## 2020-04-16 MED ORDER — ACETAMINOPHEN 325 MG PO TABS
650.0000 mg | ORAL_TABLET | Freq: Four times a day (QID) | ORAL | Status: DC
  Administered 2020-04-16 – 2020-04-17 (×5): 650 mg via ORAL
  Filled 2020-04-16 (×5): qty 2

## 2020-04-16 MED ORDER — AMIODARONE HCL 200 MG PO TABS
200.0000 mg | ORAL_TABLET | Freq: Every day | ORAL | Status: DC
  Administered 2020-04-16: 200 mg via ORAL
  Filled 2020-04-16: qty 1

## 2020-04-16 MED ORDER — INSULIN ASPART 100 UNIT/ML ~~LOC~~ SOLN
12.0000 [IU] | Freq: Once | SUBCUTANEOUS | Status: AC
  Administered 2020-04-16: 12 [IU] via SUBCUTANEOUS

## 2020-04-16 MED ORDER — NYSTATIN 100000 UNIT/ML MT SUSP
5.0000 mL | Freq: Four times a day (QID) | OROMUCOSAL | Status: DC
  Administered 2020-04-16 – 2020-04-17 (×5): 500000 [IU] via ORAL
  Filled 2020-04-16 (×8): qty 5

## 2020-04-16 NOTE — Progress Notes (Signed)
PT Cancellation Note  Patient Details Name: Lisa Poole MRN: [id number] DOB: February 06, 1966   Cancelled Treatment:    Reason Eval/Treat Not Completed: Medical issues which prohibited therapy Pt currently hypotensive (87/73). Will hold until medically appropriate and follow up as schedule allows.   Reuel Derby, PT, DPT  Acute Rehabilitation Services  Pager: [phone] Office: [phone]    Rudean Hitt 04/16/2020, 9:15 AM

## 2020-04-16 NOTE — Progress Notes (Addendum)
[place] for Heparin  Indication: DVT  Allergies  Allergen Reactions  . Oxycodone Nausea And Vomiting  . Ace Inhibitors Other (See Comments)    Flu-like symptoms  . Augmentin [Amoxicillin-Pot Clavulanate] Nausea And Vomiting  . Sulfa Antibiotics Nausea And Vomiting    Patient Measurements: Height: 5' (152.4 cm) Weight: 64.9 kg (143 lb) IBW/kg (Calculated) : 45.5 Heparin Dosing Weight: 65 kg   Vital Signs: BP: 125/59 (12/07 1432) Pulse Rate: 97 (12/07 1432)  Labs: Recent Labs    05/07/2020 1337 04/11/2020 1505 04/16/20 0412 04/16/20 1512 04/16/20 1634  HGB 12.8  --  12.0  --   --   HCT 39.0  --  37.7  --   --   PLT 215  --  153  --   --   APTT  --   --  109* >200* 85*  HEPARINUNFRC  --   --   --  >2.20* >2.20*  CREATININE 0.74  --  0.59  --   --   TROPONINIHS 2,111* 4,753*  --   --   --     Estimated Creatinine Clearance: 66.9 mL/min (by C-G formula based on SCr of 0.59 mg/dL).   Medical History: Past Medical History:  Diagnosis Date  . AICD (automatic cardioverter/defibrillator) present   . Aortic insufficiency   . Asthma 09/13/2006  . Complete heart block ([place]) 02/11/2019  . Cystocele without uterine prolapse 12/12/2018  . Diabetes mellitus without complication ([place])   . Elevated troponin level   . Hypercholesteremia   . Hypertension   . Presence of permanent cardiac pacemaker   . Primary osteoarthritis of left knee 12/10/2017    Assessment: 54 yr old female with significant cardiac history on apixaban at home for hx of  DVT. Pharmacy was consulted to transition patient to IV heparin while admitted. Patient confirms that her last dose of apixaban was around 8 AM on 12/6. Given recent apixaban exposure, will monitor anticoagulation using aPTT until aPTT and heparin levels correlate.  APTT and heparin level drawn ~9.5 hr after heparin infusion was decreased to 900 units/hr were 85 sec and >2.20 units/ml, respectively; aPTT is  within goal range for this pt; elevated heparin level indicates that apixaban is still influencing heparin level at this time.  H/H, platelets WNL, Scr WNL. Per RN, no issues with IV or bleeding observed.  Pt scheduled for ENB and bronch/BAL with Dr. Lamonte Sakai in Endo at 10 AM on 12/8.  Goal of Therapy:  Heparin level 0.3-0.7 units/ml aPTT 66-102 seconds Monitor platelets by anticoagulation protocol: Yes   Plan:  Continue heparin infusion at 900 units/hr Check follow up aPTT/heparin level in 6 hrs Heparin infusion off at 0400 on 12/8 for procedure Monitor daily aPTT, heparin level, CBC Monitor for signs/symtoms of bleeding  Gillermina Hu, PharmD, BCPS, [facility] Clinical Pharmacist

## 2020-04-16 NOTE — Progress Notes (Signed)
  Echocardiogram 2D Echocardiogram with definity has been performed.  Darlina Sicilian M 04/16/2020, 1:51 PM

## 2020-04-16 NOTE — ED Notes (Signed)
Patient CBG 52, peanut butter and orange juice given. Patient alert and oriented x4.

## 2020-04-16 NOTE — Progress Notes (Signed)
OT Cancellation Note  Patient Details Name: Lisa Poole MRN: [id number] DOB: 05-14-1965   Cancelled Treatment:    Reason Eval/Treat Not Completed: Medical issues which prohibited therapy. Pt currently hypotensive (87/73). Will hold until medically appropriate and follow up as schedule allows.  [place] 04/16/2020, 10:03 AM   Jesse Sans OTR/L Acute Rehabilitation Services Pager: [phone] Office: [phone]

## 2020-04-16 NOTE — ED Notes (Signed)
Lunch Tray Ordered @ 1036. 

## 2020-04-16 NOTE — Consult Note (Signed)
NAME:  Lisa Poole, MRN:  [id number], DOB:  02/06/1966, LOS: 0 ADMISSION DATE:  04/26/2020, CONSULTATION DATE:  04/16/2020 REFERRING MD:  FPTS, CHIEF COMPLAINT:  Pulmonary nodules   Brief History   54 year old female with history of HFrEF and presumed cardiac sarcoidosis on immunosuppressive therapies admitted to [facility] on 12/6 with one week history of chest pain and dyspnea found on workup to have new and bilateral pulmonary nodules on imaging.   History of present illness    54 year old female with history of never smoker, PAF on apixiban, VT s/p ICD, CHB s/p CRT-D, systolic heart failure/ NICM, suspected cardiac sarcoidosis- followed at [facility], RIJ DVT, DMT2, HTN, HLD, and COVID-19 (08/2019) admitted to [facility] on 12/6 with one week history of chest pain and dyspnea.    Prior to fall 2020, patient had no cardiac history.  She developed complete heart block requiring pacemaker placement 02/2019.  In March 2021, she contracted COVID-19 but did not require hospitalization.  In 7/21, she was admitted with new onset heart failure with cardiogenic shock and episodes of VT/ torsades.  Workup negative for significant coronary artery disease, however cMRI concerning for cardiac sarcoidosis vs prior myocarditis in which she was started on prednisone and referred to [facility].  She was on amiodarone switched to oral mexiletine at discharge. HRCT 7/21 showed areas of peribronchovascular ground-glass in the lower lobes but no definite evidence for pulmonary sarcoidosis.   After discharge, she developed a right groin infection from MRSA with negative blood cultures treated with linezolid with ID input.  She was admitted again 12/2019 for her ICD firing when her prednisone was tapered and since remained on prednisone and methotrexate since August 2021.  She was admitted again 9/21 with VT after prednisone taper, seen by Dr. [place] Bells at that time for concern of possible inflammatory process and concern of pulmonary sarcoidosis;   see full pulmonary consult note on 02/06/2020.   Amiodarone restarted and mexiletine stopped and prednisone increased again.  She underwent bronchoscopy with transbronchial biopsies and BAL on 9/30.  RLL biopsy was benign with no granulomata or malignancy identified. BAL, fungal, and AFB were negative.  Additionally, HRCT scan repeated on 9/30 revealed a 52m LUL nodule noted, no pulmonary parenchymal infiltrate, and calcified mediastinal nodes but no adenopathy with recommendations for repeat CT in 3 months.  Was started on bactrim for Pneumocystis prophylaxis on 10/1.   [facility] to her presumed cardiac sarcoidosis and continued on high dose prednisone, and methotrexate dose increased.  Virology testing performed then [facility] however only received one infusion.  Additionally, she was treated for thrush with fluconazole.    This admission, she presented with one week history of chest and back tightness and dyspnea, worse on inspiration.  Also reports development of non blanching, non raised, non-puritic rash that started last week.  Denies recent fever, chills, cough, URI, sick contacts, or recent travel.  She currently is sweating but reports that's not new and has been happening for years.  She was was noted to have lower blood pressure.  Home meds held and started on midodrine with improvement.  Labs noted for BNP 492, troponin hs  4863-> 4753, sed rate 73, and CXR showing patchy airspace and interstitial opacities.  CTA was negative for PE but showed new numerous bilateral pulmonary nodules (since 02/07/2020), some of which are cavitary with superimposed chronic sequelae of granulomatous disease in the  chest and upper abd.  ID and pulmonary consulted for further recommendations and testing.  Cultures sent and started on empiric vancomycin and cefepime.     Past Medical History  Never smoker, PAF on  apixiban, VT s/p ICD, CHB s/p CRT-D, systolic heart failure/ NICM, suspected cardiac sarcoidosis- followed at [facility], RIJ DVT, DMT2, HTN, HLD COVID-19 (08/2019 not requiring hospitalization)  Significant Hospital Events   12/6 Admitted to [facility]PT[facility]  Consults:  HF ID Pulmonary   Procedures:   Significant Diagnostic Tests:  12/6 CTA PE >> 1. Negative for acute pulmonary embolus. 2. However, there are innumerable new bilateral pulmonary nodule since September, the largest of which are cavitary. Some more generalized pulmonary consolidation (left lung apex). No mediastinal lymphadenopathy or pleural effusion. Top differential considerations include septic emboli, and endemic or invasive fungal disease (Aspergillus, Candida, mucormycosis, etc). Recommend blood cultures. 3. Superimposed chronic sequelae of granulomatous disease in the chest and upper abdomen. 4. Calcified coronary artery atherosclerosis. Addendum: Study discussed by telephone with a provider on the overnight medicine team on 05/01/2020 at 2100 hours. She advised that patient on methotrexate for cardiac sarcoidosis.  We discussed that methotrexate lung toxicity can have an appearance of organizing pneumonia (as might be seen in the left lung apex of this study), but drug toxicity seems less likely than a disseminated infection given both the overall pattern of disease and the patient's immunocompromised state. Blood cultures are pending at this time.  Fulminant pulmonary sarcoidosis also seems unlikely given little to no involvement of the lungs in September.  Micro Data:  12/6 SARS2/ Flu >> neg 12/6 RVP >>  12/7 BCx 2 >>  Antimicrobials:  12/6 vancomycin 12/6 cefepime   Interim history/subjective:   Objective   Blood pressure 104/67, pulse (!) 102, temperature 99.7 F (37.6 C), temperature source Oral, resp. rate 20, height 5' (1.524 m), weight 64.9 kg, last menstrual period 07/25/2015, SpO2 100 %.        Intake/Output  Summary (Last 24 hours) at 04/16/2020 1004 Last data filed at 04/16/2020 0626 Gross per 24 hour  Intake 351.25 ml  Output 1950 ml  Net -1598.75 ml   Filed Weights   04/22/2020 1442  Weight: 64.9 kg   Examination: General:  Adult female sitting upright in bed in NAD HEENT: MM pink/moist, oral thrush Neuro: AOx 4 CV: rr, no murmur, +2 pulses PULM:  Non labored, mildly tachypneic, clear anteriorly, posterior bibasilar rales GI: soft, bs+ active, NT/ND Extremities: warm/dry, +1 LE edema Skin: bruising to right forearm and petechial rash to abd and inner thighs   Resolved Hospital Problem list    Assessment & Plan:   Bilateral pulmonary nodules with cavitations concerning for septic emboli in the immunocompromised host  - CTA negative for PE - wide ddx most concerning for infectious/fungal etiology vs pulmonary sarcoidosis vs drug toxicity (on amiodarone and methotrexate, less likely given imaging) vs low suspicion for malignancy  P:  - Plans for ENB and bronch/ BAL under general anesthesia with Dr. Lamonte Sakai in endo 12/8 at 10am.  Have coordinated with HF team, while patient is under general anesthesia to proceed with TEE as well to r/o IE.  Will be NPO after midnight.  Cultures, fungal and AFB will be sent along with biopsy.  - ID following, appreciate input, agree with holding abx for now and will resume after cultures obtained tomorrow.  - Serum fungitell, blastomyces, and cryptococcal antigens ordered - Urine histoplasma and blastomyces antigen ordered - RVP pending - follow  blood cultures - continue supplemental O2 for sat goal > 92% - ongoing pulmonary hygiene/ IS  Remainder per primary team. PCCM will continue to follow.    Labs   CBC: Recent Labs  Lab 05/08/2020 1337 04/16/20 0412  WBC 6.5 7.0  NEUTROABS  --  6.5  HGB 12.8 12.0  HCT 39.0 37.7  MCV 102.4* 103.3*  PLT 215 194    Basic Metabolic Panel: Recent Labs  Lab 04/26/2020 1337 05/02/2020 1412 04/16/20 0412  NA  129*  --  134*  K 3.8  --  3.4*  CL 95*  --  99  CO2 21*  --  20*  GLUCOSE 212*  --  84  BUN 21*  --  19  CREATININE 0.74  --  0.59  CALCIUM 8.9  --  8.4*  MG  --  1.5* 2.5*  PHOS  --   --  3.8   GFR: Estimated Creatinine Clearance: 66.9 mL/min (by C-G formula based on SCr of 0.59 mg/dL). Recent Labs  Lab 04/22/2020 1337 04/16/20 0412  WBC 6.5 7.0    Liver Function Tests: Recent Labs  Lab 04/16/20 0412  AST 67*  ALT 36  ALKPHOS 45  BILITOT 1.0  PROT 5.6*  ALBUMIN 2.6*   No results for input(s): LIPASE, AMYLASE in the last 168 hours. No results for input(s): AMMONIA in the last 168 hours.  ABG    Component Value Date/Time   PHART 7.520 (H) 12/02/2019 1106   PCO2ART 51.1 (H) 12/02/2019 1106   PO2ART 482 (H) 12/02/2019 1106   HCO3 29.3 (H) 02/09/2020 1249   TCO2 31 02/09/2020 1249   O2SAT 74.0 02/09/2020 1249     Coagulation Profile: No results for input(s): INR, PROTIME in the last 168 hours.  Cardiac Enzymes: No results for input(s): CKTOTAL, CKMB, CKMBINDEX, TROPONINI in the last 168 hours.  HbA1C: Hgb A1c MFr Bld  Date/Time Value Ref Range Status  04/16/2020 04:12 AM 8.6 (H) 4.8 - 5.6 % Final    Comment:    (NOTE) Pre diabetes:          5.7%-6.4%  Diabetes:              >6.4%  Glycemic control for   <7.0% adults with diabetes   02/06/2020 10:08 AM 10.2 (H) 4.8 - 5.6 % Final    Comment:    (NOTE) Pre diabetes:          5.7%-6.4%  Diabetes:              >6.4%  Glycemic control for   <7.0% adults with diabetes     CBG: Recent Labs  Lab 04/16/20 0725 04/16/20 0947  GLUCAP 52* 124*    Review of Systems:   Review of Systems  Constitutional: Positive for malaise/fatigue. Negative for chills, fever and weight loss.  Respiratory: Positive for shortness of breath. Negative for cough, hemoptysis and sputum production.   Cardiovascular: Positive for chest pain. Negative for palpitations, orthopnea, leg swelling and PND.  Gastrointestinal:  Negative for abdominal pain, nausea and vomiting.  Musculoskeletal: Positive for myalgias.  Skin: Positive for rash. Negative for itching.  Neurological: Negative for focal weakness.   Past Medical History  She,  has a past medical history of AICD (automatic cardioverter/defibrillator) present, Aortic insufficiency, Asthma (09/13/2006), Complete heart block ([place]) (02/11/2019), Cystocele without uterine prolapse (12/12/2018), Diabetes mellitus without complication ([place]), Elevated troponin level, Hypercholesteremia, Hypertension, Presence of permanent cardiac pacemaker, and Primary osteoarthritis of left knee (12/10/2017).  Surgical History    Past Surgical History:  Procedure Laterality Date  . APPENDECTOMY    . BIOPSY  02/08/2020   Procedure: BIOPSY;  Surgeon: Juanito Doom, MD;  Location: [facility] ENDOSCOPY;  Service: Cardiopulmonary;;  . BIV ICD INSERTION CRT-D N/A 12/07/2019   Procedure: BIV ICD INSERTION CRT-D;  Surgeon: Evans Lance, MD;  Location: [place] CV LAB;  Service: Cardiovascular;  Laterality: N/A;  . BRONCHIAL WASHINGS  02/08/2020   Procedure: BRONCHIAL WASHINGS;  Surgeon: Juanito Doom, MD;  Location: [facility] ENDOSCOPY;  Service: Cardiopulmonary;;  . IABP INSERTION N/A 12/01/2019   Procedure: IABP Insertion;  Surgeon: Larey Dresser, MD;  Location: [place] CV LAB;  Service: Cardiovascular;  Laterality: N/A;  . INSERT / REPLACE / REMOVE PACEMAKER    . LEFT HEART CATH AND CORONARY ANGIOGRAPHY N/A 09/28/2018   Procedure: LEFT HEART CATH AND CORONARY ANGIOGRAPHY;  Surgeon: Troy Sine, MD;  Location: [place] CV LAB;  Service: Cardiovascular;  Laterality: N/A;  . PACEMAKER IMPLANT N/A 02/13/2019   Procedure: PACEMAKER IMPLANT;  Surgeon: Evans Lance, MD;  Location: [place] CV LAB;  Service: Cardiovascular;  Laterality: N/A;  . PACEMAKER IMPLANT    . RIGHT HEART CATH N/A 02/09/2020   Procedure: RIGHT HEART CATH;  Surgeon: Larey Dresser, MD;  Location: [place] CV LAB;  Service: Cardiovascular;  Laterality: N/A;  . RIGHT/LEFT HEART CATH AND CORONARY ANGIOGRAPHY N/A 12/01/2019   Procedure: RIGHT/LEFT HEART CATH AND CORONARY ANGIOGRAPHY;  Surgeon: Larey Dresser, MD;  Location: [place] CV LAB;  Service: Cardiovascular;  Laterality: N/A;  . ROTATOR CUFF REPAIR Bilateral 2017  . SINUS EXPLORATION    . TOTAL KNEE ARTHROPLASTY Left 12/10/2017   Procedure: LEFT TOTAL KNEE ARTHROPLASTY;  Surgeon: Sydnee Cabal, MD;  Location: [facility] ORS;  Service: Orthopedics;  Laterality: Left;  Adductor Block  . TUBAL LIGATION    . VIDEO BRONCHOSCOPY N/A 02/08/2020   Procedure: VIDEO BRONCHOSCOPY WITH FLUORO;  Surgeon: Juanito Doom, MD;  Location: [place];  Service: Cardiopulmonary;  Laterality: N/A;     Social History   reports that she has never smoked. She has never used smokeless tobacco. She reports that she does not drink alcohol and does not use drugs.   Family History   Her family history includes Alzheimer's disease in her mother; Colon cancer in her maternal grandmother.   Allergies Allergies  Allergen Reactions  . Oxycodone Nausea And Vomiting  . Ace Inhibitors Other (See Comments)    Flu-like symptoms  . Augmentin [Amoxicillin-Pot Clavulanate] Nausea And Vomiting  . Sulfa Antibiotics Nausea And Vomiting     Home Medications  Prior to Admission medications   Medication Sig Start Date End Date Taking? Authorizing Provider  acetaminophen (TYLENOL) 325 MG tablet Take 2 tablets (650 mg total) by mouth every 4 (four) hours as needed for headache or mild pain. 12/27/19  Yes Shirley Friar, PA-C  albuterol (PROVENTIL HFA;VENTOLIN HFA) 108 (90 Base) MCG/ACT inhaler Inhale 1-2 puffs into the lungs every 6 (six) hours as needed for wheezing or shortness of breath. 07/30/18  Yes Drenda Freeze, MD  amiodarone (PACERONE) 200 MG tablet Take 200 mg by mouth daily.   Yes [provider]  apixaban (ELIQUIS) 5 MG TABS tablet  Take 1 tablet (5 mg total) by mouth 2 (two) times daily. 01/26/20  Yes Lyda Jester M, PA-C  Biotin 10 MG CAPS Take 10 mg by mouth daily.   Yes [provider]  Cholecalciferol (VITAMIN D-3) 25 MCG (1000 UT) CAPS Take 1,000 Units by mouth daily.    Yes [provider]  FARXIGA 10 MG TABS tablet TAKE 1 TABLET(10 MG) BY MOUTH DAILY Patient taking differently: Take 10 mg by mouth daily.  02/22/20  Yes Larey Dresser, MD  fluconazole (DIFLUCAN) 150 MG tablet Take 150 mg by mouth at bedtime. 04/08/20  Yes [provider]  folic acid (FOLVITE) 1 MG tablet Take 1 tablet (1 mg total) by mouth daily. 12/27/19  Yes Shirley Friar, PA-C  furosemide (LASIX) 20 MG tablet Take 1 tablet (20 mg total) by mouth every other day. 04/10/20  Yes Larey Dresser, MD  gabapentin (NEURONTIN) 600 MG tablet Take 600 mg by mouth 2 (two) times daily.   Yes [provider]  glipiZIDE (GLUCOTROL) 5 MG tablet Take 1 tablet (5 mg total) by mouth daily before breakfast. 04/10/20  Yes Larey Dresser, MD  HYDROcodone-acetaminophen [facility]) 10-325 MG tablet Take 1 tablet by mouth every 6 (six) hours as needed for severe pain.  03/07/20  Yes [provider]  metFORMIN (GLUCOPHAGE) 1000 MG tablet Take 1 tablet (1,000 mg total) by mouth 2 (two) times daily with a meal. 02/27/20  Yes Larey Dresser, MD  methotrexate (RHEUMATREX) 10 MG tablet Take 25 mg by mouth once a week. Take on Wednesdays  Caution: Chemotherapy. Protect from light.   Yes [provider]  metoprolol succinate (TOPROL-XL) 25 MG 24 hr tablet Take 12.5 mg by mouth at bedtime.  04/03/20  Yes [provider]  mexiletine (MEXITIL) 150 MG capsule Take 1 capsule (150 mg total) by mouth 2 (two) times daily. 02/26/20  Yes Vickie Epley, MD  Multiple Vitamins-Minerals (CENTRUM SILVER 50+WOMEN) TABS Take 1 tablet by mouth daily.   Yes [provider]  omeprazole (PRILOSEC) 40 MG capsule  Take 40 mg by mouth daily.   Yes [provider]  pravastatin (PRAVACHOL) 10 MG tablet Take 10 mg by mouth daily at 6 PM.    Yes [provider]  predniSONE (DELTASONE) 20 MG tablet Take 3 tablets (60 mg total) by mouth daily with breakfast. 02/10/20  Yes Lyda Jester M, PA-C  spironolactone (ALDACTONE) 25 MG tablet Take 1 tablet (25 mg total) by mouth daily. Patient taking differently: Take 12.5 mg by mouth daily.  03/14/20  Yes Nipp, Carriel T, MD  sulfamethoxazole-trimethoprim (BACTRIM DS) 800-160 MG tablet Take 1 tablet by mouth 3 (three) times a week. 02/12/20  Yes Rosita Fire, Brittainy M, PA-C  venlafaxine XR (EFFEXOR-XR) 150 MG 24 hr capsule Take 150 mg by mouth daily with breakfast.   Yes [provider]  ENTRESTO 49-51 MG Take 1 tablet by mouth 2 (two) times daily. Hold Patient not taking: Reported on 04/16/2020 03/19/20   [provider]  glucose blood (COOL BLOOD GLUCOSE TEST STRIPS) test strip Use as instructed 02/09/20   Consuelo Pandy, PA-C          Kennieth Rad, ACNP [facility] 04/16/2020, 2:43 PM

## 2020-04-16 NOTE — Hospital Course (Addendum)
Lisa Poole is a 54 y.o. female who presented with chest pain and dyspnea x 1 week and was admitted for further evaluation and management of multiple pulmonary nodules concerning for septic emboli. Past medical history is s/f multiple cardiac pathologies: suspected cardiac sarcoidosis, HFrEF (27% EF), ICD, ventriclar tacchyarrhytmia, as well as diabetes, asthma, HTN. Below is her hospital course.   Dyspnea  ?Septic emboli vs endemic or invasive fungal disease   Pleuritic Chest Pain    Cardiac Sarcoidosis  HFrEF Echo with ***   Other chronic problems stable.    Follow- Up On methotrexate, will need to follow CBC's, LFT's monthly

## 2020-04-16 NOTE — Consult Note (Signed)
[name]    Date of Admission:  04/21/2020     Total days of antibiotics 1  Vancomycin Cefepime                Reason for Consult: B/L Pulmonary Nodules - ?OI  Referring Provider: Hensel  Primary Care Provider: Milford Cage, PA    Assessment: Lisa Poole is a 54 y.o. female on immunosuppresive therapy with infliximab (last dose ~September), methotrexate and prednisone for cardiac sarcoidosis here with 1 week history of progressively worsening shortness of breath, chest pain, oral thrush, body aches and petechial rash.  CT of the chest demonstrated bilateral nodular lesions that are new since 02/07/2020. CT of the head demonstrates low level left maxillary sinus mucosal thickening.   Infectious etiology possible with fungal, mycobacterial, nocardia (although less likely given on prophylaxis), bacterial endocarditis vs drug injury with methotrexate toxicity, amiodarone toxicity vs pulmonary sarcoid (less likely hopefully given she has been on therapy).   She needs a bronchoscopy to sample to help define process.  TTE ordered - would arrange TEE for more sensitive test to r/o right sided endocarditis. Blood cultures have been drawn.  Would hold off on systemic antimicrobials and antifungals until we can arrange bronchoscopy to increase yield for diagnostic information. Will obtain serologies for Fungitell assay, cryptococcus and urine for blasto and histo.   Can do nystatin swish and swallow for oral thrush for now - can do fluconazole 200 mg PO for at least a week pending response. Most likely candida albicans.     Plan: 1. Serum fungitell, blastomyces and cryptoccal antigens 2. Urine Histoplasma & balstomyces antigen  3. Recommend TEE 4. Recommend bronchoscopy  5. Hold further antimicrobials for now 6. Nystatin swish and swallow for now  7. Follow blood cultures.  8. Will need routine, fungal and AFB culture from BAL   Active  Problems:   Septic embolism (HCC)   Multiple pulmonary nodules   Chest pain   Immunosuppressed status ([place])   . acetaminophen  650 mg Oral Q6H   Or  . acetaminophen  650 mg Rectal Q6H  . amiodarone  200 mg Oral Daily  . cholecalciferol  1,000 Units Oral Daily  . folic acid  1 mg Oral Daily  . furosemide  20 mg Oral Daily  . gabapentin  600 mg Oral BID  . insulin aspart  0-9 Units Subcutaneous TID WC  . [START ON Apr 25, 2020] methotrexate  25 mg Oral Q Wed  . mexiletine  150 mg Oral Q12H  . midodrine  5 mg Oral TID WC  . multivitamin with minerals  1 tablet Oral Daily  . nystatin  5 mL Oral QID  . pantoprazole  80 mg Oral Daily  . polyethylene glycol  17 g Oral Daily  . potassium chloride  20 mEq Oral Daily  . pravastatin  10 mg Oral q1800  . predniSONE  60 mg Oral Q breakfast  . [START ON 04-25-20] sulfamethoxazole-trimethoprim  1 tablet Oral Once per day on Mon Wed Fri  . venlafaxine XR  150 mg Oral Q breakfast    HPI: Lisa Poole is a 54 y.o. female admitted from home for evaluation and treatment of progressive shortness of breath x 1 week.   She has a PMHx significant for VT s/p ICD, NICM, CHF, afib, T2DM and recently diagnosed cardiac sarcoidosis in care with [facility] (previously on prednisone >> infliximab infusions with last dose  in August/September + methotrexate currently).   She says that over the last week she has noticed worsening sharp stabbing shortness of breath and chest pain. Not necessarily a cough, that is infrequent. SOB has limited her ADLs at home substantially. She also describes a waxing and waning flat, non-pruritic red rash to the upper thighs, abdomen, chest and inner arms along with whole body pain and refractory oral thrush (was treated with fluconazole 150 mg QD x 3 doses, 3rd round of treatment now).   She has not noticed any fevers or chills in the weeks leading up to her current illness. CT scan of the lungs revealed multiple bilateral nodular pattern  suggesting atypical infectious process.   Of note she has been compliant with her bactrim prophylaxis 3x weekly since October 4th when high dose steroids were added.   She has a puppy at home, has caused some scratches on her skin otherwise with jumping on her. No cats, birds or other animal exposures. Quantiferon in records prior to infliximab was non-reactive. HIV non-reactive.    Review of Systems: Review of Systems  Constitutional: Positive for malaise/fatigue. Negative for chills and fever.  HENT: Negative for congestion and sore throat.   Eyes: Negative for blurred vision and photophobia.  Respiratory: Positive for shortness of breath.   Cardiovascular: Positive for chest pain and palpitations. Negative for leg swelling.  Gastrointestinal: Negative for abdominal pain, diarrhea and vomiting.  Genitourinary: Negative for dysuria.  Musculoskeletal: Positive for joint pain and myalgias.  Skin: Positive for rash. Negative for itching.  Neurological: Positive for weakness. Negative for dizziness, focal weakness and headaches.    Past Medical History:  Diagnosis Date  . AICD (automatic cardioverter/defibrillator) present   . Aortic insufficiency   . Asthma 09/13/2006  . Complete heart block ([place]) 02/11/2019  . Cystocele without uterine prolapse 12/12/2018  . Diabetes mellitus without complication ([place])   . Elevated troponin level   . Hypercholesteremia   . Hypertension   . Presence of permanent cardiac pacemaker   . Primary osteoarthritis of left knee 12/10/2017    Social History   Tobacco Use  . Smoking status: Never Smoker  . Smokeless tobacco: Never Used  Vaping Use  . Vaping Use: Never used  Substance Use Topics  . Alcohol use: No  . Drug use: No    Family History  Problem Relation Age of Onset  . Colon cancer Maternal Grandmother   . Alzheimer's disease Mother    Allergies  Allergen Reactions  . Oxycodone Nausea And Vomiting  . Ace Inhibitors Other (See Comments)     Flu-like symptoms  . Augmentin [Amoxicillin-Pot Clavulanate] Nausea And Vomiting  . Sulfa Antibiotics Nausea And Vomiting    OBJECTIVE: Blood pressure 99/67, pulse (!) 107, temperature 99.7 F (37.6 C), temperature source Oral, resp. rate 19, height 5' (1.524 m), weight 64.9 kg, last menstrual period 07/25/2015, SpO2 94 %.  Physical Exam Vitals reviewed.  Constitutional:      Comments: Upright on stretcher with nasal cannula in place. Appears uncomfortable at rest, exacerbated by any movement.   HENT:     Mouth/Throat:     Comments: White plaques coated over her tongue with erythematous base.  Neurological:     Mental Status: She is alert.     Lab Results Lab Results  Component Value Date   WBC 7.0 04/16/2020   HGB 12.0 04/16/2020   HCT 37.7 04/16/2020   MCV 103.3 (H) 04/16/2020   PLT 153 04/16/2020  Lab Results  Component Value Date   CREATININE 0.59 04/16/2020   BUN 19 04/16/2020   NA 134 (L) 04/16/2020   K 3.4 (L) 04/16/2020   CL 99 04/16/2020   CO2 20 (L) 04/16/2020    Lab Results  Component Value Date   ALT 36 04/16/2020   AST 67 (H) 04/16/2020   ALKPHOS 45 04/16/2020   BILITOT 1.0 04/16/2020     Microbiology: Recent Results (from the past 240 hour(s))  Resp Panel by RT-PCR (Flu A&B, Covid) Nasopharyngeal Swab     Status: None   Collection Time: 04/28/2020  2:56 PM   Specimen: Nasopharyngeal Swab; Nasopharyngeal(NP) swabs in vial transport medium  Result Value Ref Range Status   SARS Coronavirus 2 by RT PCR NEGATIVE NEGATIVE Final    Comment: (NOTE) SARS-CoV-2 target nucleic acids are NOT DETECTED.  The SARS-CoV-2 RNA is generally detectable in upper respiratory specimens during the acute phase of infection. The lowest concentration of SARS-CoV-2 viral copies this assay can detect is 138 copies/mL. A negative result does not preclude SARS-Cov-2 infection and should not be used as the sole basis for treatment or other patient management decisions.  A negative result may occur with  improper specimen collection/handling, submission of specimen other than nasopharyngeal swab, presence of viral mutation(s) within the areas targeted by this assay, and inadequate number of viral copies(<138 copies/mL). A negative result must be combined with clinical observations, patient history, and epidemiological information. The expected result is Negative.  Fact Sheet for Patients:  EntrepreneurPulse.com.au  Fact Sheet for Healthcare Providers:  IncredibleEmployment.be  This test is no t yet approved or cleared by the [place] FDA and  has been authorized for detection and/or diagnosis of SARS-CoV-2 by FDA under an Emergency Use Authorization (EUA). This EUA will remain  in effect (meaning this test can be used) for the duration of the COVID-19 declaration under Section 564(b)(1) of the Act, 21 U.S.C.section 360bbb-3(b)(1), unless the authorization is terminated  or revoked sooner.       Influenza A by PCR NEGATIVE NEGATIVE Final   Influenza B by PCR NEGATIVE NEGATIVE Final    Comment: (NOTE) The Xpert Xpress SARS-CoV-2/FLU/RSV plus assay is intended as an aid in the diagnosis of influenza from Nasopharyngeal swab specimens and should not be used as a sole basis for treatment. Nasal washings and aspirates are unacceptable for Xpert Xpress SARS-CoV-2/FLU/RSV testing.  Fact Sheet for Patients: EntrepreneurPulse.com.au  Fact Sheet for Healthcare Providers: IncredibleEmployment.be  This test is not yet approved or cleared by the [place] FDA and has been authorized for detection and/or diagnosis of SARS-CoV-2 by FDA under an Emergency Use Authorization (EUA). This EUA will remain in effect (meaning this test can be used) for the duration of the COVID-19 declaration under Section 564(b)(1) of the Act, 21 U.S.C. section 360bbb-3(b)(1), unless the authorization  is terminated or revoked.  Performed at [facility], [place] [address], [place], [place] [address]   Culture, blood (routine x 2)     Status: None (Preliminary result)   Collection Time: 04/12/2020  6:20 PM   Specimen: BLOOD LEFT ARM  Result Value Ref Range Status   Specimen Description BLOOD LEFT ARM  Final   Special Requests   Final    BOTTLES DRAWN AEROBIC AND ANAEROBIC Blood Culture adequate volume   Culture   Final    NO GROWTH < 24 HOURS Performed at [facility], [place] [address], [place], [place] [address]  Report Status PENDING  Incomplete     Janene Madeira, MSN, NP-C [facility] [email or web address] Pager: [phone] Office: [phone] [place]: [phone]

## 2020-04-16 NOTE — Progress Notes (Addendum)
Advanced Heart Failure Rounding Note  PCP-Cardiologist: No primary care provider on file.   Subjective:    Complaining of ongoing pain with inspiration.  04/19/2020- CTA with septic emboli. ID consulted   Bld Cx pending CTA concerning for septic emboli.  Objective:   Weight Range: 64.9 kg Body mass index is 27.93 kg/m.   Vital Signs:   Temp:  [99.7 F (37.6 C)] 99.7 F (37.6 C) (12/06 1329) Pulse Rate:  [62-130] 100 (12/07 0745) Resp:  [12-29] 15 (12/07 0745) BP: (73-166)/(50-119) 96/64 (12/07 0745) SpO2:  [89 %-98 %] 98 % (12/07 0745) Weight:  [64.9 kg] 64.9 kg (12/06 1442)    Weight change: Filed Weights   05/07/2020 1442  Weight: 64.9 kg    Intake/Output:   Intake/Output Summary (Last 24 hours) at 04/16/2020 0819 Last data filed at 04/16/2020 0717 Gross per 24 hour  Intake 351.25 ml  Output 1950 ml  Net -1598.75 ml      Physical Exam    General: Appear weak. No resp difficulty HEENT: Normal Neck: Supple. JVP 6-7 . Carotids 2+ bilat; no bruits. No lymphadenopathy or thyromegaly appreciated. Cor: PMI nondisplaced. Irregular rate & rhythm. No rubs, gallops or murmurs. Lungs: Clear onm rrom air. Abdomen: Soft, nontender, nondistended. No hepatosplenomegaly. No bruits or masses. Good bowel sounds. Extremities: No cyanosis, clubbing, rash, edema Neuro: Alert & orientedx3, cranial nerves grossly intact. moves all 4 extremities w/o difficulty. Affect pleasant   Telemetry    SR 80-90s Frequent NSVT/PVC   EKG    N/A  Labs    CBC Recent Labs    04/14/2020 1337 04/16/20 0412  WBC 6.5 7.0  NEUTROABS  --  6.5  HGB 12.8 12.0  HCT 39.0 37.7  MCV 102.4* 103.3*  PLT 215 240   Basic Metabolic Panel Recent Labs    05/03/2020 1337 05/07/2020 1412 04/16/20 0412  NA 129*  --  134*  K 3.8  --  3.4*  CL 95*  --  99  CO2 21*  --  20*  GLUCOSE 212*  --  84  BUN 21*  --  19  CREATININE 0.74  --  0.59  CALCIUM 8.9  --  8.4*  MG  --  1.5* 2.5*  PHOS  --    --  3.8   Liver Function Tests Recent Labs    04/16/20 0412  AST 67*  ALT 36  ALKPHOS 45  BILITOT 1.0  PROT 5.6*  ALBUMIN 2.6*   No results for input(s): LIPASE, AMYLASE in the last 72 hours. Cardiac Enzymes No results for input(s): CKTOTAL, CKMB, CKMBINDEX, TROPONINI in the last 72 hours.  BNP: BNP (last 3 results) Recent Labs    11/26/19 2011 02/06/20 0725 04/26/2020 1350  BNP 1,258.1* 344.3* 492.8*    ProBNP (last 3 results) No results for input(s): PROBNP in the last 8760 hours.   D-Dimer No results for input(s): DDIMER in the last 72 hours. Hemoglobin A1C Recent Labs    04/16/20 0412  HGBA1C 8.6*   Fasting Lipid Panel No results for input(s): CHOL, HDL, LDLCALC, TRIG, CHOLHDL, LDLDIRECT in the last 72 hours. Thyroid Function Tests No results for input(s): TSH, T4TOTAL, T3FREE, THYROIDAB in the last 72 hours.  Invalid input(s): FREET3  Other results:   Imaging    DG Chest 2 View  Result Date: 05/06/2020 CLINICAL DATA:  Shortness of breath. EXAM: CHEST - 2 VIEW COMPARISON:  Chest x-ray dated March 11, 2020. FINDINGS: Unchanged left chest wall pacemaker. The  heart size and mediastinal contours are within normal limits. Mild peripheral patchy airspace and interstitial opacities in the right mid and left lower lungs. No pleural effusion or pneumothorax. No acute osseous abnormality. IMPRESSION: 1. Mild peripheral patchy airspace and interstitial opacities in both lungs could reflect pulmonary edema or atypical infection, including viral pneumonia. Electronically Signed   By: Titus Dubin M.D.   On: 05/01/2020 14:12   CT Angio Chest PE W and/or Wo Contrast  Addendum Date: 04/25/2020   ADDENDUM REPORT: 05/03/2020 21:09 ADDENDUM: Study discussed by telephone with a provider on the [facility] medicine team on 04/25/2020 at 2100 hours. She advised that patient on methotrexate for cardiac sarcoidosis. We discussed that methotrexate lung toxicity can have an  appearance of organizing pneumonia (as might be seen in the left lung apex of this study), but drug toxicity seems less likely than a disseminated infection given both the overall pattern of disease and the patient's immunocompromised state. Blood cultures are pending at this time. Fulminant pulmonary sarcoidosis also seems unlikely given little to no involvement of the lungs in September. Electronically Signed   By: Genevie Ann M.D.   On: 04/12/2020 21:09   Result Date: 04/25/2020 CLINICAL DATA:  54 year old female with shortness of breath, chest pain. History of sarcoidosis, although no significant lung involvement on prior CT. EXAM: CT ANGIOGRAPHY CHEST WITH CONTRAST TECHNIQUE: Multidetector CT imaging of the chest was performed using the standard protocol during bolus administration of intravenous contrast. Multiplanar CT image reconstructions and MIPs were obtained to evaluate the vascular anatomy. CONTRAST:  43mL OMNIPAQUE IOHEXOL 350 MG/ML SOLN COMPARISON:  Chest radiographs earlier today. Chest CT without contrast 02/07/2020. FINDINGS: Cardiovascular: Excellent contrast bolus timing in the pulmonary arterial tree. No focal filling defect identified in the pulmonary arteries to suggest acute pulmonary embolism. Calcified coronary artery atherosclerosis identified on series 6, image 125. Chronic cardiac pacemaker type device from left subclavian approach again noted. Stable cardiac size since September. No pericardial effusion. Little contrast in the aorta today. Mediastinum/Nodes: Chronic postinflammatory calcified left hilar and AP window lymph nodes are stable. No mediastinal or hilar lymphadenopathy. Lungs/Pleura: New since September widespread abnormal pulmonary opacity scattered in both lungs. Nonenhancing roughly 3 cm area of consolidation suspected in the left lung apex. Innumerable new pulmonary nodules. Diffuse indistinct pulmonary nodules ranging from 2-3 mm to 2.5 cm diameter, and some of the largest  nodules are cavitary (left lower lobe series 7, image 51 and anterior right upper lobe image 32. Major airways remain patent. No associated pleural effusion. Upper Abdomen: Scattered calcified granulomas in the visible liver and spleen are stable. Small postinflammatory calcified lymph nodes also redemonstrated at the porta hepatis. Otherwise negative visible largely noncontrast upper abdomen including partially visible pancreas, adrenal glands, renal upper poles and bowel. Musculoskeletal: No acute osseous abnormality identified. Review of the MIP images confirms the above findings. IMPRESSION: 1. Negative for acute pulmonary embolus. 2. However, there are innumerable new bilateral pulmonary nodule since September, the largest of which are cavitary. Some more generalized pulmonary consolidation (left lung apex). No mediastinal lymphadenopathy or pleural effusion. Top differential considerations include septic emboli, and endemic or invasive fungal disease (Aspergillus, Candida, mucormycosis, etc). Recommend blood cultures. 3. Superimposed chronic sequelae of granulomatous disease in the chest and upper abdomen. 4. Calcified coronary artery atherosclerosis. Electronically Signed: By: Genevie Ann M.D. On: 04/23/2020 17:46      Medications:     Scheduled Medications: . amiodarone  200 mg Oral BID  . cholecalciferol  1,000 Units Oral Daily  . folic acid  1 mg Oral Daily  . gabapentin  600 mg Oral BID  . insulin aspart  0-9 Units Subcutaneous TID WC  . [START ON 04-29-20] methotrexate  25 mg Oral Q Wed  . mexiletine  150 mg Oral Q12H  . midodrine  5 mg Oral TID WC  . multivitamin with minerals  1 tablet Oral Daily  . pantoprazole  80 mg Oral Daily  . polyethylene glycol  17 g Oral Daily  . pravastatin  10 mg Oral q1800  . predniSONE  60 mg Oral Q breakfast  . [START ON 04-29-20] sulfamethoxazole-trimethoprim  1 tablet Oral Once per day on Mon Wed Fri  . venlafaxine XR  150 mg Oral Q breakfast      Infusions: . ceFEPime (MAXIPIME) IV Stopped (04/16/20 0612)  . heparin 900 Units/hr (04/16/20 0716)  . sodium chloride Stopped (05/09/2020 1455)  . vancomycin       PRN Medications:  acetaminophen **OR** acetaminophen, albuterol, oxyCODONE    Assessment/Plan   1. Dyspnea--> Septic Emboi  -HS Trop > 4000 -CTA-->Multiple  Nodules ? Septic emboli? Methotrexate lung toxicity.  May need biopsy.  - Concern for device involvement. Will need TEE to further assess. - ID consulted  - Blood Cx obtained - On Vanc and Cefepime  2. Chest Pain  -Elevated HS Trop >4000  -Sed Rate 73.  3. Hypotension  -Check blood cultures x2. -  May need to add Neo if BP remains low.  -WBC 6.5   4. A/C Systolic Heart Failure 01/239 LHC with no significant coronaries.  -Cardiac PET in 8/21 was showed active inflammation in the heart, concerning again for cardiac sarcoidosis Repeat PET in 11/21 showed ongoing inflammation but improved.Most recent echo in11/21 with EF35%, but cMRI in 11/21 showed LV EF 27%. - Volume status status improved.  Continue lasix 20 mg po daily.  Supp K.  -BP better. Continue midodrine 5 mg three times a day.  -Hold HF meds with hypotension.   4. Cardiac Sarcoid -Cardiac Pet and CMRI  Both suggestive of cardiac sarcoid. Sent to [facility] 03/13/20 for possible biopsy and transplant.  Cardiac MRI and cardiac PET both suggestive of cardiac sarcoidosis, and unexplained CHB also concerning, as is VT. She has had VT with prednisone weaning. Suspect isolated cardiac sarcoidosis. Transbronchial biopsy did not show granulomatous disease. Seen at [facility], they agree with diagnosis. Repeat cardiac PET in 11/21 showed cardiac inflammation still present but improved. -Continue prednisone 60 mg daily - Continue methotrexate once a week, she is on goal dose 25 mg weekly. Will need to follow CBC, LFTsmonthly.  - She is on Bactrim for PJP prophylaxis and folate.Follow K and  creatinine closely.  - She is now on infliximab per rheumatology at [facility]. She has had 1 infusion . 5. H/O VT  Has had multiple morphologies  Interrogate ICD . ATP several different times on 11/23/21and 04/05/20  7000 episodes NSVT on 04/10/2020  Keep K > 4 andd Mag >2  - Mag 1.5 on admit. Given 4 grams Mag. Mag up to 2.  -Continue amio 200 mg twice a day  + mexiletine 150 mg twice a day   -EP aware of admit   6. H/O CHB Has CRT  7. Thrush Need to continue diflucan.   8. H/O RIJ DVT On eliquis 5 mg twice a day . Will stop and start heparin drip in the event she may need procedures. Consult pharmacy.   9  DMII  Length of Stay: 0  Darrick Grinder, NP  04/16/2020, 8:19 AM  Advanced Heart Failure Team Pager [phone] (M-F; 7a - 4p)  Please contact [facility] for night-coverage after hours (4p -7a ) and weekends on [email or web address]  Patient seen with NP, agree with the above note.   Still with pleuritic chest pain.  Also has headache.  Profound fatigue but not short of breath.  BP better on midodrine.   General: NAD, uncomfortable with inspiration.  Neck: No JVD, no thyromegaly or thyroid nodule.  Lungs: Mild crackles at bases.  CV: Nondisplaced PMI.  Heart regular S1/S2, no S3/S4, no murmur.  No peripheral edema.   Abdomen: Soft, nontender, no hepatosplenomegaly, no distention.  Skin: Intact without lesions or rashes.  Neurologic: Alert and oriented x 3.  Psych: Normal affect. Extremities: No clubbing or cyanosis.  HEENT: Normal.   1. Pulmonary: Pleuritic chest pain with bilateral innumerable new nodules with cavitation, more generalized consolidation left lung apex.  Concern for septic emboli versus fungal disease, probably less likely methotrexate lung toxicity (has been immunosuppressed).  Blood cultures pending.  ESR elevated at 73.  - Continue cefepime/vancomycin, ?add antifungal coverage => per ID.  - Will need ID and pulmonary consults, suspect she needs bronchoscopy.  -  Will need echo to assess for right-sided endocarditis.  If vegetation not seen, will need TEE to fully rule out.  She has a device in place.  2. Headache: Needs head CT to rule out septic emboli to brain.  3. Cardiac sarcoidosis: She is followed in the sarcoidosis clinic at [facility].  Cardiac MRI and cardiac PET both suggestive of cardiac sarcoidosis, and unexplained CHB also concerning, as is VT.  She has had VT with prednisone weaning.  Suspect isolated cardiac sarcoidosis.  Transbronchial biopsy did not show granulomatous disease.  Seen at [facility], they agree with diagnosis. Repeat cardiac PET in 11/21 showed cardiac inflammation still present but improved.  I do not think that CT findings this admit would be likely to be disseminated sarcoidosis given no evident lung activity prior and consistent immunosuppression.  -Continue prednisone 60 mg daily home dose for now.  - Continue methotrexate once a week, she is on goal dose 25 mg weekly => If pulmonary thinks she has MTX lung toxicity will need to stop.  - She is on Bactrim for PJP prophylaxis and folate.  - She is now on infliximab per rheumatology at [facility], has had 1 infusion. 4. Recurrent VT: Recurrent ventricular tachycardia:She has hadVT with multiple morphologies. No coronary disease on recent cath. Suspect as abovethat she has cardiac sarcoidosis (would also explain CHB). Amiodarone stopped in the past due to torsades and very long QTc on amiodarone.  She has tolerated restart of amiodarone.  She is also on prednisone as above and has had increased VT when prednisone was tapered.  This admission, device interrogation has shown NSVT but not sustained VT.  -She will continue amiodarone 300 mg daily. -Off Coreg with soft BP.  - Continue mexiletine 150 mg bid, can increase if needed.   5. H/o RWE:RXVQMGQ due to cardiac sarcoidosis. Now s/p CRT.  6. Rt IJ DVT: likely from prior central line. Planned for 3 months Eliquis, had been planned to  stop at the end of this month.  - Currently on heparin gtt.  7. Chronic systolic CHF: Chronicsystolic CHF: Nonischemic cardiomyopathy. Echoin 7/21with EF newly decreased to 20-25% with septal-lateral dyssynchrony and septal severe hypokinesis, normal RV, moderate TR, moderate MR. Echo in  5/20 prior to PPM placement showed EF 60-65%. She developed shock in 7/21 requiring pressors/inotropes and IABP.LHC/RHC 7/23/21with no significant CAD, elevated filling pressures, and low cardiac output. cMRI 12/05/19 and repeated 11/21showed patchy LGE in the basal septum, basal-mid inferoseptum/inferior wall, basal anterior wall, mid anterolateral wall. Cardiac MRI LGE pattern could be consistent with cardiac sarcoidosis (would explain earlier CHB). Alternatively, prior viral myocarditis (recent COVID-19). CT chest with ground glass lower lobes but no evidence for pulmonary sarcoidosis, transbronchial biopsy did not show granulomatous disease. She is now s/p device upgrade to MDT CRT-D.Cardiac PET in 8/21 was showed active inflammation in the heart, concerning again for cardiac sarcoidosis. Repeat PET in 11/21 showed ongoing inflammation but improved. Most recent echo in 11/21 with EF 35%, but cMRI in 11/21 showed LV EF 27%. NYHA class IIIb with prominent fatigue.  She does not look volume overloaded on exam today.  - She is off spironolactone, Entresto, and Coreg with soft BP.  She is now on midodrine 5 mg tid.  -Continue Lasix 20 mg daily.   - ?low output with prominent fatigue.  She may need eventual RHC.  - As above, will need echo and likely TEE for evaluation of for endocarditis/device infection.  8. Chest pain: With elevated HS-TnI in the 4000s range without trend.  Pain is pleuritic and likely due to lung pathology.  No PE on CTA.  She had normal coronaries in 7/21, doubt ACS.  Seems most likely demand ischemia.   Will ask palliative care to see for goals of care.  Disseminated infection would be a  very significant set back for her.   Loralie Champagne 04/16/2020 10:54 AM

## 2020-04-16 NOTE — Progress Notes (Signed)
[place] for Heparin  Indication: DVT  Allergies  Allergen Reactions  . Oxycodone Nausea And Vomiting  . Ace Inhibitors Other (See Comments)    Flu-like symptoms  . Augmentin [Amoxicillin-Pot Clavulanate] Nausea And Vomiting  . Sulfa Antibiotics Nausea And Vomiting    Patient Measurements: Height: 5' (152.4 cm) Weight: 64.9 kg (143 lb) IBW/kg (Calculated) : 45.5 Heparin Dosing Weight: 65 kg   Vital Signs: BP: 100/75 (12/07 0600) Pulse Rate: 92 (12/07 0600)  Labs: Recent Labs    05/06/2020 1337 04/13/2020 1505 04/16/20 0412  HGB 12.8  --  12.0  HCT 39.0  --  37.7  PLT 215  --  153  APTT  --   --  109*  CREATININE 0.74  --  0.59  TROPONINIHS 4,863* 4,753*  --     Estimated Creatinine Clearance: 66.9 mL/min (by C-G formula based on SCr of 0.59 mg/dL).   Medical History: Past Medical History:  Diagnosis Date  . AICD (automatic cardioverter/defibrillator) present   . Aortic insufficiency   . Asthma 09/13/2006  . Complete heart block ([place]) 02/11/2019  . Cystocele without uterine prolapse 12/12/2018  . Diabetes mellitus without complication ([place])   . Elevated troponin level   . Hypercholesteremia   . Hypertension   . Presence of permanent cardiac pacemaker   . Primary osteoarthritis of left knee 12/10/2017    Medications:  (Not in a hospital admission)   Assessment: 28 YOF with significant cardiac history on Eliquis at home for h/o DVT. Pharmacy consulted to transition patient to IV heparin while admitted. Patient confirms that her last dose of Eliquis was around 8 AM this morning.   H/H and Plt wnl. SCr wnl   Goal of Therapy:  Heparin level 0.3-0.7 units/ml aPTT 66-102 seconds Monitor platelets by anticoagulation protocol: Yes   Plan:  -Dec heparin to 900 units/hr -F/u 6-8 hr aPTT/HL -Monitor daily HL, aPTT, CBC and s/s of bleeding  Narda Bonds, PharmD, BCPS Clinical Pharmacist Phone: [phone]

## 2020-04-16 NOTE — Progress Notes (Signed)
Family Medicine Teaching Service Daily Progress Note Intern Pager: [phone]  Patient name: Lisa Poole Medical record number: [id number] Date of birth: May 18, 1965 Age: 54 y.o. Gender: female  Primary Care Provider: Milford Cage, PA Consultants: Cardiology, ID Code Status: FULL  Pt Overview and Major Events to Date:  Admitted: 12/6  Assessment and Plan: Lisa Poole is a 54 y.o. female who presented with chest pain and dyspnea x 1 week and was admitted for further evaluation and management of multiple pulmonary nodules concerning for septic emboli. Past medical history is s/f multiple cardiac pathologies: suspected cardiac sarcoidosis, HFrEF (27% EF), ICD, ventriclar tacchyarrhytmia, as well as diabetes, asthma, HTN.   Dyspnea  ?Septic emboli vs endemic or invasive fungal disease Patient continues to have dyspnea and chest pain with breathing. Appreciate both ID and pulmonary consults and recommendations for this complex patient. She would likely benefit from bronchoscopy vs VATS for more definitive diagnosis of her pulmonary nodules.  -Appreciate ID recommendations: Serum fungitell, blastomyces and cryptoccal antigens, Urine Histoplasma & balstomyces antigen. Antibiotics have been discontinued.  - Appreciate Pulmonology recommendations, patient will likely need bronchoscopy or VATS for definitive diagnosis  - Appreciate cardiology recommendations -Follow up Echo, may need TEE if no vegetations seen on echo -Follow-up sputum cultures for Candida sensitivities -Follow-up blood cultures -Continue empiric antifungal therapy -Continue heparin GTT - Increase Oxy 5mg  for moderate pain, 10 mg for severe pain PRN - Scheduled Tylenol q6h - Incentive spirometer - Continue Bactrim for PJP prophylaxis   Headache: stable Patient complained of headache this AM, worse on right-temple. Rates it 3/10. Denies nausea, vomiting, vision changes or light sensitives. Due to complex history and  concern for septic emboli spread to brain a CT head was ordered which was negative.  Pleuritic Chest Pain Sedimentation rate elevated to 73.  -Pain control with IV morphine 1 mg every 4 as needed -Incentive spirometry every 2 hours -Follow-up echo -Consider serial troponins if worsening  HFrEF: chronic, stable Echo in 03/2020 with EF 35% and with cardiac MRI at same time showing EF 27%. S/p IV 40 mg Lasix x1. Home meds include: spironolactone 12.5 mg, lasix 20 mg EOD, Toprol XL 12.5 mg, farxiga. BP ranging 82-115/50-90 overnight. -holding home medications -Follow-up echo - Continue Midodrine 5mg  TID  -Heart failure following, appreciate recommendations  Cardiac sarcoidosis On medication significant for prednisone, Bactrim, methotrexate.  Follows with rheumatology has received infusion in the past. -Continue prednisone 60 mg daily, methotrexate 25 mg every Wednesday -Continue folic acid and vitamin D supplementation -Protonix for GI prophylaxis  History of ventricular tachycardia status post MTD CRT-D 02/2019 Device interrogation showed 7000 episodes of NSVT on day of admission.  Home medications amiodarone, mexiletine twice daily. - Continue on amiodarone and mexiletine 150 mg BID -Heart failure team following, appreciate recommendations -Keep potassium > 4, magnesium > 2  Paroxysmal atrial fibrillation On apixaban. -Continue heparin GTT  Type 2 diabetes mellitus: Chronic, stable CBG this AM 52, repeat 124 after snack.  Hgb A1c 8.6. Home medications include Arna Snipe, glipizide. -Continue sensitive sliding scale insulin 3 times daily before meals and nightly - Monitor CBG's closely - Consider stress dose steroids  Oral thrush secondary to chronic prednisone use Takes fluconazone in bursts as needed at home.  - Discussed with ID, recommends Nystatin - Hold Fluconazole due to potential for QT prolongation   Arthritis: Chronic, stable Home medications include hydrocodone  acetaminophen 10-325 mg. -Acetaminophen as needed mild pain -Oxycodone 5 mg every 4 hours as  needed for severe pain -PT/OT  FEN/GI: Regular diet, Protonix 80 mg PPx: Heparin GTT  Status is: Observation  The patient will require care spanning > 2 midnights and should be moved to inpatient because: Ongoing active pain requiring inpatient pain management and Ongoing diagnostic testing needed not appropriate for outpatient work up  Dispo: The patient is from: Home              Anticipated d/c is to: Home              Anticipated d/c date is: > 3 days              Patient currently is not medically stable to d/c.   Subjective:  Patient continues to have chest pain that she describes as feeling like a band across her chest that radiates to her back.  She has difficulty breathing particularly when taking deep breaths.  She has difficulty with movements as well secondary to the pain.  States that she has been hospitalized several times since May.  She feels generally more weak and is unable to move much.  Objective: Temp:  [99.7 F (37.6 C)] 99.7 F (37.6 C) (12/06 1329) Pulse Rate:  [62-130] 88 (12/07 0515) Resp:  [12-29] 18 (12/07 0515) BP: (73-166)/(50-119) 97/74 (12/07 0515) SpO2:  [89 %-98 %] 97 % (12/07 0515) Weight:  [64.9 kg] 64.9 kg (12/06 1442) Physical Exam: General: Mild distress,  Cardiovascular: Irregularly irregular rate Respiratory: on 3L [place], difficulty with taking deep breaths, able to speak and without accessory muscle use Abdomen:  Extremities: cold extremities with weak radial pulses but with 2+ DP pulses b/l, no edema Derm: skin with what appears to be purpura on arms and legs  Laboratory: Recent Labs  Lab 04/13/2020 1337 04/16/20 0412  WBC 6.5 7.0  HGB 12.8 12.0  HCT 39.0 37.7  PLT 215 153   Recent Labs  Lab 04/19/2020 1337 04/16/20 0412  NA 129* 134*  K 3.8 3.4*  CL 95* 99  CO2 21* 20*  BUN 21* 19  CREATININE 0.74 0.59  CALCIUM 8.9 8.4*  PROT  --   5.6*  BILITOT  --  1.0  ALKPHOS  --  45  ALT  --  36  AST  --  67*  GLUCOSE 212* 84    Imaging/Diagnostic Tests: CT Head IMPRESSION: No evidence of acute intracranial abnormality.   Sharion Settler, DO 04/16/2020, 5:57 AM PGY-1, [facility] Intern pager: [phone], text pages welcome

## 2020-04-16 NOTE — ED Notes (Signed)
RN notified about vitals 

## 2020-04-17 ENCOUNTER — Encounter (HOSPITAL_COMMUNITY): Payer: Medicare Other | Admitting: Cardiology

## 2020-04-17 ENCOUNTER — Inpatient Hospital Stay (HOSPITAL_COMMUNITY): Payer: Medicare Other

## 2020-04-17 ENCOUNTER — Encounter (HOSPITAL_COMMUNITY): Payer: Self-pay | Admitting: Family Medicine

## 2020-04-17 ENCOUNTER — Inpatient Hospital Stay (HOSPITAL_COMMUNITY): Payer: Medicare Other | Admitting: Certified Registered"

## 2020-04-17 ENCOUNTER — Encounter (HOSPITAL_COMMUNITY): Admission: EM | Disposition: E | Payer: Self-pay | Source: Home / Self Care | Attending: Family Medicine

## 2020-04-17 DIAGNOSIS — R918 Other nonspecific abnormal finding of lung field: Secondary | ICD-10-CM | POA: Diagnosis not present

## 2020-04-17 DIAGNOSIS — I340 Nonrheumatic mitral (valve) insufficiency: Secondary | ICD-10-CM

## 2020-04-17 DIAGNOSIS — I959 Hypotension, unspecified: Secondary | ICD-10-CM | POA: Diagnosis not present

## 2020-04-17 DIAGNOSIS — I351 Nonrheumatic aortic (valve) insufficiency: Secondary | ICD-10-CM

## 2020-04-17 DIAGNOSIS — I5022 Chronic systolic (congestive) heart failure: Secondary | ICD-10-CM | POA: Diagnosis not present

## 2020-04-17 DIAGNOSIS — D849 Immunodeficiency, unspecified: Secondary | ICD-10-CM

## 2020-04-17 HISTORY — PX: HEMOSTASIS CONTROL: SHX6838

## 2020-04-17 HISTORY — PX: BRONCHIAL NEEDLE ASPIRATION BIOPSY: SHX5106

## 2020-04-17 HISTORY — PX: BRONCHIAL BRUSHINGS: SHX5108

## 2020-04-17 HISTORY — PX: TEE WITHOUT CARDIOVERSION: SHX5443

## 2020-04-17 HISTORY — PX: ELECTROMAGNETIC NAVIGATION BROCHOSCOPY: SHX5369

## 2020-04-17 HISTORY — PX: VIDEO BRONCHOSCOPY: SHX5072

## 2020-04-17 HISTORY — PX: BRONCHIAL WASHINGS: SHX5105

## 2020-04-17 LAB — BASIC METABOLIC PANEL
Anion gap: 18 — ABNORMAL HIGH (ref 5–15)
BUN: 23 mg/dL — ABNORMAL HIGH (ref 6–20)
CO2: 42 mmol/L — ABNORMAL HIGH (ref 22–32)
Calcium: 12.4 mg/dL — ABNORMAL HIGH (ref 8.9–10.3)
Chloride: 92 mmol/L — ABNORMAL LOW (ref 98–111)
Creatinine, Ser: 1.32 mg/dL — ABNORMAL HIGH (ref 0.44–1.00)
GFR, Estimated: 48 mL/min — ABNORMAL LOW (ref >60)
Glucose, Bld: 482 mg/dL — ABNORMAL HIGH (ref 70–99)
Potassium: 6.7 mmol/L (ref 3.5–5.1)
Sodium: 152 mmol/L — ABNORMAL HIGH (ref 135–145)

## 2020-04-17 LAB — COMPREHENSIVE METABOLIC PANEL
ALT: 57 U/L — ABNORMAL HIGH (ref 0–44)
AST: 117 U/L — ABNORMAL HIGH (ref 15–41)
Albumin: 2.4 g/dL — ABNORMAL LOW (ref 3.5–5.0)
Alkaline Phosphatase: 71 U/L (ref 38–126)
Anion gap: 10 (ref 5–15)
BUN: 22 mg/dL — ABNORMAL HIGH (ref 6–20)
CO2: 23 mmol/L (ref 22–32)
Calcium: 8.8 mg/dL — ABNORMAL LOW (ref 8.9–10.3)
Chloride: 98 mmol/L (ref 98–111)
Creatinine, Ser: 0.71 mg/dL (ref 0.44–1.00)
GFR, Estimated: >60 mL/min (ref >60)
Glucose, Bld: 361 mg/dL — ABNORMAL HIGH (ref 70–99)
Potassium: 4.5 mmol/L (ref 3.5–5.1)
Sodium: 131 mmol/L — ABNORMAL LOW (ref 135–145)
Total Bilirubin: 0.6 mg/dL (ref 0.3–1.2)
Total Protein: 5.6 g/dL — ABNORMAL LOW (ref 6.5–8.1)

## 2020-04-17 LAB — GLUCOSE, CAPILLARY
Glucose-Capillary: 262 mg/dL — ABNORMAL HIGH (ref 70–99)
Glucose-Capillary: 158 mg/dL — ABNORMAL HIGH (ref 70–99)
Glucose-Capillary: 140 mg/dL — ABNORMAL HIGH (ref 70–99)
Glucose-Capillary: 241 mg/dL — ABNORMAL HIGH (ref 70–99)
Glucose-Capillary: 408 mg/dL — ABNORMAL HIGH (ref 70–99)

## 2020-04-17 LAB — APTT
aPTT: 133 seconds — ABNORMAL HIGH (ref 24–36)
aPTT: 40 seconds — ABNORMAL HIGH (ref 24–36)

## 2020-04-17 LAB — POCT I-STAT 7, (LYTES, BLD GAS, ICA,H+H)
Acid-base deficit: 11 mmol/L — ABNORMAL HIGH (ref 0.0–2.0)
Bicarbonate: 14.9 mmol/L — ABNORMAL LOW (ref 20.0–28.0)
Calcium, Ion: 1.11 mmol/L — ABNORMAL LOW (ref 1.15–1.40)
HCT: 40 % (ref 36.0–46.0)
Hemoglobin: 13.6 g/dL (ref 12.0–15.0)
O2 Saturation: 85 %
Patient temperature: 98.6
Potassium: 7.2 mmol/L (ref 3.5–5.1)
Sodium: 127 mmol/L — ABNORMAL LOW (ref 135–145)
TCO2: 16 mmol/L — ABNORMAL LOW (ref 22–32)
pCO2 arterial: 33.4 mmHg (ref 32.0–48.0)
pH, Arterial: 7.256 — ABNORMAL LOW (ref 7.350–7.450)
pO2, Arterial: 56 mmHg — ABNORMAL LOW (ref 83.0–108.0)

## 2020-04-17 LAB — PROTIME-INR
INR: 2.1 — ABNORMAL HIGH (ref 0.8–1.2)
Prothrombin Time: 23.1 seconds — ABNORMAL HIGH (ref 11.4–15.2)

## 2020-04-17 LAB — MRSA PCR SCREENING: MRSA by PCR: NEGATIVE

## 2020-04-17 LAB — CBC
HCT: 36.8 % (ref 36.0–46.0)
Hemoglobin: 12.6 g/dL (ref 12.0–15.0)
MCH: 34.1 pg — ABNORMAL HIGH (ref 26.0–34.0)
MCHC: 34.2 g/dL (ref 30.0–36.0)
MCV: 99.5 fL (ref 80.0–100.0)
Platelets: 151 10*3/uL (ref 150–400)
RBC: 3.7 MIL/uL — ABNORMAL LOW (ref 3.87–5.11)
RDW: 20.1 % — ABNORMAL HIGH (ref 11.5–15.5)
WBC: 6.2 10*3/uL (ref 4.0–10.5)
nRBC: 4.4 % — ABNORMAL HIGH (ref 0.0–0.2)

## 2020-04-17 LAB — MAGNESIUM
Magnesium: 2.3 mg/dL (ref 1.7–2.4)
Magnesium: 2.5 mg/dL — ABNORMAL HIGH (ref 1.7–2.4)

## 2020-04-17 LAB — COOXEMETRY PANEL
Carboxyhemoglobin: 1.2 % (ref 0.5–1.5)
Methemoglobin: 1.2 % (ref 0.0–1.5)
O2 Saturation: 70.4 %
Total hemoglobin: 11.8 g/dL — ABNORMAL LOW (ref 12.0–16.0)

## 2020-04-17 LAB — LACTIC ACID, PLASMA: Lactic Acid, Venous: >11 mmol/L (ref 0.5–1.9)

## 2020-04-17 LAB — HEPARIN LEVEL (UNFRACTIONATED): Heparin Unfractionated: 1.98 IU/mL — ABNORMAL HIGH (ref 0.30–0.70)

## 2020-04-17 LAB — LACTATE DEHYDROGENASE: LDH: 1947 U/L — ABNORMAL HIGH (ref 98–192)

## 2020-04-17 SURGERY — CORONARY/GRAFT ACUTE MI REVASCULARIZATION
Anesthesia: LOCAL

## 2020-04-17 SURGERY — BRONCHOSCOPY, WITH FLUOROSCOPY
Anesthesia: General

## 2020-04-17 MED ORDER — ALBUMIN HUMAN 5 % IV SOLN: INTRAVENOUS | Status: DC | PRN

## 2020-04-17 MED ORDER — EPINEPHRINE HCL 5 MG/250ML IV SOLN IN NS
INTRAVENOUS | Status: AC
  Filled 2020-04-17: qty 250

## 2020-04-17 MED ORDER — ETOMIDATE 2 MG/ML IV SOLN
INTRAVENOUS | Status: DC | PRN
  Administered 2020-04-17: 12 mg via INTRAVENOUS

## 2020-04-17 MED ORDER — FENTANYL CITRATE (PF) 100 MCG/2ML IJ SOLN
INTRAMUSCULAR | Status: AC
  Filled 2020-04-17: qty 2

## 2020-04-17 MED ORDER — MORPHINE SULFATE (PF) 4 MG/ML IV SOLN
5.0000 mg | Freq: Once | INTRAVENOUS | Status: DC
  Filled 2020-04-17: qty 2

## 2020-04-17 MED ORDER — FUROSEMIDE 10 MG/ML IJ SOLN
40.0000 mg | Freq: Once | INTRAMUSCULAR | Status: AC
  Administered 2020-04-17: 40 mg via INTRAVENOUS
  Filled 2020-04-17: qty 4

## 2020-04-17 MED ORDER — HYDROMORPHONE HCL 1 MG/ML IJ SOLN
0.5000 mg | Freq: Once | INTRAMUSCULAR | Status: AC
  Administered 2020-04-17: 0.5 mg via INTRAVENOUS
  Filled 2020-04-17: qty 0.5

## 2020-04-17 MED ORDER — KETOROLAC TROMETHAMINE 30 MG/ML IJ SOLN
30.0000 mg | Freq: Once | INTRAMUSCULAR | Status: AC
  Administered 2020-04-17: 30 mg via INTRAVENOUS

## 2020-04-17 MED ORDER — MIDAZOLAM HCL 2 MG/2ML IJ SOLN
INTRAMUSCULAR | Status: AC
  Filled 2020-04-17: qty 2

## 2020-04-17 MED ORDER — CHLORHEXIDINE GLUCONATE CLOTH 2 % EX PADS
6.0000 | MEDICATED_PAD | Freq: Every day | CUTANEOUS | Status: DC
  Administered 2020-04-17: 6 via TOPICAL

## 2020-04-17 MED ORDER — PHENYLEPHRINE HCL-NACL 10-0.9 MG/250ML-% IV SOLN
INTRAVENOUS | Status: DC | PRN
  Administered 2020-04-17: 20 ug/min via INTRAVENOUS

## 2020-04-17 MED ORDER — VASOPRESSIN 20 UNITS/100 ML INFUSION FOR SHOCK
0.0000 [IU]/min | INTRAVENOUS | Status: DC
  Filled 2020-04-17: qty 100

## 2020-04-17 MED ORDER — CALCIUM CHLORIDE 10 % IV SOLN
INTRAVENOUS | Status: AC
  Filled 2020-04-17: qty 10

## 2020-04-17 MED ORDER — ROCURONIUM BROMIDE 10 MG/ML (PF) SYRINGE
PREFILLED_SYRINGE | INTRAVENOUS | Status: AC
  Filled 2020-04-17: qty 10

## 2020-04-17 MED ORDER — MIDAZOLAM HCL 2 MG/2ML IJ SOLN
INTRAMUSCULAR | Status: AC
  Filled 2020-04-17: qty 4

## 2020-04-17 MED ORDER — DIGOXIN 125 MCG PO TABS
0.1250 mg | ORAL_TABLET | Freq: Every day | ORAL | Status: DC
  Administered 2020-04-17: 0.125 mg via ORAL
  Filled 2020-04-17: qty 1

## 2020-04-17 MED ORDER — CALCIUM CHLORIDE 10 % IV SOLN
INTRAVENOUS | Status: AC
  Filled 2020-04-17: qty 20

## 2020-04-17 MED ORDER — HEPARIN (PORCINE) 25000 UT/250ML-% IV SOLN
900.0000 [IU]/h | INTRAVENOUS | Status: DC
  Administered 2020-04-17: 900 [IU]/h via INTRAVENOUS
  Filled 2020-04-17: qty 250

## 2020-04-17 MED ORDER — ONDANSETRON HCL 4 MG/2ML IJ SOLN
INTRAMUSCULAR | Status: DC | PRN
  Administered 2020-04-17: 4 mg via INTRAVENOUS

## 2020-04-17 MED ORDER — INSULIN DETEMIR 100 UNIT/ML ~~LOC~~ SOLN
10.0000 [IU] | Freq: Every day | SUBCUTANEOUS | Status: DC
  Filled 2020-04-17: qty 0.1

## 2020-04-17 MED ORDER — POLYETHYLENE GLYCOL 3350 17 G PO PACK: 17.0000 g | PACK | Freq: Every day | ORAL | Status: DC

## 2020-04-17 MED ORDER — ETOMIDATE 2 MG/ML IV SOLN: 0.3000 mg/kg | Freq: Once | INTRAVENOUS | Status: DC

## 2020-04-17 MED ORDER — EPINEPHRINE 1 MG/10ML IJ SOSY
PREFILLED_SYRINGE | INTRAMUSCULAR | Status: AC
  Filled 2020-04-17: qty 20

## 2020-04-17 MED ORDER — MIDAZOLAM HCL 2 MG/2ML IJ SOLN: 2.0000 mg | INTRAMUSCULAR | Status: DC | PRN

## 2020-04-17 MED ORDER — DOCUSATE SODIUM 50 MG/5ML PO LIQD: 100.0000 mg | Freq: Two times a day (BID) | ORAL | Status: DC

## 2020-04-17 MED ORDER — SUCCINYLCHOLINE CHLORIDE 20 MG/ML IJ SOLN: 1.5000 mg/kg | Freq: Once | INTRAMUSCULAR | Status: DC

## 2020-04-17 MED ORDER — LACTATED RINGERS IV SOLN: INTRAVENOUS | Status: DC | PRN

## 2020-04-17 MED ORDER — COLCHICINE 0.6 MG PO TABS: 0.6000 mg | ORAL_TABLET | Freq: Every day | ORAL | Status: DC

## 2020-04-17 MED ORDER — FENTANYL CITRATE (PF) 100 MCG/2ML IJ SOLN: 50.0000 ug | INTRAMUSCULAR | Status: DC | PRN

## 2020-04-17 MED ORDER — EPINEPHRINE HCL 5 MG/250ML IV SOLN IN NS: 0.5000 ug/min | INTRAVENOUS | Status: DC

## 2020-04-17 MED ORDER — NOREPINEPHRINE 4 MG/250ML-% IV SOLN
INTRAVENOUS | Status: DC | PRN
  Administered 2020-04-17: 8 ug/min via INTRAVENOUS
  Administered 2020-04-17: 6 ug/min via INTRAVENOUS

## 2020-04-17 MED ORDER — ROCURONIUM BROMIDE 50 MG/5ML IV SOLN
1.0000 mg/kg | Freq: Once | INTRAVENOUS | Status: DC
  Filled 2020-04-17: qty 7.09

## 2020-04-17 MED ORDER — SODIUM BICARBONATE 8.4 % IV SOLN
INTRAVENOUS | Status: AC
  Filled 2020-04-17: qty 100

## 2020-04-17 MED ORDER — HYDROMORPHONE HCL 1 MG/ML IJ SOLN: 1.0000 mg | INTRAMUSCULAR | Status: DC

## 2020-04-17 MED ORDER — OXYCODONE HCL 5 MG PO TABS: 10.0000 mg | ORAL_TABLET | ORAL | Status: AC

## 2020-04-17 MED ORDER — ROCURONIUM BROMIDE 100 MG/10ML IV SOLN
INTRAVENOUS | Status: DC | PRN
  Administered 2020-04-17: 50 mg via INTRAVENOUS

## 2020-04-17 MED ORDER — MIDODRINE HCL 5 MG PO TABS: 10.0000 mg | ORAL_TABLET | Freq: Three times a day (TID) | ORAL | Status: DC

## 2020-04-17 MED ORDER — FENTANYL CITRATE (PF) 100 MCG/2ML IJ SOLN
INTRAMUSCULAR | Status: DC | PRN
  Administered 2020-04-17: 25 ug via INTRAVENOUS

## 2020-04-17 MED ORDER — KETOROLAC TROMETHAMINE 15 MG/ML IJ SOLN
INTRAMUSCULAR | Status: AC
  Filled 2020-04-17: qty 1

## 2020-04-17 MED ORDER — SUGAMMADEX SODIUM 200 MG/2ML IV SOLN
INTRAVENOUS | Status: DC | PRN
  Administered 2020-04-17: 150 mg via INTRAVENOUS

## 2020-04-17 MED ORDER — MORPHINE 100MG IN NS 100ML (1MG/ML) PREMIX INFUSION
5.0000 mg/h | INTRAVENOUS | Status: DC
  Administered 2020-04-17: 5 mg/h via INTRAVENOUS
  Filled 2020-04-17: qty 100

## 2020-04-17 MED ORDER — NOREPINEPHRINE 4 MG/250ML-% IV SOLN
0.0000 ug/min | INTRAVENOUS | Status: DC
  Administered 2020-04-17: 8 ug/min via INTRAVENOUS
  Filled 2020-04-17 (×2): qty 250

## 2020-04-17 MED ORDER — LIDOCAINE 2% (20 MG/ML) 5 ML SYRINGE
INTRAMUSCULAR | Status: DC | PRN
  Administered 2020-04-17: 60 mg via INTRAVENOUS

## 2020-04-17 MED ORDER — NOREPINEPHRINE 4 MG/250ML-% IV SOLN
INTRAVENOUS | Status: AC
  Filled 2020-04-17: qty 250

## 2020-04-17 MED ORDER — MORPHINE SULFATE (PF) 4 MG/ML IV SOLN
8.0000 mg | Freq: Once | INTRAVENOUS | Status: AC
  Administered 2020-04-17: 8 mg via INTRAVENOUS

## 2020-04-17 MED ORDER — LACTATED RINGERS IV SOLN: INTRAVENOUS | Status: DC

## 2020-04-17 MED ORDER — DEXAMETHASONE SODIUM PHOSPHATE 10 MG/ML IJ SOLN
INTRAMUSCULAR | Status: DC | PRN
  Administered 2020-04-17: 10 mg via INTRAVENOUS

## 2020-04-17 MED ORDER — ETOMIDATE 2 MG/ML IV SOLN
INTRAVENOUS | Status: AC
  Filled 2020-04-17: qty 20

## 2020-04-18 ENCOUNTER — Encounter (HOSPITAL_COMMUNITY): Payer: Self-pay | Admitting: Emergency Medicine

## 2020-04-18 ENCOUNTER — Other Ambulatory Visit: Payer: Self-pay | Admitting: Family Medicine

## 2020-04-18 LAB — ACID FAST SMEAR (AFB, MYCOBACTERIA)
Acid Fast Smear: NEGATIVE
Acid Fast Smear: NEGATIVE

## 2020-04-18 LAB — POCT I-STAT, CHEM 8
BUN: 39 mg/dL — ABNORMAL HIGH (ref 6–20)
Calcium, Ion: 1.67 mmol/L (ref 1.15–1.40)
Chloride: 95 mmol/L — ABNORMAL LOW (ref 98–111)
Creatinine, Ser: 1.4 mg/dL — ABNORMAL HIGH (ref 0.44–1.00)
Glucose, Bld: >700 mg/dL (ref 70–99)
HCT: 28 % — ABNORMAL LOW (ref 36.0–46.0)
Hemoglobin: 9.5 g/dL — ABNORMAL LOW (ref 12.0–15.0)
Potassium: 7.5 mmol/L (ref 3.5–5.1)
Sodium: 132 mmol/L — ABNORMAL LOW (ref 135–145)
TCO2: 24 mmol/L (ref 22–32)

## 2020-04-18 LAB — ECHO TEE
MV M vel: 5.23 m/s
MV Peak grad: 109.4 mmHg
Radius: 0.8 cm

## 2020-04-18 LAB — CYTOLOGY - NON PAP

## 2020-04-18 MED FILL — Medication: Qty: 1 | Status: AC

## 2020-04-19 ENCOUNTER — Encounter (HOSPITAL_COMMUNITY): Payer: Medicare Other | Admitting: Cardiology

## 2020-04-19 LAB — BLASTOMYCES ANTIGEN: Blastomyces Antigen: NOT DETECTED ng/mL

## 2020-04-20 LAB — CULTURE, RESPIRATORY W GRAM STAIN
Culture: NO GROWTH
Culture: NO GROWTH

## 2020-04-20 LAB — CULTURE, BLOOD (ROUTINE X 2)
Culture: NO GROWTH
Special Requests: ADEQUATE

## 2020-04-21 LAB — CULTURE, BLOOD (ROUTINE X 2): Culture: NO GROWTH

## 2020-04-23 ENCOUNTER — Encounter (HOSPITAL_COMMUNITY): Payer: Self-pay | Admitting: *Deleted

## 2020-04-23 NOTE — Progress Notes (Signed)
Received pt's DC from [facility], form completed and signed by Dr Aundra Dubin. Faxed back to them at [phone], they are aware and will p/u original from front desk.

## 2020-05-07 LAB — FUNGITELL, SERUM: Fungitell Result: >500 pg/mL — ABNORMAL HIGH (ref <80)

## 2020-05-11 NOTE — Transfer of Care (Signed)
Immediate Anesthesia Transfer of Care Note  Patient: CORENA TILSON  Procedure(s) Performed: VIDEO BRONCHOSCOPY WITH FLUORO (N/A ) ELECTROMAGNETIC NAVIGATION BRONCHOSCOPY (N/A ) BRONCHIAL BRUSHINGS BRONCHIAL NEEDLE ASPIRATION BIOPSIES BRONCHIAL WASHINGS HEMOSTASIS CONTROL TRANSESOPHAGEAL ECHOCARDIOGRAM (TEE) (N/A )  Patient Location: ICU  Anesthesia Type:General  Level of Consciousness: awake, alert  and oriented  Airway & Oxygen Therapy: Patient Spontanous Breathing and Patient connected to face mask oxygen  Post-op Assessment: Report given to RN and Post -op Vital signs reviewed and stable  Post vital signs: Reviewed and stable  Last Vitals:  Vitals Value Taken Time  BP 77/30 05/09/2020 1328  Temp    Pulse 105 05/09/2020 1331  Resp 18 05-09-2020 1331  SpO2 96 % May 09, 2020 1331  Vitals shown include unvalidated device data.  Last Pain:  Vitals:   05-09-20 0748  TempSrc: Oral  PainSc:          Complications: No complications documented.

## 2020-05-11 NOTE — Progress Notes (Signed)
[place] for Heparin  Indication: atrial fibrillation and hx DVT  Allergies  Allergen Reactions  . Oxycodone Nausea And Vomiting  . Ace Inhibitors Other (See Comments)    Flu-like symptoms  . Augmentin [Amoxicillin-Pot Clavulanate] Nausea And Vomiting  . Sulfa Antibiotics Nausea And Vomiting    Patient Measurements: Height: 5' (152.4 cm) Weight: 70.9 kg (156 lb 4.9 oz) IBW/kg (Calculated) : 45.5 Heparin Dosing Weight: 65 kg   Vital Signs: Temp: 99.5 F (37.5 C) (12/08 0748) Temp Source: Oral (12/08 0748) BP: 104/85 (12/08 0748) Pulse Rate: 111 (12/08 0748)  Labs: Recent Labs    05/06/2020 1337 05/03/2020 1337 04/20/2020 1505 04/16/20 0412 04/16/20 0412 04/16/20 1512 04/16/20 1634 04/16/20 1908 05/09/2020 0034  HGB 12.8   < >  --  12.0  --   --   --   --  12.6  HCT 39.0  --   --  37.7  --   --   --   --  36.8  PLT 215  --   --  153  --   --   --   --  151  APTT  --   --   --  109*   < > >200* 85*  --  133*  HEPARINUNFRC  --   --   --   --   --  >2.20* >2.20*  --  1.98*  CREATININE 0.74   < >  --  0.59  --   --   --  0.79 0.71  TROPONINIHS 4,097*  --  4,753*  --   --   --   --   --   --    < > = values in this interval not displayed.    Estimated Creatinine Clearance: 69.9 mL/min (by C-G formula based on SCr of 0.71 mg/dL).  Assessment: 55 yr old female with significant cardiac history on apixaban at home for hx of  DVT and Afib. Pharmacy was consulted to transition patient to IV heparin while admitted. Patient confirms that her last dose of apixaban was around 8 AM on 12/6. Given recent apixaban exposure, will monitor anticoagulation using aPTT until aPTT and heparin levels correlate.  aPTT was therpaeutic at 85 sec last night, repeat aPTT was supratherapeutic at 133 on 900 units/hr. Unsure how the midnight aPTT was drawn and heparin stopped at 04:00 for procedure. Heparin level 1.98 and is still being influenced by apixaban. No  bleeding noted, Hgb stable in 12s, platelets are low normal.  Patient is s/p bronch and TEE. Dr Lamonte Sakai ok to resume IV heparin at 19:00.  Goal of Therapy:  Heparin level 0.3-0.7 units/ml aPTT 66-102 seconds Monitor platelets by anticoagulation protocol: Yes   Plan:  Resume heparin infusion at 900 units/hr with no bolus 6 hr aPTT Monitor daily aPTT, heparin level, CBC Monitor for signs/symtoms of bleeding  Thank you for involving pharmacy in this patient's care.  Renold Genta, PharmD, BCPS Clinical Pharmacist Clinical phone for April 18, 2020 until 3p is [phone] 05/02/2020 1:59 PM  **Pharmacist phone directory can be found on [email or web address] listed under [place]**

## 2020-05-11 NOTE — Code Documentation (Signed)
  Patient Name: Lisa Poole   MRN: [id number]   Date of Birth/ Sex: 1966/01/27 , female      Admission Date: 04/22/2020  Attending Provider: Candee Furbish, MD  Primary Diagnosis: Multiple pulmonary nodules   Indication: Pt was in her usual state of health until this PM, when she was noted to be in PEA. Code blue was subsequently called. At the time of arrival on scene, ACLS protocol was underway.   Technical Description:  - CPR performance duration:  7 minutes  - Was defibrillation or cardioversion used? Yes   - Was external pacer placed? No  - Was patient intubated pre/post CPR? Yes   Medications Administered: Y = Yes; Blank = No Amiodarone    Atropine    Calcium  Y  Epinephrine  Y  Lidocaine    Magnesium    Norepinephrine    Phenylephrine    Sodium bicarbonate  Y  Vasopressin     Post CPR evaluation:  - Final Status - Was patient successfully resuscitated ? Yes - What is current rhythm? Wide complex tachycardia - What is current hemodynamic status? Hemodynamically stable   Miscellaneous Information:  - Labs sent, including: No, per primary  - Primary team notified?  Yes  - Family Notified? No, per primary  - Additional notes/ transfer status: Found to be in PEA and CPR started. Found to be in V-fib and was defibrillated. ROSC achieved after 7 minutes. PCCM present for further care.     Virl Axe, MD  04/11/2020, 9:41 PM

## 2020-05-11 NOTE — Progress Notes (Signed)
PT Cancellation Note  Patient Details Name: Lisa Poole MRN: [id number] DOB: December 08, 1965   Cancelled Treatment:    Reason Eval/Treat Not Completed: Patient at procedure or test/unavailable (bronch/biopsy).  Wyona Almas, PT, DPT Acute Rehabilitation Services Pager [phone] Office [phone]    Deno Etienne 2020-05-11, 10:14 AM

## 2020-05-11 NOTE — CV Procedure (Signed)
Procedure: TEE  Sedation: Per anesthesiology  Indication: Endocarditis  Findings: Please see echo section for full report.  The left ventricle was mildly dilated with severe diffuse hypokinesis, EF 25-30%. No LV thrombus noted.  The right ventricle was normal in size with mildly decreased systolic function.  Mild left atrial enlargement, no LA appendage thrombus.  Normal right atrium.  There was a pacemaker in the right heart, no vegetation visualized on leads.  Mild tricuspid regurgitation, peak RV-RA gradient 31 mmHg, no vegetation noted.  Mild PI, no pulmonary valve vegetation.  Trileaflet, mildly calcified aortic valve with no stenosis and mild-moderate aortic insufficiency.  No aortic valve vegetation.  There is some restriction of the posterior mitral leaflet with severe MR (PISA ERO 0.44 cm^2). No mitral valve vegetation. Suspect functional mitral regurgitation.  There is flattening but not reversal of the pulmonary vein systolic doppler pattern. Normal caliber thoracic aorta with minimal plaque.  Trivial pericardial effusion.   Impression: No evidence for endocarditis.   Loralie Champagne 05-02-20 1:01 PM

## 2020-05-11 NOTE — Anesthesia Postprocedure Evaluation (Signed)
Anesthesia Post Note  Patient: KOI YARBRO  Procedure(s) Performed: VIDEO BRONCHOSCOPY WITH FLUORO (N/A ) ELECTROMAGNETIC NAVIGATION BRONCHOSCOPY (N/A ) BRONCHIAL BRUSHINGS BRONCHIAL NEEDLE ASPIRATION BIOPSIES BRONCHIAL WASHINGS HEMOSTASIS CONTROL TRANSESOPHAGEAL ECHOCARDIOGRAM (TEE) (N/A )     Patient location during evaluation: ICU Anesthesia Type: General Level of consciousness: awake Pain management: pain level controlled Vital Signs Assessment: post-procedure vital signs reviewed and stable Respiratory status: spontaneous breathing and nonlabored ventilation Cardiovascular status: stable Postop Assessment: no apparent nausea or vomiting Anesthetic complications: no Comments: Spoke w Dr Lamonte Sakai about ICU post op " he will take care of everything " and will also check line placement.   No complications documented.  Last Vitals:  Vitals:   05/03/2020 0350 05-03-20 0748  BP: 126/80 104/85  Pulse: 97 (!) 111  Resp: 14 20  Temp: 37.3 C 37.5 C  SpO2: 93% 93%    Last Pain:  Vitals:   05/03/20 0748  TempSrc: Oral  PainSc:                  Barnet [place]

## 2020-05-11 NOTE — Progress Notes (Addendum)
eLink Physician-Brief Progress Note Patient Name: Lisa Poole DOB: [birth year] MRN: [id number]   Date of Service  2020/05/09  HPI/Events of Note  Patient s/p bronchoscopy with BAL earlier today who developed abrupt desaturation and altered mental status.  eICU Interventions  Stat portable CXR ordered, patient Ambu- bagged, ground crew requested to evaluate patient stat. Ground crew came into the room and took over care.         Lisa Poole 09-May-2020, 10:44 PM

## 2020-05-11 NOTE — Significant Event (Signed)
Rapid Response Event Note   Reason for Call :  RN activating a code STEMI  Initial Focused Assessment:  Pt lying in bed, grimacing. She endorses 10/10 pain. She states her pain is in her mid back and radiating to her right shoulder. Heart sounds are distant. Lung sounds are clear, diminished. Skin is warm, dry.   VS: BP 82/57, HR 108, RR 32, SpO2 98% on 4LNC  Interventions:  No intervention from RRRN -30mg  IV Toradol with improvement in pain to 7/10 after 10 minutes  Plan of Care:  RN to call rapid response for additional needs  Event Summary:  MD Notified: Dr. Lamonte Sakai at bedside upon my arrival Call Time: [place] Time: 1023 End Time: [place], RN

## 2020-05-11 NOTE — Progress Notes (Signed)
PCCM Interval Note  Patient still with significant back pain, some pleuritic and MSK chest pain as well. Dilaudid not given due to marginal BP.   ECG now abnormal >> intermittently paced with PVC's, com[parable to her priors.   Reviewed status with Dr Aundra Dubin, agree that we should delay procedures for now until we can establish stability. Hopefully will still be able to proceed today.  Appreciate Cardiology assistance.   If the FOB is postponed, then I would recommend starting empiric abx even without cx data.    Baltazar Apo, MD, PhD 02-May-2020, 10:35 AM [facility] [phone] or if no answer [phone]

## 2020-05-11 NOTE — Progress Notes (Addendum)
Seen post bronch, doing okay but has headache.  PCCM will be primary while patient admitted to [facility] per 2H policy, FMTS can take over once transfers out.  Erskine Emery MD PCCM

## 2020-05-11 NOTE — Progress Notes (Signed)
At approximately 2100, patient's O2 sat stopped reading consistently, only intermittently picking up. RN adjusted and changed sat probe. Pt restless and also complaining of severe abdominal cramping. Pt denied feeling short of breath or having difficulty breathing, but upon visual inspection, patient appeared to have labored, shallow breathing. Pt. Diaphoretic. When able to pick up, O2 sat reading in mid-80s. Pt titrated up on O2 and switched to HFNC at 15 L. RT to bedside and obtained ABG and FSBS. Pt still alert and interactive at this point. CCM called  via ELink (camera'd into room) and updated with abg/cbg and assessment findings. Ordered stat CXR and KUB, and sent CCM ground team. Meanwhile, pt became minimally responsive requiring stimulation. O2 sat and blood pressure continued dropping. RNs bagged patient using ambubag and titrated up levophed. Ground team arrived and updated, preparing for intubation when pt soon went PEA. See code sheet for details. Dr. Haroldine Laws to bedside during code directing initiation/titration of drips. Dr. Haroldine Laws called family and updated them multiple times as they drove in. On family's arrival, decision was made to withdraw care. Morphine administered and morphine gtt started. Vasopressors turned off. Dr. Haroldine Laws extubated patient. Pt. expired at 2253 with family and Dr. Haroldine Laws at bedside.

## 2020-05-11 NOTE — Progress Notes (Signed)
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   History of present illness    55 year old female with history of never smoker, PAF on apixiban, VT s/p ICD, CHB s/p CRT-D, systolic heart failure/ NICM, suspected cardiac sarcoidosis- followed at [facility], RIJ DVT, DMT2, HTN, HLD, and COVID-19 (08/2019) admitted to [facility] on 12/6 with one week history of chest pain and dyspnea.    Prior to fall 2020, patient had no cardiac history.  She developed complete heart block requiring pacemaker placement 02/2019.  In March 2021, she contracted COVID-19 but did not require hospitalization.  In 7/21, she was admitted with new onset heart failure with cardiogenic shock and episodes of VT/ torsades.  Workup negative for significant coronary artery disease, however cMRI concerning for cardiac sarcoidosis vs prior myocarditis in which she was started on prednisone and referred to [facility].  She was on amiodarone switched to oral mexiletine at discharge. HRCT 7/21 showed areas of peribronchovascular ground-glass in the lower lobes but no definite evidence for pulmonary sarcoidosis.   After discharge, she developed a right groin infection from MRSA with negative blood cultures treated with linezolid with ID input.  She was admitted again 12/2019 for her ICD firing when her prednisone was tapered and since remained on prednisone and methotrexate since August 2021.  She was admitted again 9/21 with VT after prednisone taper, seen by Dr. Lake Bells at that time for concern of possible inflammatory process and concern of pulmonary sarcoidosis;   see full pulmonary consult note on 02/06/2020.   Amiodarone restarted and mexiletine stopped and prednisone increased again.  She underwent bronchoscopy with transbronchial biopsies and BAL on 9/30.  RLL biopsy was benign with no granulomata or malignancy identified. BAL, fungal, and AFB were negative.  Additionally, HRCT scan repeated on 9/30 revealed a 43m LUL nodule noted, no pulmonary parenchymal infiltrate, and calcified mediastinal nodes but no adenopathy with recommendations for repeat CT in 3 months.  Was started on bactrim for Pneumocystis prophylaxis on 10/1.   [facility] presumed cardiac sarcoidosis and continued on high dose prednisone, and methotrexate dose increased.  Virology testing performed then [facility] however only received one infusion.  Additionally, she was treated for thrush with fluconazole.    This admission, she presented with one week history of chest and back tightness and dyspnea, worse on inspiration.  Also reports development of non blanching, non raised, non-puritic rash that started last week.  Denies recent fever, chills, cough, URI, sick contacts, or recent travel.  She currently is sweating but reports that's not new and has been happening for years.  She was was noted to have lower blood pressure.  Home meds held and started on midodrine with improvement.  Labs noted for BNP 492, troponin hs  4863-> 4753, sed rate 73, and CXR showing patchy airspace and interstitial opacities.  CTA was negative for PE but showed new numerous bilateral pulmonary nodules (since 02/07/2020), some of which are cavitary with superimposed chronic sequelae of granulomatous disease in the  chest and upper abd.  ID and pulmonary consulted for further recommendations and testing.  Cultures sent and started on empiric vancomycin and cefepime.     Past Medical History  Never smoker, PAF on  apixiban, VT s/p ICD, CHB s/p CRT-D, systolic heart failure/ NICM, suspected cardiac sarcoidosis- followed at [facility], RIJ DVT, DMT2, HTN, HLD COVID-19 (08/2019 not requiring hospitalization)  Significant Hospital Events   12/6 Admitted to FPTS  Consults:  HF ID Pulmonary   Procedures:   Significant Diagnostic Tests:  12/6 CTA PE >> 1. Negative for acute pulmonary embolus. 2. However, there are innumerable new bilateral pulmonary nodule since September, the largest of which are cavitary. Some more generalized pulmonary consolidation (left lung apex). No mediastinal lymphadenopathy or pleural effusion. Top differential considerations include septic emboli, and endemic or invasive fungal disease (Aspergillus, Candida, mucormycosis, etc). Recommend blood cultures. 3. Superimposed chronic sequelae of granulomatous disease in the chest and upper abdomen. 4. Calcified coronary artery atherosclerosis. Addendum: Study discussed by telephone with a provider on the overnight medicine team on 04/26/2020 at 2100 hours. She advised that patient on methotrexate for cardiac sarcoidosis.  We discussed that methotrexate lung toxicity can have an appearance of organizing pneumonia (as might be seen in the left lung apex of this study), but drug toxicity seems less likely than a disseminated infection given both the overall pattern of disease and the patient's immunocompromised state. Blood cultures are pending at this time.  Fulminant pulmonary sarcoidosis also seems unlikely given little to no involvement of the lungs in September.  Micro Data:  12/6 SARS2/ Flu >> neg 12/6 RVP >>  12/7 BCx 2 >>  Antimicrobials:  12/6 vancomycin 12/6 cefepime   Interim history/subjective:   Patient evaluated in pre-op area - she is very uncomfortable with HA and back pain.  Tachycardic, hypotensive to 70's SBP > VS were stable prior to transfer   Objective   Blood pressure 104/85, pulse (!) 111, temperature 99.5 F  (37.5 C), temperature source Oral, resp. rate 20, height 5' (1.524 m), weight 70.9 kg, last menstrual period 07/25/2015, SpO2 93 %.        Intake/Output Summary (Last 24 hours) at 05-16-20 0943 Last data filed at 05-16-2020 0855 Gross per 24 hour  Intake 282.05 ml  Output 550 ml  Net -267.95 ml   Filed Weights   04/27/2020 1442 16-May-2020 0043 May 16, 2020 0350  Weight: 64.9 kg 70.7 kg 70.9 kg   Examination: General: chronically ill woman, in discomfort,  HEENT: oral thrush Neuro: awake, able to converse, non-focal CV: tachy 102 - 130, irregular, no M PULM:  tachypneic due to pain.  GI: obese non-distended, + BS Extremities: cool, trace LE edema Skin: bruising to right forearm and petechial rash to abd and inner thighs   Resolved Hospital Problem list    Assessment & Plan:   Acute on chronic back pain, flaring currently -will try some low dose dilaudid since she is NPO, careful given her BP  Shock. Cool extremities with hx known cardiac disease. Freq ventricular beats on telemetry. Certainly high risk sepsis and septic shock.  -currently off abx, would restart empiric therapy after cx data obtained today, FOB -consider IVF bolus now, gentle given her cardiac disease -she didn't receive her prednisone yet today, could consider single dose hydrocort  Bilateral pulmonary nodules with cavitations concerning for septic emboli in the immunocompromised host  - CTA negative for PE - wide ddx most concerning for infectious/fungal etiology vs pulmonary sarcoidosis vs drug toxicity (  on amiodarone and methotrexate, less likely given imaging) vs low suspicion for malignancy  P:  -ENB, cx's and biopsies today if we believe she is stable to go. Would like to get her pain and BP better managed before general anesthesia  - Serum fungitell, blastomyces, and cryptococcal antigens ordered - Urine histoplasma and blastomyces antigen ordered - RVP pending - follow blood cultures - continue  supplemental O2 for sat goal > 92% - ongoing pulmonary hygiene/ IS    Labs   CBC: Recent Labs  Lab 04/22/2020 1337 04/16/20 0412 2020-04-28 0034  WBC 6.5 7.0 6.2  NEUTROABS  --  6.5  --   HGB 12.8 12.0 12.6  HCT 39.0 37.7 36.8  MCV 102.4* 103.3* 99.5  PLT 215 153 361    Basic Metabolic Panel: Recent Labs  Lab 04/20/2020 1337 05/10/2020 1412 04/16/20 0412 04/16/20 1908 04/28/2020 0034  NA 129*  --  134* 127* 131*  K 3.8  --  3.4* 5.3* 4.5  CL 95*  --  99 96* 98  CO2 21*  --  20* 19* 23  GLUCOSE 212*  --  84 465* 361*  BUN 21*  --  19 25* 22*  CREATININE 0.74  --  0.59 0.79 0.71  CALCIUM 8.9  --  8.4* 8.5* 8.8*  MG  --  1.5* 2.5* 2.2 2.3  PHOS  --   --  3.8  --   --    GFR: Estimated Creatinine Clearance: 69.9 mL/min (by C-G formula based on SCr of 0.71 mg/dL). Recent Labs  Lab 04/23/2020 1337 04/16/20 0412 2020-04-28 0034  WBC 6.5 7.0 6.2    Liver Function Tests: Recent Labs  Lab 04/16/20 0412 28-Apr-2020 0034  AST 67* 117*  ALT 36 57*  ALKPHOS 45 71  BILITOT 1.0 0.6  PROT 5.6* 5.6*  ALBUMIN 2.6* 2.4*   No results for input(s): LIPASE, AMYLASE in the last 168 hours. No results for input(s): AMMONIA in the last 168 hours.  ABG    Component Value Date/Time   PHART 7.520 (H) 12/02/2019 1106   PCO2ART 51.1 (H) 12/02/2019 1106   PO2ART 482 (H) 12/02/2019 1106   HCO3 29.3 (H) 02/09/2020 1249   TCO2 31 02/09/2020 1249   O2SAT 74.0 02/09/2020 1249     Coagulation Profile: No results for input(s): INR, PROTIME in the last 168 hours.  Cardiac Enzymes: No results for input(s): CKTOTAL, CKMB, CKMBINDEX, TROPONINI in the last 168 hours.  HbA1C: Hgb A1c MFr Bld  Date/Time Value Ref Range Status  04/16/2020 04:12 AM 8.6 (H) 4.8 - 5.6 % Final    Comment:    (NOTE) Pre diabetes:          5.7%-6.4%  Diabetes:              >6.4%  Glycemic control for   <7.0% adults with diabetes   02/06/2020 10:08 AM 10.2 (H) 4.8 - 5.6 % Final    Comment:    (NOTE) Pre  diabetes:          5.7%-6.4%  Diabetes:              >6.4%  Glycemic control for   <7.0% adults with diabetes     CBG: Recent Labs  Lab 04/16/20 1222 04/16/20 1643 04/16/20 1928 2020/04/28 0212 04/28/20 0612  GLUCAP 167* 381* 449* 262* 158*    Review of Systems:   As above       Baltazar Apo, MD, PhD 2020-04-28, 9:56 AM [facility] [phone] or if no answer [phone]

## 2020-05-11 NOTE — Progress Notes (Signed)
   May 17, 2020 1025  Clinical Encounter Type  Visited With Patient not available  Visit Type Code  Referral From Nurse  Consult/Referral To Chaplain   Chaplain responded to Code STEMI. Pt unavailable and no chaplain needed at the moment. Code STEMI eventually cancelled. Chaplain remains available as needed.    This note was prepared by Chaplain Resident, Dante Gang, MDiv. Chaplain remains available as needed through the on-call pager: [phone].

## 2020-05-11 NOTE — Op Note (Signed)
Video Bronchoscopy with Electromagnetic Navigation Procedure Note  Date of Operation: May 12, 2020  Pre-op Diagnosis: Multiple bilateral pulmonary nodules  Post-op Diagnosis: Same  Surgeon: Baltazar Apo  Assistants: None  Anesthesia: General endotracheal anesthesia  Operation: Flexible video fiberoptic bronchoscopy with electromagnetic navigation and biopsies.  Estimated Blood Loss: less than 50   Complications: None apparent  Indications and History: Lisa Poole is a 55 y.o. female with complicated past medical history.  She is on immunosuppressive medications for cardiac sarcoidosis.  Has bilateral rounded pulmonary nodules different sizes, some with cavitation.  Recommendation was made to achieve a tissue diagnosis and culture data via navigational bronchoscopy.  The risks, benefits, complications, treatment options and expected outcomes were discussed with the patient.  The possibilities of pneumothorax, pneumonia, reaction to medication, pulmonary aspiration, perforation of a viscus, bleeding, failure to diagnose a condition and creating a complication requiring transfusion or operation were discussed with the patient who freely signed the consent.    Description of Procedure: The patient was seen in the Preoperative Area, was examined and noted to have significant discomfort, and the chest and back similar to her presentation.  Question myocarditis, pericarditis.  Toradol was given x1.  Also with marginal blood pressure.  Decision was made that she needed to proceed and that we would escalate her care in order to facilitate.  A central line was placed, norepinephrine was started prior to induction and intubation.  The patient was taken to [facility] endoscopy room 2, identified as [name] and the procedure verified as Flexible Video Fiberoptic Bronchoscopy.  A Time Out was held and the above information confirmed.   Prior to the date of the procedure a high-resolution CT scan of the  chest was performed. Utilizing [employer]rest a virtual tracheobronchial tree was generated to allow the creation of distinct navigation pathways to the patient's parenchymal abnormalities. After being taken to the operating room general anesthesia was initiated and the patient  was orally intubated. The video fiberoptic bronchoscope was introduced via the endotracheal tube and a general inspection was performed which showed normal airways throughout. The extendable working channel and locator guide were introduced into the bronchoscope. The distinct navigation pathways prepared prior to this procedure were then utilized to navigate to within 0.3 cm of one of the patient's right middle lobe nodules identified on CT scan. The extendable working channel was secured into place and the locator guide was withdrawn. Under fluoroscopic guidance transbronchial needle brushings, transbronchial Wang needle biopsies, and transbronchial forceps biopsies were performed to be sent for cytology and pathology. A bronchioalveolar lavage was performed in the right lower lobe and sent for cytology and microbiology (bacterial, fungal, AFB smears and cultures). At the end of the procedure a general airway inspection was performed and there was some mild oozing from the right middle lobe airway.  This was decreasing significantly by the end of the case.  Some cold saline was instilled into the right middle lobe. The bronchoscope was removed.  The patient tolerated the procedure well.  Blood loss estimated 25 to 30 cc. A post-procedural chest x-ray is pending.  Samples: 1. Transbronchial needle brushings from right middle lobe nodule 2. Transbronchial Wang needle biopsies from right middle lobe nodule 3. Transbronchial forceps biopsies from right middle lobe nodule 4. Bronchoalveolar lavage from right middle lobe  Plans:  The patient will undergo TEE with Dr. Aundra Dubin while she is intubated and sedated.  After that she  will transition to the ICU for further care.  Hopefully she will be extubated post procedure.  We will follow her cytology, pathology, micro results when they become available.  She needs to be started on antibiotics as per ID recommendations now that her cultures have been obtained.  Baltazar Apo, MD, PhD 05-17-20, 12:38 PM [facility] [phone] or if no answer [phone]

## 2020-05-11 NOTE — Anesthesia Procedure Notes (Signed)
Arterial Line Insertion Start/End26-Dec-2021 11:13 AM, 05-05-2020 11:18 AM Performed by: Audry Pili, MD, anesthesiologist  Patient location: Pre-op. Preanesthetic checklist: patient identified, IV checked, risks and benefits discussed, surgical consent, monitors and equipment checked, pre-op evaluation, timeout performed and anesthesia consent Lidocaine 1% used for infiltration and patient sedated Left, radial was placed Catheter size: 20 G Hand hygiene performed   Attempts: 1 Procedure performed using ultrasound guided technique. Ultrasound Notes:anatomy identified, needle tip was noted to be adjacent to the nerve/plexus identified, no ultrasound evidence of intravascular and/or intraneural injection and image(s) printed for medical record Following insertion, dressing applied and Biopatch. Post procedure assessment: unchanged and normal  Patient tolerated the procedure well with no immediate complications.

## 2020-05-11 NOTE — Progress Notes (Signed)
Inpatient Diabetes Program Recommendations  AACE/ADA: New Consensus Statement on Inpatient Glycemic Control (2015)  Target Ranges:  Prepandial:   less than 140 mg/dL      Peak postprandial:   less than 180 mg/dL (1-2 hours)      Critically ill patients:  140 - 180 mg/dL   Lab Results  Component Value Date   GLUCAP 140 (H) 05/01/2020   HGBA1C 8.6 (H) 04/16/2020    Review of Glycemic Control Results for Lisa, Poole (MRN [id number]) as of 05/01/2020 14:03  Ref. Range 05-01-20 02:12 05/01/20 06:12 05/01/20 10:21  Glucose-Capillary Latest Ref Range: 70 - 99 mg/dL 262 (H) 158 (H) 140 (H)   Diabetes history: Type 2 DM Outpatient Diabetes medications: Farxiga 10 mg QD, Metformin 1000 mg BID, Glipizide 5 mg QD Current orders for Inpatient glycemic control: Novolog 0-15 units TID, Novolog 0-5 units QHS Prednisone 60 mg QAM Decadron 10 mg x 1  Inpatient Diabetes Program Recommendations:    Patient scheduled for TEE/biopsy then to transfer to ICU. Consider switching to ICU phase 1 correction 3-9 units Q4H. Secure chat sent.  Thanks, Bronson Curb, MSN, RNC-OB Diabetes Coordinator [phone] (8a-5p)

## 2020-05-11 NOTE — Progress Notes (Signed)
Patient arrive to short stay with rings on. Rings removed. TC to 3E to ha ve rings and telebox picked up. Gabriella RN picked up two rings 1 silver toned band with clear channel set stones and 1 silver tone ring with 1 elevated clear stone and channel set stones.

## 2020-05-11 NOTE — Progress Notes (Signed)
  Echocardiogram Echocardiogram Transesophageal has been performed.  Francine Hannan G Kaidyn Hernandes 04/24/20, 1:33 PM

## 2020-05-11 NOTE — Significant Event (Signed)
PCCM INTERVAL PROGRESS NOTE  Called to bedside for patient with brief period of dyspnea and then becoming unresponsive. Upon my arrival she was unresponsive and receiving BVM ventilations. As her nurses were explaining the situation to me, she lost pulses and ACLS was initiated in accordance with PEA algorithm. In addition she was treated for hyperkalemia with sodium bicarbonate, calcium, and insulin/dextrose for ISTAT K of 7.2. approximately 8 minutes into the code she had VT/VF on monitor without pulse. She was defibrillated, regaining pulses at next pulse check. See code sheet for a more detailed report.  Family contacted and are en route to the hospital.    Georgann Housekeeper, AGACNP-BC [place] for personal pager PCCM on call pager [phone]  05/05/20 10:09 PM

## 2020-05-11 NOTE — Progress Notes (Signed)
[facility] Progress Note Patient Name: Lisa Poole DOB: [birth year] MRN: [id number]   Date of Service  May 15, 2020  HPI/Events of Note  ET Tube in the right main stem per radiology on call physician.  eICU Interventions  2 H RT contacted and requested to withdraw the ET tube 3 cm.        Kerry Kass Everard Interrante 05-15-2020, 10:48 PM

## 2020-05-11 NOTE — Interval H&P Note (Signed)
History and Physical Interval Note:  04-26-2020 12:36 PM  Lisa Poole  has presented today for surgery, with the diagnosis of lung nodule.  The various methods of treatment have been discussed with the patient and family. After consideration of risks, benefits and other options for treatment, the patient has consented to  Procedure(s): VIDEO BRONCHOSCOPY WITH FLUORO (N/A) ELECTROMAGNETIC NAVIGATION BRONCHOSCOPY (N/A) TRANSESOPHAGEAL ECHOCARDIOGRAM (TEE) (N/A) BRONCHIAL BRUSHINGS BRONCHIAL NEEDLE ASPIRATION BIOPSIES BRONCHIAL WASHINGS as a surgical intervention.  The patient's history has been reviewed, patient examined, no change in status, stable for surgery.  I have reviewed the patient's chart and labs.  Questions were answered to the patient's satisfaction.     Sophy Mesler [employer]

## 2020-05-11 NOTE — Consult Note (Signed)
Responded to code blue, no family present, staff will page again if family comes/desires chaplain services..  Rev. Eloise Levels Chaplain

## 2020-05-11 NOTE — Progress Notes (Signed)
   Called to OR holding due to sharp upper back pain.   Enza says this is the same pain she had prior to admit and in the ED. Given toradol per Dr Angelica Chessman.   EKG concerning for possible STEMI. Code STEMI activated. Dr Angelena Form at the bedside. He reviewed previous LHC 11/2019 with no significant coronary disease.  SBP in the 80s.   He discussed with Dr Aundra Dubin. Code Stemi cancelled.   Dr Lamonte Sakai at bedside.  Plan for A line and central line. May need Norepi.   Per Dr Aundra Dubin plan to move forward with biopsy and TEE. She will need ICU bed after. She remains tenuous.   Jrake Rodriquez NP-C  10:59 AM

## 2020-05-11 NOTE — Progress Notes (Signed)
ETT retracted from 27cm @lip  to 22cm @lip  per CCM at bedside s/p CXR.

## 2020-05-11 NOTE — Progress Notes (Signed)
Family now at bedside with Dr. Haroldine Laws and CCM NP Eddie Dibbles.

## 2020-05-11 NOTE — Progress Notes (Addendum)
  Advanced Heart Failure Rounding Note  PCP-Cardiologist: No primary care provider on file.   Subjective:    05/07/2020- CTA with septic emboli. ID consulted. Antibiotics stopped.    Bld Cx pending CTA concerning for septic emboli.  Denies SOB. Complaining of chest soreness.   Objective:   Weight Range: 70.9 kg Body mass index is 30.53 kg/m.   Vital Signs:   Temp:  [97.8 F (36.6 C)-99.5 F (37.5 C)] 99.5 F (37.5 C) (12/08 0748) Pulse Rate:  [82-142] 111 (12/08 0748) Resp:  [14-33] 20 (12/08 0748) BP: (87-126)/(37-89) 104/85 (12/08 0748) SpO2:  [84 %-100 %] 93 % (12/08 0748) Weight:  [70.7 kg-70.9 kg] 70.9 kg (12/08 0350) Last BM Date: 04/16/20  Weight change: Filed Weights   04/26/2020 1442 04/20/2020 0043 05/10/2020 0350  Weight: 64.9 kg 70.7 kg 70.9 kg    Intake/Output:   Intake/Output Summary (Last 24 hours) at 04/21/2020 0817 Last data filed at 04/18/2020 0300 Gross per 24 hour  Intake 282.05 ml  Output 550 ml  Net -267.95 ml      Physical Exam    General:  Appears weak. No resp difficulty HEENT: normal Neck: supple. no JVD. Carotids 2+ bilat; no bruits. No lymphadenopathy or thryomegaly appreciated. Cor: PMI nondisplaced. Irregular rate & rhythm. No rubs, gallops or murmurs. Lungs: Crackles throughout on 2 liters.  Abdomen: soft, nontender, nondistended. No hepatosplenomegaly. No bruits or masses. Good bowel sounds. Extremities: no cyanosis, clubbing, rash, edema Neuro: alert & orientedx3, cranial nerves grossly intact. moves all 4 extremities w/o difficulty. Affect pleasant   Telemetry   SR 90s with frequent PVCs NSVT.   EKG    N/A  Labs    CBC Recent Labs    04/16/20 0412 04/28/2020 0034  WBC 7.0 6.2  NEUTROABS 6.5  --   HGB 12.0 12.6  HCT 37.7 36.8  MCV 103.3* 99.5  PLT 153 151   Basic Metabolic Panel Recent Labs    04/16/20 0412 04/16/20 0412 04/16/20 1908 04/13/2020 0034  NA 134*   < > 127* 131*  K 3.4*   < > 5.3* 4.5  CL 99    < > 96* 98  CO2 20*   < > 19* 23  GLUCOSE 84   < > 465* 361*  BUN 19   < > 25* 22*  CREATININE 0.59   < > 0.79 0.71  CALCIUM 8.4*   < > 8.5* 8.8*  MG 2.5*   < > 2.2 2.3  PHOS 3.8  --   --   --    < > = values in this interval not displayed.   Liver Function Tests Recent Labs    04/16/20 0412 05/05/2020 0034  AST 67* 117*  ALT 36 57*  ALKPHOS 45 71  BILITOT 1.0 0.6  PROT 5.6* 5.6*  ALBUMIN 2.6* 2.4*   No results for input(s): LIPASE, AMYLASE in the last 72 hours. Cardiac Enzymes No results for input(s): CKTOTAL, CKMB, CKMBINDEX, TROPONINI in the last 72 hours.  BNP: BNP (last 3 results) Recent Labs    11/26/19 2011 02/06/20 0725 04/27/2020 1350  BNP 1,258.1* 344.3* 492.8*    ProBNP (last 3 results) No results for input(s): PROBNP in the last 8760 hours.   D-Dimer No results for input(s): DDIMER in the last 72 hours. Hemoglobin A1C Recent Labs    04/16/20 0412  HGBA1C 8.6*   Fasting Lipid Panel No results for input(s): CHOL, HDL, LDLCALC, TRIG, CHOLHDL, LDLDIRECT in the last 72 hours.   Thyroid Function Tests No results for input(s): TSH, T4TOTAL, T3FREE, THYROIDAB in the last 72 hours.  Invalid input(s): FREET3  Other results:   Imaging    CT HEAD WO CONTRAST  Result Date: 04/16/2020 CLINICAL DATA:  Stroke suspected. EXAM: CT HEAD WITHOUT CONTRAST TECHNIQUE: Contiguous axial images were obtained from the base of the skull through the vertex without intravenous contrast. COMPARISON:  None. FINDINGS: Brain: No evidence of acute large vascular territory infarction, hemorrhage, hydrocephalus, extra-axial collection or mass lesion/mass effect. Vascular: Calcific atherosclerosis. Skull: No acute fracture. Sinuses/Orbits: Inferior left maxillary sinus mucosal thickening. Unremarkable orbits. Other: No mastoid effusions. IMPRESSION: No evidence of acute intracranial abnormality. Electronically Signed   By: Frederick S Jones MD   On: 04/16/2020 10:58   ECHOCARDIOGRAM  COMPLETE  Result Date: 04/16/2020    ECHOCARDIOGRAM REPORT   Patient Name:   Lisa Poole Date of Exam: 04/16/2020 Medical Rec #:  [id number]      Height:       60.0 in Accession #:    [id number]     Weight:       143.0 lb Date of Birth:  02/03/1966       BSA:          1.619 m Patient Age:    55 years       BP:           99/81 mmHg Patient Gender: F              HR:           106 bpm. Exam Location:  Inpatient Procedure: 2D Echo, Color Doppler and Cardiac Doppler Indications:    Congestive Heart Failure 428.0 / I50.9  History:        Patient has prior history of Echocardiogram examinations, most                 recent 01/30/2020. Defibrillator, Arrythmias:Complete heart                 block; Signs/Symptoms:Dyspnea, Chest Pain and Hypotension.                 Cardiac Sarcoid.  Sonographer:    Tiffany Cooper RDCS Referring Phys: [id number] WILLIAM A HENSEL IMPRESSIONS  1. There is no left ventricular thrombus. There is global left ventricular hypokinesis, but the mid septum appears dyskinetic. There is significant left ventricular dyssynchrony (especially apex to base). Left ventricular ejection fraction, by estimation, is 25 to 30%. The left ventricle has severely decreased function. The left ventricle demonstrates regional wall motion abnormalities (see scoring diagram/findings for description). Indeterminate diastolic filling due to E-A fusion.  2. Right ventricular systolic function is normal. The right ventricular size is normal. There is normal pulmonary artery systolic pressure.  3. Left atrial size was mildly dilated.  4. The mitral valve is normal in structure. Moderate to severe mitral valve regurgitation.  5. The aortic valve is tricuspid. Aortic valve regurgitation is mild to moderate. Mild aortic valve sclerosis is present, with no evidence of aortic valve stenosis.  6. The inferior vena cava is normal in size with greater than 50% respiratory variability, suggesting right atrial pressure of 3 mmHg.  Comparison(s): Prior images reviewed side by side. The left ventricular function is worsened. FINDINGS  Left Ventricle: There is no left ventricular thrombus. There is global left ventricular hypokinesis, but the mid septum appears dyskinetic. There is significant left ventricular dyssynchrony (especially apex to base). Left ventricular ejection fraction,  by estimation, is   25 to 30%. The left ventricle has severely decreased function. The left ventricle demonstrates regional wall motion abnormalities. The left ventricular internal cavity size was normal in size. There is no left ventricular hypertrophy. Abnormal (paradoxical) septal motion, consistent with RV pacemaker. Indeterminate diastolic filling due to E-A fusion. Right Ventricle: The right ventricular size is normal. No increase in right ventricular wall thickness. Right ventricular systolic function is normal. There is normal pulmonary artery systolic pressure. The tricuspid regurgitant velocity is 2.50 m/s, and  with an assumed right atrial pressure of 3 mmHg, the estimated right ventricular systolic pressure is 28.0 mmHg. Left Atrium: Left atrial size was mildly dilated. Right Atrium: Right atrial size was normal in size. Pericardium: There is no evidence of pericardial effusion. Mitral Valve: The mitral valve is normal in structure. Moderate to severe mitral valve regurgitation. Tricuspid Valve: The tricuspid valve is normal in structure. Tricuspid valve regurgitation is mild. Aortic Valve: The aortic valve is tricuspid. Aortic valve regurgitation is mild to moderate. Mild aortic valve sclerosis is present, with no evidence of aortic valve stenosis. Aortic valve mean gradient measures 6.0 mmHg. Aortic valve peak gradient measures 10.8 mmHg. Aortic valve area, by VTI measures 1.52 cm. Pulmonic Valve: The pulmonic valve was normal in structure. Pulmonic valve regurgitation is not visualized. Aorta: The aortic root and ascending aorta are structurally  normal, with no evidence of dilitation. Venous: The inferior vena cava is normal in size with greater than 50% respiratory variability, suggesting right atrial pressure of 3 mmHg. IAS/Shunts: No atrial level shunt detected by color flow Doppler. Additional Comments: A pacer wire is visualized.  LEFT VENTRICLE PLAX 2D LVIDd:         5.20 cm LV PW:         0.60 cm LV IVS:        0.70 cm LVOT diam:     1.80 cm LV SV:         28 LV SV Index:   17 LVOT Area:     2.54 cm  LV Volumes (MOD) LV vol d, MOD A2C: 146.0 ml LV vol d, MOD A4C: 149.0 ml LV vol s, MOD A2C: 79.9 ml LV vol s, MOD A4C: 127.0 ml LV SV MOD A2C:     66.1 ml LV SV MOD A4C:     149.0 ml LV SV MOD BP:      45.5 ml LEFT ATRIUM             Index LA Vol (A2C):   21.5 ml 13.28 ml/m LA Vol (A4C):   20.9 ml 12.91 ml/m LA Biplane Vol: 21.5 ml 13.28 ml/m  AORTIC VALVE AV Area (Vmax):    1.60 cm AV Area (Vmean):   1.74 cm AV Area (VTI):     1.52 cm AV Vmax:           164.00 cm/s AV Vmean:          111.000 cm/s AV VTI:            0.183 m AV Peak Grad:      10.8 mmHg AV Mean Grad:      6.0 mmHg LVOT Vmax:         103.00 cm/s LVOT Vmean:        75.700 cm/s LVOT VTI:          0.109 m LVOT/AV VTI ratio: 0.60  AORTA Ao Root diam: 2.90 cm Ao Asc diam:  3.30 cm TRICUSPID VALVE TR Peak grad:     25.0 mmHg TR Vmax:        250.00 cm/s  SHUNTS Systemic VTI:  0.11 m Systemic Diam: 1.80 cm Mihai Croitoru MD Electronically signed by Mihai Croitoru MD Signature Date/Time: 04/16/2020/2:12:40 PM    Final      Medications:     Scheduled Medications: . acetaminophen  650 mg Oral Q6H   Or  . acetaminophen  650 mg Rectal Q6H  . amiodarone  200 mg Oral Daily  . cholecalciferol  1,000 Units Oral Daily  . folic acid  1 mg Oral Daily  . furosemide  20 mg Oral Daily  . gabapentin  600 mg Oral BID  . insulin aspart  0-15 Units Subcutaneous TID WC  . insulin aspart  0-5 Units Subcutaneous QHS  . methotrexate  25 mg Oral Q Wed  . mexiletine  150 mg Oral Q12H  . midodrine   5 mg Oral TID WC  . multivitamin with minerals  1 tablet Oral Daily  . nystatin  5 mL Oral QID  . pantoprazole  80 mg Oral Daily  . polyethylene glycol  17 g Oral Daily  . potassium chloride  20 mEq Oral Daily  . pravastatin  10 mg Oral q1800  . predniSONE  60 mg Oral Q breakfast  . sulfamethoxazole-trimethoprim  1 tablet Oral Once per day on Mon Wed Fri  . venlafaxine XR  150 mg Oral Q breakfast    Infusions: . sodium chloride Stopped (04/25/2020 1455)    PRN Medications: albuterol, oxyCODONE **OR** oxyCODONE    Assessment/Plan   1. Dyspnea--> Septic Emboi  -HS Trop > 4000 -CTA-->Multiple  Nodules ? Septic emboli? Methotrexate lung toxicity.    - Concern for device involvement. Will need TEE + biopsy later today.  - ID consulted  - Blood Cx obtained- NGTD  - Off antibiotics per ID.   2. Chest Pain  -Elevated HS Trop >4000  -Sed Rate 73.  3. Hypotension  -Check blood cultures x2. - Stable today.   -WBC 6.2   4. A/C Systolic Heart Failure 11/2019 LHC with no significant coronaries.  -Cardiac PET in 8/21 was showed active inflammation in the heart, concerning again for cardiac sarcoidosis Repeat PET in 11/21 showed ongoing inflammation but improved.Most recent echo in11/21 with EF35%, but cMRI in 11/21 showed LV EF 27%. - Volume status stable. Continue lasix 20 mg po daily.  Supp K.  -Controlled midodrine 5 mg three times a day.  -Hold HF meds with hypotension.   4. Cardiac Sarcoid -Cardiac Pet and CMRI  Both suggestive of cardiac sarcoid. Sent to [facility] 03/13/20 for possible biopsy and transplant.  Cardiac MRI and cardiac PET both suggestive of cardiac sarcoidosis, and unexplained CHB also concerning, as is VT. She has had VT with prednisone weaning. Suspect isolated cardiac sarcoidosis. Transbronchial biopsy did not show granulomatous disease. Seen at [facility], they agree with diagnosis. Repeat cardiac PET in 11/21 showed cardiac inflammation still present but  improved. -Continue prednisone 60 mg daily - Continue methotrexate once a week, she is on goal dose 25 mg weekly , Wednesday.  Will need to follow CBC, LFTsmonthly.  - On home Bactrim for PJP prophylaxis and folate.Follow K and creatinine closely.  - She is now on infliximab per rheumatology at [facility]. She has had 1 infusion  . 5. H/O VT  Has had multiple morphologies  Interrogate ICD . ATP several different times on 11/23/21and 04/05/20  7000 episodes NSVT on 05/04/2020  Keep K > 4 andd Mag >2  -   Mag 1.5 on admit. Given 4 grams Mag. Mag up to 2.3   -Continue amio 200 mg twice a day  + mexiletine 150 mg twice a day   -EP aware of admit   6. H/O CHB Has CRT  7. Thrush Need to continue diflucan.   8. H/O RIJ DVT Off eliquis and on heparin drip for procedures.  Currently on hold.   9 DMII  Palliative Care consulted  For [place].   Length of Stay: [place], NP  2020/05/02, 8:17 AM  Advanced Heart Failure Team Pager [phone] (M-F; 7a - 4p)  Please contact [facility] for night-coverage after hours (4p -7a ) and weekends on [email or web address]  Patient seen with NP, agree with the above note.   Still with pleuritic chest pain, no change. Afebrile, cultures so far negative.   General: NAD Neck: No JVD, no thyromegaly or thyroid nodule.  Lungs: Clear to auscultation bilaterally with normal respiratory effort. CV: Nondisplaced PMI.  Heart regular S1/S2, no S3/S4, she sounds like she has a friction rub.  No peripheral edema.   Abdomen: Soft, nontender, no hepatosplenomegaly, no distention.  Skin: Intact without lesions or rashes.  Neurologic: Alert and oriented x 3.  Psych: Normal affect. Extremities: No clubbing or cyanosis.  HEENT: Normal.   1. Pulmonary: Pleuritic chest pain with bilateral innumerable new nodules with cavitation, more generalized consolidation left lung apex.  Concern for septic emboli versus fungal disease, probably less likely methotrexate lung toxicity  (has been immunosuppressed).  Blood cultures negative so far and afebrile.  ESR elevated at 73.  - Per ID, antibiotics on hold until after bronch/biopsy.  - Bronch/biopsy today.   - TEE today to look for vegetation.  She has a device in place.  2. Headache: CT head with no acute findings.  3. Cardiac sarcoidosis: She is followed in the sarcoidosis clinic at [facility].  Cardiac MRI and cardiac PET both suggestive of cardiac sarcoidosis, and unexplained CHB also concerning, as is VT. She has had VT with prednisone weaning. Suspect isolated cardiac sarcoidosis. Transbronchial biopsy did not show granulomatous disease. Seen at [facility], they agree with diagnosis. Repeat cardiac PET in 11/21 showed cardiac inflammation still present but improved. I do not think that CT findings this admit would be likely to be disseminated sarcoidosis given no evident lung activity prior and consistent immunosuppression.  -Continue prednisone 60 mg daily home dose for now.  - Continue methotrexate once a week, she is on goal dose 25 mg weekly => discussed with pulmonary, unlikely to be MTX lung toxicity. - She is on Bactrim for PJP prophylaxis and folate. - She is now on infliximab per rheumatology at [facility], has had 1 infusion. 4. Recurrent VT: Recurrent ventricular tachycardia:She has hadVT with multiple morphologies. No coronary disease on recent cath. Suspect as abovethat she has cardiac sarcoidosis (would also explain CHB). Amiodarone stopped in the past due to torsades and very long QTc on amiodarone. She has tolerated restart of amiodarone. She is also on prednisone as above and has had increased VT when prednisone was tapered.This admission, device interrogation has shown NSVT but not sustained VT.  -She will continue amiodarone 200 mg daily. -Off Coreg with soft BP.  - Continue mexiletine 150 mg bid, can increase if needed. 5. H/o MPN:TIRWERX due to cardiac sarcoidosis. Now s/p CRT.  6. Rt IJ DVT:  likely from prior central line. Planned for 3 months Eliquis, had been planned to stop at the end of this month.  -  Currently on heparin gtt.  7. Chronic systolic CHF: Chronicsystolic CHF: Nonischemic cardiomyopathy. Echoin 7/21with EF newly decreased to 20-25% with septal-lateral dyssynchrony and septal severe hypokinesis, normal RV, moderate TR, moderate MR. Echo in 5/20 prior to PPM placement showed EF 60-65%. She developed shock in 7/21 requiring pressors/inotropes and IABP.LHC/RHC 7/23/21with no significant CAD, elevated filling pressures, and low cardiac output. cMRI 7/27/21and repeated 11/21showed patchy LGE in the basal septum, basal-mid inferoseptum/inferior wall, basal anterior wall, mid anterolateral wall. Cardiac MRI LGE pattern could be consistent with cardiac sarcoidosis (would explain earlier CHB). Alternatively, prior viral myocarditis (recent COVID-19). CT chest with ground glass lower lobes but no evidence for pulmonary sarcoidosis, transbronchial biopsy did not show granulomatous disease. She is now s/p device upgrade to MDT CRT-D.Cardiac PET in 8/21 was showed active inflammation in the heart, concerning again for cardiac sarcoidosis.Repeat PET in 11/21 showed ongoing inflammation but improved.CMRI in 11/21 showed LV EF 27%.Echo this admission with EF 25-30%, normal RV, moderate-severe MR.  NYHA class IIIb with prominent fatigue.  She does not look volume overloaded on exam today.  -She is off spironolactone, Entresto, and Coreg with soft BP.  She is now on midodrine 5 mg tid.  -Continue Lasix 20 mg daily.   - ?low output with prominent fatigue.  She will need eventual RHC.  -Will need TEE for evaluation of for endocarditis/device infection => plan today, discussed risks/benefits with patient and she agrees to procedure.  8. Chest pain: With elevated HS-TnI in the 4000s range without trend.  Pain is pleuritic and likely due to lung pathology.  No PE on CTA.  She had  normal coronaries in 7/21, doubt ACS. However, listening to her heart this morning I am concerned for a pericardial friction rub.  There was not a pericardial effusion on her echo yesterday.  - Will start colchicine 0.6 mg daily for now and follow.  Already on prednisone.  Giannie Soliday 04/25/2020 9:29 AM   

## 2020-05-11 NOTE — Progress Notes (Signed)
   Called due to Code [facility]  Patient with severe systolic HF due to cardiac sarcoid with multiple recent admissions. Readmit with diffuse pulmonary nodules felt to be septic emboli. Had bronch and TEE today without any evidence of endocarditis.   This evening became acutely unresponsive. Developed PEA arrest. CCM and code team immediately on scene. Intubated and had CPR for > 10 mins with multiple rounds of drugs include epi, calcium x, bicarb and insulin/D50. Arrested again and had CPR for ~ 10 mins with ROSC. Now on NE 50, epi 40. VP 0.03 with SBP 50-70.   Pupils now fixed and dilated and concern for possible brain hemorrhage vs severe anoxic injury.   Husband notified and on way. She has an unsurvivable condition unfortunately.   CCT 60 mins.   Glori Bickers, MD  10:17 PM

## 2020-05-11 NOTE — Progress Notes (Signed)
OT Cancellation Note  Patient Details Name: Lisa Poole MRN: [id number] DOB: September 03, 1965   Cancelled Treatment:    Reason Eval/Treat Not Completed: Patient at procedure or test/ unavailable  Malka So 04/16/2020, 10:22 AM  Nestor Lewandowsky, OTR/L Acute Rehabilitation Services Pager: [phone] Office: [phone]

## 2020-05-11 NOTE — Anesthesia Preprocedure Evaluation (Addendum)
Anesthesia Evaluation  Patient identified by MRN, date of birth, ID band Patient awake    Reviewed: Allergy & Precautions, NPO status , Patient's Chart, lab work & pertinent test results  Airway Mallampati: II  TM Distance: >3 FB Neck ROM: Full    Dental no notable dental hx. (+) Teeth Intact, Dental Advisory Given   Pulmonary shortness of breath,    Pulmonary exam normal breath sounds clear to auscultation       Cardiovascular hypertension, + Past MI  Normal cardiovascular exam+ dysrhythmias Atrial Fibrillation + pacemaker + Cardiac Defibrillator + Valvular Problems/Murmurs MR  Rhythm:Regular Rate:Normal  04/16/20 Echo  1. There is no left ventricular thrombus. There is global left  ventricular hypokinesis, but the mid septum appears dyskinetic. There is  significant left ventricular dyssynchrony (especially apex to base). Left  ventricular ejection fraction, by  estimation, is 25 to 30%. The left ventricle has severely decreased  function. The left ventricle demonstrates regional wall motion  abnormalities (see scoring diagram/findings for description).  Indeterminate diastolic filling due to E-A fusion.  2. Right ventricular systolic function is normal. The right ventricular  size is normal. There is normal pulmonary artery systolic pressure.  3. Left atrial size was mildly dilated.  4. The mitral valve is normal in structure. Moderate to severe mitral  valve regurgitation.    Neuro/Psych negative neurological ROS  negative psych ROS   GI/Hepatic negative GI ROS, Neg liver ROS,   Endo/Other  diabetes  Renal/GU      Musculoskeletal  (+) Arthritis ,   Abdominal   Peds  Hematology   Anesthesia Other Findings Pt w chest pain r/o for STEMI Dr Algernon Huxley wishes to proceed  Reproductive/Obstetrics                           Anesthesia Physical Anesthesia Plan  ASA: IV and  emergent  Anesthesia Plan: General   Post-op Pain Management:    Induction:   PONV Risk Score and Plan: Treatment may vary due to age or medical condition, Ondansetron and Dexamethasone  Airway Management Planned: Oral ETT  Additional Equipment: Arterial line and CVP  Intra-op Plan:   Post-operative Plan: Extubation in OR  Informed Consent: I have reviewed the patients History and Physical, chart, labs and discussed the procedure including the risks, benefits and alternatives for the proposed anesthesia with the patient or authorized representative who has indicated his/her understanding and acceptance.     Dental advisory given  Plan Discussed with: CRNA and Anesthesiologist  Anesthesia Plan Comments: (Pt w Chest pain in Preop. Dr Aundra Dubin saw wishes to proceed . Discussed w Dr Lamonte Sakai at Bedside. Will start aline and R IJ CVP for case . Also norepi drip requested.)      Anesthesia Quick Evaluation

## 2020-05-11 NOTE — Progress Notes (Signed)
Pt extubated by Dr. Haroldine Laws per family request, comfort care / DNR protocol. RT notified.

## 2020-05-11 NOTE — Progress Notes (Signed)
eLink Physician-Brief Progress Note Patient Name: Lisa Poole DOB: 06-06-1965 MRN: [id number]   Date of Service  08-May-2020  HPI/Events of Note  Patient is NPO for a bronchoscopy this morning , and needs something for pain, she usually takes oral pain medications but cannot do so due to NPO status.  eICU Interventions  Dilaudid  0.5 mg iv x 1 ordered.        Kerry Kass Scotlynn Noyes 05/08/2020, 6:39 AM

## 2020-05-11 NOTE — Anesthesia Procedure Notes (Signed)
Procedure Name: Intubation Date/Time: 27-Apr-2020 11:42 AM Performed by: Gwyndolyn Saxon, CRNA Pre-anesthesia Checklist: Patient identified, Emergency Drugs available, Suction available and Patient being monitored Patient Re-evaluated:Patient Re-evaluated prior to induction Oxygen Delivery Method: Circle system utilized Preoxygenation: Pre-oxygenation with 100% oxygen Induction Type: IV induction Ventilation: Mask ventilation without difficulty Laryngoscope Size: Miller and 2 Grade View: Grade I Tube type: Oral Tube size: 8.5 mm Number of attempts: 1 Airway Equipment and Method: Stylet Placement Confirmation: ETT inserted through vocal cords under direct vision,  positive ETCO2 and breath sounds checked- equal and bilateral Secured at: 21 cm Tube secured with: Tape Dental Injury: Teeth and Oropharynx as per pre-operative assessment

## 2020-05-11 NOTE — Progress Notes (Signed)
PCCM INTERVAL PROGRESS NOTE  Dr. Haroldine Laws and myself have discussed goal of care with family. At this time they understand her condition is non-survivable. They have requested we keep her comfortable and discontinue life support  Plan:  DNR Morphine infusions Extubate once comfort achieved and appropriate family at bedside.     Georgann Housekeeper, AGACNP-BC [place]  See Amion for personal pager PCCM on call pager [phone]  2020-05-12 11:51 PM

## 2020-05-11 NOTE — Progress Notes (Signed)
Family Medicine Teaching Service Daily Progress Note Intern Pager: [phone]  Patient name: Lisa Poole Medical record number: [id number] Date of birth: [birth year] Age: 55 y.o. Gender: female  Primary Care Provider: Milford Cage, PA Consultants: Cardiology, infectious disease, pulmonology Code Status: Full code  Pt Overview and Major Events to Date:  Admitted 12/6  Assessment and Plan: Lisa Huether Johnsonis a 55 y.o.femalewho presented with chest pain and dyspnea x 1 weekand was admitted forfurther evaluation and management of multiple pulmonary nodules concerning for septic emboli.Past medical history is s/f multiple cardiac pathologies: suspected cardiac sarcoidosis, HFrEF (27% EF), ICD, ventriclar tacchyarrhytmia, as well as diabetes, asthma, HTN.  Dyspnea 2/2 pulmonary nodules of unknown etiology Patient reports feeling slightly improved this morning.  Continues to have some pain with deep breaths though it is improved.  Continues to have difficulty with movement secondary to pain.  Rates pain as 3 out of 10.  Discussed with patient to let us know if pain increases as we may need to try different pain regimen. Cryptococcal antigen negative. Patient scheduled for ENB and bronc/BAL this morning. Will likely proceed with a TEE to rule out infective endocarditis while she is under general anesthesia.  Plan to send cultures, fungal and AFB along with biopsy. -Appreciate pulmonology care and recommendations -Appreciate infectious disease care and recommendations -Follow-up procedures, cultures, pathology -Follow-up Fungitell -Follow-up Blastomyces antigen -Follow-up urine histoplasma antigen -Continue incentive spirometry -Continue pain control with:  Tylenol 650 mg every 6 hours  Oxy IR 5 mg every 4 hours as needed for moderate pain  OxyIR 10 mg every 4 hours as needed for severe pain  Gabapentin 600 mg twice daily -Oxygen supplementation as needed to keep saturation greater than  92%  HFrEF: Chronic, stable Echo yesterday with EF estimated between 25 to 30%, global left ventricular hypokinesis, significant left ventricular dyssynchrony, left ventricular regional wall motion abnormalities.  Plan for TEE this morning. -Heart failure following, appreciate care and recommendations -Continue midodrine 5 mg 3 times daily  Cardiac sarcoidosis TEE today. -Continue prednisone 60 mg daily, methotrexate 25 mg every Wednesday -Continue folic acid and vitamin D supplementation -Protonix for GI prophylaxis  Steroid-induced diabetes: chronic, stable CBGs elevated overnight [phone] in the setting of steroids.  Long-acting insulin held due to patient being NPO for morning procedure. A1c on 12/7 of 8.6, improved from her last A1c on 9/28 of 10.2. -CBGs before meals and nightly -Holding insulin while n.p.o.  History of ventricular tachycardia status post MTD CRT-D 02/2019 Potassium 4.5, magnesium 2.3. Home medications amiodarone, mexiletine twice daily. - Continue on amiodarone and mexiletine 150 mg BID -Keep potassium > 4, magnesium > 2  Paroxysmal atrial fibrillation: chronic, stable On apixaban.  Currently on heparin, though subtherapeutic.  aPTT elevated at 133, unfractionated heparin 1.98. -Appreciate Pharmacy help with Heparin   Oral thrush secondary to chronic prednisone use Takes fluconazone in bursts as needed at home.  - Continue Nystatin - Hold Fluconazole due to potential for QT prolongation   Arthritis: Chronic, stable Home medications include hydrocodone acetaminophen 10-325 mg. -Acetaminophen as needed mild pain -Oxycodone 5 mg every 4 hours as needed for moderate pain, 10 mg every 4 hours as needed for severe pain -PT/OT  FEN/GI: NPO. for procedures, will resume diet afterwards PPx: Heparin GTT   Status is: Inpatient  Remains inpatient appropriate because:Ongoing diagnostic testing needed not appropriate for outpatient work up and Inpatient  level of care appropriate due to severity of illness   Dispo:  Patient From:  Home  Planned Disposition: To be determined  Expected discharge date: 04/24/20  Medically stable for discharge: No    Subjective:  Patient reports that her pain is improved today.  She rates it a 3 out of 10.  It is predominantly more in her back today.  She still has pain with movement and with inspiration.  She tells me that she is having a procedure soon.  Objective: Temp:  [97.8 F (36.6 C)-99.2 F (37.3 C)] 99.2 F (37.3 C) (12/08 0350) Pulse Rate:  [82-142] 97 (12/08 0350) Resp:  [14-33] 14 (12/08 0350) BP: (82-126)/(37-89) 126/80 (12/08 0350) SpO2:  [84 %-100 %] 93 % (12/08 0350) Weight:  [70.7 kg-70.9 kg] 70.9 kg (12/08 0350) Physical Exam: General: Mild distress, moving minimally 2/2 discomfort Cardiovascular: Slight friction rub appreciated, irregularly irregular Respiratory: fine crackles in bilateral bases, speaking in full sentences, no accessory muscle use but with some notable discomfort with breathing Extremities: b/l lower extremities cold to touch from knees downward, 2+ DP pulses b/l, upper extremities warm but with weaker radial pulses  Laboratory: Recent Labs  Lab 04/11/2020 1337 04/16/20 0412 05-02-20 0034  WBC 6.5 7.0 6.2  HGB 12.8 12.0 12.6  HCT 39.0 37.7 36.8  PLT 215 153 151   Recent Labs  Lab 04/16/20 0412 04/16/20 1908 05/02/20 0034  NA 134* 127* 131*  K 3.4* 5.3* 4.5  CL 99 96* 98  CO2 20* 19* 23  BUN 19 25* 22*  CREATININE 0.59 0.79 0.71  CALCIUM 8.4* 8.5* 8.8*  PROT 5.6*  --  5.6*  BILITOT 1.0  --  0.6  ALKPHOS 45  --  71  ALT 36  --  57*  AST 67*  --  117*  GLUCOSE 84 465* 361*    Imaging/Diagnostic Tests: Echo 12/7 IMPRESSIONS  1. There is no left ventricular thrombus. There is global left  ventricular hypokinesis, but the mid septum appears dyskinetic. There is  significant left ventricular dyssynchrony (especially apex to base). Left   ventricular ejection fraction, by  estimation, is 25 to 30%. The left ventricle has severely decreased  function. The left ventricle demonstrates regional wall motion  abnormalities (see scoring diagram/findings for description).  Indeterminate diastolic filling due to E-A fusion.  2. Right ventricular systolic function is normal. The right ventricular  size is normal. There is normal pulmonary artery systolic pressure.  3. Left atrial size was mildly dilated.  4. The mitral valve is normal in structure. Moderate to severe mitral  valve regurgitation.  5. The aortic valve is tricuspid. Aortic valve regurgitation is mild to  moderate. Mild aortic valve sclerosis is present, with no evidence of  aortic valve stenosis.  6. The inferior vena cava is normal in size with greater than 50%  respiratory variability, suggesting right atrial pressure of 3 mmHg.   Sharion Settler, DO 2020-05-02, 5:34 AM PGY-1, [place] Intern pager: [phone], text pages welcome

## 2020-05-11 NOTE — H&P (View-Only) (Signed)
Advanced Heart Failure Rounding Note  PCP-Cardiologist: No primary care provider on file.   Subjective:    04/16/2020- CTA with septic emboli. ID consulted. Antibiotics stopped.    Bld Cx pending CTA concerning for septic emboli.  Denies SOB. Complaining of chest soreness.   Objective:   Weight Range: 70.9 kg Body mass index is 30.53 kg/m.   Vital Signs:   Temp:  [97.8 F (36.6 C)-99.5 F (37.5 C)] 99.5 F (37.5 C) (12/08 0748) Pulse Rate:  [82-142] 111 (12/08 0748) Resp:  [14-33] 20 (12/08 0748) BP: (87-126)/(37-89) 104/85 (12/08 0748) SpO2:  [84 %-100 %] 93 % (12/08 0748) Weight:  [70.7 kg-70.9 kg] 70.9 kg (12/08 0350) Last BM Date: 04/16/20  Weight change: Filed Weights   04/13/2020 1442 May 11, 2020 0043 05/11/20 0350  Weight: 64.9 kg 70.7 kg 70.9 kg    Intake/Output:   Intake/Output Summary (Last 24 hours) at 2020/05/11 0817 Last data filed at 05/11/20 0300 Gross per 24 hour  Intake 282.05 ml  Output 550 ml  Net -267.95 ml      Physical Exam    General:  Appears weak. No resp difficulty HEENT: normal Neck: supple. no JVD. Carotids 2+ bilat; no bruits. No lymphadenopathy or thryomegaly appreciated. Cor: PMI nondisplaced. Irregular rate & rhythm. No rubs, gallops or murmurs. Lungs: Crackles throughout on 2 liters.  Abdomen: soft, nontender, nondistended. No hepatosplenomegaly. No bruits or masses. Good bowel sounds. Extremities: no cyanosis, clubbing, rash, edema Neuro: alert & orientedx3, cranial nerves grossly intact. moves all 4 extremities w/o difficulty. Affect pleasant   Telemetry   SR 90s with frequent PVCs NSVT.   EKG    N/A  Labs    CBC Recent Labs    04/16/20 0412 05/11/2020 0034  WBC 7.0 6.2  NEUTROABS 6.5  --   HGB 12.0 12.6  HCT 37.7 36.8  MCV 103.3* 99.5  PLT 153 528   Basic Metabolic Panel Recent Labs    04/16/20 0412 04/16/20 0412 04/16/20 1908 05-11-20 0034  NA 134*   < > 127* 131*  K 3.4*   < > 5.3* 4.5  CL 99    < > 96* 98  CO2 20*   < > 19* 23  GLUCOSE 84   < > 465* 361*  BUN 19   < > 25* 22*  CREATININE 0.59   < > 0.79 0.71  CALCIUM 8.4*   < > 8.5* 8.8*  MG 2.5*   < > 2.2 2.3  PHOS 3.8  --   --   --    < > = values in this interval not displayed.   Liver Function Tests Recent Labs    04/16/20 0412 2020-05-11 0034  AST 67* 117*  ALT 36 57*  ALKPHOS 45 71  BILITOT 1.0 0.6  PROT 5.6* 5.6*  ALBUMIN 2.6* 2.4*   No results for input(s): LIPASE, AMYLASE in the last 72 hours. Cardiac Enzymes No results for input(s): CKTOTAL, CKMB, CKMBINDEX, TROPONINI in the last 72 hours.  BNP: BNP (last 3 results) Recent Labs    11/26/19 2011 02/06/20 0725 05/10/2020 1350  BNP 1,258.1* 344.3* 492.8*    ProBNP (last 3 results) No results for input(s): PROBNP in the last 8760 hours.   D-Dimer No results for input(s): DDIMER in the last 72 hours. Hemoglobin A1C Recent Labs    04/16/20 0412  HGBA1C 8.6*   Fasting Lipid Panel No results for input(s): CHOL, HDL, LDLCALC, TRIG, CHOLHDL, LDLDIRECT in the last 72 hours.  Thyroid Function Tests No results for input(s): TSH, T4TOTAL, T3FREE, THYROIDAB in the last 72 hours.  Invalid input(s): FREET3  Other results:   Imaging    CT HEAD WO CONTRAST  Result Date: 04/16/2020 CLINICAL DATA:  Stroke suspected. EXAM: CT HEAD WITHOUT CONTRAST TECHNIQUE: Contiguous axial images were obtained from the base of the skull through the vertex without intravenous contrast. COMPARISON:  None. FINDINGS: Brain: No evidence of acute large vascular territory infarction, hemorrhage, hydrocephalus, extra-axial collection or mass lesion/mass effect. Vascular: Calcific atherosclerosis. Skull: No acute fracture. Sinuses/Orbits: Inferior left maxillary sinus mucosal thickening. Unremarkable orbits. Other: No mastoid effusions. IMPRESSION: No evidence of acute intracranial abnormality. Electronically Signed   By: Frederick S Jones MD   On: 04/16/2020 10:58   ECHOCARDIOGRAM  COMPLETE  Result Date: 04/16/2020    ECHOCARDIOGRAM REPORT   Patient Name:   Laiah A Doten Date of Exam: 04/16/2020 Medical Rec #:  [id number]      Height:       60.0 in Accession #:    [id number]     Weight:       143.0 lb Date of Birth:  07/02/1965       BSA:          1.619 m Patient Age:    55 years       BP:           99/81 mmHg Patient Gender: F              HR:           106 bpm. Exam Location:  Inpatient Procedure: 2D Echo, Color Doppler and Cardiac Doppler Indications:    Congestive Heart Failure 428.0 / I50.9  History:        Patient has prior history of Echocardiogram examinations, most                 recent 01/30/2020. Defibrillator, Arrythmias:Complete heart                 block; Signs/Symptoms:Dyspnea, Chest Pain and Hypotension.                 Cardiac Sarcoid.  Sonographer:    Tiffany Cooper RDCS Referring Phys: [id number] WILLIAM A HENSEL IMPRESSIONS  1. There is no left ventricular thrombus. There is global left ventricular hypokinesis, but the mid septum appears dyskinetic. There is significant left ventricular dyssynchrony (especially apex to base). Left ventricular ejection fraction, by estimation, is 25 to 30%. The left ventricle has severely decreased function. The left ventricle demonstrates regional wall motion abnormalities (see scoring diagram/findings for description). Indeterminate diastolic filling due to E-A fusion.  2. Right ventricular systolic function is normal. The right ventricular size is normal. There is normal pulmonary artery systolic pressure.  3. Left atrial size was mildly dilated.  4. The mitral valve is normal in structure. Moderate to severe mitral valve regurgitation.  5. The aortic valve is tricuspid. Aortic valve regurgitation is mild to moderate. Mild aortic valve sclerosis is present, with no evidence of aortic valve stenosis.  6. The inferior vena cava is normal in size with greater than 50% respiratory variability, suggesting right atrial pressure of 3 mmHg.  Comparison(s): Prior images reviewed side by side. The left ventricular function is worsened. FINDINGS  Left Ventricle: There is no left ventricular thrombus. There is global left ventricular hypokinesis, but the mid septum appears dyskinetic. There is significant left ventricular dyssynchrony (especially apex to base). Left ventricular ejection fraction,  by estimation, is   25 to 30%. The left ventricle has severely decreased function. The left ventricle demonstrates regional wall motion abnormalities. The left ventricular internal cavity size was normal in size. There is no left ventricular hypertrophy. Abnormal (paradoxical) septal motion, consistent with RV pacemaker. Indeterminate diastolic filling due to E-A fusion. Right Ventricle: The right ventricular size is normal. No increase in right ventricular wall thickness. Right ventricular systolic function is normal. There is normal pulmonary artery systolic pressure. The tricuspid regurgitant velocity is 2.50 m/s, and  with an assumed right atrial pressure of 3 mmHg, the estimated right ventricular systolic pressure is 63.0 mmHg. Left Atrium: Left atrial size was mildly dilated. Right Atrium: Right atrial size was normal in size. Pericardium: There is no evidence of pericardial effusion. Mitral Valve: The mitral valve is normal in structure. Moderate to severe mitral valve regurgitation. Tricuspid Valve: The tricuspid valve is normal in structure. Tricuspid valve regurgitation is mild. Aortic Valve: The aortic valve is tricuspid. Aortic valve regurgitation is mild to moderate. Mild aortic valve sclerosis is present, with no evidence of aortic valve stenosis. Aortic valve mean gradient measures 6.0 mmHg. Aortic valve peak gradient measures 10.8 mmHg. Aortic valve area, by VTI measures 1.52 cm. Pulmonic Valve: The pulmonic valve was normal in structure. Pulmonic valve regurgitation is not visualized. Aorta: The aortic root and ascending aorta are structurally  normal, with no evidence of dilitation. Venous: The inferior vena cava is normal in size with greater than 50% respiratory variability, suggesting right atrial pressure of 3 mmHg. IAS/Shunts: No atrial level shunt detected by color flow Doppler. Additional Comments: A pacer wire is visualized.  LEFT VENTRICLE PLAX 2D LVIDd:         5.20 cm LV PW:         0.60 cm LV IVS:        0.70 cm LVOT diam:     1.80 cm LV SV:         28 LV SV Index:   17 LVOT Area:     2.54 cm  LV Volumes (MOD) LV vol d, MOD A2C: 146.0 ml LV vol d, MOD A4C: 149.0 ml LV vol s, MOD A2C: 79.9 ml LV vol s, MOD A4C: 127.0 ml LV SV MOD A2C:     66.1 ml LV SV MOD A4C:     149.0 ml LV SV MOD BP:      45.5 ml LEFT ATRIUM             Index LA Vol (A2C):   21.5 ml 13.28 ml/m LA Vol (A4C):   20.9 ml 12.91 ml/m LA Biplane Vol: 21.5 ml 13.28 ml/m  AORTIC VALVE AV Area (Vmax):    1.60 cm AV Area (Vmean):   1.74 cm AV Area (VTI):     1.52 cm AV Vmax:           164.00 cm/s AV Vmean:          111.000 cm/s AV VTI:            0.183 m AV Peak Grad:      10.8 mmHg AV Mean Grad:      6.0 mmHg LVOT Vmax:         103.00 cm/s LVOT Vmean:        75.700 cm/s LVOT VTI:          0.109 m LVOT/AV VTI ratio: 0.60  AORTA Ao Root diam: 2.90 cm Ao Asc diam:  3.30 cm TRICUSPID VALVE TR Peak grad:  25.0 mmHg TR Vmax:        250.00 cm/s  SHUNTS Systemic VTI:  0.11 m Systemic Diam: 1.80 cm Mihai Croitoru MD Electronically signed by Sanda Klein MD Signature Date/Time: 04/16/2020/2:12:40 PM    Final      Medications:     Scheduled Medications: . acetaminophen  650 mg Oral Q6H   Or  . acetaminophen  650 mg Rectal Q6H  . amiodarone  200 mg Oral Daily  . cholecalciferol  1,000 Units Oral Daily  . folic acid  1 mg Oral Daily  . furosemide  20 mg Oral Daily  . gabapentin  600 mg Oral BID  . insulin aspart  0-15 Units Subcutaneous TID WC  . insulin aspart  0-5 Units Subcutaneous QHS  . methotrexate  25 mg Oral Q Wed  . mexiletine  150 mg Oral Q12H  . midodrine   5 mg Oral TID WC  . multivitamin with minerals  1 tablet Oral Daily  . nystatin  5 mL Oral QID  . pantoprazole  80 mg Oral Daily  . polyethylene glycol  17 g Oral Daily  . potassium chloride  20 mEq Oral Daily  . pravastatin  10 mg Oral q1800  . predniSONE  60 mg Oral Q breakfast  . sulfamethoxazole-trimethoprim  1 tablet Oral Once per day on Mon Wed Fri  . venlafaxine XR  150 mg Oral Q breakfast    Infusions: . sodium chloride Stopped (04/25/2020 1455)    PRN Medications: albuterol, oxyCODONE **OR** oxyCODONE    Assessment/Plan   1. Dyspnea--> Septic Emboi  -HS Trop > 4000 -CTA-->Multiple  Nodules ? Septic emboli? Methotrexate lung toxicity.    - Concern for device involvement. Will need TEE + biopsy later today.  - ID consulted  - Blood Cx obtained- NGTD  - Off antibiotics per ID.   2. Chest Pain  -Elevated HS Trop >4000  -Sed Rate 73.  3. Hypotension  -Check blood cultures x2. - Stable today.   -WBC 6.2   4. A/C Systolic Heart Failure 09/4268 LHC with no significant coronaries.  -Cardiac PET in 8/21 was showed active inflammation in the heart, concerning again for cardiac sarcoidosis Repeat PET in 11/21 showed ongoing inflammation but improved.Most recent echo in11/21 with EF35%, but cMRI in 11/21 showed LV EF 27%. - Volume status stable. Continue lasix 20 mg po daily.  Supp K.  -Controlled midodrine 5 mg three times a day.  -Hold HF meds with hypotension.   4. Cardiac Sarcoid -Cardiac Pet and CMRI  Both suggestive of cardiac sarcoid. Sent to [facility] 03/13/20 for possible biopsy and transplant.  Cardiac MRI and cardiac PET both suggestive of cardiac sarcoidosis, and unexplained CHB also concerning, as is VT. She has had VT with prednisone weaning. Suspect isolated cardiac sarcoidosis. Transbronchial biopsy did not show granulomatous disease. Seen at [facility], they agree with diagnosis. Repeat cardiac PET in 11/21 showed cardiac inflammation still present but  improved. -Continue prednisone 60 mg daily - Continue methotrexate once a week, she is on goal dose 25 mg weekly , Wednesday.  Will need to follow CBC, LFTsmonthly.  - On home Bactrim for PJP prophylaxis and folate.Follow K and creatinine closely.  - She is now on infliximab per rheumatology at [facility]. She has had 1 infusion  . 5. H/O VT  Has had multiple morphologies  Interrogate ICD . ATP several different times on 11/23/21and 04/05/20  7000 episodes NSVT on 05/08/2020  Keep K > 4 andd Mag >2  -  Mag 1.5 on admit. Given 4 grams Mag. Mag up to 2.3   -Continue amio 200 mg twice a day  + mexiletine 150 mg twice a day   -EP aware of admit   6. H/O CHB Has CRT  7. Thrush Need to continue diflucan.   8. H/O RIJ DVT Off eliquis and on heparin drip for procedures.  Currently on hold.   9 DMII  Palliative Care consulted  For [place].   Length of Stay: Tal[place], NP  April 20, 2020, 8:17 AM  Advanced Heart Failure Team Pager [phone] (M-F; 7a - 4p)  Please contact [facility] for night-coverage after hours (4p -7a ) and weekends on [email or web address]  Patient seen with NP, agree with the above note.   Still with pleuritic chest pain, no change. Afebrile, cultures so far negative.   General: NAD Neck: No JVD, no thyromegaly or thyroid nodule.  Lungs: Clear to auscultation bilaterally with normal respiratory effort. CV: Nondisplaced PMI.  Heart regular S1/S2, no S3/S4, she sounds like she has a friction rub.  No peripheral edema.   Abdomen: Soft, nontender, no hepatosplenomegaly, no distention.  Skin: Intact without lesions or rashes.  Neurologic: Alert and oriented x 3.  Psych: Normal affect. Extremities: No clubbing or cyanosis.  HEENT: Normal.   1. Pulmonary: Pleuritic chest pain with bilateral innumerable new nodules with cavitation, more generalized consolidation left lung apex.  Concern for septic emboli versus fungal disease, probably less likely methotrexate lung toxicity  (has been immunosuppressed).  Blood cultures negative so far and afebrile.  ESR elevated at 73.  - Per ID, antibiotics on hold until after bronch/biopsy.  - Bronch/biopsy today.   - TEE today to look for vegetation.  She has a device in place.  2. Headache: CT head with no acute findings.  3. Cardiac sarcoidosis: She is followed in the sarcoidosis clinic at [facility].  Cardiac MRI and cardiac PET both suggestive of cardiac sarcoidosis, and unexplained CHB also concerning, as is VT. She has had VT with prednisone weaning. Suspect isolated cardiac sarcoidosis. Transbronchial biopsy did not show granulomatous disease. Seen at [facility], they agree with diagnosis. Repeat cardiac PET in 11/21 showed cardiac inflammation still present but improved. I do not think that CT findings this admit would be likely to be disseminated sarcoidosis given no evident lung activity prior and consistent immunosuppression.  -Continue prednisone 60 mg daily home dose for now.  - Continue methotrexate once a week, she is on goal dose 25 mg weekly => discussed with pulmonary, unlikely to be MTX lung toxicity. - She is on Bactrim for PJP prophylaxis and folate. - She is now on infliximab per rheumatology at [facility], has had 1 infusion. 4. Recurrent VT: Recurrent ventricular tachycardia:She has hadVT with multiple morphologies. No coronary disease on recent cath. Suspect as abovethat she has cardiac sarcoidosis (would also explain CHB). Amiodarone stopped in the past due to torsades and very long QTc on amiodarone. She has tolerated restart of amiodarone. She is also on prednisone as above and has had increased VT when prednisone was tapered.This admission, device interrogation has shown NSVT but not sustained VT.  -She will continue amiodarone 200 mg daily. -Off Coreg with soft BP.  - Continue mexiletine 150 mg bid, can increase if needed. 5. H/o MPN:TIRWERX due to cardiac sarcoidosis. Now s/p CRT.  6. Rt IJ DVT:  likely from prior central line. Planned for 3 months Eliquis, had been planned to stop at the end of this month.  -  Currently on heparin gtt.  7. Chronic systolic CHF: Chronicsystolic CHF: Nonischemic cardiomyopathy. Echoin 7/21with EF newly decreased to 20-25% with septal-lateral dyssynchrony and septal severe hypokinesis, normal RV, moderate TR, moderate MR. Echo in 5/20 prior to PPM placement showed EF 60-65%. She developed shock in 7/21 requiring pressors/inotropes and IABP.LHC/RHC 7/23/21with no significant CAD, elevated filling pressures, and low cardiac output. cMRI 7/27/21and repeated 11/21showed patchy LGE in the basal septum, basal-mid inferoseptum/inferior wall, basal anterior wall, mid anterolateral wall. Cardiac MRI LGE pattern could be consistent with cardiac sarcoidosis (would explain earlier CHB). Alternatively, prior viral myocarditis (recent COVID-19). CT chest with ground glass lower lobes but no evidence for pulmonary sarcoidosis, transbronchial biopsy did not show granulomatous disease. She is now s/p device upgrade to MDT CRT-D.Cardiac PET in 8/21 was showed active inflammation in the heart, concerning again for cardiac sarcoidosis.Repeat PET in 11/21 showed ongoing inflammation but improved.CMRI in 11/21 showed LV EF 27%.Echo this admission with EF 25-30%, normal RV, moderate-severe MR.  NYHA class IIIb with prominent fatigue.  She does not look volume overloaded on exam today.  -She is off spironolactone, Entresto, and Coreg with soft BP.  She is now on midodrine 5 mg tid.  -Continue Lasix 20 mg daily.   - ?low output with prominent fatigue.  She will need eventual RHC.  -Will need TEE for evaluation of for endocarditis/device infection => plan today, discussed risks/benefits with patient and she agrees to procedure.  8. Chest pain: With elevated HS-TnI in the 4000s range without trend.  Pain is pleuritic and likely due to lung pathology.  No PE on CTA.  She had  normal coronaries in 7/21, doubt ACS. However, listening to her heart this morning I am concerned for a pericardial friction rub.  There was not a pericardial effusion on her echo yesterday.  - Will start colchicine 0.6 mg daily for now and follow.  Already on prednisone.  Loralie Champagne 05-03-2020 9:29 AM

## 2020-05-11 NOTE — Progress Notes (Signed)
Pt arrived to short stay, Dr. Lamonte Sakai in room with pt and stated that "pt doesn't look well". Pt in 10/10 pain in lower back and with c/o a headache. Pressures noted to be soft, 91/71 then 69/42. Verbal order for 1mg  of dilaudid for pain per Dr. Lamonte Sakai. Fluids started. Pressures continued to drop. Dilaudid not given. Anesthesia contacted. EKG obtained; showed STEMI. Protocol initiated. Providers at bedside. Multiple orders given. See notes from practitioners.   Jacqlyn Larsen, RN

## 2020-05-11 NOTE — Anesthesia Procedure Notes (Addendum)
Central Venous Catheter Insertion Performed by: Belinda Block, MD, anesthesiologist Start/End01-10-2020 11:00 AM, May 16, 2020 11:15 AM Patient location: Pre-op. Preanesthetic checklist: patient identified, IV checked, site marked, risks and benefits discussed, surgical consent, monitors and equipment checked, pre-op evaluation, timeout performed and anesthesia consent Position: Trendelenburg Lidocaine 1% used for infiltration and patient sedated Hand hygiene performed , maximum sterile barriers used  and Seldinger technique used Catheter size: 8 Fr Total catheter length 16. Central line was placed.Double lumen Procedure performed using ultrasound guided technique. Ultrasound Notes:image(s) printed for medical record Attempts: 1 Following insertion, dressing applied and line sutured. Post procedure assessment: blood return through all ports  Patient tolerated the procedure well with no immediate complications.

## 2020-05-11 DEATH — deceased

## 2020-05-16 LAB — FUNGUS CULTURE WITH STAIN

## 2020-05-16 LAB — FUNGAL ORGANISM REFLEX

## 2020-05-16 LAB — FUNGUS CULTURE RESULT

## 2020-05-22 ENCOUNTER — Ambulatory Visit: Payer: Medicare Other | Admitting: Internal Medicine

## 2020-05-31 LAB — ACID FAST CULTURE WITH REFLEXED SENSITIVITIES (MYCOBACTERIA)
Acid Fast Culture: NEGATIVE
Acid Fast Culture: NEGATIVE

## 2020-06-11 NOTE — Death Summary Note (Signed)
DEATH SUMMARY   Patient Details  Name: Lisa Poole MRN: [id number] DOB: 1966/04/20  Admission/Discharge Information   Admit Date:  04/21/20  Date of Death: Date of Death: Apr 23, 2020  Time of Death: Time of Death: 03-Aug-2251  Length of Stay: 2  Referring Physician: Milford Cage, PA   Reason(s) for Hospitalization  Shortness of breath, rash, hypotension  Diagnoses  Preliminary cause of death:   Cardiogenic Shock  Secondary Diagnoses (including complications and co-morbidities):  Principal Problem:   Cardiogenic shock ([place]) Active Problems:   Elevated troponin   Ventricular tachycardia ([facility])   Chronic HFrEF (heart failure with reduced ejection fraction) ([place])   Cardiac sarcoidosis   Septic embolism ([place])   Multiple pulmonary nodules   Immunosuppressed status ([place])   Hypotension   Brief Hospital Course (including significant findings, care, treatment, and services provided and events leading to death)  Lisa Poole is a 55 y.o. year old female never smoker, with hx presumed cardiac sarcoidosis and associated dysrhythmias (pacer, AICD), HFrEF. She had non-diagnostic transbronchial biopsies in 01/2020. She has been managed on MTX, pred, most recently infliximab. CT's of chest 01/2020 at [facility] and 03/2020 at [facility] have not shown evidence active sarcoidosis, do have calcified mediastinal and hilar nodes.   She presented with dyspnea, rash, relative hypotension. CT-PA Apr 22, 2023 showed scattered numerous round nodules of various sizes, some with evolving cavitation. She was treated for empiric antibiotics given the possibility for opportunistic infection (on immunosuppression), possible infectious embolic process.  She underwent navigational bronchoscopy 04/24/2023 to better evaluate her round cavitating pulmonary nodules.  She also underwent transesophageal echocardiogram that did not show any evidence of infectious valvular endocarditis.  She was transferred to the ICU post bronchoscopy given her  overall hemodynamic instability.  She was able to be extubated post procedure.  She unfortunately experienced acute dyspnea and then unresponsiveness later 04-24-23.  Monitor consistent with PEA, then VT/VF.  Acute labs showed potassium 7.2 which was treated medically.  She was intubated and ventilated.  Underwent multiple rounds of CPR, ultimately ROSC after greater than 20 minutes but profoundly unstable on norepinephrine, epinephrine infusions.  Pupils were fixed, consistent with likely an anoxic injury.  Family was called to bedside and situation was explained.  Family understood that prognosis for survival was very poor.  Based on this she was transitioned to comfort, compassionate extubation performed.  She expired 2020-04-23.  Pertinent Labs and Studies  Significant Diagnostic Studies DG Chest 2 View  Result Date: April 21, 2020 CLINICAL DATA:  Shortness of breath. EXAM: CHEST - 2 VIEW COMPARISON:  Chest x-ray dated March 11, 2020. FINDINGS: Unchanged left chest wall pacemaker. The heart size and mediastinal contours are within normal limits. Mild peripheral patchy airspace and interstitial opacities in the right mid and left lower lungs. No pleural effusion or pneumothorax. No acute osseous abnormality. IMPRESSION: 1. Mild peripheral patchy airspace and interstitial opacities in both lungs could reflect pulmonary edema or atypical infection, including viral pneumonia. Electronically Signed   By: Titus Dubin M.D.   On: 2020/04/21 14:12   CT HEAD WO CONTRAST  Result Date: 04/16/2020 CLINICAL DATA:  Stroke suspected. EXAM: CT HEAD WITHOUT CONTRAST TECHNIQUE: Contiguous axial images were obtained from the base of the skull through the vertex without intravenous contrast. COMPARISON:  None. FINDINGS: Brain: No evidence of acute large vascular territory infarction, hemorrhage, hydrocephalus, extra-axial collection or mass lesion/mass effect. Vascular: Calcific atherosclerosis. Skull: No acute fracture.  Sinuses/Orbits: Inferior left maxillary sinus mucosal thickening. Unremarkable orbits. Other: No  mastoid effusions. IMPRESSION: No evidence of acute intracranial abnormality. Electronically Signed   By: Margaretha Sheffield MD   On: 04/16/2020 10:58   CT Angio Chest PE W and/or Wo Contrast  Addendum Date: 04/20/2020   ADDENDUM REPORT: 05/05/2020 21:09 ADDENDUM: Study discussed by telephone with a provider on the [facility] medicine team on 04/23/2020 at 2100 hours. She advised that patient on methotrexate for cardiac sarcoidosis. We discussed that methotrexate lung toxicity can have an appearance of organizing pneumonia (as might be seen in the left lung apex of this study), but drug toxicity seems less likely than a disseminated infection given both the overall pattern of disease and the patient's immunocompromised state. Blood cultures are pending at this time. Fulminant pulmonary sarcoidosis also seems unlikely given little to no involvement of the lungs in September. Electronically Signed   By: Genevie Ann M.D.   On: 05/07/2020 21:09   Result Date: 04/16/2020 CLINICAL DATA:  55 year old female with shortness of breath, chest pain. History of sarcoidosis, although no significant lung involvement on prior CT. EXAM: CT ANGIOGRAPHY CHEST WITH CONTRAST TECHNIQUE: Multidetector CT imaging of the chest was performed using the standard protocol during bolus administration of intravenous contrast. Multiplanar CT image reconstructions and MIPs were obtained to evaluate the vascular anatomy. CONTRAST:  55mL OMNIPAQUE IOHEXOL 350 MG/ML SOLN COMPARISON:  Chest radiographs earlier today. Chest CT without contrast 02/07/2020. FINDINGS: Cardiovascular: Excellent contrast bolus timing in the pulmonary arterial tree. No focal filling defect identified in the pulmonary arteries to suggest acute pulmonary embolism. Calcified coronary artery atherosclerosis identified on series 6, image 125. Chronic cardiac pacemaker type device from  left subclavian approach again noted. Stable cardiac size since September. No pericardial effusion. Little contrast in the aorta today. Mediastinum/Nodes: Chronic postinflammatory calcified left hilar and AP window lymph nodes are stable. No mediastinal or hilar lymphadenopathy. Lungs/Pleura: New since September widespread abnormal pulmonary opacity scattered in both lungs. Nonenhancing roughly 3 cm area of consolidation suspected in the left lung apex. Innumerable new pulmonary nodules. Diffuse indistinct pulmonary nodules ranging from 2-3 mm to 2.5 cm diameter, and some of the largest nodules are cavitary (left lower lobe series 7, image 51 and anterior right upper lobe image 32. Major airways remain patent. No associated pleural effusion. Upper Abdomen: Scattered calcified granulomas in the visible liver and spleen are stable. Small postinflammatory calcified lymph nodes also redemonstrated at the porta hepatis. Otherwise negative visible largely noncontrast upper abdomen including partially visible pancreas, adrenal glands, renal upper poles and bowel. Musculoskeletal: No acute osseous abnormality identified. Review of the MIP images confirms the above findings. IMPRESSION: 1. Negative for acute pulmonary embolus. 2. However, there are innumerable new bilateral pulmonary nodule since September, the largest of which are cavitary. Some more generalized pulmonary consolidation (left lung apex). No mediastinal lymphadenopathy or pleural effusion. Top differential considerations include septic emboli, and endemic or invasive fungal disease (Aspergillus, Candida, mucormycosis, etc). Recommend blood cultures. 3. Superimposed chronic sequelae of granulomatous disease in the chest and upper abdomen. 4. Calcified coronary artery atherosclerosis. Electronically Signed: By: Genevie Ann M.D. On: 04/14/2020 17:46   DG CHEST PORT 1 VIEW  Addendum Date: 2020-04-20   ADDENDUM REPORT: 2020-04-20 22:17 ADDENDUM: These results were  called by telephone at the time of interpretation on 04-20-20 at 10:16 pm to provider Trevor Mace , who verbally acknowledged these results. Electronically Signed   By: Iven Finn M.D.   On: 2020-04-20 22:17   Result Date: 04/20/20 CLINICAL DATA:  Status post intubation  EXAM: PORTABLE CHEST 1 VIEW COMPARISON:  Chest x-ray May 09, 2020 1:10 p.m. FINDINGS: Interval placement of an endotracheal tube just distal to the carina within the proximal right mainstem bronchus. Left chest wall 3 lead cardiac pacemaker and defibrillator in stable position. No enteric tube identified. Cardiac paddles overlie the patient chest. The heart size and mediastinal contours are unchanged. Diffuse patchy airspace opacities slightly worsened compared to prior chest x-ray. No pulmonary edema. No pleural effusion. No pneumothorax. No acute osseous abnormality. Mild gaseous distension of the gastric lumen. IMPRESSION: 1. Right mainstem bronchus intubation. Recommend retraction of endotracheal tube by 3 cm. 2. Interval worsening of diffuse patchy airspace opacities. Electronically Signed: By: Iven Finn M.D. On: 05/09/20 22:04   DG Chest Port 1 View  Result Date: 2020/05/09 CLINICAL DATA:  Status post bronchoscopy with biopsy. Central line placement. EXAM: PORTABLE CHEST 1 VIEW COMPARISON:  Chest radiographs and CTA 05/05/2020 FINDINGS: An ICD remains in place. A left jugular catheter has been placed and terminates near the brachiocephalic/SVC junction. The cardiac silhouette is upper limits of normal in size. There are calcified left hilar lymph nodes. Lung volumes are low. Compared to the prior radiographs, there is significant interval worsening of areas of both patchy and nodular as well as confluent airspace opacity scattered throughout both lungs. No large pleural effusion or pneumothorax is identified. IMPRESSION: 1. Interval left jugular catheter placement. 2. Worsening bilateral airspace disease concerning for  pneumonia. Electronically Signed   By: Logan Bores M.D.   On: 2020/05/09 13:45   ECHOCARDIOGRAM COMPLETE  Result Date: 04/16/2020    ECHOCARDIOGRAM REPORT   Patient Name:   [name] Date of Exam: 04/16/2020 Medical Rec #:  [id number]      Height:       60.0 in Accession #:    [id number]     Weight:       143.0 lb Date of Birth:  09/08/1965       BSA:          1.619 m Patient Age:    40 years       BP:           99/81 mmHg Patient Gender: F              HR:           106 bpm. Exam Location:  Inpatient Procedure: 2D Echo, Color Doppler and Cardiac Doppler Indications:    Congestive Heart Failure 428.0 / I50.9  History:        Patient has prior history of Echocardiogram examinations, most                 recent 01/30/2020. Defibrillator, Arrythmias:Complete heart                 block; Signs/Symptoms:Dyspnea, Chest Pain and Hypotension.                 Cardiac Sarcoid.  Sonographer:    Darlina Sicilian RDCS Referring Phys: [place]  1. There is no left ventricular thrombus. There is global left ventricular hypokinesis, but the mid septum appears dyskinetic. There is significant left ventricular dyssynchrony (especially apex to base). Left ventricular ejection fraction, by estimation, is 25 to 30%. The left ventricle has severely decreased function. The left ventricle demonstrates regional wall motion abnormalities (see scoring diagram/findings for description). Indeterminate diastolic filling due to E-A fusion.  2. Right ventricular systolic function is normal. The right ventricular size is normal. There  is normal pulmonary artery systolic pressure.  3. Left atrial size was mildly dilated.  4. The mitral valve is normal in structure. Moderate to severe mitral valve regurgitation.  5. The aortic valve is tricuspid. Aortic valve regurgitation is mild to moderate. Mild aortic valve sclerosis is present, with no evidence of aortic valve stenosis.  6. The inferior vena cava is normal in size  with greater than 50% respiratory variability, suggesting right atrial pressure of 3 mmHg. Comparison(s): Prior images reviewed side by side. The left ventricular function is worsened. FINDINGS  Left Ventricle: There is no left ventricular thrombus. There is global left ventricular hypokinesis, but the mid septum appears dyskinetic. There is significant left ventricular dyssynchrony (especially apex to base). Left ventricular ejection fraction,  by estimation, is 25 to 30%. The left ventricle has severely decreased function. The left ventricle demonstrates regional wall motion abnormalities. The left ventricular internal cavity size was normal in size. There is no left ventricular hypertrophy. Abnormal (paradoxical) septal motion, consistent with RV pacemaker. Indeterminate diastolic filling due to E-A fusion. Right Ventricle: The right ventricular size is normal. No increase in right ventricular wall thickness. Right ventricular systolic function is normal. There is normal pulmonary artery systolic pressure. The tricuspid regurgitant velocity is 2.50 m/s, and  with an assumed right atrial pressure of 3 mmHg, the estimated right ventricular systolic pressure is 16.1 mmHg. Left Atrium: Left atrial size was mildly dilated. Right Atrium: Right atrial size was normal in size. Pericardium: There is no evidence of pericardial effusion. Mitral Valve: The mitral valve is normal in structure. Moderate to severe mitral valve regurgitation. Tricuspid Valve: The tricuspid valve is normal in structure. Tricuspid valve regurgitation is mild. Aortic Valve: The aortic valve is tricuspid. Aortic valve regurgitation is mild to moderate. Mild aortic valve sclerosis is present, with no evidence of aortic valve stenosis. Aortic valve mean gradient measures 6.0 mmHg. Aortic valve peak gradient measures 10.8 mmHg. Aortic valve area, by VTI measures 1.52 cm. Pulmonic Valve: The pulmonic valve was normal in structure. Pulmonic valve  regurgitation is not visualized. Aorta: The aortic root and ascending aorta are structurally normal, with no evidence of dilitation. Venous: The inferior vena cava is normal in size with greater than 50% respiratory variability, suggesting right atrial pressure of 3 mmHg. IAS/Shunts: No atrial level shunt detected by color flow Doppler. Additional Comments: A pacer wire is visualized.  LEFT VENTRICLE PLAX 2D LVIDd:         5.20 cm LV PW:         0.60 cm LV IVS:        0.70 cm LVOT diam:     1.80 cm LV SV:         28 LV SV Index:   17 LVOT Area:     2.54 cm  LV Volumes (MOD) LV vol d, MOD A2C: 146.0 ml LV vol d, MOD A4C: 149.0 ml LV vol s, MOD A2C: 79.9 ml LV vol s, MOD A4C: 127.0 ml LV SV MOD A2C:     66.1 ml LV SV MOD A4C:     149.0 ml LV SV MOD BP:      45.5 ml LEFT ATRIUM             Index LA Vol (A2C):   21.5 ml 13.28 ml/m LA Vol (A4C):   20.9 ml 12.91 ml/m LA Biplane Vol: 21.5 ml 13.28 ml/m  AORTIC VALVE AV Area (Vmax):    1.60 cm AV Area (Vmean):  1.74 cm AV Area (VTI):     1.52 cm AV Vmax:           164.00 cm/s AV Vmean:          111.000 cm/s AV VTI:            0.183 m AV Peak Grad:      10.8 mmHg AV Mean Grad:      6.0 mmHg LVOT Vmax:         103.00 cm/s LVOT Vmean:        75.700 cm/s LVOT VTI:          0.109 m LVOT/AV VTI ratio: 0.60  AORTA Ao Root diam: 2.90 cm Ao Asc diam:  3.30 cm TRICUSPID VALVE TR Peak grad:   25.0 mmHg TR Vmax:        250.00 cm/s  SHUNTS Systemic VTI:  0.11 m Systemic Diam: 1.80 cm Sanda Klein MD Electronically signed by Sanda Klein MD Signature Date/Time: 04/16/2020/2:12:40 PM    Final    ECHO TEE  Result Date: 04/27/2020    TRANSESOPHOGEAL ECHO REPORT   Patient Name:   [name] Date of Exam: 05-11-20 Medical Rec #:  [id number]      Height:       60.0 in Accession #:    [id number]     Weight:       156.3 lb Date of Birth:  04-26-1966       BSA:          1.681 m Patient Age:    73 years       BP:           94/73 mmHg Patient Gender: F              HR:            88 bpm. Exam Location:  Inpatient Procedure: Transesophageal Echo, 3D Echo, Cardiac Doppler and Color Doppler Indications:    Endocarditis.  History:        Patient has prior history of Echocardiogram examinations, most                 recent 04/16/2020. Pacemaker and Defibrillator; Risk                 Factors:Hypertension, Dyslipidemia and Diabetes. Complete Heart                 Block.  Sonographer:    Tiffany Dance Referring Phys: [place]: The transesophogeal probe was passed without difficulty through the esophogus of the patient. Sedation performed by different physician. The patient was monitored while under deep sedation., 60mg  of Lidocaine. Patients was under conscious sedation during this procedure. Anesthetic administered: 27mcg of Fentanyl. The patient developed no complications during the procedure. IMPRESSIONS  1. Left ventricular ejection fraction, by estimation, is 25 to 30%. The left ventricle has severely decreased function. The left ventricle demonstrates global hypokinesis. The left ventricular internal cavity size was mildly dilated. No LV thrombus.  2. There was a pacemaker in the right heart, no vegetation visualized on leads. Right ventricular systolic function is mildly reduced. The right ventricular size is normal.  3. Left atrial size was mildly dilated. No left atrial/left atrial appendage thrombus was detected.  4. No PFO or ASD by color doppler.  5. Peak RV-RA gradient 31 mmHg. No Tricuspid valve vegetation.  6. Mild PI, no pulmonary valve vegetation.  7. No aortic valve vegetation. The aortic valve is  tricuspid. Aortic valve regurgitation is mild to moderate. Mild aortic valve sclerosis is present, with no evidence of aortic valve stenosis.  8. There is some restriction of the posterior mitral leaflet with severe MR (PISA ERO 0.44 cm^2). No mitral valve vegetation. Suspect functional mitral regurgitation. There is flattening but not reversal of the pulmonary vein  systolic doppler pattern. No evidence of mitral stenosis.  9. Normal caliber thoracic aorta with minimal plaque. FINDINGS  Left Ventricle: Left ventricular ejection fraction, by estimation, is 25 to 30%. The left ventricle has severely decreased function. The left ventricle demonstrates global hypokinesis. The left ventricular internal cavity size was mildly dilated. Right Ventricle: There was a pacemaker in the right heart, no vegetation visualized on leads. The right ventricular size is normal. No increase in right ventricular wall thickness. Right ventricular systolic function is mildly reduced. Left Atrium: Left atrial size was mildly dilated. No left atrial/left atrial appendage thrombus was detected. Right Atrium: Right atrial size was normal in size. Pericardium: Trivial pericardial effusion is present. Mitral Valve: There is some restriction of the posterior mitral leaflet with severe MR (PISA ERO 0.44 cm^2). No mitral valve vegetation. Suspect functional mitral regurgitation. There is flattening but not reversal of the pulmonary vein systolic doppler pattern. The mitral valve is abnormal. Severe mitral valve regurgitation. No evidence of mitral valve stenosis. Tricuspid Valve: Peak RV-RA gradient 31 mmHg. No Tricuspid valve vegetation. The tricuspid valve is normal in structure. Tricuspid valve regurgitation is mild. Aortic Valve: No aortic valve vegetation. The aortic valve is tricuspid. Aortic valve regurgitation is mild to moderate. Mild aortic valve sclerosis is present, with no evidence of aortic valve stenosis. Pulmonic Valve: Mild PI, no pulmonary valve vegetation. The pulmonic valve was normal in structure. Pulmonic valve regurgitation is mild. Aorta: Normal caliber thoracic aorta with minimal plaque. The aortic root is normal in size and structure. IAS/Shunts: No PFO or ASD by color doppler.  MR Peak grad:    109.4 mmHg  TRICUSPID VALVE MR Mean grad:    57.0 mmHg   TR Peak grad:   30.9 mmHg MR  Vmax:         523.00 cm/s TR Vmax:        278.00 cm/s MR Vmean:        340.0 cm/s MR PISA:         4.02 cm MR PISA Eff ROA: 30 mm MR PISA Radius:  0.80 cm Loralie Champagne MD Electronically signed by Loralie Champagne MD Signature Date/Time: 05/04/2020/2:35:48 PM    Final    DG C-ARM BRONCHOSCOPY  Result Date: 04/30/20 C-ARM BRONCHOSCOPY: Fluoroscopy was utilized by the requesting physician.  No radiographic interpretation.     Rose Fillers Shalik Sanfilippo 05/14/2020, 12:43 AM

## 2020-06-25 ENCOUNTER — Encounter: Payer: Medicare Other | Admitting: Cardiology

## 2021-07-03 IMAGING — MR MR CARD MORPHOLOGY WO/W CM
42 of 45 series · 42 of 45 positions shown · IV contrast (Contrast agent)
Comparison: none

CLINICAL DATA: Cardiomyopathy of uncertain etiology

EXAM:
CARDIAC MRI
TECHNIQUE: The patient was scanned on a 1.5 Tesla GE magnet. A dedicated
cardiac coil was used. Functional imaging was done using [employer]
sequences. [DATE], and 4 chamber views were done to assess for RWMA's.
Modified Valetta rule using a short axis stack was used to
calculate an ejection fraction on a dedicated work station using
[employer] software. The patient received 8 cc of Gadavist. After 10
minutes inversion recovery sequences were used to assess for
infiltration and scar tissue.
CONTRAST:  Gadavist 8 cc

[Series 4: [id number] · axial · 8.0mm · 1.37mm/px · 1 of 16 slices shown]
[im 1/16]
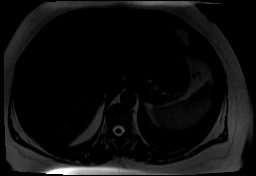

[Series 8: bSSFP · oblique · 8.0mm · 1.61mm/px · 1 of 25 slices shown (1 of 23)]
[im 1/25]
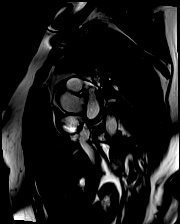

[Series 9: bSSFP · oblique · 8.0mm · 1.61mm/px · 1 of 25 slices shown (2 of 23)]
[im 1/25]
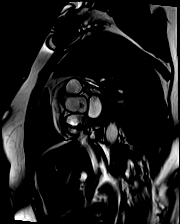

[Series 10: bSSFP · oblique · 8.0mm · 1.61mm/px · 1 of 25 slices shown (3 of 23)]
[im 1/25]
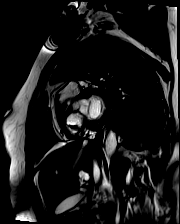

[Series 11: bSSFP · oblique · 8.0mm · 1.61mm/px · 1 of 25 slices shown (4 of 23)]
[im 1/25]
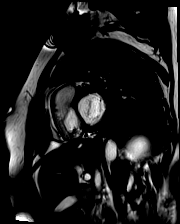

[Series 12: bSSFP · oblique · 8.0mm · 1.61mm/px · 1 of 25 slices shown (5 of 23)]
[im 1/25]
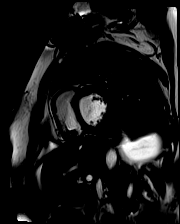

[Series 13: bSSFP · oblique · 8.0mm · 1.61mm/px · 1 of 25 slices shown (6 of 23)]
[im 1/25]
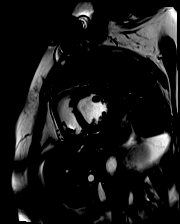

[Series 14: bSSFP · oblique · 8.0mm · 1.61mm/px · 1 of 25 slices shown (7 of 23)]
[im 1/25]
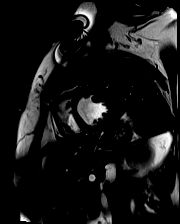

[Series 15: bSSFP · oblique · 8.0mm · 1.61mm/px · 1 of 25 slices shown (8 of 23)]
[im 1/25]
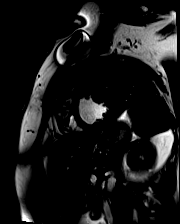

[Series 16: bSSFP · oblique · 8.0mm · 1.61mm/px · 1 of 25 slices shown (9 of 23)]
[im 1/25]
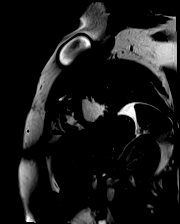

[Series 17: bSSFP · oblique · 8.0mm · 1.61mm/px · 1 of 25 slices shown (10 of 23)]
[im 1/25]
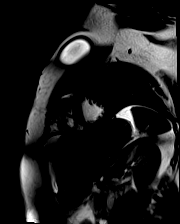

[Series 18: bSSFP · oblique · 8.0mm · 1.61mm/px · 1 of 25 slices shown (11 of 23)]
[im 1/25]
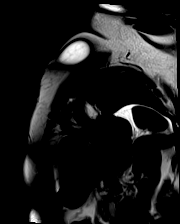

[Series 19: bSSFP · oblique · 8.0mm · 1.61mm/px · 1 of 25 slices shown (12 of 23)]
[im 1/25]
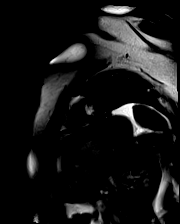

[Series 20: bSSFP · oblique · 8.0mm · 1.61mm/px · 1 of 25 slices shown (13 of 23)]
[im 1/25]
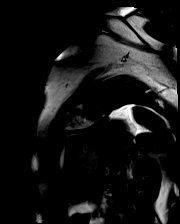

[Series 21: bSSFP · oblique · 8.0mm · 1.61mm/px · 1 of 25 slices shown (14 of 23)]
[im 1/25]
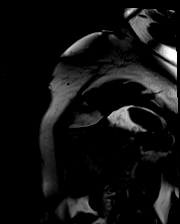

[Series 22: bSSFP · oblique · 8.0mm · 1.61mm/px · 1 of 25 slices shown (15 of 23)]
[im 1/25]
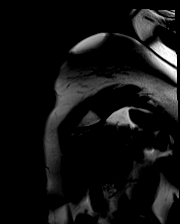

[Series 23: bSSFP · oblique · 6.0mm · 1.41mm/px · 1 of 25 slices shown (16 of 23)]
[im 1/25]
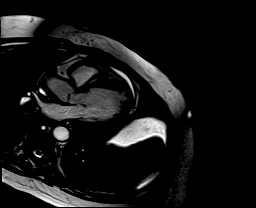

[Series 24: bSSFP · oblique · 6.0mm · 1.41mm/px · 1 of 25 slices shown (17 of 23)]
[im 1/25]
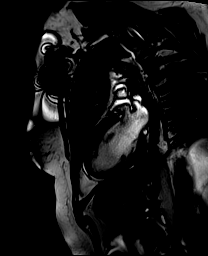

[Series 25: bSSFP · axial · 6.0mm · 1.41mm/px · 1 of 25 slices shown (18 of 23)]
[im 1/25]
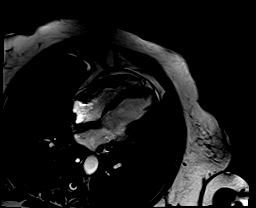

[Series 26: (id)_long_t1 · oblique · 8.0mm · 1.88mm/px · 1 of 24 slices shown]
[im 1/24]
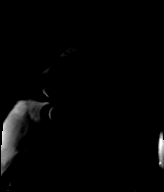

[Series 27: (id)_long_t1_moco · oblique · 8.0mm · 1.88mm/px · 1 of 24 slices shown]
[im 1/24]
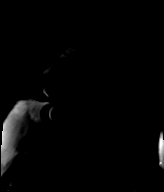

[Series 28: (id)_long_t1_moco_t1 · oblique · 8.0mm · 1.88mm/px · 1 of 6 slices shown]
[im 1/6]
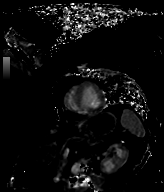

[Series 30: (id)_trufi · oblique · 8.0mm · 1.88mm/px · 1 of 9 slices shown]
[im 1/9]
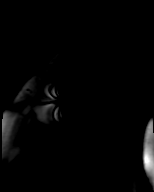

[Series 31: (id)_trufi_moco · oblique · 8.0mm · 1.88mm/px · 1 of 9 slices shown]
[im 1/9]
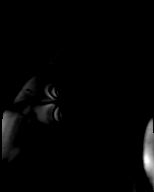

[Series 32: (id)_trufi_moco_t2 · oblique · 8.0mm · 1.88mm/px · 1 of 3 slices shown]
[im 1/3]
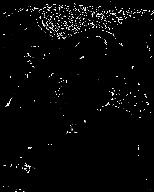

[Series 34: bSSFP · coronal · 6.0mm · 1.41mm/px · 1 of 25 slices shown (19 of 23)]
[im 1/25]
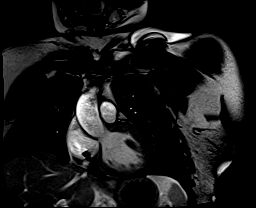

[Series 35: cine rvit · oblique · 6.0mm · 1.41mm/px · 1 of 25 slices shown]
[im 1/25]
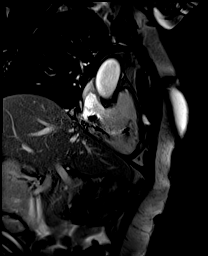

[Series 36: aortic valve cine · oblique · 6.0mm · 1.41mm/px · 1 of 25 slices shown]
[im 1/25]
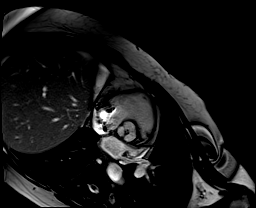

[Series 37: cine rvot · sagittal · 6.0mm · 1.41mm/px · 1 of 25 slices shown]
[im 1/25]
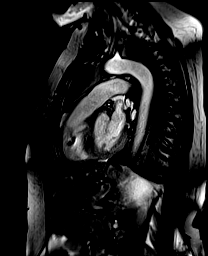

[Series 39: lge_single shot sa · oblique · 8.0mm · 1.98mm/px · 1 of 10 slices shown (1 of 2)]
[im 1/10]
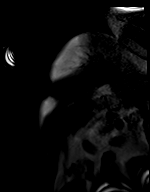

[Series 40: lge_single shot sa · oblique · 8.0mm · 1.98mm/px · 1 of 10 slices shown (2 of 2)]
[im 1/10]
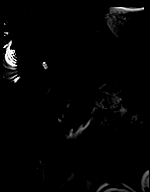

[Series 43: lge_single shot 4 · axial · 6.0mm · 1.98mm/px · 1 of 1 slices shown (1 of 2)]
[im 1/1]
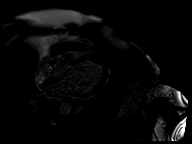

[Series 44: lge_single shot 4 · axial · 6.0mm · 1.98mm/px · 1 of 1 slices shown (2 of 2)]
[im 1/1]
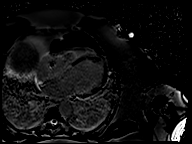

[Series 45: lge_single shot 3 · axial · 6.0mm · 1.98mm/px · 1 of 1 slices shown (1 of 2)]
[im 1/1]
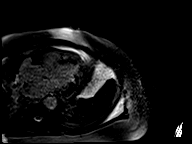

[Series 46: lge_single shot 3 · axial · 6.0mm · 1.98mm/px · 1 of 1 slices shown (2 of 2)]
[im 1/1]
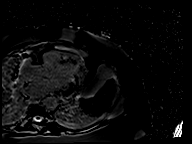

[Series 47: (id)_short_t1 · oblique · 8.0mm · 1.88mm/px · 1 of 27 slices shown]
[im 1/27]
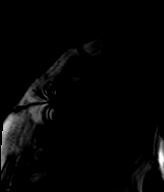

[Series 48: (id)_short_t1_moco · oblique · 8.0mm · 1.88mm/px · 1 of 27 slices shown]
[im 1/27]
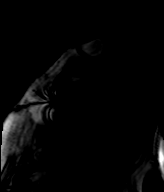

[Series 49: (id)_short_t1_moco_t1 · oblique · 8.0mm · 1.88mm/px · 1 of 6 slices shown]
[im 1/6]
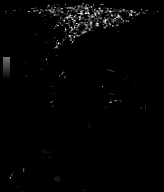

[Series 52: bSSFP · oblique · 6.0mm · 1.73mm/px · 1 of 18 slices shown (20 of 23)]
[im 1/18]
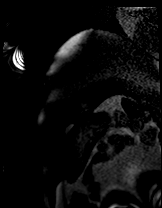

[Series 53: bSSFP · oblique · 6.0mm · 1.73mm/px · 1 of 18 slices shown (21 of 23)]
[im 1/18]
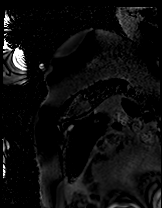

[Series 56: bSSFP · axial · 6.0mm · 1.73mm/px · 1 of 12 slices shown (22 of 23)]
[im 1/12]
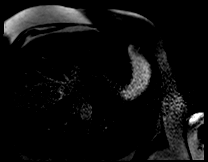

[Series 57: bSSFP · axial · 6.0mm · 1.73mm/px · 1 of 12 slices shown (23 of 23)]
[im 1/12]
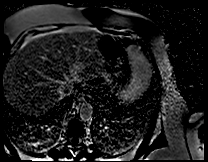

[42 of 45 positions shown; findings below may reference images not displayed]

FINDINGS: Limited images of the lung fields showed no gross abnormalities.

Normal left ventricular size and wall thickness. The basal septal
wall was thin. The septal wall was severely hypokinetic with
septal-lateral dyssynchrony. The lateral wall was relatively
preserved with hypokinesis of the inferior and anterior walls. LV EF
calculated at 30%. There was a pacemaker in the right ventricle.
Normal right ventricular size with EF 27%. Normal left and right
atrial sizes. Mildly thickened, trileaflet aortic valve, no
significant regurgitation or stenosis. No significant mitral
regurgitation noted.

Delayed enhancement imaging: The basal septal wall was thinned with
a small area of near-full thickness late gadolinium enhancement
(LGE). There was patchy, primarily mid-wall basal to mid
inferoseptal and inferior LGE. Small areas of mid-wall basal
anterior and mid anterolateral LGE.

Measurements:

LVEDV 123 mL

LVSV 37 mL

LVEF 30%

RVEDV 89 mL

RVSV 24 mL

RVEF 27%
IMPRESSION: 1. Normal LV size with multiple wall motion abnormalities. The
septum was relatively thin with severe hypokinesis and
septal-lateral dyssynchrony, the anterior and inferior walls were
hypokinetic. LV EF 30%.

2. Normal RV size with moderate to severely decreased systolic
function, EF 27%. Pacemaker in RV.

3. Non-coronary LGE pattern. Patchy LGE in the basal septum,
basal-mid inferoseptum/inferior wall, basal anterior wall, mid
anterolateral wall. This could be consistent with cardiac
sarcoidosis. Alternatively, could see similar pattern with prior
myocarditis.

Jayvon Judy
